# Patient Record
Sex: Male | Born: 1940 | Race: White | Hispanic: No | Marital: Married | Smoking: Former smoker
Health system: Southern US, Community
[De-identification: ages and names within clinical notes are randomized; demographics above are authoritative.]

## PROBLEM LIST (undated history)

## (undated) DIAGNOSIS — G8929 Other chronic pain: Secondary | ICD-10-CM

## (undated) DIAGNOSIS — R7303 Prediabetes: Secondary | ICD-10-CM

## (undated) DIAGNOSIS — R0609 Other forms of dyspnea: Secondary | ICD-10-CM

## (undated) DIAGNOSIS — K219 Gastro-esophageal reflux disease without esophagitis: Secondary | ICD-10-CM

## (undated) DIAGNOSIS — N529 Male erectile dysfunction, unspecified: Secondary | ICD-10-CM

## (undated) DIAGNOSIS — K580 Irritable bowel syndrome with diarrhea: Secondary | ICD-10-CM

## (undated) DIAGNOSIS — R3912 Poor urinary stream: Secondary | ICD-10-CM

## (undated) DIAGNOSIS — R51 Headache: Secondary | ICD-10-CM

## (undated) DIAGNOSIS — M75101 Unspecified rotator cuff tear or rupture of right shoulder, not specified as traumatic: Secondary | ICD-10-CM

## (undated) DIAGNOSIS — I5032 Chronic diastolic (congestive) heart failure: Secondary | ICD-10-CM

## (undated) DIAGNOSIS — R0789 Other chest pain: Secondary | ICD-10-CM

## (undated) DIAGNOSIS — K449 Diaphragmatic hernia without obstruction or gangrene: Secondary | ICD-10-CM

## (undated) DIAGNOSIS — H547 Unspecified visual loss: Secondary | ICD-10-CM

## (undated) DIAGNOSIS — C801 Malignant (primary) neoplasm, unspecified: Secondary | ICD-10-CM

## (undated) DIAGNOSIS — Z8669 Personal history of other diseases of the nervous system and sense organs: Secondary | ICD-10-CM

## (undated) DIAGNOSIS — J449 Chronic obstructive pulmonary disease, unspecified: Secondary | ICD-10-CM

## (undated) DIAGNOSIS — E782 Mixed hyperlipidemia: Secondary | ICD-10-CM

## (undated) DIAGNOSIS — I2510 Atherosclerotic heart disease of native coronary artery without angina pectoris: Secondary | ICD-10-CM

## (undated) DIAGNOSIS — Z974 Presence of external hearing-aid: Secondary | ICD-10-CM

## (undated) DIAGNOSIS — N400 Enlarged prostate without lower urinary tract symptoms: Secondary | ICD-10-CM

## (undated) DIAGNOSIS — M51379 Other intervertebral disc degeneration, lumbosacral region without mention of lumbar back pain or lower extremity pain: Secondary | ICD-10-CM

## (undated) DIAGNOSIS — Z85828 Personal history of other malignant neoplasm of skin: Secondary | ICD-10-CM

## (undated) DIAGNOSIS — M1990 Unspecified osteoarthritis, unspecified site: Secondary | ICD-10-CM

## (undated) DIAGNOSIS — E785 Hyperlipidemia, unspecified: Secondary | ICD-10-CM

## (undated) DIAGNOSIS — Z87828 Personal history of other (healed) physical injury and trauma: Secondary | ICD-10-CM

## (undated) DIAGNOSIS — C4490 Unspecified malignant neoplasm of skin, unspecified: Secondary | ICD-10-CM

## (undated) DIAGNOSIS — R9431 Abnormal electrocardiogram [ECG] [EKG]: Secondary | ICD-10-CM

## (undated) DIAGNOSIS — Z87898 Personal history of other specified conditions: Secondary | ICD-10-CM

## (undated) DIAGNOSIS — N401 Enlarged prostate with lower urinary tract symptoms: Secondary | ICD-10-CM

## (undated) DIAGNOSIS — R519 Headache, unspecified: Secondary | ICD-10-CM

## (undated) DIAGNOSIS — I509 Heart failure, unspecified: Secondary | ICD-10-CM

## (undated) DIAGNOSIS — Z860101 Personal history of adenomatous and serrated colon polyps: Secondary | ICD-10-CM

## (undated) DIAGNOSIS — M5137 Other intervertebral disc degeneration, lumbosacral region: Secondary | ICD-10-CM

## (undated) DIAGNOSIS — Z86010 Personal history of colonic polyps: Secondary | ICD-10-CM

## (undated) HISTORY — PX: KNEE ARTHROSCOPY: SUR90

## (undated) HISTORY — DX: Chronic obstructive pulmonary disease, unspecified: J44.9

## (undated) HISTORY — PX: OTHER SURGICAL HISTORY: SHX169

## (undated) HISTORY — DX: Unspecified visual loss: H54.7

## (undated) HISTORY — PX: ELBOW SURGERY: SHX618

## (undated) HISTORY — PX: CARDIAC CATHETERIZATION: SHX172

## (undated) HISTORY — PX: KNEE SURGERY: SHX244

## (undated) HISTORY — PX: CORONARY ARTERY BYPASS GRAFT: SHX141

## (undated) HISTORY — DX: Abnormal electrocardiogram (ECG) (EKG): R94.31

## (undated) HISTORY — PX: APPENDECTOMY: SHX54

## (undated) HISTORY — DX: Benign prostatic hyperplasia without lower urinary tract symptoms: N40.0

---
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
## 1986-09-16 DIAGNOSIS — Z87820 Personal history of traumatic brain injury: Secondary | ICD-10-CM

## 1986-09-16 HISTORY — DX: Personal history of traumatic brain injury: Z87.820

## 2001-09-16 HISTORY — PX: SHOULDER ARTHROSCOPY WITH OPEN ROTATOR CUFF REPAIR: SHX6092

## 2001-09-16 HISTORY — PX: SHOULDER ARTHROSCOPY WITH DISTAL CLAVICLE RESECTION: SHX5675

## 2010-09-16 HISTORY — PX: FINGER TENDON REPAIR: SHX1640

## 2011-11-11 DIAGNOSIS — Z0000 Encounter for general adult medical examination without abnormal findings: Secondary | ICD-10-CM | POA: Diagnosis not present

## 2011-11-13 DIAGNOSIS — I9589 Other hypotension: Secondary | ICD-10-CM | POA: Diagnosis not present

## 2011-11-13 DIAGNOSIS — R6889 Other general symptoms and signs: Secondary | ICD-10-CM | POA: Diagnosis not present

## 2011-11-13 DIAGNOSIS — Z0000 Encounter for general adult medical examination without abnormal findings: Secondary | ICD-10-CM | POA: Diagnosis not present

## 2011-11-13 DIAGNOSIS — R000 Tachycardia, unspecified: Secondary | ICD-10-CM | POA: Diagnosis not present

## 2011-11-13 DIAGNOSIS — F17200 Nicotine dependence, unspecified, uncomplicated: Secondary | ICD-10-CM | POA: Diagnosis not present

## 2011-11-13 DIAGNOSIS — Z23 Encounter for immunization: Secondary | ICD-10-CM | POA: Diagnosis not present

## 2011-11-19 DIAGNOSIS — Z87891 Personal history of nicotine dependence: Secondary | ICD-10-CM | POA: Diagnosis not present

## 2011-11-19 DIAGNOSIS — R6889 Other general symptoms and signs: Secondary | ICD-10-CM | POA: Diagnosis not present

## 2012-03-09 ENCOUNTER — Ambulatory Visit (INDEPENDENT_AMBULATORY_CARE_PROVIDER_SITE_OTHER): Payer: Federal, State, Local not specified - PPO | Admitting: Family Medicine

## 2012-03-09 ENCOUNTER — Encounter: Payer: Self-pay | Admitting: Family Medicine

## 2012-03-09 VITALS — BP 114/74 | HR 85 | Resp 16 | Ht 71.0 in | Wt 164.8 lb

## 2012-03-09 DIAGNOSIS — F17200 Nicotine dependence, unspecified, uncomplicated: Secondary | ICD-10-CM | POA: Diagnosis not present

## 2012-03-09 DIAGNOSIS — N400 Enlarged prostate without lower urinary tract symptoms: Secondary | ICD-10-CM | POA: Diagnosis not present

## 2012-03-09 DIAGNOSIS — Z720 Tobacco use: Secondary | ICD-10-CM

## 2012-03-09 DIAGNOSIS — H269 Unspecified cataract: Secondary | ICD-10-CM

## 2012-03-09 MED ORDER — TAMSULOSIN HCL 0.4 MG PO CAPS: 0.4000 mg | ORAL_CAPSULE | Freq: Every day | ORAL | Status: DC

## 2012-03-09 NOTE — Assessment & Plan Note (Signed)
Continue Flomax. I will obtain his records and labs for the past year from his previous physician

## 2012-03-09 NOTE — Assessment & Plan Note (Signed)
Referral to be made to ophthalmology.

## 2012-03-09 NOTE — Assessment & Plan Note (Signed)
He is not ready to quit tobacco. He states he has quit for a few months at a time using patches and other home remedies.

## 2012-03-09 NOTE — Patient Instructions (Signed)
Continue current medication  I will get records  F/U 1 year
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
## 2012-04-02 DIAGNOSIS — H4720 Unspecified optic atrophy: Secondary | ICD-10-CM | POA: Diagnosis not present

## 2012-04-02 DIAGNOSIS — H2589 Other age-related cataract: Secondary | ICD-10-CM | POA: Diagnosis not present

## 2012-04-02 DIAGNOSIS — H5200 Hypermetropia, unspecified eye: Secondary | ICD-10-CM | POA: Diagnosis not present

## 2012-04-02 DIAGNOSIS — H52229 Regular astigmatism, unspecified eye: Secondary | ICD-10-CM | POA: Diagnosis not present

## 2012-07-01 ENCOUNTER — Ambulatory Visit (INDEPENDENT_AMBULATORY_CARE_PROVIDER_SITE_OTHER): Payer: Medicare Other

## 2012-07-01 DIAGNOSIS — Z23 Encounter for immunization: Secondary | ICD-10-CM

## 2012-07-20 DIAGNOSIS — H2589 Other age-related cataract: Secondary | ICD-10-CM | POA: Diagnosis not present

## 2012-07-20 DIAGNOSIS — H547 Unspecified visual loss: Secondary | ICD-10-CM | POA: Diagnosis not present

## 2012-07-20 DIAGNOSIS — S04049A Injury of visual cortex, unspecified eye, initial encounter: Secondary | ICD-10-CM | POA: Diagnosis not present

## 2012-07-20 DIAGNOSIS — D3130 Benign neoplasm of unspecified choroid: Secondary | ICD-10-CM | POA: Diagnosis not present

## 2012-07-21 ENCOUNTER — Encounter (HOSPITAL_COMMUNITY): Payer: Self-pay | Admitting: Pharmacy Technician

## 2012-07-22 NOTE — Patient Instructions (Signed)
Your procedure is scheduled on:  07/27/12  Report to [facility] at 08:00 AM.  Call this number if you have problems the morning of surgery: [phone]   Remember:   Do not eat or drink:After Midnight.  Take these medicines the morning of surgery with A SIP OF WATER: Flomax.   Do not wear jewelry, make-up or nail polish.  Do not wear lotions, powders, or perfumes. You may wear deodorant.  Do not shave 48 hours prior to surgery. Men may shave face and neck.  Do not bring valuables to the hospital.  Contacts, dentures or bridgework may not be worn into surgery.  Leave suitcase in the car. After surgery it may be brought to your room.  For patients admitted to the hospital, checkout time is 11:00 AM the day of discharge.   Patients discharged the day of surgery will not be allowed to drive home.    Special Instructions: Start using your eye drops before surgery as directed by your eye doctor.   Please read over the following fact sheets that you were given: Anesthesia Post-op Instructions    Cataract Surgery  A cataract is a clouding of the lens of the eye. When a lens becomes cloudy, vision is reduced based on the degree and nature of the clouding. Surgery may be needed to improve vision. Surgery removes the cloudy lens and usually replaces it with a substitute lens (intraocular lens, IOL). LET YOUR EYE DOCTOR KNOW ABOUT:  Allergies to food or medicine.  Medicines taken including herbs, eyedrops, over-the-counter medicines, and creams.  Use of steroids (by mouth or creams).  Previous problems with anesthetics or numbing medicine.  History of bleeding problems or blood clots.  Previous surgery.  Other health problems, including diabetes and kidney problems.  Possibility of pregnancy, if this applies. RISKS AND COMPLICATIONS  Infection.  Inflammation of the eyeball (endophthalmitis) that can spread to both eyes (sympathetic ophthalmia).  Poor wound healing.  If an IOL is  inserted, it can later fall out of proper position. This is very uncommon.  Clouding of the part of your eye that holds an IOL in place. This is called an "after-cataract." These are uncommon, but easily treated. BEFORE THE PROCEDURE  Do not eat or drink anything except small amounts of water for 8 to 12 before your surgery, or as directed by your caregiver.  Unless you are told otherwise, continue any eyedrops you have been prescribed.  Talk to your primary caregiver about all other medicines that you take (both prescription and non-prescription). In some cases, you may need to stop or change medicines near the time of your surgery. This is most important if you are taking blood-thinning medicine.Do not stop medicines unless you are told to do so.  Arrange for someone to drive you to and from the procedure.  Do not put contact lenses in either eye on the day of your surgery. PROCEDURE There is more than one method for safely removing a cataract. Your doctor can explain the differences and help determine which is best for you. Phacoemulsification surgery is the most common form of cataract surgery.  An injection is given behind the eye or eyedrops are given to make this a painless procedure.  A small cut (incision) is made on the edge of the clear, dome-shaped surface that covers the front of the eye (cornea).  A tiny probe is painlessly inserted into the eye. This device gives off ultrasound waves that soften and break up the cloudy  center of the lens. This makes it easier for the cloudy lens to be removed by suction.  An IOL may be implanted.  The normal lens of the eye is covered by a clear capsule. Part of that capsule is intentionally left in the eye to support the IOL.  Your surgeon may or may not use stitches to close the incision. There are other forms of cataract surgery that require a larger incision and stiches to close the eye. This approach is taken in cases where the  doctor feels that the cataract cannot be easily removed using phacoemulsification. AFTER THE PROCEDURE  When an IOL is implanted, it does not need care. It becomes a permanent part of your eye and cannot be seen or felt.  Your doctor will schedule follow-up exams to check on your progress.  Review your other medicines with your doctor to see which can be resumed after surgery.  Use eyedrops or take medicine as prescribed by your doctor. Document Released: 08/22/2011 Document Revised: 11/25/2011 Document Reviewed: 08/22/2011 [facility] Patient Information 2013 [place], [place].    PATIENT INSTRUCTIONS POST-ANESTHESIA  IMMEDIATELY FOLLOWING SURGERY:  Do not drive or operate machinery for the first twenty four hours after surgery.  Do not make any important decisions for twenty four hours after surgery or while taking narcotic pain medications or sedatives.  If you develop intractable nausea and vomiting or a severe headache please notify your doctor immediately.  FOLLOW-UP:  Please make an appointment with your surgeon as instructed. You do not need to follow up with anesthesia unless specifically instructed to do so.  WOUND CARE INSTRUCTIONS (if applicable):  Keep a dry clean dressing on the anesthesia/puncture wound site if there is drainage.  Once the wound has quit draining you may leave it open to air.  Generally you should leave the bandage intact for twenty four hours unless there is drainage.  If the epidural site drains for more than 36-48 hours please call the anesthesia department.  QUESTIONS?:  Please feel free to call your physician or the hospital operator if you have any questions, and they will be happy to assist you.

## 2012-07-23 ENCOUNTER — Encounter (HOSPITAL_COMMUNITY): Payer: Self-pay

## 2012-07-23 ENCOUNTER — Encounter (HOSPITAL_COMMUNITY)
Admission: RE | Admit: 2012-07-23 | Discharge: 2012-07-23 | Disposition: A | Discharge status: Home / Self Care | Payer: Federal, State, Local not specified - PPO | Source: Ambulatory Visit | Attending: Ophthalmology | Admitting: Ophthalmology

## 2012-07-23 HISTORY — DX: Headache: R51

## 2012-07-23 HISTORY — DX: Unspecified osteoarthritis, unspecified site: M19.90

## 2012-07-23 LAB — BASIC METABOLIC PANEL
BUN: 15 mg/dL (ref 6–23)
Calcium: 9.5 mg/dL (ref 8.4–10.5)
Creatinine, Ser: 0.89 mg/dL (ref 0.50–1.35)
GFR calc Af Amer: >90 mL/min (ref >90)
GFR calc non Af Amer: 85 mL/min — ABNORMAL LOW (ref >90)
Glucose, Bld: 130 mg/dL — ABNORMAL HIGH (ref 70–99)
Potassium: 4.3 mEq/L (ref 3.5–5.1)

## 2012-07-27 ENCOUNTER — Encounter (HOSPITAL_COMMUNITY): Payer: Self-pay | Admitting: *Deleted

## 2012-07-27 ENCOUNTER — Ambulatory Visit (HOSPITAL_COMMUNITY)
Admission: RE | Admit: 2012-07-27 | Discharge: 2012-07-27 | Disposition: A | Discharge status: Home / Self Care | Payer: Federal, State, Local not specified - PPO | Source: Ambulatory Visit | Attending: Ophthalmology | Admitting: Ophthalmology

## 2012-07-27 ENCOUNTER — Ambulatory Visit (HOSPITAL_COMMUNITY): Payer: Federal, State, Local not specified - PPO | Admitting: Anesthesiology

## 2012-07-27 ENCOUNTER — Encounter (HOSPITAL_COMMUNITY)
Admission: RE | Disposition: A | Discharge status: Home / Self Care | Payer: Self-pay | Source: Ambulatory Visit | Attending: Ophthalmology

## 2012-07-27 ENCOUNTER — Encounter (HOSPITAL_COMMUNITY): Payer: Self-pay | Admitting: Anesthesiology

## 2012-07-27 DIAGNOSIS — H2589 Other age-related cataract: Secondary | ICD-10-CM | POA: Diagnosis not present

## 2012-07-27 DIAGNOSIS — H2181 Floppy iris syndrome: Secondary | ICD-10-CM | POA: Insufficient documentation

## 2012-07-27 DIAGNOSIS — Z01812 Encounter for preprocedural laboratory examination: Secondary | ICD-10-CM | POA: Insufficient documentation

## 2012-07-27 DIAGNOSIS — H52229 Regular astigmatism, unspecified eye: Secondary | ICD-10-CM | POA: Insufficient documentation

## 2012-07-27 DIAGNOSIS — H269 Unspecified cataract: Secondary | ICD-10-CM | POA: Diagnosis not present

## 2012-07-27 HISTORY — PX: CATARACT EXTRACTION W/PHACO: SHX586

## 2012-07-27 SURGERY — PHACOEMULSIFICATION, CATARACT, WITH IOL INSERTION
Anesthesia: Monitor Anesthesia Care | Site: Eye | Laterality: Right | Wound class: Clean

## 2012-07-27 MED ORDER — LIDOCAINE HCL (PF) 1 % IJ SOLN
INTRAMUSCULAR | Status: AC
  Filled 2012-07-27: qty 2

## 2012-07-27 MED ORDER — EPINEPHRINE HCL 1 MG/ML IJ SOLN
INTRAMUSCULAR | Status: AC
  Filled 2012-07-27: qty 1

## 2012-07-27 MED ORDER — LIDOCAINE 3.5 % OP GEL OPTIME - NO CHARGE
OPHTHALMIC | Status: DC | PRN
  Administered 2012-07-27: 2 [drp] via OPHTHALMIC

## 2012-07-27 MED ORDER — NEOMYCIN-POLYMYXIN-DEXAMETH 3.5-10000-0.1 OP OINT
TOPICAL_OINTMENT | OPHTHALMIC | Status: AC
  Filled 2012-07-27: qty 3.5

## 2012-07-27 MED ORDER — TETRACAINE HCL 0.5 % OP SOLN
1.0000 [drp] | OPHTHALMIC | Status: AC
  Administered 2012-07-27 (×3): 1 [drp] via OPHTHALMIC

## 2012-07-27 MED ORDER — BSS IO SOLN
INTRAOCULAR | Status: DC | PRN
  Administered 2012-07-27: 15 mL via INTRAOCULAR

## 2012-07-27 MED ORDER — PHENYLEPHRINE HCL 2.5 % OP SOLN
OPHTHALMIC | Status: AC
  Filled 2012-07-27: qty 2

## 2012-07-27 MED ORDER — LIDOCAINE HCL (PF) 1 % IJ SOLN
INTRAOCULAR | Status: DC | PRN
  Administered 2012-07-27: 10:00:00 via OPHTHALMIC

## 2012-07-27 MED ORDER — LACTATED RINGERS IV SOLN
INTRAVENOUS | Status: DC
  Administered 2012-07-27: 08:00:00 via INTRAVENOUS

## 2012-07-27 MED ORDER — LACTATED RINGERS IV SOLN: INTRAVENOUS | Status: DC

## 2012-07-27 MED ORDER — PROVISC 10 MG/ML IO SOLN
INTRAOCULAR | Status: DC | PRN
  Administered 2012-07-27: 8.5 mg via INTRAOCULAR

## 2012-07-27 MED ORDER — ACETAMINOPHEN 325 MG PO TABS: 650.0000 mg | ORAL_TABLET | Freq: Once | ORAL | Status: DC

## 2012-07-27 MED ORDER — LIDOCAINE HCL 3.5 % OP GEL
1.0000 "application " | Freq: Once | OPHTHALMIC | Status: AC
  Administered 2012-07-27: 1 via OPHTHALMIC

## 2012-07-27 MED ORDER — MIDAZOLAM HCL 2 MG/2ML IJ SOLN: 1.0000 mg | INTRAMUSCULAR | Status: DC | PRN

## 2012-07-27 MED ORDER — TETRACAINE HCL 0.5 % OP SOLN
OPHTHALMIC | Status: AC
  Filled 2012-07-27: qty 2

## 2012-07-27 MED ORDER — MIDAZOLAM HCL 2 MG/2ML IJ SOLN
1.0000 mg | INTRAMUSCULAR | Status: DC | PRN
  Administered 2012-07-27 (×2): 1 mg via INTRAVENOUS

## 2012-07-27 MED ORDER — CYCLOPENTOLATE-PHENYLEPHRINE 0.2-1 % OP SOLN
1.0000 [drp] | OPHTHALMIC | Status: AC
  Administered 2012-07-27 (×3): 1 [drp] via OPHTHALMIC

## 2012-07-27 MED ORDER — POVIDONE-IODINE 5 % OP SOLN
OPHTHALMIC | Status: DC | PRN
  Administered 2012-07-27: 1 via OPHTHALMIC

## 2012-07-27 MED ORDER — CYCLOPENTOLATE-PHENYLEPHRINE 0.2-1 % OP SOLN
OPHTHALMIC | Status: AC
  Filled 2012-07-27: qty 2

## 2012-07-27 MED ORDER — NEOMYCIN-POLYMYXIN-DEXAMETH 0.1 % OP OINT
TOPICAL_OINTMENT | OPHTHALMIC | Status: DC | PRN
  Administered 2012-07-27: 1 via OPHTHALMIC

## 2012-07-27 MED ORDER — EPINEPHRINE HCL 1 MG/ML IJ SOLN
INTRAOCULAR | Status: DC | PRN
  Administered 2012-07-27: 10:00:00

## 2012-07-27 MED ORDER — LIDOCAINE HCL 3.5 % OP GEL
OPHTHALMIC | Status: AC
  Filled 2012-07-27: qty 5

## 2012-07-27 MED ORDER — PHENYLEPHRINE HCL 2.5 % OP SOLN
1.0000 [drp] | OPHTHALMIC | Status: AC
  Administered 2012-07-27 (×3): 1 [drp] via OPHTHALMIC

## 2012-07-27 MED ORDER — ACETAMINOPHEN 325 MG PO TABS
ORAL_TABLET | ORAL | Status: AC
  Filled 2012-07-27: qty 2

## 2012-07-27 MED ORDER — MIDAZOLAM HCL 2 MG/2ML IJ SOLN
INTRAMUSCULAR | Status: AC
  Filled 2012-07-27: qty 2

## 2012-07-27 SURGICAL SUPPLY — 32 items
CAPSULAR TENSION RING-AMO (OPHTHALMIC RELATED) IMPLANT
CLOTH BEACON ORANGE TIMEOUT ST (SAFETY) ×2 IMPLANT
EYE SHIELD UNIVERSAL CLEAR (GAUZE/BANDAGES/DRESSINGS) ×2 IMPLANT
GLOVE BIO SURGEON STRL SZ 6.5 (GLOVE) IMPLANT
GLOVE BIOGEL PI IND STRL 6.5 (GLOVE) ×1 IMPLANT
GLOVE BIOGEL PI IND STRL 7.0 (GLOVE) IMPLANT
GLOVE BIOGEL PI IND STRL 7.5 (GLOVE) IMPLANT
GLOVE BIOGEL PI INDICATOR 6.5 (GLOVE) ×1
GLOVE BIOGEL PI INDICATOR 7.0 (GLOVE)
GLOVE BIOGEL PI INDICATOR 7.5 (GLOVE)
GLOVE ECLIPSE 6.5 STRL STRAW (GLOVE) IMPLANT
GLOVE ECLIPSE 7.0 STRL STRAW (GLOVE) IMPLANT
GLOVE ECLIPSE 7.5 STRL STRAW (GLOVE) IMPLANT
GLOVE EXAM NITRILE LRG STRL (GLOVE) IMPLANT
GLOVE EXAM NITRILE MD LF STRL (GLOVE) ×2 IMPLANT
GLOVE SKINSENSE NS SZ6.5 (GLOVE)
GLOVE SKINSENSE NS SZ7.0 (GLOVE)
GLOVE SKINSENSE STRL SZ6.5 (GLOVE) IMPLANT
GLOVE SKINSENSE STRL SZ7.0 (GLOVE) IMPLANT
KIT VITRECTOMY (OPHTHALMIC RELATED) IMPLANT
LENS SIGHTPATH STAAR TORIC ×2 IMPLANT
PAD ARMBOARD 7.5X6 YLW CONV (MISCELLANEOUS) ×2 IMPLANT
PROC W NO LENS (INTRAOCULAR LENS)
PROC W SPEC LENS (INTRAOCULAR LENS) ×2
PROCESS W NO LENS (INTRAOCULAR LENS) IMPLANT
PROCESS W SPEC LENS (INTRAOCULAR LENS) ×1 IMPLANT
RING MALYGIN (MISCELLANEOUS) IMPLANT
SYR TB 1ML LL NO SAFETY (SYRINGE) ×2 IMPLANT
TAPE SURG TRANSPORE 1 IN (GAUZE/BANDAGES/DRESSINGS) ×1 IMPLANT
TAPE SURGICAL TRANSPORE 1 IN (GAUZE/BANDAGES/DRESSINGS) ×1
VISCOELASTIC ADDITIONAL (OPHTHALMIC RELATED) IMPLANT
WATER STERILE IRR 250ML POUR (IV SOLUTION) ×2 IMPLANT

## 2012-07-27 NOTE — Transfer of Care (Signed)
Immediate Anesthesia Transfer of Care Note  Patient: Benjamin [name]  Procedure(s) Performed: Procedure(s) (LRB) with comments: CATARACT EXTRACTION PHACO AND INTRAOCULAR LENS PLACEMENT (IOC) (Right) - CDE: 12.55  Patient Location: PACU and Short Stay  Anesthesia Type:MAC  Level of Consciousness: awake  Airway & Oxygen Therapy: Patient Spontanous Breathing  Post-op Assessment: Report given to PACU RN  Post vital signs: Reviewed  Complications: No apparent anesthesia complications

## 2012-07-27 NOTE — Anesthesia Procedure Notes (Signed)
Procedure Name: MAC Date/Time: 07/27/2012 9:47 AM Performed by: Franco Nones Pre-anesthesia Checklist: Patient identified, Emergency Drugs available, Suction available, Timeout performed and Patient being monitored Patient Re-evaluated:Patient Re-evaluated prior to inductionOxygen Delivery Method: Nasal Cannula

## 2012-07-27 NOTE — Anesthesia Postprocedure Evaluation (Signed)
  Anesthesia Post-op Note  Patient: Benjamin [name]  Procedure(s) Performed: Procedure(s) (LRB) with comments: CATARACT EXTRACTION PHACO AND INTRAOCULAR LENS PLACEMENT (IOC) (Right) - CDE: 12.55  Patient Location: PACU and Short Stay  Anesthesia Type:MAC  Level of Consciousness: awake and alert   Airway and Oxygen Therapy: Patient Spontanous Breathing  Post-op Pain: none  Post-op Assessment: Post-op Vital signs reviewed, Patient's Cardiovascular Status Stable, Respiratory Function Stable, Patent Airway and No signs of Nausea or vomiting  Post-op Vital Signs: Reviewed and stable  Complications: No apparent anesthesia complications

## 2012-07-27 NOTE — Op Note (Signed)
[name], [name] NO.:  [id number]  MEDICAL RECORD NO.:  [id number]  LOCATION:  [facility]                          FACILITY:  [facility]  PHYSICIAN:  Susanne Greenhouse, MD       DATE OF BIRTH:  06/13/1941  DATE OF PROCEDURE:  07/27/2012 DATE OF DISCHARGE:  07/27/2012                              OPERATIVE REPORT   PREOPERATIVE DIAGNOSES:  Combined cataract, right eye, diagnosis code 366.19.  Stigmatism, diagnosis code 367.21.  POSTOPERATIVE DIAGNOSES:  Combined cataract, right eye, diagnosis code 366.19.  Stigmatism, diagnosis code 367.21.  Intraoperative floppy iris syndrome.  OPERATION PERFORMED:  Phacoemulsification with posterior chamber intraocular lens implantation, right eye.  SURGEON:  Bonne Dolores. Tywanda Rice, MD  ANESTHESIA:  Topical with monitored anesthesia care.  OPERATIVE SUMMARY:  In the preoperative area, dilating drops were placed into the right eye.  The patient was then brought into the operating room where he was placed under general anesthesia.  The eye was then prepped and draped.  Beginning with a 75 blade, a paracentesis port was made at the surgeon's 2 o'clock position.  The anterior chamber was then filled with a 1% nonpreserved lidocaine solution with epinephrine.  This was followed by Viscoat to deepen the chamber.  A small fornix-based peritomy was performed superiorly.  Next, a single iris hook was placed through the limbus superiorly.  A 2.4-mm keratome blade was then used to make a clear corneal incision over the iris hook.  A bent cystotome needle and Utrata forceps were used to create a continuous tear capsulotomy.  Hydrodissection was performed using balanced salt solution on a fine cannula.  The lens nucleus was then removed using phacoemulsification in a quadrant cracking technique.  The cortical material was then removed with irrigation and aspiration.  The capsular bag and anterior chamber were refilled with Provisc.  The wound was widened  to approximately 3 mm and a posterior chamber intraocular lens was placed into the capsular bag without difficulty using an [employer] lens injecting system.  A single 10-0 nylon suture was then used to close the incision as well as stromal hydration.  The Provisc was removed from the anterior chamber and capsular bag with irrigation and aspiration.  At this point, the wounds were tested for leak, which were negative.  The anterior chamber remained deep and stable.  The patient tolerated the procedure well.  There were no operative complications, and he awoke from general anesthesia without problem.  Toric intraocular lens was placed at the 54 and 234-degree meridians.  There were no surgical specimens.  Prosthetic device used is a STAAR Toric posterior chamber lens, model [id number], power is 21.5, spherical equivalent with a +2 Toric add.          ______________________________ Susanne Greenhouse, MD     KEH/MEDQ  D:  07/27/2012  T:  07/27/2012  Job:  [id number]

## 2012-07-27 NOTE — Brief Op Note (Signed)
Pre-Op Dx: Cataract OD Post-Op Dx: Cataract OD Surgeon: Gemma Payor Anesthesia: Topical with MAC Surgery: Cataract Extraction with Intraocular lens Implant OD Implant:  Staar, Toric 21.5/+2.0 @ 54/234 meridians. Blood Loss: None Specimen: None Complications: None

## 2012-07-27 NOTE — H&P (Signed)
I have reviewed the H&P, the patient was re-examined, and I have identified no interval changes in medical condition and plan of care since the history and physical of record  

## 2012-07-27 NOTE — Anesthesia Preprocedure Evaluation (Addendum)
Anesthesia Evaluation  Patient identified by MRN, date of birth, ID band Patient awake    Reviewed: Allergy & Precautions, H&P , NPO status , Patient's Chart, lab work & pertinent test results  Airway Mallampati: II      Dental  (+) Edentulous Upper and Edentulous Lower   Pulmonary Current Smoker,  breath sounds clear to auscultation        Cardiovascular negative cardio ROS  Rhythm:Regular Rate:Normal     Neuro/Psych  Headaches,    GI/Hepatic negative GI ROS,   Endo/Other    Renal/GU      Musculoskeletal   Abdominal   Peds  Hematology   Anesthesia Other Findings   Reproductive/Obstetrics                           Anesthesia Physical Anesthesia Plan  ASA: II  Anesthesia Plan: MAC   Post-op Pain Management:    Induction: Intravenous  Airway Management Planned: Nasal Cannula  Additional Equipment:   Intra-op Plan:   Post-operative Plan:   Informed Consent: I have reviewed the patients History and Physical, chart, labs and discussed the procedure including the risks, benefits and alternatives for the proposed anesthesia with the patient or authorized representative who has indicated his/her understanding and acceptance.     Plan Discussed with:   Anesthesia Plan Comments:         Anesthesia Quick Evaluation

## 2012-07-30 ENCOUNTER — Encounter (HOSPITAL_COMMUNITY): Payer: Self-pay | Admitting: Ophthalmology

## 2012-10-05 ENCOUNTER — Ambulatory Visit (INDEPENDENT_AMBULATORY_CARE_PROVIDER_SITE_OTHER): Payer: Medicare Other | Admitting: Family Medicine

## 2012-10-05 ENCOUNTER — Encounter: Payer: Self-pay | Admitting: Family Medicine

## 2012-10-05 VITALS — BP 108/80 | HR 79 | Temp 97.8°F | Resp 16 | Ht 71.0 in | Wt 160.0 lb

## 2012-10-05 DIAGNOSIS — J40 Bronchitis, not specified as acute or chronic: Secondary | ICD-10-CM | POA: Diagnosis not present

## 2012-10-05 MED ORDER — AZITHROMYCIN 250 MG PO TABS: ORAL_TABLET | ORAL | Status: AC

## 2012-10-05 MED ORDER — GUAIFENESIN-CODEINE 100-10 MG/5ML PO SYRP: 5.0000 mL | ORAL_SOLUTION | Freq: Three times a day (TID) | ORAL | Status: DC | PRN

## 2012-10-05 NOTE — Patient Instructions (Addendum)
Start antibiotics- Zpak  Cough syrup given  Call if you do not improve

## 2012-10-06 ENCOUNTER — Encounter: Payer: Self-pay | Admitting: Family Medicine

## 2012-10-06 DIAGNOSIS — J40 Bronchitis, not specified as acute or chronic: Secondary | ICD-10-CM | POA: Insufficient documentation

## 2012-10-06 NOTE — Progress Notes (Signed)
  Subjective:    Patient ID: Benjamin [name], male    DOB: 08/31/1941, 72 y.o.   MRN: [id number]  HPI  Worsening cough with little production for the past week and a half is what wife has been sick with bronchitis. He denies any fever or chills has a lot of pain from coughing and occasionally feels short of breath when he tries to walk short distances. He denies any wheezing denies any chest pain. He's been using over-the-counter Tylenol Cold and flu which is helps some.  Review of Systems  - per above    GEN- + fatigue, fever, weight loss,weakness, recent illness HEENT- denies eye drainage, change in vision, nasal discharge, CVS- denies chest pain, palpitations RESP- + SOB,+ cough, wheeze ABD- denies N/V, change in stools, abd pain Neuro- denies headache, dizziness, syncope, seizure activity      Objective:   Physical Exam  GEN- NAD, alert and oriented x3 HEENT- PERRL, EOMI, non injected sclera, pink conjunctiva, MMM, oropharynx clear, TM clear bilat no effusion, no maxillary sinus tenderness Neck- Supple, No LAD CVS- RRR, no murmur, chest wall non tender RESP-CTAB, with some upper airway congestion that clears  EXT- No edema Pulses- Radial +        Assessment & Plan:

## 2012-10-06 NOTE — Assessment & Plan Note (Signed)
Will cover for acute bronchitis as he is a smoker for many years and has had prolonged symptoms no sick contacts. He's given azithromycin and a cough syrup

## 2012-10-20 DIAGNOSIS — Z961 Presence of intraocular lens: Secondary | ICD-10-CM | POA: Diagnosis not present

## 2012-10-20 DIAGNOSIS — H5231 Anisometropia: Secondary | ICD-10-CM | POA: Diagnosis not present

## 2012-10-30 ENCOUNTER — Telehealth: Payer: Self-pay | Admitting: Family Medicine

## 2012-10-30 ENCOUNTER — Encounter (HOSPITAL_COMMUNITY): Payer: Self-pay | Admitting: *Deleted

## 2012-10-30 ENCOUNTER — Emergency Department (HOSPITAL_COMMUNITY)
Admission: EM | Admit: 2012-10-30 | Discharge: 2012-10-30 | Disposition: A | Discharge status: Home / Self Care | Payer: Medicare Other | Attending: Emergency Medicine | Admitting: Emergency Medicine

## 2012-10-30 DIAGNOSIS — S6990XA Unspecified injury of unspecified wrist, hand and finger(s), initial encounter: Secondary | ICD-10-CM | POA: Diagnosis not present

## 2012-10-30 DIAGNOSIS — Y939 Activity, unspecified: Secondary | ICD-10-CM | POA: Insufficient documentation

## 2012-10-30 DIAGNOSIS — Z23 Encounter for immunization: Secondary | ICD-10-CM | POA: Diagnosis not present

## 2012-10-30 DIAGNOSIS — Z8739 Personal history of other diseases of the musculoskeletal system and connective tissue: Secondary | ICD-10-CM | POA: Diagnosis not present

## 2012-10-30 DIAGNOSIS — F17200 Nicotine dependence, unspecified, uncomplicated: Secondary | ICD-10-CM | POA: Insufficient documentation

## 2012-10-30 DIAGNOSIS — N400 Enlarged prostate without lower urinary tract symptoms: Secondary | ICD-10-CM | POA: Insufficient documentation

## 2012-10-30 DIAGNOSIS — W540XXA Bitten by dog, initial encounter: Secondary | ICD-10-CM | POA: Insufficient documentation

## 2012-10-30 DIAGNOSIS — Z79899 Other long term (current) drug therapy: Secondary | ICD-10-CM | POA: Insufficient documentation

## 2012-10-30 DIAGNOSIS — Y92009 Unspecified place in unspecified non-institutional (private) residence as the place of occurrence of the external cause: Secondary | ICD-10-CM | POA: Insufficient documentation

## 2012-10-30 DIAGNOSIS — Z8669 Personal history of other diseases of the nervous system and sense organs: Secondary | ICD-10-CM | POA: Insufficient documentation

## 2012-10-30 DIAGNOSIS — S61409A Unspecified open wound of unspecified hand, initial encounter: Secondary | ICD-10-CM | POA: Diagnosis not present

## 2012-10-30 DIAGNOSIS — S61452A Open bite of left hand, initial encounter: Secondary | ICD-10-CM

## 2012-10-30 MED ORDER — AMOXICILLIN-POT CLAVULANATE 875-125 MG PO TABS: 1.0000 | ORAL_TABLET | Freq: Two times a day (BID) | ORAL | Status: DC

## 2012-10-30 MED ORDER — HYDROCODONE-ACETAMINOPHEN 5-325 MG PO TABS: 2.0000 | ORAL_TABLET | ORAL | Status: DC | PRN

## 2012-10-30 MED ORDER — TETANUS-DIPHTH-ACELL PERTUSSIS 5-2.5-18.5 LF-MCG/0.5 IM SUSP
0.5000 mL | Freq: Once | INTRAMUSCULAR | Status: AC
  Administered 2012-10-30: 0.5 mL via INTRAMUSCULAR
  Filled 2012-10-30: qty 0.5

## 2012-10-30 MED ORDER — NAPROXEN 500 MG PO TABS: 500.0000 mg | ORAL_TABLET | Freq: Two times a day (BID) | ORAL | Status: DC

## 2012-10-30 MED ORDER — AMOXICILLIN-POT CLAVULANATE 875-125 MG PO TABS
1.0000 | ORAL_TABLET | Freq: Once | ORAL | Status: AC
  Administered 2012-10-30: 1 via ORAL
  Filled 2012-10-30: qty 1

## 2012-10-30 MED ORDER — HYDROCODONE-ACETAMINOPHEN 5-325 MG PO TABS
2.0000 | ORAL_TABLET | Freq: Once | ORAL | Status: AC
  Administered 2012-10-30: 2 via ORAL
  Filled 2012-10-30: qty 2

## 2012-10-30 NOTE — ED Notes (Signed)
Bitten by dog on L hand.  Dog recently vaccinated, no rabies.  Center papule popped at home w/purulent drainage.  Since that time, radius of erythema has widened and is more tender and warm.  He use hot compress at home.  Has taken Tylenol for pain w/out relief.  Pain 10/10 at present.

## 2012-10-30 NOTE — ED Provider Notes (Addendum)
History     CSN: [id number]  Arrival date & time 10/30/12  1215   First MD Initiated Contact with Patient 10/30/12 1253      Chief Complaint  Patient presents with  . Animal Bite    (Consider location/radiation/quality/duration/timing/severity/associated sxs/prior treatment) HPI Comments: 72 year old male who presents 48 hours after being bitten on the dorsum of his left hand by a dog which was known to him. This dog is up-to-date on vaccinations including rabies. The pain was acute in onset but mild initially, it is gradually become worse and is now associated with some mild swelling and erythema. He does admit to having mild pain in his forearm and all the way up to her shoulder but no numbness, weakness or tingling. This pain is made worse with palpation and with making a fist with the left hand. The patient is left-handed. He also notes that a small puncture area to the dorsum of the left hand has been draining a small amount of purulent material today. He denies fevers or chills, nausea or vomiting. He is not a diabetic and he is not immunocompromised.  Patient is a 72 y.o. male presenting with animal bite. The history is provided by the patient.  Animal Bite     Past Medical History  Diagnosis Date  . Enlarged prostate   . Vision problems     Blind x 20 years, regained site 2000, ? optic nerve injury  . Abnormal EKG     History per report of ST elevations x 20 years, no cardiac history-anomale with aorta-sts "it always looks like i am having a heart atttack on my EKG  . Headache     last one 5 yrs ago  . Arthritis     Past Surgical History  Procedure Laterality Date  . Appendectomy      age 11  . Right shoulder    . Left elbow    . Left thumb    . Cataract extraction w/phaco  07/27/2012    Procedure: CATARACT EXTRACTION PHACO AND INTRAOCULAR LENS PLACEMENT (IOC);  Surgeon: Gemma Payor, MD;  Location: [facility];  Service: Ophthalmology;  Laterality: Right;  CDE: 12.55     Family History  Problem Relation Age of Onset  . Diabetes Mother   . COPD Mother   . Heart disease Mother     History  Substance Use Topics  . Smoking status: Current Every Day Smoker -- 0.50 packs/day for 50 years    Types: Cigarettes  . Smokeless tobacco: Not on file  . Alcohol Use: Yes     Comment: occassional      Review of Systems  All other systems reviewed and are negative.    Allergies  Arsenic and Contrast media  Home Medications   Current Outpatient Rx  Name  Route  Sig  Dispense  Refill  . Tamsulosin HCl (FLOMAX) 0.4 MG CAPS   Oral   Take 1 capsule (0.4 mg total) by mouth daily.   90 capsule   2   . amoxicillin-clavulanate (AUGMENTIN) 875-125 MG per tablet   Oral   Take 1 tablet by mouth every 12 (twelve) hours.   14 tablet   0   . HYDROcodone-acetaminophen (NORCO/VICODIN) 5-325 MG per tablet   Oral   Take 2 tablets by mouth every 4 (four) hours as needed for pain.   10 tablet   0   . naproxen (NAPROSYN) 500 MG tablet   Oral   Take 1 tablet (500 mg  total) by mouth 2 (two) times daily with a meal.   30 tablet   0     BP 128/88  Pulse 105  Temp(Src) 98.3 F (36.8 C) (Oral)  Resp 18  Ht 5\' 10"  (1.778 m)  Wt 165 lb (74.844 kg)  BMI 23.68 kg/m2  SpO2 96%  Physical Exam  Nursing note and vitals reviewed. Constitutional: He appears well-developed and well-nourished. No distress.  HENT:  Head: Normocephalic and atraumatic.  Mouth/Throat: Oropharynx is clear and moist. No oropharyngeal exudate.  Eyes: Conjunctivae and EOM are normal. Pupils are equal, round, and reactive to light. Right eye exhibits no discharge. Left eye exhibits no discharge. No scleral icterus.  Neck: Normal range of motion. Neck supple. No JVD present. No thyromegaly present.  Cardiovascular: Normal rate, regular rhythm, normal heart sounds and intact distal pulses.  Exam reveals no gallop and no friction rub.   No murmur heard. Normal pulses at the radial artery  on the left  Pulmonary/Chest: Effort normal and breath sounds normal. No respiratory distress. He has no wheezes. He has no rales.  Abdominal: Soft. Bowel sounds are normal. He exhibits no distension and no mass. There is no tenderness.  Musculoskeletal: He exhibits tenderness. He exhibits no edema.  Decreased range of motion of the left hand making a fist secondary to pain on the dorsum of the hand. Extensor tendons are all intact, there is no pain against resistance of the extensor tendons, minimal pain with resistance against flexor tendons. The erythema is located strictly on the dorsum of the hand it does not extend proximal to the wrist or distal to the MCPs. Minimal swelling to the dorsum of the hand  Lymphadenopathy:    He has no cervical adenopathy.  Neurological: He is alert. Coordination normal.  Normal sensation to light touch  Skin: Skin is warm and dry. No rash noted. There is erythema.  There are several very superficial abrasions and puncture wounds to the dorsum of the hand. The central dorsal lesion has a very small amount of discharge from it.  Psychiatric: He has a normal mood and affect. His behavior is normal.    ED Course  Procedures (including critical care time)  Labs Reviewed - No data to display No results found.   1. Dog bite of hand, left, initial encounter       MDM  Dog bite, signs of infection, very mild, no flexor or extensor tenosynovitis present on exam, no fever, there is some purulent drainage concerning for worsening infection. Antibiotics ordered, pain medicine, up-to-date tetanus, consult and surgery for followup.  Unable to get a hold of hand surgery, antibiotic started, pain medicine consultation stable for followup in the office, I have given him all of the instructions for return should his symptoms worsen. He has expressed his understanding.  Augmentin  Hydrocodone  Naprosyn  Dr. Butler Denmark was eventually reached, agrees with  plan    Vida Roller, MD 10/30/12 1411  Vida Roller, MD 10/30/12 [phone]

## 2012-10-30 NOTE — ED Notes (Signed)
Patient with no complaints at this time. Respirations even and unlabored. Skin warm/dry. Discharge instructions reviewed with patient at this time. Patient given opportunity to voice concerns/ask questions. Patient discharged at this time and left Emergency Department with steady gait.   

## 2012-11-02 NOTE — Telephone Encounter (Signed)
Noted  

## 2012-11-04 DIAGNOSIS — T148XXA Other injury of unspecified body region, initial encounter: Secondary | ICD-10-CM | POA: Diagnosis not present

## 2012-11-04 DIAGNOSIS — M19049 Primary osteoarthritis, unspecified hand: Secondary | ICD-10-CM | POA: Diagnosis not present

## 2012-11-12 ENCOUNTER — Ambulatory Visit (HOSPITAL_COMMUNITY)
Admission: RE | Admit: 2012-11-12 | Discharge: 2012-11-12 | Disposition: A | Discharge status: Home / Self Care | Payer: Medicare Other | Source: Ambulatory Visit | Attending: Family Medicine | Admitting: Family Medicine

## 2012-11-12 ENCOUNTER — Ambulatory Visit (INDEPENDENT_AMBULATORY_CARE_PROVIDER_SITE_OTHER): Payer: Medicare Other | Admitting: Family Medicine

## 2012-11-12 ENCOUNTER — Encounter: Payer: Self-pay | Admitting: Family Medicine

## 2012-11-12 VITALS — BP 102/68 | HR 86 | Resp 18 | Ht 71.0 in | Wt 163.0 lb

## 2012-11-12 DIAGNOSIS — M79605 Pain in left leg: Secondary | ICD-10-CM

## 2012-11-12 DIAGNOSIS — M545 Low back pain, unspecified: Secondary | ICD-10-CM | POA: Diagnosis not present

## 2012-11-12 DIAGNOSIS — M79609 Pain in unspecified limb: Secondary | ICD-10-CM | POA: Diagnosis not present

## 2012-11-12 DIAGNOSIS — M5137 Other intervertebral disc degeneration, lumbosacral region: Secondary | ICD-10-CM | POA: Insufficient documentation

## 2012-11-12 DIAGNOSIS — M51379 Other intervertebral disc degeneration, lumbosacral region without mention of lumbar back pain or lower extremity pain: Secondary | ICD-10-CM | POA: Insufficient documentation

## 2012-11-12 DIAGNOSIS — M25569 Pain in unspecified knee: Secondary | ICD-10-CM | POA: Insufficient documentation

## 2012-11-12 DIAGNOSIS — M25559 Pain in unspecified hip: Secondary | ICD-10-CM | POA: Diagnosis not present

## 2012-11-12 DIAGNOSIS — M25562 Pain in left knee: Secondary | ICD-10-CM

## 2012-11-12 DIAGNOSIS — M25552 Pain in left hip: Secondary | ICD-10-CM
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
## 2012-11-12 NOTE — Patient Instructions (Signed)
Get the xrays done  I will call with results and instructions

## 2012-11-13 DIAGNOSIS — M25559 Pain in unspecified hip: Secondary | ICD-10-CM | POA: Insufficient documentation

## 2012-11-13 DIAGNOSIS — M25569 Pain in unspecified knee: Secondary | ICD-10-CM | POA: Insufficient documentation

## 2012-11-13 NOTE — Assessment & Plan Note (Signed)
X-rays hip shows mild arthritis on the right side and a bone spur there was nothing to suggest reason for pain on the left side his lumbar spine x-ray also showed degenerative disc disease which was mild He was continue the anti-inflammatories for now

## 2012-11-13 NOTE — Assessment & Plan Note (Signed)
His thigh pain along with paresthesias in his foot are concerning for possible sciatica or other nerve problem x-rays were negative ultrasound was done after reviewing the x-rays which showed no evidence of blood clot

## 2013-01-25 ENCOUNTER — Other Ambulatory Visit: Payer: Self-pay | Admitting: Family Medicine

## 2013-02-11 ENCOUNTER — Ambulatory Visit: Payer: Self-pay | Admitting: Family Medicine

## 2013-02-17 ENCOUNTER — Encounter: Payer: Self-pay | Admitting: Family Medicine

## 2013-02-17 ENCOUNTER — Ambulatory Visit (INDEPENDENT_AMBULATORY_CARE_PROVIDER_SITE_OTHER): Payer: Medicare Other | Admitting: Family Medicine

## 2013-02-17 VITALS — BP 98/68 | HR 72 | Temp 97.7°F | Resp 18 | Ht 71.0 in | Wt 162.0 lb

## 2013-02-17 DIAGNOSIS — R209 Unspecified disturbances of skin sensation: Secondary | ICD-10-CM | POA: Diagnosis not present

## 2013-02-17 DIAGNOSIS — R7309 Other abnormal glucose: Secondary | ICD-10-CM | POA: Diagnosis not present

## 2013-02-17 DIAGNOSIS — R292 Abnormal reflex: Secondary | ICD-10-CM

## 2013-02-17 DIAGNOSIS — Z720 Tobacco use: Secondary | ICD-10-CM

## 2013-02-17 DIAGNOSIS — M25569 Pain in unspecified knee: Secondary | ICD-10-CM

## 2013-02-17 DIAGNOSIS — R7303 Prediabetes: Secondary | ICD-10-CM

## 2013-02-17 DIAGNOSIS — M25562 Pain in left knee: Secondary | ICD-10-CM

## 2013-02-17 DIAGNOSIS — F17200 Nicotine dependence, unspecified, uncomplicated: Secondary | ICD-10-CM

## 2013-02-17 DIAGNOSIS — E785 Hyperlipidemia, unspecified: Secondary | ICD-10-CM

## 2013-02-17 DIAGNOSIS — R202 Paresthesia of skin: Secondary | ICD-10-CM

## 2013-02-17 MED ORDER — GABAPENTIN 300 MG PO CAPS: 300.0000 mg | ORAL_CAPSULE | Freq: Every day | ORAL | Status: DC

## 2013-02-17 NOTE — Progress Notes (Signed)
  Subjective:    Patient ID: Benjamin [name], male    DOB: Sep 18, 1940, 72 y.o.   MRN: [id number]  HPI  Patient presents to followup left thigh and leg pain. He continues to have tingling and numbness in his left leg which is causing his leg to give out on him when he stands for the past 4-5 months. He feels very stiff even after sitting for short periods of time. The anti-inflammatories did not help very much. He's also tried acetaminophen which has not helped. He denies any change in his bowel or bladder. He occasionally has symptoms of run down into his feet in cause tingling as well. Denies any recent fall He is due for routine labs, had very mild hyperlipidemia in the past, he also tells me he was told he was borderline diabetic but no meds were taken Review of Systems  GEN- denies fatigue, fever, weight loss,weakness, recent illness HEENT- denies eye drainage, change in vision, nasal discharge, CVS- denies chest pain, palpitations RESP- denies SOB, cough, wheeze ABD- denies N/V, change in stools, abd pain GU- denies dysuria, hematuria, dribbling, incontinence MSK- +joint pain, muscle aches, injury Neuro- denies headache, dizziness, syncope, seizure activity      Objective:   Physical Exam  GEN- NAD, alert and oriented x3 Neck- Supple, normal ROM CVS- RRR, no murmur RESP-CTAB MSK-  Back- Spine NT, equivocal SLR- left side  Hip- no pain with IR/ER, fair ROM EXT- No edema Pulses- Radial, DP- 2+ Neuro- CNII-XII intact , DTR- 1+ Left patella and achilles , compared to right 2+, neg babinski              Motor- decreased strength LLE 4+/5 compared to Right 5/5, no foot drop      Assessment & Plan:

## 2013-02-17 NOTE — Patient Instructions (Addendum)
Gabapentin to be started at bedtime- take at 9pm Get the labs done fasting  MRI to be set up, I will call with results and discuss Physical therapy F/U as needed or in 6 months for routine

## 2013-02-18 DIAGNOSIS — R202 Paresthesia of skin: Secondary | ICD-10-CM | POA: Insufficient documentation

## 2013-02-18 DIAGNOSIS — R292 Abnormal reflex: Secondary | ICD-10-CM | POA: Insufficient documentation

## 2013-02-18 DIAGNOSIS — R7303 Prediabetes: Secondary | ICD-10-CM | POA: Insufficient documentation

## 2013-02-18 DIAGNOSIS — E785 Hyperlipidemia, unspecified: Secondary | ICD-10-CM | POA: Insufficient documentation

## 2013-02-18 NOTE — Assessment & Plan Note (Signed)
His symptoms are not typical of sciatica, with weakness and and the neuropathic changes and changes in DTR will send him for MRI L spine, he may have some nerve root irritation Will start on gabapentin at bedtime, low dose due to age

## 2013-02-18 NOTE — Assessment & Plan Note (Signed)
Concern with decrease reflexes on exam, though neg babinski

## 2013-02-18 NOTE — Assessment & Plan Note (Signed)
Per above MRI of back

## 2013-02-18 NOTE — Assessment & Plan Note (Signed)
LDL 124  2013, no CAD or PVD , recheck

## 2013-02-18 NOTE — Assessment & Plan Note (Signed)
Per pt though I can not find in his records, A1C to be checked with fasting labs

## 2013-02-18 NOTE — Assessment & Plan Note (Signed)
He is not reading to quit smoking

## 2013-02-22 DIAGNOSIS — E785 Hyperlipidemia, unspecified: Secondary | ICD-10-CM | POA: Diagnosis not present

## 2013-02-22 DIAGNOSIS — R7309 Other abnormal glucose: Secondary | ICD-10-CM | POA: Diagnosis not present

## 2013-02-22 LAB — CBC
HCT: 43.4 % (ref 39.0–52.0)
MCH: 31.5 pg (ref 26.0–34.0)
MCV: 91.2 fL (ref 78.0–100.0)
Platelets: 312 10*3/uL (ref 150–400)
RBC: 4.76 MIL/uL (ref 4.22–5.81)
WBC: 6.5 10*3/uL (ref 4.0–10.5)

## 2013-02-22 LAB — LIPID PANEL
HDL: 48 mg/dL (ref >39)
LDL Cholesterol: 116 mg/dL — ABNORMAL HIGH (ref 0–99)
Total CHOL/HDL Ratio: 3.6 Ratio
Triglycerides: 52 mg/dL (ref <150)

## 2013-02-22 LAB — COMPREHENSIVE METABOLIC PANEL
ALT: 11 U/L (ref 0–53)
Alkaline Phosphatase: 43 U/L (ref 39–117)
Creat: 0.89 mg/dL (ref 0.50–1.35)
Sodium: 143 mEq/L (ref 135–145)
Total Bilirubin: 1 mg/dL (ref 0.3–1.2)
Total Protein: 6.5 g/dL (ref 6.0–8.3)

## 2013-02-23 ENCOUNTER — Ambulatory Visit (HOSPITAL_COMMUNITY)
Admission: RE | Admit: 2013-02-23 | Discharge: 2013-02-23 | Disposition: A | Discharge status: Home / Self Care | Payer: Medicare Other | Source: Ambulatory Visit | Attending: Family Medicine | Admitting: Family Medicine

## 2013-02-23 DIAGNOSIS — M47817 Spondylosis without myelopathy or radiculopathy, lumbosacral region: Secondary | ICD-10-CM | POA: Diagnosis not present

## 2013-02-23 DIAGNOSIS — M545 Low back pain, unspecified: Secondary | ICD-10-CM | POA: Diagnosis not present

## 2013-02-23 DIAGNOSIS — R292 Abnormal reflex: Secondary | ICD-10-CM

## 2013-02-23 DIAGNOSIS — M48061 Spinal stenosis, lumbar region without neurogenic claudication: Secondary | ICD-10-CM | POA: Diagnosis not present

## 2013-02-23 DIAGNOSIS — R209 Unspecified disturbances of skin sensation: Secondary | ICD-10-CM | POA: Diagnosis not present

## 2013-02-23 DIAGNOSIS — M25562 Pain in left knee: Secondary | ICD-10-CM

## 2013-02-23 DIAGNOSIS — R202 Paresthesia of skin: Secondary | ICD-10-CM

## 2013-02-23 DIAGNOSIS — M5137 Other intervertebral disc degeneration, lumbosacral region: Secondary | ICD-10-CM | POA: Diagnosis not present

## 2013-02-25 ENCOUNTER — Other Ambulatory Visit: Payer: Self-pay | Admitting: Family Medicine

## 2013-02-25 DIAGNOSIS — M48061 Spinal stenosis, lumbar region without neurogenic claudication: Secondary | ICD-10-CM

## 2013-02-25 DIAGNOSIS — R202 Paresthesia of skin: Secondary | ICD-10-CM

## 2013-02-25 DIAGNOSIS — M47816 Spondylosis without myelopathy or radiculopathy, lumbar region: Secondary | ICD-10-CM

## 2013-03-05 ENCOUNTER — Encounter (HOSPITAL_COMMUNITY): Payer: Self-pay | Admitting: *Deleted

## 2013-03-05 ENCOUNTER — Emergency Department (HOSPITAL_COMMUNITY)
Admission: EM | Admit: 2013-03-05 | Discharge: 2013-03-05 | Disposition: A | Discharge status: Home / Self Care | Payer: Medicare Other | Attending: Emergency Medicine | Admitting: Emergency Medicine

## 2013-03-05 DIAGNOSIS — Z79899 Other long term (current) drug therapy: Secondary | ICD-10-CM | POA: Diagnosis not present

## 2013-03-05 DIAGNOSIS — F17200 Nicotine dependence, unspecified, uncomplicated: Secondary | ICD-10-CM | POA: Insufficient documentation

## 2013-03-05 DIAGNOSIS — S90569A Insect bite (nonvenomous), unspecified ankle, initial encounter: Secondary | ICD-10-CM | POA: Insufficient documentation

## 2013-03-05 DIAGNOSIS — Z8669 Personal history of other diseases of the nervous system and sense organs: Secondary | ICD-10-CM | POA: Insufficient documentation

## 2013-03-05 DIAGNOSIS — Z87448 Personal history of other diseases of urinary system: Secondary | ICD-10-CM | POA: Diagnosis not present

## 2013-03-05 DIAGNOSIS — R21 Rash and other nonspecific skin eruption: Secondary | ICD-10-CM | POA: Diagnosis not present

## 2013-03-05 DIAGNOSIS — W57XXXA Bitten or stung by nonvenomous insect and other nonvenomous arthropods, initial encounter: Secondary | ICD-10-CM

## 2013-03-05 DIAGNOSIS — Z8739 Personal history of other diseases of the musculoskeletal system and connective tissue: Secondary | ICD-10-CM | POA: Insufficient documentation

## 2013-03-05 DIAGNOSIS — Y9389 Activity, other specified: Secondary | ICD-10-CM | POA: Insufficient documentation

## 2013-03-05 DIAGNOSIS — H547 Unspecified visual loss: Secondary | ICD-10-CM | POA: Diagnosis not present

## 2013-03-05 DIAGNOSIS — T6391XA Toxic effect of contact with unspecified venomous animal, accidental (unintentional), initial encounter: Secondary | ICD-10-CM | POA: Diagnosis not present

## 2013-03-05 DIAGNOSIS — Y9289 Other specified places as the place of occurrence of the external cause: Secondary | ICD-10-CM | POA: Insufficient documentation

## 2013-03-05 MED ORDER — BACITRACIN ZINC 500 UNIT/GM EX OINT
TOPICAL_OINTMENT | CUTANEOUS | Status: AC
  Administered 2013-03-05: 1
  Filled 2013-03-05: qty 0.9

## 2013-03-05 MED ORDER — TRIAMCINOLONE ACETONIDE 0.1 % EX CREA: TOPICAL_CREAM | Freq: Three times a day (TID) | CUTANEOUS | Status: DC

## 2013-03-05 MED ORDER — SULFAMETHOXAZOLE-TRIMETHOPRIM 800-160 MG PO TABS: 1.0000 | ORAL_TABLET | Freq: Two times a day (BID) | ORAL | Status: DC

## 2013-03-05 MED ORDER — SULFAMETHOXAZOLE-TMP DS 800-160 MG PO TABS
1.0000 | ORAL_TABLET | Freq: Once | ORAL | Status: AC
Start: 2013-03-05 — End: 2013-03-05
  Administered 2013-03-05: 1 via ORAL
  Filled 2013-03-05: qty 1

## 2013-03-05 NOTE — ED Notes (Signed)
Insect bite to left lower leg, Quarter size red area present.

## 2013-03-05 NOTE — ED Notes (Signed)
Pt has an insect bite on left leg. Pt thinks it was a spider.

## 2013-03-05 NOTE — ED Provider Notes (Signed)
History     CSN: [id number]  Arrival date & time 03/05/13  2118   First MD Initiated Contact with Patient 03/05/13 2128      Chief Complaint  Patient presents with  . Insect Bite    (Consider location/radiation/quality/duration/timing/severity/associated sxs/prior treatment) HPI Comments: Benjamin [name] is a 72 y.o. male who presents to the Emergency Department complaining of red, painful, itching lesion to the left lower leg.  States that he may have been bitten by a "brown recluse".  Noticed the symptoms yesterday.  He has been applying neosporin daily.  He denies open wound, other skin lesions, fever, joint pains or swelling.  Pain or itching is not affected by walking or standing  The history is provided by the patient.    Past Medical History  Diagnosis Date  . Enlarged prostate   . Vision problems     Blind x 20 years, regained site 2000, ? optic nerve injury  . Abnormal EKG     History per report of ST elevations x 20 years, no cardiac history-anomale with aorta-sts "it always looks like i am having a heart atttack on my EKG  . Headache(784.0)     last one 5 yrs ago  . Arthritis     Past Surgical History  Procedure Laterality Date  . Appendectomy      age 34  . Right shoulder    . Left elbow    . Left thumb    . Cataract extraction w/phaco  07/27/2012    Procedure: CATARACT EXTRACTION PHACO AND INTRAOCULAR LENS PLACEMENT (IOC);  Surgeon: Gemma Payor, MD;  Location: [facility];  Service: Ophthalmology;  Laterality: Right;  CDE: 12.55    Family History  Problem Relation Age of Onset  . Diabetes Mother   . COPD Mother   . Heart disease Mother     History  Substance Use Topics  . Smoking status: Current Every Day Smoker -- 0.50 packs/day for 50 years    Types: Cigarettes  . Smokeless tobacco: Not on file  . Alcohol Use: Yes     Comment: occassional      Review of Systems  Constitutional: Negative for fever, chills, activity change and appetite change.  HENT:  Negative for sore throat, facial swelling, trouble swallowing, neck pain and neck stiffness.   Respiratory: Negative for chest tightness, shortness of breath and wheezing.   Gastrointestinal: Negative for nausea and vomiting.  Musculoskeletal: Negative for joint swelling, arthralgias and gait problem.  Skin: Positive for rash. Negative for wound.       Spider bite  Neurological: Negative for dizziness, weakness, numbness and headaches.  All other systems reviewed and are negative.    Allergies  Arsenic and Contrast media  Home Medications   Current Outpatient Rx  Name  Route  Sig  Dispense  Refill  . acetaminophen (TYLENOL) 500 MG tablet   Oral   Take 500 mg by mouth every 6 (six) hours as needed for pain.         Marland Kitchen gabapentin (NEURONTIN) 300 MG capsule   Oral   Take 1 capsule (300 mg total) by mouth at bedtime.   30 capsule   3   . tamsulosin (FLOMAX) 0.4 MG CAPS      TAKE 1 CAPSULE BY MOUTH DAILY   90 capsule   0     BP 125/81  Pulse 96  Temp(Src) 97.4 F (36.3 C)  Resp 20  Ht 5\' 11"  (1.803 m)  Wt 165 lb (  74.844 kg)  BMI 23.02 kg/m2  SpO2 96%  Physical Exam  Nursing note and vitals reviewed. Constitutional: He is oriented to person, place, and time. He appears well-developed and well-nourished. No distress.  HENT:  Head: Normocephalic and atraumatic.  Cardiovascular: Normal rate, regular rhythm, normal heart sounds and intact distal pulses.   No murmur heard. Pulmonary/Chest: Effort normal and breath sounds normal. No respiratory distress.  Musculoskeletal: Normal range of motion. He exhibits no edema.  Neurological: He is alert and oriented to person, place, and time. He exhibits abnormal muscle tone. Coordination normal.  Skin: Skin is warm and intact. No bruising, no ecchymosis, no petechiae and no purpura noted. Rash is not papular, not nodular, not pustular, not vesicular and not urticarial. There is erythema.     Quarter sized area of erythema to  the left lower leg with a pea size central darkening of the skin.  No edema, fluctuance , drainage, vesicles or pustules.      ED Course  Procedures (including critical care time)  Labs Reviewed - No data to display No results found.      MDM    Quarter sized area of erythema to the left lower leg.  No edema, induration or drainage.  Has a pea sized darken center.  No eschar, or pustule.    He is NV intact.  Ambulates with a steady gait.     Will treat with triamcinolone cream and bactrim.  Pt advised to return here in 2-3 days if the sx's are worsening.      Khyri Hinzman L. Trisha Mangle, PA-C 03/06/13 4098

## 2013-03-06 NOTE — ED Provider Notes (Signed)
Medical screening examination/treatment/procedure(s) were performed by non-physician practitioner and as supervising physician I was immediately available for consultation/collaboration.  Shalimar Mcclain, MD 03/06/13 1511 

## 2013-03-09 ENCOUNTER — Ambulatory Visit: Payer: Self-pay | Admitting: Family Medicine

## 2013-03-15 DIAGNOSIS — M545 Low back pain, unspecified: Secondary | ICD-10-CM | POA: Diagnosis not present

## 2013-03-25 ENCOUNTER — Ambulatory Visit (HOSPITAL_COMMUNITY)
Admission: RE | Admit: 2013-03-25 | Discharge: 2013-03-25 | Disposition: A | Discharge status: Home / Self Care | Payer: Medicare Other | Source: Ambulatory Visit | Attending: Neurosurgery | Admitting: Neurosurgery

## 2013-03-25 DIAGNOSIS — IMO0001 Reserved for inherently not codable concepts without codable children: Secondary | ICD-10-CM | POA: Insufficient documentation

## 2013-03-25 DIAGNOSIS — M545 Low back pain, unspecified: Secondary | ICD-10-CM | POA: Insufficient documentation

## 2013-03-25 NOTE — Evaluation (Signed)
Physical Therapy Evaluation  Patient Details  Name: Benjamin [name] MRN: [id number] Date of Birth: 1941/09/14  Today's Date: 03/25/2013 Time: 1410-1440 PT Time Calculation (min): 30 min Charges: 1 evaluation             Visit#: 1 of 8  Re-eval: 04/24/13 Assessment Diagnosis: Lumbago Next MD Visit: Dr. Jeral Fruit - unscheudled  Authorization: Medicare    Authorization Time Period:    Authorization Visit#: 1 of 10   Past Medical History:  Past Medical History  Diagnosis Date  . Enlarged prostate   . Vision problems     Blind x 20 years, regained site 2000, ? optic nerve injury  . Abnormal EKG     History per report of ST elevations x 20 years, no cardiac history-anomale with aorta-sts "it always looks like i am having a heart atttack on my EKG  . Headache(784.0)     last one 5 yrs ago  . Arthritis    Past Surgical History:  Past Surgical History  Procedure Laterality Date  . Appendectomy      age 35  . Right shoulder    . Left elbow    . Left thumb    . Cataract extraction w/phaco  07/27/2012    Procedure: CATARACT EXTRACTION PHACO AND INTRAOCULAR LENS PLACEMENT (IOC);  Surgeon: Gemma Payor, MD;  Location: [facility];  Service: Ophthalmology;  Laterality: Right;  CDE: 12.55    Subjective Symptoms/Limitations Pertinent History: Pt is reported to PT for lumbar radiculopathy that he reports that started about 3 months ago of insidious onset.  He reports that his leg is buring and numb from his hip to his toes.  Reports that his MD was showing him his x-rays of his spinal canal that was becoming more narrow and pinching his nerve.  How long can you sit comfortably?: 30 minutes How long can you stand comfortably?: 10 minutes How long can you walk comfortably?: No difficulty Patient Stated Goals: leg to stop hurting without medication.  Pain Assessment Currently in Pain?: Yes Pain Score: 2  (2.5) Pain Location: Leg Pain Type: Acute pain Pain Onset: More than a month ago Pain  Frequency: Constant Pain Relieving Factors: pain and nerve medication  Balance Screening Balance Screen Has the patient fallen in the past 6 months: No Has the patient had a decrease in activity level because of a fear of falling? : No Is the patient reluctant to leave their home because of a fear of falling? 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   Cognition/Observation Observation/Other Assessments Observations: increased numbness to Rt foot with prone activities.   Sensation/Coordination/Flexibility/Functional Tests Sensation Light Touch: Impaired by gross assessment Flexibility 90/90: Positive (LLE) Functional Tests Functional Tests: Oswestry Disability Index (ODI): 32%  Assessment RLE Assessment RLE Assessment: Within Functional Limits LLE Assessment LLE Assessment: Within Functional Limits Lumbar AROM Lumbar Flexion: WNL Lumbar Extension: WNL Lumbar - Right Side Bend: WNL Lumbar - Left Side Bend: WNL Palpation Palpation: significant pain and tenderness with palpation to anterior left lower extremity from ASIS to lateral mallelous.  Increased fascial restrictions to anteiror thigh.   Mobility/Balance  Posture/Postural Control Posture/Postural Control: Postural limitations Postural Limitations: ridgid, decreased lordosis     Physical Therapy Assessment and Plan PT Assessment and Plan Clinical Impression Statement: Pt is a 72 year old  male referred to PT for lumbago with impairments listed below.  After evaluation it was found that he has significant  fascial restrictions, increased symptoms with prone activities, and fascial restritions to LLE likely causing increased symptoms to his LLE.   Pt will benefit from skilled therapeutic intervention in order to improve on the following deficits: Pain;Improper body mechanics;Impaired perceived functional ability;Increased fascial restricitons  (Radiculopathy) Rehab Potential: Good PT Frequency: Min 2X/week PT Duration: 4 weeks PT Treatment/Interventions: Therapeutic activities;Therapeutic exercise;Neuromuscular re-education;Manual techniques;Modalities PT Plan: Core training (avoid prone/extension secondary to spinal stenosis): TrA, bent knee raises, supine and s/l clams, SLR, hip abduction, progress to bridges and dead bugs. flexibility (DKTC, SKTC, active hamstrings)    Goals   Home Exercise Program  Pt/caregiver will Perform Home Exercise Program: Independently  PT Goal: Perform Home Exercise Program - Progress: Goal set today  PT Short Term Goals  Time to Complete Short Term Goals: 2 weeks  PT Short Term Goal 1: Pt will improve her core strength in order to tolerate sitting with appropriate posture x1 hour for greater ease with computer work.  PT Short Term Goal 2: Pt will be educated on proper techniques to decrease symptoms of spinal stenosis.  PT Short Term Goal 3: Pt will present with decreased fascial restrictions to LLE hip/thigh region to improve fascial mobility and nerve mobility to decrease radicular symptoms.  PT Long Term Goals  Time to Complete Long Term Goals: 4 weeks  PT Long Term Goal 1: Pt will report 50% reduction in radicular symptoms to improve QOL.  PT Long Term Goal 2: Pt will score less than a 20% on the ODI for improved QOL.    Problem List Patient Active Problem List   Diagnosis Date Noted  . Decreased reflex 02/18/2013  . Paresthesia of left leg 02/18/2013  . Borderline diabetes 02/18/2013  . Mild hyperlipidemia 02/18/2013  . Pain in joint, lower leg 11/13/2012  . Hip pain 11/13/2012  . Bronchitis 10/06/2012  . BPH (benign prostatic hyperplasia) 03/09/2012  . Cataract 03/09/2012  . Tobacco user 03/09/2012    PT Plan of Care PT Home Exercise Plan: see scanned report PT Patient Instructions: importance of HEP, answered questions about spinal stenosis.  Consulted and Agree with Plan of  Care: Patient  GP Functional Assessment Tool Used: ODI: 32% Functional Limitation: Self care Self Care Current Status (Z6109): At least 20 percent but less than 40 percent impaired, limited or restricted Self Care Goal Status (U0454): At least 1 percent but less than 20 percent impaired, limited or restricted  Cade Olberding, MPT, ATC 03/25/2013, 4:43 PM  Physician Documentation Your signature is required to indicate approval of the treatment plan as stated above.  Please sign and either send electronically or make a copy of this report for your files and return this physician signed original.   Please mark one 1.__approve of plan  2. ___approve of plan with the following conditions.   ______________________________                                                          _____________________ Physician Signature  Date  

## 2013-03-30 ENCOUNTER — Ambulatory Visit (HOSPITAL_COMMUNITY)
Admission: RE | Admit: 2013-03-30 | Discharge: 2013-03-30 | Disposition: A | Discharge status: Home / Self Care | Payer: Medicare Other | Source: Ambulatory Visit | Attending: Family Medicine | Admitting: Family Medicine

## 2013-03-30 NOTE — Progress Notes (Signed)
Physical Therapy Treatment Patient Details  Name: Benjamin [name] MRN: [id number] Date of Birth: 10/05/1940  Today's Date: 03/30/2013 Time: 4098-1191 PT Time Calculation (min): 41 min Charge: TE [id number], Manual [id number]    Visit#: 2 of 8  Re-eval: 04/24/13 Assessment Diagnosis: Lumbago Next MD Visit: Dr. Jeral Fruit - unscheudled  Authorization: Medicare  Authorization Time Period:    Authorization Visit#: 2 of 10   Subjective: Symptoms/Limitations Symptoms: Pt reported back is fine, it is his Lt LE giving him trouble today.  Current pain scale 2/10 Pain Assessment Pain Score: 2  Pain Location: Leg Pain Orientation: Left  Objective:  Exercise/Treatments Stretches Active Hamstring Stretch: 3 reps;30 seconds Single Knee to Chest Stretch: 2 reps;30 seconds Double Knee to Chest Stretch: 2 reps;30 seconds Supine Ab Set: 5 reps;5 seconds;Limitations AB Set Limitations: cueing for correct mm and to breath Clam: 5 reps;Limitations Bent Knee Raise: 5 reps;5 seconds Straight Leg Raise: 10 reps Sidelying Hip Abduction: 10 reps  Manual Therapy Manual Therapy: Other (comment) Other Manual Therapy: Sciatic nerve glides in supine Lt LE  Physical Therapy Assessment and Plan PT Assessment and Plan Clinical Impression Statement: Began treatment for core strengthening with min cueing required for correct form and technique.  Pt able to complete all exercises with no c/o increased pain, pt reported acheing down Lt LE throughout session.  Noted musculature fatigue with increased reps.  Sciatic nerves glides complete to Lt LE to decrease radicular symptoms.   PT Plan: Core training (avoid prone/extension): TrA, bent knee raises, supine and s/l clams, SLR, hip abduction, progress to bridges and dead bugs. flexibility (DKTC, SKTC, active hamstrings)    Goals    Problem List Patient Active Problem List   Diagnosis Date Noted  . Decreased reflex 02/18/2013  . Paresthesia of left leg  02/18/2013  . Borderline diabetes 02/18/2013  . Mild hyperlipidemia 02/18/2013  . Pain in joint, lower leg 11/13/2012  . Hip pain 11/13/2012  . Bronchitis 10/06/2012  . BPH (benign prostatic hyperplasia) 03/09/2012  . Cataract 03/09/2012  . Tobacco user 03/09/2012    PT - End of Session Activity Tolerance: Patient tolerated treatment well General Behavior During Therapy: [facility] for tasks assessed/performed  GP    [name] 03/30/2013, 3:56 PM

## 2013-04-01 ENCOUNTER — Ambulatory Visit (HOSPITAL_COMMUNITY)
Admission: RE | Admit: 2013-04-01 | Discharge: 2013-04-01 | Disposition: A | Discharge status: Home / Self Care | Payer: Medicare Other | Source: Ambulatory Visit | Attending: Family Medicine | Admitting: Family Medicine

## 2013-04-01 NOTE — Progress Notes (Signed)
Physical Therapy Treatment Patient Details  Name: Benjamin [name] MRN: [id number] Date of Birth: [birth year]  Today's Date: 04/01/2013 Time: 0932-1021 PT Time Calculation (min): 49 min Charges: TE: [id number]  Manual: [phone],  Estim/heat: 1005-1020 Visit#: 3 of 8  Re-eval: 04/24/13 Assessment Diagnosis: Lumbago Next MD Visit: Dr. Jeral Fruit - unscheudled  Authorization: Medicare  Authorization Time Period:    Authorization Visit#: 3 of 10   Subjective: Symptoms/Limitations Symptoms: Pt reported back felt fine following last session, increased LBP pain today.  Current pain scale 3 1/2 /10 today.   Pain Assessment Currently in Pain?: Yes Pain Score: 4  Pain Location: Leg Pain Orientation: Left  Precautions/Restrictions     Exercise/Treatments Supine Ab Set: 10 reps;Limitations AB Set Limitations: 10" with pt tactile cueing himself to assure correct mm Clam: 10 reps;5 seconds Bent Knee Raise: 10 reps;5 seconds  Modalities Modalities: Moist Heat;Electrical Stimulation Manual Therapy Manual Therapy: Myofascial release Myofascial Release: to Lt quadricep to decrease fascial restrictions to quadricep region. Pharmacologist Location: Lt Field seismologist Action: IFES x15 minutes 15 volts Electrical Stimulation Goals: Pain  Physical Therapy Assessment and Plan PT Assessment and Plan Clinical Impression Statement: Pt continues to have significant pain with SKTC.  Pt has improved core coordination and is able to perform independently.  Added manual therapy and estim and MH at end of treatment to decrease pain and spasm.   PT Plan: F/u with estim and heat.  Continue with manual therapy, add s/l quad stretch in pain free limits.    Goals    Problem List Patient Active Problem List   Diagnosis Date Noted  . Decreased reflex 02/18/2013  . Paresthesia of left leg 02/18/2013  . Borderline diabetes 02/18/2013  . Mild hyperlipidemia  02/18/2013  . Pain in joint, lower leg 11/13/2012  . Hip pain 11/13/2012  . Bronchitis 10/06/2012  . BPH (benign prostatic hyperplasia) 03/09/2012  . Cataract 03/09/2012  . Tobacco user 03/09/2012    PT - End of Session Activity Tolerance: Patient tolerated treatment well General Behavior During Therapy: [facility] for tasks assessed/performed  GP    Thompson Mckim, MPT, ATC 04/01/2013, 11:32 AM

## 2013-04-06 ENCOUNTER — Ambulatory Visit (HOSPITAL_COMMUNITY)
Admission: RE | Admit: 2013-04-06 | Discharge: 2013-04-06 | Disposition: A | Discharge status: Home / Self Care | Payer: Medicare Other | Source: Ambulatory Visit | Attending: Neurosurgery | Admitting: Neurosurgery

## 2013-04-06 NOTE — Progress Notes (Signed)
Physical Therapy Treatment Patient Details  Name: Benjamin [name] MRN: [id number] Date of Birth: Apr 07, 1941  Today's Date: 04/06/2013 Time: 6440-3474 PT Time Calculation (min): 57 min Charge:  TE 30' 1518-1548, Manual 10'1550-1600,  Estim [phone]  Visit#: 4 of 8  Re-eval: 04/24/13    Authorization: Medicare  Authorization Time Period:    Authorization Visit#: 4 of 10   Subjective: Symptoms/Limitations Symptoms: Pt report pain free today.  Pt liked the estim last session.  Pt stated he had a good ride to [place] over weekend, not as good a ride back.   Pain Assessment Currently in Pain?: No/denies  Objective:   Exercise/Treatments Stretches Single Knee to Chest Stretch: 2 reps;30 seconds Quad Stretch: 3 reps;30 seconds;Limitations Quad Stretch Limitations: Bil LE Standing Other Standing Lumbar Exercises: Gastroc st 3x 30" slant board Supine Ab Set: 10 reps;Limitations AB Set Limitations: 10" with pt tactile cueing himself to assure correct mm Clam: 10 reps;5 seconds Bent Knee Raise: 10 reps;5 seconds Straight Leg Raise: 10 reps  Modalities Modalities: Electrical Stimulation Manual Therapy Manual Therapy: Myofascial release Myofascial Release: to Lt quadricep to decrease fascial restrictions to quadricep region  Physical Therapy Assessment and Plan PT Assessment and Plan Clinical Impression Statement: Added quadriceps stretches for musculature lengthening for pain relief. Continued manual techniques to reduce fascial restrictions and ended session with estim per pt request secondary to great pain relief following previous session.   PT Plan: Continue with current POC.      Goals    Problem List Patient Active Problem List   Diagnosis Date Noted  . Decreased reflex 02/18/2013  . Paresthesia of left leg 02/18/2013  . Borderline diabetes 02/18/2013  . Mild hyperlipidemia 02/18/2013  . Pain in joint, lower leg 11/13/2012  . Hip pain 11/13/2012  . Bronchitis  10/06/2012  . BPH (benign prostatic hyperplasia) 03/09/2012  . Cataract 03/09/2012  . Tobacco user 03/09/2012    PT - End of Session Activity Tolerance: Patient tolerated treatment well General Behavior During Therapy: [facility] for tasks assessed/performed  GP    [name] 04/06/2013, 6:28 PM

## 2013-04-08 ENCOUNTER — Ambulatory Visit (HOSPITAL_COMMUNITY)
Admission: RE | Admit: 2013-04-08 | Discharge: 2013-04-08 | Disposition: A | Discharge status: Home / Self Care | Payer: Medicare Other | Source: Ambulatory Visit | Attending: Family Medicine | Admitting: Family Medicine

## 2013-04-13 ENCOUNTER — Ambulatory Visit (HOSPITAL_COMMUNITY)
Admission: RE | Admit: 2013-04-13 | Discharge: 2013-04-13 | Disposition: A | Discharge status: Home / Self Care | Payer: Medicare Other | Source: Ambulatory Visit | Attending: Neurosurgery | Admitting: Neurosurgery

## 2013-04-13 NOTE — Progress Notes (Signed)
Physical Therapy Re-evaluation/Treatment/Discharge  Patient Details  Name: Benjamin [name] MRN: [id number] Date of Birth: 1941-09-09  Today's Date: 04/13/2013 Time: 4098-1191 PT Time Calculation (min): 51 min Charge: TE 4782-9562, Manual [id number]             Visit#: 6 of 8  Re-eval: 04/24/13 Assessment Diagnosis: Lumbago Next MD Visit: Dr. Jeral Fruit - unscheudled, [place]  Authorization: Medicare    Authorization Time Period:    Authorization Visit#: 6 of 10   Subjective Symptoms/Limitations Symptoms: Pt reported Lt thigh has been pain free for over week Pain Assessment Currently in Pain?: No/denies  Objective:  Sensation/Coordination/Flexibility/Functional Tests Flexibility 90/90: Negative Functional Tests Functional Tests: Oswestry Disability Index (ODI): 6% (ODI was 32%)  Assessment Palpation Palpation: no pain or tenderness with palpation to Lt thigh  Exercise/Treatments Stretches Quad Stretch: 3 reps;30 seconds;Limitations Quad Stretch Limitations: Bil LE Sidelying position Standing Scapular Retraction: Both;Theraband;15 reps Theraband Level (Scapular Retraction): Level 4 (Blue) Row: Both;15 reps;Theraband Theraband Level (Row): Level 4 (Blue) Shoulder Extension: 15 reps;Theraband Other Standing Lumbar Exercises: Gastroc st 3x 30" slant board/ rockerboard R/L 2 minutes Other Standing Lumbar Exercises: chest st 3x 30" Seated Other Seated Lumbar Exercises: posterior shoulder rolls 5x Other Seated Lumbar Exercises: cervical retractions  Manual Therapy Manual Therapy: Myofascial release Myofascial Release: to Lt quadricep to decrease fascial restriction and decrease pain  Physical Therapy Assessment and Plan PT Assessment and Plan Clinical Impression Statement: Reviewed goals following discussion with pt about great progress.  Pt is independent with HEP and able to demonstrate appropriate technique with all exercises.  Pt reported has been pain free Lt quad for over  a week.  Noted improved core strength with ability to tolerance sitting with good posture for 4 hours comfortably.  Pt aware of proper posture and able to verbalize posture landmarks as well as educated on techniques to decrease symptoms of spinal stenosis.  No palpated fascial restrictions remain to Lt hip and thight and currently pain free.  Pt reports decrease by 80% radicular symptoms.  Improved QOL with score of 6 on Oswestry low back pain scale.  Following discussion with pt and evaluated PT decision made to D/C to HEP per all goals met.   PT Plan: D/C to HEP per all goals met.      Goals Home Exercise Program Pt/caregiver will Perform Home Exercise Program: Independently PT Goal: Perform Home Exercise Program - Progress: Met (daily) PT Short Term Goals Time to Complete Short Term Goals: 2 weeks PT Short Term Goal 1: Pt will improve her core strength in order to tolerate sitting with appropriate posture x1 hour for greater ease with computer work.  PT Short Term Goal 1 - Progress: Met (can sit comfortably for 4 hours) PT Short Term Goal 2: Pt will be educated on proper techniques to decrease symptoms of spinal stenosis.  PT Short Term Goal 2 - Progress: Met PT Short Term Goal 3: Pt will present with decreased fascial restrictions to LLE hip/thigh region to improve fascial mobility and nerve mobility to decrease radicular symptoms.  PT Short Term Goal 3 - Progress: Met PT Long Term Goals Time to Complete Long Term Goals: 4 weeks PT Long Term Goal 1: Pt will report 50% reduction in radicular symptoms to improve QOL.  PT Long Term Goal 1 - Progress: Met (80%) PT Long Term Goal 2: Pt will score less than a 20% on the ODI for improved QOL.  PT Long Term Goal 2 - Progress: Met  Problem List Patient  Active Problem List   Diagnosis Date Noted  . Decreased reflex 02/18/2013  . Paresthesia of left leg 02/18/2013  . Borderline diabetes 02/18/2013  . Mild hyperlipidemia 02/18/2013  . Pain in  joint, lower leg 11/13/2012  . Hip pain 11/13/2012  . Bronchitis 10/06/2012  . BPH (benign prostatic hyperplasia) 03/09/2012  . Cataract 03/09/2012  . Tobacco user 03/09/2012    PT - End of Session Activity Tolerance: Patient tolerated treatment well General Behavior During Therapy: [facility] for tasks assessed/performed PT Plan of Care PT Patient Instructions: Landmarks for proper posture, techniques to decrease symptoms of spinal stenosis, questions answered.  GP Functional Assessment Tool Used: ODI 6% Functional Limitation: Self care Self Care Goal Status (H0865): At least 1 percent but less than 20 percent impaired, limited or restricted Self Care Discharge Status [phone]): At least 1 percent but less than 20 percent impaired, limited or restricted  [name] 04/13/2013, 6:29 PM

## 2013-04-15 ENCOUNTER — Ambulatory Visit (HOSPITAL_COMMUNITY): Payer: Self-pay

## 2013-04-21 DIAGNOSIS — H2589 Other age-related cataract: Secondary | ICD-10-CM | POA: Diagnosis not present

## 2013-04-21 DIAGNOSIS — Z961 Presence of intraocular lens: Secondary | ICD-10-CM | POA: Diagnosis not present

## 2013-04-21 DIAGNOSIS — D3130 Benign neoplasm of unspecified choroid: Secondary | ICD-10-CM | POA: Diagnosis not present

## 2013-04-21 DIAGNOSIS — H5200 Hypermetropia, unspecified eye: Secondary | ICD-10-CM | POA: Diagnosis not present

## 2013-04-29 ENCOUNTER — Other Ambulatory Visit: Payer: Self-pay | Admitting: Family Medicine

## 2013-04-29 NOTE — Telephone Encounter (Signed)
Medication refilled per protocol. 

## 2013-04-30 ENCOUNTER — Ambulatory Visit (INDEPENDENT_AMBULATORY_CARE_PROVIDER_SITE_OTHER): Payer: Medicare Other | Admitting: Family Medicine

## 2013-04-30 VITALS — BP 122/78 | HR 80 | Temp 97.4°F | Resp 20 | Wt 165.0 lb

## 2013-04-30 DIAGNOSIS — I889 Nonspecific lymphadenitis, unspecified: Secondary | ICD-10-CM

## 2013-04-30 MED ORDER — CEPHALEXIN 500 MG PO CAPS: 500.0000 mg | ORAL_CAPSULE | Freq: Three times a day (TID) | ORAL | Status: DC

## 2013-04-30 NOTE — Patient Instructions (Signed)
Treated for inflamed lymph node Take antibiotics three times as day as directed Take naprosyn twice a day  We will call on MOnday to recheck via phone

## 2013-05-02 ENCOUNTER — Encounter: Payer: Self-pay | Admitting: Family Medicine

## 2013-05-02 DIAGNOSIS — I889 Nonspecific lymphadenitis, unspecified: Secondary | ICD-10-CM | POA: Insufficient documentation

## 2013-05-02 NOTE — Assessment & Plan Note (Signed)
Appears to have a inflammed lymph node, no other URI symptoms,no rash Will treat with antibiotics and NSAIDS, if no improvement will need [place] or CT of neck to evaluate Pt is leaving for 1 month vacation next week

## 2013-05-02 NOTE — Progress Notes (Signed)
  Subjective:    Patient ID: Benjamin [name], male    DOB: 02/23/1941, 72 y.o.   MRN: [id number]  HPI  Pt here with tender knot behind right ear for past 24 hours. No injury to ear, denies drainage from ear. Denies fever, URI symptoms. No previous problems in this area, no meds taken.  Painful to lay on right side due to pressure.    Review of Systems - per above  GEN- denies fatigue, fever, weight loss,weakness, recent illness HEENT- denies eye drainage, change in vision, nasal discharge, CVS- denies chest pain, palpitations RESP- denies SOB, cough, wheeze Neuro- denies headache, dizziness, syncope, seizure activity       Objective:   Physical Exam GEN- NAD, alert and oriented x3 HEENT- PERRL, EOMI, non injected sclera, pink conjunctiva, MMM, oropharynx clear TM clear bilat no effusion, canals clear, no parotid enlargement, no dental abscess seen, no  maxillary sinus tenderness, nares clear Neck- Supple, tender 1cm mobile right posterior auricular swelling, no erythema, no fluctance, no other nodes felt CVS- RRR, no murmur RESP-CTAB EXT- No edema Pulses- Radial 2+ Skin-in tact no rash        Assessment & Plan:

## 2013-05-03 ENCOUNTER — Telehealth: Payer: Self-pay | Admitting: Family Medicine

## 2013-05-03 NOTE — Telephone Encounter (Signed)
Unable to get through cell phone Spoke with pt wife, his pain has improved and knot has gone down Taking antibiotics  Advised to complete course, no further work up needed

## 2013-06-16 ENCOUNTER — Ambulatory Visit (INDEPENDENT_AMBULATORY_CARE_PROVIDER_SITE_OTHER): Payer: Medicare Other | Admitting: Family Medicine

## 2013-06-16 ENCOUNTER — Encounter: Payer: Self-pay | Admitting: Family Medicine

## 2013-06-16 VITALS — BP 108/74 | HR 82 | Temp 97.8°F | Resp 16 | Wt 165.0 lb

## 2013-06-16 DIAGNOSIS — J0110 Acute frontal sinusitis, unspecified: Secondary | ICD-10-CM | POA: Insufficient documentation

## 2013-06-16 DIAGNOSIS — J019 Acute sinusitis, unspecified: Secondary | ICD-10-CM

## 2013-06-16 DIAGNOSIS — J0111 Acute recurrent frontal sinusitis: Secondary | ICD-10-CM | POA: Insufficient documentation

## 2013-06-16 MED ORDER — AMOXICILLIN 500 MG PO CAPS: 500.0000 mg | ORAL_CAPSULE | Freq: Three times a day (TID) | ORAL | Status: DC

## 2013-06-16 NOTE — Assessment & Plan Note (Signed)
I think this started as a viral illness but he is deathly into the bacterial phase at this time. I will place him on amoxicillin 3 times a day. He does not do well with nasal sprays therefore we will hold on Flonase. He can be congested Sudafed for the next 2-3 days. Also advised Mucinex or Robitussin

## 2013-06-16 NOTE — Patient Instructions (Signed)
Start amoxicillin for the sinusitis Use a decongestant for 2-3 days only Call if not improved Plenty of fluids Get flu shot

## 2013-06-16 NOTE — Progress Notes (Signed)
  Subjective:    Patient ID: Benjamin [name], male    DOB: 10/14/1940, 72 y.o.   MRN: [id number]  HPI Patient here with sinus pressure and drainage headache for the past 2 weeks. He denies any fever. He feels very congested and he drinks a significant amount of mucus. He's also drainage go down his throat which causes him to cough and then he coughs up yellow sputum. He's only been taking Benadryl over-the-counter. He also states that he feels fatigued with some muscle aches and he's been sick. Recently traveled to [place]   Review of Systems - per above  GEN-+ fatigue, fever, weight loss,weakness, recent illness HEENT- denies eye drainage, change in vision,+ nasal discharge, CVS- denies chest pain, palpitations RESP- denies SOB, +cough, wheeze ABD- denies N/V, change in stools, abd pain GU- denies dysuria, hematuria, dribbling, incontinence MSK- denies joint pain,+ muscle aches, injury Neuro- denies headache, dizziness, syncope, seizure activity      Objective:   Physical Exam GEN- NAD, alert and oriented x3 HEENT- PERRL, EOMI, non injected sclera, pink conjunctiva, MMM, oropharynx mild injection, TM clear bilat no effusion, + maxillary sinus tenderness, inflammed turbinates,  Nasal drainage  Neck- Supple,shotty LAD CVS- RRR, no murmur RESP-CTAB EXT- No edema Pulses- Radial 2+         Assessment & Plan:

## 2013-07-19 ENCOUNTER — Ambulatory Visit (INDEPENDENT_AMBULATORY_CARE_PROVIDER_SITE_OTHER): Payer: Medicare Other | Admitting: *Deleted

## 2013-07-19 DIAGNOSIS — Z23 Encounter for immunization: Secondary | ICD-10-CM

## 2013-08-09 ENCOUNTER — Other Ambulatory Visit: Payer: Self-pay | Admitting: Family Medicine

## 2013-09-02 ENCOUNTER — Ambulatory Visit (INDEPENDENT_AMBULATORY_CARE_PROVIDER_SITE_OTHER): Payer: Medicare Other | Admitting: Physician Assistant

## 2013-09-02 ENCOUNTER — Encounter: Payer: Self-pay | Admitting: Physician Assistant

## 2013-09-02 VITALS — BP 110/76 | HR 84 | Temp 97.4°F | Resp 20 | Wt 168.0 lb

## 2013-09-02 DIAGNOSIS — B86 Scabies: Secondary | ICD-10-CM | POA: Diagnosis not present

## 2013-09-02 MED ORDER — PERMETHRIN 5 % EX CREA: TOPICAL_CREAM | CUTANEOUS | Status: DC

## 2013-09-02 NOTE — Progress Notes (Signed)
Patient ID: Benjamin [name] MRN: [id number], DOB: Mar 17, 1941, 72 y.o. Date of Encounter: 09/02/2013, 2:31 PM    Chief Complaint:  Chief Complaint  Patient presents with  . c/o rash all over x 3 weeks     HPI: 72 y.o. year old white male reports that he has been itching all over for the past 3 weeks. Also has some areas of his skin that have bumps/lesions. As such bumps on his arms. Just developed a new bump on his hand in the web space. Says that his scrotum itches but has no actual lesions. As well, his chest itches but has no lesions. His entire body is itching including his arms chest abdomen back scrotum and legs.  He says that he has not used any new lotions detergents etc. to have caused a allergic/contact dermatitis. As well past the lesion started on his hands/forms etc. as far as possibly being something like poison ivy poison oak. He says that it did not.     Home Meds: See attached medication section for any medications that were entered at today's visit. The computer does not put those onto this list.The following list is a list of meds entered prior to today's visit.   Current Outpatient Prescriptions on File Prior to Visit  Medication Sig Dispense Refill  . acetaminophen (TYLENOL) 500 MG tablet Take 500 mg by mouth every 6 (six) hours as needed for pain.      . tamsulosin (FLOMAX) 0.4 MG CAPS capsule TAKE 1 CAPSULE BY MOUTH DAILY  90 capsule  0   No current facility-administered medications on file prior to visit.    Allergies:  Allergies  Allergen Reactions  . Arsenic     Severe swelling if patient comes in contact   . Contrast Media [Iodinated Diagnostic Agents]       Review of Systems: See HPI for pertinent ROS. All other ROS negative.    Physical Exam: Blood pressure 110/76, pulse 84, temperature 97.4 F (36.3 C), temperature source Oral, resp. rate 20, weight 168 lb (76.204 kg)., Body mass index is 23.44 kg/(m^2). General: WNWD WM. Appears in no acute  distress. Neck: Supple. No thyromegaly. No lymphadenopathy. Lungs: Clear bilaterally to auscultation without wheezes, rales, or rhonchi. Breathing is unlabored. Heart: Regular rhythm. No murmurs, rubs, or gallops. Msk:  Strength and tone normal for age. Skin: On his forearms bilaterally he has slightly raised papules that are about 0.5-1 cm diameter. The tops of these have scabs secondary to excoriation. He has similar lesions scattered on his back. The skin on his chest and abdomen appear normal with no lesions. He has a new//fresh papule in the web space of his right hand. He has a few scattered papules on his legs. Neuro: Alert and oriented X 3. Moves all extremities spontaneously. Gait is normal. CNII-XII grossly in tact. Psych:  Responds to questions appropriately with a normal affect.     ASSESSMENT AND PLAN:  72 y.o. year old male with  1. Scabies - permethrin (ELIMITE) 5 % cream; Apply cream from head (avoid mouth/nose/eyes) to soles of feet and wash after 8-14 hours.  Dispense: 60 g; Refill: 0 Patient lives with his wife. She states that she has no  lesions and no itching. Discussed the cause of this and proper treatment. Discussed that he has to make sure to clean all clothing, lens, towels etc. that he has come in contact with. He is need to be cleaned at the same time he does the  treatment.  Followup if itching and lesions do not resolve  Signed, [address] [place], [place], [facility] 09/02/2013 2:31 PM

## 2013-09-14 ENCOUNTER — Ambulatory Visit (INDEPENDENT_AMBULATORY_CARE_PROVIDER_SITE_OTHER): Payer: Medicare Other | Admitting: Family Medicine

## 2013-09-14 ENCOUNTER — Encounter: Payer: Self-pay | Admitting: Family Medicine

## 2013-09-14 VITALS — BP 100/60 | HR 80 | Temp 97.8°F | Resp 20 | Ht 71.0 in | Wt 168.0 lb

## 2013-09-14 DIAGNOSIS — R21 Rash and other nonspecific skin eruption: Secondary | ICD-10-CM | POA: Diagnosis not present

## 2013-09-14 MED ORDER — PREDNISONE 10 MG PO TABS: ORAL_TABLET | ORAL | Status: DC

## 2013-09-14 MED ORDER — LORATADINE 10 MG PO TABS: 10.0000 mg | ORAL_TABLET | Freq: Every day | ORAL | Status: DC

## 2013-09-14 MED ORDER — METHYLPREDNISOLONE ACETATE 80 MG/ML IJ SUSP
80.0000 mg | Freq: Once | INTRAMUSCULAR | Status: AC
  Administered 2013-09-14: 80 mg via INTRAMUSCULAR

## 2013-09-14 NOTE — Patient Instructions (Signed)
Call if rash does not improve- then you will be referred to dermatology Start prednisone and Claritin tomorrow F/U as needed

## 2013-09-14 NOTE — Progress Notes (Signed)
   Subjective:    Patient ID: Benjamin [name], male    DOB: 1941-07-22, 72 y.o.   MRN: [id number]  HPI  Patient here with rash for the past 6 weeks. He has severe itching all over. About 4 weeks ago he showed me the rash when he was here with with that visit with his wife at that time he has had a few. Edematous lesions on his forearm he did try Lidex ointment however this did not help. He presented to the office or the visit and was prescribed permethrin secondary to worsening rash as well as some scaly lesions in the web spaces. He completed the course of treatment and a rash has worsened per report. He is unable to sleep secondary to severe itching. He has lesions on his arms hands as well as his back legs and has itching in his scrotal region. The spots on his hands are now peeling and cracking of been causing some pain and discomfort. He does not recall any change in soap or detergent however he does have some animals ago in and out of the home. He's not had any medication changes recently.  Review of Systems - per above  GEN- denies fatigue, fever, weight loss,weakness, recent illness HEENT- denies eye drainage, change in vision, nasal discharge, CVS- denies chest pain, palpitations RESP- denies SOB, cough, wheeze MSK- denies joint pain, muscle aches, injury       Objective:   Physical Exam GEN-NAD,alert and oriented x 3 HEENT- MMM, oropharynx clear, EOMI, non icteric Skin- erythematous scaly plaques on arms, legs and back, few intermixed erythematous maculopapular lesions, cracking at web spaces of hands, peeling skin in right palm. Scotral region- erythema, mild peeling,  Excoriations on arms and legs, healing scabs on legs and arms       Assessment & Plan:

## 2013-09-14 NOTE — Assessment & Plan Note (Addendum)
This appears to be some type of dermatitis. There was no response to the permethrin cream. I've given him Depo-Medrol 80 mg he will start prednisone taper as well as Claritin antihistamine tomorrow. If this does not improve the rash he will need dermatology referral for possible biopsy

## 2013-10-10 ENCOUNTER — Other Ambulatory Visit: Payer: Self-pay | Admitting: Family Medicine

## 2013-10-10 DIAGNOSIS — R21 Rash and other nonspecific skin eruption: Secondary | ICD-10-CM

## 2013-10-21 DIAGNOSIS — B86 Scabies: Secondary | ICD-10-CM | POA: Diagnosis not present

## 2013-10-21 DIAGNOSIS — L089 Local infection of the skin and subcutaneous tissue, unspecified: Secondary | ICD-10-CM | POA: Diagnosis not present

## 2013-10-21 DIAGNOSIS — M795 Residual foreign body in soft tissue: Secondary | ICD-10-CM | POA: Diagnosis not present

## 2013-11-04 ENCOUNTER — Other Ambulatory Visit: Payer: Self-pay | Admitting: Family Medicine

## 2014-01-11 ENCOUNTER — Encounter: Payer: Self-pay | Admitting: Family Medicine

## 2014-01-11 ENCOUNTER — Ambulatory Visit (INDEPENDENT_AMBULATORY_CARE_PROVIDER_SITE_OTHER): Payer: Medicare Other | Admitting: Family Medicine

## 2014-01-11 VITALS — BP 136/78 | HR 88 | Temp 97.4°F | Resp 20 | Ht 70.5 in | Wt 165.0 lb

## 2014-01-11 DIAGNOSIS — M5137 Other intervertebral disc degeneration, lumbosacral region: Secondary | ICD-10-CM | POA: Diagnosis not present

## 2014-01-11 DIAGNOSIS — M48061 Spinal stenosis, lumbar region without neurogenic claudication: Secondary | ICD-10-CM | POA: Diagnosis not present

## 2014-01-11 DIAGNOSIS — L219 Seborrheic dermatitis, unspecified: Secondary | ICD-10-CM

## 2014-01-11 DIAGNOSIS — R21 Rash and other nonspecific skin eruption: Secondary | ICD-10-CM | POA: Diagnosis not present

## 2014-01-11 DIAGNOSIS — M5136 Other intervertebral disc degeneration, lumbar region: Secondary | ICD-10-CM

## 2014-01-11 DIAGNOSIS — L218 Other seborrheic dermatitis: Secondary | ICD-10-CM

## 2014-01-11 DIAGNOSIS — M51369 Other intervertebral disc degeneration, lumbar region without mention of lumbar back pain or lower extremity pain: Secondary | ICD-10-CM

## 2014-01-11 LAB — CBC WITH DIFFERENTIAL/PLATELET
BASOS ABS: 0.1 10*3/uL (ref 0.0–0.1)
BASOS PCT: 1 % (ref 0–1)
EOS PCT: 9 % — AB (ref 0–5)
Eosinophils Absolute: 0.6 10*3/uL (ref 0.0–0.7)
HEMATOCRIT: 43.7 % (ref 39.0–52.0)
Hemoglobin: 15 g/dL (ref 13.0–17.0)
Lymphocytes Relative: 22 % (ref 12–46)
Lymphs Abs: 1.5 10*3/uL (ref 0.7–4.0)
MCH: 32.3 pg (ref 26.0–34.0)
MCHC: 34.3 g/dL (ref 30.0–36.0)
MCV: 94 fL (ref 78.0–100.0)
MONO ABS: 0.5 10*3/uL (ref 0.1–1.0)
Monocytes Relative: 8 % (ref 3–12)
Neutro Abs: 4 10*3/uL (ref 1.7–7.7)
Neutrophils Relative %: 60 % (ref 43–77)
Platelets: 297 10*3/uL (ref 150–400)
RBC: 4.65 MIL/uL (ref 4.22–5.81)
RDW: 14.4 % (ref 11.5–15.5)
WBC: 6.7 10*3/uL (ref 4.0–10.5)

## 2014-01-11 LAB — COMPREHENSIVE METABOLIC PANEL
ALT: 74 U/L — AB (ref 0–53)
AST: 57 U/L — AB (ref 0–37)
Albumin: 4 g/dL (ref 3.5–5.2)
Alkaline Phosphatase: 42 U/L (ref 39–117)
BILIRUBIN TOTAL: 0.5 mg/dL (ref 0.2–1.2)
BUN: 21 mg/dL (ref 6–23)
CHLORIDE: 107 meq/L (ref 96–112)
CO2: 26 mEq/L (ref 19–32)
CREATININE: 0.79 mg/dL (ref 0.50–1.35)
Calcium: 8.9 mg/dL (ref 8.4–10.5)
Glucose, Bld: 88 mg/dL (ref 70–99)
Potassium: 4.5 mEq/L (ref 3.5–5.3)
Sodium: 141 mEq/L (ref 135–145)
Total Protein: 5.9 g/dL — ABNORMAL LOW (ref 6.0–8.3)

## 2014-01-11 MED ORDER — PREDNISONE 10 MG PO TABS: ORAL_TABLET | ORAL | Status: DC

## 2014-01-11 MED ORDER — METHYLPREDNISOLONE ACETATE 80 MG/ML IJ SUSP
40.0000 mg | Freq: Once | INTRAMUSCULAR | Status: AC
  Administered 2014-01-11: 40 mg via INTRAMUSCULAR

## 2014-01-11 MED ORDER — HYDROXYZINE HCL 25 MG PO TABS: 25.0000 mg | ORAL_TABLET | Freq: Three times a day (TID) | ORAL | Status: DC | PRN

## 2014-01-11 NOTE — Progress Notes (Signed)
Patient ID: Benjamin [name], male   DOB: [birth year], 73 y.o.   MRN: [id number]   Subjective:    Patient ID: Benjamin [name], male    DOB: Mar 04, 1941, 73 y.o.   MRN: [id number]  Patient presents for itching Pt here with continued rash and pruritis, was seen by dermatology after treatment in our office for scabies and for dermatitis, with both creams and prednisone taper. Dermatology diagnosed scabies given ivermectin with no change, prednisone which is the only thing that helps the pruritis and seborrheic dermatitis of scalp which he used tar shampoo for but this also did not help. He is taking claritin with minimal improvement.  He also had flare of his sciatica and DDD, he wants to return to PT to help with pain, also taking naprosyn.     Review Of Systems:  GEN- denies fatigue, fever, weight loss,weakness, recent illness HEENT- denies eye drainage, change in vision, nasal discharge, CVS- denies chest pain, palpitations RESP- denies SOB, cough, wheeze ABD- denies N/V, change in stools, abd pain GU- denies dysuria, hematuria, dribbling, incontinence MSK- +joint pain, muscle aches, injury Neuro- denies headache, dizziness, syncope, seizure activity       Objective:    BP 136/78  Pulse 88  Temp(Src) 97.4 F (36.3 C) (Oral)  Resp 20  Ht 5' 10.5" (1.791 m)  Wt 165 lb (74.844 kg)  BMI 23.33 kg/m2 GEN- NAD, alert and oriented x3 HEENT- MMM, oropharynx clear, EOMI, non icteric Skin- erythematous scaly plaques on arms, legs ,axilla, abdomen,, few intermixed erythematous maculopapular lesions, palms clear. ,  Excoriations on arms and legs,  MSK- Spine NT, equivocal SLR Left side, decreased ROM Spine  Neuro- sensation grossly in tact EXT- No edema Pulses- Radial, DP- 2+        Assessment & Plan:      Problem List Items Addressed This Visit   Rash and nonspecific skin eruption - Primary   Relevant Orders      Comprehensive metabolic panel      CBC with Differential      Varicella  zoster antibody, IgG      Varicella zoster antibody, IgM      Ambulatory referral to Dermatology    Other Visit Diagnoses   Seborrheic dermatitis of scalp        Relevant Orders       Ambulatory referral to Dermatology       Note: This dictation was prepared with Dragon dictation along with smaller phrase technology. Any transcriptional errors that result from this process are unintentional.

## 2014-01-11 NOTE — Assessment & Plan Note (Signed)
Lesions noted, refer to derm

## 2014-01-11 NOTE — Assessment & Plan Note (Signed)
Prednisone will help  Refer back to PT

## 2014-01-11 NOTE — Assessment & Plan Note (Signed)
Wife requested varicella titers to be done Check metabolic and CBC Refer to Derm again in [place], rash persist he may need biopsy Given Depo Medrol and prednisone taper Atarax given

## 2014-01-11 NOTE — Patient Instructions (Signed)
Start prednisone tomorrow Referral to PT  Atarax for itching Referral to dermatology in [place] F/u as needed

## 2014-01-12 LAB — VARICELLA ZOSTER ANTIBODY, IGM: Varicella Zoster Ab IgM: 0.07 {ISR} (ref <0.91)

## 2014-01-12 LAB — VARICELLA ZOSTER ANTIBODY, IGG: Varicella IgG: 3162 Index — ABNORMAL HIGH (ref <135.00)

## 2014-01-13 ENCOUNTER — Other Ambulatory Visit: Payer: Self-pay | Admitting: *Deleted

## 2014-01-13 DIAGNOSIS — R7989 Other specified abnormal findings of blood chemistry: Secondary | ICD-10-CM

## 2014-01-13 DIAGNOSIS — R945 Abnormal results of liver function studies: Principal | ICD-10-CM

## 2014-01-18 DIAGNOSIS — L408 Other psoriasis: Secondary | ICD-10-CM | POA: Diagnosis not present

## 2014-01-18 DIAGNOSIS — D485 Neoplasm of uncertain behavior of skin: Secondary | ICD-10-CM | POA: Diagnosis not present

## 2014-01-18 DIAGNOSIS — L259 Unspecified contact dermatitis, unspecified cause: Secondary | ICD-10-CM | POA: Diagnosis not present

## 2014-01-21 ENCOUNTER — Other Ambulatory Visit: Payer: Medicare Other

## 2014-01-21 DIAGNOSIS — R7402 Elevation of levels of lactic acid dehydrogenase (LDH): Secondary | ICD-10-CM | POA: Diagnosis not present

## 2014-01-21 DIAGNOSIS — R7401 Elevation of levels of liver transaminase levels: Secondary | ICD-10-CM | POA: Diagnosis not present

## 2014-01-21 DIAGNOSIS — R945 Abnormal results of liver function studies: Principal | ICD-10-CM

## 2014-01-21 DIAGNOSIS — R7989 Other specified abnormal findings of blood chemistry: Secondary | ICD-10-CM

## 2014-01-21 LAB — COMPLETE METABOLIC PANEL WITH GFR
ALT: 17 U/L (ref 0–53)
AST: 12 U/L (ref 0–37)
Albumin: 3.5 g/dL (ref 3.5–5.2)
Alkaline Phosphatase: 47 U/L (ref 39–117)
BILIRUBIN TOTAL: 0.6 mg/dL (ref 0.2–1.2)
BUN: 18 mg/dL (ref 6–23)
CO2: 24 meq/L (ref 19–32)
CREATININE: 0.84 mg/dL (ref 0.50–1.35)
Calcium: 8.8 mg/dL (ref 8.4–10.5)
Chloride: 105 mEq/L (ref 96–112)
GFR, EST NON AFRICAN AMERICAN: 88 mL/min
GLUCOSE: 167 mg/dL — AB (ref 70–99)
Potassium: 4.3 mEq/L (ref 3.5–5.3)
Sodium: 140 mEq/L (ref 135–145)
TOTAL PROTEIN: 5.7 g/dL — AB (ref 6.0–8.3)

## 2014-01-21 LAB — HEPATITIS PANEL, ACUTE
HCV AB: NEGATIVE
HEP B C IGM: NONREACTIVE
Hep A IgM: NONREACTIVE
Hepatitis B Surface Ag: NEGATIVE

## 2014-01-26 ENCOUNTER — Ambulatory Visit (HOSPITAL_COMMUNITY)
Admission: RE | Admit: 2014-01-26 | Discharge: 2014-01-26 | Disposition: A | Discharge status: Home / Self Care | Payer: Medicare Other | Source: Ambulatory Visit | Attending: Family Medicine | Admitting: Family Medicine

## 2014-01-26 DIAGNOSIS — IMO0001 Reserved for inherently not codable concepts without codable children: Secondary | ICD-10-CM | POA: Insufficient documentation

## 2014-01-26 DIAGNOSIS — R269 Unspecified abnormalities of gait and mobility: Secondary | ICD-10-CM | POA: Insufficient documentation

## 2014-01-26 DIAGNOSIS — F17200 Nicotine dependence, unspecified, uncomplicated: Secondary | ICD-10-CM | POA: Diagnosis not present

## 2014-01-26 DIAGNOSIS — M25559 Pain in unspecified hip: Secondary | ICD-10-CM | POA: Diagnosis not present

## 2014-01-26 DIAGNOSIS — M6281 Muscle weakness (generalized): Secondary | ICD-10-CM | POA: Diagnosis not present

## 2014-01-26 NOTE — Evaluation (Signed)
Physical Therapy Evaluation  Patient Details  Name: Benjamin [name] MRN: [id number] Date of Birth: Mar 31, 1941  Today's Date: 01/26/2014 Time: 1520-1605 PT Time Calculation (min): 45 min    1 Evaluation, Manual 1540-1555There Ex 1555-1605           Visit#: 1 of 8  Re-eval: 02/25/14 Assessment Diagnosis: Lt hip pain secodnary to limited lumbar spine mobilit, LE weakness and pain.  Next MD Visit: No futuere appointment with referring MD Prior Therapy: yes and beneficial  Authorization: [employer]    Authorization Time Period:    Authorization Visit#:   of     Past Medical History:  Past Medical History  Diagnosis Date  . Enlarged prostate   . Vision problems     Blind x 20 years, regained site 2000, ? optic nerve injury  . Abnormal EKG     History per report of ST elevations x 20 years, no cardiac history-anomale with aorta-sts "it always looks like i am having a heart atttack on my EKG  . Headache(784.0)     last one 5 yrs ago  . Arthritis    Past Surgical History:  Past Surgical History  Procedure Laterality Date  . Appendectomy      age 73  . Right shoulder    . Left elbow    . Left thumb    . Cataract extraction w/phaco  07/27/2012    Procedure: CATARACT EXTRACTION PHACO AND INTRAOCULAR LENS PLACEMENT (IOC);  Surgeon: [name], MD;  Location: [facility];  Service: Ophthalmology;  Laterality: Right;  CDE: 12.55    Subjective Symptoms/Limitations Symptoms: no pain the last two days, when pain comes on also consistent with numbness and tingling, no recent change in bathroom habits,  Pertinent History: Lt hip pain, rarely Rt. Rash. Patient currently taking prostate medication Limitations: Sitting;Walking;Standing Patient Stated Goals: stop the pain Pain Assessment Currently in Pain?: No/denies Pain Score: 5  Pain Location: Hip Pain Orientation: Left Pain Type: Chronic pain Pain Radiating Towards: Lt hip into  knee rarely to the foot.  Pain Onset: More than a month ago Pain  Frequency: Intermittent Pain Relieving Factors: Naposin, previous physical therapy Effect of Pain on Daily Activities: No difficulty sleeping  Sensation/Coordination/Flexibility/Functional Tests Flexibility 90/90: Positive (64 degrees ) Functional Tests Functional Tests: 3D hip excursion: WFL  Assessment RLE Strength Right Hip Flexion: 5/5 Right Knee Flexion: 4/5 Right Knee Extension: 5/5 Right Ankle Dorsiflexion: 5/5 Right Ankle Plantar Flexion: 5/5 LLE AROM (degrees) Left Hip External Rotation : 43 Left Hip Internal Rotation : 35 Left Ankle Dorsiflexion: 16 LLE Strength Left Hip Flexion: 5/5 Left Hip Extension: 3/5 Left Hip ABduction: 3+/5 Left Hip ADduction: 5/5 Left Knee Flexion:  (4-/5) Left Knee Extension: 5/5 Left Ankle Dorsiflexion: 4/5 Left Ankle Plantar Flexion: 4/5 Lumbar AROM Lumbar Flexion: 70% limited (bends almost exclusively  hips) Lumbar Extension: 70% limited (bends almost exclusively  hips)  Exercise/Treatments Stretches Active Hamstring Stretch: Limitations Active Hamstring Stretch Limitations: 10x 3 second hold standing Prone on Elbows Stretch: 3 reps;20 seconds Standing Other Standing Lumbar Exercises: Calf stretch with wedge 10x 3 seconds  Manual Therapy Manual Therapy: Joint mobilization Joint Mobilization: Grade 1  and 2 lumbar spine L1-L5 central posterior to anterior mobilization severely limited (patient notices improved mobility and symptoms following mobilizations for 30 seconds)  Physical Therapy Assessment and Plan PT Assessment and Plan Clinical Impression Statement: Patient displays Lt LE mobility limitations secodary to limited strength and intermittent pain along with extremely limited lubar spine mobility. Patient will  benefit from skilled PT to address the below limitations including: limited hamstring, piriformis, and gasrogc mobility, Limited lumbar spine mobility, and Lt LE weakness. This therpist was also  intrigued with the  findign tha the patient also has Lt UE weakness  with the whole left oside of the patient's body generally indicating a a whole grade weaker on the manual muscle testing scale. This leads the therapist to woder is a more systemic problem may be present though the patient displays no other signs and symptoms consistent with a neurological pathology Pt will benefit from skilled therapeutic intervention in order to improve on the following deficits: Abnormal gait;Decreased strength;Increased fascial restricitons;Decreased mobility;Impaired flexibility Rehab Potential: Good PT Frequency: Min 2X/week PT Duration: 4 weeks PT Treatment/Interventions: Functional mobility training;Therapeutic exercise;Balance training;Patient/family education;Manual techniques;Modalities PT Plan: Initial focus on improvign hamstring, calf, glut and quad flexibility and strengthening Lt LE secondary to improvign lumbar spine extension mobility (prone press-up and joint mobilizations)    Goals    Problem List Patient Active Problem List   Diagnosis Date Noted  . Seborrheic dermatitis of scalp 01/11/2014  . DDD (degenerative disc disease), lumbar 01/11/2014  . Rash and nonspecific skin eruption 09/14/2013  . Lymphadenitis 05/02/2013  . Decreased reflex 02/18/2013  . Paresthesia of left leg 02/18/2013  . Borderline diabetes 02/18/2013  . Mild hyperlipidemia 02/18/2013  . Pain in joint, lower leg 11/13/2012  . Hip pain 11/13/2012  . BPH (benign prostatic hyperplasia) 03/09/2012  . Cataract 03/09/2012  . Tobacco user 03/09/2012    PT - End of Session Activity Tolerance: Patient tolerated treatment well General Behavior During Therapy: WFL for tasks assessed/performed PT Plan of Care PT Home Exercise Plan: hamstrin and calf stretches PT Patient Instructions: 10x 3sec 2x daily  GP Functional Assessment Tool Used: FOTOStatus 98%, 2% limited Functional Limitation: Changing and maintaining body position Changing  and Maintaining Body Position Current Status (X3235): At least 1 percent but less than 20 percent impaired, limited or restricted Changing and Maintaining Body Position Goal Status (T7322): 0 percent impaired, limited or restricted  Charmian Forbis R Khayree Delellis 01/26/2014, 6:51 PM  Physician Documentation Your signature is required to indicate approval of the treatment plan as stated above.  Please sign and either send electronically or make a copy of this report for your files and return this physician signed original.   Please mark one 1.__approve of plan  2. ___approve of plan with the following conditions.   ______________________________                                                          _____________________ Physician Signature                                                                                                             Date

## 2014-01-27 DIAGNOSIS — L259 Unspecified contact dermatitis, unspecified cause: Secondary | ICD-10-CM | POA: Diagnosis not present

## 2014-02-01 ENCOUNTER — Ambulatory Visit (HOSPITAL_COMMUNITY)
Admission: RE | Admit: 2014-02-01 | Discharge: 2014-02-01 | Disposition: A | Discharge status: Home / Self Care | Payer: Medicare Other | Source: Ambulatory Visit | Attending: Family Medicine | Admitting: Family Medicine

## 2014-02-01 DIAGNOSIS — M6281 Muscle weakness (generalized): Secondary | ICD-10-CM | POA: Diagnosis not present

## 2014-02-01 DIAGNOSIS — IMO0001 Reserved for inherently not codable concepts without codable children: Secondary | ICD-10-CM | POA: Diagnosis not present

## 2014-02-01 DIAGNOSIS — M25559 Pain in unspecified hip: Secondary | ICD-10-CM | POA: Diagnosis not present

## 2014-02-01 DIAGNOSIS — R269 Unspecified abnormalities of gait and mobility: Secondary | ICD-10-CM | POA: Diagnosis not present

## 2014-02-01 DIAGNOSIS — F17200 Nicotine dependence, unspecified, uncomplicated: Secondary | ICD-10-CM | POA: Diagnosis not present

## 2014-02-01 NOTE — Progress Notes (Addendum)
Physical Therapy Treatment Patient Details  Name: Benjamin [name] MRN: [id number] Date of Birth: Jan 26, 1941  Today's Date: 02/01/2014 Time: 5284-1324 Treatment began by Ihor Austin PTA [phone] PT Time Calculation (min): 42 min  Visit#: 2 of 8  Re-eval: 02/25/14 Assessment Diagnosis: Lt hip pain secodnary to limited lumbar spine mobilit, LE weakness and pain.  Next MD Visit: No futuere appointment with referring MD Prior Therapy: yes and beneficial Authorization: medicare  Charges:  therex 40  Subjective: Symptoms/Limitations Symptoms: Pt stated he was itchiness under arm, abdominal and groin.  Reports pain reduced due to pain medication, currently pain free.  Compliant with HEP without questions. Pain Assessment Currently in Pain?: No/denies   Exercise/Treatments Stretches Active Hamstring Stretch: Limitations Active Hamstring Stretch Limitations: 10x 3 second hold standing Bil LE Press Ups: Limitations Press Ups Limitations: 10x10" Quad Stretch: 1 rep;30 seconds;Limitations Quad Stretch Limitations: prone with rope Standing Heel Raises: 10 reps;Limitations Heel Raises Limitations: single leg  Wall Slides: 10 reps;5 seconds Other Standing Lumbar Exercises: Calf stretch with wedge 10x 3 seconds Other Standing Lumbar Exercises: 3D hip excursion Prone  Straight Leg Raise: 10 reps Other Prone Lumbar Exercises: POE, pressups 10 reps Other Prone Lumbar Exercises: knee flexion with 5# weights 15 reps each     Physical Therapy Assessment and Plan PT Assessment and Plan Clinical Impression Statement: Initiated mobility exercises for lumbar spine, LE stretches and strengthening according to initial evaluation findings.  pt required therapist facilitation to perform therex slowly and controlled.   Pt without difficulty completing or complaints of pain during session.   PT Plan: Continue to focus on improving hamstring, calf, glut and quad flexibility and strengthening Lt LE.  Add  lunge and piriformis stretch next visit.     Problem List Patient Active Problem List   Diagnosis Date Noted  . Seborrheic dermatitis of scalp 01/11/2014  . DDD (degenerative disc disease), lumbar 01/11/2014  . Rash and nonspecific skin eruption 09/14/2013  . Lymphadenitis 05/02/2013  . Decreased reflex 02/18/2013  . Paresthesia of left leg 02/18/2013  . Borderline diabetes 02/18/2013  . Mild hyperlipidemia 02/18/2013  . Pain in joint, lower leg 11/13/2012  . Hip pain 11/13/2012  . BPH (benign prostatic hyperplasia) 03/09/2012  . Cataract 03/09/2012  . Tobacco user 03/09/2012   Home Exercise Program Pt/caregiver will Perform Home Exercise Program: For increased ROM PT Goal: Perform Home Exercise Program - Progress: Goal set today PT Short Term Goals Time to Complete Short Term Goals: 4 weeks PT Short Term Goal 1: Patient will display Lumbar flexion/Extension ROM limited less than 40%  PT Short Term Goal 2: Patient will demonstrate improved knee flexion strength to 4/5 so patient can better control knee deceleration durign ambualtion PT Short Term Goal 3: Patient will demsontrate improved hip extension strength to 4/5 so patient may perform sit to stand wihout UE support PT Long Term Goals Time to Complete Long Term Goals: 8 weeks PT Long Term Goal 1: Patient will display Lumbar flexion/Extension ROM limited less than 20%  PT Long Term Goal 2: Patient will demonstrate improved knee flexion strength to 5/5 so patient can better control knee deceleration durign ambualtion Long Term Goal 3: Patient will demsontrate improved hip extension strength to 5/5 so patient may perform stairs without UE support Long Term Goal 4: Patient will demsontsrtated improved hip abduction strength of 4+/5 so patient can perform single leg stand greater than 5 seconds    GP    Teena Irani, PTA/CLT 02/01/2014, 4:21 PM

## 2014-02-03 ENCOUNTER — Ambulatory Visit (HOSPITAL_COMMUNITY)
Admission: RE | Admit: 2014-02-03 | Discharge: 2014-02-03 | Disposition: A | Discharge status: Home / Self Care | Payer: Medicare Other | Source: Ambulatory Visit | Attending: Family Medicine | Admitting: Family Medicine

## 2014-02-03 DIAGNOSIS — M6281 Muscle weakness (generalized): Secondary | ICD-10-CM | POA: Diagnosis not present

## 2014-02-03 DIAGNOSIS — M25559 Pain in unspecified hip: Secondary | ICD-10-CM | POA: Diagnosis not present

## 2014-02-03 DIAGNOSIS — IMO0001 Reserved for inherently not codable concepts without codable children: Secondary | ICD-10-CM | POA: Diagnosis not present

## 2014-02-03 DIAGNOSIS — R269 Unspecified abnormalities of gait and mobility: Secondary | ICD-10-CM | POA: Diagnosis not present

## 2014-02-03 DIAGNOSIS — F17200 Nicotine dependence, unspecified, uncomplicated: Secondary | ICD-10-CM | POA: Diagnosis not present

## 2014-02-03 NOTE — Progress Notes (Signed)
Physical Therapy Treatment Patient Details  Name: Benjamin [name] MRN: [id number] Date of Birth: [birth year]  Today's Date: 02/03/2014 Time: 1515-1600 PT Time Calculation (min): 45 min Charge: TE 1515-1600  Visit#: 3 of 8  Re-eval: 02/25/14 Assessment Diagnosis: Lt hip pain secodnary to limited lumbar spine mobilit, LE weakness and pain.  Next MD Visit: No futuere appointment with referring MD Prior Therapy: yes and beneficial  Authorization: medicare  Authorization Time Period:    Authorization Visit#:   of     Subjective: Symptoms/Limitations Symptoms: Pt stated he was pain free today, compliant with HEP Pain Assessment Currently in Pain?: No/denies  Objective:  Exercise/Treatments Stretches Active Hamstring Stretch: 3 reps;30 seconds;Limitations Active Hamstring Stretch Limitations: supine with rope Press Ups: Limitations Press Ups Limitations: 10x10" Standing Heel Raises: 15 reps;Limitations Heel Raises Limitations: single leg  Functional Squats: 15 reps Forward Lunge: 10 reps;5 seconds Side Lunge: 10 reps;5 seconds Wall Slides: 10 reps;5 seconds Other Standing Lumbar Exercises: Standing lumbar extension and flexion 5x 10" holds Other Standing Lumbar Exercises: 3D hip excursion Prone  Straight Leg Raise: 15 reps Other Prone Lumbar Exercises: POE, pressups 10 reps Other Prone Lumbar Exercises: knee flexion with 5# weights 15 reps each  Stretches Active Hamstring Stretch: 3 reps;30 seconds;Limitations Active Hamstring Stretch Limitations: with rope Piriformis Stretch: 2 reps;30 seconds;Limitations Piriformis Stretch Limitations: sitting Gastroc Stretch: 3 reps;30 seconds Standing Lateral Step Up: Left;15 reps;Hand Hold: 0;Step Height: 6" Forward Step Up: Left;15 reps;Hand Hold: 0;Step Height: 6" Step Down: Left;15 reps;Hand Hold: 0;Step Height: 6";Limitations Step Down Limitations: for eccentric control trainnig Other Standing Knee Exercises: Hip abduction Bil LE  with heel against wall 10x Other Standing Knee Exercises: High march and hold 5" on airex    Physical Therapy Assessment and Plan PT Assessment and Plan Clinical Impression Statement: Progressed functional strengthening with lunges and hip flexion on airex to improve stability and strength and continued mobiilty stretches for lumbar spine.  Added piriformis stretches this session.  Pt required therapist faciliation to perform exercises slow and controlled and proper mm activation.  Pt continues to be limited with lumbar flexion and extension, continues to compensate wtih hip mobility.  No c/o pain through session, pt limited by fatigue at end of session.   PT Plan: Continue to focus on improving hamstring, calf, glut and piriformis flexibility and strengthening Lt LE.  Begin clams next session, DKTC and mad cat/cow to improve lumbar mobility.      Goals PT Short Term Goals Time to Complete Short Term Goals: 4 weeks PT Short Term Goal 1: Patient will display Lumbar flexion/Extension ROM limited less than 40%  PT Short Term Goal 1 - Progress: Progressing toward goal PT Short Term Goal 2: Patient will demonstrate improved knee flexion strength to 4/5 so patient can better control knee deceleration durign ambualtion PT Short Term Goal 2 - Progress: Progressing toward goal PT Short Term Goal 3: Patient will demsontrate improved hip extension strength to 4/5 so patient may perform sit to stand wihout UE support PT Short Term Goal 3 - Progress: Progressing toward goal PT Long Term Goals Time to Complete Long Term Goals: 8 weeks PT Long Term Goal 1: Patient will display Lumbar flexion/Extension ROM limited less than 20%  PT Long Term Goal 2: Patient will demonstrate improved knee flexion strength to 5/5 so patient can better control knee deceleration durign ambualtion Long Term Goal 3: Patient will demsontrate improved hip extension strength to 5/5 so patient may perform stairs without UE support Long  Term Goal 4: Patient will demsontsrtated improved hip abduction strength of 4+/5 so patient can perform single leg stand greater than 5 seconds  Problem List Patient Active Problem List   Diagnosis Date Noted  . Seborrheic dermatitis of scalp 01/11/2014  . DDD (degenerative disc disease), lumbar 01/11/2014  . Rash and nonspecific skin eruption 09/14/2013  . Lymphadenitis 05/02/2013  . Decreased reflex 02/18/2013  . Paresthesia of left leg 02/18/2013  . Borderline diabetes 02/18/2013  . Mild hyperlipidemia 02/18/2013  . Pain in joint, lower leg 11/13/2012  . Hip pain 11/13/2012  . BPH (benign prostatic hyperplasia) 03/09/2012  . Cataract 03/09/2012  . Tobacco user 03/09/2012    PT - End of Session Activity Tolerance: Patient tolerated treatment well General Behavior During Therapy: [facility] for tasks assessed/performed  GP    Aldona Lento 02/03/2014, 4:13 PM

## 2014-02-08 ENCOUNTER — Ambulatory Visit (HOSPITAL_COMMUNITY)
Admission: RE | Admit: 2014-02-08 | Discharge: 2014-02-08 | Disposition: A | Discharge status: Home / Self Care | Payer: Medicare Other | Source: Ambulatory Visit | Attending: Family Medicine | Admitting: Family Medicine

## 2014-02-08 DIAGNOSIS — M25559 Pain in unspecified hip: Secondary | ICD-10-CM | POA: Diagnosis not present

## 2014-02-08 DIAGNOSIS — M6281 Muscle weakness (generalized): Secondary | ICD-10-CM | POA: Diagnosis not present

## 2014-02-08 DIAGNOSIS — R269 Unspecified abnormalities of gait and mobility: Secondary | ICD-10-CM | POA: Diagnosis not present

## 2014-02-08 DIAGNOSIS — IMO0001 Reserved for inherently not codable concepts without codable children: Secondary | ICD-10-CM | POA: Diagnosis not present

## 2014-02-08 DIAGNOSIS — F17200 Nicotine dependence, unspecified, uncomplicated: Secondary | ICD-10-CM | POA: Diagnosis not present

## 2014-02-08 NOTE — Progress Notes (Signed)
Physical Therapy Treatment Patient Details  Name: Benjamin [name] MRN: [id number] Date of Birth: 1941-01-21  Today's Date: 02/08/2014 Time: 1017-5102 PT Time Calculation (min): 40 min Charge: TE 5852-7782   Visit#: 4 of 8  Re-eval: 02/25/14 Assessment Diagnosis: Lt hip pain secodnary to limited lumbar spine mobilit, LE weakness and pain.  Next MD Visit: No futuere appointment with referring MD Prior Therapy: yes and beneficial  Authorization: medicare  Authorization Time Period:    Authorization Visit#:   of     Subjective: Symptoms/Limitations Symptoms: Pain free today no c/o.  Pt stated he was sore following last session.   Pain Assessment Currently in Pain?: No/denies  Objective:   Exercise/Treatments Stretches Active Hamstring Stretch: 3 reps;30 seconds;Limitations Active Hamstring Stretch Limitations: supine with rope Double Knee to Chest Stretch: 3 reps;20 seconds Quad Stretch: 3 reps;30 seconds;Limitations Quad Stretch Limitations: prone with rope ITB Stretch: 3 reps;30 seconds Piriformis Stretch: 2 reps;30 seconds;Limitations Piriformis Stretch Limitations: manual figure 4 supine position Standing Functional Squats: 15 reps;Limitations Functional Squats Limitations: frontal and sagital planes Other Standing Lumbar Exercises: Standing lumbar extension and flexion 5x 10" holds; gastroc st 3x 30" slant board Other Standing Lumbar Exercises: 3D hip excursion 15x Sidelying Clam: 5 reps;Limitations Clam Limitations: 5x 10" with ball between feet Quadruped Madcat/Old Horse: 5 reps;Limitations Madcat/Old Horse Limitations: 10" holds     Physical Therapy Assessment and Plan PT Assessment and Plan Clinical Impression Statement: Session focus on improving lumbar mobility, added stretches and actitivities with therapist facilitation to improve mobilty.  No reports of pain through session.  Improved lunbar flexion following every rep.  Added clam exercises for gluteal  strengthening with ball between feet to improve stability to reduce compensation. PT Plan: Continue to focus on improving hamstring, calf, glut and piriformis flexibility and strengthening Lt LE.  Continue with mobility exercises including mad cat/cow, SKTC and excursions.      Goals PT Short Term Goals Time to Complete Short Term Goals: 4 weeks PT Short Term Goal 1: Patient will display Lumbar flexion/Extension ROM limited less than 40%  PT Short Term Goal 1 - Progress: Progressing toward goal PT Short Term Goal 2: Patient will demonstrate improved knee flexion strength to 4/5 so patient can better control knee deceleration durign ambualtion PT Short Term Goal 2 - Progress: Progressing toward goal PT Short Term Goal 3: Patient will demsontrate improved hip extension strength to 4/5 so patient may perform sit to stand wihout UE support PT Short Term Goal 3 - Progress: Progressing toward goal PT Long Term Goals Time to Complete Long Term Goals: 8 weeks PT Long Term Goal 1: Patient will display Lumbar flexion/Extension ROM limited less than 20%  PT Long Term Goal 2: Patient will demonstrate improved knee flexion strength to 5/5 so patient can better control knee deceleration durign ambualtion Long Term Goal 3: Patient will demsontrate improved hip extension strength to 5/5 so patient may perform stairs without UE support Long Term Goal 4: Patient will demsontsrtated improved hip abduction strength of 4+/5 so patient can perform single leg stand greater than 5 seconds  Problem List Patient Active Problem List   Diagnosis Date Noted  . Seborrheic dermatitis of scalp 01/11/2014  . DDD (degenerative disc disease), lumbar 01/11/2014  . Rash and nonspecific skin eruption 09/14/2013  . Lymphadenitis 05/02/2013  . Decreased reflex 02/18/2013  . Paresthesia of left leg 02/18/2013  . Borderline diabetes 02/18/2013  . Mild hyperlipidemia 02/18/2013  . Pain in joint, lower leg 11/13/2012  . Hip  pain  11/13/2012  . BPH (benign prostatic hyperplasia) 03/09/2012  . Cataract 03/09/2012  . Tobacco user 03/09/2012    PT - End of Session Activity Tolerance: Patient tolerated treatment well General Behavior During Therapy: [facility] for tasks assessed/performed  GP    Aldona Lento 02/08/2014, 3:19 PM

## 2014-02-09 ENCOUNTER — Other Ambulatory Visit: Payer: Self-pay | Admitting: Family Medicine

## 2014-02-10 ENCOUNTER — Ambulatory Visit (HOSPITAL_COMMUNITY): Payer: Self-pay

## 2014-02-15 ENCOUNTER — Ambulatory Visit (HOSPITAL_COMMUNITY)
Admission: RE | Admit: 2014-02-15 | Discharge: 2014-02-15 | Disposition: A | Discharge status: Home / Self Care | Payer: Medicare Other | Source: Ambulatory Visit | Attending: Family Medicine | Admitting: Family Medicine

## 2014-02-15 DIAGNOSIS — F17200 Nicotine dependence, unspecified, uncomplicated: Secondary | ICD-10-CM | POA: Insufficient documentation

## 2014-02-15 DIAGNOSIS — M25559 Pain in unspecified hip: Secondary | ICD-10-CM | POA: Insufficient documentation

## 2014-02-15 DIAGNOSIS — IMO0001 Reserved for inherently not codable concepts without codable children: Secondary | ICD-10-CM | POA: Insufficient documentation

## 2014-02-15 DIAGNOSIS — R269 Unspecified abnormalities of gait and mobility: Secondary | ICD-10-CM | POA: Insufficient documentation

## 2014-02-15 DIAGNOSIS — M6281 Muscle weakness (generalized): Secondary | ICD-10-CM | POA: Insufficient documentation

## 2014-02-15 NOTE — Progress Notes (Addendum)
Physical Therapy Treatment Patient Details  Name: Benjamin [name] MRN: [id number] Date of Birth: 1941/02/07  Today's Date: 02/15/2014 Time: 1515-1600 PT Time Calculation (min): 45 min   Charges: 4098-1191, Gait training 1545-1600 Visit#: 5 of 8  Re-eval: 02/25/14 Assessment Diagnosis: Lt hip pain secodnary to limited lumbar spine mobilit, LE weakness and pain.  Next MD Visit: No futuere appointment with referring MD Prior Therapy: yes and beneficial  Authorization: medicare   Subjective: Symptoms/Limitations Symptoms: Pain free today no c/o.  Pt stated he was sore following last session.   Pertinent History: Lt hip pain, rarely Rt. Rash. Patient currently taking prostate medication  Exercise/Treatments Stretches Active Hamstring Stretch: 3 reps;30 seconds;Limitations Active Hamstring Stretch Limitations: supine with rope Double Knee to Chest Stretch: 3 reps;20 seconds Quad Stretch: 3 reps;30 seconds;Limitations Quad Stretch Limitations: prone with rope ITB Stretch: 3 reps;30 seconds Piriformis Stretch: 2 reps;30 seconds;Limitations Piriformis Stretch Limitations: manual figure 4 supine position Standing Functional Squats: Limitations Functional Squats Limitations: Squat reach matrix with 5lb dumbbell 4x each way Other Standing Lumbar Exercises: 3D hip excursion 15x   Manual Therapy Joint Mobilization: Grade 3,4 hip superior to inferior glide, medial to lateral glides to increase hip flexion and external rotation.   Physical Therapy Assessment and Plan PT Assessment and Plan Clinical Impression Statement: With patient stating he has had no pain over the last three weeks this session focused on assessing the patient's loading mechanics to decrease the load being placed on the lumbar spine. The patient demonstrated full depth squats that while not painful, placed the knee and back under excessive strain as the patient squatted with improper form and excessive loading being placed on  his quadriceps. Following cuing to utilize more glut muscle during squatting patient was unable to perform squatting through 50% of depth.  PT Plan: Continue to focus on improving hamstring, calf, glut and piriformis flexibility and strengthening Lt LE.  Continue with mobility exercises including mad cat/cow, SKTC and excursions.  Progress glut strengthenig. via squat reach matrix    Goals PT Short Term Goals PT Short Term Goal 1: Patient will display Lumbar flexion/Extension ROM limited less than 40%  PT Short Term Goal 1 - Progress: Met PT Short Term Goal 2: Patient will demonstrate improved knee flexion strength to 4/5 so patient can better control knee deceleration durign ambualtion PT Short Term Goal 2 - Progress: Progressing toward goal PT Short Term Goal 3: Patient will demsontrate improved hip extension strength to 4/5 so patient may perform sit to stand wihout UE support PT Short Term Goal 3 - Progress: Progressing toward goal PT Long Term Goals PT Long Term Goal 1: Patient will display Lumbar flexion/Extension ROM limited less than 20%  PT Long Term Goal 1 - Progress: Met PT Long Term Goal 2: Patient will demonstrate improved knee flexion strength to 5/5 so patient can better control knee deceleration durign ambualtion PT Long Term Goal 2 - Progress: Progressing toward goal Long Term Goal 3: Patient will demsontrate improved hip extension strength to 5/5 so patient may perform stairs without UE support Long Term Goal 3 Progress: Progressing toward goal Long Term Goal 4: Patient will demsontsrtated improved hip abduction strength of 4+/5 so patient can perform single leg stand greater than 5 seconds Long Term Goal 4 Progress: Progressing toward goal  Problem List Patient Active Problem List   Diagnosis Date Noted  . Seborrheic dermatitis of scalp 01/11/2014  . DDD (degenerative disc disease), lumbar 01/11/2014  . Rash and nonspecific skin eruption  09/14/2013  . Lymphadenitis  05/02/2013  . Decreased reflex 02/18/2013  . Paresthesia of left leg 02/18/2013  . Borderline diabetes 02/18/2013  . Mild hyperlipidemia 02/18/2013  . Pain in joint, lower leg 11/13/2012  . Hip pain 11/13/2012  . BPH (benign prostatic hyperplasia) 03/09/2012  . Cataract 03/09/2012  . Tobacco user 03/09/2012    PT - End of Session Activity Tolerance: Patient tolerated treatment well General Behavior During Therapy: [facility] for tasks assessed/performed  GP    Riki Berninger R Henryk Ursin 02/15/2014, 6:13 PM

## 2014-02-17 ENCOUNTER — Ambulatory Visit (HOSPITAL_COMMUNITY)
Admission: RE | Admit: 2014-02-17 | Discharge: 2014-02-17 | Disposition: A | Discharge status: Home / Self Care | Payer: Medicare Other | Source: Ambulatory Visit | Attending: Family Medicine | Admitting: Family Medicine

## 2014-02-17 NOTE — Progress Notes (Signed)
Physical Therapy Treatment Patient Details  Name: Benjamin [name] MRN: [id number] Date of Birth: 25-Oct-1940  Today's Date: 02/17/2014 Time: 1520-1610 PT Time Calculation (min): 50 min Charge: TE 1520-1610  Visit#: 6 of 8  Re-eval: 02/25/14    Authorization: medicare  Authorization Time Period:    Authorization Visit#:   of     Subjective: Symptoms/Limitations Symptoms: Pt stated his thighs were sore today, pain scale 3/10 Pain Assessment Currently in Pain?: Yes Pain Score: 3  Pain Location: Leg Pain Orientation: Right;Left  Objective:  Exercise/Treatments Stretches Active Hamstring Stretch: 3 reps;30 seconds;Limitations Active Hamstring Stretch Limitations: supine with rope and on 14 in box ITB Stretch: 3 reps;30 seconds Piriformis Stretch: 3 reps;30 seconds;Limitations Piriformis Stretch Limitations: seated and manual figure 4 supine position Standing Functional Squats: Limitations Functional Squats Limitations: Squat reach matrix with no UE dumbbells today due to soreness; squat rotation 5x each direction Other Standing Lumbar Exercises: 3D hip excursion 15x     Physical Therapy Assessment and Plan PT Assessment and Plan Clinical Impression Statement: Pt limited by soreness followng last session, reduced intensity and exercises complete this session due to the soreness  Continued mobility exercises and squat matrix with cueing for form.  Added squat with rotation for glut med and core strengthening, pt stated he felt his knee give during new exercise.  Increased time and reps with stretches to assist with soreness.   PT Plan: Continue to focus on improving hamstring, calf, glut and piriformis flexibility and strengthening Lt LE.  Continue with mobility exercises including mad cat/cow, SKTC and excursions.  Progress glut strengthenig. via squat reach matrix    Goals PT Short Term Goals PT Short Term Goal 1: Patient will display Lumbar flexion/Extension ROM limited less than  40%  PT Short Term Goal 2: Patient will demonstrate improved knee flexion strength to 4/5 so patient can better control knee deceleration durign ambualtion PT Short Term Goal 2 - Progress: Progressing toward goal PT Short Term Goal 3: Patient will demsontrate improved hip extension strength to 4/5 so patient may perform sit to stand wihout UE support PT Short Term Goal 3 - Progress: Progressing toward goal PT Long Term Goals PT Long Term Goal 1: Patient will display Lumbar flexion/Extension ROM limited less than 20%  PT Long Term Goal 2: Patient will demonstrate improved knee flexion strength to 5/5 so patient can better control knee deceleration durign ambualtion Long Term Goal 3: Patient will demsontrate improved hip extension strength to 5/5 so patient may perform stairs without UE support Long Term Goal 4: Patient will demsontsrtated improved hip abduction strength of 4+/5 so patient can perform single leg stand greater than 5 seconds  Problem List Patient Active Problem List   Diagnosis Date Noted  . Seborrheic dermatitis of scalp 01/11/2014  . DDD (degenerative disc disease), lumbar 01/11/2014  . Rash and nonspecific skin eruption 09/14/2013  . Lymphadenitis 05/02/2013  . Decreased reflex 02/18/2013  . Paresthesia of left leg 02/18/2013  . Borderline diabetes 02/18/2013  . Mild hyperlipidemia 02/18/2013  . Pain in joint, lower leg 11/13/2012  . Hip pain 11/13/2012  . BPH (benign prostatic hyperplasia) 03/09/2012  . Cataract 03/09/2012  . Tobacco user 03/09/2012    PT - End of Session Activity Tolerance: Patient tolerated treatment well General Behavior During Therapy: [facility] for tasks assessed/performed  GP    Aldona Lento 02/17/2014, 4:17 PM

## 2014-02-22 ENCOUNTER — Ambulatory Visit (HOSPITAL_COMMUNITY)
Admission: RE | Admit: 2014-02-22 | Discharge: 2014-02-22 | Disposition: A | Discharge status: Home / Self Care | Payer: Medicare Other | Source: Ambulatory Visit | Attending: Family Medicine | Admitting: Family Medicine

## 2014-02-22 NOTE — Progress Notes (Signed)
Physical Therapy Treatment Patient Details  Name: Benjamin [name] MRN: [id number] Date of Birth: 12-21-1940  Today's Date: 02/22/2014 Time: 1525-1605 PT Time Calculation (min): 40 min Visit#: 7 of 8  Re-eval: 02/25/14 Authorization: medicare  Charges:  therex 38  Subjective: Symptoms/Limitations Symptoms: Pt showed 10 minutes late for appointment.  pt states he is sore all over, especially his legs. Pain Assessment Currently in Pain?: No/denies   Exercise/Treatments Stretches Active Hamstring Stretch: Limitations Active Hamstring Stretch Limitations: 8" step 10X3" one way Piriformis Stretch: 3 reps;30 seconds Piriformis Stretch Limitations: sitting Gastroc Stretch: 3 reps;30 seconds;Limitations Gastroc Stretch Limitations: slant board Standing Lateral Step Up: Left;15 reps;Hand Hold: 0;Step Height: 6" Forward Step Up: Left;15 reps;Hand Hold: 0;Step Height: 6" Step Down: Left;15 reps;Hand Hold: 0;Step Height: 6";Limitations Step Down Limitations: for eccentric control training Functional Squat: Limitations Functional Squat Limitations: squat with chop yellow ball 5 reps each, squat matrix without UE weights 5 reps Other Standing Knee Exercises: Hip abduction Bil LE with heel against wall 10x Other Standing Knee Exercises: 3D hip excursions     Physical Therapy Assessment and Plan PT Assessment and Plan Clinical Impression Statement: Pt continues to c/o stiffness/soreness, however able to complete all activities without difficulty.  Added squat with weighted ball rotation with work felt in glute region.  Also added airex to wall hip abduction with good contraction/stabiliization challenge.  Encouraged patient to continue stretches at home to assist with decreasing soreness.  PT Plan: Re-evaluate next visit.     Problem List Patient Active Problem List   Diagnosis Date Noted  . Seborrheic dermatitis of scalp 01/11/2014  . DDD (degenerative disc disease), lumbar 01/11/2014  .  Rash and nonspecific skin eruption 09/14/2013  . Lymphadenitis 05/02/2013  . Decreased reflex 02/18/2013  . Paresthesia of left leg 02/18/2013  . Borderline diabetes 02/18/2013  . Mild hyperlipidemia 02/18/2013  . Pain in joint, lower leg 11/13/2012  . Hip pain 11/13/2012  . BPH (benign prostatic hyperplasia) 03/09/2012  . Cataract 03/09/2012  . Tobacco user 03/09/2012    PT - End of Session Activity Tolerance: Patient tolerated treatment well General Behavior During Therapy: [facility] for tasks assessed/performed   Teena Irani, PTA/CLT 02/22/2014, 5:00 PM

## 2014-02-24 ENCOUNTER — Ambulatory Visit (HOSPITAL_COMMUNITY)
Admission: RE | Admit: 2014-02-24 | Discharge: 2014-02-24 | Disposition: A | Discharge status: Home / Self Care | Payer: Medicare Other | Source: Ambulatory Visit | Attending: Family Medicine | Admitting: Family Medicine

## 2014-02-24 NOTE — Evaluation (Signed)
Physical Therapy Discharge  Patient Details  Name: Benjamin [name] MRN: [id number] Date of Birth: 10/22/1940  Today's Date: 02/24/2014 Time: 1520-1600 PT Time Calculation (min): 40 min     TherEx: 1520-1600         Visit#: 8 of 8  Re-eval: 02/25/14 Assessment Diagnosis: Lt hip pain secodnary to limited lumbar spine mobilit, LE weakness and pain.  Next MD Visit: No futuere appointment with referring MD Prior Therapy: yes and beneficial  Authorization: medicare    Authorization Visit#:  8 of   8  Past Medical History:  Past Medical History  Diagnosis Date  . Enlarged prostate   . Vision problems     Blind x 20 years, regained site 2000, ? optic nerve injury  . Abnormal EKG     History per report of ST elevations x 20 years, no cardiac history-anomale with aorta-sts "it always looks like i am having a heart atttack on my EKG  . Headache(784.0)     last one 5 yrs ago  . Arthritis    Past Surgical History:  Past Surgical History  Procedure Laterality Date  . Appendectomy      age 4  . Right shoulder    . Left elbow    . Left thumb    . Cataract extraction w/phaco  07/27/2012    Procedure: CATARACT EXTRACTION PHACO AND INTRAOCULAR LENS PLACEMENT (IOC);  Surgeon: [name], MD;  Location: [facility];  Service: Ophthalmology;  Laterality: Right;  CDE: 12.55    Subjective Symptoms/Limitations Symptoms: Pt showed 10 minutes late for appointment.  pt states he is sore all over, especially his legs. Pain Assessment Currently in Pain?: No/denies  Sensation/Coordination/Flexibility/Functional Tests Functional Tests Functional Tests: 3D hip excursion: WFL  Assessment RLE Strength Right Knee Flexion: 5/5 LLE AROM (degrees) Left Ankle Dorsiflexion: 17 LLE Strength Left Hip Extension:  (4+/5) Left Hip ABduction:  (4+/5) Left Knee Flexion:  (4+/5 was 4-/5) Left Knee Extension: 5/5 Left Ankle Dorsiflexion: 5/5 Lumbar AROM Lumbar Flexion: WNL Lumbar Extension:  WNL  Exercise/Treatments Stretches Active Hamstring Stretch: Limitations Active Hamstring Stretch Limitations: 8" step 10X3" one way Piriformis Stretch: 3 reps;30 seconds Piriformis Stretch Limitations: sitting Gastroc Stretch: 3 reps;30 seconds;Limitations Gastroc Stretch Limitations: slant board Standing Other Standing Knee Exercises: 3D hamstring stretch with UE drivers Other Standing Knee Exercises: 3D calf stretch with hip drivers   Physical Therapy Assessment and Plan PT Assessment and Plan Clinical Impression Statement: Patient is being discharged from phsycial therapy forlowing meetign most of his goals and beign independent with his HEP with patient adamant that he can achieve his remaining goals without returning to phsyical; therapy as he feels he can achieve them by performing his current HEP. This session focused on discharge planning and education in exercises to perform so that he can contineu to progress to his long term goal of rock climbing and doing a HALO parachute jump from 32000 feet. patient is independent with HEP performance this session.  PT Plan: Patient discharged following review and demsontrating independence with HEP.     Goals Home Exercise Program Pt/caregiver will Perform Home Exercise Program: For increased ROM;For increased strengthening;Independently PT Goal: Perform Home Exercise Program - Progress: Met PT Short Term Goals PT Short Term Goal 1: Patient will display Lumbar flexion/Extension ROM limited less than 40%  PT Short Term Goal 1 - Progress: Met PT Short Term Goal 2: Patient will demonstrate improved knee flexion strength to 4/5 so patient can better control knee deceleration durign ambualtion  PT Short Term Goal 2 - Progress: Met PT Short Term Goal 3: Patient will demsontrate improved hip extension strength to 4/5 so patient may perform sit to stand wihout UE support PT Short Term Goal 3 - Progress: Met PT Long Term Goals PT Long Term Goal  1: Patient will display Lumbar flexion/Extension ROM limited less than 20%  PT Long Term Goal 1 - Progress: Met PT Long Term Goal 2: Patient will demonstrate improved knee flexion strength to 5/5 so patient can better control knee deceleration durign ambualtion PT Long Term Goal 2 - Progress: Partly met Long Term Goal 3: Patient will demsontrate improved hip extension strength to 5/5 so patient may perform stairs without UE support Long Term Goal 3 Progress: Partly met Long Term Goal 4: Patient will demsontsrtated improved hip abduction strength of 4+/5 so patient can perform single leg stand greater than 5 seconds Long Term Goal 4 Progress: Met  Problem List Patient Active Problem List   Diagnosis Date Noted  . Seborrheic dermatitis of scalp 01/11/2014  . DDD (degenerative disc disease), lumbar 01/11/2014  . Rash and nonspecific skin eruption 09/14/2013  . Lymphadenitis 05/02/2013  . Decreased reflex 02/18/2013  . Paresthesia of left leg 02/18/2013  . Borderline diabetes 02/18/2013  . Mild hyperlipidemia 02/18/2013  . Pain in joint, lower leg 11/13/2012  . Hip pain 11/13/2012  . BPH (benign prostatic hyperplasia) 03/09/2012  . Cataract 03/09/2012  . Tobacco user 03/09/2012    PT - End of Session Activity Tolerance: Patient tolerated treatment well General Behavior During Therapy: [facility] for tasks assessed/performed  GP Functional Assessment Tool Used: FOTOStatus 98%, 2% limited Functional Limitation: Changing and maintaining body position Changing and Maintaining Body Position Current Status (O1224): At least 1 percent but less than 20 percent impaired, limited or restricted Changing and Maintaining Body Position Goal Status (M2500): At least 1 percent but less than 20 percent impaired, limited or restricted Changing and Maintaining Body Position Discharge Status [phone]): At least 1 percent but less than 20 percent impaired, limited or restricted  Leia Alf 02/24/2014, 9:11  PM  Physician Documentation Your signature is required to indicate approval of the treatment plan as stated above.  Please sign and either send electronically or make a copy of this report for your files and return this physician signed original.   Please mark one 1.__approve of plan  2. ___approve of plan with the following conditions.   ______________________________                                                          _____________________ Physician Signature                                                                                                             Date

## 2014-03-01 ENCOUNTER — Other Ambulatory Visit: Payer: Self-pay | Admitting: Family Medicine

## 2014-03-01 NOTE — Telephone Encounter (Signed)
Ok to refill??  Last office visit/ refill 01/11/2014.

## 2014-03-01 NOTE — Telephone Encounter (Signed)
Okay to refill? 

## 2014-03-01 NOTE — Telephone Encounter (Signed)
Prescription sent to pharmacy.

## 2014-03-09 DIAGNOSIS — Z09 Encounter for follow-up examination after completed treatment for conditions other than malignant neoplasm: Secondary | ICD-10-CM | POA: Diagnosis not present

## 2014-03-09 DIAGNOSIS — Z79899 Other long term (current) drug therapy: Secondary | ICD-10-CM | POA: Diagnosis not present

## 2014-03-09 DIAGNOSIS — L259 Unspecified contact dermatitis, unspecified cause: Secondary | ICD-10-CM | POA: Diagnosis not present

## 2014-03-22 DIAGNOSIS — Z79899 Other long term (current) drug therapy: Secondary | ICD-10-CM | POA: Diagnosis not present

## 2014-03-22 DIAGNOSIS — L259 Unspecified contact dermatitis, unspecified cause: Secondary | ICD-10-CM | POA: Diagnosis not present

## 2014-05-03 DIAGNOSIS — Z09 Encounter for follow-up examination after completed treatment for conditions other than malignant neoplasm: Secondary | ICD-10-CM | POA: Diagnosis not present

## 2014-05-03 DIAGNOSIS — L259 Unspecified contact dermatitis, unspecified cause: Secondary | ICD-10-CM | POA: Diagnosis not present

## 2014-06-01 DIAGNOSIS — L259 Unspecified contact dermatitis, unspecified cause: Secondary | ICD-10-CM | POA: Diagnosis not present

## 2014-06-15 DIAGNOSIS — L578 Other skin changes due to chronic exposure to nonionizing radiation: Secondary | ICD-10-CM | POA: Diagnosis not present

## 2014-06-16 DIAGNOSIS — Z23 Encounter for immunization: Secondary | ICD-10-CM | POA: Diagnosis not present

## 2014-06-23 DIAGNOSIS — Z09 Encounter for follow-up examination after completed treatment for conditions other than malignant neoplasm: Secondary | ICD-10-CM | POA: Diagnosis not present

## 2014-07-06 DIAGNOSIS — L409 Psoriasis, unspecified: Secondary | ICD-10-CM | POA: Diagnosis not present

## 2014-07-07 ENCOUNTER — Ambulatory Visit (INDEPENDENT_AMBULATORY_CARE_PROVIDER_SITE_OTHER): Payer: Medicare Other | Admitting: Family Medicine

## 2014-07-07 ENCOUNTER — Encounter: Payer: Self-pay | Admitting: Family Medicine

## 2014-07-07 VITALS — BP 108/62 | HR 68 | Temp 97.5°F | Resp 18 | Ht 69.5 in | Wt 164.0 lb

## 2014-07-07 DIAGNOSIS — L309 Dermatitis, unspecified: Secondary | ICD-10-CM | POA: Diagnosis not present

## 2014-07-07 MED ORDER — TRIAMCINOLONE ACETONIDE 40 MG/ML IJ SUSP
40.0000 mg | Freq: Once | INTRAMUSCULAR | Status: AC
  Administered 2014-07-07: 40 mg via INTRAMUSCULAR

## 2014-07-07 MED ORDER — FLUOCINONIDE-E 0.05 % EX CREA: 1.0000 "application " | TOPICAL_CREAM | Freq: Two times a day (BID) | CUTANEOUS | Status: DC

## 2014-07-07 NOTE — Progress Notes (Signed)
Subjective:    Patient ID: Benjamin [name], male    DOB: [birth year], 73 y.o.   MRN: [id number]  HPI Patient is a 73 -year-old white male who normally sees Dr. Buelah Manis. He has been treated numerous times by several doctors for nonspecific rash on his skin of his legs. He presents today demanding 8 injection of kenalog.  He was previously seen by Dr. Nevada Crane in February he believed the patient had scabies. According to the patient, that doctor doesn't know anything. He is also seeing Dr. Denna Haggard who according to the patient doesn't know what he has ear he tried treating him with dapsone and also phototherapy. However the patient is unsure of the diagnosis. I have no reason office visits from Dr. 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  Patient is very animated today he. He is constantly scratching at the skin in between his legs. On examination there is an erythematous rash on both legs extending from his groin down to below his knees. The skin is erythematous cracked and scaling. There are fissures and lichenification from scratching. There is no apparent fungus. There is no drainage. There is no evidence of a secondary cellulitis.  Past Medical History  Diagnosis Date  . Enlarged prostate   . Vision problems     Blind x 20 years, regained site 2000, ? optic nerve injury  . Abnormal EKG     History per report of ST elevations x 20 years, no cardiac history-anomale with aorta-sts "it always looks like i am having a heart atttack on my EKG  . Headache(784.0)     last one 5 yrs ago  . Arthritis    Past Surgical History  Procedure Laterality Date  . Appendectomy      age 36  . Right shoulder    . Left elbow    . Left thumb    . Cataract extraction w/phaco  07/27/2012    Procedure: CATARACT EXTRACTION PHACO AND INTRAOCULAR LENS PLACEMENT (IOC);  Surgeon: [name], MD;  Location: [facility];  Service: Ophthalmology;  Laterality: Right;  CDE: 12.55   Current  Outpatient Prescriptions on File Prior to Visit  Medication Sig Dispense Refill  . acetaminophen (TYLENOL) 500 MG tablet Take 500 mg by mouth every 6 (six) hours as needed for pain.      Marland Kitchen loratadine (CLARITIN) 10 MG tablet Take 1 tablet (10 mg total) by mouth daily.  30 tablet  2  . tamsulosin (FLOMAX) 0.4 MG CAPS capsule TAKE 1 CAPSULE BY MOUTH EVERY DAY  90 capsule  1   No current facility-administered medications on file prior to visit.   Allergies  Allergen Reactions  . Arsenic     Severe swelling if patient comes in contact   . Contrast Media [Iodinated Diagnostic Agents]    History   Social History  . Marital Status: Married    Spouse Name: N/A    Number of Children: N/A  . Years of Education: N/A   Occupational History  . Not on file.   Social History Main Topics  . Smoking status: Current Every Day Smoker -- 0.50 packs/day for 50 years    Types: Cigarettes  . Smokeless tobacco: Never Used  . Alcohol Use: Yes     Comment: occassional  . Drug Use: No  . Sexual Activity: Yes   Other Topics Concern  . Not on file   Social History Narrative  . No narrative on file  Review of Systems  All other systems reviewed and are negative.      Objective:   Physical Exam  Vitals reviewed. Cardiovascular: Normal rate and regular rhythm.   Pulmonary/Chest: Effort normal and breath sounds normal.  Skin: Rash noted. There is erythema.          Assessment & Plan:  Dermatitis - Plan: fluocinonide-emollient (LIDEX-E) 0.05 % cream  Patient appears to have some type of atopic dermatitis. I will consent to the patient's request. He was given triamcinolone 40 mg intermuscularly times one. I will also start the patient on Lidex cream to be applied twice daily to the rash for 2 weeks. I've recommended a second opinion with dermatology at [facility].  Meanwhile I have encouraged the patient to followup as planned with Dr. Denna Haggard.

## 2014-08-12 ENCOUNTER — Other Ambulatory Visit: Payer: Self-pay | Admitting: Family Medicine

## 2014-08-16 ENCOUNTER — Other Ambulatory Visit: Payer: Self-pay | Admitting: *Deleted

## 2014-08-16 MED ORDER — TAMSULOSIN HCL 0.4 MG PO CAPS: 0.4000 mg | ORAL_CAPSULE | Freq: Every day | ORAL | Status: DC

## 2014-08-16 NOTE — Telephone Encounter (Signed)
Medication refilled per protocol. 

## 2014-08-16 NOTE — Telephone Encounter (Signed)
Received fax requesting refill on Flomax.   Refill appropriate and filled per protocol.

## 2014-10-19 DIAGNOSIS — H5202 Hypermetropia, left eye: Secondary | ICD-10-CM | POA: Diagnosis not present

## 2014-10-19 DIAGNOSIS — H52222 Regular astigmatism, left eye: Secondary | ICD-10-CM | POA: Diagnosis not present

## 2014-10-19 DIAGNOSIS — Z961 Presence of intraocular lens: Secondary | ICD-10-CM | POA: Diagnosis not present

## 2014-10-19 DIAGNOSIS — H4720 Unspecified optic atrophy: Secondary | ICD-10-CM | POA: Diagnosis not present

## 2014-11-08 ENCOUNTER — Other Ambulatory Visit: Payer: Self-pay | Admitting: Family Medicine

## 2014-11-08 NOTE — Telephone Encounter (Signed)
Refill appropriate and filled per protocol. 

## 2014-12-20 ENCOUNTER — Encounter: Payer: Self-pay | Admitting: Family Medicine

## 2014-12-20 LAB — HM COLONOSCOPY

## 2015-01-11 ENCOUNTER — Other Ambulatory Visit: Payer: Self-pay | Admitting: Family Medicine

## 2015-01-11 DIAGNOSIS — R05 Cough: Secondary | ICD-10-CM

## 2015-01-11 DIAGNOSIS — R059 Cough, unspecified: Secondary | ICD-10-CM

## 2015-01-11 NOTE — Progress Notes (Signed)
Pt with persistant cough for past few months He is a smoker Had URI this weekend Request CXR via Wife who was in office today  - will order

## 2015-01-13 ENCOUNTER — Other Ambulatory Visit: Payer: Self-pay | Admitting: *Deleted

## 2015-01-13 ENCOUNTER — Ambulatory Visit (HOSPITAL_COMMUNITY)
Admission: RE | Admit: 2015-01-13 | Discharge: 2015-01-13 | Disposition: A | Discharge status: Home / Self Care | Payer: Medicare Other | Source: Ambulatory Visit | Attending: Family Medicine | Admitting: Family Medicine

## 2015-01-13 DIAGNOSIS — F17200 Nicotine dependence, unspecified, uncomplicated: Secondary | ICD-10-CM | POA: Diagnosis not present

## 2015-01-13 DIAGNOSIS — J449 Chronic obstructive pulmonary disease, unspecified: Secondary | ICD-10-CM | POA: Insufficient documentation

## 2015-01-13 DIAGNOSIS — R059 Cough, unspecified: Secondary | ICD-10-CM

## 2015-01-13 DIAGNOSIS — R05 Cough: Secondary | ICD-10-CM | POA: Diagnosis not present

## 2015-02-15 ENCOUNTER — Other Ambulatory Visit: Payer: Self-pay | Admitting: Family Medicine

## 2015-04-07 ENCOUNTER — Encounter: Payer: Self-pay | Admitting: Family Medicine

## 2015-04-07 ENCOUNTER — Ambulatory Visit (INDEPENDENT_AMBULATORY_CARE_PROVIDER_SITE_OTHER): Payer: Medicare Other | Admitting: Family Medicine

## 2015-04-07 ENCOUNTER — Ambulatory Visit (HOSPITAL_COMMUNITY)
Admission: RE | Admit: 2015-04-07 | Discharge: 2015-04-07 | Disposition: A | Discharge status: Home / Self Care | Payer: Medicare Other | Source: Ambulatory Visit | Attending: Family Medicine | Admitting: Family Medicine

## 2015-04-07 VITALS — BP 118/74 | HR 86 | Temp 98.2°F | Resp 18 | Ht 70.0 in | Wt 166.0 lb

## 2015-04-07 DIAGNOSIS — Z716 Tobacco abuse counseling: Secondary | ICD-10-CM

## 2015-04-07 DIAGNOSIS — S99811A Other specified injuries of right ankle, initial encounter: Secondary | ICD-10-CM | POA: Diagnosis not present

## 2015-04-07 DIAGNOSIS — S93601A Unspecified sprain of right foot, initial encounter: Secondary | ICD-10-CM | POA: Diagnosis not present

## 2015-04-07 DIAGNOSIS — S93401A Sprain of unspecified ligament of right ankle, initial encounter: Secondary | ICD-10-CM

## 2015-04-07 DIAGNOSIS — M79671 Pain in right foot: Secondary | ICD-10-CM | POA: Insufficient documentation

## 2015-04-07 DIAGNOSIS — W1789XA Other fall from one level to another, initial encounter: Secondary | ICD-10-CM | POA: Insufficient documentation

## 2015-04-07 DIAGNOSIS — S46001A Unspecified injury of muscle(s) and tendon(s) of the rotator cuff of right shoulder, initial encounter: Secondary | ICD-10-CM

## 2015-04-07 DIAGNOSIS — R000 Tachycardia, unspecified: Secondary | ICD-10-CM

## 2015-04-07 MED ORDER — NAPROXEN 500 MG PO TABS: 500.0000 mg | ORAL_TABLET | Freq: Two times a day (BID) | ORAL | Status: DC

## 2015-04-07 MED ORDER — BUPROPION HCL ER (SR) 150 MG PO TB12: 150.0000 mg | ORAL_TABLET | Freq: Two times a day (BID) | ORAL | Status: DC

## 2015-04-07 NOTE — Progress Notes (Signed)
Patient ID: Benjamin [name], male   DOB: [birth year], 74 y.o.   MRN: [id number]   Subjective:    Patient ID: Benjamin [name], male    DOB: 1941-04-17, 74 y.o.   MRN: [id number]  Patient presents for R Shoulder Pain; Tachycardia; and S/P fall  patient here with multiple concerns. He's had recurrence of his right shoulder pain which she actually had surgery performed on greater than 10 years ago for rotator cuff injury which she sustained over the years. For the past 3 months he's had increased pain and some decreased range of motion in the right shoulder. He takes Naprosyn on and off.  The past week he checked his heart rate and noticed that it was close to 100 and he was at rest he did not have any palpitations to 90 chest pain no shortness of breath was concerned about this. He does drink a significant amount of caffeine he is also a smoker and concern for COPD which pulmonary function test needs to be rescheduled.  He is also having multiple falls in the past couple weeks he tripped while walking his dog he also tripped off of the stair coming out of an office building. He believes that he sprained his right ankle however this is been about 3 weeks and is still in significant pain he also gets swelling of the ankle. He also stopped his right great toe one of the times and he thinks there may be a fracture  He is interested in quitting smoking   Review Of Systems: per above  GEN- denies fatigue, fever, weight loss,weakness, recent illness HEENT- denies eye drainage, change in vision, nasal discharge, CVS- denies chest pain, palpitations RESP- denies SOB, cough, wheeze ABD- denies N/V, change in stools, abd pain GU- denies dysuria, hematuria, dribbling, incontinence MSK- + joint pain, muscle aches, injury Neuro- denies headache, dizziness, syncope, seizure activity       Objective:    BP 118/74 mmHg  Pulse 86  Temp(Src) 98.2 F (36.8 C) (Oral)  Resp 18  Ht 5\' 10"  (1.778 m)  Wt 166 lb (75.297 kg)   BMI 23.82 kg/m2 GEN- NAD, alert and oriented x3 HEENT- PERRL, EOMI, non injected sclera, pink conjunctiva, MMM, oropharynx clear Neck- Supple, no thyromegaly CVS- RRR, no murmur RESP-CTAB Neuro- CNII-XII intact MSK- TTP over PIP right great toe, mild swelling no bruising noted, able to bear weight, mild swelling of lateral malleous, +squeeze mid foot, decreased ROM ankle Decreased ROM right shoulder, pain/catch above 90 degrees ROM, +empty can  EXT- No edema Pulses- Radial, DP- 2+   EKG- NSR      Assessment & Plan:      Problem List Items Addressed This Visit    None    Visit Diagnoses    Tachycardia    -  Primary    Normall EKG, no abnormal HR during visits, no actual symptoms, doubt falls related based on descriptions, check labs, consider 24 holter monitor    Relevant Orders    EKG 12-Lead (Completed)    CBC with Differential/Platelet (Completed)    Comprehensive metabolic panel (Completed)    TSH (Completed)    Sprain of ankle, right, initial encounter        xray of ankle, RICE, NSAIDS, norco for pain    Relevant Orders    DG Foot Complete Right (Completed)    Encounter for tobacco use cessation counseling        Trial of wellbutrin discussed SE    Rotator cuff  injury, right, initial encounter        with previous surgery on same side, refer to ortho for evaluation       Note: This dictation was prepared with Dragon dictation along with smaller phrase technology. Any transcriptional errors that result from this process are unintentional.

## 2015-04-07 NOTE — Patient Instructions (Addendum)
EKG is normal, we will call with lab results Referral to orthopedics for shoulder Get the xray- of right foot  Wellbutrin for smoking - start with 1 in morning twice a day  PFT test scheduled will be scheduled  F/U pending results

## 2015-04-08 LAB — COMPREHENSIVE METABOLIC PANEL
ALK PHOS: 47 U/L (ref 39–117)
ALT: 10 U/L (ref 0–53)
AST: 17 U/L (ref 0–37)
Albumin: 4.2 g/dL (ref 3.5–5.2)
BILIRUBIN TOTAL: 1 mg/dL (ref 0.2–1.2)
BUN: 14 mg/dL (ref 6–23)
CO2: 24 mEq/L (ref 19–32)
Calcium: 9.2 mg/dL (ref 8.4–10.5)
Chloride: 106 mEq/L (ref 96–112)
Creat: 0.9 mg/dL (ref 0.50–1.35)
GLUCOSE: 90 mg/dL (ref 70–99)
Potassium: 4.2 mEq/L (ref 3.5–5.3)
SODIUM: 143 meq/L (ref 135–145)
Total Protein: 6.4 g/dL (ref 6.0–8.3)

## 2015-04-08 LAB — CBC WITH DIFFERENTIAL/PLATELET
BASOS ABS: 0.1 10*3/uL (ref 0.0–0.1)
BASOS PCT: 1 % (ref 0–1)
Eosinophils Absolute: 0.2 10*3/uL (ref 0.0–0.7)
Eosinophils Relative: 3 % (ref 0–5)
HCT: 45.5 % (ref 39.0–52.0)
HEMOGLOBIN: 15.6 g/dL (ref 13.0–17.0)
Lymphocytes Relative: 20 % (ref 12–46)
Lymphs Abs: 1.6 10*3/uL (ref 0.7–4.0)
MCH: 31.9 pg (ref 26.0–34.0)
MCHC: 34.3 g/dL (ref 30.0–36.0)
MCV: 93 fL (ref 78.0–100.0)
MONO ABS: 0.6 10*3/uL (ref 0.1–1.0)
MPV: 9 fL (ref 8.6–12.4)
Monocytes Relative: 8 % (ref 3–12)
NEUTROS PCT: 68 % (ref 43–77)
Neutro Abs: 5.5 10*3/uL (ref 1.7–7.7)
Platelets: 279 10*3/uL (ref 150–400)
RBC: 4.89 MIL/uL (ref 4.22–5.81)
RDW: 14.6 % (ref 11.5–15.5)
WBC: 8.1 10*3/uL (ref 4.0–10.5)

## 2015-04-08 LAB — TSH: TSH: 1.479 u[IU]/mL (ref 0.350–4.500)

## 2015-04-14 DIAGNOSIS — M25511 Pain in right shoulder: Secondary | ICD-10-CM | POA: Diagnosis not present

## 2015-07-17 ENCOUNTER — Ambulatory Visit (INDEPENDENT_AMBULATORY_CARE_PROVIDER_SITE_OTHER): Payer: Medicare Other | Admitting: *Deleted

## 2015-07-17 DIAGNOSIS — Z23 Encounter for immunization: Secondary | ICD-10-CM

## 2015-07-17 MED ORDER — FLUOCINONIDE-E 0.05 % EX CREA: 1.0000 "application " | TOPICAL_CREAM | Freq: Two times a day (BID) | CUTANEOUS | Status: DC

## 2015-07-17 NOTE — Progress Notes (Signed)
Patient ID: Benjamin [name], male   DOB: [birth year], 74 y.o.   MRN: [id number]  Patient seen in office for Influenza Vaccination.   Tolerated IM administration well.   Immunization history updated.

## 2015-07-18 ENCOUNTER — Telehealth: Payer: Self-pay | Admitting: *Deleted

## 2015-07-18 NOTE — Telephone Encounter (Signed)
Pt has appt scheduled at [facility] for Pulmonary function test on Wed Nov 2 at 930am, pt is to go to admissions of the hospital and check in with them and will take him to his destination,pt is aware of appt

## 2015-07-19 ENCOUNTER — Ambulatory Visit (HOSPITAL_COMMUNITY)
Admission: RE | Admit: 2015-07-19 | Discharge: 2015-07-19 | Disposition: A | Discharge status: Home / Self Care | Payer: Medicare Other | Source: Ambulatory Visit | Attending: Family Medicine | Admitting: Family Medicine

## 2015-07-19 DIAGNOSIS — J449 Chronic obstructive pulmonary disease, unspecified: Secondary | ICD-10-CM | POA: Diagnosis not present

## 2015-07-19 LAB — PULMONARY FUNCTION TEST
DL/VA % pred: 40 %
DL/VA: 1.88 ml/min/mmHg/L
DLCO UNC: 11.25 ml/min/mmHg
DLCO unc % pred: 33 %
FEF 25-75 POST: 1.48 L/s
FEF 25-75 PRE: 1.19 L/s
FEF2575-%Change-Post: 24 %
FEF2575-%Pred-Post: 61 %
FEF2575-%Pred-Pre: 49 %
FEV1-%CHANGE-POST: 8 %
FEV1-%PRED-PRE: 75 %
FEV1-%Pred-Post: 81 %
FEV1-POST: 2.66 L
FEV1-Pre: 2.45 L
FEV1FVC-%Change-Post: 11 %
FEV1FVC-%PRED-PRE: 82 %
FEV6-%CHANGE-POST: 0 %
FEV6-%PRED-POST: 93 %
FEV6-%Pred-Pre: 93 %
FEV6-POST: 3.92 L
FEV6-Pre: 3.94 L
FEV6FVC-%CHANGE-POST: 1 %
FEV6FVC-%PRED-POST: 105 %
FEV6FVC-%PRED-PRE: 103 %
FVC-%Change-Post: -2 %
FVC-%Pred-Post: 88 %
FVC-%Pred-Pre: 90 %
FVC-Post: 3.97 L
FVC-Pre: 4.05 L
PRE FEV6/FVC RATIO: 97 %
Post FEV1/FVC ratio: 67 %
Post FEV6/FVC ratio: 99 %
Pre FEV1/FVC ratio: 60 %
RV % PRED: 90 %
RV: 2.33 L
TLC % PRED: 81 %
TLC: 5.91 L

## 2015-07-19 MED ORDER — ALBUTEROL SULFATE (2.5 MG/3ML) 0.083% IN NEBU
2.5000 mg | INHALATION_SOLUTION | Freq: Once | RESPIRATORY_TRACT | Status: AC
  Administered 2015-07-19: 2.5 mg via RESPIRATORY_TRACT

## 2015-07-20 ENCOUNTER — Other Ambulatory Visit: Payer: Self-pay | Admitting: *Deleted

## 2015-07-20 MED ORDER — TIOTROPIUM BROMIDE MONOHYDRATE 2.5 MCG/ACT IN AERS: INHALATION_SPRAY | RESPIRATORY_TRACT | Status: DC

## 2015-08-11 ENCOUNTER — Other Ambulatory Visit: Payer: Self-pay | Admitting: Family Medicine

## 2015-08-14 NOTE — Telephone Encounter (Signed)
Refill appropriate and filled per protocol. 

## 2015-08-27 ENCOUNTER — Encounter (HOSPITAL_COMMUNITY): Payer: Self-pay | Admitting: Emergency Medicine

## 2015-08-27 ENCOUNTER — Emergency Department (HOSPITAL_COMMUNITY)
Admission: EM | Admit: 2015-08-27 | Discharge: 2015-08-27 | Disposition: A | Discharge status: Home / Self Care | Payer: Medicare Other | Attending: Emergency Medicine | Admitting: Emergency Medicine

## 2015-08-27 ENCOUNTER — Emergency Department (HOSPITAL_COMMUNITY): Payer: Medicare Other

## 2015-08-27 DIAGNOSIS — M1990 Unspecified osteoarthritis, unspecified site: Secondary | ICD-10-CM | POA: Insufficient documentation

## 2015-08-27 DIAGNOSIS — Z87891 Personal history of nicotine dependence: Secondary | ICD-10-CM | POA: Insufficient documentation

## 2015-08-27 DIAGNOSIS — R21 Rash and other nonspecific skin eruption: Secondary | ICD-10-CM | POA: Insufficient documentation

## 2015-08-27 DIAGNOSIS — Z87430 Personal history of prostatic dysplasia: Secondary | ICD-10-CM | POA: Diagnosis not present

## 2015-08-27 DIAGNOSIS — M25562 Pain in left knee: Secondary | ICD-10-CM | POA: Diagnosis not present

## 2015-08-27 DIAGNOSIS — H547 Unspecified visual loss: Secondary | ICD-10-CM | POA: Diagnosis not present

## 2015-08-27 DIAGNOSIS — Z79899 Other long term (current) drug therapy: Secondary | ICD-10-CM | POA: Insufficient documentation

## 2015-08-27 MED ORDER — HYDROCODONE-ACETAMINOPHEN 5-325 MG PO TABS
1.0000 | ORAL_TABLET | Freq: Once | ORAL | Status: AC
  Administered 2015-08-27: 1 via ORAL
  Filled 2015-08-27: qty 1

## 2015-08-27 MED ORDER — HYDROCODONE-ACETAMINOPHEN 5-325 MG PO TABS: 1.0000 | ORAL_TABLET | Freq: Four times a day (QID) | ORAL | Status: DC | PRN

## 2015-08-27 NOTE — ED Notes (Signed)
Notified Dr. Rogene Houston about pt's bp.  Pt denies any dizziness or light headedness.  Instructed pt to only take 1/2 to 1 pain pill instead of 1-2.  Also instructed pt to rest today, not to do anything dangerous such as climbing ladders, driving, etc.   Pt verbalized understanding.

## 2015-08-27 NOTE — Discharge Instructions (Signed)
Call your orthopedic group for follow-up. Where the knee immobilizer at all times. If it is uncomfortable for you it is not necessary. Take pain medicine as needed. Return for any new or worse symptoms. X-rays of the knee without any acute findings.

## 2015-08-27 NOTE — ED Notes (Signed)
MD at bedside. 

## 2015-08-27 NOTE — ED Notes (Signed)
Patient c/o severe left leg pain and weakness with no known injury. Per patient started last night. Patient states "It feels like two tendons or crossed together." Patient reports pain with movement and touch. Patient took 2 extra strength tylenols, ibuprofen, and naprosyn with no relief.

## 2015-08-27 NOTE — ED Provider Notes (Signed)
CSN: [id number]     Arrival date & time 08/27/15  1046 History  By signing my name below, I, [facility], attest that this documentation has been prepared under the direction and in the presence of Fredia Sorrow, MD. Electronically Signed: Virgel Bouquet, ED Scribe. 08/27/2015. 12:06 PM.   Chief Complaint  Patient presents with  . Leg Pain   The history is provided by the spouse. No language interpreter was used.   HPI Comments: Benjamin [name] is a 74 y.o. male who presents to the Emergency Department with hx of arthritis complaining of constant, "severe" lateral left knee pain onset 11 hours ago that worsened 6 hours ago. He describes the pain as "twisting of tendons". Patient reports no recent injuries. He notes that he was sitting in a chair when he first noticed the pain and was able to ambulate to bed but that pain worsened upon waking when the patient attempted to stand. Per wife, pt was unable to bear weight secondary to pain or ambulate normally secondary to pain. She states that he also had a mild rash in the groin after taking pain medication that resolved PTA. He states pain is worsened with movement but is unchanged by touch. He has taken 2x extra strength Tylenol, ibuprofen, naproxen, and Benadryl and tried stretching with no relief of pain but relief of the rash. Patient denies edema in the left knee.  Past Medical History  Diagnosis Date  . Enlarged prostate   . Vision problems     Blind x 20 years, regained site 2000, ? optic nerve injury  . Abnormal EKG     History per report of ST elevations x 20 years, no cardiac history-anomale with aorta-sts "it always looks like i am having a heart atttack on my EKG  . Headache(784.0)     last one 5 yrs ago  . Arthritis    Past Surgical History  Procedure Laterality Date  . Appendectomy      age 89  . Right shoulder    . Left elbow    . Left thumb    . Cataract extraction w/phaco  07/27/2012    Procedure: CATARACT  EXTRACTION PHACO AND INTRAOCULAR LENS PLACEMENT (IOC);  Surgeon: [name], MD;  Location: [facility];  Service: Ophthalmology;  Laterality: Right;  CDE: 12.55   Family History  Problem Relation Age of Onset  . Diabetes Mother   . COPD Mother   . Heart disease Mother    Social History  Substance Use Topics  . Smoking status: Former Smoker -- 0.50 packs/day for 50 years    Types: Cigarettes    Quit date: 02/15/2015  . Smokeless tobacco: Never Used  . Alcohol Use: Yes     Comment: occassional    Review of Systems  Constitutional: Negative for fever and chills.  HENT: Negative for rhinorrhea and sore throat.   Eyes: Negative for photophobia and visual disturbance.  Respiratory: Negative for cough and shortness of breath.   Cardiovascular: Negative for chest pain and leg swelling.  Gastrointestinal: Negative for nausea, vomiting, abdominal pain and diarrhea.  Genitourinary: Negative for dysuria.  Musculoskeletal: Negative for back pain.  Skin: Positive for rash.  Neurological: Negative for headaches.  Hematological: Does not bruise/bleed easily.  Psychiatric/Behavioral: Negative for confusion.      Allergies  Arsenic and Contrast media  Home Medications   Prior to Admission medications   Medication Sig Start Date End Date Taking? Authorizing Provider  acetaminophen (TYLENOL) 500 MG tablet Take  500 mg by mouth every 6 (six) hours as needed for pain.   Yes Historical Provider, MD  tamsulosin (FLOMAX) 0.4 MG CAPS capsule TAKE 1 CAPSULE BY MOUTH EVERY DAY 08/14/15  Yes Alycia Rossetti, MD  buPROPion [facility] SR) 150 MG 12 hr tablet Take 1 tablet (150 mg total) by mouth 2 (two) times daily. Patient not taking: Reported on 08/27/2015 04/07/15   Alycia Rossetti, MD  fluocinonide-emollient (LIDEX-E) 0.05 % cream Apply 1 application topically 2 (two) times daily. Patient not taking: Reported on 08/27/2015 07/17/15   Alycia Rossetti, MD  HYDROcodone-acetaminophen (NORCO/VICODIN)  5-325 MG tablet Take 1-2 tablets by mouth every 6 (six) hours as needed. 08/27/15   Fredia Sorrow, MD  naproxen (NAPROSYN) 500 MG tablet Take 1 tablet (500 mg total) by mouth 2 (two) times daily with a meal. Patient not taking: Reported on 08/27/2015 04/07/15   Alycia Rossetti, MD   BP 104/86 mmHg  Pulse 101  Temp(Src) 97.7 F (36.5 C) (Oral)  Resp 18  Ht 5\' 11"  (1.803 m)  Wt 74.844 kg  BMI 23.02 kg/m2  SpO2 96% Physical Exam  Constitutional: He is oriented to person, place, and time. He appears well-developed and well-nourished. No distress.  HENT:  Head: Normocephalic and atraumatic.  Eyes: Conjunctivae and EOM are normal. Pupils are equal, round, and reactive to light. No scleral icterus.  Neck: Neck supple. No tracheal deviation present.  Cardiovascular: Normal rate, regular rhythm and normal heart sounds.   No murmur heard. Pulmonary/Chest: Effort normal and breath sounds normal. No respiratory distress. He has no wheezes.  Abdominal: Soft. Bowel sounds are normal. There is no tenderness.  Musculoskeletal: Normal range of motion.  Dorsalis pedis pulse on right and left1+. Tenderness laterally. No effusion, no redness, no increased warmth. Calf was non-tender.  Neurological: He is alert and oriented to person, place, and time. He has normal reflexes. No cranial nerve deficit. He exhibits normal muscle tone. Coordination normal.  Skin: Skin is warm and dry.  Psychiatric: He has a normal mood and affect. His behavior is normal.  Nursing note and vitals reviewed.   ED Course  Procedures   DIAGNOSTIC STUDIES: Oxygen Saturation is 96% on RA, adequate by my interpretation.    COORDINATION OF CARE: 11:20 AM Will order left knee x-rays. Discussed treatment plan with pt at bedside and pt agreed to plan.  Labs Review Labs Reviewed - No data to display  Imaging Review Dg Knee Complete 4 Views Left  08/27/2015  CLINICAL DATA:  Pain for 1 day.  No known injury. EXAM: LEFT  KNEE - COMPLETE 4+ VIEW COMPARISON:  None. FINDINGS: Frontal, lateral, and bilateral oblique views were obtained. There is no demonstrable fracture or dislocation. No joint effusion. Joint spaces appear intact. No erosive change. IMPRESSION: No fracture or joint effusion.  No appreciable arthropathy. Electronically Signed   By: Lowella Grip III M.D.   On: 08/27/2015 13:42   I have personally reviewed and evaluated these images and lab results as part of my medical decision-making.   EKG Interpretation None      MDM   Final diagnoses:  Knee pain, acute, left    Patient with acute onset of knee pain last nightat the injury. Does have some joint line tenderness laterally most of the pain is to the anterior lateral part of the left knee. X-rays of that area are negative. No significant swelling redness or increased warmth or effusion. Will treat with knee immobilizer pain medicine  and follow-up with orthopedics. Patient followed by Percell Miller and wainer in the past.  I personally performed the services described in this documentation, which was scribed in my presence. The recorded information has been reviewed and is accurate.      Fredia Sorrow, MD 08/27/15 1355

## 2015-08-29 DIAGNOSIS — M25552 Pain in left hip: Secondary | ICD-10-CM | POA: Diagnosis not present

## 2015-08-29 DIAGNOSIS — M2392 Unspecified internal derangement of left knee: Secondary | ICD-10-CM | POA: Diagnosis not present

## 2015-08-31 DIAGNOSIS — M948X6 Other specified disorders of cartilage, lower leg: Secondary | ICD-10-CM | POA: Diagnosis not present

## 2015-08-31 DIAGNOSIS — S83232A Complex tear of medial meniscus, current injury, left knee, initial encounter: Secondary | ICD-10-CM | POA: Diagnosis not present

## 2015-08-31 DIAGNOSIS — S83222A Peripheral tear of medial meniscus, current injury, left knee, initial encounter: Secondary | ICD-10-CM | POA: Diagnosis not present

## 2015-09-04 DIAGNOSIS — S83242A Other tear of medial meniscus, current injury, left knee, initial encounter: Secondary | ICD-10-CM | POA: Diagnosis not present

## 2015-09-20 DIAGNOSIS — Y929 Unspecified place or not applicable: Secondary | ICD-10-CM | POA: Diagnosis not present

## 2015-09-20 DIAGNOSIS — Y939 Activity, unspecified: Secondary | ICD-10-CM | POA: Diagnosis not present

## 2015-09-20 DIAGNOSIS — S83232A Complex tear of medial meniscus, current injury, left knee, initial encounter: Secondary | ICD-10-CM | POA: Diagnosis not present

## 2015-09-20 DIAGNOSIS — M179 Osteoarthritis of knee, unspecified: Secondary | ICD-10-CM | POA: Diagnosis not present

## 2015-09-20 DIAGNOSIS — S83242A Other tear of medial meniscus, current injury, left knee, initial encounter: Secondary | ICD-10-CM | POA: Diagnosis not present

## 2015-09-20 DIAGNOSIS — Y999 Unspecified external cause status: Secondary | ICD-10-CM | POA: Diagnosis not present

## 2015-09-20 DIAGNOSIS — G8918 Other acute postprocedural pain: Secondary | ICD-10-CM | POA: Diagnosis not present

## 2015-09-22 ENCOUNTER — Encounter: Payer: Self-pay | Admitting: Family Medicine

## 2015-09-22 ENCOUNTER — Ambulatory Visit (INDEPENDENT_AMBULATORY_CARE_PROVIDER_SITE_OTHER): Payer: Medicare Other | Admitting: Family Medicine

## 2015-09-22 VITALS — BP 122/64 | HR 80 | Temp 98.2°F | Resp 18 | Ht 70.0 in | Wt 174.0 lb

## 2015-09-22 DIAGNOSIS — J410 Simple chronic bronchitis: Secondary | ICD-10-CM | POA: Diagnosis not present

## 2015-09-22 MED ORDER — PREDNISONE 20 MG PO TABS: 40.0000 mg | ORAL_TABLET | Freq: Every day | ORAL | Status: DC

## 2015-09-22 MED ORDER — DOXYCYCLINE HYCLATE 100 MG PO TABS: 100.0000 mg | ORAL_TABLET | Freq: Two times a day (BID) | ORAL | Status: DC

## 2015-09-22 NOTE — Patient Instructions (Addendum)
Take antibiotics as prescribed  Take prednisone Use albuterol inhaler at home  F/U as needed

## 2015-09-22 NOTE — Progress Notes (Signed)
Patient ID: Benjamin [name], male   DOB: 12-15-1940, 75 y.o.   MRN: [id number]   Subjective:    Patient ID: Benjamin [name], male    DOB: 1941-03-04, 75 y.o.   MRN: [id number]  Patient presents for Illness  patient here with worsening cough with mild production wheezing he has mild COPD but declines using inhalers on a regular basis because he does want any chemicals in his body. He actually had knee surgery 2 days ago but he has not had any fever or chills. He is not on any current antibiotics. He is not using any home remedies. He is taking hydrocodone and Tylenol for pain. His wife is currently sick as well. His wheezing and chest tightness gets worse in the afternoons. He feels okay right now.    Review Of Systems:  GEN- denies fatigue, fever, weight loss,weakness, recent illness HEENT- denies eye drainage, change in vision, +nasal discharge, CVS- denies chest pain, palpitations RESP- denies SOB, +cough, +wheeze ABD- denies N/V, change in stools, abd pain GU- denies dysuria, hematuria, dribbling, incontinence MSK- + joint pain, muscle aches, injury Neuro- denies headache, dizziness, syncope, seizure activity       Objective:    BP 122/64 mmHg  Pulse 80  Temp(Src) 98.2 F (36.8 C) (Oral)  Resp 18  Ht 5\' 10"  (1.778 m)  Wt 174 lb (78.926 kg)  BMI 24.97 kg/m2  SpO2 94% GEN- NAD, alert and oriented x3 HEENT- PERRL, EOMI, non injected sclera, pink conjunctiva, MMM, oropharynx + post nasal drip, no maxillary sinus tenderness, clear rhinorrhea  Neck- Supple, no LAD CVS- RRR, no murmur RESP- upper airway congestion, no wheeze or broncospasm, sats decreased at 94% EXT- No edema, left knee in brace  Pulses- Radial 2+        Assessment & Plan:      Problem List Items Addressed This Visit    None    Visit Diagnoses    Simple chronic bronchitis ([place])    -  Primary    COPD exacerabtion with mild bronchitis, ha albuterol at home, will add prednisone and doxycycline as recent surgery,  worsening symptoms        Note: This dictation was prepared with Dragon dictation along with smaller phrase technology. Any transcriptional errors that result from this process are unintentional.

## 2015-09-25 ENCOUNTER — Ambulatory Visit (HOSPITAL_COMMUNITY): Payer: Medicare Other | Attending: Orthopedic Surgery | Admitting: Physical Therapy

## 2015-09-25 DIAGNOSIS — R29898 Other symptoms and signs involving the musculoskeletal system: Secondary | ICD-10-CM | POA: Diagnosis not present

## 2015-09-25 DIAGNOSIS — M25562 Pain in left knee: Secondary | ICD-10-CM | POA: Diagnosis not present

## 2015-09-25 DIAGNOSIS — R269 Unspecified abnormalities of gait and mobility: Secondary | ICD-10-CM | POA: Diagnosis not present

## 2015-09-25 NOTE — Therapy (Signed)
[place], [place], [address] Phone: [phone]   Fax:  [phone]  Physical Therapy Evaluation  Patient Details  Name: Benjamin [name] MRN: [id number] Date of Birth: 10-Aug-1941 Referring Provider: Dr. Lorenz Coaster  Encounter Date: 09/25/2015      PT End of Session - 09/25/15 1427    Visit Number 1   Number of Visits 6   Date for PT Re-Evaluation 10/16/15   Authorization Type medicare   PT Start Time 1350   PT Stop Time 1426   PT Time Calculation (min) 36 min   Activity Tolerance Patient tolerated treatment well      Past Medical History  Diagnosis Date  . Enlarged prostate   . Vision problems     Blind x 20 years, regained site 2000, ? optic nerve injury  . Abnormal EKG     History per report of ST elevations x 20 years, no cardiac history-anomale with aorta-sts "it always looks like i am having a heart atttack on my EKG  . Headache(784.0)     last one 5 yrs ago  . Arthritis   . COPD (chronic obstructive pulmonary disease) [facility])     Past Surgical History  Procedure Laterality Date  . Appendectomy      age 76  . Right shoulder    . Left elbow    . Left thumb    . Cataract extraction w/phaco  07/27/2012    Procedure: CATARACT EXTRACTION PHACO AND INTRAOCULAR LENS PLACEMENT (IOC);  Surgeon: [name], MD;  Location: [facility];  Service: Ophthalmology;  Laterality: Right;  CDE: 12.55    There were no vitals filed for this visit.  Visit Diagnosis:  Abnormality of gait - Plan: PT plan of care cert/re-cert  Left leg weakness - Plan: PT plan of care cert/re-cert  Knee pain, acute, left - Plan: PT plan of care cert/re-cert      Subjective Assessment - 09/25/15 1352    Subjective Mr. Geyman states that he had  arthroscopic surgery on his Lt knee on 09/20/2015.  He is doing much better but is still having some limitations.     How long can you sit comfortably? no limitation   How long can you stand comfortably?  30 minutes no problem   How long can you walk comfortably? using a cane to ambulate with now which he wasn't; able to be on his feet for 20 minutes.    Currently in Pain? No/denies   Multiple Pain Sites No            [facility] PT Assessment - 09/25/15 0001    Assessment   Medical Diagnosis Lt medial menisectomy   Referring Provider Dr. Lorenz Coaster   Onset Date/Surgical Date 09/20/15   Hand Dominance Right   Next MD Visit 09/28/2015   Prior Therapy none   Precautions   Precautions None   Restrictions   Weight Bearing Restrictions No   Balance Screen   Has the patient fallen in the past 6 months Yes   How many times? 1   Has the patient had a decrease in activity level because of a fear of falling?  Yes   Is the patient reluctant to leave their home because of a fear of falling?  Yes   [place] residence   Home Access Stairs to enter   Entrance Stairs-Number of Steps 6  does not complete reciprocally    Prior Function  Level of Independence Independent   Vocation Retired   Leisure walking the dog 2x a day 1/4/ a mile; computers, Information systems manager   Overall Cognitive Status Within Functional Limits for tasks assessed   Observation/Other Assessments   Focus on Therapeutic Outcomes (FOTO)  43   Functional Tests   Functional tests Single leg stance;Sit to Stand   Single Leg Stance   Comments Rt 40 seconds Lt 30 seconds    Sit to Stand   Comments 10.03 average for age is 12.6    ROM / Strength   AROM / PROM / Strength AROM;Strength   AROM   AROM Assessment Site Knee   Right/Left Knee Left   Left Knee Extension 0   Left Knee Flexion 130   Strength   Strength Assessment Site Hip;Knee;Ankle   Right/Left Hip Left   Left Hip Extension 5/5   Right/Left Knee Left   Left Knee Flexion 4-/5   Left Knee Extension 5/5   Right/Left Ankle Left   Left Ankle Dorsiflexion 5/5   Standardized Balance Assessment   Standardized Balance Assessment  --                   [facility] Adult PT Treatment/Exercise - 09/25/15 1425    Knee/Hip Exercises: Standing   Heel Raises Both;5 reps   SLS x3   Knee/Hip Exercises: Seated   Sit to Sand 5 reps   Knee/Hip Exercises: Supine   Quad Sets 5 sets   Bridges Both;5 reps   Knee/Hip Exercises: Prone   Hamstring Curl 5 reps                PT Education - 09/25/15 1427    Education provided Yes   Education Details HEP   Person(s) Educated Patient   Methods Explanation   Comprehension Verbalized understanding          PT Short Term Goals - 09/25/15 1436    PT SHORT TERM GOAL #1   Title I in HEP in order to get pt back to ambulating without a cane as soon as possible.    Time 10   Period Days   Status New   PT SHORT TERM GOAL #2   Title Pt hamstring strength to improve 1/2 grade  so that pt is able to go up and down steps in a reciprocal manner    Time 10   Period Days   Status New   PT SHORT TERM GOAL #3   Title Pt pain to be 2 or less with all functional activity to improve pt quality of life.    Time 10   Period Days   Status New           PT Long Term Goals - 09/25/15 1438    PT LONG TERM GOAL #1   Title Pt to be I in advance HEP in order to allow pt to complete all previous activites without pain    Time 3   Period Weeks   PT LONG TERM GOAL #2   Title Pt to be walking his dogs 1/4 mile twice a day    Time 3   Period Weeks   PT LONG TERM GOAL #3   Title Pt hamstring strength to be improved one grade to allow pt to squat to get objects in lower cabinets and return from squatted position   Time 3   Period Weeks   PT LONG TERM GOAL #4   Title Pt  pain to be no greater than a 1 with all normal daily activities to improve pt quality of life    Time 3   Period Weeks               Plan - 10-Oct-2015 1428    Clinical Impression Statement Pt is a 75 yo male who had a medial meniscus repari on his Lt Le on 09/20/2015.  He has been referred to skilled  PT to maximize his functional ability.  At this time Mr. Bithell is amubulating with a cane and is not going up or down his steps in a reciprocal manner.  Examination demonstrates decreased hamstring strength, decreased balance decreased eccentric control of quadriceps causing difficulty going down steps.  Mr. Harsh will benefit from skilled PT to progress him through HEP to address his current deficits and maximize his functional ability.     Pt will benefit from skilled therapeutic intervention in order to improve on the following deficits Abnormal gait;Decreased balance;Decreased activity tolerance;Decreased strength;Difficulty walking;Pain   Rehab Potential Good   Clinical Impairments Affecting Rehab Potential none    PT Frequency 2x / week   PT Duration 3 weeks   PT Treatment/Interventions Gait training;Stair training;Therapeutic activities;Therapeutic exercise;Balance training;Patient/family education   PT Next Visit Plan begin ambulation without cane. rocker board, standing hamstring curl with weights; add 5# to prone ham cul, vector stance as well as steps.    PT Home Exercise Plan given          G-Codes - 10-Oct-2015 1441    Functional Assessment Tool Used foto   Functional Limitation Mobility: Walking and moving around   Mobility: Walking and Moving Around Current Status [phone]) At least 40 percent but less than 60 percent impaired, limited or restricted   Mobility: Walking and Moving Around Goal Status [phone]) At least 20 percent but less than 40 percent impaired, limited or restricted       Problem List Patient Active Problem List   Diagnosis Date Noted  . Seborrheic dermatitis of scalp 01/11/2014  . DDD (degenerative disc disease), lumbar 01/11/2014  . Rash and nonspecific skin eruption 09/14/2013  . Lymphadenitis 05/02/2013  . Decreased reflex 02/18/2013  . Paresthesia of left leg 02/18/2013  . Borderline diabetes 02/18/2013  . Mild hyperlipidemia 02/18/2013  . Pain in  joint, lower leg 11/13/2012  . Hip pain 11/13/2012  . BPH (benign prostatic hyperplasia) 03/09/2012  . Cataract 03/09/2012  . Tobacco user 03/09/2012   Rayetta Humphrey, PT CLT [phone] Oct 10, 2015, 2:45 PM  [place] [address] [place], [place], [address] Phone: [phone]   Fax:  [phone]  Name: Kindell Kreitler MRN: [id number] Date of Birth: 13-Jan-1941

## 2015-09-25 NOTE — Patient Instructions (Signed)
Strengthening: Quadriceps Set    Tighten muscles on top of thighs by pushing knees down into surface. Hold __3__ seconds. Repeat __10__ times per set. Do __1__ sets per session. Do ___2_ sessions per day.  http://orth.exer.us/602   Copyright  VHI. All rights reserved.  Self-Mobilization: Knee Flexion (Prone)   With 4# on your ankle  Bring left heel toward buttocks as close as possible. Hold __3__ seconds. Relax. Repeat __10-15__ times per set. Do __1__ sets per session. Do __2__ sessions per day.  http://orth.exer.us/596   Copyright  VHI. All rights reserved.  Heel Raise: Bilateral (Standing)    Rise on balls of feet. Repeat _10___ times per set. Do ___1_ sets per session. Do __2__ sessions per day.  http://orth.exer.us/38   Copyright  VHI. All rights reserved.  Heel Raise: Unilateral (Standing)    Balance on left foot,  Repeat __40__ times per set. Do _3___ sets per session. Do __2__ sessions per day.  http://orth.exer.us/40   Copyright  VHI. All rights reserved.  Bridging    Slowly raise buttocks from floor, keeping stomach tight. Repeat __10__ times per set. Do __1__ sets per session. Do __2__ sessions per day.  http://orth.exer.us/1096   Copyright  VHI. All rights reserved.

## 2015-09-28 ENCOUNTER — Encounter (HOSPITAL_COMMUNITY): Payer: Self-pay

## 2015-09-28 ENCOUNTER — Ambulatory Visit (HOSPITAL_COMMUNITY): Payer: Medicare Other

## 2015-09-28 DIAGNOSIS — R29898 Other symptoms and signs involving the musculoskeletal system: Secondary | ICD-10-CM | POA: Diagnosis not present

## 2015-09-28 DIAGNOSIS — R269 Unspecified abnormalities of gait and mobility: Secondary | ICD-10-CM

## 2015-09-28 DIAGNOSIS — S83242D Other tear of medial meniscus, current injury, left knee, subsequent encounter: Secondary | ICD-10-CM | POA: Diagnosis not present

## 2015-09-28 DIAGNOSIS — M25562 Pain in left knee: Secondary | ICD-10-CM | POA: Diagnosis not present

## 2015-09-28 NOTE — Patient Instructions (Signed)
Straight Leg Raise    Tighten stomach and slowly raise locked right leg __12__ inches from floor. Repeat __10__ times per set. Do _2___ sets per session. Do __1__ sessions per day.    Copyright  VHI. All rights reserved.

## 2015-09-28 NOTE — Therapy (Signed)
[place], [place], [address] Phone: [phone]   Fax:  [phone]  Physical Therapy Treatment  Patient Details  Name: Benjamin [name] MRN: [id number] Date of Birth: 11-19-1940 Referring Provider: Dr. Lorenz Coaster  Encounter Date: 09/28/2015      PT End of Session - 09/28/15 1422    Visit Number 2   Number of Visits 6   Date for PT Re-Evaluation 10/23/15   Authorization Type Medicare   PT Start Time 1346   PT Stop Time 1441   PT Time Calculation (min) 55 min   Activity Tolerance Patient tolerated treatment well;No increased pain   Behavior During Therapy [facility] for tasks assessed/performed      Past Medical History  Diagnosis Date  . Enlarged prostate   . Vision problems     Blind x 20 years, regained site 2000, ? optic nerve injury  . Abnormal EKG     History per report of ST elevations x 20 years, no cardiac history-anomale with aorta-sts "it always looks like i am having a heart atttack on my EKG  . Headache(784.0)     last one 5 yrs ago  . Arthritis   . COPD (chronic obstructive pulmonary disease) [facility])     Past Surgical History  Procedure Laterality Date  . Appendectomy      age 22  . Right shoulder    . Left elbow    . Left thumb    . Cataract extraction w/phaco  07/27/2012    Procedure: CATARACT EXTRACTION PHACO AND INTRAOCULAR LENS PLACEMENT (IOC);  Surgeon: [name], MD;  Location: [facility];  Service: Ophthalmology;  Laterality: Right;  CDE: 12.55    There were no vitals filed for this visit.  Visit Diagnosis:  Abnormality of gait  Left leg weakness  Knee pain, acute, left      Subjective Assessment - 09/28/15 1347    Subjective Pt reported that his L knee has been giving out since his last PT eval visit. Pt saw Dr. Lorenz Coaster for post-op f/u appt earlier today and noted that he was not concerned with the pt's c/o his knee giving out and attributed his complaints to muscle weakness. Pt denied  pain upon arrival. L knee pain has been minimal since last PT visit with pain increasing to a 6/10 on one occasion after pivoting on his L LE.    Limitations Standing;Walking   How long can you stand comfortably? 30 min   How long can you walk comfortably? 20 min with [facility]   Patient Stated Goals Pt's goal is to be able to ambulate without an AD and return to his PLOF.    Currently in Pain? No/denies   Pain Score 0-No pain   Pain Location Knee   Pain Orientation Left   Pain Descriptors / Indicators Aching   Pain Type Surgical pain   Pain Radiating Towards N/A   Pain Onset 1 to 4 weeks ago   Pain Frequency Occasional   Aggravating Factors  twisting and long distance ambulation    Pain Relieving Factors rest, pain meds, and elevation    Effect of Pain on Daily Activities Pt limited with long distance ambulation on level and unlevel terrain.    Multiple Pain Sites No                         [facility] Adult PT Treatment/Exercise - 09/28/15 0001    Knee/Hip Exercises: Standing  Knee Flexion Strengthening;1 set;10 reps;Left;Other (comment)  fatigue with 10 reps   Rocker Board 2 minutes;Other (comment)  A< >P direction with focus on slow transition   Knee/Hip Exercises: Supine   Quad Sets AROM;Left;1 set;15 reps   Bridges Both;10 reps;Strengthening;1 set   Straight Leg Raises 2 sets;10 reps;Strengthening;Left   Knee/Hip Exercises: Sidelying   Hip ABduction 2 sets;10 reps;Strengthening;Left   Knee/Hip Exercises: Prone   Hip Extension 2 sets;10 reps;Strengthening;Left   Manual Therapy   Joint Mobilization L knee patellar mobs, grade I-III in S<>I and M <>L direction   Passive ROM L knee PROM into flexion and extension x 15 reps with 5 sec hold at end-range                PT Education - 09/28/15 1420    Education provided Yes   Education Details Educated the patient on ice, L LE elevation, updated HEP, and proper heel to toe sequence with and without use of SPC    Person(s) Educated Patient   Methods Explanation;Demonstration;Verbal cues   Comprehension Verbalized understanding;Returned demonstration          PT Short Term Goals - 09/25/15 1436    PT SHORT TERM GOAL #1   Title I in HEP in order to get pt back to ambulating without a cane as soon as possible.    Time 10   Period Days   Status New   PT SHORT TERM GOAL #2   Title Pt hamstring strength to improve 1/2 grade  so that pt is able to go up and down steps in a reciprocal manner    Time 10   Period Days   Status New   PT SHORT TERM GOAL #3   Title Pt pain to be 2 or less with all functional activity to improve pt quality of life.    Time 10   Period Days   Status New           PT Long Term Goals - 09/25/15 1438    PT LONG TERM GOAL #1   Title Pt to be I in advance HEP in order to allow pt to complete all previous activites without pain    Time 3   Period Weeks   PT LONG TERM GOAL #2   Title Pt to be walking his dogs 1/4 mile twice a day    Time 3   Period Weeks   PT LONG TERM GOAL #3   Title Pt hamstring strength to be improved one grade to allow pt to squat to get objects in lower cabinets and return from squatted position   Time 3   Period Weeks   PT LONG TERM GOAL #4   Title Pt pain to be no greater than a 1 with all normal daily activities to improve pt quality of life    Time 3   Period Weeks               Plan - 09/28/15 1425    Clinical Impression Statement Pt was able to complete all ther ex and ther act without exacerbating L knee pain. Added manual therapy techniques to improve patellar mobility and L knee ROM. Improved patellar mobility assessed s/p manual therapy techniques. Good quad contraction assessed with quad sets and SLR ther ex. Pt was able to demo fair to good heel to toe sequence with and without the use of a SPC. Instructed the patient to use SPC on contralateral side and to focus  on heel to toe sequence. HEP was reviewed and updated this  visit. Pt is making steady progress towards stated PT goals and would benefit from continued skilled PT per POC in order to achieve stated PT goals.     Pt will benefit from skilled therapeutic intervention in order to improve on the following deficits Abnormal gait;Decreased range of motion;Difficulty walking;Decreased activity tolerance;Pain;Impaired flexibility;Decreased balance;Decreased mobility;Decreased strength   Rehab Potential Good   Clinical Impairments Affecting Rehab Potential COPD and hx of arthritis   PT Frequency 2x / week   PT Duration 3 weeks   PT Next Visit Plan Progress to [facility] quad strengthening ther ex, SLR 4-way with added resistance, dynamic standing balance activities, add weight with HS curls in standing, and L knee PROM.    PT Home Exercise Plan Added SLR into flexion with handout provided   Consulted and Agree with Plan of Care Patient        Problem List Patient Active Problem List   Diagnosis Date Noted  . Seborrheic dermatitis of scalp 01/11/2014  . DDD (degenerative disc disease), lumbar 01/11/2014  . Rash and nonspecific skin eruption 09/14/2013  . Lymphadenitis 05/02/2013  . Decreased reflex 02/18/2013  . Paresthesia of left leg 02/18/2013  . Borderline diabetes 02/18/2013  . Mild hyperlipidemia 02/18/2013  . Pain in joint, lower leg 11/13/2012  . Hip pain 11/13/2012  . BPH (benign prostatic hyperplasia) 03/09/2012  . Cataract 03/09/2012  . Tobacco user 03/09/2012    Garen Lah, PT, DPT    09/28/2015, 5:19 PM  [place] [address] [place], [place], [address] Phone: [phone]   Fax:  [phone]  Name: Benjamin [name] MRN: [id number] Date of Birth: [birth year]

## 2015-10-03 ENCOUNTER — Encounter (HOSPITAL_COMMUNITY): Payer: Self-pay

## 2015-10-05 ENCOUNTER — Ambulatory Visit (HOSPITAL_COMMUNITY): Payer: Medicare Other

## 2015-10-10 ENCOUNTER — Encounter (HOSPITAL_COMMUNITY): Payer: Self-pay

## 2015-10-10 ENCOUNTER — Ambulatory Visit (HOSPITAL_COMMUNITY): Payer: Medicare Other

## 2015-10-10 DIAGNOSIS — R29898 Other symptoms and signs involving the musculoskeletal system: Secondary | ICD-10-CM

## 2015-10-10 DIAGNOSIS — R269 Unspecified abnormalities of gait and mobility: Secondary | ICD-10-CM | POA: Diagnosis not present

## 2015-10-10 DIAGNOSIS — M25562 Pain in left knee: Secondary | ICD-10-CM | POA: Diagnosis not present

## 2015-10-10 NOTE — Patient Instructions (Signed)
Heel Slides    Squeeze pelvic floor and hold. Slide left heel along bed towards bottom. Hold for 2 seconds. Slide back to flat knee position. Repeat 10 times. Do 2 times a day.     Copyright  VHI. All rights reserved.

## 2015-10-10 NOTE — Therapy (Signed)
[place], [place], [address] Phone: [phone]   Fax:  [phone]  Physical Therapy Treatment  Patient Details  Name: Benjamin [name] MRN: [id number] Date of Birth: [birth year] Referring Provider: Dr. Lorenz Coaster  Encounter Date: 10/10/2015      PT End of Session - 10/10/15 1815    Visit Number 3   Number of Visits 6   Date for PT Re-Evaluation 10/23/15   Authorization Type Medicare   PT Start Time 1300   PT Stop Time [id number]   PT Time Calculation (min) 55 min   Activity Tolerance Other (comment);Patient tolerated treatment well  Treatment limited due to recent pain exacerbation of the L knee and hip   Behavior During Therapy [facility] for tasks assessed/performed      Past Medical History  Diagnosis Date  . Enlarged prostate   . Vision problems     Blind x 20 years, regained site 2000, ? optic nerve injury  . Abnormal EKG     History per report of ST elevations x 20 years, no cardiac history-anomale with aorta-sts "it always looks like i am having a heart atttack on my EKG  . Headache(784.0)     last one 5 yrs ago  . Arthritis   . COPD (chronic obstructive pulmonary disease) [facility])     Past Surgical History  Procedure Laterality Date  . Appendectomy      age 63  . Right shoulder    . Left elbow    . Left thumb    . Cataract extraction w/phaco  07/27/2012    Procedure: CATARACT EXTRACTION PHACO AND INTRAOCULAR LENS PLACEMENT (IOC);  Surgeon: [name], MD;  Location: [facility];  Service: Ophthalmology;  Laterality: Right;  CDE: 12.55    There were no vitals filed for this visit.  Visit Diagnosis:  Abnormality of gait  Left leg weakness  Knee pain, acute, left      Subjective Assessment - 10/10/15 1305    Subjective Pt c/o L knee pain that was rated 5/10 on a VAS upon arrival. Pt reported that he discontinued his Norco medication due to side effects; therefore, he is currently taking OTC Tylenol to manage his  pain with fair relief reported. He further reports use of ice to the L knee to manage his symptoms on a daily basis with fair to good relief reported per pt. Pt admitted inconsistent use of his SPC when ambulating indoors and outdoors. L knee pain ranges between a 0-7/10 on a VAS. In addition, pt reports that his L hip has been hurting as well since initiating SLR ther ex and has avoided all exercises since his last PT visit. Pt noted that he "felt just fine" during and immediately after his last PT visit; however, later that evening he had increased L knee and hip soreness/pain. L hip pain has ranged between a 0-6/10 on a VAS, which is located over the lateral gluteal region and just inferior to the iliac crest.    Pertinent History L mensical tear, arthritis, and COPD    Limitations Walking   How long can you sit comfortably? no limitation   How long can you stand comfortably? 30 min   How long can you walk comfortably? 20 min with [facility]   Patient Stated Goals Pt's goal is to be able to ambulate without an AD, reduce his L knee pain, and return to his PLOF.    Currently in Pain? Yes   Pain  Score 5    Pain Location Knee   Pain Orientation Left;Medial;Lateral   Pain Descriptors / Indicators Aching   Pain Type Surgical pain   Pain Radiating Towards N/A    Pain Onset 1 to 4 weeks ago   Pain Frequency Intermittent   Aggravating Factors  transition from sit<>stand and standing    Pain Relieving Factors rest, OTC pain meds, cryotherapy, and elevation    Effect of Pain on Daily Activities Pt limited with long distance ambulation on level and unlevel terrain    Multiple Pain Sites Yes   Pain Score 1   Pain Location Hip   Pain Orientation Left   Pain Descriptors / Indicators Aching;Sharp   Pain Type Acute pain   Pain Radiating Towards lateral hip    Pain Onset 1 to 4 weeks ago   Pain Frequency Intermittent   Aggravating Factors  ambulation    Pain Relieving Factors sitting, supine, rest, and OTC  pain meds    Effect of Pain on Daily Activities limited with ambulation and standing activities                          [facility] Adult PT Treatment/Exercise - 10/10/15 0001    Knee/Hip Exercises: Standing   Heel Raises 1 set   Knee/Hip Exercises: Supine   Quad Sets AROM;1 set;15 reps;Left   Heel Slides 1 set;AAROM;Left;15 reps   Modalities   Modalities Cryotherapy  10 minutes at the end of PT tx with L LE elevated on wedge   Manual Therapy   Edema Management L knee retrograde massage with LE elevated onto 2 pillows   Joint Mobilization L knee patellar mobs, grade I-II2 in S<>I and M <>L direction;    Soft tissue mobilization L quad x 3 minutes    Passive ROM L knee PROM into flexion and extension x 15 reps with 5 sec hold at end-range                PT Education - 10/10/15 1320    Education provided Yes   Education Details Educated the patient on cryotherapy ( 15-20 minutes wiht LE elevated, 3-4x/day), use of SPC to assist with pain management, L LE elevation 3-4x/day to manage edema, and to call surgeon to disucss current complaints and requst new pain medications.    Person(s) Educated Patient   Methods Explanation;Demonstration;Verbal cues   Comprehension Verbalized understanding;Returned demonstration          PT Short Term Goals - 09/25/15 1436    PT SHORT TERM GOAL #1   Title I in HEP in order to get pt back to ambulating without a cane as soon as possible.    Time 10   Period Days   Status New   PT SHORT TERM GOAL #2   Title Pt hamstring strength to improve 1/2 grade  so that pt is able to go up and down steps in a reciprocal manner    Time 10   Period Days   Status New   PT SHORT TERM GOAL #3   Title Pt pain to be 2 or less with all functional activity to improve pt quality of life.    Time 10   Period Days   Status New           PT Long Term Goals - 09/25/15 1438    PT LONG TERM GOAL #1   Title Pt to be I in advance HEP in  order  to allow pt to complete all previous activites without pain    Time 3   Period Weeks   PT LONG TERM GOAL #2   Title Pt to be walking his dogs 1/4 mile twice a day    Time 3   Period Weeks   PT LONG TERM GOAL #3   Title Pt hamstring strength to be improved one grade to allow pt to squat to get objects in lower cabinets and return from squatted position   Time 3   Period Weeks   PT LONG TERM GOAL #4   Title Pt pain to be no greater than a 1 with all normal daily activities to improve pt quality of life    Time 3   Period Weeks               Plan - 10/10/15 1816    Clinical Impression Statement PT tx was limited this visit secondary to L knee and hip pain exacerbation. PT tx was focused on pain and edema management. Completed quad sets and heel slides as documented with good tolerance reported per pt. Initiated and completed retrograde massage to the L knee with the L LE elevated. Reviewed HEP with instructions provided to hold all previous exercises with focus on heel slides and quad sets. Pt verbalized full understanding. Completed L knee PROM with good mobility assessed and measured, which was 0-136 degrees in supine. Therapist instructed pt to call his surgeon to discuss his current complaints and to request for a change in his pain meds to improve pain management. Further instructed the pt to use his SPC at all times in order to off weight his L knee and hip. Pt verbalized full understanding and agreement with therapist instructions. Ended PT tx with application of an ice pack to the L knee in an elevated position for 10 minutes. Pt reported decreased L knee pain from 5/10 on a VAS to 1/10 on a VAS post tx. Pt reported improved L LE WB tolerance at the completion of PT tx. Pt would benefit from continued skilled PT to address current deficits.    Pt will benefit from skilled therapeutic intervention in order to improve on the following deficits Abnormal gait;Decreased range of  motion;Difficulty walking;Decreased activity tolerance;Pain;Impaired flexibility;Decreased balance;Decreased mobility;Decreased strength;Increased edema   Rehab Potential Good   Clinical Impairments Affecting Rehab Potential hx of arthritis    PT Frequency 2x / week   PT Duration 3 weeks   PT Treatment/Interventions Gait training;Stair training;Therapeutic activities;Therapeutic exercise;Balance training;Patient/family education   PT Next Visit Plan Reassess L knee and hip pain and progress as tolerated. Review and update HEP at next PT visit.    PT Home Exercise Plan Updated HEP with instructions to complete only quad sets and heel slides with focus on pain and edema management.    Consulted and Agree with Plan of Care Patient        Problem List Patient Active Problem List   Diagnosis Date Noted  . Seborrheic dermatitis of scalp 01/11/2014  . DDD (degenerative disc disease), lumbar 01/11/2014  . Rash and nonspecific skin eruption 09/14/2013  . Lymphadenitis 05/02/2013  . Decreased reflex 02/18/2013  . Paresthesia of left leg 02/18/2013  . Borderline diabetes 02/18/2013  . Mild hyperlipidemia 02/18/2013  . Pain in joint, lower leg 11/13/2012  . Hip pain 11/13/2012  . BPH (benign prostatic hyperplasia) 03/09/2012  . Cataract 03/09/2012  . Tobacco user 03/09/2012    Garen Lah, PT, DPT  10/10/2015, 6:31 PM  [place] [address] [place], [place], [address] Phone: [phone]   Fax:  [phone]  Name: Benjamin [name] MRN: [id number] Date of Birth: 08-16-1941

## 2015-10-13 ENCOUNTER — Ambulatory Visit (HOSPITAL_COMMUNITY): Payer: Medicare Other | Admitting: Physical Therapy

## 2015-10-13 DIAGNOSIS — M25562 Pain in left knee: Secondary | ICD-10-CM | POA: Diagnosis not present

## 2015-10-13 DIAGNOSIS — R269 Unspecified abnormalities of gait and mobility: Secondary | ICD-10-CM | POA: Diagnosis not present

## 2015-10-13 DIAGNOSIS — R29898 Other symptoms and signs involving the musculoskeletal system: Secondary | ICD-10-CM | POA: Diagnosis not present

## 2015-10-13 NOTE — Patient Instructions (Signed)
Knee Extension (Sitting)    Place __0__ pound weight on left ankle and straighten knee fully, lower slowly. Repeat __10__ times per set. Do __1__ sets per session. Do __2__ sessions per day.  http://orth.exer.us/732   Copyright  VHI. All rights reserved.  Strengthening: Quadriceps Set    Tighten muscles on top of thighs by pushing knees down into surface. Hold __3__ seconds. Repeat _10___ times per set. Do 1____ sets per session. Do __3__ sessions per day.  http://orth.exer.us/602   Copyright  VHI. All rights reserved.  Strengthening: Terminal Knee Extension (Supine)    With leftt knee over bolster, straighten knee by tightening muscles on top of thigh. Keep bottom of knee on bolster. Repeat __10__ times per set. Do __0__ sets per session. Do __3__ sessions per day.  http://orth.exer.us/626   Copyright  VHI. All rights reserved.  Strengthening: Straight Leg Raise (Phase 1)    Tighten muscles on front of right thigh, then lift leg __15__ inches from surface, keeping knee locked.  Repeat __10__ times per set. Do __1__ sets per session. Do __2__ sessions per day.  http://orth.exer.us/614   Copyright  VHI. All rights reserved.  Strengthening: Hip Abduction (Side-Lying)    Tighten muscles on front of left thigh, then lift leg _12___ inches from surface, keeping knee locked.  Repeat ___10_ times per set. Do _1___ sets per session. Do __2__ sessions per day.  http://orth.exer.us/622   Copyright  VHI. All rights reserved.  Stretching: Hamstring (Supine)    Supporting right thigh behind knee, slowly straighten knee until stretch is felt in back of thigh. Hold __20__ seconds. Repeat __3__ times per set. Do ___1_ sets per session. Do __3__ sessions per day.  http://orth.exer.us/656   Copyright  VHI. All rights reserved.

## 2015-10-13 NOTE — Therapy (Signed)
[place], [place], [address] Phone: [phone]   Fax:  [phone]  Physical Therapy Treatment  Patient Details  Name: Benjamin [name] MRN: [id number] Date of Birth: January 06, 1941 Referring Provider: Dr. Lorenz [name]  Encounter Date: 10/13/2015      PT End of Session - 10/13/15 1417    Visit Number 4   Number of Visits 6   Date for PT Re-Evaluation 10/23/15   Authorization Type Medicare   PT Start Time 1405  pt late for appointment   PT Stop Time 1430   PT Time Calculation (min) 25 min   Activity Tolerance No increased pain   Behavior During Therapy [facility] for tasks assessed/performed      Past Medical History  Diagnosis Date  . Enlarged prostate   . Vision problems     Blind x 20 years, regained site 2000, ? optic nerve injury  . Abnormal EKG     History per report of ST elevations x 20 years, no cardiac history-anomale with aorta-sts "it always looks like i am having a heart atttack on my EKG  . Headache(784.0)     last one 5 yrs ago  . Arthritis   . COPD (chronic obstructive pulmonary disease) [facility])     Past Surgical History  Procedure Laterality Date  . Appendectomy      age 43  . Right shoulder    . Left elbow    . Left thumb    . Cataract extraction w/phaco  07/27/2012    Procedure: CATARACT EXTRACTION PHACO AND INTRAOCULAR LENS PLACEMENT (IOC);  Surgeon: Benjamin Branch, MD;  Location: [facility];  Service: Ophthalmology;  Laterality: Right;  CDE: 12.55    There were no vitals filed for this visit.  Visit Diagnosis:  Abnormality of gait  Left leg weakness  Knee pain, acute, left      Subjective Assessment - 10/13/15 1407    Subjective Pt states that the ice is not helping his swelling.  He is having pain with weight bearing that goes up to a 5; non weight bearing is a 3.  He has been doing his exercises whch make his knee feel worse.  The only exercise he is doing is heelslides    Currently in Pain? Yes    Pain Score 5    Pain Location Knee   Pain Orientation Left   Pain Descriptors / Indicators Aching   Pain Type Surgical pain   Pain Onset More than a month ago   Pain Frequency Constant   Aggravating Factors  weight bearing    Pain Relieving Factors rest                         [facility] Adult PT Treatment/Exercise - 10/13/15 0001    Exercises   Exercises Knee/Hip   Knee/Hip Exercises: Stretches   Active Hamstring Stretch Left;2 reps;30 seconds   Knee/Hip Exercises: Seated   Long Arc Quad 5 reps   Knee/Hip Exercises: Supine   Quad Sets 10 reps   Short Arc Quad Sets 10 reps   Heel Slides 10 reps   Straight Leg Raises 10 reps   Knee/Hip Exercises: Sidelying   Hip ABduction 10 reps                PT Education - 10/13/15 1417    Education provided Yes   Education Details HEP    Person(s) Educated Patient   Methods Explanation;Handout  Comprehension Verbalized understanding;Returned demonstration          PT Short Term Goals - 09/25/15 1436    PT SHORT TERM GOAL #1   Title I in HEP in order to get pt back to ambulating without a cane as soon as possible.    Time 10   Period Days   Status New   PT SHORT TERM GOAL #2   Title Pt hamstring strength to improve 1/2 grade  so that pt is able to go up and down steps in a reciprocal manner    Time 10   Period Days   Status New   PT SHORT TERM GOAL #3   Title Pt pain to be 2 or less with all functional activity to improve pt quality of life.    Time 10   Period Days   Status New           PT Long Term Goals - 09/25/15 1438    PT LONG TERM GOAL #1   Title Pt to be I in advance HEP in order to allow pt to complete all previous activites without pain    Time 3   Period Weeks   PT LONG TERM GOAL #2   Title Pt to be walking his dogs 1/4 mile twice a day    Time 3   Period Weeks   PT LONG TERM GOAL #3   Title Pt hamstring strength to be improved one grade to allow pt to squat to get objects in  lower cabinets and return from squatted position   Time 3   Period Weeks   PT LONG TERM GOAL #4   Title Pt pain to be no greater than a 1 with all normal daily activities to improve pt quality of life    Time 3   Period Weeks               Plan - 10/13/15 1418    Clinical Impression Statement Pt continues to have increased pain he does have some swelling which tends to be suprapatellar.  He was encouraged to continue to ice and given additional exercises to do with his HEP.  PT was 69' late for appointment therefore treatment was limited this session.     PT Frequency 2x / week   PT Duration 3 weeks   PT Next Visit Plan begin weightbearing activity with heel rasies and minisquats.         Problem List Patient Active Problem List   Diagnosis Date Noted  . Seborrheic dermatitis of scalp 01/11/2014  . DDD (degenerative disc disease), lumbar 01/11/2014  . Rash and nonspecific skin eruption 09/14/2013  . Lymphadenitis 05/02/2013  . Decreased reflex 02/18/2013  . Paresthesia of left leg 02/18/2013  . Borderline diabetes 02/18/2013  . Mild hyperlipidemia 02/18/2013  . Pain in joint, lower leg 11/13/2012  . Hip pain 11/13/2012  . BPH (benign prostatic hyperplasia) 03/09/2012  . Cataract 03/09/2012  . Tobacco user 03/09/2012    Benjamin [name], PT CLT [phone] 10/13/2015, 2:33 PM  [name] [address] [place], [place], [address] Phone: [phone]   Fax:  [phone]  Name: Benjamin [name] MRN: [id number] Date of Birth: Mar 13, 1941

## 2015-10-17 ENCOUNTER — Ambulatory Visit (HOSPITAL_COMMUNITY): Payer: Medicare Other

## 2015-10-17 ENCOUNTER — Encounter (HOSPITAL_COMMUNITY): Payer: Self-pay | Admitting: Physical Therapy

## 2015-10-17 DIAGNOSIS — R29898 Other symptoms and signs involving the musculoskeletal system: Secondary | ICD-10-CM | POA: Diagnosis not present

## 2015-10-17 DIAGNOSIS — R269 Unspecified abnormalities of gait and mobility: Secondary | ICD-10-CM

## 2015-10-17 DIAGNOSIS — M25562 Pain in left knee: Secondary | ICD-10-CM | POA: Diagnosis not present

## 2015-10-17 NOTE — Therapy (Signed)
[place], [place], [address] Phone: [phone]   Fax:  [phone]  Physical Therapy Treatment  Patient Details  Name: Benjamin [name] MRN: [id number] Date of Birth: 03/09/1941 Referring Provider: Dr. Lorenz Coaster  Encounter Date: 10/17/2015      PT End of Session - 10/17/15 1349    Visit Number 5   Number of Visits 6   Date for PT Re-Evaluation 10/23/15   Authorization Type Medicare   PT Start Time 1343   PT Stop Time 1436   PT Time Calculation (min) 53 min   Activity Tolerance Patient limited by pain   Behavior During Therapy [facility] for tasks assessed/performed      Past Medical History  Diagnosis Date  . Enlarged prostate   . Vision problems     Blind x 20 years, regained site 2000, ? optic nerve injury  . Abnormal EKG     History per report of ST elevations x 20 years, no cardiac history-anomale with aorta-sts "it always looks like i am having a heart atttack on my EKG  . Headache(784.0)     last one 5 yrs ago  . Arthritis   . COPD (chronic obstructive pulmonary disease) [facility])     Past Surgical History  Procedure Laterality Date  . Appendectomy      age 70  . Right shoulder    . Left elbow    . Left thumb    . Cataract extraction w/phaco  07/27/2012    Procedure: CATARACT EXTRACTION PHACO AND INTRAOCULAR LENS PLACEMENT (IOC);  Surgeon: [name], MD;  Location: [facility];  Service: Ophthalmology;  Laterality: Right;  CDE: 12.55    There were no vitals filed for this visit.  Visit Diagnosis:  Abnormality of gait  Left leg weakness  Knee pain, acute, left      Subjective Assessment - 10/17/15 1338    Subjective Pt stated Lt knee feels like it has been stabbed and then twisted, pain scale 5/10.  Reports twisted Saturday afternoon preparing for a speech, increased pain following.   Pertinent History L mensical tear, arthritis, and COPD    Patient Stated Goals Pt's goal is to be able to ambulate without  an AD, reduce his L knee pain, and return to his PLOF.    Currently in Pain? Yes   Pain Score 5    Pain Location Knee   Pain Orientation Left   Pain Descriptors / Indicators Stabbing  Twisted               [facility] Adult PT Treatment/Exercise - 10/17/15 0001    Knee/Hip Exercises: Stretches   Active Hamstring Stretch Left;3 reps;30 seconds   Active Hamstring Stretch Limitations supine with rope   Knee/Hip Exercises: Standing   Heel Raises 15 reps   Heel Raises Limitations Toe raises 15x   Functional Squat Limitations minisquats- held due to pt. refusing to do any exercise that bends knee   Rocker Board Limitations   Rocker Board Limitations Refused to complete due to knee flexion   SLS x3   Gait Training Educated on proper handplacement with SPC gait, heel to toe and equal stance phase x 260ft   Knee/Hip Exercises: Supine   Quad Sets 15 reps   Short Arc Quad Sets AAROM;10 reps   Short Arc Quad Sets Limitations limited by pain   Heel Slides Limitations   Heel Slides Limitations held due to increased pain with knee flexion this session  Straight Leg Raises 15 reps   Knee/Hip Exercises: Sidelying   Hip ABduction 15 reps   Knee/Hip Exercises: Prone   Hip Extension 10 reps   Manual Therapy   Manual Therapy Edema management;Soft tissue mobilization   Edema Management L knee retrograde massage with LE elevated onto 2 pillows   Soft tissue mobilization Lt quad                  PT Short Term Goals - 09/25/15 1436    PT SHORT TERM GOAL #1   Title I in HEP in order to get pt back to ambulating without a cane as soon as possible.    Time 10   Period Days   Status New   PT SHORT TERM GOAL #2   Title Pt hamstring strength to improve 1/2 grade  so that pt is able to go up and down steps in a reciprocal manner    Time 10   Period Days   Status New   PT SHORT TERM GOAL #3   Title Pt pain to be 2 or less with all functional activity to improve pt quality of life.     Time 10   Period Days   Status New           PT Long Term Goals - 09/25/15 1438    PT LONG TERM GOAL #1   Title Pt to be I in advance HEP in order to allow pt to complete all previous activites without pain    Time 3   Period Weeks   PT LONG TERM GOAL #2   Title Pt to be walking his dogs 1/4 mile twice a day    Time 3   Period Weeks   PT LONG TERM GOAL #3   Title Pt hamstring strength to be improved one grade to allow pt to squat to get objects in lower cabinets and return from squatted position   Time 3   Period Weeks   PT LONG TERM GOAL #4   Title Pt pain to be no greater than a 1 with all normal daily activities to improve pt quality of life    Time 3   Period Weeks               Plan - 10/17/15 1401    Clinical Impression Statement Pt limited by pain espeically with knee flexion following reports of "twisting knee" while walking over weekend.  Pt does continue to exhbit edema surrounding knee as well, reports ice every hour though doesn't feel that is helping with edema.  Retro massage complete for edema and pain control.  Pt refused to complete any exercises that involved knee flexion due to pain.  End of session pt instructed proper gait training with appropriate handplacement and max cueing for heel to toe pattern and knee flexion.     PT Next Visit Plan Reassess next session.  F/U on increased pain following "twisting" knee over weekend.        Problem List Patient Active Problem List   Diagnosis Date Noted  . Seborrheic dermatitis of scalp 01/11/2014  . DDD (degenerative disc disease), lumbar 01/11/2014  . Rash and nonspecific skin eruption 09/14/2013  . Lymphadenitis 05/02/2013  . Decreased reflex 02/18/2013  . Paresthesia of left leg 02/18/2013  . Borderline diabetes 02/18/2013  . Mild hyperlipidemia 02/18/2013  . Pain in joint, lower leg 11/13/2012  . Hip pain 11/13/2012  . BPH (benign prostatic hyperplasia) 03/09/2012  .  Cataract 03/09/2012  .  Tobacco user 03/09/2012   Ihor Austin, [place]; [place]  Aldona Lento 10/17/2015, 3:00 PM  [place] [address] [place], [place], [address] Phone: [phone]   Fax:  [phone]  Name: Benjamin [name] MRN: [id number] Date of Birth: [birth year]

## 2015-10-19 ENCOUNTER — Encounter (HOSPITAL_COMMUNITY): Payer: Self-pay | Admitting: Physical Therapy

## 2015-10-20 ENCOUNTER — Ambulatory Visit (INDEPENDENT_AMBULATORY_CARE_PROVIDER_SITE_OTHER): Payer: Medicare Other | Admitting: Family Medicine

## 2015-10-20 ENCOUNTER — Encounter: Payer: Self-pay | Admitting: Family Medicine

## 2015-10-20 VITALS — BP 128/68 | HR 72 | Temp 97.6°F | Resp 14 | Ht 70.0 in | Wt 173.0 lb

## 2015-10-20 DIAGNOSIS — J4489 Other specified chronic obstructive pulmonary disease: Secondary | ICD-10-CM | POA: Insufficient documentation

## 2015-10-20 DIAGNOSIS — R7303 Prediabetes: Secondary | ICD-10-CM

## 2015-10-20 DIAGNOSIS — N400 Enlarged prostate without lower urinary tract symptoms: Secondary | ICD-10-CM | POA: Diagnosis not present

## 2015-10-20 DIAGNOSIS — R7309 Other abnormal glucose: Secondary | ICD-10-CM | POA: Diagnosis not present

## 2015-10-20 DIAGNOSIS — E785 Hyperlipidemia, unspecified: Secondary | ICD-10-CM | POA: Diagnosis not present

## 2015-10-20 DIAGNOSIS — Z125 Encounter for screening for malignant neoplasm of prostate: Secondary | ICD-10-CM | POA: Diagnosis not present

## 2015-10-20 DIAGNOSIS — J410 Simple chronic bronchitis: Secondary | ICD-10-CM | POA: Diagnosis not present

## 2015-10-20 DIAGNOSIS — J449 Chronic obstructive pulmonary disease, unspecified: Secondary | ICD-10-CM | POA: Insufficient documentation

## 2015-10-20 LAB — COMPREHENSIVE METABOLIC PANEL
ALBUMIN: 4.2 g/dL (ref 3.6–5.1)
ALK PHOS: 48 U/L (ref 40–115)
ALT: 18 U/L (ref 9–46)
AST: 17 U/L (ref 10–35)
BILIRUBIN TOTAL: 0.9 mg/dL (ref 0.2–1.2)
BUN: 20 mg/dL (ref 7–25)
CHLORIDE: 105 mmol/L (ref 98–110)
CO2: 26 mmol/L (ref 20–31)
CREATININE: 0.94 mg/dL (ref 0.70–1.18)
Calcium: 9.7 mg/dL (ref 8.6–10.3)
Glucose, Bld: 76 mg/dL (ref 70–99)
Potassium: 4.4 mmol/L (ref 3.5–5.3)
SODIUM: 143 mmol/L (ref 135–146)
Total Protein: 6.7 g/dL (ref 6.1–8.1)

## 2015-10-20 LAB — LIPID PANEL
CHOL/HDL RATIO: 3.9 ratio (ref <=5.0)
Cholesterol: 179 mg/dL (ref 125–200)
HDL: 46 mg/dL (ref >=40)
LDL Cholesterol: 106 mg/dL (ref <130)
Triglycerides: 133 mg/dL (ref <150)
VLDL: 27 mg/dL (ref <30)

## 2015-10-20 LAB — CBC WITH DIFFERENTIAL/PLATELET
BASOS ABS: 0.1 10*3/uL (ref 0.0–0.1)
Basophils Relative: 1 % (ref 0–1)
EOS PCT: 6 % — AB (ref 0–5)
Eosinophils Absolute: 0.4 10*3/uL (ref 0.0–0.7)
HEMATOCRIT: 46.2 % (ref 39.0–52.0)
Hemoglobin: 15.5 g/dL (ref 13.0–17.0)
LYMPHS PCT: 32 % (ref 12–46)
Lymphs Abs: 2 10*3/uL (ref 0.7–4.0)
MCH: 31.5 pg (ref 26.0–34.0)
MCHC: 33.5 g/dL (ref 30.0–36.0)
MCV: 93.9 fL (ref 78.0–100.0)
MPV: 9 fL (ref 8.6–12.4)
Monocytes Absolute: 0.6 10*3/uL (ref 0.1–1.0)
Monocytes Relative: 9 % (ref 3–12)
NEUTROS ABS: 3.3 10*3/uL (ref 1.7–7.7)
NEUTROS PCT: 52 % (ref 43–77)
Platelets: 313 10*3/uL (ref 150–400)
RBC: 4.92 MIL/uL (ref 4.22–5.81)
RDW: 14.1 % (ref 11.5–15.5)
WBC: 6.4 10*3/uL (ref 4.0–10.5)

## 2015-10-20 LAB — HEMOGLOBIN A1C
HEMOGLOBIN A1C: 6.1 % — AB (ref <5.7)
MEAN PLASMA GLUCOSE: 128 mg/dL — AB (ref <117)

## 2015-10-20 MED ORDER — TAMSULOSIN HCL 0.4 MG PO CAPS: 0.4000 mg | ORAL_CAPSULE | Freq: Every day | ORAL | Status: DC

## 2015-10-20 MED ORDER — BUDESONIDE-FORMOTEROL FUMARATE 80-4.5 MCG/ACT IN AERO: 2.0000 | INHALATION_SPRAY | Freq: Two times a day (BID) | RESPIRATORY_TRACT | Status: DC

## 2015-10-20 MED ORDER — TRAMADOL HCL 50 MG PO TABS: ORAL_TABLET | ORAL | Status: DC

## 2015-10-20 NOTE — Assessment & Plan Note (Signed)
Check lipids, normal weight,

## 2015-10-20 NOTE — Progress Notes (Signed)
Patient ID: Benjamin [name], male   DOB: 02-11-1941, 75 y.o.   MRN: [id number]    Subjective:    Patient ID: Benjamin [name], male    DOB: 10/04/1940, 75 y.o.   MRN: [id number]  Patient presents for 3 month F/U   patient here for follow-up. He has his history of COPD but he declines taking maintenance inhalers. I did treat her for bronchitis last month with prednisone and antibiotics and he had albuterol at home. Continues to have these episodes where he feels a cold-like sensation in the middle of his chest his oxygen sats will decrease around 9192% and he feels short of breath. He states this may last for about 30 minutes when he takes deep breaths it improves. He denies any actual chest pains denies any nausea or vomiting associated no palpitations. He denies any heartburn symptoms states the symptoms can come on at any time whether he is exerting himself or not.  he recently had knee surgery he does request refill on his tramadol he has follow-up with orthopedics next Friday, he still has some swelling in the knee.  He has history of borderline diabetes as well as mild hyperlipidemia his last set of labs 2 years ago were normal. He is due for repeat fasting labs.    Review Of Systems:  GEN- denies fatigue, fever, weight loss,weakness, recent illness HEENT- denies eye drainage, change in vision, nasal discharge, CVS- denies chest pain, palpitations RESP- denies SOB, cough, wheeze ABD- denies N/V, change in stools, abd pain GU- denies dysuria, hematuria, dribbling, incontinence MSK-+ joint pain, muscle aches, injury Neuro- denies headache, dizziness, syncope, seizure activity       Objective:    BP 128/68 mmHg  Pulse 72  Temp(Src) 97.6 F (36.4 C) (Oral)  Resp 14  Ht 5\' 10"  (1.778 m)  Wt 173 lb (78.472 kg)  BMI 24.82 kg/m2 GEN- NAD, alert and oriented x3 HEENT- PERRL, EOMI, non injected sclera, pink conjunctiva, MMM, oropharynx clear Neck- Supple, no thyromegaly CVS- RRR, no  murmur RESP-CTAB EXT- mild swelling left knee, fair ROM Pulses- Radial - 2+        Assessment & Plan:      Problem List Items Addressed This Visit    Mild hyperlipidemia    Check lipids, normal weight,      Relevant Orders   Lipid panel   COPD (chronic obstructive pulmonary disease) (HCC)    His symptoms are difficult to undestand, not true cardiac, but with oxygen sat changes ? Related to some bronchospasm Trial of Symbicort He has been tobacco free for 10 months now      Relevant Medications   budesonide-formoterol (SYMBICORT) 80-4.5 MCG/ACT inhaler   BPH (benign prostatic hyperplasia)    Check PSA, continue flomax      Relevant Medications   tamsulosin (FLOMAX) 0.4 MG CAPS capsule   Other Relevant Orders   PSA, Medicare   Borderline diabetes - Primary   Relevant Orders   Comprehensive metabolic panel   Hemoglobin A1c   CBC with Differential/Platelet    Other Visit Diagnoses    Prostate cancer screening        Relevant Orders    PSA, Medicare       Note: This dictation was prepared with Dragon dictation along with smaller phrase technology. Any transcriptional errors that result from this process are unintentional.

## 2015-10-20 NOTE — Patient Instructions (Addendum)
F/u 6 MONTHS FOR physical  

## 2015-10-20 NOTE — Assessment & Plan Note (Signed)
Check PSA, continue flomax

## 2015-10-20 NOTE — Assessment & Plan Note (Addendum)
His symptoms are difficult to undestand, possible cardiac and atypical symptoms, but with oxygen sat changes ? Related to some bronchospasm Trial of Symbicort He has been tobacco free for 10 months now

## 2015-10-21 LAB — PSA, MEDICARE: PSA: 3.64 ng/mL (ref <=4.00)

## 2015-10-23 ENCOUNTER — Ambulatory Visit (HOSPITAL_COMMUNITY): Payer: Medicare Other | Attending: Orthopedic Surgery

## 2015-10-23 ENCOUNTER — Encounter (HOSPITAL_COMMUNITY): Payer: Self-pay

## 2015-10-23 DIAGNOSIS — M25562 Pain in left knee: Secondary | ICD-10-CM | POA: Diagnosis not present

## 2015-10-23 DIAGNOSIS — R29898 Other symptoms and signs involving the musculoskeletal system: Secondary | ICD-10-CM | POA: Diagnosis not present

## 2015-10-23 DIAGNOSIS — R269 Unspecified abnormalities of gait and mobility: Secondary | ICD-10-CM | POA: Insufficient documentation

## 2015-10-23 NOTE — Therapy (Signed)
[place], [place], [address] Phone: [phone]   Fax:  [phone]  Physical Therapy Treatment/30-day Progress Note  Patient Details  Name: Benjamin [name] MRN: [id number] Date of Birth: March 10, 1941 Referring Provider: Dr. Lorenz Coaster  Encounter Date: 10/23/2015      PT End of Session - 10/23/15 1706    Visit Number 6   Number of Visits 16   Date for PT Re-Evaluation 11/22/15   Authorization Type Medicare   PT Start Time 1430   PT Stop Time 1525   PT Time Calculation (min) 55 min   Activity Tolerance Patient tolerated treatment well;Patient limited by pain   Behavior During Therapy [facility] for tasks assessed/performed      Past Medical History  Diagnosis Date  . Enlarged prostate   . Vision problems     Blind x 20 years, regained site 2000, ? optic nerve injury  . Abnormal EKG     History per report of ST elevations x 20 years, no cardiac history-anomale with aorta-sts "it always looks like i am having a heart atttack on my EKG  . Headache(784.0)     last one 5 yrs ago  . Arthritis   . COPD (chronic obstructive pulmonary disease) [facility])     Past Surgical History  Procedure Laterality Date  . Appendectomy      age 75  . Right shoulder    . Left elbow    . Left thumb    . Cataract extraction w/phaco  07/27/2012    Procedure: CATARACT EXTRACTION PHACO AND INTRAOCULAR LENS PLACEMENT (IOC);  Surgeon: [name], MD;  Location: [facility];  Service: Ophthalmology;  Laterality: Right;  CDE: 12.55    There were no vitals filed for this visit.  Visit Diagnosis:  Left leg weakness  Knee pain, acute, left  Abnormality of gait      Subjective Assessment - 10/23/15 1434    Subjective Pt reported a 60% improvement initially, but now rates his improvement 0% since twisting his L knee ~1 week while walking in the community. L knee pain rated a 8/10 on a VAS upon arrival. Pt noted that he has an appointment with Dr. Lorenz Coaster  on Friday, 10/27/15, to further address his complaints of increased L knee pain. Pt noted that he is frequently icing and elevating his L LE t/o the day with minimal completion of his HEP.     Pertinent History L mensical tear, arthritis, and COPD    Limitations Walking   How long can you sit comfortably? L knee pain with sitting >20 minutes    How long can you stand comfortably? L knee pain with > 20 min with SPC   How long can you walk comfortably? L knee pain with > 10 min with [facility]   Patient Stated Goals Pt's goal is to be able to ambulate without an AD, reduce his L knee pain, and return to his PLOF.    Currently in Pain? Yes   Pain Score 8   L knee pain 3-9/10 on a VAS   Pain Location Knee   Pain Orientation Left;Posterior;Medial   Pain Descriptors / Indicators Stabbing   Pain Type Surgical pain;Acute pain  acute pain is attributed to his recent exacerbation of L knee pain after twisting his knee while ambulating   Pain Radiating Towards N/A    Pain Onset More than a month ago   Pain Frequency Constant   Aggravating Factors  WB activities  Pain Relieving Factors rest, ice, and L LE elevation    Effect of Pain on Daily Activities Pt limited with long distance ambulation and prolonged standing             [facility] PT Assessment - 10/23/15 0001    Assessment   Medical Diagnosis Lt medial menisectomy   Onset Date/Surgical Date 09/20/15   Hand Dominance Right   Next MD Visit 10/27/2015   Prior Therapy --   Precautions   Precautions None   Restrictions   Weight Bearing Restrictions No   Home Environment   Living Environment Private residence   Home Access Stairs to enter   Entrance Stairs-Number of Steps 6  does not complete reciprocally    Prior Function   Level of Independence Independent   Vocation Retired       Associate Professor   Overall Cognitive Status Within Functional Limits for tasks assessed   Observation/Other Assessments   Focus on Therapeutic Outcomes (FOTO)  26% (  74% limited)   Functional Tests   Functional tests Single leg stance;Sit to Stand   Single Leg Stance   Comments NT per pt request secondary to increased L knee pain   Sit to Stand   Comments NT per pt request secondary to increased L knee pain   ROM / Strength   AROM / PROM / Strength AROM   AROM   Right/Left Knee Left   Left Knee Extension 0   Left Knee Flexion 131   Strength   Right/Left Hip Left   Left Hip Flexion 3+/5   Left Hip Extension 5/5   Right/Left Knee Left   Left Knee Flexion 4-/5   Left Knee Extension 4-/5   Right/Left Ankle Left   Left Ankle Dorsiflexion 5/5   Palpation   Palpation comment Tender to palpation  over the medial joint line of the L knee                     [facility] Adult PT Treatment/Exercise - 10/23/15 0001    Knee/Hip Exercises: Stretches   Active Hamstring Stretch Left;3 reps;30 seconds   Active Hamstring Stretch Limitations seated    Knee/Hip Exercises: Seated   Long Arc Quad Strengthening;1 set;10 reps;Left   Knee/Hip Exercises: Supine   Quad Sets 15 reps   Short Arc Quad Sets AROM;Strengthening;Left;1 set;15 reps   Heel Slides AAROM;Left;1 set;15 reps   Hip Adduction Isometric Strengthening;1 set;15 reps;Left   Modalities   Modalities Cryotherapy   Cryotherapy   Number Minutes Cryotherapy 10 Minutes   Cryotherapy Location Knee  L knee with L LE elvated; completed at the end of PT tx    Type of Cryotherapy Ice pack   Manual Therapy   Joint Mobilization L knee patellar mobs, grade I-III in S<>I and M <>L direction; L tibifemoral distraction in short sit position with knee at 90 degrees    Passive ROM L knee PROM into flexion and extension x 10 reps                PT Education - 10/23/15 1646    Education provided Yes   Education Details Educated the pt on pain management strategies, updated HEP, and to minimize twisting of the L knee while standing/walking   Person(s) Educated Patient   Methods  Explanation;Demonstration;Verbal cues   Comprehension Verbalized understanding;Returned demonstration          PT Short Term Goals - 10/23/15 1701    PT SHORT TERM GOAL #  1   Title I in HEP in order to get pt back to ambulating without a cane as soon as possible.    Time 10   Period Days   Status Partially Met   PT SHORT TERM GOAL #2   Title Pt hamstring strength to improve 1/2 grade  so that pt is able to go up and down steps in a reciprocal manner    Time 10   Period Days   Status On-going   PT SHORT TERM GOAL #3   Title Pt pain to be 2 or less with all functional activity to improve pt quality of life.    Time 10   Period Days   Status On-going           PT Long Term Goals - 10/23/15 1702    PT LONG TERM GOAL #1   Title Pt to be I in advance HEP in order to allow pt to complete all previous activites without pain    Time 3   Period Weeks   Status On-going  Not met as of 10/22/2014   PT LONG TERM GOAL #2   Title Pt to be walking his dogs 1/4 mile twice a day   Not met as of 10/22/2014   Time 3   Period Weeks   Status On-going   PT LONG TERM GOAL #3   Title Pt hamstring strength to be improved one grade to allow pt to squat to get objects in lower cabinets and return from squatted position  Not met as of 10/22/2014   Time 3   Period Weeks   Status On-going   PT LONG TERM GOAL #4   Title Pt pain to be no greater than a 1 with all normal daily activities to improve pt quality of life   Not met as of 10/22/2014   Time 3   Period Weeks   Status On-going               Plan - 10/23/15 1648    Clinical Impression Statement Mr. Tremont is a 75 yo male who has been seen for a total of 6 PT visits s/p L a-scope completed by Dr. Lorenz Coaster. The pt was initially progressing towards his stated PT goals with improved L knee ROM and strength, less severe pain, and improved tolerance with gait activities; however, ~ 1 week ago the pt noted that he "twisted" his L knee while  walking and attempting to turn into a room quickly. Since then the pt's pain levels have been severe. The pt presents to PT this visit with continued c/o significant L knee pain, which mainly increases with ambulation and standing.  L knee AROM was measured to 0-131 degrees in supine with moderate pain reported with end-range extension. Minor edema assessed medial and superior to the L knee joint. Tender to palpation assessed over the medial joint line. Special tests were not completed per pt request. Pt has not achieved her current PT goals due to his recent L knee exacerbation. Goals continue to be appropriate for desired outcomes. PT tx was focused on pain management and basic supine knee/hip strengthening and ROM ther ex. Pt responded well to PT tx with L knee pain reduced from a 8/10 on a VAS to a 2/10 on a VAS post manual therapy techniques and use of cryotherapy. The pt continues to present with impairments and limitations including increased L knee pain, L hip/knee weakness, and abnormal gait. The pt would benefit from  continued skilled PT for an additional 2x/week for 5 weeks in order to address current deficits and complaints. The pt is in agreement with additional PT visits, but would like to F/U with Dr. Lorenz Coaster before returning.   Pt will benefit from skilled therapeutic intervention in order to improve on the following deficits Abnormal gait;Decreased range of motion;Difficulty walking;Decreased activity tolerance;Pain;Impaired flexibility;Decreased balance;Decreased mobility;Decreased strength;Increased edema   Rehab Potential Good   Clinical Impairments Affecting Rehab Potential hx of arthritis; recent exacerbation of L knee pain on 10/16/15   PT Frequency 2x / week   PT Duration Other (comment)  5 weeks   PT Treatment/Interventions Gait training;Stair training;Therapeutic activities;Therapeutic exercise;Balance training;Patient/family education;Passive range of motion;Manual  techniques;Cryotherapy   PT Next Visit Plan Continue with current POC if MD is in agreement with focus on reducing pain with modalities and manual therapy techniques, supine L knee/hip strengthening ther ex, and L knee ROM ther ex.    PT Home Exercise Plan Updated HEP with instructions to complete only quad sets, SAQ, heel slides, and SLR with focus on pain and edema management.    Recommended Other Services Recommended pt to F/U with referring MD due to recent L knee exacerbation    Consulted and Agree with Plan of Care Patient          G-Codes - 2015-10-25 1704    Functional Assessment Tool Used FOTO   Functional Limitation Mobility: Walking and moving around   Mobility: Walking and Moving Around Current Status [phone]) At least 60 percent but less than 80 percent impaired, limited or restricted   Mobility: Walking and Moving Around Goal Status [phone]) At least 20 percent but less than 40 percent impaired, limited or restricted      Problem List Patient Active Problem List   Diagnosis Date Noted  . COPD (chronic obstructive pulmonary disease) ([place]) 10/20/2015  . Seborrheic dermatitis of scalp 01/11/2014  . DDD (degenerative disc disease), lumbar 01/11/2014  . Decreased reflex 02/18/2013  . Borderline diabetes 02/18/2013  . Mild hyperlipidemia 02/18/2013  . Hip pain 11/13/2012  . BPH (benign prostatic hyperplasia) 03/09/2012  . Cataract 03/09/2012    Garen Lah, PT, DPT  October 25, 2015, 5:16 PM  [place] [address] [place], [place], [address] Phone: [phone]   Fax:  [phone]  Name: Tao Satz MRN: [id number] Date of Birth: 02-02-1941

## 2015-10-26 ENCOUNTER — Encounter (HOSPITAL_COMMUNITY): Payer: Self-pay | Admitting: Physical Therapy

## 2015-10-27 DIAGNOSIS — M25562 Pain in left knee: Secondary | ICD-10-CM | POA: Diagnosis not present

## 2015-10-30 ENCOUNTER — Encounter (HOSPITAL_COMMUNITY): Payer: Self-pay | Admitting: Physical Therapy

## 2015-11-10 DIAGNOSIS — M2392 Unspecified internal derangement of left knee: Secondary | ICD-10-CM | POA: Diagnosis not present

## 2015-11-24 ENCOUNTER — Ambulatory Visit (HOSPITAL_COMMUNITY): Payer: Medicare Other | Attending: Orthopedic Surgery | Admitting: Physical Therapy

## 2015-11-24 DIAGNOSIS — M25562 Pain in left knee: Secondary | ICD-10-CM | POA: Insufficient documentation

## 2015-11-24 DIAGNOSIS — R29898 Other symptoms and signs involving the musculoskeletal system: Secondary | ICD-10-CM | POA: Diagnosis not present

## 2015-11-24 DIAGNOSIS — R29818 Other symptoms and signs involving the nervous system: Secondary | ICD-10-CM | POA: Insufficient documentation

## 2015-11-24 DIAGNOSIS — M25662 Stiffness of left knee, not elsewhere classified: Secondary | ICD-10-CM | POA: Diagnosis not present

## 2015-11-24 DIAGNOSIS — R269 Unspecified abnormalities of gait and mobility: Secondary | ICD-10-CM

## 2015-11-24 DIAGNOSIS — R2689 Other abnormalities of gait and mobility: Secondary | ICD-10-CM

## 2015-11-24 NOTE — Therapy (Signed)
[address], [place], [address] Phone: [phone]   Fax:  [phone]  Physical Therapy Evaluation  Patient Details  Name: Benjamin [name] MRN: [id number] Date of Birth: 27-Jan-1941 Referring Provider: Dr. Earlie Server   Encounter Date: 11/24/2015      PT End of Session - 11/24/15 1338    Visit Number 1   Number of Visits 24   Date for PT Re-Evaluation 12/24/15   Authorization Type Medicare   PT Start Time 1300   PT Stop Time 1345   PT Time Calculation (min) 45 min   Activity Tolerance Patient tolerated treatment well   Behavior During Therapy [facility] for tasks assessed/performed      Past Medical History  Diagnosis Date  . Enlarged prostate   . Vision problems     Blind x 20 years, regained site 2000, ? optic nerve injury  . Abnormal EKG     History per report of ST elevations x 20 years, no cardiac history-anomale with aorta-sts "it always looks like i am having a heart atttack on my EKG  . Headache(784.0)     last one 5 yrs ago  . Arthritis   . COPD (chronic obstructive pulmonary disease) [facility])     Past Surgical History  Procedure Laterality Date  . Appendectomy      age 26  . Right shoulder    . Left elbow    . Left thumb    . Cataract extraction w/phaco  07/27/2012    Procedure: CATARACT EXTRACTION PHACO AND INTRAOCULAR LENS PLACEMENT (IOC);  Surgeon: [name], MD;  Location: [facility];  Service: Ophthalmology;  Laterality: Right;  CDE: 12.55    There were no vitals filed for this visit.  Visit Diagnosis:  Left leg weakness - Plan: PT plan of care cert/re-cert  Abnormality of gait - Plan: PT plan of care cert/re-cert  Balance problem - Plan: PT plan of care cert/re-cert  Stiffness of knee joint, left - Plan: PT plan of care cert/re-cert      Subjective Assessment - 11/24/15 1333    Subjective Benjamin [name] had a left arthroscopic surgery on 09/20/2015 to repair his medial meniscus. He had 6  physical therapy visits when he twisted his knee on 11/14/2015.   He had significant increased pain  in his knee therefore he went back to his MD and recieved a shot of cortisone.  He recived 3 weeks of pain relief and then his pain started to return.   He is also having weakness in his knee with his knee buckling three to four times a day. He asked his MD if he could return to therapy to improve his strength.    Pertinent History L mensical tear, arthritis, and COPD    Limitations Walking   How long can you sit comfortably? as long as he want to   How long can you stand comfortably? difficult sit to stand after sitting for a while.  He is only able to tolerat 30 minutes of standing due to increased pain.    How long can you walk comfortably? able to walk with a cane for 20 minutes.   Pt takes a cane with him if he knows he is going to have to walk greater than five miniutes.     Patient Stated Goals Pt's goal is to be able to ambulate without an AD, reduce his L knee pain, and return to his PLOF.  Currently in Pain? Yes   Pain Score 6    Pain Location Knee   Pain Orientation Left;Medial;Lateral   Pain Type Chronic pain   Pain Radiating Towards no   Pain Onset More than a month ago   Pain Frequency Constant   Aggravating Factors  standing    Pain Relieving Factors rest    Effect of Pain on Daily Activities limits standing and walking    Multiple Pain Sites Yes            [facility] PT Assessment - 11/24/15 0001    Assessment   Medical Diagnosis Lt medial menisectomy   Referring Provider Dr. Earlie Server    Onset Date/Surgical Date 09/20/15   Hand Dominance Right   Next MD Visit 09/28/2015  not scheduled    Prior Therapy none   Precautions   Precautions None   Restrictions   Weight Bearing Restrictions No   Balance Screen   Has the patient fallen in the past 6 months Yes   How many times? 5   Has the patient had a decrease in activity level because of a fear of falling?  Yes    Is the patient reluctant to leave their home because of a fear of falling?  No   Home Ecologist residence   Home Access Stairs to enter   Entrance Stairs-Number of Steps 6  does not complete reciprocally    Prior Function   Level of Independence Independent   Vocation Retired   Leisure walking the dog 2x a day 1/4/ a mile; computers, Information systems manager   Overall Cognitive Status Within Functional Limits for tasks assessed   Observation/Other Assessments   Focus on Therapeutic Outcomes (FOTO)  39   Functional Tests   Functional tests Single leg stance;Sit to Stand   Single Leg Stance   Comments Rt 30 seconds Lt 42 seconds    Sit to Stand   Comments --  pt states unable to do uses right leg and arms to get up    AROM   Left Knee Extension 0   Left Knee Flexion 112   Strength   Right/Left Hip Left   Left Hip Flexion 2+/5   Left Hip Extension 5/5   Left Hip ABduction 4+/5   Left Knee Flexion 4-/5   Left Knee Extension 3-/5   Left Ankle Dorsiflexion 5/5                   [facility] Adult PT Treatment/Exercise - 11/24/15 0001    Exercises   Exercises Knee/Hip   Knee/Hip Exercises: Standing   Heel Raises Both;10 reps   Knee/Hip Exercises: Seated   Long Arc Quad Strengthening;Left;10 reps   Long Arc Quad Weight 3 lbs.   Knee/Hip Exercises: Supine   Quad Sets Strengthening;Left;10 reps   Short Arc Quad Sets Strengthening;Left;10 reps   Straight Leg Raises 10 reps                PT Education - 11/24/15 1338    Education provided Yes   Education Details HEP   Person(s) Educated Patient   Methods Explanation   Comprehension Verbalized understanding          PT Short Term Goals - 11/24/15 1441    PT SHORT TERM GOAL #1   Title Pt strength to be at least a 4/5 in LE to allow pt to come sit to stand without the use of  UE    Time 4   Period Weeks   Status New   PT SHORT TERM GOAL #2   Title Pt to be able to go up  and down steps in a reciprocal manner   Time 4   Period Weeks   Status New   PT SHORT TERM GOAL #3   Title Pt pain to be no more than a 4/10 to allow pt to walk for 1/2 mile   Time 4   Period Weeks   Status New   PT SHORT TERM GOAL #4   Title Pt to state that his knee buckles no more than one time a day for decreased risk of falling    Time 4   Period Weeks   Status New   PT SHORT TERM GOAL #5   Title Pt ROM to be at least 120 degrees to allow pt to squat to get into lower cabinets in the kitchen/ bathroom.    Time 2   Period Weeks   Status New           PT Long Term Goals - 11/24/15 1436    PT LONG TERM GOAL #1   Title Pt pain to be no greater than a 2/10 to allow pt to walk dogs for a mile    Time 8   Period Weeks   Status New   PT LONG TERM GOAL #2   Title Pt to be ablet to squat and return from squatted postiong to be able to pick items off the floor.    Time 4   Period Weeks   Status New   PT LONG TERM GOAL #3   Title Pt to verbalize no incident of knee giving away in the past two weeks for reduce risk of falls    Time 8   Period Weeks   Status New   PT LONG TERM GOAL #4   Title PT to be able to verbalize that he feels secure walking over uneven terrain for 20 mintues or more to be able to walk his dogs on his land.    Time 4   Period Weeks   Status Achieved               Plan - 11/24/15 1340    Clinical Impression Statement Benjamin [name] had arthroscopic surgery on his left knee on 09/20/2015.   He was doing well until he twisted his knee on 11/14/2015.  Since this time Benjamin [name] has had increased pain and significant weakness in his knee.  He states that his knee is giving way on him approximately three times a day.  He is being referred to skilled physical therapy to improve his strength, decrease his pain and improve his functional ablility.  Evaluation reveals decreased ROM, decreased strength,decreased activity tolerance,  increased pain, increased edema  ; he will benefit from skilled physical therapy to address these issues and maximize his functional abiility.     Pt will benefit from skilled therapeutic intervention in order to improve on the following deficits Abnormal gait;Decreased range of motion;Difficulty walking;Decreased activity tolerance;Pain;Impaired flexibility;Decreased balance;Decreased mobility;Decreased strength;Increased edema   Rehab Potential Good   Clinical Impairments Affecting Rehab Potential hx of arthritis; recent exacerbation of L knee pain on 10/16/15   PT Frequency 3x / week   PT Duration 8 weeks   PT Treatment/Interventions Cryotherapy;Electrical Stimulation;Ultrasound;Moist Heat;Iontophoresis 4mg /ml Dexamethasone;Gait training;Stair training;Therapeutic activities;Therapeutic exercise;Balance training;Patient/family education;Manual techniques   PT Next Visit Plan Begin heel raise, terminal  knee extension standing, rockerboard, standing knee flexion, wall sqat with ball, and functional squats.    PT Home Exercise Plan given           G-Codes - 12/21/15 1444    Functional Assessment Tool Used FOTO   Functional Limitation Mobility: Walking and moving around   Mobility: Walking and Moving Around Current Status [phone]) At least 60 percent but less than 80 percent impaired, limited or restricted   Mobility: Walking and Moving Around Goal Status [phone]) At least 40 percent but less than 60 percent impaired, limited or restricted       Problem List Patient Active Problem List   Diagnosis Date Noted  . COPD (chronic obstructive pulmonary disease) ([place]) 10/20/2015  . Seborrheic dermatitis of scalp 01/11/2014  . DDD (degenerative disc disease), lumbar 01/11/2014  . Decreased reflex 02/18/2013  . Borderline diabetes 02/18/2013  . Mild hyperlipidemia 02/18/2013  . Hip pain 11/13/2012  . BPH (benign prostatic hyperplasia) 03/09/2012  . Cataract 03/09/2012    Rayetta Humphrey, PT CLT [id number] 12-21-15, 2:51  PM  [facility] [address] [place], [place], [address] Phone: [phone]   Fax:  [phone]  Name: Benjamin [name] MRN: [id number] Date of Birth: Jul 10, 1941

## 2015-11-24 NOTE — Patient Instructions (Addendum)
Knee Extension (Sitting)    Place __2.5__ pound weight on left ankle and straighten knee fully, lower slowly. Repeat _10___ times per set. Do __1__ sets per session. Do __2__ sessions per day.  http://orth.exer.us/732   Copyright  VHI. All rights reserved.  Strengthening: Quadriceps Set    Tighten muscles on top of thighs by pushing knees down into surface. Hold __5_ seconds. Repeat _10___ times per set. Do _1___ sets per session. Do _2___ sessions per day.  http://orth.exer.us/602   Copyright  VHI. All rights reserved.  Strengthening: Terminal Knee Extension (Supine)    With left knee over bolster, straighten knee by tightening muscles on top of thigh. Keep bottom of knee on bolster. Repeat _10___ times per set. Do __1__ sets per session. Do __2__ sessions per day.  http://orth.exer.us/626   Copyright  VHI. All rights reserved.  Strengthening: Straight Leg Raise (Phase 1)    Tighten muscles on front of left thigh, then lift leg _15___ inches from surface, keeping knee locked.  Repeat __10__ times per set. Do __1__ sets per session. Do __2__ sessions per day.  http://orth.exer.us/614   Copyright  VHI. All rights reserved.  Heel Raise: Bilateral (Standing)    Rise on balls of feet. Repeat _10___ times per set. Do _1___ sets per session. Do __2__ sessions per day.  http://orth.exer.us/38   Copyright  VHI. All rights reserved.  Self-Mobilization: Heel Slide (Supine)    Slide left heel toward buttocks until a gentle stretch is felt. Hold ____ seconds. Relax. Repeat ____ times per set. Do ____ sets per session. Do ____ sessions per day.  http://orth.exer.us/710   Copyright  VHI. All rights reserved.  Self-Mobilization: Heel Slide (Supine)    Slide left heel toward buttocks until a gentle stretch is felt. Hold __3-5__ seconds. Relax. Repeat _10___ times per set. Do __1__ sets per session. Do _2___ sessions per day.  http://orth.exer.us/710    Copyright  VHI. All rights reserved.

## 2015-11-28 ENCOUNTER — Ambulatory Visit (HOSPITAL_COMMUNITY): Payer: Medicare Other | Admitting: Physical Therapy

## 2015-11-28 DIAGNOSIS — M25662 Stiffness of left knee, not elsewhere classified: Secondary | ICD-10-CM

## 2015-11-28 DIAGNOSIS — R29898 Other symptoms and signs involving the musculoskeletal system: Secondary | ICD-10-CM | POA: Diagnosis not present

## 2015-11-28 DIAGNOSIS — M25562 Pain in left knee: Secondary | ICD-10-CM | POA: Diagnosis not present

## 2015-11-28 DIAGNOSIS — R269 Unspecified abnormalities of gait and mobility: Secondary | ICD-10-CM | POA: Diagnosis not present

## 2015-11-28 DIAGNOSIS — R29818 Other symptoms and signs involving the nervous system: Secondary | ICD-10-CM | POA: Diagnosis not present

## 2015-11-28 DIAGNOSIS — R2689 Other abnormalities of gait and mobility: Secondary | ICD-10-CM

## 2015-11-28 NOTE — Therapy (Signed)
[facility] [address] [place], [place], [address] Phone: [phone]   Fax:  [phone]  Physical Therapy Treatment  Patient Details  Name: Benjamin [name] MRN: [id number] Date of Birth: 04/03/1941 Referring Provider: Dr. Earlie Server   Encounter Date: 11/28/2015      PT End of Session - 11/28/15 1525    Visit Number 2   Number of Visits 24   Date for PT Re-Evaluation 12/24/15   Authorization Type Medicare   Authorization Time Period G code needs updating by 10th visit   PT Start Time 1435   PT Stop Time 1516   PT Time Calculation (min) 41 min   Activity Tolerance Patient tolerated treatment well   Behavior During Therapy [facility] for tasks assessed/performed      Past Medical History  Diagnosis Date  . Enlarged prostate   . Vision problems     Blind x 20 years, regained site 2000, ? optic nerve injury  . Abnormal EKG     History per report of ST elevations x 20 years, no cardiac history-anomale with aorta-sts "it always looks like i am having a heart atttack on my EKG  . Headache(784.0)     last one 5 yrs ago  . Arthritis   . COPD (chronic obstructive pulmonary disease) [facility])     Past Surgical History  Procedure Laterality Date  . Appendectomy      age 63  . Right shoulder    . Left elbow    . Left thumb    . Cataract extraction w/phaco  07/27/2012    Procedure: CATARACT EXTRACTION PHACO AND INTRAOCULAR LENS PLACEMENT (IOC);  Surgeon: [name], MD;  Location: [facility];  Service: Ophthalmology;  Laterality: Right;  CDE: 12.55    There were no vitals filed for this visit.  Visit Diagnosis:  Left leg weakness  Balance problem  Stiffness of knee joint, left  Knee pain, acute, left  Abnormality of gait      Subjective Assessment - 11/28/15 1438    Subjective Pt is doing good today. Rates .5/10 pain deep in his L knee. He has not been performing his HEP at home because he has not remembered to do them. He reports he  is somewhat fearful of bearing equal weight on his LE when performing sit to stand.    Pertinent History L mensical tear, arthritis, and COPD    Limitations Walking   How long can you sit comfortably? as long as he wants to   How long can you stand comfortably? difficult sit to stand after sitting for a while.  He is only able to tolerat 30 minutes of standing due to increased pain.    How long can you walk comfortably? able to walk with a cane for 20 minutes.   Pt takes a cane with him if he knows he is going to have to walk greater than five miniutes.     Patient Stated Goals Pt's goal is to be able to ambulate without an AD, reduce his L knee pain, and return to his PLOF.    Currently in Pain? Yes   Pain Score --  .5/10   Pain Location Knee   Pain Orientation Left;Medial;Lateral   Pain Descriptors / Indicators Stabbing   Pain Type Chronic pain   Pain Radiating Towards no   Pain Onset More than a month ago   Pain Frequency Constant   Aggravating Factors  standing  Pain Relieving Factors rest   Effect of Pain on Daily Activities limits standing and waking    Multiple Pain Sites No                         [facility] Adult PT Treatment/Exercise - 11/28/15 0001    Knee/Hip Exercises: Aerobic   Stationary Bike x3 min, L3   Knee/Hip Exercises: Seated   Sit to Sand 2 sets;without UE support;15 reps  2" box underneath RLE    Knee/Hip Exercises: Supine   Bridges 2 sets;10 reps   Manual Therapy   Manual Therapy Soft tissue mobilization   Manual therapy comments performed separately from all other interventions   Soft tissue mobilization L distal quad: palmar spreading and trigger point release                PT Education - 11/28/15 1524    Education provided Yes   Education Details reviewed HEP; discussed the importance of proper technique with sit to stand activity. Reviewed implications for manual treatment.   Person(s) Educated Patient   Methods  Explanation;Demonstration   Comprehension Verbalized understanding;Returned demonstration          PT Short Term Goals - 11/24/15 1441    PT SHORT TERM GOAL #1   Title Pt strength to be at least a 4/5 in LE to allow pt to come sit to stand without the use of UE    Time 4   Period Weeks   Status New   PT SHORT TERM GOAL #2   Title Pt to be able to go up and down steps in a reciprocal manner   Time 4   Period Weeks   Status New   PT SHORT TERM GOAL #3   Title Pt pain to be no more than a 4/10 to allow pt to walk for 1/2 mile   Time 4   Period Weeks   Status New   PT SHORT TERM GOAL #4   Title Pt to state that his knee buckles no more than one time a day for decreased risk of falling    Time 4   Period Weeks   Status New   PT SHORT TERM GOAL #5   Title Pt ROM to be at least 120 degrees to allow pt to squat to get into lower cabinets in the kitchen/ bathroom.    Time 2   Period Weeks   Status New           PT Long Term Goals - 11/24/15 1436    PT LONG TERM GOAL #1   Title Pt pain to be no greater than a 2/10 to allow pt to walk dogs for a mile    Time 8   Period Weeks   Status New   PT LONG TERM GOAL #2   Title Pt to be ablet to squat and return from squatted postiong to be able to pick items off the floor.    Time 4   Period Weeks   Status New   PT LONG TERM GOAL #3   Title Pt to verbalize no incident of knee giving away in the past two weeks for reduce risk of falls    Time 8   Period Weeks   Status New   PT LONG TERM GOAL #4   Title PT to be able to verbalize that he feels secure walking over uneven terrain for 20 mintues or more to be able  to walk his dogs on his land.    Time 4   Period Weeks   Status Achieved               Plan - 11/28/15 1528    Clinical Impression Statement Pt continues to report L knee pain and buckling during daily activity secondary to L quad weakness and soft tissue restrictions. There is also some evidence of fear  avoidance behavior and quad weakness during sit to stand activity with increased weight shift Rt to avoid knee buckling and falls. He responded well to manual treatment this session with reported 0/10 pain and improved quad activation during sit to stand by the end of today's session. Will continue POC with progression of manual treatment and LE strengthening as appropriate.    Pt will benefit from skilled therapeutic intervention in order to improve on the following deficits Abnormal gait;Decreased range of motion;Difficulty walking;Decreased activity tolerance;Pain;Impaired flexibility;Decreased balance;Decreased mobility;Decreased strength;Increased edema   Rehab Potential Good   Clinical Impairments Affecting Rehab Potential hx of arthritis; recent exacerbation of L knee pain on 10/16/15   PT Frequency 3x / week   PT Duration 8 weeks   PT Treatment/Interventions Cryotherapy;Electrical Stimulation;Ultrasound;Moist Heat;Iontophoresis 4mg /ml Dexamethasone;Gait training;Stair training;Therapeutic activities;Therapeutic exercise;Balance training;Patient/family education;Manual techniques   PT Next Visit Plan Continue addressing sit to stand technique, L quad strengthening,    PT Home Exercise Plan reviewed with addition of sit to stand technique correction   Consulted and Agree with Plan of Care Patient        Problem List Patient Active Problem List   Diagnosis Date Noted  . COPD (chronic obstructive pulmonary disease) ([place]) 10/20/2015  . Seborrheic dermatitis of scalp 01/11/2014  . DDD (degenerative disc disease), lumbar 01/11/2014  . Decreased reflex 02/18/2013  . Borderline diabetes 02/18/2013  . Mild hyperlipidemia 02/18/2013  . Hip pain 11/13/2012  . BPH (benign prostatic hyperplasia) 03/09/2012  . Cataract 03/09/2012    3:39 PM,11/28/2015 Elly Modena PT, DPT [facility] [address] [place], [place], [address] Phone: [phone]   Fax:  [phone]  Name: Nader Ippolito MRN: [id number] Date of Birth: 1941-07-12

## 2015-12-01 ENCOUNTER — Ambulatory Visit (HOSPITAL_COMMUNITY): Payer: Medicare Other | Admitting: Physical Therapy

## 2015-12-01 DIAGNOSIS — M25662 Stiffness of left knee, not elsewhere classified: Secondary | ICD-10-CM | POA: Diagnosis not present

## 2015-12-01 DIAGNOSIS — R29898 Other symptoms and signs involving the musculoskeletal system: Secondary | ICD-10-CM

## 2015-12-01 DIAGNOSIS — M25562 Pain in left knee: Secondary | ICD-10-CM

## 2015-12-01 DIAGNOSIS — R2689 Other abnormalities of gait and mobility: Secondary | ICD-10-CM

## 2015-12-01 DIAGNOSIS — R269 Unspecified abnormalities of gait and mobility: Secondary | ICD-10-CM

## 2015-12-01 DIAGNOSIS — R29818 Other symptoms and signs involving the nervous system: Secondary | ICD-10-CM | POA: Diagnosis not present

## 2015-12-01 NOTE — Therapy (Signed)
[place], [place], [address] Phone: [phone]   Fax:  [phone]  Physical Therapy Treatment  Patient Details  Name: Benjamin [name] MRN: [id number] Date of Birth: Feb 14, 1941 Referring Provider: Dr. Earlie Server   Encounter Date: 12/01/2015      PT End of Session - 12/01/15 1619    Visit Number 3   Number of Visits 24   Date for PT Re-Evaluation 12/24/15   Authorization Type Medicare   Authorization Time Period G code needs updating by 10th visit   Authorization - Visit Number 3   PT Start Time 1430   PT Stop Time 1513   PT Time Calculation (min) 43 min   Activity Tolerance Patient tolerated treatment well   Behavior During Therapy [facility] for tasks assessed/performed      Past Medical History  Diagnosis Date  . Enlarged prostate   . Vision problems     Blind x 20 years, regained site 2000, ? optic nerve injury  . Abnormal EKG     History per report of ST elevations x 20 years, no cardiac history-anomale with aorta-sts "it always looks like i am having a heart atttack on my EKG  . Headache(784.0)     last one 5 yrs ago  . Arthritis   . COPD (chronic obstructive pulmonary disease) [facility])     Past Surgical History  Procedure Laterality Date  . Appendectomy      age 66  . Right shoulder    . Left elbow    . Left thumb    . Cataract extraction w/phaco  07/27/2012    Procedure: CATARACT EXTRACTION PHACO AND INTRAOCULAR LENS PLACEMENT (IOC);  Surgeon: [name], MD;  Location: [facility];  Service: Ophthalmology;  Laterality: Right;  CDE: 12.55    There were no vitals filed for this visit.  Visit Diagnosis:  Left leg weakness  Balance problem  Stiffness of knee joint, left  Knee pain, acute, left  Abnormality of gait      Subjective Assessment - 12/01/15 1434    Subjective Doing okay, can't really walk. Lt knee has been hurting buckling on me. Denies having any falls.    Currently in Pain? Yes    Pain Score --  .5   Pain Location Knee   Pain Orientation Left   Pain Descriptors / Indicators --  it's just there   Pain Type Chronic pain   Pain Frequency Constant   Aggravating Factors  everything, walking, steps   Pain Relieving Factors sitting, changing positions                         [facility] Adult PT Treatment/Exercise - 12/01/15 0001    Knee/Hip Exercises: Aerobic   Stationary Bike x3 min, L3   Knee/Hip Exercises: Seated   Long Arc Quad Strengthening;Left;2 sets;Weights   Long Arc Quad Weight 5 lbs.   Abd/Adduction Limitations isometric abduction 2X10   Sit to Sand 1 set;10 reps;Other (comment)  encouraging even weight bearing   Knee/Hip Exercises: Supine   Quad Sets Strengthening;Left;10 reps   Short Arc Quad Sets Strengthening;Left;10 reps;2 sets   Straight Leg Raises Strengthening;Left;2 sets;10 reps   Other Supine Knee/Hip Exercises heel digs 2X10   Other Supine Knee/Hip Exercises hip add/abd 2X10   Manual Therapy   Manual Therapy Soft tissue mobilization   Manual therapy comments performed separately from all other interventions   Soft tissue mobilization  distal quadriceps musculature                PT Education - 12/01/15 1618    Education provided Yes   Education Details review of need to continue with HEP consistency.    Person(s) Educated Patient   Methods Explanation   Comprehension Verbalized understanding          PT Short Term Goals - 11/24/15 1441    PT SHORT TERM GOAL #1   Title Pt strength to be at least a 4/5 in LE to allow pt to come sit to stand without the use of UE    Time 4   Period Weeks   Status New   PT SHORT TERM GOAL #2   Title Pt to be able to go up and down steps in a reciprocal manner   Time 4   Period Weeks   Status New   PT SHORT TERM GOAL #3   Title Pt pain to be no more than a 4/10 to allow pt to walk for 1/2 mile   Time 4   Period Weeks   Status New   PT SHORT TERM GOAL #4   Title Pt to  state that his knee buckles no more than one time a day for decreased risk of falling    Time 4   Period Weeks   Status New   PT SHORT TERM GOAL #5   Title Pt ROM to be at least 120 degrees to allow pt to squat to get into lower cabinets in the kitchen/ bathroom.    Time 2   Period Weeks   Status New           PT Long Term Goals - 11/24/15 1436    PT LONG TERM GOAL #1   Title Pt pain to be no greater than a 2/10 to allow pt to walk dogs for a mile    Time 8   Period Weeks   Status New   PT LONG TERM GOAL #2   Title Pt to be ablet to squat and return from squatted postiong to be able to pick items off the floor.    Time 4   Period Weeks   Status New   PT LONG TERM GOAL #3   Title Pt to verbalize no incident of knee giving away in the past two weeks for reduce risk of falls    Time 8   Period Weeks   Status New   PT LONG TERM GOAL #4   Title PT to be able to verbalize that he feels secure walking over uneven terrain for 20 mintues or more to be able to walk his dogs on his land.    Time 4   Period Weeks   Status Achieved               Plan - 12/01/15 1620    Clinical Impression Statement Patient reporting that he is still having trouble with his Lt knee giving out at times. He states that he has tried to be more consistent with his HEP. During the session the patient did demonstrate significnat weakness through the LLE and rapid muscular fatigue. Following the session the patient denied any incresed pain. There were 2 observed incidents of the patient's Lt knee buckling  but the patient was able to catch himself. Discussed being cautious with his mobility immediatly following his exercises or with fatigue to limit fall risks.    PT Next Visit Plan  Focus on LLE strengthening in best tolerated position.    PT Home Exercise Plan continue to encourage consistency with HEP.   Consulted and Agree with Plan of Care Patient        Problem List Patient Active Problem  List   Diagnosis Date Noted  . COPD (chronic obstructive pulmonary disease) ([place]) 10/20/2015  . Seborrheic dermatitis of scalp 01/11/2014  . DDD (degenerative disc disease), lumbar 01/11/2014  . Decreased reflex 02/18/2013  . Borderline diabetes 02/18/2013  . Mild hyperlipidemia 02/18/2013  . Hip pain 11/13/2012  . BPH (benign prostatic hyperplasia) 03/09/2012  . Cataract 03/09/2012    Cassell Clement, PT, CSCS Pager [phone]  12/01/2015, 4:26 PM  [place], [place], [address] Phone: [phone]   Fax:  [phone]  Name: Benjamin [name] MRN: [id number] Date of Birth: 09/11/1941

## 2015-12-06 ENCOUNTER — Ambulatory Visit (INDEPENDENT_AMBULATORY_CARE_PROVIDER_SITE_OTHER): Payer: Medicare Other | Admitting: Family Medicine

## 2015-12-06 ENCOUNTER — Encounter: Payer: Self-pay | Admitting: Family Medicine

## 2015-12-06 ENCOUNTER — Ambulatory Visit (HOSPITAL_COMMUNITY): Payer: Medicare Other

## 2015-12-06 VITALS — BP 130/68 | HR 70 | Temp 98.2°F | Resp 14 | Ht 70.0 in | Wt 177.0 lb

## 2015-12-06 DIAGNOSIS — M25562 Pain in left knee: Secondary | ICD-10-CM

## 2015-12-06 DIAGNOSIS — M25662 Stiffness of left knee, not elsewhere classified: Secondary | ICD-10-CM

## 2015-12-06 DIAGNOSIS — R2689 Other abnormalities of gait and mobility: Secondary | ICD-10-CM

## 2015-12-06 DIAGNOSIS — R29818 Other symptoms and signs involving the nervous system: Secondary | ICD-10-CM | POA: Diagnosis not present

## 2015-12-06 DIAGNOSIS — R1084 Generalized abdominal pain: Secondary | ICD-10-CM

## 2015-12-06 DIAGNOSIS — R269 Unspecified abnormalities of gait and mobility: Secondary | ICD-10-CM

## 2015-12-06 DIAGNOSIS — R110 Nausea: Secondary | ICD-10-CM

## 2015-12-06 DIAGNOSIS — R29898 Other symptoms and signs involving the musculoskeletal system: Secondary | ICD-10-CM

## 2015-12-06 LAB — CBC WITH DIFFERENTIAL/PLATELET
BASOS PCT: 1 % (ref 0–1)
Basophils Absolute: 0.1 10*3/uL (ref 0.0–0.1)
Eosinophils Absolute: 0.4 10*3/uL (ref 0.0–0.7)
Eosinophils Relative: 6 % — ABNORMAL HIGH (ref 0–5)
HEMATOCRIT: 45.8 % (ref 39.0–52.0)
HEMOGLOBIN: 15.5 g/dL (ref 13.0–17.0)
LYMPHS PCT: 36 % (ref 12–46)
Lymphs Abs: 2.3 10*3/uL (ref 0.7–4.0)
MCH: 31.9 pg (ref 26.0–34.0)
MCHC: 33.8 g/dL (ref 30.0–36.0)
MCV: 94.2 fL (ref 78.0–100.0)
MPV: 9.1 fL (ref 8.6–12.4)
Monocytes Absolute: 0.5 10*3/uL (ref 0.1–1.0)
Monocytes Relative: 8 % (ref 3–12)
NEUTROS ABS: 3.1 10*3/uL (ref 1.7–7.7)
NEUTROS PCT: 49 % (ref 43–77)
Platelets: 271 10*3/uL (ref 150–400)
RBC: 4.86 MIL/uL (ref 4.22–5.81)
RDW: 14.4 % (ref 11.5–15.5)
WBC: 6.3 10*3/uL (ref 4.0–10.5)

## 2015-12-06 LAB — COMPREHENSIVE METABOLIC PANEL
ALK PHOS: 43 U/L (ref 40–115)
ALT: 18 U/L (ref 9–46)
AST: 16 U/L (ref 10–35)
Albumin: 4 g/dL (ref 3.6–5.1)
BUN: 18 mg/dL (ref 7–25)
CALCIUM: 9.1 mg/dL (ref 8.6–10.3)
CHLORIDE: 108 mmol/L (ref 98–110)
CO2: 25 mmol/L (ref 20–31)
Creat: 0.96 mg/dL (ref 0.70–1.18)
GLUCOSE: 90 mg/dL (ref 70–99)
POTASSIUM: 4.2 mmol/L (ref 3.5–5.3)
Sodium: 145 mmol/L (ref 135–146)
TOTAL PROTEIN: 6.2 g/dL (ref 6.1–8.1)
Total Bilirubin: 0.9 mg/dL (ref 0.2–1.2)

## 2015-12-06 LAB — LIPASE: LIPASE: 19 U/L (ref 7–60)

## 2015-12-06 MED ORDER — ONDANSETRON 4 MG PO TBDP: 4.0000 mg | ORAL_TABLET | Freq: Three times a day (TID) | ORAL | Status: DC | PRN

## 2015-12-06 MED ORDER — DEXLANSOPRAZOLE 60 MG PO CPDR: 60.0000 mg | DELAYED_RELEASE_CAPSULE | Freq: Every day | ORAL | Status: DC

## 2015-12-06 NOTE — Progress Notes (Signed)
Patient ID: Benjamin [name], male   DOB: December 13, 1940, 75 y.o.   MRN: [id number]    Subjective:    Patient ID: Benjamin [name], male    DOB: 09/08/1941, 75 y.o.   MRN: [id number]  Patient presents for Abdominal Pain Patient here with generalized abdominal pain associated with nausea pain with eating and some change in his bowels for the past month. He states no matter what he eats he gets discomfort when he eats and afterwards he still has a gnawing sensation. He states that he feels nauseous a lot and has also had some acid reflux. His bowels are on the softer side but he is not having diarrhea. He is having however multiple bowel movements 2 or 3 a day when he typically only have one a day or every other day. He has not taken anything over-the-counter. He has been taking tramadol Tylenol occasional Naprosyn. In the past he was on Naprosyn because of his knee. He feels very gassy and has taken a few doses of simethicone with mild improvement. He also tried to change his diet and lactose-free which he states did help with some of the reflux symptoms. He denies any blood in stool. He's never had a colonoscopy as he is declined this. He states at times even when he moves around he gets discomfort in his abdomen.  He is stop the Symbicort he did not see a big difference with this  Review Of Systems:  GEN- denies fatigue, fever, weight loss,weakness, recent illness HEENT- denies eye drainage, change in vision, nasal discharge, CVS- denies chest pain, palpitations RESP- denies SOB, cough, wheeze ABD+s N/ denies V, +change in stools,+ abd pain GU- denies dysuria, hematuria, dribbling, incontinence MSK- denies joint pain, muscle aches, injury Neuro- denies headache, dizziness, syncope, seizure activity       Objective:    BP 130/68 mmHg  Pulse 70  Temp(Src) 98.2 F (36.8 C) (Oral)  Resp 14  Ht 5\' 10"  (1.778 m)  Wt 177 lb (80.287 kg)  BMI 25.40 kg/m2 GEN- NAD, alert and oriented x3, non toxic appearing   HEENT- PERRL, EOMI, non injected sclera, pink conjunctiva, MMM, oropharynx clear Neck- Supple, no  LAD  CVS- RRR, no murmur RESP-CTAB ABD-NABS,soft,mild TTP Upper qaudrants, no rebound, no guarding, mild bloated appearance EXT- No edema Pulses- Radial, 2+        Assessment & Plan:      Problem List Items Addressed This Visit    None    Visit Diagnoses    Generalized abdominal pain    -  Primary    DD- Gastritis, PUD, Gallbladder etiology. H pylori done in office, stop all NSAIDS, Start dexilant once a day. Labs pending including lipase doubt pancreatitis. If his H. pylori and his labs are normal then I will proceed with right upper quadrant ultrasound     Relevant Orders    H. pylori breath test    Comprehensive metabolic panel    CBC with Differential/Platelet    Lipase    Nausea without vomiting        Relevant Orders    H. pylori breath test       Note: This dictation was prepared with Dragon dictation along with smaller phrase technology. Any transcriptional errors that result from this process are unintentional.

## 2015-12-06 NOTE — Therapy (Signed)
[place], [place], [address] Phone: [phone]   Fax:  [phone]  Physical Therapy Treatment  Patient Details  Name: Benjamin [name] MRN: [id number] Date of Birth: 31-May-1941 Referring Provider: Dr. Earlie Server   Encounter Date: 12/06/2015      PT End of Session - 12/06/15 1516    Visit Number 4   Number of Visits 24   Date for PT Re-Evaluation 12/24/15   Authorization Type Medicare   Authorization - Visit Number 4   Authorization - Number of Visits 10   PT Start Time [id number]   PT Stop Time 1515   PT Time Calculation (min) 43 min   Equipment Utilized During Treatment Gait belt   Activity Tolerance Patient tolerated treatment well   Behavior During Therapy [facility] for tasks assessed/performed      Past Medical History  Diagnosis Date  . Enlarged prostate   . Vision problems     Blind x 20 years, regained site 2000, ? optic nerve injury  . Abnormal EKG     History per report of ST elevations x 20 years, no cardiac history-anomale with aorta-sts "it always looks like i am having a heart atttack on my EKG  . Headache(784.0)     last one 5 yrs ago  . Arthritis   . COPD (chronic obstructive pulmonary disease) [facility])     Past Surgical History  Procedure Laterality Date  . Appendectomy      age 75  . Right shoulder    . Left elbow    . Left thumb    . Cataract extraction w/phaco  07/27/2012    Procedure: CATARACT EXTRACTION PHACO AND INTRAOCULAR LENS PLACEMENT (IOC);  Surgeon: [name], MD;  Location: [facility];  Service: Ophthalmology;  Laterality: Right;  CDE: 12.55    There were no vitals filed for this visit.  Visit Diagnosis:  Left leg weakness  Balance problem  Stiffness of knee joint, left  Abnormality of gait  Knee pain, acute, left      Subjective Assessment - 12/06/15 1435    Subjective Pt reports multiple falls since surgery, 2 reports of fall today; fell going down step and tripped  over dog walking.   Currently in Pain? Yes   Pain Score 3    Pain Location Knee   Pain Orientation Left             [facility] Adult PT Treatment/Exercise - 12/06/15 0001    Knee/Hip Exercises: Stretches   Active Hamstring Stretch 30 seconds;2 reps   Active Hamstring Stretch Limitations 12in step   Gastroc Stretch 3 reps;30 seconds   Gastroc Stretch Limitations slant board   Knee/Hip Exercises: Standing   Heel Raises Both;15 reps   Heel Raises Limitations Toe raises   Knee Flexion Left;10 reps   Knee Flexion Limitations limited by anterior knee pain with knee flexion   Functional Squat 10 reps   Wall Squat 10 reps  corner with ball   Rocker Board 2 minutes   Rocker Board Limitations R/L   SLS 3x 30" with 1 finger A   Other Standing Knee Exercises 2x 30" tandem stance no HHA   Knee/Hip Exercises: Seated   Sit to Sand 1 set;10 reps;without UE support;5 reps  10 reps with airex; 5 reps without airex no HHA               PT Short Term Goals - 11/24/15 1441  PT SHORT TERM GOAL #1   Title Pt strength to be at least a 4/5 in LE to allow pt to come sit to stand without the use of UE    Time 4   Period Weeks   Status New   PT SHORT TERM GOAL #2   Title Pt to be able to go up and down steps in a reciprocal manner   Time 4   Period Weeks   Status New   PT SHORT TERM GOAL #3   Title Pt pain to be no more than a 4/10 to allow pt to walk for 1/2 mile   Time 4   Period Weeks   Status New   PT SHORT TERM GOAL #4   Title Pt to state that his knee buckles no more than one time a day for decreased risk of falling    Time 4   Period Weeks   Status New   PT SHORT TERM GOAL #5   Title Pt ROM to be at least 120 degrees to allow pt to squat to get into lower cabinets in the kitchen/ bathroom.    Time 2   Period Weeks   Status New           PT Long Term Goals - 11/24/15 1436    PT LONG TERM GOAL #1   Title Pt pain to be no greater than a 2/10 to allow pt to walk dogs  for a mile    Time 8   Period Weeks   Status New   PT LONG TERM GOAL #2   Title Pt to be ablet to squat and return from squatted postiong to be able to pick items off the floor.    Time 4   Period Weeks   Status New   PT LONG TERM GOAL #3   Title Pt to verbalize no incident of knee giving away in the past two weeks for reduce risk of falls    Time 8   Period Weeks   Status New   PT LONG TERM GOAL #4   Title PT to be able to verbalize that he feels secure walking over uneven terrain for 20 mintues or more to be able to walk his dogs on his land.    Time 4   Period Weeks   Status Achieved               Plan - 12/06/15 1832    Clinical Impression Statement Session focus on LE strengthening per PT POC.  Progress to standing exercises to improve functional strengthening with proximal musculature and added baalnce activities following reports of 2 recent falls.  Pt reports fear of fall to complete balance activities with reports of uncountable recent falls since surgery, utilized gait belt for safety and comfort.  Pt encouraged to increase frequency with HEP for strengthneing and to call MD if continues to have multiple falls.  Pt instructed safe mechanics with sit to stand to increase ease and reduce risk of fall.   No reports of increased pain at end of session.   Pt will benefit from skilled therapeutic intervention in order to improve on the following deficits Abnormal gait;Decreased range of motion;Difficulty walking;Decreased activity tolerance;Pain;Impaired flexibility;Decreased balance;Decreased mobility;Decreased strength;Increased edema   Clinical Impairments Affecting Rehab Potential hx of arthritis; recent exacerbation of L knee pain on 10/16/15   PT Frequency 3x / week   PT Duration 8 weeks   PT Treatment/Interventions Cryotherapy;Electrical Stimulation;Ultrasound;Moist Heat;Iontophoresis 4mg /ml Dexamethasone;Gait  training;Stair training;Therapeutic activities;Therapeutic  exercise;Balance training;Patient/family education;Manual techniques   PT Next Visit Plan Continue weight bearing LE strengthenig exercises for functional strengthneing and include balance activities as able.     PT Home Exercise Plan continue to encourage consistency with HEP.        Problem List Patient Active Problem List   Diagnosis Date Noted  . COPD (chronic obstructive pulmonary disease) ([place]) 10/20/2015  . Seborrheic dermatitis of scalp 01/11/2014  . DDD (degenerative disc disease), lumbar 01/11/2014  . Decreased reflex 02/18/2013  . Borderline diabetes 02/18/2013  . Mild hyperlipidemia 02/18/2013  . Hip pain 11/13/2012  . BPH (benign prostatic hyperplasia) 03/09/2012  . Cataract 03/09/2012   [place], [place]; [place]  Aldona Lento 12/06/2015, [place] [address] [place], [place], [address] Phone: [phone]   Fax:  [phone]  Name: Benjamin [name] MRN: [id number] Date of Birth: 14-Jan-1941

## 2015-12-06 NOTE — Patient Instructions (Addendum)
NO anti-inflammatories Take the Dexilant once a day  We will call with results  Zofran for nausea F/U pending results

## 2015-12-07 LAB — H. PYLORI BREATH TEST: H. pylori Breath Test: NOT DETECTED

## 2015-12-08 ENCOUNTER — Other Ambulatory Visit: Payer: Self-pay | Admitting: *Deleted

## 2015-12-08 DIAGNOSIS — R1084 Generalized abdominal pain: Secondary | ICD-10-CM

## 2015-12-08 DIAGNOSIS — R110 Nausea: Secondary | ICD-10-CM

## 2015-12-11 ENCOUNTER — Ambulatory Visit (HOSPITAL_COMMUNITY)
Admission: RE | Admit: 2015-12-11 | Discharge: 2015-12-11 | Disposition: A | Discharge status: Home / Self Care | Payer: Medicare Other | Source: Ambulatory Visit | Attending: Family Medicine | Admitting: Family Medicine

## 2015-12-11 DIAGNOSIS — R1084 Generalized abdominal pain: Secondary | ICD-10-CM

## 2015-12-11 DIAGNOSIS — R932 Abnormal findings on diagnostic imaging of liver and biliary tract: Secondary | ICD-10-CM | POA: Diagnosis not present

## 2015-12-11 DIAGNOSIS — R109 Unspecified abdominal pain: Secondary | ICD-10-CM | POA: Diagnosis not present

## 2015-12-11 DIAGNOSIS — R110 Nausea: Secondary | ICD-10-CM | POA: Insufficient documentation

## 2015-12-12 ENCOUNTER — Other Ambulatory Visit: Payer: Self-pay | Admitting: *Deleted

## 2015-12-12 DIAGNOSIS — R110 Nausea: Secondary | ICD-10-CM

## 2015-12-12 DIAGNOSIS — R1011 Right upper quadrant pain: Secondary | ICD-10-CM

## 2015-12-12 MED ORDER — DEXLANSOPRAZOLE 60 MG PO CPDR: 60.0000 mg | DELAYED_RELEASE_CAPSULE | Freq: Every day | ORAL | Status: DC

## 2015-12-13 ENCOUNTER — Ambulatory Visit (HOSPITAL_COMMUNITY): Payer: Medicare Other | Admitting: Physical Therapy

## 2015-12-13 DIAGNOSIS — M25562 Pain in left knee: Secondary | ICD-10-CM

## 2015-12-13 DIAGNOSIS — R269 Unspecified abnormalities of gait and mobility: Secondary | ICD-10-CM

## 2015-12-13 DIAGNOSIS — R2689 Other abnormalities of gait and mobility: Secondary | ICD-10-CM

## 2015-12-13 DIAGNOSIS — R29898 Other symptoms and signs involving the musculoskeletal system: Secondary | ICD-10-CM | POA: Diagnosis not present

## 2015-12-13 DIAGNOSIS — R29818 Other symptoms and signs involving the nervous system: Secondary | ICD-10-CM | POA: Diagnosis not present

## 2015-12-13 DIAGNOSIS — M25662 Stiffness of left knee, not elsewhere classified: Secondary | ICD-10-CM | POA: Diagnosis not present

## 2015-12-13 NOTE — Therapy (Addendum)
[place], [place], [address] Phone: [phone]   Fax:  [phone]  Physical Therapy Treatment  Patient Details  Name: Benjamin [name] MRN: [id number] Date of Birth: 01/31/1941 Referring Provider: Dr. Earlie Server   Encounter Date: 12/13/2015      PT End of Session - 12/13/15 1804    Visit Number 5   Number of Visits 24   Date for PT Re-Evaluation 12/24/15   Authorization Type Medicare   Authorization - Visit Number 5   Authorization - Number of Visits 10   PT Start Time 1350   PT Stop Time 1430   PT Time Calculation (min) 40 min   Equipment Utilized During Treatment Gait belt   Activity Tolerance Patient tolerated treatment well   Behavior During Therapy [facility] for tasks assessed/performed      Past Medical History  Diagnosis Date  . Enlarged prostate   . Vision problems     Blind x 20 years, regained site 2000, ? optic nerve injury  . Abnormal EKG     History per report of ST elevations x 20 years, no cardiac history-anomale with aorta-sts "it always looks like i am having a heart atttack on my EKG  . Headache(784.0)     last one 5 yrs ago  . Arthritis   . COPD (chronic obstructive pulmonary disease) [facility])     Past Surgical History  Procedure Laterality Date  . Appendectomy      age 75  . Right shoulder    . Left elbow    . Left thumb    . Cataract extraction w/phaco  07/27/2012    Procedure: CATARACT EXTRACTION PHACO AND INTRAOCULAR LENS PLACEMENT (IOC);  Surgeon: [name], MD;  Location: [facility];  Service: Ophthalmology;  Laterality: Right;  CDE: 12.55    There were no vitals filed for this visit.  Visit Diagnosis:  Left leg weakness  Balance problem  Stiffness of knee joint, left  Abnormality of gait  Knee pain, acute, left      Subjective Assessment - 12/13/15 1814    Subjective Pt states his knee pain has gotten worse.  sTates all his pain is medially and increased to 8/10 with  sharp shooting pains.  Pt also reports his Lt knee is "giving way" and he has fallen twice yesterday and once today.   Currently in Pain? Yes   Pain Score 8    Pain Location Knee   Pain Orientation Left   Pain Descriptors / Indicators Stabbing;Constant                         [facility] Adult PT Treatment/Exercise - 12/13/15 0001    Knee/Hip Exercises: Stretches   Active Hamstring Stretch 30 seconds;2 reps   Active Hamstring Stretch Limitations 12in step   Gastroc Stretch 3 reps;30 seconds   Gastroc Stretch Limitations slant board   Knee/Hip Exercises: Standing   Heel Raises Both;15 reps   Heel Raises Limitations Toe raises 15 reps   Knee/Hip Exercises: Seated   Long Arc Quad Strengthening;Left;2 sets;Weights;15 reps   Long Arc Quad Weight 5 lbs.   Knee/Hip Exercises: Supine   Quad Sets Strengthening;Left;10 reps   Short Arc Quad Sets Strengthening;Left;10 reps;2 sets   Straight Leg Raises Strengthening;Left;2 sets;10 reps                  PT Short Term Goals - 11/24/15 1441  PT SHORT TERM GOAL #1   Title Pt strength to be at least a 4/5 in LE to allow pt to come sit to stand without the use of UE    Time 4   Period Weeks   Status New   PT SHORT TERM GOAL #2   Title Pt to be able to go up and down steps in a reciprocal manner   Time 4   Period Weeks   Status New   PT SHORT TERM GOAL #3   Title Pt pain to be no more than a 4/10 to allow pt to walk for 1/2 mile   Time 4   Period Weeks   Status New   PT SHORT TERM GOAL #4   Title Pt to state that his knee buckles no more than one time a day for decreased risk of falling    Time 4   Period Weeks   Status New   PT SHORT TERM GOAL #5   Title Pt ROM to be at least 120 degrees to allow pt to squat to get into lower cabinets in the kitchen/ bathroom.    Time 2   Period Weeks   Status New           PT Long Term Goals - 11/24/15 1436    PT LONG TERM GOAL #1   Title Pt pain to be no greater  than a 2/10 to allow pt to walk dogs for a mile    Time 8   Period Weeks   Status New   PT LONG TERM GOAL #2   Title Pt to be ablet to squat and return from squatted postiong to be able to pick items off the floor.    Time 4   Period Weeks   Status New   PT LONG TERM GOAL #3   Title Pt to verbalize no incident of knee giving away in the past two weeks for reduce risk of falls    Time 8   Period Weeks   Status New   PT LONG TERM GOAL #4   Title PT to be able to verbalize that he feels secure walking over uneven terrain for 20 mintues or more to be able to walk his dogs on his land.    Time 4   Period Weeks   Status Achieved               Plan - 12/13/15 1806    Clinical Impression Statement PT with increased pain.  Urged to contact MD office regarding increase (pain was 3/10 last session).  Began with WB exercises, however too painful so transitioned to NWB exercises.  Pt with pain with full extension of Lt knee with most comfort in slightly bent position.     Pt will benefit from skilled therapeutic intervention in order to improve on the following deficits Abnormal gait;Decreased range of motion;Difficulty walking;Decreased activity tolerance;Pain;Impaired flexibility;Decreased balance;Decreased mobility;Decreased strength;Increased edema   Clinical Impairments Affecting Rehab Potential hx of arthritis; recent exacerbation of L knee pain on 10/16/15   PT Frequency 3x / week   PT Duration 8 weeks   PT Treatment/Interventions Cryotherapy;Electrical Stimulation;Ultrasound;Moist Heat;Iontophoresis 86m/ml Dexamethasone;Gait training;Stair training;Therapeutic activities;Therapeutic exercise;Balance training;Patient/family education;Manual techniques   PT Next Visit Plan  Follow up with MD contact.  May add IFES next session if pain still high (per Gabe PT).   PT Home Exercise Plan continue to encourage consistency with HEP.        Problem List Patient  Active Problem List    Diagnosis Date Noted  . COPD (chronic obstructive pulmonary disease) ([place]) 10/20/2015  . Seborrheic dermatitis of scalp 01/11/2014  . DDD (degenerative disc disease), lumbar 01/11/2014  . Decreased reflex 02/18/2013  . Borderline diabetes 02/18/2013  . Mild hyperlipidemia 02/18/2013  . Hip pain 11/13/2012  . BPH (benign prostatic hyperplasia) 03/09/2012  . Cataract 03/09/2012   Teena Irani, PTA/CLT [phone]  12/13/2015, [place], [place], [address] Phone: [phone]   Fax:  [phone]  Name: Benjamin [name] MRN: [id number] Date of Birth: 31-Oct-1940   PHYSICAL THERAPY DISCHARGE SUMMARY  Visits from Start of Care: 5  Current functional level related to goals / functional outcomes: Pt still having pain    Remaining deficits: Limited in ambulation    Education / Equipment: HEP  Plan: Patient agrees to discharge.  Patient goals were not met. Patient is being discharged due to a change in medical status.  ?????       Pt having surgery Rayetta Humphrey, [place] CLT [phone]

## 2015-12-14 ENCOUNTER — Ambulatory Visit (HOSPITAL_COMMUNITY): Payer: Medicare Other

## 2015-12-14 ENCOUNTER — Telehealth: Payer: Self-pay | Admitting: Family Medicine

## 2015-12-14 ENCOUNTER — Telehealth (HOSPITAL_COMMUNITY): Payer: Self-pay | Admitting: Physical Therapy

## 2015-12-14 ENCOUNTER — Encounter: Payer: Self-pay | Admitting: Gastroenterology

## 2015-12-14 DIAGNOSIS — H25812 Combined forms of age-related cataract, left eye: Secondary | ICD-10-CM | POA: Diagnosis not present

## 2015-12-14 DIAGNOSIS — H5202 Hypermetropia, left eye: Secondary | ICD-10-CM | POA: Diagnosis not present

## 2015-12-14 DIAGNOSIS — H53022 Refractive amblyopia, left eye: Secondary | ICD-10-CM | POA: Diagnosis not present

## 2015-12-14 DIAGNOSIS — Z961 Presence of intraocular lens: Secondary | ICD-10-CM | POA: Diagnosis not present

## 2015-12-14 NOTE — Telephone Encounter (Signed)
Dexilant requires PA.   PA submitted. Patient given samples.

## 2015-12-14 NOTE — Telephone Encounter (Signed)
Insurance is not covering this medication for this patient would like a call back regarding this  [phone] dexilant

## 2015-12-14 NOTE — Telephone Encounter (Signed)
Patient requested that last progress note be faxed to Dr. Lorenz Coaster. Done 12/14/15 NF

## 2015-12-15 DIAGNOSIS — M2392 Unspecified internal derangement of left knee: Secondary | ICD-10-CM | POA: Diagnosis not present

## 2015-12-15 MED ORDER — PANTOPRAZOLE SODIUM 40 MG PO TBEC: 40.0000 mg | DELAYED_RELEASE_TABLET | Freq: Every day | ORAL | Status: DC

## 2015-12-15 NOTE — Telephone Encounter (Signed)
Received PA determination.   PA denied.   Patient must try and fail nexium, lansoprazole, prilosec, protoix, or rabeprazole.

## 2015-12-15 NOTE — Telephone Encounter (Signed)
Send protonix 40mg  daily

## 2015-12-15 NOTE — Telephone Encounter (Signed)
Prescription sent to pharmacy.   Call placed to patient. LMTRC.  

## 2015-12-18 ENCOUNTER — Encounter (HOSPITAL_COMMUNITY): Payer: Self-pay | Admitting: Physical Therapy

## 2015-12-18 NOTE — Telephone Encounter (Signed)
Call placed to patient and patient made aware.  

## 2015-12-19 ENCOUNTER — Telehealth: Payer: Self-pay | Admitting: *Deleted

## 2015-12-19 ENCOUNTER — Ambulatory Visit (HOSPITAL_COMMUNITY): Payer: Medicare Other | Attending: Orthopedic Surgery | Admitting: Physical Therapy

## 2015-12-19 DIAGNOSIS — M7122 Synovial cyst of popliteal space [Baker], left knee: Secondary | ICD-10-CM | POA: Diagnosis not present

## 2015-12-19 DIAGNOSIS — M25562 Pain in left knee: Secondary | ICD-10-CM | POA: Insufficient documentation

## 2015-12-19 DIAGNOSIS — R2681 Unsteadiness on feet: Secondary | ICD-10-CM | POA: Insufficient documentation

## 2015-12-19 DIAGNOSIS — R2689 Other abnormalities of gait and mobility: Secondary | ICD-10-CM | POA: Insufficient documentation

## 2015-12-19 DIAGNOSIS — R29898 Other symptoms and signs involving the musculoskeletal system: Secondary | ICD-10-CM | POA: Insufficient documentation

## 2015-12-19 DIAGNOSIS — S83242A Other tear of medial meniscus, current injury, left knee, initial encounter: Secondary | ICD-10-CM | POA: Diagnosis not present

## 2015-12-19 DIAGNOSIS — M25462 Effusion, left knee: Secondary | ICD-10-CM | POA: Diagnosis not present

## 2015-12-19 DIAGNOSIS — R600 Localized edema: Secondary | ICD-10-CM | POA: Insufficient documentation

## 2015-12-19 DIAGNOSIS — Z9889 Other specified postprocedural states: Secondary | ICD-10-CM | POA: Diagnosis not present

## 2015-12-19 NOTE — Telephone Encounter (Signed)
Received PA determination.   Pa approved 10/20/2015- 12/18/2016.  Pharmacy made aware.

## 2015-12-19 NOTE — Telephone Encounter (Signed)
Received request from pharmacy for PA on Protonix.   PA submitted.   Dx: R10.84, K21.9.

## 2015-12-20 ENCOUNTER — Ambulatory Visit (HOSPITAL_COMMUNITY): Payer: Medicare Other | Admitting: Physical Therapy

## 2015-12-20 ENCOUNTER — Telehealth (HOSPITAL_COMMUNITY): Payer: Self-pay | Admitting: Physical Therapy

## 2015-12-20 NOTE — Telephone Encounter (Signed)
Called patient re: missed appointment.  Pt had forgotten.  Reminded pt of appointment on 4/10.  Rayetta Humphrey, [place] CLT [phone]

## 2015-12-22 ENCOUNTER — Encounter (HOSPITAL_COMMUNITY): Payer: Self-pay

## 2015-12-22 DIAGNOSIS — M2392 Unspecified internal derangement of left knee: Secondary | ICD-10-CM | POA: Diagnosis not present

## 2015-12-25 ENCOUNTER — Ambulatory Visit (HOSPITAL_COMMUNITY): Payer: Medicare Other | Admitting: Physical Therapy

## 2015-12-27 ENCOUNTER — Encounter (HOSPITAL_COMMUNITY): Payer: Self-pay

## 2015-12-27 DIAGNOSIS — Y929 Unspecified place or not applicable: Secondary | ICD-10-CM | POA: Diagnosis not present

## 2015-12-27 DIAGNOSIS — S83242A Other tear of medial meniscus, current injury, left knee, initial encounter: Secondary | ICD-10-CM | POA: Diagnosis not present

## 2015-12-27 DIAGNOSIS — Y999 Unspecified external cause status: Secondary | ICD-10-CM | POA: Diagnosis not present

## 2015-12-27 DIAGNOSIS — Y939 Activity, unspecified: Secondary | ICD-10-CM | POA: Diagnosis not present

## 2015-12-27 DIAGNOSIS — M179 Osteoarthritis of knee, unspecified: Secondary | ICD-10-CM | POA: Diagnosis not present

## 2015-12-27 DIAGNOSIS — G8918 Other acute postprocedural pain: Secondary | ICD-10-CM | POA: Diagnosis not present

## 2015-12-29 ENCOUNTER — Encounter (HOSPITAL_COMMUNITY): Payer: Self-pay

## 2016-01-01 ENCOUNTER — Encounter (HOSPITAL_COMMUNITY): Payer: Self-pay | Admitting: Physical Therapy

## 2016-01-01 ENCOUNTER — Ambulatory Visit (HOSPITAL_COMMUNITY): Payer: Medicare Other | Admitting: Physical Therapy

## 2016-01-01 DIAGNOSIS — R2681 Unsteadiness on feet: Secondary | ICD-10-CM

## 2016-01-01 DIAGNOSIS — R29898 Other symptoms and signs involving the musculoskeletal system: Secondary | ICD-10-CM | POA: Diagnosis not present

## 2016-01-01 DIAGNOSIS — R2689 Other abnormalities of gait and mobility: Secondary | ICD-10-CM | POA: Diagnosis not present

## 2016-01-01 DIAGNOSIS — R600 Localized edema: Secondary | ICD-10-CM | POA: Diagnosis not present

## 2016-01-01 DIAGNOSIS — M25562 Pain in left knee: Secondary | ICD-10-CM | POA: Diagnosis not present

## 2016-01-01 NOTE — Therapy (Signed)
[facility] [address] [place], [place], [address] Phone: [phone]   Fax:  [phone]  Physical Therapy Evaluation  Patient Details  Name: Benjamin [name] MRN: [id number] Date of Birth: [birth year] Referring Provider: Earlie Server, MD  Encounter Date: 01/01/2016      PT End of Session - 01/01/16 1617    Visit Number 1   Number of Visits 12   Date for PT Re-Evaluation 12/24/15   Authorization Type Medicare   Authorization Time Period 01/01/16 to 02/14/16   Authorization - Number of Visits 10   PT Start Time 1510   PT Stop Time 1600   PT Time Calculation (min) 50 min   Equipment Utilized During Treatment Gait belt   Activity Tolerance Patient tolerated treatment well   Behavior During Therapy [facility] for tasks assessed/performed      Past Medical History  Diagnosis Date  . Enlarged prostate   . Vision problems     Blind x 20 years, regained site 2000, ? optic nerve injury  . Abnormal EKG     History per report of ST elevations x 20 years, no cardiac history-anomale with aorta-sts "it always looks like i am having a heart atttack on my EKG  . Headache(784.0)     last one 5 yrs ago  . Arthritis   . COPD (chronic obstructive pulmonary disease) [facility])     Past Surgical History  Procedure Laterality Date  . Appendectomy      age 89  . Right shoulder    . Left elbow    . Left thumb    . Cataract extraction w/phaco  07/27/2012    Procedure: CATARACT EXTRACTION PHACO AND INTRAOCULAR LENS PLACEMENT (IOC);  Surgeon: [name], MD;  Location: [facility];  Service: Ophthalmology;  Laterality: Right;  CDE: 12.55    There were no vitals filed for this visit.       Subjective Assessment - 01/01/16 1509    Subjective Pt had surgery back in January of this year, he twisted his knee a few weeks after and decided to have another operation to remove the damaged cartilage. He had surgery on 12/27/15 and is doing "excellent". No pain, but a little  discomfort occasionally.    Pertinent History L mensical tear, arthritis, and COPD    Limitations Walking;Standing;Sitting;House hold activities  Unable to do any of these for long periods of time   How long can you sit comfortably? 15 minutes   How long can you stand comfortably? 15 minutes   How long can you walk comfortably? 15 yards, with [facility]   Patient Stated Goals no pain, improve strength and ROM to return to PLOF.   Currently in Pain? No/denies            [facility] PT Assessment - 01/01/16 0001    Assessment   Medical Diagnosis Lt medial mesniscus   Referring Provider Earlie Server, MD   Onset Date/Surgical Date 12/29/15   Hand Dominance Right   Next MD Visit unsure   Prior Therapy OPPT for medial meniscectomy   Precautions   Precautions None   Restrictions   Weight Bearing Restrictions No   Balance Screen   Has the patient fallen in the past 6 months Yes   How many times? 5  None since accident   Has the patient had a decrease in activity level because of a fear of falling?  No   Is the patient reluctant to leave their home because  of a fear of falling?  No   Home Ecologist residence   Home Access Stairs to enter   Entrance Stairs-Number of Steps 5    Prior Function   Level of Independence Independent   Vocation Retired   Leisure walking the dog 2x a day 1/4/ a mile; computers, Information systems manager   Overall Cognitive Status Within Functional Limits for tasks assessed   Observation/Other Assessments   Observations Pt wearing patellar stabilizing brace   Focus on Therapeutic Outcomes (FOTO)  58% limited   Observation/Other Assessments-Edema    Edema Circumferential  L/R: 40cm/37.5cm   Single Leg Stance   Comments Rt: 10 sec/10 sec    Sit to Stand   Comments 27.9 sec   AROM   Right/Left Knee Right   Right Knee Extension 0   Right Knee Flexion 140   Left Knee Extension 5   Left Knee Flexion 125   Strength   Right/Left  Hip Right;Left   Right Hip Flexion 5/5   Right Hip Extension 5/5   Right Hip ABduction 5/5   Left Hip Flexion 2/5  due to pt refusing manual resistance   Left Hip Extension 5/5   Left Hip ABduction 4+/5   Left Knee Flexion 4-/5   Left Knee Extension 4/5   Left Ankle Dorsiflexion 5/5   Palpation   Palpation comment non tender to palpation    Ambulation/Gait   Ambulation/Gait Yes   Ambulation/Gait Assistance 6: Modified independent (Device/Increase time)   Ambulation Distance (Feet) 20 Feet   Assistive device Straight cane   Gait Pattern Step-through pattern;Decreased arm swing - left;Decreased step length - right   Ambulation Surface Level   Stairs Yes   Stairs Assistance 6: Modified independent (Device/Increase time)   Stair Management Technique One rail Right;Step to pattern   Number of Stairs 5   Gait Comments Pt using SPC in LUE during descend, therapist correcting tehcnique with verbal/visual cues   Timed Up and Go Test   TUG Normal TUG   Normal TUG (seconds) 22  with SPC                           PT Education - 01/01/16 1615    Education provided Yes   Education Details reviewed/initiated HEP; Eval findings and POC; correct use of SPC during stair negotiation   Person(s) Educated Patient   Methods Explanation;Demonstration;Verbal cues;Handout   Comprehension Verbalized understanding;Returned demonstration;Need further instruction          PT Short Term Goals - 01/01/16 1621    PT SHORT TERM GOAL #1   Title Patient will independently verbalize and demo correct completion of her initial HEP atleast 3x a week.   Time 2   Period Weeks   Status New   PT SHORT TERM GOAL #2   Title Pt to demonstrate improved sequencing and reciprocal pattern while ascending/descending 5 stairs with no more than 1 handrail and without cuing from therapist, 2/3 trials.    Time 3   Period Weeks   Status New   PT SHORT TERM GOAL #3   Title Patient will report  reduced max L knee pain to 4/10 on a VAS in order to improve tolerance with ther ex and ambulation.     Time 3   Period Weeks   Status New   PT SHORT TERM GOAL #4   Title Patient will independently verbalize pain management  strategies including use of cryotherapy in order to manage pain levels with ADLs and functional mobility activities.   Time 2   Period Weeks   Status New   PT SHORT TERM GOAL #5   Title Patient will demo improved L knee AAROM to 0-130 degrees in order to improve knee mobility and ROM with stair climbing and leisure activities.    Time 3   Period Weeks   Status New           PT Long Term Goals - 01/01/16 1625    PT LONG TERM GOAL #1   Title Pt pain to be no greater than a 2/10 to allow pt to return to daily activitiy.    Time 6   Period Weeks   Status New   PT LONG TERM GOAL #2   Title Pt to demonstrate improved power and strength, evident by his ability to perform 5  times sit to stand in less than 10 sec.   Time 6   Period Weeks   Status New   PT LONG TERM GOAL #3   Title Patient will demo improved BLE strength to 4+/5 MMT in order to improve performance with outdoor ambulation and stair climbing.     Time 6   Period Weeks   Status New   PT LONG TERM GOAL #4   Title Patient will independently ambulate indoors on level terrain with LRAD and with good heel to toe sequence for 1067ft and with steady balance in order to progress towards safe community ambulation.   Time 6   Period Weeks   Status New   PT LONG TERM GOAL #5   Title Pt to demonstrate SLS on each LE for atleast 20 sec without LOB, to improve safety with stair negotiation and other activity.   Time 6   Period Weeks   Status New               Plan - 01/01/16 1618    Clinical Impression Statement Mr. Panciera is a 75 yo male who was referred to outpatient physical therapy s/p L knee arthroscopy with medial meniscectomy performed on 12/29/15 completed by Dr. French Ana. Upon examination,  the patient presents with increased L knee swelling, pain, impaired L knee AROM, impaired L quad/HS strength, limited mobility. In addition, patient is negotiating stairs with a step-to pattern with decreased stance phase onto the L LE and poor use of SPC for support. Instructed the patient on proper pattern and sequence with use of SPC in R hand. Patient had initial difficulty with coordination and sequence, but was able to demo improved step-to, continuous stair negotiation with some practice. Initiated basic post-op L knee ther ex with good tolerance reported by the patient. Sufficient quad strength assessed via MMT. Pt was wearing band-aids over surgical portal sites and was not wanting to remove them this session. Therapist initially suggesting pt come 3x/week, however he is unable to do this because of his schedule and upcoming procedures. Pt would benefit from skilled physical therapy for 2x/week for 6 weeks in order to reduce L knee pain, improve gait pattern/sequence without an AD, and restore L knee ROM/strength. Therapist discussed importance of HEP adherence atleast 3x/week. Patient is in agreement with the proposed physical therapy POC and frequency.      Rehab Potential Good   Clinical Impairments Affecting Rehab Potential hx of arthritis; recent L knee arthroscopy; HEP adherence   PT Frequency 2x / week   PT Duration 6 weeks  PT Treatment/Interventions Cryotherapy;Dentist;Therapeutic activities;Therapeutic exercise;Balance training;Patient/family education;Manual techniques;Functional mobility training;Scar mobilization;Passive range of motion;Taping   PT Next Visit Plan Knee extension ROM; manual treatment to address soft tissue/scar mobility; LLE strengthening   PT Home Exercise Plan initiated and needs to continue to encourage compliance   Consulted and Agree with Plan of Care Patient      Patient will benefit from skilled  therapeutic intervention in order to improve the following deficits and impairments:  Abnormal gait, Decreased range of motion, Difficulty walking, Decreased activity tolerance, Pain, Impaired flexibility, Decreased balance, Decreased mobility, Decreased strength, Increased edema  Visit Diagnosis: Localized edema - Plan: PT plan of care cert/re-cert  Unsteadiness on feet - Plan: PT plan of care cert/re-cert  Other symptoms and signs involving the musculoskeletal system - Plan: PT plan of care cert/re-cert  Other abnormalities of gait and mobility - Plan: PT plan of care cert/re-cert  Pain in left knee - Plan: PT plan of care cert/re-cert      G-Codes - [id number]    Functional Assessment Tool Used FOTO 58% impaired   Functional Limitation Mobility: Walking and moving around   Mobility: Walking and Moving Around Current Status [phone]) At least 40 percent but less than 60 percent impaired, limited or restricted   Mobility: Walking and Moving Around Goal Status [phone]) At least 1 percent but less than 20 percent impaired, limited or restricted       Problem List Patient Active Problem List   Diagnosis Date Noted  . COPD (chronic obstructive pulmonary disease) ([place]) 10/20/2015  . Seborrheic dermatitis of scalp 01/11/2014  . DDD (degenerative disc disease), lumbar 01/11/2014  . Decreased reflex 02/18/2013  . Borderline diabetes 02/18/2013  . Mild hyperlipidemia 02/18/2013  . Hip pain 11/13/2012  . BPH (benign prostatic hyperplasia) 03/09/2012  . Cataract 03/09/2012    4:53 PM,01/01/2016 Elly Modena PT, DPT [facility] [address] [place], [place], [address] Phone: [phone]   Fax:  [phone]  Name: Winford Wacha MRN: [id number] Date of Birth: 11-14-1940

## 2016-01-01 NOTE — Patient Instructions (Signed)
  KNEE EXTENSION STRETCH - PROPPED AND WEIGHTED  While seated, prop your foot up on another chair and allow gravity to stretch your knee towards a more straightened position. Place a weight such as a back pack, ankle weight or other item on the knee for an increased stretch. Hold as long as tolerated, during TV show, etc.    PRONE HIP EXTENSION  While lying face down with your knee straight, slowly raise up leg off the ground while pressing the opposite LE into the table/floor. Keep hips flat. x10 reps each side.   BRIDGING  While lying on your back, tighten your lower abdominals, squeeze your buttocks and then raise your buttocks off the floor/bed as creating a "Bridge" with your body.  2x10 reps

## 2016-01-02 ENCOUNTER — Other Ambulatory Visit: Payer: Self-pay

## 2016-01-02 ENCOUNTER — Encounter: Payer: Self-pay | Admitting: Gastroenterology

## 2016-01-02 ENCOUNTER — Encounter (HOSPITAL_COMMUNITY): Payer: Self-pay | Admitting: Physical Therapy

## 2016-01-02 ENCOUNTER — Ambulatory Visit (INDEPENDENT_AMBULATORY_CARE_PROVIDER_SITE_OTHER): Payer: Medicare Other | Admitting: Gastroenterology

## 2016-01-02 ENCOUNTER — Ambulatory Visit (HOSPITAL_COMMUNITY): Payer: Medicare Other | Admitting: Physical Therapy

## 2016-01-02 VITALS — BP 118/84 | HR 90 | Temp 96.7°F | Ht 71.5 in | Wt 181.0 lb

## 2016-01-02 DIAGNOSIS — M25562 Pain in left knee: Secondary | ICD-10-CM | POA: Diagnosis not present

## 2016-01-02 DIAGNOSIS — R29898 Other symptoms and signs involving the musculoskeletal system: Secondary | ICD-10-CM | POA: Diagnosis not present

## 2016-01-02 DIAGNOSIS — Z1211 Encounter for screening for malignant neoplasm of colon: Secondary | ICD-10-CM

## 2016-01-02 DIAGNOSIS — R1013 Epigastric pain: Secondary | ICD-10-CM

## 2016-01-02 DIAGNOSIS — R600 Localized edema: Secondary | ICD-10-CM | POA: Diagnosis not present

## 2016-01-02 DIAGNOSIS — R2689 Other abnormalities of gait and mobility: Secondary | ICD-10-CM

## 2016-01-02 DIAGNOSIS — Z7689 Persons encountering health services in other specified circumstances: Secondary | ICD-10-CM | POA: Insufficient documentation

## 2016-01-02 DIAGNOSIS — R2681 Unsteadiness on feet: Secondary | ICD-10-CM

## 2016-01-02 MED ORDER — PEG 3350-KCL-NA BICARB-NACL 420 G PO SOLR: 4000.0000 mL | Freq: Once | ORAL | Status: DC

## 2016-01-02 MED ORDER — ONDANSETRON HCL 4 MG PO TABS: 4.0000 mg | ORAL_TABLET | Freq: Three times a day (TID) | ORAL | Status: DC

## 2016-01-02 NOTE — Patient Instructions (Signed)
I have sent Zofran to your pharmacy to take scheduled 3 times a day.   We have scheduled you for a colonoscopy and upper endoscopy with Dr. Oneida Alar in the near future.  Further recommendations once this is completed!

## 2016-01-02 NOTE — Therapy (Signed)
[facility] [address] [place], [place], [address] Phone: [phone]   Fax:  [phone]  Physical Therapy Treatment  Patient Details  Name: Benjamin [name] MRN: [id number] Date of Birth: [birth year] Referring Provider: Earlie Server, MD  Encounter Date: 01/02/2016      PT End of Session - 01/02/16 1149    Visit Number 2   Number of Visits 12   Date for PT Re-Evaluation 12/24/15   Authorization Time Period 01/01/16 to 02/14/16   Authorization - Visit Number 2   Authorization - Number of Visits 10   PT Start Time 1110   PT Stop Time 1153   PT Time Calculation (min) 43 min   Activity Tolerance Patient tolerated treatment well      Past Medical History  Diagnosis Date  . Enlarged prostate   . Vision problems     Blind x 20 years, regained site 2000, ? optic nerve injury  . Abnormal EKG     History per report of ST elevations x 20 years, no cardiac history-anomale with aorta-sts "it always looks like i am having a heart atttack on my EKG  . Headache(784.0)     last one 5 yrs ago  . Arthritis   . COPD (chronic obstructive pulmonary disease) [facility])     Past Surgical History  Procedure Laterality Date  . Appendectomy      age 20  . Right shoulder    . Left elbow    . Left thumb    . Cataract extraction w/phaco  07/27/2012    Procedure: CATARACT EXTRACTION PHACO AND INTRAOCULAR LENS PLACEMENT (IOC);  Surgeon: [name], MD;  Location: [facility];  Service: Ophthalmology;  Laterality: Right;  CDE: 12.55    There were no vitals filed for this visit.      Subjective Assessment - 01/02/16 1115    Subjective Pt states that his pain is at a 0 and it is going to stay a 0.  Pt states that he will not do any activity that increases his knee pain.     Pertinent History L mensical tear, arthritis, and COPD    Currently in Pain? No/denies                         [facility] Adult PT Treatment/Exercise - 01/02/16 0001    Knee/Hip  Exercises: Stretches   Active Hamstring Stretch 30 seconds;2 reps   Active Hamstring Stretch Limitations 12in step   Gastroc Stretch 3 reps;30 seconds   Gastroc Stretch Limitations slant board   Knee/Hip Exercises: Standing   Heel Raises Both;10 reps   Knee Flexion Strengthening;Left   Knee Flexion Limitations 3   Forward Lunges Left;10 reps;Other (comment)   Forward Lunges Limitations 2"    Terminal Knee Extension Limitations 10 x    Lateral Step Up Left;10 reps;Hand Hold: 1;Step Height: 2"   Forward Step Up Left;10 reps;Hand Hold: 1;Step Height: 2"   Functional Squat 10 reps   SLS x 3 both LE    Knee/Hip Exercises: Supine   Quad Sets Strengthening;10 reps   Heel Slides 10 reps   Knee/Hip Exercises: Sidelying   Hip ABduction Strengthening;10 reps                PT Education - 01/01/16 1615    Education provided Yes   Education Details reviewed/initiated HEP; Eval findings and POC; correct use of SPC during stair negotiation   Person(s) Educated Patient  Methods Explanation;Demonstration;Verbal cues;Handout   Comprehension Verbalized understanding;Returned demonstration;Need further instruction          PT Short Term Goals - 01/02/16 1153    PT SHORT TERM GOAL #1   Title Patient will independently verbalize and demo correct completion of her initial HEP atleast 3x a week.   Time 2   Period Weeks   Status On-going   PT SHORT TERM GOAL #2   Title Pt to demonstrate improved sequencing and reciprocal pattern while ascending/descending 5 stairs with no more than 1 handrail and without cuing from therapist, 2/3 trials.    Time 3   Period Weeks   Status On-going   PT SHORT TERM GOAL #3   Title Patient will report reduced max L knee pain to 4/10 on a VAS in order to improve tolerance with ther ex and ambulation.     Time 3   Period Weeks   Status On-going   PT SHORT TERM GOAL #4   Title Patient will independently verbalize pain management strategies including use  of cryotherapy in order to manage pain levels with ADLs and functional mobility activities.   Time 2   Period Weeks   Status On-going   PT SHORT TERM GOAL #5   Title Patient will demo improved L knee AAROM to 0-130 degrees in order to improve knee mobility and ROM with stair climbing and leisure activities.    Time 3   Period Weeks   Status Partially Met           PT Long Term Goals - 01/02/16 1154    PT LONG TERM GOAL #1   Title Pt pain to be no greater than a 2/10 to allow pt to return to daily activitiy.    Time 6   Period Weeks   Status Partially Met   PT LONG TERM GOAL #2   Title Pt to demonstrate improved power and strength, evident by his ability to perform 5  times sit to stand in less than 10 sec.   Time 6   Period Weeks   Status On-going   PT LONG TERM GOAL #3   Title Patient will demo improved BLE strength to 4+/5 MMT in order to improve performance with outdoor ambulation and stair climbing.     Time 6   Period Weeks   Status On-going   PT LONG TERM GOAL #4   Title Patient will independently ambulate indoors on level terrain with LRAD and with good heel to toe sequence for 1022f and with steady balance in order to progress towards safe community ambulation.   Time 6   Period Weeks   Status On-going   PT LONG TERM GOAL #5   Title Pt to demonstrate SLS on each LE for atleast 20 sec without LOB, to improve safety with stair negotiation and other activity.   Time 6   Period Weeks   Status On-going               Plan - 01/02/16 1149    Clinical Impression Statement Evaluation and goals reviewed with patient.  Progressed pt to closed chain functional exercises with modification when needed to keep patient pain free.  Pt ROM has improved to 2- 135.  Pt demonstrates improved hip strength as well.     Rehab Potential Good   Clinical Impairments Affecting Rehab Potential hx of arthritis; recent L knee arthroscopy; HEP adherence   PT Frequency 2x / week   PT  Duration 6  weeks   PT Treatment/Interventions Cryotherapy;Dentist;Therapeutic activities;Therapeutic exercise;Balance training;Patient/family education;Manual techniques;Functional mobility training;Scar mobilization;Passive range of motion;Taping   PT Next Visit Plan Continue with functional strengthening of knee manual as needed       Patient will benefit from skilled therapeutic intervention in order to improve the following deficits and impairments:  Abnormal gait, Decreased range of motion, Difficulty walking, Decreased activity tolerance, Pain, Impaired flexibility, Decreased balance, Decreased mobility, Decreased strength, Increased edema  Visit Diagnosis: Unsteadiness on feet  Other symptoms and signs involving the musculoskeletal system  Other abnormalities of gait and mobility       G-Codes - 01/27/2016 1633    Functional Assessment Tool Used FOTO 58% impaired   Functional Limitation Mobility: Walking and moving around   Mobility: Walking and Moving Around Current Status [phone]) At least 40 percent but less than 60 percent impaired, limited or restricted   Mobility: Walking and Moving Around Goal Status [phone]) At least 1 percent but less than 20 percent impaired, limited or restricted      Problem List Patient Active Problem List   Diagnosis Date Noted  . COPD (chronic obstructive pulmonary disease) ([place]) 10/20/2015  . Seborrheic dermatitis of scalp 01/11/2014  . DDD (degenerative disc disease), lumbar 01/11/2014  . Decreased reflex 02/18/2013  . Borderline diabetes 02/18/2013  . Mild hyperlipidemia 02/18/2013  . Hip pain 11/13/2012  . BPH (benign prostatic hyperplasia) 03/09/2012  . Cataract 03/09/2012    Rayetta Humphrey, PT CLT [phone] 01/02/2016, 11:55 AM  [facility] [address] [place], [place], [address] Phone: [phone]   Fax:  [phone]  Name:  Benjamin [name] MRN: [id number] Date of Birth: August 08, 1941

## 2016-01-02 NOTE — Patient Instructions (Signed)
Your procedure is scheduled on: 01/08/2016   Report to [facility] at  800   AM.  Call this number if you have problems the morning of surgery: [phone]   Do not eat food or drink liquids :After Midnight.      Take these medicines the morning of surgery with A SIP OF WATER: zofran, protonix, ultram, flomax   Do not wear jewelry, make-up or nail polish.  Do not wear lotions, powders, or perfumes. You may wear deodorant.  Do not shave 48 hours prior to surgery.  Do not bring valuables to the hospital.  Contacts, dentures or bridgework may not be worn into surgery.  Leave suitcase in the car. After surgery it may be brought to your room.  For patients admitted to the hospital, checkout time is 11:00 AM the day of discharge.   Patients discharged the day of surgery will not be allowed to drive home.  :     Please read over the following fact sheets that you were given: Coughing and Deep Breathing, Surgical Site Infection Prevention, Anesthesia Post-op Instructions and Care and Recovery After Surgery    Cataract A cataract is a clouding of the lens of the eye. When a lens becomes cloudy, vision is reduced based on the degree and nature of the clouding. Many cataracts reduce vision to some degree. Some cataracts make people more near-sighted as they develop. Other cataracts increase glare. Cataracts that are ignored and become worse can sometimes look white. The white color can be seen through the pupil. CAUSES   Aging. However, cataracts may occur at any age, even in newborns.   Certain drugs.   Trauma to the eye.   Certain diseases such as diabetes.   Specific eye diseases such as chronic inflammation inside the eye or a sudden attack of a rare form of glaucoma.   Inherited or acquired medical problems.  SYMPTOMS   Gradual, progressive drop in vision in the affected eye.   Severe, rapid visual loss. This most often happens when trauma is the cause.  DIAGNOSIS  To detect a  cataract, an eye doctor examines the lens. Cataracts are best diagnosed with an exam of the eyes with the pupils enlarged (dilated) by drops.  TREATMENT  For an early cataract, vision may improve by using different eyeglasses or stronger lighting. If that does not help your vision, surgery is the only effective treatment. A cataract needs to be surgically removed when vision loss interferes with your everyday activities, such as driving, reading, or watching TV. A cataract may also have to be removed if it prevents examination or treatment of another eye problem. Surgery removes the cloudy lens and usually replaces it with a substitute lens (intraocular lens, IOL).  At a time when both you and your doctor agree, the cataract will be surgically removed. If you have cataracts in both eyes, only one is usually removed at a time. This allows the operated eye to heal and be out of danger from any possible problems after surgery (such as infection or poor wound healing). In rare cases, a cataract may be doing damage to your eye. In these cases, your caregiver may advise surgical removal right away. The vast majority of people who have cataract surgery have better vision afterward. HOME CARE INSTRUCTIONS  If you are not planning surgery, you may be asked to do the following:  Use different eyeglasses.   Use stronger or brighter lighting.   Ask your eye doctor about  reducing your medicine dose or changing medicines if it is thought that a medicine caused your cataract. Changing medicines does not make the cataract go away on its own.   Become familiar with your surroundings. Poor vision can lead to injury. Avoid bumping into things on the affected side. You are at a higher risk for tripping or falling.   Exercise extreme care when driving or operating machinery.   Wear sunglasses if you are sensitive to bright light or experiencing problems with glare.  SEEK IMMEDIATE MEDICAL CARE IF:   You have a  worsening or sudden vision loss.   You notice redness, swelling, or increasing pain in the eye.   You have a fever.  Document Released: 09/02/2005 Document Revised: 08/22/2011 Document Reviewed: 04/26/2011 [facility] Patient Information 2012 [place].PATIENT INSTRUCTIONS POST-ANESTHESIA  IMMEDIATELY FOLLOWING SURGERY:  Do not drive or operate machinery for the first twenty four hours after surgery.  Do not make any important decisions for twenty four hours after surgery or while taking narcotic pain medications or sedatives.  If you develop intractable nausea and vomiting or a severe headache please notify your doctor immediately.  FOLLOW-UP:  Please make an appointment with your surgeon as instructed. You do not need to follow up with anesthesia unless specifically instructed to do so.  WOUND CARE INSTRUCTIONS (if applicable):  Keep a dry clean dressing on the anesthesia/puncture wound site if there is drainage.  Once the wound has quit draining you may leave it open to air.  Generally you should leave the bandage intact for twenty four hours unless there is drainage.  If the epidural site drains for more than 36-48 hours please call the anesthesia department.  QUESTIONS?:  Please feel free to call your physician or the hospital operator if you have any questions, and they will be happy to assist you.

## 2016-01-02 NOTE — Progress Notes (Signed)
CC'ED TO PCP 

## 2016-01-02 NOTE — Assessment & Plan Note (Signed)
75 year old male with new onset RUQ and diffuse abdominal discomfort, nausea, and bloating, with normal gallbladder on [place], normal LFTs, CBC, and lipase. No prior EGD. Differentials including gastritis, PUD, unable to exclude biliary dyskinesia, possible gastroparesis as a culprit for nausea and bloating. Will proceed with EGD first and determine next work-up thereafter. May need HIDA and/or GES. No weight loss noted. As of note, he reports a contrast media allergy but states he HAS had scans in the past with dye and did well when he was pre-medicated.   Proceed with upper endoscopy in the near future with Dr. Oneida Alar. The risks, benefits, and alternatives have been discussed in detail with patient. They have stated understanding and desire to proceed.  Phenergan 12.5 mg IV on call Continue Protonix once daily Zofran provided scheduled

## 2016-01-02 NOTE — Assessment & Plan Note (Signed)
Last colonoscopy in remote past, out of state [place], [place].). No prior history of polyps or FH of colon cancer. No concerning lower GI symptoms.   Proceed with colonoscopy with Dr. Oneida Alar in the near future. The risks, benefits, and alternatives have been discussed in detail with the patient. They state understanding and desire to proceed.  Phenergan 12.5 mg IV on call.
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
## 2016-01-03 ENCOUNTER — Encounter (HOSPITAL_COMMUNITY): Payer: Self-pay

## 2016-01-03 ENCOUNTER — Encounter (HOSPITAL_COMMUNITY)
Admission: RE | Admit: 2016-01-03 | Discharge: 2016-01-03 | Disposition: A | Discharge status: Home / Self Care | Payer: Medicare Other | Source: Ambulatory Visit | Attending: Ophthalmology | Admitting: Ophthalmology

## 2016-01-03 ENCOUNTER — Encounter (HOSPITAL_COMMUNITY): Payer: Self-pay | Admitting: Physical Therapy

## 2016-01-03 HISTORY — DX: Gastro-esophageal reflux disease without esophagitis: K21.9

## 2016-01-05 ENCOUNTER — Encounter (HOSPITAL_COMMUNITY): Payer: Self-pay

## 2016-01-05 DIAGNOSIS — S83242A Other tear of medial meniscus, current injury, left knee, initial encounter: Secondary | ICD-10-CM | POA: Diagnosis not present

## 2016-01-08 ENCOUNTER — Ambulatory Visit (HOSPITAL_COMMUNITY): Payer: Medicare Other | Admitting: Anesthesiology

## 2016-01-08 ENCOUNTER — Ambulatory Visit (HOSPITAL_COMMUNITY)
Admission: RE | Admit: 2016-01-08 | Discharge: 2016-01-08 | Disposition: A | Discharge status: Home / Self Care | Payer: Medicare Other | Source: Ambulatory Visit | Attending: Ophthalmology | Admitting: Ophthalmology

## 2016-01-08 ENCOUNTER — Encounter (HOSPITAL_COMMUNITY)
Admission: RE | Disposition: A | Discharge status: Home / Self Care | Payer: Self-pay | Source: Ambulatory Visit | Attending: Ophthalmology

## 2016-01-08 ENCOUNTER — Encounter (HOSPITAL_COMMUNITY): Payer: Self-pay | Admitting: *Deleted

## 2016-01-08 ENCOUNTER — Encounter (HOSPITAL_COMMUNITY): Payer: Self-pay | Admitting: Physical Therapy

## 2016-01-08 DIAGNOSIS — Z79899 Other long term (current) drug therapy: Secondary | ICD-10-CM | POA: Insufficient documentation

## 2016-01-08 DIAGNOSIS — Z87891 Personal history of nicotine dependence: Secondary | ICD-10-CM | POA: Diagnosis not present

## 2016-01-08 DIAGNOSIS — H269 Unspecified cataract: Secondary | ICD-10-CM | POA: Diagnosis not present

## 2016-01-08 DIAGNOSIS — H25812 Combined forms of age-related cataract, left eye: Secondary | ICD-10-CM | POA: Insufficient documentation

## 2016-01-08 DIAGNOSIS — K219 Gastro-esophageal reflux disease without esophagitis: Secondary | ICD-10-CM | POA: Insufficient documentation

## 2016-01-08 DIAGNOSIS — J449 Chronic obstructive pulmonary disease, unspecified: Secondary | ICD-10-CM | POA: Insufficient documentation

## 2016-01-08 DIAGNOSIS — H2181 Floppy iris syndrome: Secondary | ICD-10-CM | POA: Insufficient documentation

## 2016-01-08 HISTORY — PX: CATARACT EXTRACTION W/PHACO: SHX586

## 2016-01-08 SURGERY — PHACOEMULSIFICATION, CATARACT, WITH IOL INSERTION
Anesthesia: Monitor Anesthesia Care | Site: Eye | Laterality: Left

## 2016-01-08 MED ORDER — PHENYLEPHRINE-KETOROLAC 1-0.3 % IO SOLN
INTRAOCULAR | Status: AC
  Filled 2016-01-08: qty 4

## 2016-01-08 MED ORDER — EPINEPHRINE HCL 1 MG/ML IJ SOLN
INTRAMUSCULAR | Status: AC
  Filled 2016-01-08: qty 1

## 2016-01-08 MED ORDER — MIDAZOLAM HCL 2 MG/2ML IJ SOLN
1.0000 mg | INTRAMUSCULAR | Status: DC | PRN
  Administered 2016-01-08: 2 mg via INTRAVENOUS

## 2016-01-08 MED ORDER — FENTANYL CITRATE (PF) 100 MCG/2ML IJ SOLN
INTRAMUSCULAR | Status: AC
  Filled 2016-01-08: qty 2

## 2016-01-08 MED ORDER — BSS IO SOLN
INTRAOCULAR | Status: DC | PRN
Start: 2016-01-08 — End: 2016-01-08
  Administered 2016-01-08: 15 mL via INTRAOCULAR

## 2016-01-08 MED ORDER — EPINEPHRINE HCL 1 MG/ML IJ SOLN: INTRAOCULAR | Status: DC | PRN

## 2016-01-08 MED ORDER — POVIDONE-IODINE 5 % OP SOLN
OPHTHALMIC | Status: DC | PRN
  Administered 2016-01-08: 1 via OPHTHALMIC

## 2016-01-08 MED ORDER — PHENYLEPHRINE-KETOROLAC 1-0.3 % IO SOLN
INTRAOCULAR | Status: DC | PRN
  Administered 2016-01-08: 500 mL via OPHTHALMIC

## 2016-01-08 MED ORDER — LIDOCAINE HCL (PF) 1 % IJ SOLN
INTRAOCULAR | Status: DC | PRN
  Administered 2016-01-08: .8 mL via OPHTHALMIC

## 2016-01-08 MED ORDER — CYCLOPENTOLATE-PHENYLEPHRINE 0.2-1 % OP SOLN
1.0000 [drp] | OPHTHALMIC | Status: AC
  Administered 2016-01-08 (×3): 1 [drp] via OPHTHALMIC

## 2016-01-08 MED ORDER — NEOMYCIN-POLYMYXIN-DEXAMETH 3.5-10000-0.1 OP SUSP
OPHTHALMIC | Status: DC | PRN
  Administered 2016-01-08: 2 [drp] via OPHTHALMIC

## 2016-01-08 MED ORDER — MIDAZOLAM HCL 2 MG/2ML IJ SOLN
INTRAMUSCULAR | Status: AC
  Filled 2016-01-08: qty 2

## 2016-01-08 MED ORDER — LIDOCAINE 3.5 % OP GEL OPTIME - NO CHARGE
OPHTHALMIC | Status: DC | PRN
Start: 2016-01-08 — End: 2016-01-08
  Administered 2016-01-08: 1 [drp] via OPHTHALMIC

## 2016-01-08 MED ORDER — PHENYLEPHRINE HCL 2.5 % OP SOLN
1.0000 [drp] | OPHTHALMIC | Status: AC
  Administered 2016-01-08 (×3): 1 [drp] via OPHTHALMIC

## 2016-01-08 MED ORDER — PROVISC 10 MG/ML IO SOLN
INTRAOCULAR | Status: DC | PRN
Start: 2016-01-08 — End: 2016-01-08
  Administered 2016-01-08: 0.85 mL via INTRAOCULAR

## 2016-01-08 MED ORDER — LIDOCAINE HCL 3.5 % OP GEL
1.0000 "application " | Freq: Once | OPHTHALMIC | Status: AC
  Administered 2016-01-08: 1 via OPHTHALMIC

## 2016-01-08 MED ORDER — LACTATED RINGERS IV SOLN
INTRAVENOUS | Status: DC
  Administered 2016-01-08: 09:00:00 via INTRAVENOUS

## 2016-01-08 MED ORDER — TETRACAINE HCL 0.5 % OP SOLN
1.0000 [drp] | OPHTHALMIC | Status: AC
  Administered 2016-01-08 (×3): 1 [drp] via OPHTHALMIC

## 2016-01-08 MED ORDER — FENTANYL CITRATE (PF) 100 MCG/2ML IJ SOLN
25.0000 ug | INTRAMUSCULAR | Status: AC
  Administered 2016-01-08 (×2): 25 ug via INTRAVENOUS

## 2016-01-08 SURGICAL SUPPLY — 12 items
CLOTH BEACON ORANGE TIMEOUT ST (SAFETY) ×2 IMPLANT
EYE SHIELD UNIVERSAL CLEAR (GAUZE/BANDAGES/DRESSINGS) ×2 IMPLANT
GLOVE BIOGEL PI IND STRL 7.0 (GLOVE) ×1 IMPLANT
GLOVE BIOGEL PI INDICATOR 7.0 (GLOVE) ×1
GLOVE EXAM NITRILE MD LF STRL (GLOVE) ×2 IMPLANT
PAD ARMBOARD 7.5X6 YLW CONV (MISCELLANEOUS) ×2 IMPLANT
RING MALYGIN (MISCELLANEOUS) ×2 IMPLANT
SIGHTPATH CAT PROC W REG LENS (Ophthalmic Related) ×2 IMPLANT
SYRINGE LUER LOK 1CC (MISCELLANEOUS) ×2 IMPLANT
TAPE SURG TRANSPORE 1 IN (GAUZE/BANDAGES/DRESSINGS) ×1 IMPLANT
TAPE SURGICAL TRANSPORE 1 IN (GAUZE/BANDAGES/DRESSINGS) ×1
WATER STERILE IRR 250ML POUR (IV SOLUTION) ×2 IMPLANT

## 2016-01-08 NOTE — H&P (Signed)
I have reviewed the H&P, the patient was re-examined, and I have identified no interval changes in medical condition and plan of care since the history and physical of record  

## 2016-01-08 NOTE — Anesthesia Postprocedure Evaluation (Signed)
  Anesthesia Post-op Note  Patient: Benjamin [name]  Procedure(s) Performed: Procedure(s) (LRB): CATARACT EXTRACTION PHACO AND INTRAOCULAR LENS PLACEMENT (IOC) (Left)  Patient Location:  Short Stay  Anesthesia Type: MAC  Level of Consciousness: awake  Airway and Oxygen Therapy: Patient Spontanous Breathing  Post-op Pain: none  Post-op Assessment: Post-op Vital signs reviewed, Patient's Cardiovascular Status Stable, Respiratory Function Stable, Patent Airway, No signs of Nausea or vomiting and Pain level controlled  Post-op Vital Signs: Reviewed and stable  Complications: No apparent anesthesia complications

## 2016-01-08 NOTE — Op Note (Signed)
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  Assistants: None  Anesthesia: Topical with MAC  Indications: Painless, progressive loss of vision with compromise of daily activities.  Surgery: Cataract Extraction with Intraocular lens Implant Left Eye, CPT Code [id number]  Discription: The patient had dilating drops and viscous lidocaine placed into the left eye in the pre-op holding area. After transfer to the operating room, a time out was performed. The patient was then prepped and draped. Beginning with a 1 degree blade a paracentesis port was made at the surgeon's 2 o'clock position. The anterior chamber was then filled with 1% non-preserved lidocaine with epinepherine. This was followed by filling the anterior chamber with Provisc. A Malyugan ring was placed into the anterior chamber using its injector. The loops were positioned with the Kuglan hook. A bent cystatome needle was used to create a continuous tear capsulotomy. Hydrodissection was performed with balanced salt solution on a Fine canula. The lens nucleus was then removed using the phacoemulsification handpiece. Residual cortex was removed with the I&A handpiece. The anterior chamber and capsular bag were refilled with Provisc. A posterior chamber intraocular lens was placed into the capsular bag with it's injector. The implant was positioned with the Kuglan hook. The Malyugan ring was disengaged from the iris margin and removed with its injector. The Provisc was then removed from the anterior chamber and capsular bag with the I&A handpiece. Stromal hydration of the main incision and paracentesis port was performed with BSS on a Fine canula. The wounds were tested for leak which was negative. The patient tolerated the procedure well. There were  no operative complications. The patient was then transferred to the recovery room in stable condition.  Complications: None  Specimen: None  EBL: None  Prosthetic device: Hoya Model 250, power 22.0, SN [id number].

## 2016-01-08 NOTE — Discharge Instructions (Signed)
Anesthesia, Adult, Care After °Refer to this sheet in the next few weeks. These instructions provide you with information on caring for yourself after your procedure. Your health care provider may also give you more specific instructions. Your treatment has been planned according to current medical practices, but problems sometimes occur. Call your health care provider if you have any problems or questions after your procedure. °WHAT TO EXPECT AFTER THE PROCEDURE °After the procedure, it is typical to experience: °· Sleepiness. °· Nausea and vomiting. °HOME CARE INSTRUCTIONS °· For the first 24 hours after general anesthesia: °¨ Have a responsible person with you. °¨ Do not drive a car. If you are alone, do not take public transportation. °¨ Do not drink alcohol. °¨ Do not take medicine that has not been prescribed by your health care provider. °¨ Do not sign important papers or make important decisions. °¨ You may resume a normal diet and activities as directed by your health care provider. °· Change bandages (dressings) as directed. °· If you have questions or problems that seem related to general anesthesia, call the hospital and ask for the anesthetist or anesthesiologist on call. °SEEK MEDICAL CARE IF: °· You have nausea and vomiting that continue the day after anesthesia. °· You develop a rash. °SEEK IMMEDIATE MEDICAL CARE IF:  °· You have difficulty breathing. °· You have chest pain. °· You have any allergic problems. °  °This information is not intended to replace advice given to you by your health care provider. Make sure you discuss any questions you have with your health care provider. °  °Document Released: 12/09/2000 Document Revised: 09/23/2014 Document Reviewed: 01/01/2012 °Elsevier Interactive Patient Education ©2016 [employer] ° °

## 2016-01-08 NOTE — Anesthesia Procedure Notes (Signed)
Procedure Name: MAC Date/Time: 01/08/2016 9:26 AM Performed by: Vista Deck Pre-anesthesia Checklist: Patient identified, Emergency Drugs available, Suction available, Timeout performed and Patient being monitored Patient Re-evaluated:Patient Re-evaluated prior to inductionOxygen Delivery Method: Nasal Cannula

## 2016-01-08 NOTE — Anesthesia Preprocedure Evaluation (Signed)
Anesthesia Evaluation  Patient identified by MRN, date of birth, ID band Patient awake    Reviewed: Allergy & Precautions, H&P , NPO status , Patient's Chart, lab work & pertinent test results  Airway Mallampati: II  TM Distance: >3 FB     Dental  (+) Edentulous Upper, Edentulous Lower   Pulmonary COPD, former smoker,    breath sounds clear to auscultation       Cardiovascular negative cardio ROS   Rhythm:Regular Rate:Normal     Neuro/Psych  Headaches,    GI/Hepatic negative GI ROS, GERD  Medicated and Controlled,  Endo/Other    Renal/GU      Musculoskeletal   Abdominal   Peds  Hematology   Anesthesia Other Findings   Reproductive/Obstetrics                             Anesthesia Physical Anesthesia Plan  ASA: III  Anesthesia Plan: MAC   Post-op Pain Management:    Induction: Intravenous  Airway Management Planned: Nasal Cannula  Additional Equipment:   Intra-op Plan:   Post-operative Plan:   Informed Consent: I have reviewed the patients History and Physical, chart, labs and discussed the procedure including the risks, benefits and alternatives for the proposed anesthesia with the patient or authorized representative who has indicated his/her understanding and acceptance.     Plan Discussed with:   Anesthesia Plan Comments:         Anesthesia Quick Evaluation

## 2016-01-08 NOTE — Transfer of Care (Signed)
Immediate Anesthesia Transfer of Care Note  Patient: Benjamin [name]  Procedure(s) Performed: Procedure(s) (LRB): CATARACT EXTRACTION PHACO AND INTRAOCULAR LENS PLACEMENT (IOC) (Left)  Patient Location: Shortstay  Anesthesia Type: MAC  Level of Consciousness: awake  Airway & Oxygen Therapy: Patient Spontanous Breathing   Post-op Assessment: Report given to PACU RN, Post -op Vital signs reviewed and stable and Patient moving all extremities  Post vital signs: Reviewed and stable  Complications: No apparent anesthesia complications

## 2016-01-09 ENCOUNTER — Ambulatory Visit (HOSPITAL_COMMUNITY): Payer: Medicare Other

## 2016-01-09 ENCOUNTER — Telehealth (HOSPITAL_COMMUNITY): Payer: Self-pay

## 2016-01-09 ENCOUNTER — Encounter (HOSPITAL_COMMUNITY): Payer: Self-pay | Admitting: Ophthalmology

## 2016-01-09 NOTE — Telephone Encounter (Signed)
01/09/16 patient cx - he had cataract surgery on 4/24 and really doesn't feel like coming today

## 2016-01-10 ENCOUNTER — Encounter (HOSPITAL_COMMUNITY): Payer: Self-pay | Admitting: Physical Therapy

## 2016-01-11 ENCOUNTER — Ambulatory Visit (HOSPITAL_COMMUNITY): Payer: Medicare Other | Admitting: Physical Therapy

## 2016-01-11 DIAGNOSIS — R600 Localized edema: Secondary | ICD-10-CM | POA: Diagnosis not present

## 2016-01-11 DIAGNOSIS — R2681 Unsteadiness on feet: Secondary | ICD-10-CM | POA: Diagnosis not present

## 2016-01-11 DIAGNOSIS — M25562 Pain in left knee: Secondary | ICD-10-CM | POA: Diagnosis not present

## 2016-01-11 DIAGNOSIS — R29898 Other symptoms and signs involving the musculoskeletal system: Secondary | ICD-10-CM | POA: Diagnosis not present

## 2016-01-11 DIAGNOSIS — R2689 Other abnormalities of gait and mobility: Secondary | ICD-10-CM
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
## 2016-01-12 ENCOUNTER — Ambulatory Visit (HOSPITAL_COMMUNITY): Payer: Self-pay | Admitting: Physical Therapy

## 2016-01-15 DIAGNOSIS — Z8719 Personal history of other diseases of the digestive system: Secondary | ICD-10-CM

## 2016-01-15 HISTORY — DX: Personal history of other diseases of the digestive system: Z87.19

## 2016-01-16 ENCOUNTER — Telehealth (HOSPITAL_COMMUNITY): Payer: Self-pay | Admitting: Physical Therapy

## 2016-01-16 ENCOUNTER — Ambulatory Visit (HOSPITAL_COMMUNITY): Payer: Medicare Other | Attending: Orthopedic Surgery | Admitting: Physical Therapy

## 2016-01-16 DIAGNOSIS — R600 Localized edema: Secondary | ICD-10-CM | POA: Insufficient documentation

## 2016-01-16 DIAGNOSIS — R29898 Other symptoms and signs involving the musculoskeletal system: Secondary | ICD-10-CM | POA: Insufficient documentation

## 2016-01-16 DIAGNOSIS — R2689 Other abnormalities of gait and mobility: Secondary | ICD-10-CM | POA: Insufficient documentation

## 2016-01-16 DIAGNOSIS — R2681 Unsteadiness on feet: Secondary | ICD-10-CM | POA: Insufficient documentation

## 2016-01-16 DIAGNOSIS — M25562 Pain in left knee: Secondary | ICD-10-CM | POA: Insufficient documentation

## 2016-01-16 NOTE — Telephone Encounter (Signed)
Spoke with pt about missed apt today. Pt states he canceled today's apt due to a procedure he has on 01/19/16. Confirmed he will be at his next apt (01/23/16).   4:19 PM,01/16/2016 [place] [phone]

## 2016-01-18 ENCOUNTER — Encounter (HOSPITAL_COMMUNITY): Payer: Self-pay

## 2016-01-19 ENCOUNTER — Encounter (HOSPITAL_COMMUNITY): Payer: Self-pay | Admitting: *Deleted

## 2016-01-19 ENCOUNTER — Ambulatory Visit (HOSPITAL_COMMUNITY)
Admission: RE | Admit: 2016-01-19 | Discharge: 2016-01-19 | Disposition: A | Discharge status: Home / Self Care | Payer: Medicare Other | Source: Ambulatory Visit | Attending: Gastroenterology | Admitting: Gastroenterology

## 2016-01-19 ENCOUNTER — Encounter (HOSPITAL_COMMUNITY)
Admission: RE | Disposition: A | Discharge status: Home / Self Care | Payer: Self-pay | Source: Ambulatory Visit | Attending: Gastroenterology

## 2016-01-19 DIAGNOSIS — Z79899 Other long term (current) drug therapy: Secondary | ICD-10-CM | POA: Diagnosis not present

## 2016-01-19 DIAGNOSIS — K5730 Diverticulosis of large intestine without perforation or abscess without bleeding: Secondary | ICD-10-CM | POA: Diagnosis not present

## 2016-01-19 DIAGNOSIS — K209 Esophagitis, unspecified: Secondary | ICD-10-CM | POA: Diagnosis not present

## 2016-01-19 DIAGNOSIS — K621 Rectal polyp: Secondary | ICD-10-CM | POA: Diagnosis not present

## 2016-01-19 DIAGNOSIS — J449 Chronic obstructive pulmonary disease, unspecified: Secondary | ICD-10-CM | POA: Insufficient documentation

## 2016-01-19 DIAGNOSIS — K648 Other hemorrhoids: Secondary | ICD-10-CM | POA: Insufficient documentation

## 2016-01-19 DIAGNOSIS — Z1211 Encounter for screening for malignant neoplasm of colon: Secondary | ICD-10-CM

## 2016-01-19 DIAGNOSIS — Z7982 Long term (current) use of aspirin: Secondary | ICD-10-CM | POA: Diagnosis not present

## 2016-01-19 DIAGNOSIS — K635 Polyp of colon: Secondary | ICD-10-CM | POA: Diagnosis not present

## 2016-01-19 DIAGNOSIS — M1991 Primary osteoarthritis, unspecified site: Secondary | ICD-10-CM | POA: Insufficient documentation

## 2016-01-19 DIAGNOSIS — K222 Esophageal obstruction: Secondary | ICD-10-CM | POA: Diagnosis not present

## 2016-01-19 DIAGNOSIS — R1013 Epigastric pain: Secondary | ICD-10-CM

## 2016-01-19 DIAGNOSIS — D123 Benign neoplasm of transverse colon: Secondary | ICD-10-CM | POA: Insufficient documentation

## 2016-01-19 DIAGNOSIS — K210 Gastro-esophageal reflux disease with esophagitis: Secondary | ICD-10-CM | POA: Insufficient documentation

## 2016-01-19 DIAGNOSIS — R1011 Right upper quadrant pain: Secondary | ICD-10-CM | POA: Diagnosis not present

## 2016-01-19 DIAGNOSIS — N400 Enlarged prostate without lower urinary tract symptoms: Secondary | ICD-10-CM | POA: Insufficient documentation

## 2016-01-19 DIAGNOSIS — K269 Duodenal ulcer, unspecified as acute or chronic, without hemorrhage or perforation: Secondary | ICD-10-CM | POA: Diagnosis not present

## 2016-01-19 DIAGNOSIS — K644 Residual hemorrhoidal skin tags: Secondary | ICD-10-CM | POA: Diagnosis not present

## 2016-01-19 DIAGNOSIS — K2980 Duodenitis without bleeding: Secondary | ICD-10-CM | POA: Insufficient documentation

## 2016-01-19 DIAGNOSIS — K2970 Gastritis, unspecified, without bleeding: Secondary | ICD-10-CM | POA: Diagnosis not present

## 2016-01-19 DIAGNOSIS — Z87891 Personal history of nicotine dependence: Secondary | ICD-10-CM | POA: Insufficient documentation

## 2016-01-19 DIAGNOSIS — K2950 Unspecified chronic gastritis without bleeding: Secondary | ICD-10-CM | POA: Diagnosis not present

## 2016-01-19 DIAGNOSIS — K449 Diaphragmatic hernia without obstruction or gangrene: Secondary | ICD-10-CM | POA: Diagnosis not present

## 2016-01-19 HISTORY — PX: POLYPECTOMY: SHX5525

## 2016-01-19 HISTORY — PX: COLONOSCOPY: SHX5424

## 2016-01-19 HISTORY — PX: ESOPHAGOGASTRODUODENOSCOPY: SHX5428

## 2016-01-19 HISTORY — PX: BIOPSY: SHX5522

## 2016-01-19 SURGERY — COLONOSCOPY
Anesthesia: Moderate Sedation

## 2016-01-19 MED ORDER — MIDAZOLAM HCL 5 MG/5ML IJ SOLN
INTRAMUSCULAR | Status: DC | PRN
  Administered 2016-01-19: 1 mg via INTRAVENOUS
  Administered 2016-01-19 (×3): 2 mg via INTRAVENOUS

## 2016-01-19 MED ORDER — LIDOCAINE VISCOUS 2 % MT SOLN
OROMUCOSAL | Status: DC | PRN
  Administered 2016-01-19: 5 mL via OROMUCOSAL

## 2016-01-19 MED ORDER — LIDOCAINE VISCOUS 2 % MT SOLN
OROMUCOSAL | Status: AC
  Filled 2016-01-19: qty 15

## 2016-01-19 MED ORDER — PANTOPRAZOLE SODIUM 40 MG PO TBEC: DELAYED_RELEASE_TABLET | ORAL | Status: DC

## 2016-01-19 MED ORDER — STERILE WATER FOR IRRIGATION IR SOLN
Status: DC | PRN
  Administered 2016-01-19: 14:00:00

## 2016-01-19 MED ORDER — MEPERIDINE HCL 100 MG/ML IJ SOLN
INTRAMUSCULAR | Status: DC | PRN
  Administered 2016-01-19 (×4): 25 mg via INTRAVENOUS

## 2016-01-19 MED ORDER — MEPERIDINE HCL 100 MG/ML IJ SOLN
INTRAMUSCULAR | Status: AC
Start: 2016-01-19 — End: 2016-01-20
  Filled 2016-01-19: qty 2

## 2016-01-19 MED ORDER — MIDAZOLAM HCL 5 MG/5ML IJ SOLN
INTRAMUSCULAR | Status: AC
  Filled 2016-01-19: qty 10

## 2016-01-19 MED ORDER — SODIUM CHLORIDE 0.9 % IV SOLN
INTRAVENOUS | Status: DC
  Administered 2016-01-19: 13:00:00 via INTRAVENOUS
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
## 2016-01-19 NOTE — Op Note (Addendum)
[facility] Patient Name: Benjamin [name] Procedure Date: 01/19/2016 1:14 PM MRN: [id number] Date of Birth: Sep 01, 1941 Attending MD: Barney Drain , MD CSN: [id number] Age: 75 Admit Type: Outpatient Procedure:                Colonoscopy Indications:              Screening for colorectal malignant neoplasm Providers:                Barney Drain, MD, Renda Rolls, RN, Isabella Stalling,                            Technician Referring MD:             Modena Nunnery. [place] Medicines:                Meperidine 75 mg IV, Midazolam 6 mg IV Complications:            No immediate complications. Estimated Blood Loss:     Estimated blood loss was minimal. Procedure:                Pre-Anesthesia Assessment:                           - Prior to the procedure, a History and Physical                            was performed, and patient medications and                            allergies were reviewed. The patient's tolerance of                            previous anesthesia was also reviewed. The risks                            and benefits of the procedure and the sedation                            options and risks were discussed with the patient.                            All questions were answered, and informed consent                            was obtained. Prior Anticoagulants: The patient has                            taken aspirin, last dose was day of procedure. ASA                            Grade Assessment: II - A patient with mild systemic                            disease. After reviewing the risks and benefits,  the patient was deemed in satisfactory condition to                            undergo the procedure.                           After obtaining informed consent, the colonoscope                            was passed under direct vision. Throughout the                            procedure, the patient's blood pressure, pulse, and      oxygen saturations were monitored continuously. The                            EC-[id number]) scope was introduced through                            the anus and advanced to the the cecum, identified                            by appendiceal orifice and ileocecal valve. The                            ileocecal valve, appendiceal orifice, and rectum                            were photographed. The colonoscopy was somewhat                            difficult due to a tortuous colon. Successful                            completion of the procedure was aided by using                            manual pressure. The patient tolerated the                            procedure well. The quality of the bowel                            preparation was good. Scope In: 1:48:41 PM Scope Out: 2:17:27 PM Scope Withdrawal Time: 0 hours 24 minutes 50 seconds  Total Procedure Duration: 0 hours 28 minutes 46 seconds  Findings:      The digital rectal exam findings include non-thrombosed external       hemorrhoids.      A 10 mm polyp was found in the distal transverse colon. The polyp was       sessile. The polyp was removed with a hot snare. Resection and retrieval       were complete.      A 6 mm polyp was found in the sigmoid colon. The polyp was sessile. The  polyp was removed with a hot snare. Resection and retrieval were       complete.      Multiple small and large-mouthed diverticula were found in the entire       colon.      Non-bleeding internal hemorrhoids were found. Impression:               - Non-thrombosed external hemorrhoids found on                            digital rectal exam.                           - One 10 mm polyp in the distal transverse colon,                            removed with a hot snare. Resected and retrieved.                            TWO CLIPS PLACED TO PREVENT POSTPOLYPECTOMY BLEED.                           - One 6 mm polyp in the sigmoid colon,  removed with                            a hot snare. Resected and retrieved.                           - Diverticulosis in the entire examined colon.                           - Non-bleeding internal hemorrhoids. Moderate Sedation:      Moderate (conscious) sedation was administered by the endoscopy nurse       and supervised by the endoscopist. The following parameters were       monitored: oxygen saturation, heart rate, blood pressure, and response       to care. Total physician intraservice time was 57 minutes. Recommendation:           - Patient has a contact number available for                            emergencies. The signs and symptoms of potential                            delayed complications were discussed with the                            patient. Return to normal activities tomorrow.                            Written discharge instructions were provided to the                            patient.                           -  Resume previous diet.                           - Continue present medications.                           - Await pathology results.                           - Repeat colonoscopy in 3 - 5 years for                            surveillance. Procedure Code(s):        --- Professional ---                           [phone], Colonoscopy, flexible; with removal of                            tumor(s), polyp(s), or other lesion(s) by snare                            technique                           99153, Moderate sedation services; each additional                            15 minutes intraservice time                           99153, Moderate sedation services; each additional                            15 minutes intraservice time                           99153, Moderate sedation services; each additional                            15 minutes intraservice time                           G0500, Moderate sedation services provided by the                             same physician or other qualified health care                            professional performing a gastrointestinal                            endoscopic service that sedation supports,                            requiring the presence of an independent trained  observer to assist in the monitoring of the                            patient's level of consciousness and physiological                            status; initial 15 minutes of intra-service time;                            patient age 24 years or older (additional time may                            be reported with [phone], as appropriate) Diagnosis Code(s):        --- Professional ---                           Z12.11, Encounter for screening for malignant                            neoplasm of colon                           D12.3, Benign neoplasm of transverse colon (hepatic                            flexure or splenic flexure)                           D12.5, Benign neoplasm of sigmoid colon                           K64.4, Residual hemorrhoidal skin tags                           K64.8, Other hemorrhoids                           K57.30, Diverticulosis of large intestine without                            perforation or abscess without bleeding CPT copyright 2016 [employer]. All rights reserved. The codes documented in this report are preliminary and upon coder review may  be revised to meet current compliance requirements. Barney Drain, MD Barney Drain, MD 01/19/2016 4:31:41 PM This report has been signed electronically. Number of Addenda: 0

## 2016-01-19 NOTE — Progress Notes (Signed)
REVIEWED-NO ADDITIONAL RECOMMENDATIONS. 

## 2016-01-19 NOTE — Discharge Instructions (Signed)
THE NAUSEA/VOMITING, AND UPPER ABDOMNAL PAIN ARE DUE TO ESOPHAGITIS, GASTRITIS/DUODENITIS, AND ULCERS IN THE SMALL BOWEL. NO OBVIOUS SOURCE FOR YOUR LOWER ABDOMINAL PAIN WAS IDENTIFIED. I WAS UNABLE TO LOOK AT THE LAST PART OF YOUR SMALL INTESETINES. YOU HAVE A FLOPPY LEFT COLON.  You had 3 polyps removed. ONE WAS LARGE AND I PLACED A METAL CLIP TO PREVENT BLEEDING IN 7-10 DAYS. 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 You have ESOPHAGITIS DUE TO ACID REFLUX, AN ESOPHAGEAL STRICTURE, MODERATE gastritis/DUODENITIS AND DUODENAL ULCERS FROM ASPIRIN, & a MODERATE SIZE HIATAL HERNIA.  I biopsied your stomach.     NO MRI FOR 30 DAYS DUE TO METAL CLIP PLACEMENT IN THE COLON.  STOP ASPIRIN FOR ONE MONTH.  DRINK WATER TO KEEP YOUR URINE LIGHT YELLOW.  FOLLOW A HIGH FIBER/LOW FAT DIET. AVOID ITEMS THAT CAUSE BLOATING. SEE INFO BELOW.  FOLLOW RECOMMENDATIONS FOR CONTROLLING REFLUX. SEE INFO BELOW.  CONTINUE PROTONIX. INCREASE TO 30 MINUTES PRIOR TO MEALS TWICE DAILY.  AVOID ITEMS THAT TRIGGER GASTRITIS. SEE INFO BELOW.  YOUR BIOPSY RESULTS WILL BE AVAILABLE IN MY CHART MAY 9 AND MY OFFICE WILL CONTACT YOU IN 10-14 DAYS WITH YOUR RESULTS.   Next colonoscopy in 3-5 years.    ENDOSCOPY Care After Read the instructions outlined below and refer to this sheet in the next week. These discharge instructions provide you with general information on caring for yourself after you leave the hospital. While your treatment has been planned according to the most current medical practices available, unavoidable complications occasionally occur. If you have any problems or questions after discharge, call DR. Kenise Barraco, [phone].  ACTIVITY  You may resume your regular activity, but move at a slower pace for the next 24 hours.   Take frequent rest periods for the next 24 hours.   Walking will help get rid of the air and reduce the bloated feeling in your belly (abdomen).    No driving for 24 hours (because of the medicine (anesthesia) used during the test).   You may shower.   Do not sign any important legal documents or operate any machinery for 24 hours (because of the anesthesia used during the test).    NUTRITION  Drink plenty of fluids.   You may resume your normal diet as instructed by your doctor.   Begin with a light meal and progress to your normal diet. Heavy or fried foods are harder to digest and may make you feel sick to your stomach (nauseated).   Avoid alcoholic beverages for 24 hours or as instructed.    MEDICATIONS  You may resume your normal medications.   WHAT YOU CAN EXPECT TODAY  Some feelings of bloating in the abdomen.   Passage of more gas than usual.   Spotting of blood in your stool or on the toilet paper  .  IF YOU HAD POLYPS REMOVED DURING THE ENDOSCOPY:  Eat a soft diet IF YOU HAVE NAUSEA, BLOATING, ABDOMINAL PAIN, OR VOMITING.    FINDING OUT THE RESULTS OF YOUR TEST Not all test results are available during your visit. DR. Oneida Alar WILL CALL YOU WITHIN 14 DAYS OF YOUR PROCEDUE WITH YOUR RESULTS. Do not assume everything is normal if you have not heard from DR. Odeal Welden, CALL HER OFFICE AT [phone].  SEEK IMMEDIATE MEDICAL ATTENTION AND CALL THE OFFICE: [phone] IF:  You have more than a spotting of blood in your stool.   Your belly is swollen (abdominal distention).  You are nauseated or vomiting.   You have a temperature over 101F.   You have abdominal pain or discomfort that is severe or gets worse throughout the day.    Lifestyle and home remedies TO HELP CONTROL REFLUX  You may eliminate or reduce the frequency of heartburn by making the following lifestyle changes:   Control your weight. Being overweight is a major risk factor for heartburn and GERD. Excess pounds put pressure on your abdomen, pushing up your stomach and causing acid to back up into your esophagus.    Eat smaller  meals. 4 TO 6 MEALS A DAY. This reduces pressure on the lower esophageal sphincter, helping to prevent the valve from opening and acid from washing back into your esophagus.    Loosen your belt. Clothes that fit tightly around your waist put pressure on your abdomen and the lower esophageal sphincter.    Eliminate heartburn triggers. Everyone has specific triggers. Common triggers such as fatty or fried foods, spicy food, tomato sauce, carbonated beverages, alcohol, chocolate, mint, garlic, onion, caffeine and nicotine may make heartburn worse.    Avoid stooping or bending. Tying your shoes is OK. Bending over for longer periods to weed your garden isn't, especially soon after eating.    Don't lie down after a meal. Wait at least three to four hours after eating before going to bed, and don't lie down right after eating.    Alternative medicine  Several home remedies exist for treating GERD, but they provide only temporary relief. They include drinking baking soda (sodium bicarbonate) added to water or drinking other fluids such as baking soda mixed with cream of tartar and water.   Although these liquids create temporary relief by neutralizing, washing away or buffering acids, eventually they aggravate the situation by adding gas and fluid to your stomach, increasing pressure and causing more acid reflux. Further, adding more sodium to your diet may increase your blood pressure and add stress to your heart, and excessive bicarbonate ingestion can alter the acid-base balance in your body.  Gastritis  Gastritis is an inflammation (the body's way of reacting to injury and/or infection) of the stomach. It is often caused by viral or bacterial (germ) infections. It can also be caused BY ASPIRIN, BC/GOODY POWDER'S, (IBUPROFEN) MOTRIN, OR ALEVE (NAPROXEN), chemicals (including alcohol), SPICY FOODS, and medications. This illness may be associated with generalized malaise (feeling tired, not  well), UPPER ABDOMINAL STOMACH cramps, and fever. One common bacterial cause of gastritis is an organism known as H. Pylori. This can be treated with antibiotics.    Hiatal Hernia A hiatal hernia occurs when a part of the stomach slides above the diaphragm. The diaphragm is the thin muscle separating the belly (abdomen) from the chest. A hiatal hernia can be something you are born with or develop over time. Hiatal hernias may allow stomach acid to flow back into your esophagus, the tube which carries food from your mouth to your stomach. If this acid causes problems it is called GERD (gastro-esophageal reflux disease).   SYMPTOMS Common symptoms of GERD are heartburn (burning in your chest). This is worse when lying down or bending over. It may also cause belching and indigestion. Some of the things which make GERD worse are:  Increased weight pushes on stomach making acid rise more easily.   Smoking markedly increases acid production.   Alcohol decreases lower esophageal sphincter pressure (valve between stomach and esophagus), allowing acid from stomach into esophagus.   Late evening  meals and going to bed with a full stomach increases pressure.   HOME CARE INSTRUCTIONS  Try to achieve and maintain an ideal body weight.   Avoid drinking alcoholic beverages.   DO NOT smokE.   Do not wear tight clothing around your chest or stomach.   Eat smaller meals and eat more frequently. This keeps your stomach from getting too full. Eat slowly.   Do not lie down for 2 or 3 hours after eating. Do not eat or drink anything 1 to 2 hours before going to bed.   Avoid caffeine beverages (colas, coffee, cocoa, tea), fatty foods, citrus fruits and all other foods and drinks that contain acid and that seem to increase the problems.   Avoid bending over, especially after eating OR STRAINING. Anything that increases the pressure in your belly increases the amount of acid that may be pushed up into your  esophagus.    High-Fiber Diet A high-fiber diet changes your normal diet to include more whole grains, legumes, fruits, and vegetables. Changes in the diet involve replacing refined carbohydrates with unrefined foods. The calorie level of the diet is essentially unchanged. The Dietary Reference Intake (recommended amount) for adult males is 38 grams per day. For adult females, it is 25 grams per day. Pregnant and lactating women should consume 28 grams of fiber per day. Fiber is the intact part of a plant that is not broken down during digestion. Functional fiber is fiber that has been isolated from the plant to provide a beneficial effect in the body. PURPOSE  Increase stool bulk.   Ease and regulate bowel movements.   Lower cholesterol.  REDUCE RISK OF COLON CANCER  INDICATIONS THAT YOU NEED MORE FIBER  Constipation and hemorrhoids.   Uncomplicated diverticulosis (intestine condition) and irritable bowel syndrome.   Weight management.   As a protective measure against hardening of the arteries (atherosclerosis), diabetes, and cancer.   GUIDELINES FOR INCREASING FIBER IN THE DIET  Start adding fiber to the diet slowly. A gradual increase of about 5 more grams (2 slices of whole-wheat bread, 2 servings of most fruits or vegetables, or 1 bowl of high-fiber cereal) per day is best. Too rapid an increase in fiber may result in constipation, flatulence, and bloating.   Drink enough water and fluids to keep your urine clear or pale yellow. Water, juice, or caffeine-free drinks are recommended. Not drinking enough fluid may cause constipation.   Eat a variety of high-fiber foods rather than one type of fiber.   Try to increase your intake of fiber through using high-fiber foods rather than fiber pills or supplements that contain small amounts of fiber.   The goal is to change the types of food eaten. Do not supplement your present diet with high-fiber foods, but replace foods in your  present diet.   INCLUDE A VARIETY OF FIBER SOURCES  Replace refined and processed grains with whole grains, canned fruits with fresh fruits, and incorporate other fiber sources. White rice, white breads, and most bakery goods contain little or no fiber.   Brown whole-grain rice, buckwheat oats, and many fruits and vegetables are all good sources of fiber. These include: broccoli, Brussels sprouts, cabbage, cauliflower, beets, sweet potatoes, white potatoes (skin on), carrots, tomatoes, eggplant, squash, berries, fresh fruits, and dried fruits.   Cereals appear to be the richest source of fiber. Cereal fiber is found in whole grains and bran. Bran is the fiber-rich outer coat of cereal grain, which is largely removed  in refining. In whole-grain cereals, the bran remains. In breakfast cereals, the largest amount of fiber is found in those with "bran" in their names. The fiber content is sometimes indicated on the label.   You may need to include additional fruits and vegetables each day.   In baking, for 1 cup white flour, you may use the following substitutions:   1 cup whole-wheat flour minus 2 tablespoons.   1/2 cup white flour plus 1/2 cup whole-wheat flour.   Low-Fat Diet BREADS, CEREALS, PASTA, RICE, DRIED PEAS, AND BEANS These products are high in carbohydrates and most are low in fat. Therefore, they can be increased in the diet as substitutes for fatty foods. They too, however, contain calories and should not be eaten in excess. Cereals can be eaten for snacks as well as for breakfast.  Include foods that contain fiber (fruits, vegetables, whole grains, and legumes). Research shows that fiber may lower blood cholesterol levels, especially the water-soluble fiber found in fruits, vegetables, oat products, and legumes. FRUITS AND VEGETABLES It is good to eat fruits and vegetables. Besides being sources of fiber, both are rich in vitamins and some minerals. They help you get the daily  allowances of these nutrients. Fruits and vegetables can be used for snacks and desserts. MEATS Limit lean meat, chicken, [place], and fish to no more than 6 ounces per day. Beef, Pork, and Lamb Use lean cuts of beef, pork, and lamb. Lean cuts include:  Extra-lean ground beef.  Arm roast.  Sirloin tip.  Center-cut ham.  Round steak.  Loin chops.  Rump roast.  Tenderloin.  Trim all fat off the outside of meats before cooking. It is not necessary to severely decrease the intake of red meat, but lean choices should be made. Lean meat is rich in protein and contains a highly absorbable form of iron. Premenopausal women, in particular, should avoid reducing lean red meat because this could increase the risk for low red blood cells (iron-deficiency anemia). The organ meats, such as liver, sweetbreads, kidneys, and brain are very rich in cholesterol. They should be limited. Chicken and [place] These are good sources of protein. The fat of poultry can be reduced by removing the skin and underlying fat layers before cooking. Chicken and [place] can be substituted for lean red meat in the diet. Poultry should not be fried or covered with high-fat sauces. Fish and Shellfish Fish is a good source of protein. Shellfish contain cholesterol, but they usually are low in saturated fatty acids. The preparation of fish is important. Like chicken and [place], they should not be fried or covered with high-fat sauces. EGGS Egg whites contain no fat or cholesterol. They can be eaten often. Try 1 to 2 egg whites instead of whole eggs in recipes or use egg substitutes that do not contain yolk. MILK AND DAIRY PRODUCTS Use skim or 1% milk instead of 2% or whole milk. Decrease whole milk, natural, and processed cheeses. Use nonfat or low-fat (2%) cottage cheese or low-fat cheeses made from vegetable oils. Choose nonfat or low-fat (1 to 2%) yogurt. Experiment with evaporated skim milk in recipes that call for heavy cream.  Substitute low-fat yogurt or low-fat cottage cheese for sour cream in dips and salad dressings. Have at least 2 servings of low-fat dairy products, such as 2 glasses of skim (or 1%) milk each day to help get your daily calcium intake.  FATS AND OILS Reduce the total intake of fats, especially saturated fat. Butterfat, lard, and  beef fats are high in saturated fat and cholesterol. These should be avoided as much as possible. Vegetable fats do not contain cholesterol, but certain vegetable fats, such as coconut oil, palm oil, and palm kernel oil are very high in saturated fats. These should be limited. These fats are often used in bakery goods, processed foods, popcorn, oils, and nondairy creamers. Vegetable shortenings and some peanut butters contain hydrogenated oils, which are also saturated fats. Read the labels on these foods and check for saturated vegetable oils. Unsaturated vegetable oils and fats do not raise blood cholesterol. However, they should be limited because they are fats and are high in calories. Total fat should still be limited to 30% of your daily caloric intake. Desirable liquid vegetable oils are corn oil, cottonseed oil, olive oil, canola oil, safflower oil, soybean oil, and sunflower oil. Peanut oil is not as good, but small amounts are acceptable. Buy a heart-healthy tub margarine that has no partially hydrogenated oils in the ingredients. Mayonnaise and salad dressings often are made from unsaturated fats, but they should also be limited because of their high calorie and fat content. Seeds, nuts, peanut butter, olives, and avocados are high in fat, but the fat is mainly the unsaturated type. These foods should be limited mainly to avoid excess calories and fat. OTHER EATING TIPS Snacks  Most sweets should be limited as snacks. They tend to be rich in calories and fats, and their caloric content outweighs their nutritional value. Some good choices in snacks are graham crackers, melba  toast, soda crackers, bagels (no egg), English muffins, fruits, and vegetables. These snacks are preferable to snack crackers, [place] fries, and chips. Popcorn should be air-popped or cooked in small amounts of liquid vegetable oil. Desserts Eat fruit, low-fat yogurt, and fruit ices. AVOID pastries, cake, and cookies. Sherbet, angel food cake, gelatin dessert, frozen low-fat yogurt, or other frozen products that do not contain saturated fat (pure fruit juice bars, frozen ice pops) are also acceptable.  COOKING METHODS Choose those methods that use little or no fat. They include: Poaching.  Braising.  Steaming.  Grilling.  Baking.  Stir-frying.  Broiling.  Microwaving.  Foods can be cooked in a nonstick pan without added fat, or use a nonfat cooking spray in regular cookware. Limit fried foods and avoid frying in saturated fat. Add moisture to lean meats by using water, broth, cooking wines, and other nonfat or low-fat sauces along with the cooking methods mentioned above. Soups and stews should be chilled after cooking. The fat that forms on top after a few hours in the refrigerator should be skimmed off. When preparing meals, avoid using excess salt. Salt can contribute to raising blood pressure in some people. EATING AWAY FROM HOME Order entres, potatoes, and vegetables without sauces or butter. When meat exceeds the size of a deck of cards (3 to 4 ounces), the rest can be taken home for another meal. Choose vegetable or fruit salads and ask for low-calorie salad dressings to be served on the side. Use dressings sparingly. Limit high-fat toppings, such as bacon, crumbled eggs, cheese, sunflower seeds, and olives. Ask for heart-healthy tub margarine instead of butter.  Hemorrhoids Hemorrhoids are dilated (enlarged) veins around the rectum. Sometimes clots will form in the veins. This makes them swollen and painful. These are called thrombosed hemorrhoids. Causes of hemorrhoids  include:  Constipation.   Straining to have a bowel movement.   HEAVY LIFTING  HOME CARE INSTRUCTIONS  Eat a well balanced diet  and drink 6 to 8 glasses of water every day to avoid constipation. You may also use a bulk laxative.   Avoid straining to have bowel movements.   Keep anal area dry and clean.   Do not use a donut shaped pillow or sit on the toilet for long periods. This increases blood pooling and pain.   Move your bowels when your body has the urge; this will require less straining and will decrease pain and pressure.

## 2016-01-19 NOTE — Op Note (Addendum)
[facility] Patient Name: Benjamin [name] Procedure Date: 01/19/2016 2:20 PM MRN: [id number] Date of Birth: [birth year] Attending MD: Barney Drain , MD CSN: [id number] Age: 75 Admit Type: Outpatient Procedure:                Upper GI endoscopy Indications:              Abdominal pain in the right upper quadrant, Nausea                            with vomiting Providers:                Barney Drain, MD, Renda Rolls, RN, Isabella Stalling,                            Technician Referring MD:             Modena Nunnery. [place] Medicines:                Meperidine 25 mg IV, Midazolam 1 mg IV + TCS Complications:            No immediate complications. Estimated Blood Loss:     Estimated blood loss was minimal. Procedure:                Pre-Anesthesia Assessment:                           - Prior to the procedure, a History and Physical                            was performed, and patient medications and                            allergies were reviewed. The patient's tolerance of                            previous anesthesia was also reviewed. The risks                            and benefits of the procedure and the sedation                            options and risks were discussed with the patient.                            All questions were answered, and informed consent                            was obtained. Prior Anticoagulants: The patient has                            taken aspirin, last dose was day of procedure. ASA                            Grade Assessment: II - A patient with mild systemic  disease. After reviewing the risks and benefits,                            the patient was deemed in satisfactory condition to                            undergo the procedure.                           After obtaining informed consent, the endoscope was                            passed under direct vision. Throughout the                            procedure, the  patient's blood pressure, pulse, and                            oxygen saturations were monitored continuously. The                            [id number]) scope was introduced through the                            mouth, and advanced to the second part of duodenum.                            The upper GI endoscopy was accomplished with ease.                            The patient tolerated the procedure well. Scope In: 2:24:07 PM Scope Out: 2:29:26 PM Total Procedure Duration: 0 hours 5 minutes 19 seconds  Findings:      LA Grade B (one or more mucosal breaks greater than 5 mm, not extending       between the tops of two mucosal folds) esophagitis was found.      One stenosis was found. And was traversed.      A small hiatal hernia was present.      Scattered mild inflammation was found in the gastric antrum. Biopsies       were taken with a cold forceps for Helicobacter pylori testing.      Scattered moderate inflammation characterized by congestion (edema),       erosions and erythema was found in the duodenal bulb and in the second       portion of the duodenum.      Few non-bleeding duodenal ulcers were found in the second portion of the       duodenum. Impression:               - LA Grade B esophagitis.                           - Esophageal stenosis/ESOPHAGITIS.                           - Small hiatal hernia.                           -  Gastritis. Biopsied.                           - Duodenitis.                           - Multiple non-bleeding duodenal ulcers. Moderate Sedation:      Moderate (conscious) sedation was administered by the endoscopy nurse       and supervised by the endoscopist. The following parameters were       monitored: oxygen saturation, heart rate, blood pressure, and response       to care. Total physician intraservice time was 53 minutes. Recommendation:           - Patient has a contact number available for                             emergencies. The signs and symptoms of potential                            delayed complications were discussed with the                            patient. Return to normal activities tomorrow.                            Written discharge instructions were provided to the                            patient.                           - Resume previous diet.                           - Continue present medications.                           - Await pathology results.                           HOLD ASA FOR ONE MONTH.                           NO MRI FOR 30 DAYS DUE TO CLIPS IN COLON                           NEXT TCS IN 3-5 YEARS Procedure Code(s):        --- Professional ---                           [phone], Esophagogastroduodenoscopy, flexible,                            transoral; with biopsy, single or multiple                           99153, Moderate sedation services; each additional  15 minutes intraservice time                           99153, Moderate sedation services; each additional                            15 minutes intraservice time                           99153, Moderate sedation services; each additional                            15 minutes intraservice time                           G0500, Moderate sedation services provided by the                            same physician or other qualified health care                            professional performing a gastrointestinal                            endoscopic service that sedation supports,                            requiring the presence of an independent trained                            observer to assist in the monitoring of the                            patient's level of consciousness and physiological                            status; initial 15 minutes of intra-service time;                            patient age 5 years or older (additional time may                             be reported with [phone], as appropriate) Diagnosis Code(s):        --- Professional ---                           K20.9, Esophagitis, unspecified                           K22.2, Esophageal obstruction                           K44.9, Diaphragmatic hernia without obstruction or                            gangrene  K29.70, Gastritis, unspecified, without bleeding                           K29.80, Duodenitis without bleeding                           K26.9, Duodenal ulcer, unspecified as acute or                            chronic, without hemorrhage or perforation                           R10.11, Right upper quadrant pain                           R11.2, Nausea with vomiting, unspecified CPT copyright 2016 American Medical Association. All rights reserved. The codes documented in this report are preliminary and upon coder review may  be revised to meet current compliance requirements. Barney Drain, MD Barney Drain, MD 01/19/2016 4:38:23 PM This report has been signed electronically. Number of Addenda: 0

## 2016-01-22 DIAGNOSIS — K2950 Unspecified chronic gastritis without bleeding: Secondary | ICD-10-CM | POA: Diagnosis not present

## 2016-01-22 DIAGNOSIS — D127 Benign neoplasm of rectosigmoid junction: Secondary | ICD-10-CM | POA: Diagnosis not present

## 2016-01-22 DIAGNOSIS — D123 Benign neoplasm of transverse colon: Secondary | ICD-10-CM | POA: Diagnosis not present

## 2016-01-23 ENCOUNTER — Ambulatory Visit (HOSPITAL_COMMUNITY): Payer: Medicare Other | Admitting: Physical Therapy

## 2016-01-23 DIAGNOSIS — M25562 Pain in left knee: Secondary | ICD-10-CM | POA: Diagnosis not present

## 2016-01-23 DIAGNOSIS — R29898 Other symptoms and signs involving the musculoskeletal system: Secondary | ICD-10-CM | POA: Diagnosis not present

## 2016-01-23 DIAGNOSIS — R2689 Other abnormalities of gait and mobility: Secondary | ICD-10-CM | POA: Diagnosis not present

## 2016-01-23 DIAGNOSIS — R600 Localized edema: Secondary | ICD-10-CM

## 2016-01-23 DIAGNOSIS — R2681 Unsteadiness on feet: Secondary | ICD-10-CM
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
## 2016-01-23 NOTE — Patient Instructions (Signed)
SINGLE LEG STANCE - SLS  Stand on one leg and maintain your balance.       Hold as long as possible     SINGLE LEG BRIDGE - MODIFIED  While lying on your back, raise your buttocks off the floor/bed into a bridge position.    Next straighten a leg so that only one leg is supporting your body. Then, return that leg back to the ground and change to the other side.      Try and maintain your pelvis level the entire time.    x10 each    SQUATS - MINI SQUAT - TABLE  While standing with feet shoulder width apart and in front of a stable support for balance assist if needed, bend your knees and lower your body towards the floor. Your body weight should mostly be directed through the heels of your feet. Return to a standing position.   Knees should bend in line with the 2nd toe and not pass the front of the foot.    2x15. Slow and controlled when going down.    LAQ/ Long Arc Quad/ Knee Extension with Band  Sit upright at the edge of a seat with foot looped in elastic band. With control, kick the foot out and up to straighten the knee.   Your knee should be pointed towards the ceiling and in line with your foot. Your foot should be flexed at the ankle and the sole of your foot should not turn in or out. Return to starting position with the same level of control.  2x15

## 2016-01-25 ENCOUNTER — Encounter (HOSPITAL_COMMUNITY): Payer: Self-pay | Admitting: Physical Therapy

## 2016-01-25 ENCOUNTER — Encounter (HOSPITAL_COMMUNITY): Payer: Self-pay | Admitting: Gastroenterology

## 2016-01-26 DIAGNOSIS — M2392 Unspecified internal derangement of left knee: Secondary | ICD-10-CM | POA: Diagnosis not present

## 2016-01-30 ENCOUNTER — Encounter (HOSPITAL_COMMUNITY): Payer: Self-pay | Admitting: Physical Therapy

## 2016-02-01 ENCOUNTER — Encounter (HOSPITAL_COMMUNITY): Payer: Self-pay | Admitting: Physical Therapy

## 2016-02-04 ENCOUNTER — Telehealth: Payer: Self-pay | Admitting: Gastroenterology

## 2016-02-04 DIAGNOSIS — K269 Duodenal ulcer, unspecified as acute or chronic, without hemorrhage or perforation: Secondary | ICD-10-CM

## 2016-02-04 NOTE — Telephone Encounter (Addendum)
THE NAUSEA/VOMITING, AND UPPER ABDOMNAL PAIN ARE DUE TO ESOPHAGITIS, GASTRITIS/DUODENITIS, AND ULCERS IN THE SMALL BOWEL. HE HAD ONE LARGE SIMPLE ADENOMA AND ONE HYPERPLASTIC POLYPS  removed. I PLACED A METAL CLIP TO PREVENT BLEEDING IN 7-10 DAYS. You have MODERATE gastritis/DUODENITIS AND DUODENAL ULCERS FROM ASPIRIN.   NO MRI  UNTIL JUN 8 DUE TO METAL CLIP PLACEMENT IN THE COLON.  STOP ASPIRIN.   DRINK WATER TO KEEP YOUR URINE LIGHT YELLOW.  FOLLOW A HIGH FIBER/LOW FAT DIET. AVOID ITEMS THAT CAUSE BLOATING.   FOLLOW RECOMMENDATIONS FOR CONTROLLING REFLUX.   CONTINUE PROTONIX. INCREASE TO 30 MINUTES PRIOR TO MEALS TWICE DAILY.  FOLLOW UP IN SEP 2017 E30 GERD/GASTRITIS.   Next colonoscopy in 3 years.

## 2016-02-05 ENCOUNTER — Encounter: Payer: Self-pay | Admitting: Gastroenterology

## 2016-02-05 ENCOUNTER — Encounter (HOSPITAL_COMMUNITY): Payer: Self-pay | Admitting: Physical Therapy

## 2016-02-05 NOTE — Telephone Encounter (Signed)
Called pt and LMOM to call office back  

## 2016-02-05 NOTE — Telephone Encounter (Signed)
APPT MADE AND ON RECALL  °

## 2016-02-06 NOTE — Telephone Encounter (Signed)
Pt is aware. Said he was previously taking protonix tid. He is taking bid now, but said it is not helping. Said Zofran is not helping now either.  He has acid come up in his esophagus almost every night. Please advise!

## 2016-02-07 ENCOUNTER — Other Ambulatory Visit: Payer: Self-pay | Admitting: *Deleted

## 2016-02-07 ENCOUNTER — Encounter (HOSPITAL_COMMUNITY): Payer: Self-pay

## 2016-02-07 DIAGNOSIS — R7303 Prediabetes: Secondary | ICD-10-CM

## 2016-02-07 DIAGNOSIS — N400 Enlarged prostate without lower urinary tract symptoms: Secondary | ICD-10-CM

## 2016-02-07 DIAGNOSIS — E785 Hyperlipidemia, unspecified: Secondary | ICD-10-CM

## 2016-02-07 DIAGNOSIS — Z125 Encounter for screening for malignant neoplasm of prostate: Secondary | ICD-10-CM

## 2016-02-07 MED ORDER — LIDOCAINE VISCOUS 2 % MT SOLN: OROMUCOSAL | Status: DC

## 2016-02-07 NOTE — Telephone Encounter (Addendum)
PLEASE CALL PT. HE HAS PAIN BECAUSE HE HAS ULCERS AND INFLAMMATION IN HIS SMALL BOWEL AND STOMACH DUE TO ASPIRIN. HE SHOULD STOP THE ASPIRIN. HE SHOULD STRICTLY AVOID THINGS THAT TRIGGER REFLUX. THE USUAL EFFECTIVE DOSE FOR PROTONIX IS TWICE DAILY. MOST INSURANCE CO WILL NOT PAY FOR TID PROTONIX. IF HIS SYMPTOMS ARE UNCONTROLLED HE NEEDS TO SWITCH TO DEXILANT OR ACIPHEX OR WE CAN ADD A MEDICINE TO TRY TO HELP KEEP HIS ESOPHAGUS CLOSED. HE SHOULD LET ME KNOW WHAT HE WOULD LIKE TO DO. HE SHOULD HAVE A GASTRIN LEVEL DRAWN. HE CAN USE VISCOUS LIDOCAINE IF NEEDED TO CONTROL HIS ABDOMINAL PAIN. RX HAS BEEN SENT.

## 2016-02-07 NOTE — Addendum Note (Signed)
Addended by: [name] on: 02/07/2016 02:48 PM   Modules accepted: Orders

## 2016-02-08 ENCOUNTER — Other Ambulatory Visit: Payer: Self-pay

## 2016-02-08 ENCOUNTER — Other Ambulatory Visit: Payer: Self-pay | Admitting: Family Medicine

## 2016-02-08 DIAGNOSIS — R1013 Epigastric pain: Secondary | ICD-10-CM

## 2016-02-08 DIAGNOSIS — E785 Hyperlipidemia, unspecified: Secondary | ICD-10-CM | POA: Diagnosis not present

## 2016-02-08 DIAGNOSIS — K259 Gastric ulcer, unspecified as acute or chronic, without hemorrhage or perforation: Secondary | ICD-10-CM

## 2016-02-08 DIAGNOSIS — Z125 Encounter for screening for malignant neoplasm of prostate: Secondary | ICD-10-CM | POA: Diagnosis not present

## 2016-02-08 DIAGNOSIS — R7303 Prediabetes: Secondary | ICD-10-CM | POA: Diagnosis not present

## 2016-02-08 DIAGNOSIS — N400 Enlarged prostate without lower urinary tract symptoms: Secondary | ICD-10-CM | POA: Diagnosis not present

## 2016-02-08 LAB — COMPLETE METABOLIC PANEL WITH GFR
ALBUMIN: 3.8 g/dL (ref 3.6–5.1)
ALK PHOS: 50 U/L (ref 40–115)
ALT: 38 U/L (ref 9–46)
AST: 28 U/L (ref 10–35)
BUN: 15 mg/dL (ref 7–25)
CALCIUM: 9 mg/dL (ref 8.6–10.3)
CHLORIDE: 107 mmol/L (ref 98–110)
CO2: 24 mmol/L (ref 20–31)
Creat: 0.96 mg/dL (ref 0.70–1.18)
GFR, EST NON AFRICAN AMERICAN: 78 mL/min (ref >=60)
Glucose, Bld: 109 mg/dL — ABNORMAL HIGH (ref 70–99)
POTASSIUM: 4.4 mmol/L (ref 3.5–5.3)
Sodium: 142 mmol/L (ref 135–146)
Total Bilirubin: 0.8 mg/dL (ref 0.2–1.2)
Total Protein: 6.1 g/dL (ref 6.1–8.1)

## 2016-02-08 LAB — CBC WITH DIFFERENTIAL/PLATELET
BASOS ABS: 68 {cells}/uL (ref 0–200)
Basophils Relative: 1 %
Eosinophils Absolute: 544 cells/uL — ABNORMAL HIGH (ref 15–500)
Eosinophils Relative: 8 %
HEMATOCRIT: 43.5 % (ref 38.5–50.0)
HEMOGLOBIN: 14.9 g/dL (ref 13.0–17.0)
LYMPHS ABS: 1632 {cells}/uL (ref 850–3900)
LYMPHS PCT: 24 %
MCH: 32 pg (ref 27.0–33.0)
MCHC: 34.3 g/dL (ref 32.0–36.0)
MCV: 93.3 fL (ref 80.0–100.0)
MONO ABS: 476 {cells}/uL (ref 200–950)
MPV: 9.4 fL (ref 7.5–12.5)
Monocytes Relative: 7 %
NEUTROS PCT: 60 %
Neutro Abs: 4080 cells/uL (ref 1500–7800)
Platelets: 273 10*3/uL (ref 140–400)
RBC: 4.66 MIL/uL (ref 4.20–5.80)
RDW: 13.9 % (ref 11.0–15.0)
WBC: 6.8 10*3/uL (ref 3.8–10.8)

## 2016-02-08 LAB — HEMOGLOBIN A1C
HEMOGLOBIN A1C: 5.8 % — AB (ref <5.7)
MEAN PLASMA GLUCOSE: 120 mg/dL

## 2016-02-08 LAB — LIPID PANEL
Cholesterol: 161 mg/dL (ref 125–200)
HDL: 41 mg/dL (ref >=40)
LDL CALC: 104 mg/dL (ref <130)
TRIGLYCERIDES: 81 mg/dL (ref <150)
Total CHOL/HDL Ratio: 3.9 Ratio (ref <=5.0)
VLDL: 16 mg/dL (ref <30)

## 2016-02-09 ENCOUNTER — Encounter (HOSPITAL_COMMUNITY): Payer: Self-pay | Admitting: Physical Therapy

## 2016-02-09 LAB — PSA: PSA: 3.85 ng/mL (ref <=4.00)

## 2016-02-09 MED ORDER — RABEPRAZOLE SODIUM 20 MG PO TBEC: DELAYED_RELEASE_TABLET | ORAL | Status: DC

## 2016-02-09 NOTE — Addendum Note (Signed)
Addended by: [name] on: 02/09/2016 04:20 PM   Modules accepted: Orders

## 2016-02-09 NOTE — Telephone Encounter (Signed)
Pt is aware. He said that Protonix is not helping that much. His PCP tried for him to get Dexilant before and his insurance would not pay.  He would like to try the Aciphex.  He is aware to go to [facility] for the gastrin level.

## 2016-02-09 NOTE — Telephone Encounter (Addendum)
PLEASE CALL PT.  Rx  For Aciphex sent.

## 2016-02-13 ENCOUNTER — Encounter (HOSPITAL_COMMUNITY): Payer: Self-pay | Admitting: Physical Therapy

## 2016-02-13 NOTE — Telephone Encounter (Signed)
PT is aware.

## 2016-02-14 ENCOUNTER — Encounter (HOSPITAL_COMMUNITY): Payer: Self-pay

## 2016-02-16 ENCOUNTER — Encounter (HOSPITAL_COMMUNITY): Payer: Self-pay | Admitting: Physical Therapy

## 2016-02-19 ENCOUNTER — Encounter: Payer: Self-pay | Admitting: Family Medicine

## 2016-02-19 ENCOUNTER — Ambulatory Visit (INDEPENDENT_AMBULATORY_CARE_PROVIDER_SITE_OTHER): Payer: Medicare Other | Admitting: Family Medicine

## 2016-02-19 VITALS — BP 108/62 | HR 78 | Temp 97.8°F | Resp 16 | Ht 71.0 in | Wt 182.0 lb

## 2016-02-19 DIAGNOSIS — N400 Enlarged prostate without lower urinary tract symptoms: Secondary | ICD-10-CM | POA: Diagnosis not present

## 2016-02-19 DIAGNOSIS — E785 Hyperlipidemia, unspecified: Secondary | ICD-10-CM

## 2016-02-19 DIAGNOSIS — Z125 Encounter for screening for malignant neoplasm of prostate: Secondary | ICD-10-CM

## 2016-02-19 DIAGNOSIS — Z0000 Encounter for general adult medical examination without abnormal findings: Secondary | ICD-10-CM

## 2016-02-19 DIAGNOSIS — K269 Duodenal ulcer, unspecified as acute or chronic, without hemorrhage or perforation: Secondary | ICD-10-CM | POA: Diagnosis not present

## 2016-02-19 DIAGNOSIS — Z23 Encounter for immunization: Secondary | ICD-10-CM | POA: Diagnosis not present

## 2016-02-19 DIAGNOSIS — K2970 Gastritis, unspecified, without bleeding: Secondary | ICD-10-CM | POA: Diagnosis not present

## 2016-02-19 DIAGNOSIS — K2990 Gastroduodenitis, unspecified, without bleeding: Secondary | ICD-10-CM | POA: Diagnosis not present

## 2016-02-19 DIAGNOSIS — R7303 Prediabetes: Secondary | ICD-10-CM | POA: Diagnosis not present

## 2016-02-19 HISTORY — DX: Duodenal ulcer, unspecified as acute or chronic, without hemorrhage or perforation: K26.9

## 2016-02-19 MED ORDER — TRAMADOL HCL 50 MG PO TABS: ORAL_TABLET | ORAL | Status: DC

## 2016-02-19 NOTE — Assessment & Plan Note (Signed)
Cholesterol is controlled

## 2016-02-19 NOTE — Patient Instructions (Signed)
Stop the pantoprazole  Continue all other medications pnemonia vaccine Call Dr. . French Ana F/U 4 months

## 2016-02-19 NOTE — Assessment & Plan Note (Signed)
Review of medications he is supposed to be on AcipHex and lidocaine

## 2016-02-19 NOTE — Assessment & Plan Note (Signed)
A1c is much improved no medications needed

## 2016-02-19 NOTE — Progress Notes (Signed)
Patient ID: Benjamin [name], male   DOB: [birth year], 75 y.o.   MRN: [id number] Subjective:   Patient presents for Medicare Annual/Subsequent preventive examination.  Patient here for wellness exam. He is status post his gastroenterology workup he was found to have gastritis as well as esophagitis and duodenal ulcers. He has some confusional which proton pump inhibitor he is supposed to be on. His pain has improved however he is still eating the foods that contribute to his reflux. He has stopped taking anti-inflammatories.  He has had some problems with hip pain and shoulder pain which he is seeing orthopedics in the past for [place] recommended that he make a follow-up with them for possible steroid injection as he cannot take any anti-inflammatories at this time. It is also noted that he has clips in place and he cannot have an MRI for at least another month.  He requests a refill on his treatment Fasting labs reviewed   He also recently tripped on his dog and states that he hit the side of his ear on something he felt a popping sensation on the cartilage she would like for me to check this. Review Past Medical/Family/Social: Per EMR    Risk Factors  Current exercise habits: Walks Dietary issues discussed: Yes  Cardiac risk factors: borderline DM   Depression Screen  (Note: if answer to either of the following is "Yes", a more complete depression screening is indicated)  Over the past two weeks, have you felt down, depressed or hopeless? No Over the past two weeks, have you felt little interest or pleasure in doing things? Yes Have you lost interest or pleasure in daily life? No Do you often feel hopeless? No Do you cry easily over simple problems? No   Activities of Daily Living  In your present state of health, do you have any difficulty performing the following activities?:  Driving? No  Managing money? No  Feeding yourself? No  Getting from bed to chair? No  Climbing a flight of  stairs? No  Preparing food and eating?: No  Bathing or showering? No  Getting dressed: No  Getting to the toilet? No  Using the toilet:No  Moving around from place to place: Yes  In the past year have you fallen or had a near fall?:Yes Are you sexually active? No  Do you have more than one partner? No   Hearing Difficulties: No  Do you often ask people to speak up or repeat themselves? Yes   Do you experience ringing or noises in your ears? Yes  Do you have difficulty understanding soft or whispered voices? No  Do you feel that you have a problem with memory? Yes Do you often misplace items? No  Do you feel safe at home? Yes  Cognitive Testing  Alert? Yes Normal Appearance?Yes  Oriented to person? Yes Place? Yes  Time? Yes  Recall of three objects? Yes  Can perform simple calculations? Yes  Displays appropriate judgment?Yes  Can read the correct time from a watch face?Yes   List the Names of Other Physician/Practitioners you currently use: GI, Ortho    Screening Tests / Date Colonoscopy        UTD              Zostavax UTD  Pneumonia- Overdue  Influenza Vaccine UTD  Tetanus/tdapUTD  ROS: GEN- denies fatigue, fever, weight loss,weakness, recent illness HEENT- denies eye drainage, change in vision, nasal discharge, CVS- denies chest pain, palpitations RESP- denies SOB, cough, wheeze  ABD- denies N/V, change in stools, abd pain GU- denies dysuria, hematuria, dribbling, incontinence MSK- denies joint pain, muscle aches, injury Neuro- denies headache, dizziness, syncope, seizure activity  Physical:  GEN- NAD, alert and oriented x3 HEENT- PERRL, EOMI, non injected sclera, pink conjunctiva, MMM, oropharynx clear, Mild TTP over upper ear cartilage, no erythema, no deformity of ear, TM clear, canal clear  Neck- Supple, no thryomegaly CVS- RRR, no murmur RESP-CTAB EXT- No edema Pulses- Radial, DP- 2+      Assessment:    Annual wellness medicare exam   Plan:     During the course of the visit the patient was educated and counseled about appropriate screening and preventive services including:   Screen NEG for depression. PHQ- 4 score of 12  Diet review for nutrition referral? Yes ____ Not Indicated __x__  Patient Instructions (the written plan) was given to the patient.  Medicare Attestation  I have personally reviewed:  The patient's medical and social history  Their use of alcohol, tobacco or illicit drugs  Their current medications and supplements  The patient's functional ability including ADLs,fall risks, home safety risks, cognitive, and hearing and visual impairment  Diet and physical activities  Evidence for depression or mood disorders  The patient's weight, height, BMI, and visual acuity have been recorded in the chart. I have made referrals, counseling, and provided education to the patient based on review of the above and I have provided the patient with a written personalized care plan for preventive services.

## 2016-02-19 NOTE — Assessment & Plan Note (Signed)
Continue Flomax PSA also normal

## 2016-02-20 ENCOUNTER — Encounter (HOSPITAL_COMMUNITY): Payer: Self-pay | Admitting: Physical Therapy

## 2016-03-01 DIAGNOSIS — H00024 Hordeolum internum left upper eyelid: Secondary | ICD-10-CM | POA: Diagnosis not present

## 2016-03-08 DIAGNOSIS — M545 Low back pain: Secondary | ICD-10-CM | POA: Diagnosis not present

## 2016-03-08 DIAGNOSIS — M542 Cervicalgia: Secondary | ICD-10-CM | POA: Diagnosis not present

## 2016-03-08 DIAGNOSIS — M25551 Pain in right hip: Secondary | ICD-10-CM | POA: Diagnosis not present

## 2016-03-08 DIAGNOSIS — M25511 Pain in right shoulder: Secondary | ICD-10-CM | POA: Diagnosis not present

## 2016-04-05 DIAGNOSIS — M25511 Pain in right shoulder: Secondary | ICD-10-CM | POA: Diagnosis not present

## 2016-04-11 DIAGNOSIS — Z9889 Other specified postprocedural states: Secondary | ICD-10-CM | POA: Diagnosis not present

## 2016-04-11 DIAGNOSIS — M19011 Primary osteoarthritis, right shoulder: Secondary | ICD-10-CM | POA: Diagnosis not present

## 2016-04-11 DIAGNOSIS — M7591 Shoulder lesion, unspecified, right shoulder: Secondary | ICD-10-CM | POA: Diagnosis not present

## 2016-04-12 DIAGNOSIS — M25511 Pain in right shoulder: Secondary | ICD-10-CM | POA: Diagnosis not present

## 2016-05-13 ENCOUNTER — Encounter: Payer: Self-pay | Admitting: Physician Assistant

## 2016-05-13 ENCOUNTER — Ambulatory Visit (INDEPENDENT_AMBULATORY_CARE_PROVIDER_SITE_OTHER): Payer: Medicare Other | Admitting: Physician Assistant

## 2016-05-13 VITALS — BP 110/60 | HR 97 | Temp 97.1°F | Resp 16 | Wt 182.0 lb

## 2016-05-13 DIAGNOSIS — R4182 Altered mental status, unspecified: Secondary | ICD-10-CM
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
## 2016-05-14 ENCOUNTER — Other Ambulatory Visit: Payer: Self-pay | Admitting: Family Medicine

## 2016-05-14 ENCOUNTER — Other Ambulatory Visit: Payer: Medicare Other

## 2016-05-14 DIAGNOSIS — R4182 Altered mental status, unspecified: Secondary | ICD-10-CM | POA: Diagnosis not present

## 2016-05-14 LAB — CBC WITH DIFFERENTIAL/PLATELET
Basophils Absolute: 61 cells/uL (ref 0–200)
Basophils Relative: 1 %
EOS PCT: 4 %
Eosinophils Absolute: 244 cells/uL (ref 15–500)
HCT: 46.7 % (ref 38.5–50.0)
HEMOGLOBIN: 15.5 g/dL (ref 13.0–17.0)
LYMPHS ABS: 1586 {cells}/uL (ref 850–3900)
Lymphocytes Relative: 26 %
MCH: 30.9 pg (ref 27.0–33.0)
MCHC: 33.2 g/dL (ref 32.0–36.0)
MCV: 93.2 fL (ref 80.0–100.0)
MPV: 9.4 fL (ref 7.5–12.5)
Monocytes Absolute: 610 cells/uL (ref 200–950)
Monocytes Relative: 10 %
NEUTROS ABS: 3599 {cells}/uL (ref 1500–7800)
Neutrophils Relative %: 59 %
Platelets: 318 10*3/uL (ref 140–400)
RBC: 5.01 MIL/uL (ref 4.20–5.80)
RDW: 14.5 % (ref 11.0–15.0)
WBC: 6.1 10*3/uL (ref 3.8–10.8)

## 2016-05-14 LAB — URINALYSIS, ROUTINE W REFLEX MICROSCOPIC
Bilirubin Urine: NEGATIVE
Glucose, UA: NEGATIVE
Hgb urine dipstick: NEGATIVE
KETONES UR: NEGATIVE
Leukocytes, UA: NEGATIVE
NITRITE: NEGATIVE
Protein, ur: NEGATIVE
SPECIFIC GRAVITY, URINE: 1.025 (ref 1.001–1.035)
pH: 6 (ref 5.0–8.0)

## 2016-05-14 LAB — COMPLETE METABOLIC PANEL WITH GFR
ALBUMIN: 4.5 g/dL (ref 3.6–5.1)
ALK PHOS: 52 U/L (ref 40–115)
ALT: 18 U/L (ref 9–46)
AST: 21 U/L (ref 10–35)
BILIRUBIN TOTAL: 0.9 mg/dL (ref 0.2–1.2)
BUN: 14 mg/dL (ref 7–25)
CO2: 27 mmol/L (ref 20–31)
CREATININE: 1.09 mg/dL (ref 0.70–1.18)
Calcium: 9.4 mg/dL (ref 8.6–10.3)
Chloride: 106 mmol/L (ref 98–110)
GFR, EST AFRICAN AMERICAN: 77 mL/min (ref >=60)
GFR, EST NON AFRICAN AMERICAN: 67 mL/min (ref >=60)
GLUCOSE: 81 mg/dL (ref 70–99)
Potassium: 4.8 mmol/L (ref 3.5–5.3)
Sodium: 143 mmol/L (ref 135–146)
Total Protein: 6.6 g/dL (ref 6.1–8.1)

## 2016-05-14 LAB — TSH: TSH: 1.51 mIU/L (ref 0.40–4.50)

## 2016-05-16 LAB — URINE CULTURE: ORGANISM ID, BACTERIA: NO GROWTH

## 2016-05-17 DIAGNOSIS — M25511 Pain in right shoulder: Secondary | ICD-10-CM | POA: Diagnosis not present

## 2016-06-07 ENCOUNTER — Ambulatory Visit (INDEPENDENT_AMBULATORY_CARE_PROVIDER_SITE_OTHER): Payer: Medicare Other | Admitting: Gastroenterology

## 2016-06-07 ENCOUNTER — Encounter: Payer: Self-pay | Admitting: Gastroenterology

## 2016-06-07 VITALS — BP 119/80 | HR 81 | Temp 97.0°F | Ht 71.0 in | Wt 184.7 lb

## 2016-06-07 DIAGNOSIS — K269 Duodenal ulcer, unspecified as acute or chronic, without hemorrhage or perforation: Secondary | ICD-10-CM

## 2016-06-07 DIAGNOSIS — K529 Noninfective gastroenteritis and colitis, unspecified: Secondary | ICD-10-CM | POA: Insufficient documentation

## 2016-06-07 DIAGNOSIS — R195 Other fecal abnormalities: Secondary | ICD-10-CM | POA: Diagnosis not present

## 2016-06-07 NOTE — Assessment & Plan Note (Signed)
Noted since cruise. Check Cdiff, culture, Giardia. No concerning signs on today's exam. Further recommendations to follow. As of note, next colonoscopy in 3 years due to large adenoma.

## 2016-06-07 NOTE — Patient Instructions (Signed)
Please take Aciphex when you wake up on an empty stomach.  Please have blood work done today. Please complete the stool studies when you are able.   We will see you in 3 months!

## 2016-06-07 NOTE — Assessment & Plan Note (Signed)
On EGD. Not taking PPI as directed. Start taking once each morning upon waking. Discussed in detail. Check gastrin level. Return in 3 months for reassessment.
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
## 2016-06-10 ENCOUNTER — Telehealth: Payer: Self-pay

## 2016-06-10 ENCOUNTER — Other Ambulatory Visit: Payer: Self-pay | Admitting: Gastroenterology

## 2016-06-10 DIAGNOSIS — K269 Duodenal ulcer, unspecified as acute or chronic, without hemorrhage or perforation: Secondary | ICD-10-CM | POA: Diagnosis not present

## 2016-06-10 DIAGNOSIS — R195 Other fecal abnormalities: Secondary | ICD-10-CM | POA: Diagnosis not present

## 2016-06-10 NOTE — Progress Notes (Signed)
cc'ed to pcp °

## 2016-06-10 NOTE — Telephone Encounter (Signed)
T/C from [place] at [place], saying the C Diff Toxin  [phone] has been discontinued.  They are able to do the C Diff by PCR.

## 2016-06-11 LAB — CLOSTRIDIUM DIFFICILE BY PCR: CDIFFPCR: NOT DETECTED

## 2016-06-12 NOTE — Telephone Encounter (Signed)
Sounds great!

## 2016-06-13 LAB — GIARDIA ANTIGEN

## 2016-06-13 LAB — GASTRIN: GASTRIN: 38 pg/mL (ref <=100)

## 2016-06-14 LAB — STOOL CULTURE

## 2016-06-19 NOTE — Progress Notes (Signed)
Gastrin level normal. Stool studies normal. Have him start taking supplemental fiber daily. How are stools?

## 2016-06-20 NOTE — Progress Notes (Signed)
LMOM to call.

## 2016-06-20 NOTE — Progress Notes (Signed)
Letter mailed for pt to call.  

## 2016-06-25 ENCOUNTER — Encounter: Payer: Self-pay | Admitting: Family Medicine

## 2016-06-25 ENCOUNTER — Ambulatory Visit (INDEPENDENT_AMBULATORY_CARE_PROVIDER_SITE_OTHER): Payer: Medicare Other | Admitting: Family Medicine

## 2016-06-25 VITALS — BP 118/74 | HR 90 | Temp 97.8°F | Resp 16 | Wt 182.2 lb

## 2016-06-25 DIAGNOSIS — N400 Enlarged prostate without lower urinary tract symptoms: Secondary | ICD-10-CM | POA: Diagnosis not present

## 2016-06-25 DIAGNOSIS — Z23 Encounter for immunization: Secondary | ICD-10-CM | POA: Diagnosis not present

## 2016-06-25 DIAGNOSIS — J410 Simple chronic bronchitis: Secondary | ICD-10-CM | POA: Diagnosis not present

## 2016-06-25 DIAGNOSIS — N529 Male erectile dysfunction, unspecified: Secondary | ICD-10-CM

## 2016-06-25 MED ORDER — TADALAFIL 5 MG PO TABS: 5.0000 mg | ORAL_TABLET | Freq: Every day | ORAL | 6 refills | Status: DC

## 2016-06-25 NOTE — Progress Notes (Signed)
   Subjective:    Patient ID: Benjamin [name], male    DOB: [birth year], 75 y.o.   MRN: [id number]  Patient presents for Follow-up Patient here to follow-up chronic medical problems. Medications reviewed. He still being followed by gastroenterology was last seen on the 22nd secondary history of duodenal ulcers and esophagitis, adenoma. He is on surveillance for the next 3 years. He is supposed to be on AcipHex but he forgets to take it in the afternoon He did return from a cruise with some watery stools reviewed the labs done by gastroenterology and they evaluated this his studies were normal. He is taking tramadol as needed for arthritis pain- Dr French Ana is Orthopedics-  he is also on Flomax for BPH. He isn't having difficulty with erectile dysfunction he is tried Viagra in the past this does not help. He is quite intimate with his wife and will like something to help with this.  He has COPD but has not had any symptoms and does not want treatment at this time   He was seen a month or so ago with some confused state he now recalls that he took 2 tramadol the night before and this is likely what caused him to be confused the next morning while trying to drive Due for flu shot  Review Of Systems:  GEN- denies fatigue, fever, weight loss,weakness, recent illness HEENT- denies eye drainage, change in vision, nasal discharge, CVS- denies chest pain, palpitations RESP- denies SOB, cough, wheeze ABD- denies N/V, change in stools, abd pain GU- denies dysuria, hematuria, dribbling, incontinence MSK- denies joint pain, muscle aches, injury Neuro- denies headache, dizziness, syncope, seizure activity       Objective:    BP 118/74   Pulse 90   Temp 97.8 F (36.6 C) (Oral)   Resp 16   Wt 182 lb 3.2 oz (82.6 kg)   BMI 25.41 kg/m  GEN- NAD, alert and oriented x3 HEENT- PERRL, EOMI, non injected sclera, pink conjunctiva, MMM, oropharynx clear CVS- RRR, no  murmur RESP-CTAB ABD-NABS,soft,NT,ND EXT- No edema Pulses- Radial  2+        Assessment & Plan:      Problem List Items Addressed This Visit    Erectile dysfunction    He has erectile dysfunction as well as BPH. We'll try him on Cialis 5 mg once a day. I'll hold the Flomax so that he is not having difficulty with his pressure and see how he does with just the Cialis      COPD (chronic obstructive pulmonary disease) ([facility])    Currently stable no current medications he was given flu shot      BPH (benign prostatic hyperplasia) - Primary    Other Visit Diagnoses    Flu vaccine need       Relevant Orders   Flu Vaccine QUAD 36+ mos IM (Completed)      Note: This dictation was prepared with Dragon dictation along with smaller phrase technology. Any transcriptional errors that result from this process are unintentional.

## 2016-06-25 NOTE — Patient Instructions (Addendum)
F/U June for Physical Try Cialis daily, You can stop the flomax

## 2016-06-26 ENCOUNTER — Encounter: Payer: Self-pay | Admitting: Family Medicine

## 2016-06-26 NOTE — Assessment & Plan Note (Signed)
Currently stable no current medications he was given flu shot

## 2016-06-26 NOTE — Assessment & Plan Note (Signed)
He has erectile dysfunction as well as BPH. We'll try him on Cialis 5 mg once a day. I'll hold the Flomax so that he is not having difficulty with his pressure and see how he does with just the Cialis

## 2016-06-27 ENCOUNTER — Telehealth: Payer: Self-pay | Admitting: *Deleted

## 2016-06-27 ENCOUNTER — Telehealth: Payer: Self-pay

## 2016-06-27 NOTE — Telephone Encounter (Signed)
Pt called and said that he received a letter to call you. Please call back

## 2016-06-27 NOTE — Telephone Encounter (Signed)
Received request from pharmacy for PA on Cialis.  PA submitted.   Dx: N40.0- BPH

## 2016-07-02 NOTE — Telephone Encounter (Signed)
PT is aware his gastrin level is normal and stool studies normal. He is aware to start taking supplemental fiber daily. He said his stools have been dong great until today and he has had 2 loose stools. I told him if he has multiple stools daily to let us know and he said that he will do so.

## 2016-07-02 NOTE — Telephone Encounter (Signed)
LMOM for a return call. Look under labs 06/10/2016 under gastrin for the results to give to the pt.

## 2016-07-02 NOTE — Telephone Encounter (Signed)
Received PA determination.   PA approved 04/28/2016- 06/27/2017.  Pharmacy made aware.

## 2016-08-12 ENCOUNTER — Encounter: Payer: Self-pay | Admitting: Family Medicine

## 2016-08-12 ENCOUNTER — Ambulatory Visit (HOSPITAL_COMMUNITY)
Admission: RE | Admit: 2016-08-12 | Discharge: 2016-08-12 | Disposition: A | Discharge status: Home / Self Care | Payer: Medicare Other | Source: Ambulatory Visit | Attending: Family Medicine | Admitting: Family Medicine

## 2016-08-12 ENCOUNTER — Ambulatory Visit (INDEPENDENT_AMBULATORY_CARE_PROVIDER_SITE_OTHER): Payer: Medicare Other | Admitting: Family Medicine

## 2016-08-12 VITALS — BP 122/74 | HR 88 | Temp 98.0°F | Resp 14 | Ht 71.0 in | Wt 180.0 lb

## 2016-08-12 DIAGNOSIS — W19XXXD Unspecified fall, subsequent encounter: Secondary | ICD-10-CM | POA: Insufficient documentation

## 2016-08-12 DIAGNOSIS — M546 Pain in thoracic spine: Secondary | ICD-10-CM | POA: Diagnosis not present

## 2016-08-12 DIAGNOSIS — S299XXA Unspecified injury of thorax, initial encounter: Secondary | ICD-10-CM | POA: Diagnosis not present

## 2016-08-12 DIAGNOSIS — M4804 Spinal stenosis, thoracic region: Secondary | ICD-10-CM | POA: Diagnosis not present

## 2016-08-12 DIAGNOSIS — I700 Atherosclerosis of aorta: Secondary | ICD-10-CM | POA: Insufficient documentation

## 2016-08-12 MED ORDER — TIZANIDINE HCL 4 MG PO CAPS: 4.0000 mg | ORAL_CAPSULE | Freq: Three times a day (TID) | ORAL | 0 refills | Status: DC

## 2016-08-12 MED ORDER — TRAMADOL HCL 50 MG PO TABS: ORAL_TABLET | ORAL | 2 refills | Status: DC

## 2016-08-12 MED ORDER — METHYLPREDNISOLONE 4 MG PO TBPK: ORAL_TABLET | ORAL | 0 refills | Status: DC

## 2016-08-12 MED ORDER — HYDROCODONE-ACETAMINOPHEN 5-325 MG PO TABS: 1.0000 | ORAL_TABLET | Freq: Four times a day (QID) | ORAL | 0 refills | Status: DC | PRN

## 2016-08-12 NOTE — Progress Notes (Signed)
   Subjective:    Patient ID: Benjamin [name], male    DOB: [birth year], 75 y.o.   MRN: [id number]  Patient presents for Upper Back Pain (x1 week- reports constant throbby muscle pain across back at shoulder blades- denies injury)  Patient here with mid thoracic back pain for the past week that radiates below both shoulder blades He has she sustained a fall about 3 weeks ago jarring his lower back but he denies any new radiating symptoms. He has tramadol as well as hydrocodone at home which she has been taking as needed. Last week he was lifting some heavy trash bags full of magazines the next day he felt pain in his mid back along with spasms. He now has pain when he tries to raise his arms up or when he moves. He denies any change in bowel or bladder is tenderness right over the middle of his spine   Review Of Systems:  GEN- denies fatigue, fever, weight loss,weakness, recent illness HEENT- denies eye drainage, change in vision, nasal discharge, CVS- denies chest pain, palpitations RESP- denies SOB, cough, wheeze ABD- denies N/V, change in stools, abd pain GU- denies dysuria, hematuria, dribbling, incontinence MSK- + joint pain, muscle aches, injury Neuro- denies headache, dizziness, syncope, seizure activity       Objective:    BP 122/74 (BP Location: Left Arm, Patient Position: Sitting, Cuff Size: Large)   Pulse 88   Temp 98 F (36.7 C) (Oral)   Resp 14   Ht 5\' 11"  (1.803 m)   Wt 180 lb (81.6 kg)   SpO2 98%   BMI 25.10 kg/m  GEN- NAD, alert and oriented x3 Neck- fair ROM, neg spurlings MSK- TTP mid thoracic spine, +paraspinal spasm, mild swelling over muscles, no winged scapula  Lumbar spine NT, fair ROM Lumbar spine, neg SLR, fair ROM hips Fair ROM bilat shoulders R <l (chronic)        Assessment & Plan:      Problem List Items Addressed This Visit    None    Visit Diagnoses    Thoracic spine pain    -  Primary   S/P fall, known DDD, as he had point tenderness and at  age, higher risk for fracture, but XRAY NEG. For muscle spasm and bruising from fall, zanaflex, medrol dosepak has worked best for his pain/inflammation, unable to take NSAIDS due to duodenal ulcer    Relevant Medications   traMADol (ULTRAM) 50 MG tablet   tiZANidine (ZANAFLEX) 4 MG capsule   HYDROcodone-acetaminophen (NORCO) 5-325 MG tablet   methylPREDNISolone (MEDROL DOSEPAK) 4 MG TBPK tablet   Other Relevant Orders   DG Thoracic Spine W/Swimmers (Completed)   Fall, subsequent encounter       Relevant Orders   DG Thoracic Spine W/Swimmers (Completed)      Note: This dictation was prepared with Dragon dictation along with smaller phrase technology. Any transcriptional errors that result from this process are unintentional.

## 2016-08-12 NOTE — Patient Instructions (Signed)
Pain medication refilled Get xray  F/U pending

## 2016-08-14 ENCOUNTER — Other Ambulatory Visit: Payer: Self-pay | Admitting: Family Medicine

## 2016-08-14 NOTE — Telephone Encounter (Signed)
Rx filled per protocol  

## 2016-09-04 ENCOUNTER — Encounter: Payer: Self-pay | Admitting: Gastroenterology

## 2016-09-04 ENCOUNTER — Ambulatory Visit (INDEPENDENT_AMBULATORY_CARE_PROVIDER_SITE_OTHER): Payer: Self-pay | Admitting: Gastroenterology

## 2016-09-04 ENCOUNTER — Telehealth: Payer: Self-pay | Admitting: Gastroenterology

## 2016-09-04 VITALS — BP 110/69 | HR 93 | Temp 98.0°F | Ht 71.0 in | Wt 182.2 lb

## 2016-09-04 DIAGNOSIS — R195 Other fecal abnormalities: Secondary | ICD-10-CM

## 2016-09-04 NOTE — Telephone Encounter (Signed)
I personally called the patient on his cell phone and apologized that he had to wait. I was tied up with the [facility] lab, dealing with a lab issue. I apologized to him and asked if there was anything I could help with, and he said no. The phone then went silent, but it sounded as if he was still there. I believe he may have attempted to hang up, but didn't realize it did not hang up all the way.   I called his home phone in the hopes that I could not lose the connection, and I left a message stating if he needed anything, to please call us, and again it was out of my control and I understand his frustration.

## 2016-09-04 NOTE — Telephone Encounter (Signed)
PATIENT LEFT AND STATED HE WAITED 45 MINUTES FOR SOMEONE TO SEE HIM AND TO NOT BILL THIS VISIT.  I CANCELLED THE CHECK IN FOR THE VISIT.

## 2016-09-04 NOTE — Progress Notes (Signed)
   Patient left prior to being seen. Please see phone note.   Benjamin [name], ANP-BC [facility]

## 2016-09-13 ENCOUNTER — Encounter: Payer: Self-pay | Admitting: Family Medicine

## 2016-09-13 ENCOUNTER — Ambulatory Visit (INDEPENDENT_AMBULATORY_CARE_PROVIDER_SITE_OTHER): Payer: Medicare Other | Admitting: Family Medicine

## 2016-09-13 VITALS — BP 118/70 | HR 98 | Temp 97.9°F | Resp 20 | Ht 71.5 in | Wt 182.0 lb

## 2016-09-13 DIAGNOSIS — B9689 Other specified bacterial agents as the cause of diseases classified elsewhere: Secondary | ICD-10-CM

## 2016-09-13 DIAGNOSIS — J019 Acute sinusitis, unspecified: Secondary | ICD-10-CM | POA: Diagnosis not present

## 2016-09-13 MED ORDER — AMOXICILLIN-POT CLAVULANATE 875-125 MG PO TABS: 1.0000 | ORAL_TABLET | Freq: Two times a day (BID) | ORAL | 0 refills | Status: DC
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
## 2016-10-28 ENCOUNTER — Other Ambulatory Visit: Payer: Self-pay | Admitting: Family Medicine

## 2016-10-28 ENCOUNTER — Other Ambulatory Visit: Payer: Self-pay

## 2016-10-28 DIAGNOSIS — K2990 Gastroduodenitis, unspecified, without bleeding: Principal | ICD-10-CM

## 2016-10-28 DIAGNOSIS — K2970 Gastritis, unspecified, without bleeding: Secondary | ICD-10-CM | POA: Diagnosis not present

## 2016-10-28 DIAGNOSIS — N400 Enlarged prostate without lower urinary tract symptoms: Secondary | ICD-10-CM | POA: Diagnosis not present

## 2016-10-28 DIAGNOSIS — E785 Hyperlipidemia, unspecified: Secondary | ICD-10-CM | POA: Diagnosis not present

## 2016-10-28 DIAGNOSIS — Z125 Encounter for screening for malignant neoplasm of prostate: Secondary | ICD-10-CM | POA: Diagnosis not present

## 2016-10-28 DIAGNOSIS — N529 Male erectile dysfunction, unspecified: Secondary | ICD-10-CM | POA: Diagnosis not present

## 2016-10-28 DIAGNOSIS — R7303 Prediabetes: Secondary | ICD-10-CM | POA: Diagnosis not present

## 2016-10-28 DIAGNOSIS — H9193 Unspecified hearing loss, bilateral: Secondary | ICD-10-CM

## 2016-10-28 DIAGNOSIS — K5900 Constipation, unspecified: Secondary | ICD-10-CM

## 2016-10-28 NOTE — Progress Notes (Signed)
Pt here with wife, states Cialis does not work- wants to try tesosterone advised needs labs   ALso needs updated hearing exam, he ordered hearing aides off the internet , has had worsening hearing function   Needs new referral to GI for chronic abdominal pain, constipation

## 2016-10-29 LAB — COMPLETE METABOLIC PANEL WITH GFR
ALBUMIN: 4.1 g/dL (ref 3.6–5.1)
ALK PHOS: 51 U/L (ref 40–115)
ALT: 36 U/L (ref 9–46)
AST: 30 U/L (ref 10–35)
BUN: 11 mg/dL (ref 7–25)
CHLORIDE: 109 mmol/L (ref 98–110)
CO2: 25 mmol/L (ref 20–31)
Calcium: 8.9 mg/dL (ref 8.6–10.3)
Creat: 0.87 mg/dL (ref 0.70–1.18)
GFR, Est African American: >89 mL/min (ref >=60)
GFR, Est Non African American: 85 mL/min (ref >=60)
GLUCOSE: 96 mg/dL (ref 70–99)
POTASSIUM: 4.3 mmol/L (ref 3.5–5.3)
SODIUM: 143 mmol/L (ref 135–146)
Total Bilirubin: 0.9 mg/dL (ref 0.2–1.2)
Total Protein: 6.3 g/dL (ref 6.1–8.1)

## 2016-10-29 LAB — PSA: PSA: 3.3 ng/mL (ref <=4.0)

## 2016-10-29 LAB — CBC WITH DIFFERENTIAL/PLATELET
BASOS ABS: 51 {cells}/uL (ref 0–200)
Basophils Relative: 1 %
EOS ABS: 204 {cells}/uL (ref 15–500)
EOS PCT: 4 %
HCT: 45.1 % (ref 38.5–50.0)
HEMOGLOBIN: 15.4 g/dL (ref 13.0–17.0)
LYMPHS ABS: 1530 {cells}/uL (ref 850–3900)
Lymphocytes Relative: 30 %
MCH: 31.1 pg (ref 27.0–33.0)
MCHC: 34.1 g/dL (ref 32.0–36.0)
MCV: 91.1 fL (ref 80.0–100.0)
MPV: 9.4 fL (ref 7.5–12.5)
Monocytes Absolute: 510 cells/uL (ref 200–950)
Monocytes Relative: 10 %
NEUTROS ABS: 2805 {cells}/uL (ref 1500–7800)
NEUTROS PCT: 55 %
Platelets: 289 10*3/uL (ref 140–400)
RBC: 4.95 MIL/uL (ref 4.20–5.80)
RDW: 14.1 % (ref 11.0–15.0)
WBC: 5.1 10*3/uL (ref 3.8–10.8)

## 2016-10-29 LAB — LIPID PANEL
CHOL/HDL RATIO: 3.6 ratio (ref <5.0)
Cholesterol: 108 mg/dL (ref <200)
HDL: 30 mg/dL — AB (ref >40)
LDL CALC: 63 mg/dL (ref <100)
Triglycerides: 73 mg/dL (ref <150)
VLDL: 15 mg/dL (ref <30)

## 2016-10-29 LAB — HEMOGLOBIN A1C
HEMOGLOBIN A1C: 5.8 % — AB (ref <5.7)
MEAN PLASMA GLUCOSE: 120 mg/dL

## 2016-10-29 LAB — TESTOSTERONE: Testosterone: 406 ng/dL (ref 250–827)

## 2016-11-06 ENCOUNTER — Encounter: Payer: Self-pay | Admitting: Gastroenterology

## 2016-11-19 DIAGNOSIS — M9901 Segmental and somatic dysfunction of cervical region: Secondary | ICD-10-CM | POA: Diagnosis not present

## 2016-11-19 DIAGNOSIS — M5033 Other cervical disc degeneration, cervicothoracic region: Secondary | ICD-10-CM | POA: Diagnosis not present

## 2016-11-21 DIAGNOSIS — M9901 Segmental and somatic dysfunction of cervical region: Secondary | ICD-10-CM | POA: Diagnosis not present

## 2016-11-21 DIAGNOSIS — M5033 Other cervical disc degeneration, cervicothoracic region: Secondary | ICD-10-CM | POA: Diagnosis not present

## 2016-11-21 DIAGNOSIS — H903 Sensorineural hearing loss, bilateral: Secondary | ICD-10-CM | POA: Diagnosis not present

## 2016-11-26 DIAGNOSIS — M9901 Segmental and somatic dysfunction of cervical region: Secondary | ICD-10-CM | POA: Diagnosis not present

## 2016-11-26 DIAGNOSIS — M5033 Other cervical disc degeneration, cervicothoracic region: Secondary | ICD-10-CM | POA: Diagnosis not present

## 2016-11-27 ENCOUNTER — Encounter: Payer: Self-pay | Admitting: Gastroenterology

## 2016-11-27 ENCOUNTER — Ambulatory Visit (INDEPENDENT_AMBULATORY_CARE_PROVIDER_SITE_OTHER): Payer: Medicare Other | Admitting: Gastroenterology

## 2016-11-27 VITALS — BP 115/75 | HR 90 | Temp 96.9°F | Ht 72.0 in | Wt 174.6 lb

## 2016-11-27 DIAGNOSIS — K219 Gastro-esophageal reflux disease without esophagitis: Secondary | ICD-10-CM | POA: Diagnosis not present

## 2016-11-27 DIAGNOSIS — R195 Other fecal abnormalities: Secondary | ICD-10-CM

## 2016-11-27 MED ORDER — HYOSCYAMINE SULFATE 0.125 MG SL SUBL: 0.1250 mg | SUBLINGUAL_TABLET | SUBLINGUAL | 3 refills | Status: DC | PRN

## 2016-11-27 NOTE — Assessment & Plan Note (Addendum)
Onset in Sept 2017 after returning from cruise, but stool studies normal. Abdominal pain rare but sometimes associated with loose stool. Colonoscopy up-to-date as of last year. Query post-infectious IBS. Will trial Levsin for now, but I have asked him to call me with persisting symptoms as will likely repeat more extensive stool studies (to include fecal elastase), repeat gastrin level, possible eval for carcinoid although this is low on differential. Consider HBT vs Xifaxan course if negative stool studies. 3 month follow-up. No alarm symptoms.
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
## 2016-11-27 NOTE — Patient Instructions (Signed)
For loose stools/abdominal discomfort: I have sent a medication called Levsin into your pharmacy that you may take every 4-6 hours as needed. Let me know how this is in 2 weeks. We may need to order special tests to check for malabsorption or bacterial overgrowth.   For reflux: stop taking Aciphex and Pepcid (which is over the counter). Take Dexilant samples once each morning. Let me know how this works for you.   We will see you in 3 months but call if no improvement in abdominal pain in the meantime.

## 2016-11-27 NOTE — Assessment & Plan Note (Signed)
Uncontrolled. Stop Aciphex. Trial Dexilant samples. EGD on file last year with esophagitis, esophageal stenosis, gastritis, duodenitis, multiple non-bleeding ulcers, gastrin level normal.

## 2016-11-27 NOTE — Progress Notes (Signed)
cc'ed to pcp °

## 2016-11-28 DIAGNOSIS — M9901 Segmental and somatic dysfunction of cervical region: Secondary | ICD-10-CM | POA: Diagnosis not present

## 2016-11-28 DIAGNOSIS — M5033 Other cervical disc degeneration, cervicothoracic region: Secondary | ICD-10-CM | POA: Diagnosis not present

## 2016-12-05 DIAGNOSIS — M9901 Segmental and somatic dysfunction of cervical region: Secondary | ICD-10-CM | POA: Diagnosis not present

## 2016-12-05 DIAGNOSIS — M5033 Other cervical disc degeneration, cervicothoracic region: Secondary | ICD-10-CM | POA: Diagnosis not present

## 2016-12-10 DIAGNOSIS — M5033 Other cervical disc degeneration, cervicothoracic region: Secondary | ICD-10-CM | POA: Diagnosis not present

## 2016-12-10 DIAGNOSIS — M9901 Segmental and somatic dysfunction of cervical region: Secondary | ICD-10-CM | POA: Diagnosis not present

## 2016-12-17 DIAGNOSIS — M9901 Segmental and somatic dysfunction of cervical region: Secondary | ICD-10-CM | POA: Diagnosis not present

## 2016-12-17 DIAGNOSIS — M5033 Other cervical disc degeneration, cervicothoracic region: Secondary | ICD-10-CM | POA: Diagnosis not present

## 2016-12-18 DIAGNOSIS — M9901 Segmental and somatic dysfunction of cervical region: Secondary | ICD-10-CM | POA: Diagnosis not present

## 2016-12-18 DIAGNOSIS — M5033 Other cervical disc degeneration, cervicothoracic region: Secondary | ICD-10-CM | POA: Diagnosis not present

## 2016-12-19 DIAGNOSIS — M9901 Segmental and somatic dysfunction of cervical region: Secondary | ICD-10-CM | POA: Diagnosis not present

## 2016-12-19 DIAGNOSIS — M5033 Other cervical disc degeneration, cervicothoracic region: Secondary | ICD-10-CM | POA: Diagnosis not present

## 2017-01-01 NOTE — Progress Notes (Signed)
REVIEWED. Check fecal lactoferrin and TTG IgA. IF NEGATIVE, CONSIDER XIFAXAN TRIAL OR EMPIRIC TREATMENT FOR SIBO.

## 2017-01-02 ENCOUNTER — Other Ambulatory Visit: Payer: Self-pay

## 2017-01-02 DIAGNOSIS — R195 Other fecal abnormalities: Secondary | ICD-10-CM

## 2017-01-02 NOTE — Progress Notes (Signed)
Per Dr. Oneida Alar recommendations: please order fecal lactoferrin and TTg, IgA.

## 2017-01-03 NOTE — Progress Notes (Signed)
LMOM for a return call. Lab orders entered and stool container and orders at front desk for pt to take to [facility].

## 2017-01-03 NOTE — Progress Notes (Signed)
Pt is aware and will come by to pick up orders and container.

## 2017-01-20 ENCOUNTER — Other Ambulatory Visit: Payer: Self-pay | Admitting: Gastroenterology

## 2017-01-30 ENCOUNTER — Telehealth: Payer: Self-pay

## 2017-01-30 NOTE — Telephone Encounter (Signed)
pts insurance has denied the zofran. They will only pay for it if the pt is getting cancer treatments or having surgery. Paperwork on AB desk.

## 2017-01-31 NOTE — Telephone Encounter (Signed)
I recommend a GES if he is willing to do this.

## 2017-01-31 NOTE — Telephone Encounter (Signed)
Noted. If he is not having any nausea, will not need to worry about this any longer.

## 2017-01-31 NOTE — Telephone Encounter (Signed)
PT said he is still getting nauseated after every meal. So he does need something. Also, He said Roseanne Kaufman, NP took him off of the Aciphex and had him try Dexilant. He said the Dexilant does not help at all and he went back on the Aciphex and he will need refills on it.  Please advise!

## 2017-02-03 ENCOUNTER — Other Ambulatory Visit: Payer: Self-pay

## 2017-02-03 DIAGNOSIS — R112 Nausea with vomiting, unspecified: Secondary | ICD-10-CM

## 2017-02-03 MED ORDER — RABEPRAZOLE SODIUM 20 MG PO TBEC: 20.0000 mg | DELAYED_RELEASE_TABLET | Freq: Every day | ORAL | 3 refills | Status: DC

## 2017-02-03 NOTE — Telephone Encounter (Signed)
I called and informed pt. He thought he did the lab and stool last week. I called [facility] and Vaughan Basta said the orders are still there. All pt needs to do is come by and pick up the container for the stool and they will give him a hat to do it in.  PT is aware and said he will do so.  He is aware Aciphex has been sent in.

## 2017-02-03 NOTE — Telephone Encounter (Signed)
Pt is set up for GES on 02/11/17 @ 8:00 am. His wife is aware and instructions are in the mail

## 2017-02-03 NOTE — Telephone Encounter (Signed)
I sent in Aciphex. Per Dr. Oneida Alar' last recommendation, needs to to the fecal lactoferrin and the TTg, IgA that has been ordered.   If these are negative, will treat diarrhea with round of Xifaxan.   Needs OV with Dr. Oneida Alar only in next 6- 8 weeks.

## 2017-02-03 NOTE — Telephone Encounter (Signed)
I called pt and it is OK to schedule the GES. ( Call home phone number).  He wants to know if he is going to get any Aciphex sent in. Also, he has had diarrhea x 3 days, it has been gong on in spurts for awhile.  Benjamin [name], please advise!

## 2017-02-07 DIAGNOSIS — R195 Other fecal abnormalities: Secondary | ICD-10-CM | POA: Diagnosis not present

## 2017-02-11 ENCOUNTER — Encounter (HOSPITAL_COMMUNITY)
Admission: RE | Admit: 2017-02-11 | Discharge: 2017-02-11 | Disposition: A | Discharge status: Home / Self Care | Payer: Medicare Other | Source: Ambulatory Visit | Attending: Gastroenterology | Admitting: Gastroenterology

## 2017-02-11 ENCOUNTER — Encounter (HOSPITAL_COMMUNITY): Payer: Self-pay

## 2017-02-11 DIAGNOSIS — R195 Other fecal abnormalities: Secondary | ICD-10-CM | POA: Diagnosis not present

## 2017-02-11 DIAGNOSIS — R112 Nausea with vomiting, unspecified: Secondary | ICD-10-CM

## 2017-02-11 LAB — TISSUE TRANSGLUTAMINASE, IGA: Tissue Transglutaminase Ab, IgA: 1 U/mL (ref <4)

## 2017-02-12 ENCOUNTER — Ambulatory Visit (INDEPENDENT_AMBULATORY_CARE_PROVIDER_SITE_OTHER): Payer: Medicare Other | Admitting: Family Medicine

## 2017-02-12 ENCOUNTER — Encounter: Payer: Self-pay | Admitting: Family Medicine

## 2017-02-12 VITALS — BP 122/66 | HR 82 | Temp 97.6°F | Resp 16 | Ht 72.0 in | Wt 181.0 lb

## 2017-02-12 DIAGNOSIS — R1013 Epigastric pain: Secondary | ICD-10-CM

## 2017-02-12 DIAGNOSIS — G8929 Other chronic pain: Secondary | ICD-10-CM | POA: Diagnosis not present

## 2017-02-12 DIAGNOSIS — K269 Duodenal ulcer, unspecified as acute or chronic, without hemorrhage or perforation: Secondary | ICD-10-CM | POA: Diagnosis not present

## 2017-02-12 DIAGNOSIS — H6122 Impacted cerumen, left ear: Secondary | ICD-10-CM | POA: Diagnosis not present

## 2017-02-12 DIAGNOSIS — M546 Pain in thoracic spine: Secondary | ICD-10-CM | POA: Diagnosis not present

## 2017-02-12 DIAGNOSIS — K529 Noninfective gastroenteritis and colitis, unspecified: Secondary | ICD-10-CM | POA: Diagnosis not present

## 2017-02-12 MED ORDER — PROMETHAZINE HCL 12.5 MG PO TABS: 12.5000 mg | ORAL_TABLET | Freq: Three times a day (TID) | ORAL | 2 refills | Status: DC | PRN

## 2017-02-12 MED ORDER — HYDROCODONE-ACETAMINOPHEN 5-325 MG PO TABS: 1.0000 | ORAL_TABLET | Freq: Four times a day (QID) | ORAL | 0 refills | Status: DC | PRN

## 2017-02-12 NOTE — Assessment & Plan Note (Signed)
Chronic pain, has seen ortho, had imaging He can not take NSAIDS Tylenol does not helo Refilled Norco use sparingly,he also uses topical rubs

## 2017-02-12 NOTE — Assessment & Plan Note (Signed)
Continue aciphex, given phenergan for nausea Uses lesvin as needed  Send to new GI for further evaluation

## 2017-02-12 NOTE — Progress Notes (Signed)
   Subjective:    Patient ID: Benjamin [name], male    DOB: 10-Jul-1941, 76 y.o.   MRN: [id number]  Patient presents for Ear cleaning (has buildup per audiologist); Back Pain (upper mid back pain- lateral pain from shoulder to shoulder); and Referral (wasnt to change GI MD to  location)   Pt here with multiple concerns.   Upper back pain- has known DDD, history of GI ulcer therefore unable to take NSAIDS. Has seen Ortho, they also noted shoulder pathology but he declines any intervention for this. Has had ultram and Norco, initially stated he did not want to be on either,    Cerumen Buildup- needs ears cleaned out, seen by audiologist  Last week which he was referred to from ENT DR. Janace Hoard. He wears hearing aides    Currently seeing GI, due to history of gastric ulcer , they also recommend GES  As he has had continued Nausea, diarrhea episodes. He has not liked the follow up with that office and request a new GI. He does take Aciphex, but has not had lesvin or zofran. I called pharmacy Zofran he has exceeded limits for his insurance plan.     Review Of Systems:  GEN- denies fatigue, fever, weight loss,weakness, recent illness HEENT- denies eye drainage, change in vision, nasal discharge, CVS- denies chest pain, palpitations RESP- denies SOB, cough, wheeze ABD- denies N/V, change in stools, abd pain GU- denies dysuria, hematuria, dribbling, incontinence MSK-+ joint pain, muscle aches, injury Neuro- denies headache, dizziness, syncope, seizure activity       Objective:    BP 122/66   Pulse 82   Temp 97.6 F (36.4 C) (Oral)   Resp 16   Ht 6' (1.829 m)   Wt 181 lb (82.1 kg)   SpO2 95%   BMI 24.55 kg/m  GEN- NAD, alert and oriented x3 HEENT- PERRL, EOMI, non injected sclera, pink conjunctiva, MMM, oropharynx clear,TM clear, canals mild wax left side CVS- RRR, no murmur RESP-CTAB ABD-NABS,soft, mild TTP epigastric region, ND Spine- mild TTP thoracic region, no spasm, fair ROM  spine Ext- no edema          Assessment & Plan:      Problem List Items Addressed This Visit    Dyspepsia   Relevant Orders   Ambulatory referral to Gastroenterology   Duodenal ulcer - Primary    Continue aciphex, given phenergan for nausea Uses lesvin as needed  Send to new GI for further evaluation      Relevant Orders   Ambulatory referral to Gastroenterology   Chronic bilateral thoracic back pain    Chronic pain, has seen ortho, had imaging He can not take NSAIDS Tylenol does not helo Refilled Norco use sparingly,he also uses topical rubs      Relevant Medications   HYDROcodone-acetaminophen (NORCO) 5-325 MG tablet    Other Visit Diagnoses    Chronic diarrhea       Relevant Orders   Ambulatory referral to Gastroenterology   Impacted cerumen of left ear       very little wax, irrigated at bedside      Note: This dictation was prepared with Dragon dictation along with smaller phrase technology. Any transcriptional errors that result from this process are unintentional.

## 2017-02-12 NOTE — Patient Instructions (Signed)
Phenergan for nausea Hyocosamine for the spasms/diarrhea Hydrocodone for back pain as needed Referral to [facility] in [place] F/U as previous for Physical

## 2017-02-13 ENCOUNTER — Telehealth: Payer: Self-pay | Admitting: Gastroenterology

## 2017-02-13 LAB — FECAL LACTOFERRIN, QUANT: Lactoferrin: POSITIVE

## 2017-02-13 NOTE — Telephone Encounter (Signed)
Referral was sent for patient to be seen at [facility] for duodenal ulcer, dyspepsia and chronic diarrhea. Patient has been established with [facility] for years but now wishes to switch care due to not being satisfied with the service there. Last ov note from [facility] was on 3.14.18 and printed to be reviewed. Patient not requesting certain doctor so afternoon DOD 5.30.18 is Dr.Nandigam. Other previous gi records in epic.

## 2017-02-26 NOTE — Telephone Encounter (Signed)
Patient calling in regarding this.   Do you know if Dr. Silverio Decamp has reviewed records? Thanks.

## 2017-02-26 NOTE — Telephone Encounter (Signed)
Informed patient of this.  °

## 2017-02-26 NOTE — Telephone Encounter (Signed)
Dr Silverio Decamp Do you accept this pt?

## 2017-02-26 NOTE — Telephone Encounter (Signed)
Sorry cant accept transfer at this point. Thanks

## 2017-02-27 ENCOUNTER — Ambulatory Visit: Payer: Medicare Other | Admitting: Nurse Practitioner

## 2017-03-10 ENCOUNTER — Other Ambulatory Visit: Payer: Self-pay | Admitting: Family Medicine

## 2017-03-14 ENCOUNTER — Ambulatory Visit (INDEPENDENT_AMBULATORY_CARE_PROVIDER_SITE_OTHER): Payer: Medicare Other | Admitting: Family Medicine

## 2017-03-14 ENCOUNTER — Encounter: Payer: Self-pay | Admitting: Family Medicine

## 2017-03-14 VITALS — BP 128/64 | HR 100 | Temp 98.1°F | Resp 14 | Ht 72.0 in | Wt 189.0 lb

## 2017-03-14 DIAGNOSIS — N400 Enlarged prostate without lower urinary tract symptoms: Secondary | ICD-10-CM | POA: Diagnosis not present

## 2017-03-14 DIAGNOSIS — K269 Duodenal ulcer, unspecified as acute or chronic, without hemorrhage or perforation: Secondary | ICD-10-CM

## 2017-03-14 DIAGNOSIS — K219 Gastro-esophageal reflux disease without esophagitis: Secondary | ICD-10-CM

## 2017-03-14 DIAGNOSIS — R7303 Prediabetes: Secondary | ICD-10-CM

## 2017-03-14 DIAGNOSIS — Z0000 Encounter for general adult medical examination without abnormal findings: Secondary | ICD-10-CM

## 2017-03-14 NOTE — Patient Instructions (Addendum)
Referral to [facility]  F/U 6 months

## 2017-03-14 NOTE — Progress Notes (Signed)
Subjective:   Patient presents for Medicare Annual/Subsequent preventive examination.   He would like to have a new referral he is to set that he was not picked up by a new gastroenterologist yet. He continues to have chronic diarrhea yes's history of duodenal ulcer. Medications reviewed  Fasting labs reviewed- normal PSA     Review Past Medical/Family/Social: Per EMR   Risk Factors  Current exercise habits: walks Dietary issues discussed: Yes  Cardiac risk factors:   Depression Screen  (Note: if answer to either of the following is "Yes", a more complete depression screening is indicated)  Over the past two weeks, have you felt down, depressed or hopeless? No Over the past two weeks, have you felt little interest or pleasure in doing things? No Have you lost interest or pleasure in daily life? No Do you often feel hopeless? No Do you cry easily over simple problems? No   Activities of Daily Living  In your present state of health, do you have any difficulty performing the following activities?:  Driving? No  Managing money? No  Feeding yourself? No  Getting from bed to chair? No  Climbing a flight of stairs? No  Preparing food and eating?: No  Bathing or showering? No  Getting dressed: No  Getting to the toilet? No  Using the toilet:No  Moving around from place to place: No  In the past year have you fallen or had a near fall?:No  Are you sexually active? No  Do you have more than one partner? No   Hearing Difficulties: YES- WEARS HEARING AIDES Do you often ask people to speak up or repeat themselves?Yes Do you experience ringing or noises in your ears? Yes Do you have difficulty understanding soft or whispered voices? Yes Do you feel that you have a problem with memory? Yes Do you often misplace items? Yes Do you feel safe at home? Yes  Cognitive Testing  Alert? Yes Normal Appearance?Yes  Oriented to person? Yes Place? Yes  Time? Yes  Recall of three objects?  Yes  Can perform simple calculations? Yes  Displays appropriate judgment?Yes  Can read the correct time from a watch face?Yes   List the Names of Other Physician/Practitioners you currently use:   [employer]   Screening Tests / Date UTD Colonoscopy        UTD             Zostavax  UTD  Pneumonia UTD Influenza Vaccine  UTD Tetanus/tdap UTD  ROS: GEN- denies fatigue, fever, weight loss,weakness, recent illness HEENT- denies eye drainage, change in vision, nasal discharge, CVS- denies chest pain, palpitations RESP- denies SOB, cough, wheeze ABD- denies N/V, +change in stools, abd pain GU- denies dysuria, hematuria, dribbling, incontinence MSK- denies joint pain, muscle aches, injury Neuro- denies headache, dizziness, syncope, seizure activity  Physical: GEN- NAD, alert and oriented x3 HEENT- PERRL, EOMI, non injected sclera, pink conjunctiva, MMM, oropharynx clear, mild wax left canal  Neck- Supple, no thryomegaly CVS- RRR, no murmur RESP-CTAB ABD-NABS,softNT,ND Psych- normal affect and mood  EXT- No edema Pulses- Radial, DP- 2+    Assessment:    Annual wellness medicare exam   Plan:    During the course of the visit the patient was educated and counseled about appropriate screening and preventive services including:   Screen Neg for depression. PHQ- 9 score of 0      Discussed living will/advanced directives -    Recent labs in Feb revivewed normal PSA, LIPID, CBC, CMET  We'll send referral for second opinion for his chronic diarrhea and his duodenal ulcer. Continue current medications otherwise he is doing well with his Flomax for his BPH.  Mild cerumen removed at the bedside with irrigation   Diet review for nutrition referral? Yes ____ Not Indicated __x__  Patient Instructions (the written plan) was given to the patient.  Medicare Attestation  I have personally reviewed:  The patient's medical and social history  Their use of alcohol, tobacco or illicit  drugs  Their current medications and supplements  The patient's functional ability including ADLs,fall risks, home safety risks, cognitive, and hearing and visual impairment  Diet and physical activities  Evidence for depression or mood disorders  The patient's weight, height, BMI, and visual acuity have been recorded in the chart. I have made referrals, counseling, and provided education to the patient based on review of the above and I have provided the patient with a written personalized care plan for preventive services.

## 2017-03-17 NOTE — Assessment & Plan Note (Signed)
Blood sugar looks good, A1C 5.8%, no meds needed Cholesterol normal

## 2017-03-24 ENCOUNTER — Other Ambulatory Visit: Payer: Self-pay | Admitting: Gastroenterology

## 2017-03-29 ENCOUNTER — Emergency Department (HOSPITAL_COMMUNITY): Payer: Medicare Other

## 2017-03-29 ENCOUNTER — Encounter (HOSPITAL_COMMUNITY): Payer: Self-pay | Admitting: *Deleted

## 2017-03-29 ENCOUNTER — Emergency Department (HOSPITAL_COMMUNITY)
Admission: EM | Admit: 2017-03-29 | Discharge: 2017-03-30 | Disposition: A | Discharge status: Home / Self Care | Payer: Medicare Other | Attending: Emergency Medicine | Admitting: Emergency Medicine

## 2017-03-29 DIAGNOSIS — S61210A Laceration without foreign body of right index finger without damage to nail, initial encounter: Secondary | ICD-10-CM | POA: Insufficient documentation

## 2017-03-29 DIAGNOSIS — W25XXXA Contact with sharp glass, initial encounter: Secondary | ICD-10-CM | POA: Diagnosis not present

## 2017-03-29 DIAGNOSIS — Z23 Encounter for immunization: Secondary | ICD-10-CM | POA: Diagnosis not present

## 2017-03-29 DIAGNOSIS — Z87891 Personal history of nicotine dependence: Secondary | ICD-10-CM | POA: Diagnosis not present

## 2017-03-29 DIAGNOSIS — Y929 Unspecified place or not applicable: Secondary | ICD-10-CM | POA: Insufficient documentation

## 2017-03-29 DIAGNOSIS — M79644 Pain in right finger(s): Secondary | ICD-10-CM | POA: Diagnosis not present

## 2017-03-29 DIAGNOSIS — Y9389 Activity, other specified: Secondary | ICD-10-CM | POA: Insufficient documentation

## 2017-03-29 DIAGNOSIS — J449 Chronic obstructive pulmonary disease, unspecified: Secondary | ICD-10-CM | POA: Insufficient documentation

## 2017-03-29 DIAGNOSIS — Y999 Unspecified external cause status: Secondary | ICD-10-CM | POA: Insufficient documentation

## 2017-03-29 DIAGNOSIS — S6991XA Unspecified injury of right wrist, hand and finger(s), initial encounter: Secondary | ICD-10-CM | POA: Diagnosis not present

## 2017-03-29 MED ORDER — LIDOCAINE HCL (PF) 1 % IJ SOLN
5.0000 mL | Freq: Once | INTRAMUSCULAR | Status: AC
  Administered 2017-03-30: 5 mL
  Filled 2017-03-29: qty 5

## 2017-03-29 MED ORDER — TETANUS-DIPHTH-ACELL PERTUSSIS 5-2.5-18.5 LF-MCG/0.5 IM SUSP
0.5000 mL | Freq: Once | INTRAMUSCULAR | Status: AC
  Administered 2017-03-30: 0.5 mL via INTRAMUSCULAR
  Filled 2017-03-29: qty 0.5

## 2017-03-29 NOTE — ED Triage Notes (Signed)
Pt washing a glass and exploded and pt believes glass still in right hand
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
## 2017-03-30 DIAGNOSIS — S61210A Laceration without foreign body of right index finger without damage to nail, initial encounter: Secondary | ICD-10-CM | POA: Diagnosis not present

## 2017-03-30 MED ORDER — BACITRACIN ZINC 500 UNIT/GM EX OINT
TOPICAL_OINTMENT | CUTANEOUS | Status: AC
  Administered 2017-03-30: 1
  Filled 2017-03-30: qty 0.9

## 2017-03-30 NOTE — Discharge Instructions (Signed)
Keep the wound clean and dry. You can use triple antibiotic ointment on the wound. You can take acetaminophen for pain if needed. The sutures need to be removed in 10-12 days. Recheck sooner if it looks infected (increased redness, swelling, pain, drains pus, you see a red streak run up your hand from the wound).

## 2017-03-30 NOTE — ED Provider Notes (Signed)
THIS IS A SHARED VISIT WITH DR. IVA KNAPP.  Benjamin [name] is a 76 y.o. male with laceration to the right index finger.   Dg Finger Index Right  Result Date: 03/29/2017 CLINICAL DATA:  Cut finger with glass EXAM: RIGHT INDEX FINGER 2+V COMPARISON:  None. FINDINGS: No fracture or malalignment. Diffuse soft tissue swelling. No radiopaque foreign body is seen. IMPRESSION: No acute osseous abnormality.  No definite radiopaque foreign body. Electronically Signed   By: Donavan Foil M.D.   On: 03/29/2017 22:32    PROCEDURE NOTE:  LACERATION REPAIR Performed by: Debroah Baller Authorized by: NEESE,HOPE Consent: Verbal consent obtained. Risks and benefits: risks, benefits and alternatives were discussed Consent given by: patient Patient identity confirmed: provided demographic data Prepped and Draped in normal sterile fashion Wound explored  Laceration Location: right index finger, flap laceration  Flap elevated and examined no foreign body identified  Laceration Length: 1.5 cm  Anesthesia: local infiltration  Local anesthetic: lidocaine 1% without epinephrine  Anesthetic total: 2 ml Irrigated with NSS] Irrigation method: syringe Amount of cleaning: standard  Skin closure: 5-0 nylon  Number of sutures: 3  Technique: interrupted  Patient tolerance: Patient tolerated the procedure well with no immediate complications.  Dressing applied Tetanus updated    Debroah Baller [place], NP 03/30/17 [place], [place], MD 03/30/17 [phone]

## 2017-04-07 ENCOUNTER — Ambulatory Visit: Payer: Medicare Other | Admitting: Family Medicine

## 2017-04-14 ENCOUNTER — Encounter: Payer: Self-pay | Admitting: Family Medicine

## 2017-04-25 ENCOUNTER — Other Ambulatory Visit: Payer: Self-pay | Admitting: Family Medicine

## 2017-04-25 DIAGNOSIS — K269 Duodenal ulcer, unspecified as acute or chronic, without hemorrhage or perforation: Secondary | ICD-10-CM

## 2017-04-25 DIAGNOSIS — K219 Gastro-esophageal reflux disease without esophagitis: Secondary | ICD-10-CM

## 2017-05-01 ENCOUNTER — Other Ambulatory Visit: Payer: Self-pay | Admitting: Gastroenterology

## 2017-05-01 ENCOUNTER — Ambulatory Visit
Admission: RE | Admit: 2017-05-01 | Discharge: 2017-05-01 | Disposition: A | Discharge status: Home / Self Care | Payer: Medicare Other | Source: Ambulatory Visit | Attending: Gastroenterology | Admitting: Gastroenterology

## 2017-05-01 DIAGNOSIS — K209 Esophagitis, unspecified: Secondary | ICD-10-CM | POA: Diagnosis not present

## 2017-05-01 DIAGNOSIS — R197 Diarrhea, unspecified: Secondary | ICD-10-CM | POA: Diagnosis not present

## 2017-05-01 DIAGNOSIS — K269 Duodenal ulcer, unspecified as acute or chronic, without hemorrhage or perforation: Secondary | ICD-10-CM | POA: Diagnosis not present

## 2017-05-01 DIAGNOSIS — R109 Unspecified abdominal pain: Secondary | ICD-10-CM | POA: Diagnosis not present

## 2017-05-05 ENCOUNTER — Telehealth: Payer: Self-pay | Admitting: Family Medicine

## 2017-05-05 NOTE — Telephone Encounter (Signed)
Received call from Dr. Alessandra Bevels, with [facility].   Was advised that patient was started on Bentyl and Colestid, but reported increased loss of balance and falls and ABD pain. Medications were stopped, and patient advised to F/U with PCP in case loss of balance is not D/T anticholinergic effects.   MD aware.

## 2017-05-05 NOTE — Telephone Encounter (Signed)
Call placed to patient to inquire.   Patient wife states that he saw GI and medications were changed. States that since change has occurred, patient is having difficulty with balance and urinating.   Appointment scheduled.

## 2017-05-05 NOTE — Telephone Encounter (Signed)
New Message  Pt c/o medication issue:  1. Name of Medication: dicyclonine 10 mg colpstipol 1 gram  2. How are you currently taking this medication (dosage and times per day)? See above  3. Are you having a reaction (difficulty breathing--STAT)? N/A  4. What is your medication issue? Pt voiced has problems using restroom from taking these medications. Pt voiced he spoke with Gertie Fey and they advised him to immediately stop taking these medications.  Pt voiced he also fell by not using medication and also advised me Gertie Fey MD advised pt to contact PCP to see about what medications he should take instead.  Please /fu

## 2017-05-05 NOTE — Telephone Encounter (Signed)
Call placed to [facility].   Notes from last visit requested.   Requested call from nurse to discuss patient concerns.

## 2017-05-05 NOTE — Telephone Encounter (Signed)
Please get the notes from GI, also verify with them he called about side effects and they told him to come to PCP ?

## 2017-05-06 ENCOUNTER — Ambulatory Visit (INDEPENDENT_AMBULATORY_CARE_PROVIDER_SITE_OTHER): Payer: Medicare Other | Admitting: Family Medicine

## 2017-05-06 ENCOUNTER — Emergency Department (HOSPITAL_COMMUNITY): Payer: Medicare Other

## 2017-05-06 ENCOUNTER — Inpatient Hospital Stay (HOSPITAL_COMMUNITY): Payer: Medicare Other

## 2017-05-06 ENCOUNTER — Encounter (HOSPITAL_COMMUNITY): Payer: Self-pay | Admitting: Emergency Medicine

## 2017-05-06 ENCOUNTER — Encounter: Payer: Self-pay | Admitting: Family Medicine

## 2017-05-06 ENCOUNTER — Inpatient Hospital Stay (HOSPITAL_COMMUNITY)
Admission: EM | Admit: 2017-05-06 | Discharge: 2017-05-11 | DRG: 871 | Disposition: A | Discharge status: Home Health | Payer: Medicare Other | Attending: Internal Medicine | Admitting: Internal Medicine

## 2017-05-06 VITALS — BP 122/78 | HR 122 | Temp 98.0°F | Resp 26

## 2017-05-06 DIAGNOSIS — Z9841 Cataract extraction status, right eye: Secondary | ICD-10-CM

## 2017-05-06 DIAGNOSIS — N179 Acute kidney failure, unspecified: Secondary | ICD-10-CM | POA: Diagnosis not present

## 2017-05-06 DIAGNOSIS — R0902 Hypoxemia: Secondary | ICD-10-CM

## 2017-05-06 DIAGNOSIS — R05 Cough: Secondary | ICD-10-CM | POA: Diagnosis not present

## 2017-05-06 DIAGNOSIS — R7881 Bacteremia: Secondary | ICD-10-CM

## 2017-05-06 DIAGNOSIS — Z515 Encounter for palliative care: Secondary | ICD-10-CM

## 2017-05-06 DIAGNOSIS — A419 Sepsis, unspecified organism: Secondary | ICD-10-CM | POA: Diagnosis not present

## 2017-05-06 DIAGNOSIS — I959 Hypotension, unspecified: Secondary | ICD-10-CM | POA: Diagnosis not present

## 2017-05-06 DIAGNOSIS — R0603 Acute respiratory distress: Secondary | ICD-10-CM

## 2017-05-06 DIAGNOSIS — J9601 Acute respiratory failure with hypoxia: Secondary | ICD-10-CM | POA: Diagnosis not present

## 2017-05-06 DIAGNOSIS — E7439 Other disorders of intestinal carbohydrate absorption: Secondary | ICD-10-CM

## 2017-05-06 DIAGNOSIS — R6520 Severe sepsis without septic shock: Secondary | ICD-10-CM | POA: Diagnosis present

## 2017-05-06 DIAGNOSIS — N400 Enlarged prostate without lower urinary tract symptoms: Secondary | ICD-10-CM | POA: Diagnosis not present

## 2017-05-06 DIAGNOSIS — I313 Pericardial effusion (noninflammatory): Secondary | ICD-10-CM | POA: Diagnosis present

## 2017-05-06 DIAGNOSIS — J441 Chronic obstructive pulmonary disease with (acute) exacerbation: Secondary | ICD-10-CM | POA: Diagnosis not present

## 2017-05-06 DIAGNOSIS — E874 Mixed disorder of acid-base balance: Secondary | ICD-10-CM | POA: Diagnosis not present

## 2017-05-06 DIAGNOSIS — I2510 Atherosclerotic heart disease of native coronary artery without angina pectoris: Secondary | ICD-10-CM

## 2017-05-06 DIAGNOSIS — R778 Other specified abnormalities of plasma proteins: Secondary | ICD-10-CM

## 2017-05-06 DIAGNOSIS — J440 Chronic obstructive pulmonary disease with acute lower respiratory infection: Secondary | ICD-10-CM | POA: Diagnosis not present

## 2017-05-06 DIAGNOSIS — Z961 Presence of intraocular lens: Secondary | ICD-10-CM | POA: Diagnosis present

## 2017-05-06 DIAGNOSIS — Z91041 Radiographic dye allergy status: Secondary | ICD-10-CM

## 2017-05-06 DIAGNOSIS — E118 Type 2 diabetes mellitus with unspecified complications: Secondary | ICD-10-CM | POA: Diagnosis not present

## 2017-05-06 DIAGNOSIS — R1031 Right lower quadrant pain: Secondary | ICD-10-CM | POA: Diagnosis not present

## 2017-05-06 DIAGNOSIS — J410 Simple chronic bronchitis: Secondary | ICD-10-CM | POA: Diagnosis not present

## 2017-05-06 DIAGNOSIS — G43001 Migraine without aura, not intractable, with status migrainosus: Secondary | ICD-10-CM | POA: Diagnosis not present

## 2017-05-06 DIAGNOSIS — J449 Chronic obstructive pulmonary disease, unspecified: Secondary | ICD-10-CM | POA: Diagnosis present

## 2017-05-06 DIAGNOSIS — R000 Tachycardia, unspecified: Secondary | ICD-10-CM

## 2017-05-06 DIAGNOSIS — T887XXA Unspecified adverse effect of drug or medicament, initial encounter: Secondary | ICD-10-CM

## 2017-05-06 DIAGNOSIS — R0689 Other abnormalities of breathing: Secondary | ICD-10-CM | POA: Diagnosis not present

## 2017-05-06 DIAGNOSIS — Z87891 Personal history of nicotine dependence: Secondary | ICD-10-CM | POA: Diagnosis not present

## 2017-05-06 DIAGNOSIS — M25551 Pain in right hip: Secondary | ICD-10-CM

## 2017-05-06 DIAGNOSIS — R7989 Other specified abnormal findings of blood chemistry: Secondary | ICD-10-CM

## 2017-05-06 DIAGNOSIS — R0602 Shortness of breath: Secondary | ICD-10-CM | POA: Diagnosis not present

## 2017-05-06 DIAGNOSIS — J189 Pneumonia, unspecified organism: Secondary | ICD-10-CM | POA: Diagnosis not present

## 2017-05-06 DIAGNOSIS — N390 Urinary tract infection, site not specified: Secondary | ICD-10-CM | POA: Diagnosis not present

## 2017-05-06 DIAGNOSIS — M25552 Pain in left hip: Secondary | ICD-10-CM

## 2017-05-06 DIAGNOSIS — B9620 Unspecified Escherichia coli [E. coli] as the cause of diseases classified elsewhere: Secondary | ICD-10-CM

## 2017-05-06 DIAGNOSIS — Z86010 Personal history of colonic polyps: Secondary | ICD-10-CM | POA: Diagnosis not present

## 2017-05-06 DIAGNOSIS — Z888 Allergy status to other drugs, medicaments and biological substances status: Secondary | ICD-10-CM | POA: Diagnosis not present

## 2017-05-06 DIAGNOSIS — K760 Fatty (change of) liver, not elsewhere classified: Secondary | ICD-10-CM | POA: Diagnosis not present

## 2017-05-06 DIAGNOSIS — E119 Type 2 diabetes mellitus without complications: Secondary | ICD-10-CM | POA: Diagnosis not present

## 2017-05-06 DIAGNOSIS — J439 Emphysema, unspecified: Secondary | ICD-10-CM | POA: Diagnosis not present

## 2017-05-06 DIAGNOSIS — R339 Retention of urine, unspecified: Secondary | ICD-10-CM | POA: Diagnosis not present

## 2017-05-06 DIAGNOSIS — R197 Diarrhea, unspecified: Secondary | ICD-10-CM | POA: Diagnosis not present

## 2017-05-06 DIAGNOSIS — K589 Irritable bowel syndrome without diarrhea: Secondary | ICD-10-CM | POA: Diagnosis not present

## 2017-05-06 DIAGNOSIS — R7303 Prediabetes: Secondary | ICD-10-CM | POA: Diagnosis present

## 2017-05-06 DIAGNOSIS — R109 Unspecified abdominal pain: Secondary | ICD-10-CM | POA: Diagnosis not present

## 2017-05-06 DIAGNOSIS — R296 Repeated falls: Secondary | ICD-10-CM | POA: Diagnosis present

## 2017-05-06 DIAGNOSIS — K580 Irritable bowel syndrome with diarrhea: Secondary | ICD-10-CM

## 2017-05-06 DIAGNOSIS — I5032 Chronic diastolic (congestive) heart failure: Secondary | ICD-10-CM | POA: Diagnosis not present

## 2017-05-06 DIAGNOSIS — R0682 Tachypnea, not elsewhere classified: Secondary | ICD-10-CM | POA: Diagnosis not present

## 2017-05-06 DIAGNOSIS — K529 Noninfective gastroenteritis and colitis, unspecified: Secondary | ICD-10-CM | POA: Diagnosis not present

## 2017-05-06 DIAGNOSIS — J431 Panlobular emphysema: Secondary | ICD-10-CM | POA: Diagnosis not present

## 2017-05-06 DIAGNOSIS — R55 Syncope and collapse: Secondary | ICD-10-CM | POA: Diagnosis not present

## 2017-05-06 DIAGNOSIS — R748 Abnormal levels of other serum enzymes: Secondary | ICD-10-CM | POA: Diagnosis not present

## 2017-05-06 DIAGNOSIS — J4489 Other specified chronic obstructive pulmonary disease: Secondary | ICD-10-CM | POA: Diagnosis present

## 2017-05-06 DIAGNOSIS — R0609 Other forms of dyspnea: Secondary | ICD-10-CM | POA: Diagnosis not present

## 2017-05-06 DIAGNOSIS — A4150 Gram-negative sepsis, unspecified: Secondary | ICD-10-CM | POA: Diagnosis not present

## 2017-05-06 LAB — COMPREHENSIVE METABOLIC PANEL
ALK PHOS: 68 U/L (ref 38–126)
ALT: 15 U/L — ABNORMAL LOW (ref 17–63)
ANION GAP: 14 (ref 5–15)
AST: 29 U/L (ref 15–41)
Albumin: 3.5 g/dL (ref 3.5–5.0)
BILIRUBIN TOTAL: 1.8 mg/dL — AB (ref 0.3–1.2)
BUN: 16 mg/dL (ref 6–20)
CALCIUM: 8.8 mg/dL — AB (ref 8.9–10.3)
CO2: 19 mmol/L — ABNORMAL LOW (ref 22–32)
Chloride: 105 mmol/L (ref 101–111)
Creatinine, Ser: 1.16 mg/dL (ref 0.61–1.24)
GFR calc Af Amer: >60 mL/min (ref >60)
GFR calc non Af Amer: 60 mL/min — ABNORMAL LOW (ref >60)
Glucose, Bld: 115 mg/dL — ABNORMAL HIGH (ref 65–99)
POTASSIUM: 4 mmol/L (ref 3.5–5.1)
SODIUM: 138 mmol/L (ref 135–145)
TOTAL PROTEIN: 6.9 g/dL (ref 6.5–8.1)

## 2017-05-06 LAB — URINALYSIS, ROUTINE W REFLEX MICROSCOPIC
BILIRUBIN URINE: NEGATIVE
Bilirubin Urine: NEGATIVE
GLUCOSE, UA: NEGATIVE mg/dL
Glucose, UA: NEGATIVE
Ketones, ur: NEGATIVE
Ketones, ur: NEGATIVE mg/dL
NITRITE: POSITIVE — AB
Nitrite: POSITIVE — AB
PH: 6 (ref 5.0–8.0)
PROTEIN: 100 mg/dL — AB
Specific Gravity, Urine: 1.025 (ref 1.005–1.030)
pH: 5 (ref 5.0–8.0)

## 2017-05-06 LAB — URINALYSIS, MICROSCOPIC ONLY
CASTS: NONE SEEN [LPF]
CRYSTALS: NONE SEEN [HPF]
Squamous Epithelial / LPF: NONE SEEN [HPF] (ref <=5)
Yeast: NONE SEEN [HPF]

## 2017-05-06 LAB — CBC WITH DIFFERENTIAL/PLATELET
BASOS ABS: 0 10*3/uL (ref 0.0–0.1)
Basophils Relative: 0 %
EOS ABS: 0 10*3/uL (ref 0.0–0.7)
EOS PCT: 0 %
HCT: 44.4 % (ref 39.0–52.0)
Hemoglobin: 15.5 g/dL (ref 13.0–17.0)
Lymphocytes Relative: 5 %
Lymphs Abs: 0.7 10*3/uL (ref 0.7–4.0)
MCH: 31.6 pg (ref 26.0–34.0)
MCHC: 34.9 g/dL (ref 30.0–36.0)
MCV: 90.6 fL (ref 78.0–100.0)
Monocytes Absolute: 1.2 10*3/uL — ABNORMAL HIGH (ref 0.1–1.0)
Monocytes Relative: 8 %
Neutro Abs: 13.1 10*3/uL — ABNORMAL HIGH (ref 1.7–7.7)
Neutrophils Relative %: 87 %
PLATELETS: 193 10*3/uL (ref 150–400)
RBC: 4.9 MIL/uL (ref 4.22–5.81)
RDW: 13.8 % (ref 11.5–15.5)
WBC: 15 10*3/uL — AB (ref 4.0–10.5)

## 2017-05-06 LAB — I-STAT CG4 LACTIC ACID, ED
LACTIC ACID, VENOUS: 1.65 mmol/L (ref 0.5–1.9)
Lactic Acid, Venous: 3.1 mmol/L (ref 0.5–1.9)

## 2017-05-06 LAB — I-STAT TROPONIN, ED: Troponin i, poc: 0.02 ng/mL (ref 0.00–0.08)

## 2017-05-06 LAB — GLUCOSE, FINGERSTICK (STAT): Glucose, fingerstick: 174 mg/dL — ABNORMAL HIGH (ref 65–99)

## 2017-05-06 LAB — LIPASE, BLOOD: Lipase: 21 U/L (ref 11–51)

## 2017-05-06 LAB — PROTIME-INR
INR: 1.18
Prothrombin Time: 15.1 seconds (ref 11.4–15.2)

## 2017-05-06 MED ORDER — PANTOPRAZOLE SODIUM 40 MG PO TBEC: 40.0000 mg | DELAYED_RELEASE_TABLET | Freq: Every day | ORAL | Status: DC

## 2017-05-06 MED ORDER — SODIUM CHLORIDE 0.9 % IV SOLN
INTRAVENOUS | Status: DC
  Administered 2017-05-07: via INTRAVENOUS

## 2017-05-06 MED ORDER — ACETAMINOPHEN 500 MG PO TABS
1000.0000 mg | ORAL_TABLET | Freq: Once | ORAL | Status: AC
  Administered 2017-05-06: 1000 mg via ORAL
  Filled 2017-05-06: qty 2

## 2017-05-06 MED ORDER — SODIUM CHLORIDE 0.9 % IV BOLUS (SEPSIS)
1500.0000 mL | Freq: Once | INTRAVENOUS | Status: AC
  Administered 2017-05-06: 1500 mL via INTRAVENOUS

## 2017-05-06 MED ORDER — ONDANSETRON HCL 4 MG/2ML IJ SOLN
4.0000 mg | Freq: Once | INTRAMUSCULAR | Status: AC
  Administered 2017-05-06: 4 mg via INTRAVENOUS
  Filled 2017-05-06: qty 2

## 2017-05-06 MED ORDER — ACETAMINOPHEN 650 MG RE SUPP
650.0000 mg | Freq: Four times a day (QID) | RECTAL | Status: DC | PRN
  Administered 2017-05-07: 650 mg via RECTAL
  Filled 2017-05-06: qty 1

## 2017-05-06 MED ORDER — MORPHINE SULFATE (PF) 2 MG/ML IV SOLN
2.0000 mg | INTRAVENOUS | Status: DC | PRN
  Administered 2017-05-07: 2 mg via INTRAVENOUS
  Filled 2017-05-06: qty 1

## 2017-05-06 MED ORDER — VANCOMYCIN HCL IN DEXTROSE 1-5 GM/200ML-% IV SOLN
1000.0000 mg | Freq: Once | INTRAVENOUS | Status: AC
  Administered 2017-05-06: 1000 mg via INTRAVENOUS
  Filled 2017-05-06: qty 200

## 2017-05-06 MED ORDER — ONDANSETRON HCL 4 MG/2ML IJ SOLN
4.0000 mg | Freq: Four times a day (QID) | INTRAMUSCULAR | Status: DC | PRN
  Administered 2017-05-11: 4 mg via INTRAVENOUS
  Filled 2017-05-06: qty 2

## 2017-05-06 MED ORDER — TAMSULOSIN HCL 0.4 MG PO CAPS
0.4000 mg | ORAL_CAPSULE | Freq: Every day | ORAL | Status: DC
  Administered 2017-05-07 – 2017-05-10 (×4): 0.4 mg via ORAL
  Filled 2017-05-06 (×5): qty 1

## 2017-05-06 MED ORDER — PIPERACILLIN-TAZOBACTAM 3.375 G IVPB 30 MIN
3.3750 g | Freq: Once | INTRAVENOUS | Status: AC
  Administered 2017-05-06: 3.375 g via INTRAVENOUS
  Filled 2017-05-06: qty 50

## 2017-05-06 MED ORDER — ONDANSETRON HCL 4 MG PO TABS: 4.0000 mg | ORAL_TABLET | Freq: Four times a day (QID) | ORAL | Status: DC | PRN

## 2017-05-06 MED ORDER — ENOXAPARIN SODIUM 40 MG/0.4ML ~~LOC~~ SOLN
40.0000 mg | SUBCUTANEOUS | Status: DC
  Administered 2017-05-07 – 2017-05-11 (×4): 40 mg via SUBCUTANEOUS
  Filled 2017-05-06 (×5): qty 0.4

## 2017-05-06 MED ORDER — FAMOTIDINE 20 MG PO TABS: 20.0000 mg | ORAL_TABLET | Freq: Every day | ORAL | Status: DC | PRN

## 2017-05-06 MED ORDER — PROMETHAZINE HCL 25 MG PO TABS: 12.5000 mg | ORAL_TABLET | Freq: Three times a day (TID) | ORAL | Status: DC | PRN

## 2017-05-06 MED ORDER — ACETAMINOPHEN 325 MG PO TABS
650.0000 mg | ORAL_TABLET | Freq: Four times a day (QID) | ORAL | Status: DC | PRN
  Administered 2017-05-07 – 2017-05-11 (×5): 650 mg via ORAL
  Filled 2017-05-06 (×5): qty 2

## 2017-05-06 MED ORDER — INSULIN ASPART 100 UNIT/ML ~~LOC~~ SOLN: 0.0000 [IU] | Freq: Three times a day (TID) | SUBCUTANEOUS | Status: DC

## 2017-05-06 MED ORDER — PROMETHAZINE HCL 25 MG PO TABS
12.5000 mg | ORAL_TABLET | Freq: Three times a day (TID) | ORAL | Status: DC | PRN
  Administered 2017-05-07: 12.5 mg via ORAL
  Filled 2017-05-06: qty 1

## 2017-05-06 MED ORDER — DEXTROSE 5 % IV SOLN
2.0000 g | INTRAVENOUS | Status: DC
  Administered 2017-05-07: 2 g via INTRAVENOUS
  Filled 2017-05-06 (×2): qty 2

## 2017-05-06 MED ORDER — HYDROCODONE-ACETAMINOPHEN 5-325 MG PO TABS
1.0000 | ORAL_TABLET | Freq: Four times a day (QID) | ORAL | Status: DC | PRN
  Administered 2017-05-07: 1 via ORAL
  Filled 2017-05-06: qty 1

## 2017-05-06 MED ORDER — SODIUM CHLORIDE 0.9 % IV BOLUS (SEPSIS)
1000.0000 mL | Freq: Once | INTRAVENOUS | Status: AC
  Administered 2017-05-06: 1000 mL via INTRAVENOUS

## 2017-05-06 NOTE — ED Notes (Signed)
Admitting at bedside. Patient vomited after eating a little bit of his dinner - MD notified, gave VO for 4mg  IV zofran.

## 2017-05-06 NOTE — ED Notes (Signed)
Admitting MD at bedside.
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
## 2017-05-06 NOTE — Progress Notes (Signed)
Pharmacy Antibiotic Note  Benjamin [name] is a 76 y.o. male admitted on 05/06/2017 with sepsis and UTI.  Pharmacy has been consulted for Ceftriaxone dosing. WBC 15. Lactic acid trending down.   Plan: -Ceftriaxone 2g IV q24h -Trend WBC, temp, renal function  -F/U cultures   Height: 5\' 11"  (180.3 cm) Weight: 190 lb 9.6 oz (86.5 kg) IBW/kg (Calculated) : 75.3  Temp (24hrs), Avg:99.3 F (37.4 C), Min:98 F (36.7 C), Max:102.4 F (39.1 C)   Recent Labs Lab 05/06/17 1613 05/06/17 1622 05/06/17 1942  WBC 15.0*  --   --   CREATININE 1.16  --   --   LATICACIDVEN  --  3.10* 1.65    Estimated Creatinine Clearance: 58.6 mL/min (by C-G formula based on SCr of 1.16 mg/dL).    Allergies  Allergen Reactions  . Arsenic     Severe swelling if patient comes in contact   . Contrast Media [Iodinated Diagnostic Agents]     Narda Bonds 05/06/2017 11:43 PM

## 2017-05-06 NOTE — Patient Instructions (Signed)
EMS called to transport patient

## 2017-05-06 NOTE — Progress Notes (Signed)
Subjective:    Patient ID: Benjamin [name], male    DOB: 05-09-1941, 76 y.o.   MRN: [id number]  Patient presents for Illness (patient noted outside with pre syncope like Sx- O2 92-93% RA, breathing heavy and shaking- CBG 174) Patient presents with acute illness. He was placed on Colestid as well as well as dicyclomine by gastroenterology for his chronic diarrhea after a few doses he began to feel weak he had 3 episodes where he fell along with dizziness he has not been able to urinate normally  or have a good bowel movement. Today as he was coming into the office he had a presyncopal that which was witnessed his oxygen dropped down to 89% and he began breathing very heavy and shaking all over. Wife said he had another episode like this a few hours ago they tried albuterol on him but it did not help. He has not had any loss of consciousness. He is also had some burning with urination when he can get some of the urine out. He's not had any known fever.   When I saw him he was sitting in the well chair he was lucid and able to my questions by shaking profusely all over. His oxygen sat was 89% he was breathing very heavily. We will able to get him up onto the table CBG was 174 blood pressure was repeated was 132/80. His EKG showed sinus tachycardia we had to run at a few times because of the shaking one did have some PVCs. His sat came up to 93% with 1 L and he felt more comfortable with his breathing.  Urinalysis was brought in by his wife which does show concern for urinary tract infection as well.    Review Of Systems:  GEN- + fatigue, fever, weight loss,+weakness, recent illness HEENT- denies eye drainage, change in vision, nasal discharge, CVS- denies chest pain, palpitations RESP- denies SOB, cough, wheeze ABD- denies N/V, change in stools, abd pain GU- denies dysuria, hematuria, dribbling, incontinence MSK- denies joint pain, muscle aches, injury Neuro- denies headache, +dizziness, syncope,  seizure activity       Objective:    BP 122/78   Pulse (!) 122   Temp 98 F (36.7 C) (Oral)   Resp (!) 26   SpO2 93%  GEN- NAD, alert and oriented x3, shaking all over HEENT- PERRL, EOMI, non injected sclera, pink conjunctiva, MMM, oropharynx clear Neck- Supple, no thyromegaly CVS-tachycardic , no murmur RESP-increased WOB, no wheeze, no rales  ABD-NABS,soft, mild TTP suprapubic region, no CVA tenderness  EXT- No edema Pulses- Radial, DP- 2+        Assessment & Plan:      Problem List Items Addressed This Visit    None    Visit Diagnoses    Pre-syncope    -  Primary   Needs cardiac r/u in setting of hypoxia, also with infection, send to ER   Relevant Orders   Glucose, fingerstick (stat) (Completed)   EKG 12-Lead (Completed)   Tachycardia       He definitely has some underlying anxiety, and hyperventilating with sensation of not being able to breathe. EKG sinus tach, with his falls, recent medication    Urinary tract infection without hematuria, site unspecified       UTI noted on UA, culture was sent from our office, no fever, but with his shaking, falls, hypoxia, possible SIRS/Sepsis   Relevant Orders   Urinalysis, Routine w reflex microscopic (Completed)   Medication side  effect       Bentyl likely caused the urinary retention and dizziness, this may have lead to UTI seen today , he is off the medication for past 48 hours    Hypoxia       O2 sat improved with 1 L oxygen      Note: This dictation was prepared with Dragon dictation along with smaller phrase technology. Any transcriptional errors that result from this process are unintentional.

## 2017-05-06 NOTE — ED Notes (Signed)
Bladder scan detected 33cc urine. Done twice. MD notified.
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
## 2017-05-06 NOTE — ED Triage Notes (Signed)
Per [facility]: Pt to ED from PCP office for possible sepsis. Pt tachypneic, tachycardic, RR 40s, fevers at home, 100F with EMS. Pt c/o non-productive cough, burning with urination, incontinent at MD office. CBG 178, 137/63, HR 120 ST, 95% RA. COPD. Pt denies dizziness/N/V. Resp labored, breath sounds clear bilaterally. Skin pale, dry.

## 2017-05-06 NOTE — Progress Notes (Signed)
Pt arrived to [place]. Alert and oriented x 4, ambulated from stretcher to bed. VS collected, RR increased, MD aware, no signs of acte distress.  Pt identified, oriented to room and unit, bed alarm activated.  Pt instructed to use call bell for assistance, call bell within reach. Will continue to monitor and treat pt per MD orders.

## 2017-05-06 NOTE — ED Notes (Signed)
Patient transported to CT 

## 2017-05-07 ENCOUNTER — Inpatient Hospital Stay (HOSPITAL_COMMUNITY): Payer: Medicare Other

## 2017-05-07 DIAGNOSIS — N179 Acute kidney failure, unspecified: Secondary | ICD-10-CM

## 2017-05-07 DIAGNOSIS — N390 Urinary tract infection, site not specified: Secondary | ICD-10-CM

## 2017-05-07 DIAGNOSIS — R0689 Other abnormalities of breathing: Secondary | ICD-10-CM

## 2017-05-07 DIAGNOSIS — R6520 Severe sepsis without septic shock: Secondary | ICD-10-CM

## 2017-05-07 DIAGNOSIS — A4150 Gram-negative sepsis, unspecified: Secondary | ICD-10-CM

## 2017-05-07 DIAGNOSIS — A419 Sepsis, unspecified organism: Principal | ICD-10-CM

## 2017-05-07 DIAGNOSIS — R778 Other specified abnormalities of plasma proteins: Secondary | ICD-10-CM

## 2017-05-07 DIAGNOSIS — I2510 Atherosclerotic heart disease of native coronary artery without angina pectoris: Secondary | ICD-10-CM

## 2017-05-07 DIAGNOSIS — R7989 Other specified abnormal findings of blood chemistry: Secondary | ICD-10-CM

## 2017-05-07 DIAGNOSIS — R748 Abnormal levels of other serum enzymes: Secondary | ICD-10-CM

## 2017-05-07 LAB — BLOOD CULTURE ID PANEL (REFLEXED)
Acinetobacter baumannii: NOT DETECTED
CANDIDA GLABRATA: NOT DETECTED
CANDIDA KRUSEI: NOT DETECTED
CANDIDA TROPICALIS: NOT DETECTED
CARBAPENEM RESISTANCE: NOT DETECTED
Candida albicans: NOT DETECTED
Candida parapsilosis: NOT DETECTED
ENTEROCOCCUS SPECIES: NOT DETECTED
ESCHERICHIA COLI: DETECTED — AB
Enterobacter cloacae complex: NOT DETECTED
Enterobacteriaceae species: DETECTED — AB
HAEMOPHILUS INFLUENZAE: NOT DETECTED
Klebsiella oxytoca: NOT DETECTED
Klebsiella pneumoniae: NOT DETECTED
LISTERIA MONOCYTOGENES: NOT DETECTED
METHICILLIN RESISTANCE: NOT DETECTED
Neisseria meningitidis: NOT DETECTED
PROTEUS SPECIES: NOT DETECTED
Pseudomonas aeruginosa: NOT DETECTED
SERRATIA MARCESCENS: NOT DETECTED
STREPTOCOCCUS PYOGENES: NOT DETECTED
Staphylococcus aureus (BCID): NOT DETECTED
Staphylococcus species: NOT DETECTED
Streptococcus agalactiae: NOT DETECTED
Streptococcus pneumoniae: NOT DETECTED
Streptococcus species: NOT DETECTED
Vancomycin resistance: NOT DETECTED

## 2017-05-07 LAB — PROCALCITONIN
PROCALCITONIN: 30.15 ng/mL
Procalcitonin: 2.46 ng/mL

## 2017-05-07 LAB — BLOOD GAS, ARTERIAL
ACID-BASE DEFICIT: 5 mmol/L — AB (ref 0.0–2.0)
Acid-base deficit: 11 mmol/L — ABNORMAL HIGH (ref 0.0–2.0)
BICARBONATE: 12.8 mmol/L — AB (ref 20.0–28.0)
BICARBONATE: 17.7 mmol/L — AB (ref 20.0–28.0)
DELIVERY SYSTEMS: POSITIVE
DRAWN BY: 41977
Delivery systems: POSITIVE
Drawn by: 41977
EXPIRATORY PAP: 6
Expiratory PAP: 6
FIO2: 100
FIO2: 70
Inspiratory PAP: 12
Inspiratory PAP: 16
O2 SAT: 99.4 %
O2 Saturation: 98.6 %
PATIENT TEMPERATURE: 98.6
PATIENT TEMPERATURE: 98.6
PCO2 ART: 20.6 mmHg — AB (ref 32.0–48.0)
PH ART: 7.493 — AB (ref 7.350–7.450)
PO2 ART: 209 mmHg — AB (ref 83.0–108.0)
RATE: 22 resp/min
pCO2 arterial: 23.3 mmHg — ABNORMAL LOW (ref 32.0–48.0)
pH, Arterial: 7.409 (ref 7.350–7.450)
pO2, Arterial: 122 mmHg — ABNORMAL HIGH (ref 83.0–108.0)

## 2017-05-07 LAB — BASIC METABOLIC PANEL
ANION GAP: 13 (ref 5–15)
BUN: 13 mg/dL (ref 6–20)
CALCIUM: 8.2 mg/dL — AB (ref 8.9–10.3)
CHLORIDE: 108 mmol/L (ref 101–111)
CO2: 16 mmol/L — AB (ref 22–32)
Creatinine, Ser: 1.14 mg/dL (ref 0.61–1.24)
GFR calc non Af Amer: >60 mL/min (ref >60)
Glucose, Bld: 133 mg/dL — ABNORMAL HIGH (ref 65–99)
POTASSIUM: 3.9 mmol/L (ref 3.5–5.1)
Sodium: 137 mmol/L (ref 135–145)

## 2017-05-07 LAB — MRSA PCR SCREENING: MRSA BY PCR: NEGATIVE

## 2017-05-07 LAB — TROPONIN I
TROPONIN I: 0.05 ng/mL — AB (ref <0.03)
TROPONIN I: 1.81 ng/mL — AB (ref <0.03)
Troponin I: 1.12 ng/mL (ref <0.03)

## 2017-05-07 LAB — CBC
HCT: 44.7 % (ref 39.0–52.0)
HEMOGLOBIN: 15 g/dL (ref 13.0–17.0)
MCH: 31.1 pg (ref 26.0–34.0)
MCHC: 33.6 g/dL (ref 30.0–36.0)
MCV: 92.5 fL (ref 78.0–100.0)
Platelets: 175 10*3/uL (ref 150–400)
RBC: 4.83 MIL/uL (ref 4.22–5.81)
RDW: 14 % (ref 11.5–15.5)
WBC: 5.3 10*3/uL (ref 4.0–10.5)

## 2017-05-07 LAB — GLUCOSE, CAPILLARY
GLUCOSE-CAPILLARY: 138 mg/dL — AB (ref 65–99)
GLUCOSE-CAPILLARY: 173 mg/dL — AB (ref 65–99)
GLUCOSE-CAPILLARY: 116 mg/dL — AB (ref 65–99)
Glucose-Capillary: 123 mg/dL — ABNORMAL HIGH (ref 65–99)
Glucose-Capillary: 118 mg/dL — ABNORMAL HIGH (ref 65–99)

## 2017-05-07 LAB — LACTIC ACID, PLASMA
Lactic Acid, Venous: 1.4 mmol/L (ref 0.5–1.9)
Lactic Acid, Venous: 1.5 mmol/L (ref 0.5–1.9)
Lactic Acid, Venous: 1.6 mmol/L (ref 0.5–1.9)
Lactic Acid, Venous: 5.7 mmol/L (ref 0.5–1.9)

## 2017-05-07 MED ORDER — MORPHINE SULFATE (PF) 4 MG/ML IV SOLN
2.0000 mg | INTRAVENOUS | Status: DC | PRN
  Administered 2017-05-07 – 2017-05-08 (×4): 2 mg via INTRAVENOUS
  Filled 2017-05-07 (×4): qty 1

## 2017-05-07 MED ORDER — ORAL CARE MOUTH RINSE
15.0000 mL | Freq: Two times a day (BID) | OROMUCOSAL | Status: DC
  Administered 2017-05-07 – 2017-05-11 (×5): 15 mL via OROMUCOSAL

## 2017-05-07 MED ORDER — INSULIN ASPART 100 UNIT/ML ~~LOC~~ SOLN
0.0000 [IU] | SUBCUTANEOUS | Status: DC
  Administered 2017-05-07: 2 [IU] via SUBCUTANEOUS
  Administered 2017-05-07: 3 [IU] via SUBCUTANEOUS

## 2017-05-07 MED ORDER — PHENAZOPYRIDINE HCL 100 MG PO TABS
100.0000 mg | ORAL_TABLET | Freq: Three times a day (TID) | ORAL | Status: DC
  Administered 2017-05-07 – 2017-05-10 (×11): 100 mg via ORAL
  Filled 2017-05-07 (×13): qty 1

## 2017-05-07 MED ORDER — ALBUTEROL SULFATE (2.5 MG/3ML) 0.083% IN NEBU: 2.5000 mg | INHALATION_SOLUTION | RESPIRATORY_TRACT | Status: DC | PRN

## 2017-05-07 MED ORDER — PIPERACILLIN-TAZOBACTAM 3.375 G IVPB
3.3750 g | Freq: Three times a day (TID) | INTRAVENOUS | Status: DC
Start: 2017-05-07 — End: 2017-05-09
  Administered 2017-05-07 – 2017-05-09 (×7): 3.375 g via INTRAVENOUS
  Filled 2017-05-07 (×9): qty 50

## 2017-05-07 MED ORDER — PHENAZOPYRIDINE HCL 100 MG PO TABS
ORAL_TABLET | ORAL | Status: AC
  Filled 2017-05-07: qty 1

## 2017-05-07 MED ORDER — VANCOMYCIN HCL IN DEXTROSE 750-5 MG/150ML-% IV SOLN
750.0000 mg | Freq: Two times a day (BID) | INTRAVENOUS | Status: DC
  Administered 2017-05-07: 750 mg via INTRAVENOUS
  Filled 2017-05-07 (×2): qty 150

## 2017-05-07 MED ORDER — ORAL CARE MOUTH RINSE: 15.0000 mL | Freq: Two times a day (BID) | OROMUCOSAL | Status: DC

## 2017-05-07 MED ORDER — FAMOTIDINE IN NACL 20-0.9 MG/50ML-% IV SOLN
20.0000 mg | INTRAVENOUS | Status: DC
  Administered 2017-05-08 – 2017-05-09 (×2): 20 mg via INTRAVENOUS
  Filled 2017-05-07 (×3): qty 50

## 2017-05-07 MED ORDER — FUROSEMIDE 10 MG/ML IJ SOLN: 40.0000 mg | Freq: Once | INTRAMUSCULAR | Status: DC

## 2017-05-07 MED ORDER — CHLORHEXIDINE GLUCONATE 0.12 % MT SOLN
15.0000 mL | Freq: Two times a day (BID) | OROMUCOSAL | Status: DC
  Administered 2017-05-07: 15 mL via OROMUCOSAL

## 2017-05-07 NOTE — Progress Notes (Addendum)
Pharmacy Antibiotic Note  Benjamin [name] is a 76 y.o. male admitted on 05/06/2017 with sepsis.  Pharmacy has been consulted for vancomycin and zosyn dosing. Tmax 102.4 and WBC normal at 5.3. LA is elevated at 5.7. Scr 1.16 for estimated CrCl ~ 55-60 mL/min.   Of note, patient previously on ceftriaxone for sepsis/UTI, which has been d/c'd. Patient also received vancomycin 1g IV x1 and zosyn 3.375g IV x1 in ED.   Plan: D/C ceftriaxone Vancomycin 750 mg IV q12hr  Zosyn 3.375g IV q8hr (4 hr infusion) Vancomycin trough at [facility] and as needed (goal 15-20 mcg/mL) Monitor renal function, clinical picture, and culture data  Height: 5\' 11"  (180.3 cm) Weight: 190 lb 9.6 oz (86.5 kg) IBW/kg (Calculated) : 75.3  Temp (24hrs), Avg:99.3 F (37.4 C), Min:98 F (36.7 C), Max:102.4 F (39.1 C)   Recent Labs Lab 05/06/17 1613 05/06/17 1622 05/06/17 1942 05/07/17 0001 05/07/17 0240 05/07/17 0243  WBC 15.0*  --   --   --   --  5.3  CREATININE 1.16  --   --   --   --   --   LATICACIDVEN  --  3.10* 1.65 1.6 5.7*  --     Estimated Creatinine Clearance: 58.6 mL/min (by C-G formula based on SCr of 1.16 mg/dL).    Allergies  Allergen Reactions  . Arsenic     Severe swelling if patient comes in contact   . Contrast Media [Iodinated Diagnostic Agents]     Antimicrobials this admission: 8/21 Vanc x1, 8/22 >>  8/21 Zosyn x1, 8/22 >> 8/22 CTX >> 8/22   Microbiology results: pending   Argie Ramming, PharmD Clinical Pharmacist 05/07/17 3:31 AM

## 2017-05-07 NOTE — Progress Notes (Signed)
[facility] Progress Note Patient Name: Benjamin [name] DOB: [birth year] MRN: [id number]   Date of Service  05/07/2017  HPI/Events of Note  Patient in acute respiratory distress.  eICU Interventions  Will order: 1. Portable CXR STAT. 2. BiPAP - IPAP 12 / EPAP 5. Keep sat >= 93%. 3. 12 Lead EKG STAT. 4. Cycle Troponin. 5. PCCM bedside team notified to evaluate the patient at the bedside.      Intervention Category Major Interventions: Respiratory failure - evaluation and management  [name] 05/07/2017, 2:51 AM

## 2017-05-07 NOTE — Significant Event (Signed)
Patient in acute respiratory distress.  Seen and evaluated at bedside with PCCM attending present, appreciate their help.  PCCM attending states that they will take over care of patient in ICU at this point given unexplained worsening lactic acidosis that we believe is driving the respiratory distress.

## 2017-05-07 NOTE — Progress Notes (Signed)
Foley catheter placed at 0150 for acute urinary retention by Lorenso Courier at [facility]. Three RNs at the bedside, Lonia Mad, Sonia, C and Jarae Nemmers M. Sterile technique fallowed, peri care performed, bed pad changed, pt educated and procedure explained to pt. Pt c/o some pain after Foley placed that resolved within 15 minutes. About 150 cc was drained from bladder upon insertion. Urine appearance is yellow and cloudy. Foley care performed post insertion.

## 2017-05-07 NOTE — Progress Notes (Signed)
PHARMACY - PHYSICIAN COMMUNICATION CRITICAL VALUE ALERT - BLOOD CULTURE IDENTIFICATION (BCID)  Results for orders placed or performed during the hospital encounter of 05/06/17  Blood Culture ID Panel (Reflexed) (Collected: 05/06/2017  4:13 PM)  Result Value Ref Range   Enterococcus species NOT DETECTED NOT DETECTED   Vancomycin resistance NOT DETECTED NOT DETECTED   Listeria monocytogenes NOT DETECTED NOT DETECTED   Staphylococcus species NOT DETECTED NOT DETECTED   Staphylococcus aureus NOT DETECTED NOT DETECTED   Methicillin resistance NOT DETECTED NOT DETECTED   Streptococcus species NOT DETECTED NOT DETECTED   Streptococcus agalactiae NOT DETECTED NOT DETECTED   Streptococcus pneumoniae NOT DETECTED NOT DETECTED   Streptococcus pyogenes NOT DETECTED NOT DETECTED   Acinetobacter baumannii NOT DETECTED NOT DETECTED   Enterobacteriaceae species DETECTED (A) NOT DETECTED   Enterobacter cloacae complex NOT DETECTED NOT DETECTED   Escherichia coli DETECTED (A) NOT DETECTED   Klebsiella oxytoca NOT DETECTED NOT DETECTED   Klebsiella pneumoniae NOT DETECTED NOT DETECTED   Proteus species NOT DETECTED NOT DETECTED   Serratia marcescens NOT DETECTED NOT DETECTED   Carbapenem resistance NOT DETECTED NOT DETECTED   Haemophilus influenzae NOT DETECTED NOT DETECTED   Neisseria meningitidis NOT DETECTED NOT DETECTED   Pseudomonas aeruginosa NOT DETECTED NOT DETECTED   Candida albicans NOT DETECTED NOT DETECTED   Candida glabrata NOT DETECTED NOT DETECTED   Candida krusei NOT DETECTED NOT DETECTED   Candida parapsilosis NOT DETECTED NOT DETECTED   Candida tropicalis NOT DETECTED NOT DETECTED    Name of physician (or Provider) Contacted: Dr. Nelda Marseille   Changes to prescribed antibiotics required: Discontinued vancomycin   Jalene Mullet, Pharm.D. PGY1 Pharmacy Resident 05/07/2017 1:12 PM Main Pharmacy: [phone]
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
## 2017-05-07 NOTE — Progress Notes (Signed)
Pt received from 59M per bed

## 2017-05-07 NOTE — Progress Notes (Signed)
Pt call at the desk at 0220 c/o difficulty breathing. RN went to pt room, pt noted to be in respiratory distress, BP elevated, HR in 150s, RR in 40s, SpO2 85% on 2L  Bend. Pt placed on non rebreathe. Morphine 2 mg IV given, pt c/o abdominal pain.  RR called, respiratory called and MD on call notified. Pt still in distress, SpO2 100% on non rebreather. BP stable, HR elevated. MD at the bedside, pt remains Alert and oriented x 4.  Pt to transfer to SDU, Lactic acid came back elevated, plan to transfer pt to ICU. Report called to 2M12, pt transferred by RR and SWAT RN.  Pt wife notified about pt transfer.
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
## 2017-05-07 NOTE — Progress Notes (Signed)
Pharmacy paged to send rocephin. Waiting for medication to be sent.

## 2017-05-07 NOTE — Significant Event (Addendum)
Rapid Response Event Note  Overview: Time Called: 0226 Arrival Time: 0230 Event Type: Respiratory  Initial Focused Assessment: Called by primary RN to assess patient for acute respiratory distress. Upon arrival, patient was in distress, RR in the 50s, rapid shallow breaths, wheezing in the upper lungs, minimal air movement in the lower lungs bilaterally, patient stated he was not able to breathe and was struggling to breathe. + retractions, + accessory muscle use.  Patient was immediately placed on NRB 15L, RT at bedside with BIPAP.  Patient placed on BIPAP, primary team MD and CCM at bedside.  Initially SBP in was 180s and MAPs > 90, but once the patient was breathing better on the BIPAP, SBP 100-110, MAP at 65.  Patient was clammy and cool, became more diaphoretic upon transfer to MICU. HR was in the 130s-140s, SpO2 > maintained upon 88%.   Interventions:  - STAT CXR, ABG, LABS (BMP, CBC, TROPONIN, LACTIC) - EKG ST HR 130-140s - ABG- lactic acidosis, lactic 5.7 - Transferred to MICU under care of PCCM - ABX ordered per MD request - Patient was receiving NS at 125cc/hr, decreased to 75cc/hr, upon arrival MICU, PCCM PA called and ordered LR at 125cc/hr. LR started.   Plan of Care (if not transferred): - Taken to 2M12  Event Summary: Name of Physician Notified: [facility] NP Alcario Drought per Primary RNs at    Name of Consulting Physician Notified: CCM MD Warren Lacy  at 0250  Outcome: Transferred (Comment)  Event End Time: [place], [place]

## 2017-05-08 ENCOUNTER — Inpatient Hospital Stay (HOSPITAL_COMMUNITY): Payer: Medicare Other

## 2017-05-08 DIAGNOSIS — M25552 Pain in left hip: Secondary | ICD-10-CM

## 2017-05-08 DIAGNOSIS — J431 Panlobular emphysema: Secondary | ICD-10-CM

## 2017-05-08 DIAGNOSIS — J9601 Acute respiratory failure with hypoxia: Secondary | ICD-10-CM

## 2017-05-08 DIAGNOSIS — R197 Diarrhea, unspecified: Secondary | ICD-10-CM

## 2017-05-08 DIAGNOSIS — R1031 Right lower quadrant pain: Secondary | ICD-10-CM

## 2017-05-08 DIAGNOSIS — K580 Irritable bowel syndrome with diarrhea: Secondary | ICD-10-CM

## 2017-05-08 DIAGNOSIS — M25551 Pain in right hip: Secondary | ICD-10-CM

## 2017-05-08 DIAGNOSIS — G43001 Migraine without aura, not intractable, with status migrainosus: Secondary | ICD-10-CM

## 2017-05-08 LAB — BASIC METABOLIC PANEL
Anion gap: 7 (ref 5–15)
BUN: 17 mg/dL (ref 6–20)
CALCIUM: 8.2 mg/dL — AB (ref 8.9–10.3)
CHLORIDE: 109 mmol/L (ref 101–111)
CO2: 23 mmol/L (ref 22–32)
CREATININE: 1.01 mg/dL (ref 0.61–1.24)
Glucose, Bld: 99 mg/dL (ref 65–99)
Potassium: 3.7 mmol/L (ref 3.5–5.1)
SODIUM: 139 mmol/L (ref 135–145)

## 2017-05-08 LAB — ECHOCARDIOGRAM COMPLETE
Ao-asc: 30 cm
E decel time: 187 msec
EERAT: 12.48
FS: 23 % — AB (ref 28–44)
HEIGHTINCHES: 71 in
IVS/LV PW RATIO, ED: 1.1
LA diam end sys: 28 mm
LA diam index: 1.36 cm/m2
LA vol A4C: 47.1 ml
LA vol index: 21.5 mL/m2
LA vol: 44.3 mL
LASIZE: 28 mm
LV TDI E'LATERAL: 6.53
LVEEAVG: 12.48
LVEEMED: 12.48
LVELAT: 6.53 cm/s
LVOT VTI: 20.4 cm
LVOT area: 3.14 cm2
LVOT diameter: 20 mm
LVOT peak vel: 103 cm/s
LVOTSV: 64 mL
MV Dec: 187
MV pk A vel: 110 m/s
MVPG: 3 mmHg
MVPKEVEL: 81.5 m/s
PW: 8.87 mm — AB (ref 0.6–1.1)
RV LATERAL S' VELOCITY: 13.2 cm/s
RV TAPSE: 27.2 mm
TDI e' medial: 6.74
WEIGHTICAEL: 3040.58 [oz_av]

## 2017-05-08 LAB — GLUCOSE, CAPILLARY
GLUCOSE-CAPILLARY: 114 mg/dL — AB (ref 65–99)
Glucose-Capillary: 99 mg/dL (ref 65–99)

## 2017-05-08 LAB — PROCALCITONIN: Procalcitonin: 35.47 ng/mL

## 2017-05-08 LAB — CBC
HCT: 37.7 % — ABNORMAL LOW (ref 39.0–52.0)
Hemoglobin: 12.8 g/dL — ABNORMAL LOW (ref 13.0–17.0)
MCH: 31.3 pg (ref 26.0–34.0)
MCHC: 34 g/dL (ref 30.0–36.0)
MCV: 92.2 fL (ref 78.0–100.0)
PLATELETS: 190 10*3/uL (ref 150–400)
RBC: 4.09 MIL/uL — AB (ref 4.22–5.81)
RDW: 14.8 % (ref 11.5–15.5)
WBC: 10 10*3/uL (ref 4.0–10.5)

## 2017-05-08 LAB — MAGNESIUM: MAGNESIUM: 2 mg/dL (ref 1.7–2.4)

## 2017-05-08 LAB — PHOSPHORUS: PHOSPHORUS: 2 mg/dL — AB (ref 2.5–4.6)

## 2017-05-08 MED ORDER — DEXTROSE-NACL 5-0.9 % IV SOLN
INTRAVENOUS | Status: DC
  Administered 2017-05-08 – 2017-05-10 (×2): via INTRAVENOUS

## 2017-05-08 MED ORDER — HYDROCODONE-ACETAMINOPHEN 5-325 MG PO TABS
1.0000 | ORAL_TABLET | Freq: Four times a day (QID) | ORAL | Status: DC | PRN
  Administered 2017-05-08: 1 via ORAL
  Filled 2017-05-08: qty 1

## 2017-05-08 MED ORDER — SUMATRIPTAN SUCCINATE 6 MG/0.5ML ~~LOC~~ SOLN
6.0000 mg | Freq: Once | SUBCUTANEOUS | Status: AC
  Administered 2017-05-08: 6 mg via SUBCUTANEOUS
  Filled 2017-05-08: qty 0.5

## 2017-05-08 MED ORDER — INSULIN ASPART 100 UNIT/ML ~~LOC~~ SOLN
0.0000 [IU] | SUBCUTANEOUS | Status: DC
  Administered 2017-05-10: 1 [IU] via SUBCUTANEOUS
  Administered 2017-05-10: 2 [IU] via SUBCUTANEOUS
  Administered 2017-05-10: 4 [IU] via SUBCUTANEOUS
  Administered 2017-05-10: 3 [IU] via SUBCUTANEOUS
  Administered 2017-05-11: 1 [IU] via SUBCUTANEOUS
  Administered 2017-05-11: 2 [IU] via SUBCUTANEOUS
  Administered 2017-05-11 (×2): 1 [IU] via SUBCUTANEOUS

## 2017-05-08 NOTE — Progress Notes (Signed)
  Echocardiogram 2D Echocardiogram has been performed.  Benjamin [name] 05/08/2017, 3:47 PM

## 2017-05-08 NOTE — Progress Notes (Signed)
PROGRESS NOTE    Benjamin [name]  JKD:[id number] DOB: 27-Jun-1941 DOA: 05/06/2017 PCP: Alycia Rossetti, MD   Brief Narrative:  76 y.o. WM PMHx  BPH; GERD; COPD; and chronic GI issues (N/V/D)   Presenting with acute febrile illness.  Thursday, he went to GI.  He was given 2 medications - Colestid and Dicyclomine.  After that, "shit hit the fan."  Starting about 0500 Sunday AM, his back, shoulder, and neck started to hurt badly.  He thought he slept wrong.  Vicodin didn't phase it.  He tried to stand up and fell down 3 different times.  Then he started to hurt - joints, muscles.  He started with nonproductive cough.  +SOB.  He tried using albuterol without improvement.  Called GI Monday AM and they said to stop the new medications - he did that Sunday night.  They went to PCP today and they called 911.  Tmax 102.4 here and he has been shivering.  He is very hungry but not interested in food.  He has had ongoing chronic nausea without improvement.  Chronic diarrhea (these medications were supposed to stop the diarrhea).  He has not had further diarrhea.  He has been "weak as a kitten".    Subjective: 8/23 A/O 4, negative CP, positive SOB, positive abdominal pain, positive watery diarrhea, negative N/V. Not on home O2. States multiple episodes of watery diarrhea. Has not been on antibiotics recently. Not taking medication (laxatives). Positive increasing bilateral hip pain     Assessment & Plan:   Principal Problem:   Sepsis secondary to UTI [facility]) Active Problems:   BPH (benign prostatic hyperplasia)   Borderline diabetes   COPD (chronic obstructive pulmonary disease) (HCC)   Chronic diarrhea   Elevated troponin   Coronary artery calcification seen on CAT scan   Migraine/headache -Imitrex 6 mg subcutaneous 1  Acute respiratory failure with hypoxia/COPD  - unclear etiology.    -Resolved   Chronic diastolic CHF -Echocardiogram shows mild diastolic dysfunction see results  below -Scheduled for cardiac catheterization vs stress test per cardiology. Decision on 8/24 -Strict in and out -Daily weight  Borderline BP's  -Continued low BP but improved -D5-0.9% saline 30ml/hr -MAP goal> 65  Lactic acidosis  - unclear etiology.  -Resolved    Abd pain -Multifactorial UTI, IBS    IBS/Acute on Chronic diarrhea -Colestid and Bentyl held prior to admission when patient began to have increasing symptoms  -Diarrhea watery: Obtain GI Panel PCR-C.Diff  Sepsis UTI.positive Escherichia coli/positive Escherichia coli Bacteremia    DM - diet controlled. -Sensitive SSI      Hip Pain Acute on Chronic  - DG Hip Increase Bilat Hip pain eval for Fx vs acute on chronic OA    DVT prophylaxis: SCD/Lovenox Code Status: Full Family Communication: Bedside discussion of plan of care Disposition Plan: TBD   Consultants:  Cardiology  Procedures/Significant Events:  8/23 Echocardiogram:Left ventricle: The cavity size was normal. Systolic function was   normal. The estimated ejection fraction was in the range of 50%   to 55%. Wall motion was normal; there were no regional wall   motion abnormalities. Doppler parameters are consistent with   abnormal left ventricular relaxation (grade 1 diastolic   dysfunction). - Pericardium, extracardiac: A small pericardial effusion was   identified circumferential to the heart. There was no evidence of   hemodynamic compromise.      I have personally reviewed and interpreted all radiology studies and my findings are as above.  VENTILATOR  SETTINGS:    Cultures 8/21 urine positive Escherichia coli 8/21 blood positive Escherichia coli 8/22 MRSA by PCR negative      Antimicrobials: Anti-infectives    Start     Stop   05/07/17 1000  piperacillin-tazobactam (ZOSYN) IVPB 3.375 g         05/07/17 0400  vancomycin (VANCOCIN) IVPB 750 mg/150 ml premix  Status:  Discontinued     05/07/17 1230   05/06/17 2345   cefTRIAXone (ROCEPHIN) 2 g in dextrose 5 % 50 mL IVPB  Status:  Discontinued     05/07/17 0328   05/06/17 1615  piperacillin-tazobactam (ZOSYN) IVPB 3.375 g     05/06/17 1710   05/06/17 1615  vancomycin (VANCOCIN) IVPB 1000 mg/200 mL premix     05/06/17 1819       Devices    LINES / TUBES:      Continuous Infusions: . famotidine (PEPCID) IV Stopped (05/08/17 0430)  . piperacillin-tazobactam (ZOSYN)  IV Stopped (05/08/17 1319)     Objective: Vitals:   05/08/17 1000 05/08/17 1200 05/08/17 1243 05/08/17 1400  BP: 111/76 107/73  116/80  Pulse: 77 (!) 102  100  Resp: (!) 23 (!) 21  (!) 25  Temp:   98.3 F (36.8 C)   TempSrc:   Oral   SpO2: 97% 97%  95%  Weight:      Height:        Intake/Output Summary (Last 24 hours) at 05/08/17 1541 Last data filed at 05/08/17 1500  Gross per 24 hour  Intake             1050 ml  Output             1325 ml  Net             -275 ml   Filed Weights   05/06/17 1549 05/06/17 2301 05/07/17 0408  Weight: 180 lb (81.6 kg) 190 lb 9.6 oz (86.5 kg) 190 lb 0.6 oz (86.2 kg)    Examination:  General: A/O 4, No acute respiratory distress Neck:  Negative scars, masses, torticollis, lymphadenopathy, JVD Lungs: Clear to auscultation bilaterally without wheezes or crackles Cardiovascular: Tachycardic, Regular rhythm without murmur gallop or rub normal S1 and S2 Abdomen: Positive suprapubic abdominal pain/RLQ abdominal pain to palpation, nondistended, positive soft, bowel sounds, no rebound, no ascites, no appreciable mass Extremities: No significant cyanosis, clubbing, or edema bilateral lower extremities Skin: Negative rashes, lesions, ulcers Psychiatric:  Negative depression, negative anxiety, negative fatigue, negative mania  Central nervous system:  Cranial nerves II through XII intact, tongue/uvula midline, all extremities muscle strength 5/5, sensation intact throughout,  negative dysarthria, negative expressive aphasia, negative  receptive aphasia.  .     Data Reviewed: Care during the described time interval was provided by me .  I have reviewed this patient's available data, including medical history, events of note, physical examination, and all test results as part of my evaluation.   CBC:  Recent Labs Lab 05/06/17 1613 05/07/17 0243 05/08/17 0350  WBC 15.0* 5.3 10.0  NEUTROABS 13.1*  --   --   HGB 15.5 15.0 12.8*  HCT 44.4 44.7 37.7*  MCV 90.6 92.5 92.2  PLT 193 175 093   Basic Metabolic Panel:  Recent Labs Lab 05/06/17 1444 05/06/17 1613 05/07/17 0243 05/08/17 0350  NA  --  138 137 139  K  --  4.0 3.9 3.7  CL  --  105 108 109  CO2  --  19* 16*  23  GLUCOSE 174* 115* 133* 99  BUN  --  16 13 17   CREATININE  --  1.16 1.14 1.01  CALCIUM  --  8.8* 8.2* 8.2*  MG  --   --   --  2.0  PHOS  --   --   --  2.0*   GFR: Estimated Creatinine Clearance: 67.3 mL/min (by C-G formula based on SCr of 1.01 mg/dL). Liver Function Tests:  Recent Labs Lab 05/06/17 1613  AST 29  ALT 15*  ALKPHOS 68  BILITOT 1.8*  PROT 6.9  ALBUMIN 3.5    Recent Labs Lab 05/06/17 1613  LIPASE 21   No results for input(s): AMMONIA in the last 168 hours. Coagulation Profile:  Recent Labs Lab 05/06/17 1613  INR 1.18   Cardiac Enzymes:  Recent Labs Lab 05/07/17 0243 05/07/17 0913 05/07/17 1501  TROPONINI 0.05* 1.81* 1.12*   BNP (last 3 results) No results for input(s): PROBNP in the last 8760 hours. HbA1C: No results for input(s): HGBA1C in the last 72 hours. CBG:  Recent Labs Lab 05/07/17 0401 05/07/17 0836 05/07/17 1126 05/07/17 1532 05/07/17 2120  GLUCAP 123* 138* 173* 118* 116*   Lipid Profile: No results for input(s): CHOL, HDL, LDLCALC, TRIG, CHOLHDL, LDLDIRECT in the last 72 hours. Thyroid Function Tests: No results for input(s): TSH, T4TOTAL, FREET4, T3FREE, THYROIDAB in the last 72 hours. Anemia Panel: No results for input(s): VITAMINB12, FOLATE, FERRITIN, TIBC, IRON,  RETICCTPCT in the last 72 hours. Urine analysis:    Component Value Date/Time   COLORURINE AMBER (A) 05/06/2017 1558   APPEARANCEUR CLOUDY (A) 05/06/2017 1558   LABSPEC 1.025 05/06/2017 1558   PHURINE 5.0 05/06/2017 1558   GLUCOSEU NEGATIVE 05/06/2017 1558   HGBUR MODERATE (A) 05/06/2017 1558   BILIRUBINUR NEGATIVE 05/06/2017 1558   KETONESUR NEGATIVE 05/06/2017 1558   PROTEINUR 100 (A) 05/06/2017 1558   NITRITE POSITIVE (A) 05/06/2017 1558   LEUKOCYTESUR LARGE (A) 05/06/2017 1558   Sepsis Labs: @LABRCNTIP (procalcitonin:4,lacticidven:4)  ) Recent Results (from the past 240 hour(s))  Urine culture     Status: Abnormal (Preliminary result)   Collection Time: 05/06/17  3:58 PM  Result Value Ref Range Status   Specimen Description URINE, RANDOM  Final   Special Requests NONE  Final   Culture (A)  Final    >=100,000 COLONIES/mL ESCHERICHIA COLI SUSCEPTIBILITIES TO FOLLOW    Report Status PENDING  Incomplete  Culture, blood (Routine x 2)     Status: Abnormal (Preliminary result)   Collection Time: 05/06/17  4:13 PM  Result Value Ref Range Status   Specimen Description BLOOD RIGHT ANTECUBITAL  Final   Special Requests   Final    BOTTLES DRAWN AEROBIC AND ANAEROBIC Blood Culture adequate volume   Culture  Setup Time   Final    GRAM NEGATIVE RODS ANAEROBIC BOTTLE ONLY CRITICAL RESULT CALLED TO, READ BACK BY AND VERIFIED WITH: M TURNER 05/07/17 @ 47 M VESTAL    Culture ESCHERICHIA COLI SUSCEPTIBILITIES TO FOLLOW  (A)  Final   Report Status PENDING  Incomplete  Blood Culture ID Panel (Reflexed)     Status: Abnormal   Collection Time: 05/06/17  4:13 PM  Result Value Ref Range Status   Enterococcus species NOT DETECTED NOT DETECTED Final   Vancomycin resistance NOT DETECTED NOT DETECTED Final   Listeria monocytogenes NOT DETECTED NOT DETECTED Final   Staphylococcus species NOT DETECTED NOT DETECTED Final   Staphylococcus aureus NOT DETECTED NOT DETECTED Final   Methicillin  resistance  NOT DETECTED NOT DETECTED Final   Streptococcus species NOT DETECTED NOT DETECTED Final   Streptococcus agalactiae NOT DETECTED NOT DETECTED Final   Streptococcus pneumoniae NOT DETECTED NOT DETECTED Final   Streptococcus pyogenes NOT DETECTED NOT DETECTED Final   Acinetobacter baumannii NOT DETECTED NOT DETECTED Final   Enterobacteriaceae species DETECTED (A) NOT DETECTED Final    Comment: CRITICAL RESULT CALLED TO, READ BACK BY AND VERIFIED WITH: M TURNER 05/07/17 @ 33 M VESTAL    Enterobacter cloacae complex NOT DETECTED NOT DETECTED Final   Escherichia coli DETECTED (A) NOT DETECTED Final    Comment: CRITICAL RESULT CALLED TO, READ BACK BY AND VERIFIED WITH: M TURNER 05/07/17 @ 33 M VESTAL    Klebsiella oxytoca NOT DETECTED NOT DETECTED Final   Klebsiella pneumoniae NOT DETECTED NOT DETECTED Final   Proteus species NOT DETECTED NOT DETECTED Final   Serratia marcescens NOT DETECTED NOT DETECTED Final   Carbapenem resistance NOT DETECTED NOT DETECTED Final   Haemophilus influenzae NOT DETECTED NOT DETECTED Final   Neisseria meningitidis NOT DETECTED NOT DETECTED Final   Pseudomonas aeruginosa NOT DETECTED NOT DETECTED Final   Candida albicans NOT DETECTED NOT DETECTED Final   Candida glabrata NOT DETECTED NOT DETECTED Final   Candida krusei NOT DETECTED NOT DETECTED Final   Candida parapsilosis NOT DETECTED NOT DETECTED Final   Candida tropicalis NOT DETECTED NOT DETECTED Final  Culture, blood (Routine x 2)     Status: None (Preliminary result)   Collection Time: 05/06/17  4:35 PM  Result Value Ref Range Status   Specimen Description BLOOD LEFT ANTECUBITAL  Final   Special Requests   Final    BOTTLES DRAWN AEROBIC AND ANAEROBIC Blood Culture adequate volume   Culture NO GROWTH 2 DAYS  Final   Report Status PENDING  Incomplete  MRSA PCR Screening     Status: None   Collection Time: 05/07/17  4:11 AM  Result Value Ref Range Status   MRSA by PCR NEGATIVE NEGATIVE  Final    Comment:        The GeneXpert MRSA Assay (FDA approved for NASAL specimens only), is one component of a comprehensive MRSA colonization surveillance program. It is not intended to diagnose MRSA infection nor to guide or monitor treatment for MRSA infections.          Radiology Studies: Ct Abdomen Pelvis Wo Contrast  Result Date: 05/06/2017 CLINICAL DATA:  Abdominal pain, unspecified. Nonproductive cough. Chills. Dysuria and incontinence. EXAM: CT CHEST, ABDOMEN AND PELVIS WITHOUT CONTRAST TECHNIQUE: Multidetector CT imaging of the chest, abdomen and pelvis was performed following the standard protocol without IV contrast. COMPARISON:  Chest radiographs earlier this day. Abdominal radiograph 05/01/2017. No prior CT. FINDINGS: CT CHEST FINDINGS Cardiovascular: Atherosclerosis and tortuosity of the thoracic aorta. No aneurysm. There are coronary artery calcifications. Heart is normal in size. Small focal pericardial fluid anteriorly. Mediastinum/Nodes: No mediastinal or evidence of hilar adenopathy allowing for lack of IV contrast. Visualized thyroid gland is normal. The esophagus is decompressed. Lungs/Pleura: Moderate apical predominant emphysema. Linear/bandlike opacity in the posterior right lower lobe has a few internal air bronchograms. There is dependent atelectasis in the right lower lobe. Linear atelectasis in the left lower lobe. No pulmonary edema. No pleural fluid. Musculoskeletal: There are no acute or suspicious osseous abnormalities. CT ABDOMEN PELVIS FINDINGS Hepatobiliary: Mild hepatic steatosis. No evidence of focal hepatic lesion allowing for lack contrast. Gallbladder minimally distended, no calcified gallstone or pericholecystic inflammation. No biliary dilatation. Pancreas: Parenchymal atrophy with  scattered calcifications about pancreatic head and uncinate process. No ductal dilatation or inflammation. Spleen: Normal in size without focal abnormality.  Adrenals/Urinary Tract: Low-density 18 mm left adrenal nodule consistent with adenoma. The right adrenal gland is normal. No hydronephrosis. Bilateral perinephric edema is nonspecific, left greater than right. No urolithiasis. Ureters are decompressed. Urinary bladder is physiologically distended. Mild bladder wall thickening posteriorly. Stomach/Bowel: Despite repeat exam, motion artifact partially limits evaluation. Colonic diverticulosis is most prominent in the sigmoid colon no definite diverticulitis. The appendix is surgically absent. No bowel obstruction or evidence of inflammation. Stomach physiologically distended. Tiny hiatal hernia. Vascular/Lymphatic: Dense aortic atherosclerosis. No aneurysm. No bulky adenopathy, lack contrast and motion artifact obscures detailed evaluation. Reproductive: Enlarged prostate gland spanning 6.7 cm transverse. Other: No ascites or free air. No intra-abdominal abscess. Tiny fat containing umbilical hernia. Musculoskeletal: There are no acute or suspicious osseous abnormalities. Mild degenerative change in the spine. IMPRESSION: CT CHEST IMPRESSION: 1. Streaky right lower lobe opacity with air bronchograms, likely atelectasis or scarring. Dependent atelectasis in the right greater than left lower lobe. 2. Moderate emphysema. 3.  Aortic atherosclerosis and coronary artery calcifications. CT ABDOMEN/PELVIS IMPRESSION: 1. Mild nonspecific perinephric edema and bladder wall thickening, can be seen in the setting of urinary tract infection. 2. Colonic diverticulosis without acute inflammation. 3. Enlarged prostate gland. 4. Hepatic steatosis.  Left adrenal adenoma. 5. Aortic atherosclerosis. Aortic Atherosclerosis (ICD10-I70.0) and Emphysema (ICD10-J43.9). Electronically Signed   By: Jeb Levering M.D.   On: 05/06/2017 21:52   Ct Chest Wo Contrast  Result Date: 05/06/2017 CLINICAL DATA:  Abdominal pain, unspecified. Nonproductive cough. Chills. Dysuria and incontinence.  EXAM: CT CHEST, ABDOMEN AND PELVIS WITHOUT CONTRAST TECHNIQUE: Multidetector CT imaging of the chest, abdomen and pelvis was performed following the standard protocol without IV contrast. COMPARISON:  Chest radiographs earlier this day. Abdominal radiograph 05/01/2017. No prior CT. FINDINGS: CT CHEST FINDINGS Cardiovascular: Atherosclerosis and tortuosity of the thoracic aorta. No aneurysm. There are coronary artery calcifications. Heart is normal in size. Small focal pericardial fluid anteriorly. Mediastinum/Nodes: No mediastinal or evidence of hilar adenopathy allowing for lack of IV contrast. Visualized thyroid gland is normal. The esophagus is decompressed. Lungs/Pleura: Moderate apical predominant emphysema. Linear/bandlike opacity in the posterior right lower lobe has a few internal air bronchograms. There is dependent atelectasis in the right lower lobe. Linear atelectasis in the left lower lobe. No pulmonary edema. No pleural fluid. Musculoskeletal: There are no acute or suspicious osseous abnormalities. CT ABDOMEN PELVIS FINDINGS Hepatobiliary: Mild hepatic steatosis. No evidence of focal hepatic lesion allowing for lack contrast. Gallbladder minimally distended, no calcified gallstone or pericholecystic inflammation. No biliary dilatation. Pancreas: Parenchymal atrophy with scattered calcifications about pancreatic head and uncinate process. No ductal dilatation or inflammation. Spleen: Normal in size without focal abnormality. Adrenals/Urinary Tract: Low-density 18 mm left adrenal nodule consistent with adenoma. The right adrenal gland is normal. No hydronephrosis. Bilateral perinephric edema is nonspecific, left greater than right. No urolithiasis. Ureters are decompressed. Urinary bladder is physiologically distended. Mild bladder wall thickening posteriorly. Stomach/Bowel: Despite repeat exam, motion artifact partially limits evaluation. Colonic diverticulosis is most prominent in the sigmoid colon no  definite diverticulitis. The appendix is surgically absent. No bowel obstruction or evidence of inflammation. Stomach physiologically distended. Tiny hiatal hernia. Vascular/Lymphatic: Dense aortic atherosclerosis. No aneurysm. No bulky adenopathy, lack contrast and motion artifact obscures detailed evaluation. Reproductive: Enlarged prostate gland spanning 6.7 cm transverse. Other: No ascites or free air. No intra-abdominal abscess. Tiny fat containing umbilical hernia. Musculoskeletal: There  are no acute or suspicious osseous abnormalities. Mild degenerative change in the spine. IMPRESSION: CT CHEST IMPRESSION: 1. Streaky right lower lobe opacity with air bronchograms, likely atelectasis or scarring. Dependent atelectasis in the right greater than left lower lobe. 2. Moderate emphysema. 3.  Aortic atherosclerosis and coronary artery calcifications. CT ABDOMEN/PELVIS IMPRESSION: 1. Mild nonspecific perinephric edema and bladder wall thickening, can be seen in the setting of urinary tract infection. 2. Colonic diverticulosis without acute inflammation. 3. Enlarged prostate gland. 4. Hepatic steatosis.  Left adrenal adenoma. 5. Aortic atherosclerosis. Aortic Atherosclerosis (ICD10-I70.0) and Emphysema (ICD10-J43.9). Electronically Signed   By: Jeb Levering M.D.   On: 05/06/2017 21:52   Dg Chest Port 1 View  Result Date: 05/07/2017 CLINICAL DATA:  Respiratory distress this evening. EXAM: PORTABLE CHEST 1 VIEW COMPARISON:  CT from 05/06/2017 FINDINGS: Low lung volumes with bibasilar atelectasis. Superimposed pneumonia is at the lung bases would be difficult to entirely exclude. Heart is top-normal in size with aortic atherosclerosis. No acute nor suspicious osseous abnormalities IMPRESSION: 1. Streaky and patchy airspace opacities at the lung bases more likely to represent atelectasis and/or scarring. Superimposed minimal pneumonia would be difficult to entirely exclude. 2. Aortic atherosclerosis.  Electronically Signed   By: Ashley Royalty M.D.   On: 05/07/2017 03:24   Dg Chest Port 1 View  Result Date: 05/06/2017 CLINICAL DATA:  Fever and shortness of breath EXAM: PORTABLE CHEST 1 VIEW COMPARISON:  01/13/2015 FINDINGS: 1623 hours. Asymmetric elevation right hemidiaphragm. The lungs are clear without focal pneumonia, edema, pneumothorax or pleural effusion. Cardiopericardial silhouette is at upper limits of normal for size. The visualized bony structures of the thorax are intact. Telemetry leads overlie the chest. IMPRESSION: No active disease. Electronically Signed   By: Misty Stanley M.D.   On: 05/06/2017 16:36        Scheduled Meds: . enoxaparin (LOVENOX) injection  40 mg Subcutaneous Q24H  . furosemide  40 mg Intravenous Once  . mouth rinse  15 mL Mouth Rinse BID  . phenazopyridine  100 mg Oral TID WC  . tamsulosin  0.4 mg Oral QPC supper   Continuous Infusions: . famotidine (PEPCID) IV Stopped (05/08/17 0430)  . piperacillin-tazobactam (ZOSYN)  IV Stopped (05/08/17 1319)     LOS: 2 days    Time spent: 40 minutes    WOODS, Geraldo Docker, MD [facility] Pager [phone]   If 7PM-7AM, please contact night-coverage [email or web address] Password [facility] 05/08/2017, 3:41 PM

## 2017-05-08 NOTE — Evaluation (Signed)
Physical Therapy Evaluation Patient Details Name: Benjamin [name] MRN: [id number] DOB: [birth year] Today's Date: 05/08/2017   History of Present Illness  Patient is a 76 y/o male who presents with fever, chills, myalgias and arthalgias and found to have sepsis secondary to UTI.  CXR- showed atelectasis. Chest CT- RLL opacity with moderate emphysema with aortic atherosclerosis and coronary artery calcifications. PMH includes vision problems, COPD.   Clinical Impression  Patient presents with generalized weakness, dyspnea on exertion, impulsivity, poor safety awareness, impaired balance and impaired mobility s/p above. Pt also with muscle spasms BLEs and back- which he reports as new. Pt with complete LOB upon impulsively standing requiring Max A from PT to prevent fall. Pt independent PTA and lives with wife. HR up to 127 bpm and RR up to 40 during session. Tolerated side stepping along side bed with Min A for support. Will try RW next session. Will follow acutely to maximize independence and mobility prior to return home.     Follow Up Recommendations Home health PT;Supervision for mobility/OOB;Supervision/Assistance - 24 hour    Equipment Recommendations  None recommended by PT    Recommendations for Other Services OT consult     Precautions / Restrictions Precautions Precautions: Fall Precaution Comments: watch HR Restrictions Weight Bearing Restrictions: No      Mobility  Bed Mobility Overal bed mobility: Needs Assistance Bed Mobility: Rolling;Sidelying to Sit;Sit to Supine Rolling: Min guard Sidelying to sit: Min guard;HOB elevated   Sit to supine: Min guard;HOB elevated   General bed mobility comments: Increased time and effort to get to EOB. Use of rail. Spasms in bil hips.  Transfers Overall transfer level: Needs assistance Equipment used: None Transfers: Sit to/from Stand Sit to Stand: Min guard;Max assist         General transfer comment: Pt abruptly stood from EOB  and had complete LOB forward resulting in PT guiding pt back onto bed. Impulsive. Requires stern repetition of instructions.  Ambulation/Gait Ambulation/Gait assistance: Min assist Ambulation Distance (Feet): 10 Feet (x2 bouts) Assistive device: 1 person hand held assist Gait Pattern/deviations: Step-to pattern Gait velocity: decreased   General Gait Details: Able to side step along side bed 4 times with Min A for balance. HR up to 127 bpm. RR up to 40. Sp02 remained >92% on 4L/min 02.   Stairs            Wheelchair Mobility    Modified Rankin (Stroke Patients Only)       Balance Overall balance assessment: Needs assistance;History of Falls Sitting-balance support: Feet supported;No upper extremity supported Sitting balance-Leahy Scale: Fair     Standing balance support: During functional activity;Single extremity supported Standing balance-Leahy Scale: Poor Standing balance comment: Requires UE support for balance.                              Pertinent Vitals/Pain Pain Assessment: Faces Faces Pain Scale: Hurts little more Pain Location: spasms in LEs, buttocks Pain Descriptors / Indicators: Spasm;Grimacing;Guarding Pain Intervention(s): Monitored during session;Repositioned;Limited activity within patient's tolerance;Premedicated before session    [place] expects to be discharged to:: Private residence Living Arrangements: Spouse/significant other Available Help at Discharge: Family Type of Home: House Home Access: Stairs to enter Entrance Stairs-Rails: Right Entrance Stairs-Number of Steps: 6 Home Layout: One level Home Equipment: Shower seat - built in;Walker - 2 wheels;Cane - single point      Prior Function Level of Independence: Independent  Comments: Drives. Cleans, does some cooking. 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     Hand Dominance   Dominant Hand: Right    Extremity/Trunk Assessment   Upper Extremity  Assessment Upper Extremity Assessment: Defer to OT evaluation    Lower Extremity Assessment Lower Extremity Assessment: Generalized weakness       Communication   Communication: No difficulties  Cognition Arousal/Alertness: Awake/alert Behavior During Therapy: Impulsive Overall Cognitive Status: Within Functional Limits for tasks assessed                                 General Comments: Poor safety awareness.      General Comments General comments (skin integrity, edema, etc.): Wife present during session.    Exercises     Assessment/Plan    PT Assessment Patient needs continued PT services  PT Problem List Decreased strength;Decreased mobility;Decreased safety awareness;Pain;Decreased balance;Decreased knowledge of use of DME;Decreased activity tolerance;Cardiopulmonary status limiting activity       PT Treatment Interventions Therapeutic activities;Gait training;Therapeutic exercise;Patient/family education;Functional mobility training;Balance training;DME instruction    PT Goals (Current goals can be found in the Care Plan section)  Acute Rehab PT Goals Patient Stated Goal: to make this pain go away and returnt o PLOF PT Goal Formulation: With patient Time For Goal Achievement: 05/22/17 Potential to Achieve Goals: Fair    Frequency Min 3X/week   Barriers to discharge        Co-evaluation               AM-PAC PT "6 Clicks" Daily Activity  Outcome Measure Difficulty turning over in bed (including adjusting bedclothes, sheets and blankets)?: None Difficulty moving from lying on back to sitting on the side of the bed? : None Difficulty sitting down on and standing up from a chair with arms (e.g., wheelchair, bedside commode, etc,.)?: A Lot Help needed moving to and from a bed to chair (including a wheelchair)?: A Little Help needed walking in hospital room?: A Little Help needed climbing 3-5 steps with a railing? : A Lot 6 Click Score:  18    End of Session Equipment Utilized During Treatment: Oxygen;Gait belt Activity Tolerance: Patient limited by pain Patient left: in bed;with call bell/phone within reach;with bed alarm set;with family/visitor present Nurse Communication: Mobility status PT Visit Diagnosis: Unsteadiness on feet (R26.81);Pain Pain - Right/Left:  (both) Pain - part of body: Leg    Time: 1420-1459 PT Time Calculation (min) (ACUTE ONLY): 39 min   Charges:   PT Evaluation $PT Eval Moderate Complexity: 1 Mod PT Treatments $Therapeutic Activity: 23-37 mins   PT G Codes:        Wray Kearns, PT, DPT [phone]    Marguarite Arbour A Devontaye Ground 05/08/2017, 3:10 PM

## 2017-05-08 NOTE — Progress Notes (Signed)
Chaplain introduced herself to patient and patient immediately stated that he didn't need a Chaplain, but he needed a dietician.  Then I asked patient if he requested Advanced Directive and patient said yes.  So I said I take care of those and patient immediately apologized for saying he didn't need a Chaplain and we both chuckled.    Patient states he will read the paperwork to and when ready inform us.  He is not sure of all of the wording because he wants to make sure that the person he gives HCPOA to will follow through.  Chaplain went over that section with patient.    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  Patient and Chaplain talked and carried on a strong and humorous conversation of which the Chaplain enjoyed.  Patient shook Chaplain's hand and thanked me for visiting.  Chaplain would like to thank medical staff for taking care of this patient.    05/08/17 1014  Clinical Encounter Type  Visited With Patient  Visit Type Initial;Psychological support;Social support;Spiritual support  Recommendations (Will likely need Advanced Directive Completion)

## 2017-05-08 NOTE — Progress Notes (Signed)
Progress Note  Patient Name: Benjamin [name] Date of Encounter: 05/08/2017  Primary Cardiologist: New  Subjective   Very upset about his low sodium low fat diet.  Denies any CP and states "I don't have a heart problem"  Inpatient Medications    Scheduled Meds: . enoxaparin (LOVENOX) injection  40 mg Subcutaneous Q24H  . furosemide  40 mg Intravenous Once  . mouth rinse  15 mL Mouth Rinse BID  . phenazopyridine  100 mg Oral TID WC  . tamsulosin  0.4 mg Oral QPC supper   Continuous Infusions: . famotidine (PEPCID) IV Stopped (05/08/17 0430)  . piperacillin-tazobactam (ZOSYN)  IV 3.375 g (05/08/17 0919)   PRN Meds: acetaminophen **OR** acetaminophen, albuterol, morphine injection, ondansetron **OR** ondansetron (ZOFRAN) IV, promethazine   Vital Signs    Vitals:   05/08/17 0400 05/08/17 0600 05/08/17 0736 05/08/17 0738  BP: 98/64 92/70  96/66  Pulse: 79 76  70  Resp: (!) 21 20  19   Temp: 99 F (37.2 C)  98.2 F (36.8 C) 98.2 F (36.8 C)  TempSrc: Oral  Oral Oral  SpO2: 94% 94%  93%  Weight:      Height:        Intake/Output Summary (Last 24 hours) at 05/08/17 0943 Last data filed at 05/08/17 3151  Gross per 24 hour  Intake           841.25 ml  Output             1565 ml  Net          -723.75 ml   Filed Weights   05/06/17 1549 05/06/17 2301 05/07/17 0408  Weight: 180 lb (81.6 kg) 190 lb 9.6 oz (86.5 kg) 190 lb 0.6 oz (86.2 kg)    Telemetry    NSR - Personally Reviewed  ECG    No new EKG to review - Personally Reviewed  Physical Exam   GEN: No acute distress.   Neck: No JVD Cardiac: RRR, no murmurs, rubs, or gallops.  Respiratory: Clear to auscultation bilaterally. GI: Soft, nontender, non-distended  MS: No edema; No deformity. Neuro:  Nonfocal  Psych: Normal affect   Labs    Chemistry Recent Labs Lab 05/06/17 1613 05/07/17 0243 05/08/17 0350  NA 138 137 139  K 4.0 3.9 3.7  CL 105 108 109  CO2 19* 16* 23  GLUCOSE 115* 133* 99  BUN 16 13  17   CREATININE 1.16 1.14 1.01  CALCIUM 8.8* 8.2* 8.2*  PROT 6.9  --   --   ALBUMIN 3.5  --   --   AST 29  --   --   ALT 15*  --   --   ALKPHOS 68  --   --   BILITOT 1.8*  --   --   GFRNONAA 60* >60 >60  GFRAA >60 >60 >60  ANIONGAP 14 13 7      Hematology Recent Labs Lab 05/06/17 1613 05/07/17 0243 05/08/17 0350  WBC 15.0* 5.3 10.0  RBC 4.90 4.83 4.09*  HGB 15.5 15.0 12.8*  HCT 44.4 44.7 37.7*  MCV 90.6 92.5 92.2  MCH 31.6 31.1 31.3  MCHC 34.9 33.6 34.0  RDW 13.8 14.0 14.8  PLT 193 175 190    Cardiac Enzymes Recent Labs Lab 05/07/17 0243 05/07/17 0913 05/07/17 1501  TROPONINI 0.05* 1.81* 1.12*    Recent Labs Lab 05/06/17 1620  TROPIPOC 0.02     BNPNo results for input(s): BNP, PROBNP in the last 168 hours.  DDimer No results for input(s): DDIMER in the last 168 hours.   Radiology    Ct Abdomen Pelvis Wo Contrast  Result Date: 05/06/2017 CLINICAL DATA:  Abdominal pain, unspecified. Nonproductive cough. Chills. Dysuria and incontinence. EXAM: CT CHEST, ABDOMEN AND PELVIS WITHOUT CONTRAST TECHNIQUE: Multidetector CT imaging of the chest, abdomen and pelvis was performed following the standard protocol without IV contrast. COMPARISON:  Chest radiographs earlier this day. Abdominal radiograph 05/01/2017. No prior CT. FINDINGS: CT CHEST FINDINGS Cardiovascular: Atherosclerosis and tortuosity of the thoracic aorta. No aneurysm. There are coronary artery calcifications. Heart is normal in size. Small focal pericardial fluid anteriorly. Mediastinum/Nodes: No mediastinal or evidence of hilar adenopathy allowing for lack of IV contrast. Visualized thyroid gland is normal. The esophagus is decompressed. Lungs/Pleura: Moderate apical predominant emphysema. Linear/bandlike opacity in the posterior right lower lobe has a few internal air bronchograms. There is dependent atelectasis in the right lower lobe. Linear atelectasis in the left lower lobe. No pulmonary edema. No pleural  fluid. Musculoskeletal: There are no acute or suspicious osseous abnormalities. CT ABDOMEN PELVIS FINDINGS Hepatobiliary: Mild hepatic steatosis. No evidence of focal hepatic lesion allowing for lack contrast. Gallbladder minimally distended, no calcified gallstone or pericholecystic inflammation. No biliary dilatation. Pancreas: Parenchymal atrophy with scattered calcifications about pancreatic head and uncinate process. No ductal dilatation or inflammation. Spleen: Normal in size without focal abnormality. Adrenals/Urinary Tract: Low-density 18 mm left adrenal nodule consistent with adenoma. The right adrenal gland is normal. No hydronephrosis. Bilateral perinephric edema is nonspecific, left greater than right. No urolithiasis. Ureters are decompressed. Urinary bladder is physiologically distended. Mild bladder wall thickening posteriorly. Stomach/Bowel: Despite repeat exam, motion artifact partially limits evaluation. Colonic diverticulosis is most prominent in the sigmoid colon no definite diverticulitis. The appendix is surgically absent. No bowel obstruction or evidence of inflammation. Stomach physiologically distended. Tiny hiatal hernia. Vascular/Lymphatic: Dense aortic atherosclerosis. No aneurysm. No bulky adenopathy, lack contrast and motion artifact obscures detailed evaluation. Reproductive: Enlarged prostate gland spanning 6.7 cm transverse. Other: No ascites or free air. No intra-abdominal abscess. Tiny fat containing umbilical hernia. Musculoskeletal: There are no acute or suspicious osseous abnormalities. Mild degenerative change in the spine. IMPRESSION: CT CHEST IMPRESSION: 1. Streaky right lower lobe opacity with air bronchograms, likely atelectasis or scarring. Dependent atelectasis in the right greater than left lower lobe. 2. Moderate emphysema. 3.  Aortic atherosclerosis and coronary artery calcifications. CT ABDOMEN/PELVIS IMPRESSION: 1. Mild nonspecific perinephric edema and bladder wall  thickening, can be seen in the setting of urinary tract infection. 2. Colonic diverticulosis without acute inflammation. 3. Enlarged prostate gland. 4. Hepatic steatosis.  Left adrenal adenoma. 5. Aortic atherosclerosis. Aortic Atherosclerosis (ICD10-I70.0) and Emphysema (ICD10-J43.9). Electronically Signed   By: Jeb Levering M.D.   On: 05/06/2017 21:52   Ct Chest Wo Contrast  Result Date: 05/06/2017 CLINICAL DATA:  Abdominal pain, unspecified. Nonproductive cough. Chills. Dysuria and incontinence. EXAM: CT CHEST, ABDOMEN AND PELVIS WITHOUT CONTRAST TECHNIQUE: Multidetector CT imaging of the chest, abdomen and pelvis was performed following the standard protocol without IV contrast. COMPARISON:  Chest radiographs earlier this day. Abdominal radiograph 05/01/2017. No prior CT. FINDINGS: CT CHEST FINDINGS Cardiovascular: Atherosclerosis and tortuosity of the thoracic aorta. No aneurysm. There are coronary artery calcifications. Heart is normal in size. Small focal pericardial fluid anteriorly. Mediastinum/Nodes: No mediastinal or evidence of hilar adenopathy allowing for lack of IV contrast. Visualized thyroid gland is normal. The esophagus is decompressed. Lungs/Pleura: Moderate apical predominant emphysema. Linear/bandlike opacity in the posterior right lower lobe has  a few internal air bronchograms. There is dependent atelectasis in the right lower lobe. Linear atelectasis in the left lower lobe. No pulmonary edema. No pleural fluid. Musculoskeletal: There are no acute or suspicious osseous abnormalities. CT ABDOMEN PELVIS FINDINGS Hepatobiliary: Mild hepatic steatosis. No evidence of focal hepatic lesion allowing for lack contrast. Gallbladder minimally distended, no calcified gallstone or pericholecystic inflammation. No biliary dilatation. Pancreas: Parenchymal atrophy with scattered calcifications about pancreatic head and uncinate process. No ductal dilatation or inflammation. Spleen: Normal in size  without focal abnormality. Adrenals/Urinary Tract: Low-density 18 mm left adrenal nodule consistent with adenoma. The right adrenal gland is normal. No hydronephrosis. Bilateral perinephric edema is nonspecific, left greater than right. No urolithiasis. Ureters are decompressed. Urinary bladder is physiologically distended. Mild bladder wall thickening posteriorly. Stomach/Bowel: Despite repeat exam, motion artifact partially limits evaluation. Colonic diverticulosis is most prominent in the sigmoid colon no definite diverticulitis. The appendix is surgically absent. No bowel obstruction or evidence of inflammation. Stomach physiologically distended. Tiny hiatal hernia. Vascular/Lymphatic: Dense aortic atherosclerosis. No aneurysm. No bulky adenopathy, lack contrast and motion artifact obscures detailed evaluation. Reproductive: Enlarged prostate gland spanning 6.7 cm transverse. Other: No ascites or free air. No intra-abdominal abscess. Tiny fat containing umbilical hernia. Musculoskeletal: There are no acute or suspicious osseous abnormalities. Mild degenerative change in the spine. IMPRESSION: CT CHEST IMPRESSION: 1. Streaky right lower lobe opacity with air bronchograms, likely atelectasis or scarring. Dependent atelectasis in the right greater than left lower lobe. 2. Moderate emphysema. 3.  Aortic atherosclerosis and coronary artery calcifications. CT ABDOMEN/PELVIS IMPRESSION: 1. Mild nonspecific perinephric edema and bladder wall thickening, can be seen in the setting of urinary tract infection. 2. Colonic diverticulosis without acute inflammation. 3. Enlarged prostate gland. 4. Hepatic steatosis.  Left adrenal adenoma. 5. Aortic atherosclerosis. Aortic Atherosclerosis (ICD10-I70.0) and Emphysema (ICD10-J43.9). Electronically Signed   By: Jeb Levering M.D.   On: 05/06/2017 21:52   Dg Chest Port 1 View  Result Date: 05/07/2017 CLINICAL DATA:  Respiratory distress this evening. EXAM: PORTABLE CHEST 1  VIEW COMPARISON:  CT from 05/06/2017 FINDINGS: Low lung volumes with bibasilar atelectasis. Superimposed pneumonia is at the lung bases would be difficult to entirely exclude. Heart is top-normal in size with aortic atherosclerosis. No acute nor suspicious osseous abnormalities IMPRESSION: 1. Streaky and patchy airspace opacities at the lung bases more likely to represent atelectasis and/or scarring. Superimposed minimal pneumonia would be difficult to entirely exclude. 2. Aortic atherosclerosis. Electronically Signed   By: Ashley Royalty M.D.   On: 05/07/2017 03:24   Dg Chest Port 1 View  Result Date: 05/06/2017 CLINICAL DATA:  Fever and shortness of breath EXAM: PORTABLE CHEST 1 VIEW COMPARISON:  01/13/2015 FINDINGS: 1623 hours. Asymmetric elevation right hemidiaphragm. The lungs are clear without focal pneumonia, edema, pneumothorax or pleural effusion. Cardiopericardial silhouette is at upper limits of normal for size. The visualized bony structures of the thorax are intact. Telemetry leads overlie the chest. IMPRESSION: No active disease. Electronically Signed   By: Misty Stanley M.D.   On: 05/06/2017 16:36    Cardiac Studies   none  Patient Profile     76 y.o. male with no prior history of CAD who was admitted with fever, chills, myalgias and arthalgias and found to hae UTI and started on antibx.  Had sudden onset of SOB earlier today and started on Bipap.  This was after getting fluid resuscitated with 3.7L of IVF for sepsis and hypotension.  Cxray showed atelectasis and Chest CT showed RLL  opacity with moderate emphysema with aortic atherosclerosis and coronary artery calcifications.  He denies any history of chest pain or pressure but his wife says that he is fairly sedentary.  Assessment & Plan    Elevated troponin -Pt admitted with fever and UTI. Developed sudden onset shortness of breath, tachycardia, diaphoresis, chills and fever. Troponin checked and was 0.05, increased to 1.81 six hours  later. -Pt with hypotension after dyspneic episode, SBP in the 80's. Heart rates in the 70's-80's -CXR without significant abnormality -CT of the chest shows coronary artery calcification, heart is normal size and aortic atherosclerosis -Pt has not history of cardiac problems. No chest pain and no exertional symptoms prior to this illness.  -Troponin elevation is likely related to tachycardic episode with heart rates in the 140's. Heart rate recovered with treatment of respiratory distress. Troponin trending downward (1.18>1.12). -Echocardiogram is ordered and pending at this time. If any abnormalities seen on echo pt will need further cardiac evaluation. If echo normal will plan for outpatient stress test once he recovers from this illness.   Respiratory distress -Sudden episode of respiratory distress early this am with fever/chills/coughing -CXR without significant abnormality -improved with BiPap, now off O2 and denies any SOB after dose of Lasix yesterday  Hypotension -Blood pressure was in normal range until respiratory difficulty. Hypotensive yesterday with SBP in the 80's with MAP maintaining over 65 - BP improved this am but still in the mid to upper 03'K systolic  Urosepsis -IV antibiotics per IM, awaiting cultures -Suspected sepsis from urinary source due to retention in the setting of anticholinergic medication.  - treatment per [facility]   Signed, Fransico Him, MD  05/08/2017, 9:43 AM

## 2017-05-08 NOTE — Care Management Note (Addendum)
Case Management Note  Patient Details  Name: Benjamin [name] MRN: [id number] Date of Birth: 1940-10-19  Subjective/Objective:     From home with wife, presents with sepsis, hypotension, sob, started on bipap.  pta indep, per pt eval rec hhpt.  NCM left the [facility] agency list with patient and wife, the will know tomorrow who they would like to work with.                Action/Plan: NCM will follow for dc needs.   Expected Discharge Date:                  Expected Discharge Plan:  [place]  In-House Referral:     Discharge planning Services  CM Consult  Post Acute Care Choice:    Choice offered to:     DME Arranged:    DME Agency:     HH Arranged:    [place] Agency:     Status of Service:  In process, will continue to follow  If discussed at Long Length of Stay Meetings, dates discussed:    Additional Comments:  Zenon Mayo, RN 05/08/2017, 4:14 PM

## 2017-05-09 ENCOUNTER — Other Ambulatory Visit (HOSPITAL_COMMUNITY): Payer: Self-pay

## 2017-05-09 DIAGNOSIS — I5032 Chronic diastolic (congestive) heart failure: Secondary | ICD-10-CM

## 2017-05-09 DIAGNOSIS — N400 Enlarged prostate without lower urinary tract symptoms: Secondary | ICD-10-CM

## 2017-05-09 DIAGNOSIS — R339 Retention of urine, unspecified: Secondary | ICD-10-CM

## 2017-05-09 DIAGNOSIS — J441 Chronic obstructive pulmonary disease with (acute) exacerbation: Secondary | ICD-10-CM

## 2017-05-09 DIAGNOSIS — Z515 Encounter for palliative care: Secondary | ICD-10-CM

## 2017-05-09 DIAGNOSIS — E118 Type 2 diabetes mellitus with unspecified complications: Secondary | ICD-10-CM

## 2017-05-09 DIAGNOSIS — R7881 Bacteremia: Secondary | ICD-10-CM

## 2017-05-09 DIAGNOSIS — J410 Simple chronic bronchitis: Secondary | ICD-10-CM

## 2017-05-09 DIAGNOSIS — K529 Noninfective gastroenteritis and colitis, unspecified: Secondary | ICD-10-CM

## 2017-05-09 DIAGNOSIS — K589 Irritable bowel syndrome without diarrhea: Secondary | ICD-10-CM

## 2017-05-09 LAB — GLUCOSE, CAPILLARY
GLUCOSE-CAPILLARY: 105 mg/dL — AB (ref 65–99)
GLUCOSE-CAPILLARY: 106 mg/dL — AB (ref 65–99)
GLUCOSE-CAPILLARY: 245 mg/dL — AB (ref 65–99)
Glucose-Capillary: 95 mg/dL (ref 65–99)
Glucose-Capillary: 101 mg/dL — ABNORMAL HIGH (ref 65–99)
Glucose-Capillary: 252 mg/dL — ABNORMAL HIGH (ref 65–99)

## 2017-05-09 LAB — CULTURE, BLOOD (ROUTINE X 2): Special Requests: ADEQUATE

## 2017-05-09 LAB — BASIC METABOLIC PANEL
Anion gap: 8 (ref 5–15)
BUN: 11 mg/dL (ref 6–20)
CALCIUM: 8 mg/dL — AB (ref 8.9–10.3)
CO2: 21 mmol/L — AB (ref 22–32)
Chloride: 109 mmol/L (ref 101–111)
Creatinine, Ser: 0.95 mg/dL (ref 0.61–1.24)
GFR calc Af Amer: >60 mL/min (ref >60)
Glucose, Bld: 109 mg/dL — ABNORMAL HIGH (ref 65–99)
POTASSIUM: 3.7 mmol/L (ref 3.5–5.1)
Sodium: 138 mmol/L (ref 135–145)

## 2017-05-09 LAB — GASTROINTESTINAL PANEL BY PCR, STOOL (REPLACES STOOL CULTURE)
ASTROVIRUS: NOT DETECTED
Adenovirus F40/41: NOT DETECTED
CAMPYLOBACTER SPECIES: NOT DETECTED
CYCLOSPORA CAYETANENSIS: NOT DETECTED
Cryptosporidium: NOT DETECTED
ENTAMOEBA HISTOLYTICA: NOT DETECTED
ENTEROTOXIGENIC E COLI (ETEC): NOT DETECTED
Enteroaggregative E coli (EAEC): NOT DETECTED
Enteropathogenic E coli (EPEC): DETECTED — AB
Giardia lamblia: NOT DETECTED
Norovirus GI/GII: NOT DETECTED
Plesimonas shigelloides: NOT DETECTED
Rotavirus A: NOT DETECTED
SALMONELLA SPECIES: NOT DETECTED
SAPOVIRUS (I, II, IV, AND V): NOT DETECTED
SHIGA LIKE TOXIN PRODUCING E COLI (STEC): NOT DETECTED
SHIGELLA/ENTEROINVASIVE E COLI (EIEC): NOT DETECTED
VIBRIO CHOLERAE: NOT DETECTED
VIBRIO SPECIES: NOT DETECTED
YERSINIA ENTEROCOLITICA: NOT DETECTED

## 2017-05-09 LAB — TROPONIN I: Troponin I: 0.17 ng/mL (ref <0.03)

## 2017-05-09 LAB — C DIFFICILE QUICK SCREEN W PCR REFLEX
C DIFFICILE (CDIFF) INTERP: NOT DETECTED
C DIFFICILE (CDIFF) TOXIN: NEGATIVE
C DIFFICLE (CDIFF) ANTIGEN: NEGATIVE

## 2017-05-09 LAB — PROCALCITONIN: Procalcitonin: 20.18 ng/mL

## 2017-05-09 LAB — URINE CULTURE

## 2017-05-09 LAB — MAGNESIUM: Magnesium: 1.6 mg/dL — ABNORMAL LOW (ref 1.7–2.4)

## 2017-05-09 MED ORDER — CEFIXIME 400 MG PO CAPS
400.0000 mg | ORAL_CAPSULE | Freq: Every day | ORAL | Status: DC
  Administered 2017-05-09 – 2017-05-11 (×3): 400 mg via ORAL
  Filled 2017-05-09 (×4): qty 1

## 2017-05-09 MED ORDER — IPRATROPIUM-ALBUTEROL 0.5-2.5 (3) MG/3ML IN SOLN
3.0000 mL | Freq: Four times a day (QID) | RESPIRATORY_TRACT | Status: DC
  Administered 2017-05-09 – 2017-05-11 (×8): 3 mL via RESPIRATORY_TRACT
  Filled 2017-05-09 (×7): qty 3

## 2017-05-09 MED ORDER — METHYLPREDNISOLONE SODIUM SUCC 125 MG IJ SOLR
60.0000 mg | INTRAMUSCULAR | Status: DC
  Administered 2017-05-09 – 2017-05-10 (×2): 60 mg via INTRAVENOUS
  Filled 2017-05-09 (×2): qty 2

## 2017-05-09 MED ORDER — HYDROCODONE-ACETAMINOPHEN 5-325 MG PO TABS
1.0000 | ORAL_TABLET | Freq: Four times a day (QID) | ORAL | Status: DC | PRN
  Administered 2017-05-09: 2 via ORAL
  Filled 2017-05-09: qty 2

## 2017-05-09 MED ORDER — FAMOTIDINE 20 MG PO TABS
20.0000 mg | ORAL_TABLET | Freq: Every day | ORAL | Status: DC
  Administered 2017-05-10 – 2017-05-11 (×2): 20 mg via ORAL
  Filled 2017-05-09 (×2): qty 1

## 2017-05-09 NOTE — Consult Note (Addendum)
Consultation Note Date: 05/09/2017   Patient Name: Benjamin [name]  DOB: Feb 14, 1941  MRN: [id number]  Age / Sex: 76 y.o., male  PCP: Alycia Rossetti, MD Referring Physician: Allie Bossier, MD  Reason for Consultation: Ethics and Psychosocial/spiritual support  HPI/Patient Profile: 76 y.o. male  with past medical history of BPH, COPD, coronary artery disease, admitted on 05/06/2017 with fever as well as nausea vomiting and diarrhea. Patient reports that for the past 6 months he has had diarrhea and abdominal pain. He had he reports a colonoscopy approximately 6 months ago that showed that he had diverticulosis and a "ulcer". He is only eating bites and sips and still having chronic diarrhea. His C. difficile test is come back negative. Other values he was found to have bacteremia, pro-calcitonin today was 20.18 (was 35.47), UTI.  Earlier patient had been intermittently refusing oxygen, become more irritable. He seems to be struggling with how quickly he is becoming ill..   Clinical Assessment and Goals of Care: Allow patient to event about his current feelings regarding hospitalization, irritability, which seems to stem mostly from him being so sick, so quickly. Earlier patient was refusing to work oxygen and verbalizing feelings of hopelessness, I just be better off dead". He denies suicidal ideation. Most of this seems to be borne out of frustration with being sick and in the hospital  Patient at this point is able to make his own healthcare decisions. His wife, [name], would be his healthcare proxy in the event he was unable to speak for himself     SUMMARY OF RECOMMENDATIONS   Full code. Explained this term including chest compressions, defibrillation, and if he was unable to protect his airway he will be on a ventilator. He verbalized understanding and wishes to remain a full code at this time Code  Status/Advance Care Planning:  Full code    Symptom Management:   Pain: Patient is on Vicodin at home and takes 1-2 tablets as needed. He reports his average use is 2 times potentially "every 2 weeks". He is complaining of shoulder leg and stomach pain. We'll increase his Vicodin to 1-2 tabs every 6 hours as needed  Anxiety: Patient is quite irritable which could be stemming from undiagnosed anxiety or depression. If he is having trouble sleeping would recommend Remeron 15 mg at bedtime. This dose would be more sedating than a higher dose, but would also help his stomach upset  Palliative Prophylaxis:   Aspiration, Bowel Regimen, Frequent Pain Assessment, Oral Care and Turn Reposition  Additional Recommendations (Limitations, Scope, Preferences):  Full Scope Treatment  Psycho-social/Spiritual:   Desire for further Chaplaincy support:no  Additional Recommendations: Referral to Community Resources   Prognosis:   Unable to determine  Discharge Planning: To Be Determined      Primary Diagnoses: Present on Admission: . Sepsis secondary to UTI ([place]) . Borderline diabetes . BPH (benign prostatic hyperplasia) . Chronic diarrhea . COPD (chronic obstructive pulmonary disease) ([place])   I have reviewed the medical record, interviewed the patient  and family, and examined the patient. The following aspects are pertinent.  Past Medical History:  Diagnosis Date  . Abnormal EKG    History per report of ST elevations x 20 years, no cardiac history-anomale with aorta-sts "it always looks like i am having a heart atttack on my EKG  . Arthritis   . COPD (chronic obstructive pulmonary disease) ([place])    patient denies  . Enlarged prostate   . GERD (gastroesophageal reflux disease)   . Headache(784.0)    last one 5 yrs ago  . Vision problems    Blind x 20 years, regained site 2000, ? optic nerve injury   Social History   Social History  . Marital status: Married    Spouse name:  N/A  . Number of children: N/A  . Years of education: N/A   Occupational History  . retired    Social History Main Topics  . Smoking status: Former Smoker    Packs/day: 0.50    Years: 50.00    Types: Cigarettes    Quit date: 02/15/2015  . Smokeless tobacco: Never Used     Comment: Quit x 1 year  . Alcohol use 0.0 oz/week     Comment: seldom, social   . Drug use: No  . Sexual activity: Yes   Other Topics Concern  . None   Social History Narrative  . None   Family History  Problem Relation Age of Onset  . Diabetes Mother   . COPD Mother   . Heart disease Mother   . Colon cancer Neg Hx    Scheduled Meds: . enoxaparin (LOVENOX) injection  40 mg Subcutaneous Q24H  . [START ON 05/10/2017] famotidine  20 mg Oral Daily  . furosemide  40 mg Intravenous Once  . insulin aspart  0-9 Units Subcutaneous Q4H  . mouth rinse  15 mL Mouth Rinse BID  . phenazopyridine  100 mg Oral TID WC  . tamsulosin  0.4 mg Oral QPC supper   Continuous Infusions: . dextrose 5 % and 0.9% NaCl 75 mL/hr at 05/08/17 2337  . piperacillin-tazobactam (ZOSYN)  IV 3.375 g (05/09/17 0921)   PRN Meds:.acetaminophen **OR** acetaminophen, albuterol, HYDROcodone-acetaminophen, ondansetron **OR** ondansetron (ZOFRAN) IV, promethazine Medications Prior to Admission:  Prior to Admission medications   Medication Sig Start Date End Date Taking? Authorizing Provider  acetaminophen (TYLENOL) 500 MG tablet Take 1,000 mg by mouth every 6 (six) hours as needed for moderate pain or headache.   Yes [provider]  colestipol (COLESTID) 1 g tablet Take 1 g by mouth 2 (two) times daily. 05/01/17  Yes [provider]  dicyclomine (BENTYL) 10 MG capsule Take 10 mg by mouth 3 (three) times daily. 05/01/17  Yes [provider]  diphenhydrAMINE (BENADRYL) 25 MG tablet Take 25-50 mg by mouth every 6 (six) hours as needed for allergies.    Yes [provider]  famotidine (PEPCID) 20 MG tablet Take  20 mg by mouth daily as needed for heartburn or indigestion.    Yes [provider]  HYDROcodone-acetaminophen (NORCO) 5-325 MG tablet Take 1 tablet by mouth every 6 (six) hours as needed for moderate pain. 02/12/17  Yes [place], Modena Nunnery, MD  promethazine (PHENERGAN) 12.5 MG tablet Take 1 tablet (12.5 mg total) by mouth every 8 (eight) hours as needed for nausea or vomiting. 02/12/17  Yes [place], Modena Nunnery, MD  RABEprazole (ACIPHEX) 20 MG tablet Take 1 tablet (20 mg total) by mouth daily. 02/03/17  Yes Roseanne Kaufman  W, NP  tamsulosin (FLOMAX) 0.4 MG CAPS capsule TAKE 1 CAPSULE(0.4 MG) BY MOUTH DAILY 03/10/17  Yes [place], Modena Nunnery, MD  traMADol (ULTRAM) 50 MG tablet Take 50-100 mg by mouth every 6 (six) hours as needed for moderate pain.   Yes [provider]   Allergies  Allergen Reactions  . Arsenic     Severe swelling if patient comes in contact   . Contrast Media [Iodinated Diagnostic Agents]    Review of Systems  Unable to perform ROS: Other    Physical Exam  Constitutional: He is oriented to person, place, and time. He appears well-developed and well-nourished.  Patient is alert and oriented 4; seen in stepdown unit Wearing a facemask  HENT:  Head: Normocephalic and atraumatic.  Cardiovascular:  Tachycardic  Pulmonary/Chest:  Increased work of breathing at rest; cough  Abdominal: There is tenderness.  Musculoskeletal: Normal range of motion.  Neurological: He is alert and oriented to person, place, and time.  Skin: Skin is warm and dry.  Psychiatric:  Patient is irritable  Nursing note and vitals reviewed.   Vital Signs: BP 115/74 (BP Location: Right Arm)   Pulse 92   Temp 97.7 F (36.5 C) (Oral)   Resp 17   Ht 5\' 11"  (1.803 m)   Wt 86.5 kg (190 lb 11.2 oz)   SpO2 (!) 82% Comment: patient refuses to put nasal canula on  BMI 26.60 kg/m  Pain Assessment: No/denies pain POSS *See Group Information*: 1-Acceptable,Awake and alert Pain Score: 0-No  pain   SpO2: SpO2: (!) 82 % (patient refuses to put nasal canula on) O2 Device:SpO2: (!) 82 % (patient refuses to put nasal canula on) O2 Flow Rate: .O2 Flow Rate (L/min): 4 L/min  IO: Intake/output summary:  Intake/Output Summary (Last 24 hours) at 05/09/17 1150 Last data filed at 05/09/17 6967  Gross per 24 hour  Intake           963.75 ml  Output             1400 ml  Net          -436.25 ml    LBM: Last BM Date: 05/08/17 Baseline Weight: Weight: 81.6 kg (180 lb) Most recent weight: Weight: 86.5 kg (190 lb 11.2 oz)     Palliative Assessment/Data:   Flowsheet Rows     Most Recent Value  Intake Tab  Referral Department  Hospitalist  Unit at Time of Referral  Intermediate Care Unit  Palliative Care Primary Diagnosis  Sepsis/Infectious Disease  Date Notified  05/09/17  Palliative Care Type  New Palliative care  Reason for referral  Clarify Goals of Care  Clinical Assessment  Palliative Performance Scale Score  50%  Pain Max last 24 hours  Not able to report  Pain Min Last 24 hours  Not able to report  Dyspnea Max Last 24 Hours  Not able to report  Dyspnea Min Last 24 hours  Not able to report  Nausea Max Last 24 Hours  Not able to report  Nausea Min Last 24 Hours  Not able to report  Anxiety Max Last 24 Hours  Not able to report  Anxiety Min Last 24 Hours  Not able to report  Other Max Last 24 Hours  Not able to report  Psychosocial & Spiritual Assessment  Palliative Care Outcomes  Patient/Family meeting held?  Yes  Who was at the meeting?  pt  Palliative Care Outcomes  Provided advance care planning  Time In: 1030 Time Out: 1140 Time Total: 70 min Greater than 50%  of this time was spent counseling and coordinating care related to the above assessment and plan. Staffed with Dr. Sherral Hammers  Signed by: Dory Horn, NP   Please contact Palliative Medicine Team phone at [phone] for questions and concerns.  For individual provider: See  Shea Evans

## 2017-05-09 NOTE — Progress Notes (Signed)
Occupational Therapy Evaluation Patient Details Name: Benjamin [name] MRN: [id number] DOB: Apr 18, 1941 Today's Date: 05/09/2017    History of Present Illness Patient is a 76 y/o male who presents with fever, chills, myalgias and arthalgias and found to have sepsis secondary to UTI.  CXR- showed atelectasis. Chest CT- RLL opacity with moderate emphysema with aortic atherosclerosis and coronary artery calcifications. PMH includes vision problems, COPD.    Clinical Impression   PTA, pt lived at home with wife and was independent with ADL and mobility. Pt presents with functional decline and currently requires min A for mobility and ADL @ RW level. Pt with poor awareness of deficits and is at increased risk of falls. Session completed on 8L with VSS stable with 2/4 DOE.     Follow Up Recommendations  Home health OT;Supervision/Assistance - 24 hour    Equipment Recommendations  3 in 1 bedside commode  rollator   Recommendations for Other Services       Precautions / Restrictions Precautions Precautions: Fall Precaution Comments: watch HR Restrictions Weight Bearing Restrictions: No      Mobility Bed Mobility Overal bed mobility: Needs Assistance Bed Mobility: Supine to Sit Rolling: Min guard Sidelying to sit: Min guard;HOB elevated Supine to sit: Min assist;HOB elevated Sit to supine: Min guard;HOB elevated   General bed mobility comments: Assist to elevate trunk to get to EOB; "hunny I am going to need some help sitting up."   Transfers Overall transfer level: Needs assistance Equipment used: Rolling walker (2 wheeled) Transfers: Sit to/from Stand Sit to Stand: Min assist         General transfer comment: Despite cues for hand placement, pt pulling up on RW. Stood from [employer], from chair x2.     Balance Overall balance assessment: Needs assistance;History of Falls Sitting-balance support: Feet supported;No upper extremity supported Sitting balance-Leahy Scale:  Fair Sitting balance - Comments: Total A to donn socks   Standing balance support: During functional activity Standing balance-Leahy Scale: Fair Standing balance comment: Able to wash up at sink with Min guard-Min A for balance. Stood ~ 12 mins cleaning back side.                            ADL either performed or assessed with clinical judgement   ADL Overall ADL's : Needs assistance/impaired     Grooming: Set up;Sitting   Upper Body Bathing: Standing;Min guard   Lower Body Bathing: Minimal assistance;Sit to/from stand   Upper Body Dressing : Set up;Supervision/safety;Sitting   Lower Body Dressing: Minimal assistance;Sit to/from stand   Toilet Transfer: Minimal assistance;RW;Ambulation   Toileting- Water quality scientist and Hygiene: Min guard;Sit to/from stand       Functional mobility during ADLs: Minimal assistance;Rolling walker;Cueing for safety       Vision         Perception     Praxis      Pertinent Vitals/Pain Pain Assessment: Faces Faces Pain Scale: Hurts whole lot Pain Location: penis and spasms in quads Pain Descriptors / Indicators: Spasm;Grimacing;Guarding;Moaning Pain Intervention(s): Limited activity within patient's tolerance     Hand Dominance Right   Extremity/Trunk Assessment Upper Extremity Assessment Upper Extremity Assessment: Generalized weakness   Lower Extremity Assessment Lower Extremity Assessment: Generalized weakness   Cervical / Trunk Assessment Cervical / Trunk Assessment: Normal   Communication Communication Communication: No difficulties   Cognition Arousal/Alertness: Awake/alert Behavior During Therapy: Impulsive Overall Cognitive Status: Impaired/Different from baseline Area of Impairment: Safety/judgement;Memory  Memory: Decreased short-term memory   Safety/Judgement: Decreased awareness of safety;Decreased awareness of deficits     General Comments: Poor safety  awareness. Able to acknowledge he might need a RW during session but then stating he does not need it at home.   General Comments  VSS.     Exercises     Shoulder Instructions      Home Living Family/patient expects to be discharged to:: Private residence Living Arrangements: Spouse/significant other Available Help at Discharge: Family Type of Home: House Home Access: Stairs to enter Technical brewer of Steps: 6 Entrance Stairs-Rails: Right Home Layout: One level     Bathroom Shower/Tub: Occupational psychologist: Handicapped height Bathroom Accessibility: Yes How Accessible: Accessible via walker [place] - built in;Walker - 2 wheels;Cane - single point          Prior Functioning/Environment Level of Independence: Independent        Comments: Drives. Cleans, does some cooking. 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        OT Problem List: Decreased strength;Decreased activity tolerance;Impaired balance (sitting and/or standing);Decreased safety awareness;Decreased knowledge of use of DME or AE;Cardiopulmonary status limiting activity;Pain      OT Treatment/Interventions: Self-care/ADL training;Therapeutic exercise;Energy conservation;DME and/or AE instruction;Therapeutic activities;Patient/family education;Balance training    OT Goals(Current goals can be found in the care plan section) Acute Rehab OT Goals Patient Stated Goal: to make this pain go away and returnt o PLOF OT Goal Formulation: With patient Time For Goal Achievement: 05/23/17 Potential to Achieve Goals: Good ADL Goals Pt Will Perform Lower Body Bathing: with min guard assist;with supervision;with adaptive equipment;sit to/from stand Pt Will Perform Lower Body Dressing: with set-up;with supervision;sit to/from stand;with adaptive equipment Pt Will Transfer to Toilet: with supervision;bedside commode (RW level) Pt Will Perform Toileting - Clothing Manipulation and hygiene: with  supervision;sitting/lateral leans;sit to/from stand Additional ADL Goal #1: Pt will independently verbalize 3 energy conservation strategies for ADL  OT Frequency: Min 2X/week   Barriers to D/C:            Co-evaluation PT/OT/SLP Co-Evaluation/Treatment: Yes Reason for Co-Treatment: For patient/therapist safety;To address functional/ADL transfers PT goals addressed during session: Mobility/safety with mobility OT goals addressed during session: ADL's and self-care      AM-PAC PT "6 Clicks" Daily Activity     Outcome Measure Help from another person eating meals?: None Help from another person taking care of personal grooming?: A Little Help from another person toileting, which includes using toliet, bedpan, or urinal?: A Little Help from another person bathing (including washing, rinsing, drying)?: A Little Help from another person to put on and taking off regular upper body clothing?: A Little Help from another person to put on and taking off regular lower body clothing?: A Little 6 Click Score: 19   End of Session Equipment Utilized During Treatment: Gait belt;Rolling walker;Oxygen (8L face mask) Nurse Communication: Mobility status  Activity Tolerance: Patient tolerated treatment well Patient left: in chair;with call bell/phone within reach;with chair alarm set  OT Visit Diagnosis: Unsteadiness on feet (R26.81);Muscle weakness (generalized) (M62.81);Pain Pain - part of body:  (penis/quads)                Time: 1220-1250 OT Time Calculation (min): 30 min Charges:  OT General Charges $OT Visit: 1 Procedure OT Evaluation $OT Eval Moderate Complexity: 1 Procedure G-Codes:     Benjamin [name], OT/L  [phone] 05/09/2017  Benjamin [name],Benjamin [name] 05/09/2017, 4:50 PM

## 2017-05-09 NOTE — Progress Notes (Signed)
2D echo showed low normal LVF with Ef 50-55% with no RWMAs and small pericardial effusion.  No further inpt evaluation for ischemia.  He denies any CP.  He will need outpt nuclear stress test due to coronary artery calcifications as outpt once he has recovered from his sepsis.  His trop elevation is likely demand ischemia in the setting of sepsis and hypotension.  Will sign off.  Call with any questions.  We will set up outpt followup.

## 2017-05-09 NOTE — Progress Notes (Signed)
PROGRESS NOTE    Benjamin [name]  WUJ:[id number] DOB: 05/03/1941 DOA: 05/06/2017 PCP: Alycia Rossetti, MD   Brief Narrative:  76 y.o. WM PMHx  BPH; GERD; COPD; and chronic GI issues (N/V/D)    Presenting with acute febrile illness.  Thursday, he went to GI.  He was given 2 medications - Colestid and Dicyclomine.  After that, "shit hit the fan."  Starting about 0500 Sunday AM, his back, shoulder, and neck started to hurt badly.  He thought he slept wrong.  Vicodin didn't phase it.  He tried to stand up and fell down 3 different times.  Then he started to hurt - joints, muscles.  He started with nonproductive cough.  +SOB.  He tried using albuterol without improvement.  Called GI Monday AM and they said to stop the new medications - he did that Sunday night.  They went to PCP today and they called 911.  Tmax 102.4 here and he has been shivering.  He is very hungry but not interested in food.  He has had ongoing chronic nausea without improvement.  Chronic diarrhea (these medications were supposed to stop the diarrhea).  He has not had further diarrhea.  He has been "weak as a kitten".    Subjective: 8/24 patient had been belligerent toward staff (RN, NT, phlebotomist,) required the floor director to speak with patient, as well as PALLIATIVE CARE (patient was refusing all therapeutic modalities). A/O 4, positive SOB, negative CP, negative N/V, negative abdominal pain,   Assessment & Plan:   Principal Problem:   Sepsis secondary to UTI [facility]) Active Problems:   BPH (benign prostatic hyperplasia)   Borderline diabetes   COPD (chronic obstructive pulmonary disease) (HCC)   Chronic diarrhea   Elevated troponin   Coronary artery calcification seen on CAT scan   Migraine/headache -Imitrex 6 mg subcutaneous 1   Acute respiratory failure with hypoxia/COPD exacerbation? -unclear etiology.   -DuoNeb  QID -Solu-Medrol 60 mg daily  -Flutter valve -Ambulatory SPO2 on 8/25 -8/25 PCXR   Chronic  diastolic CHF -Echocardiogram shows mild diastolic dysfunction see results below -Scheduled for cardiac catheterization vs stress test per cardiology. Decision on 8/24 -Strict in and out since admission +3.3 L -Daily weight Filed Weights   05/06/17 2301 05/07/17 0408 05/09/17 0106  Weight: 190 lb 9.6 oz (86.5 kg) 190 lb 0.6 oz (86.2 kg) 190 lb 11.2 oz (86.5 kg)  -Schedule follow-up with Dr. Fransico Him in 2 weeks, Chronic Diastolic CHF,   Borderline BP's  -Normalized -Patient now eating and drinking  -MAP goal> 65   Lactic acidosis  - unclear etiology.  -Resolved    Abd pain -Multifactorial UTI, IBS    IBS/Acute on Chronic diarrhea -Colestid and Bentyl held prior to admission when patient began to have increasing symptoms  -Diarrhea watery: Obtain GI Panel PCR-C.Diff   Sepsis UTI.positive Escherichia coli/positive Escherichia coli Bacteremia -Patient will require minimum 2 weeks of antibiotics. Change to Cefixime today   -Schedule Follow-up with Dr. Vic Blackbird, in 2-3 weeks UTI sepsis, Escherichia coli bacteremia, diabetes, acute respiratory failure with hypoxia  Urine retention -Patient with urinary retention most likely secondary to UTI, has been treated with several days of antibiotics, discontinue Foley. Voiding trial.   DM Type 2 controlled with complications - diet controlled. -Sensitive SSI    Hip Pain Acute on Chronic  - DG Hip: Negative for OA    DVT prophylaxis: SCD/Lovenox Code Status: Full Family Communication: None Disposition Plan: Next 24-48 hours   Consultants:  Cardiology PALLIATIVE CARE    Procedures/Significant Events:  8/23 Echocardiogram: LVEF=: 50% to 55%. -Grade 1 diastolic dysfunction. - Pericardium, extracardiac: A small pericardial effusion no hemodynamic compromise      I have personally reviewed and interpreted all radiology studies and my findings are as above.  VENTILATOR SETTINGS: None   Cultures 8/21 urine  positive Escherichia coli 8/21 blood positive Escherichia coli 8/22 MRSA by PCR negative 8/24 stool negative C. difficile 8/24 stool positive Escherichia coli   Antimicrobials: Anti-infectives    Start     Stop   05/09/17 1600  Cefixime (SUPRAX) capsule 400 mg         05/07/17 1000  piperacillin-tazobactam (ZOSYN) IVPB 3.375 g  Status:  Discontinued     05/09/17 1458   05/07/17 0400  vancomycin (VANCOCIN) IVPB 750 mg/150 ml premix  Status:  Discontinued     05/07/17 1230   05/06/17 2345  cefTRIAXone (ROCEPHIN) 2 g in dextrose 5 % 50 mL IVPB  Status:  Discontinued     05/07/17 0328   05/06/17 1615  piperacillin-tazobactam (ZOSYN) IVPB 3.375 g     05/06/17 1710   05/06/17 1615  vancomycin (VANCOCIN) IVPB 1000 mg/200 mL premix     05/06/17 1819       Devices None   LINES / TUBES:      Continuous Infusions: . dextrose 5 % and 0.9% NaCl 75 mL/hr at 05/08/17 2337  . famotidine (PEPCID) IV Stopped (05/09/17 0520)  . piperacillin-tazobactam (ZOSYN)  IV Stopped (05/09/17 0444)     Objective: Vitals:   05/09/17 0106 05/09/17 0200 05/09/17 0426 05/09/17 0845  BP:  91/66 (!) 95/52 115/74  Pulse: 89 92 74 92  Resp: (!) 29 (!) 24 18 17   Temp:   98.1 F (36.7 C) 97.7 F (36.5 C)  TempSrc:   Oral Oral  SpO2: 93% 95% 96% (!) 82%  Weight: 190 lb 11.2 oz (86.5 kg)     Height:        Intake/Output Summary (Last 24 hours) at 05/09/17 0910 Last data filed at 05/09/17 0520  Gross per 24 hour  Intake          1323.75 ml  Output             1400 ml  Net           -76.25 ml   Filed Weights   05/06/17 2301 05/07/17 0408 05/09/17 0106  Weight: 190 lb 9.6 oz (86.5 kg) 190 lb 0.6 oz (86.2 kg) 190 lb 11.2 oz (86.5 kg)    Examination:  General: A/O 4, No acute respiratory distress Neck:  Negative scars, masses, torticollis, lymphadenopathy, JVD Lungs: Clear to auscultation bilaterally without wheezes or crackles Cardiovascular: Tachycardic, Regular rhythm without murmur  gallop or rub normal S1 and S2 Abdomen: Negative abdominal pain to palpation, nondistended, positive soft, bowel sounds, no rebound, no ascites, no appreciable mass Extremities: No significant cyanosis, clubbing, or edema bilateral lower extremities Skin: Negative rashes, lesions, ulcers Psychiatric:  Negative depression, negative anxiety, negative fatigue, negative mania  Central nervous system:  Cranial nerves II through XII intact, tongue/uvula midline, all extremities muscle strength 5/5, sensation intact throughout,  negative dysarthria, negative expressive aphasia, negative receptive aphasia.  .     Data Reviewed: Care during the described time interval was provided by me .  I have reviewed this patient's available data, including medical history, events of note, physical examination, and all test results as part of my evaluation.  CBC:  Recent Labs Lab 05/06/17 1613 05/07/17 0243 05/08/17 0350  WBC 15.0* 5.3 10.0  NEUTROABS 13.1*  --   --   HGB 15.5 15.0 12.8*  HCT 44.4 44.7 37.7*  MCV 90.6 92.5 92.2  PLT 193 175 694   Basic Metabolic Panel:  Recent Labs Lab 05/06/17 1444 05/06/17 1613 05/07/17 0243 05/08/17 0350  NA  --  138 137 139  K  --  4.0 3.9 3.7  CL  --  105 108 109  CO2  --  19* 16* 23  GLUCOSE 174* 115* 133* 99  BUN  --  16 13 17   CREATININE  --  1.16 1.14 1.01  CALCIUM  --  8.8* 8.2* 8.2*  MG  --   --   --  2.0  PHOS  --   --   --  2.0*   GFR: Estimated Creatinine Clearance: 67.3 mL/min (by C-G formula based on SCr of 1.01 mg/dL). Liver Function Tests:  Recent Labs Lab 05/06/17 1613  AST 29  ALT 15*  ALKPHOS 68  BILITOT 1.8*  PROT 6.9  ALBUMIN 3.5    Recent Labs Lab 05/06/17 1613  LIPASE 21   No results for input(s): AMMONIA in the last 168 hours. Coagulation Profile:  Recent Labs Lab 05/06/17 1613  INR 1.18   Cardiac Enzymes:  Recent Labs Lab 05/07/17 0243 05/07/17 0913 05/07/17 1501  TROPONINI 0.05* 1.81* 1.12*    BNP (last 3 results) No results for input(s): PROBNP in the last 8760 hours. HbA1C: No results for input(s): HGBA1C in the last 72 hours. CBG:  Recent Labs Lab 05/07/17 2120 05/08/17 2200 05/08/17 2320 05/09/17 0425 05/09/17 0850  GLUCAP 116* 114* 99 95 105*   Lipid Profile: No results for input(s): CHOL, HDL, LDLCALC, TRIG, CHOLHDL, LDLDIRECT in the last 72 hours. Thyroid Function Tests: No results for input(s): TSH, T4TOTAL, FREET4, T3FREE, THYROIDAB in the last 72 hours. Anemia Panel: No results for input(s): VITAMINB12, FOLATE, FERRITIN, TIBC, IRON, RETICCTPCT in the last 72 hours. Urine analysis:    Component Value Date/Time   COLORURINE AMBER (A) 05/06/2017 1558   APPEARANCEUR CLOUDY (A) 05/06/2017 1558   LABSPEC 1.025 05/06/2017 1558   PHURINE 5.0 05/06/2017 1558   GLUCOSEU NEGATIVE 05/06/2017 1558   HGBUR MODERATE (A) 05/06/2017 1558   BILIRUBINUR NEGATIVE 05/06/2017 1558   KETONESUR NEGATIVE 05/06/2017 1558   PROTEINUR 100 (A) 05/06/2017 1558   NITRITE POSITIVE (A) 05/06/2017 1558   LEUKOCYTESUR LARGE (A) 05/06/2017 1558   Sepsis Labs: @LABRCNTIP (procalcitonin:4,lacticidven:4)  ) Recent Results (from the past 240 hour(s))  Urine culture     Status: Abnormal (Preliminary result)   Collection Time: 05/06/17  3:58 PM  Result Value Ref Range Status   Specimen Description URINE, RANDOM  Final   Special Requests NONE  Final   Culture (A)  Final    >=100,000 COLONIES/mL ESCHERICHIA COLI SUSCEPTIBILITIES TO FOLLOW    Report Status PENDING  Incomplete  Culture, blood (Routine x 2)     Status: Abnormal (Preliminary result)   Collection Time: 05/06/17  4:13 PM  Result Value Ref Range Status   Specimen Description BLOOD RIGHT ANTECUBITAL  Final   Special Requests   Final    BOTTLES DRAWN AEROBIC AND ANAEROBIC Blood Culture adequate volume   Culture  Setup Time   Final    GRAM NEGATIVE RODS ANAEROBIC BOTTLE ONLY CRITICAL RESULT CALLED TO, READ BACK BY AND  VERIFIED WITH: M TURNER 05/07/17 @ 66 Addison Lank  Culture ESCHERICHIA COLI SUSCEPTIBILITIES TO FOLLOW  (A)  Final   Report Status PENDING  Incomplete  Blood Culture ID Panel (Reflexed)     Status: Abnormal   Collection Time: 05/06/17  4:13 PM  Result Value Ref Range Status   Enterococcus species NOT DETECTED NOT DETECTED Final   Vancomycin resistance NOT DETECTED NOT DETECTED Final   Listeria monocytogenes NOT DETECTED NOT DETECTED Final   Staphylococcus species NOT DETECTED NOT DETECTED Final   Staphylococcus aureus NOT DETECTED NOT DETECTED Final   Methicillin resistance NOT DETECTED NOT DETECTED Final   Streptococcus species NOT DETECTED NOT DETECTED Final   Streptococcus agalactiae NOT DETECTED NOT DETECTED Final   Streptococcus pneumoniae NOT DETECTED NOT DETECTED Final   Streptococcus pyogenes NOT DETECTED NOT DETECTED Final   Acinetobacter baumannii NOT DETECTED NOT DETECTED Final   Enterobacteriaceae species DETECTED (A) NOT DETECTED Final    Comment: CRITICAL RESULT CALLED TO, READ BACK BY AND VERIFIED WITH: M TURNER 05/07/17 @ 0919 M VESTAL    Enterobacter cloacae complex NOT DETECTED NOT DETECTED Final   Escherichia coli DETECTED (A) NOT DETECTED Final    Comment: CRITICAL RESULT CALLED TO, READ BACK BY AND VERIFIED WITH: M TURNER 05/07/17 @ 66 M VESTAL    Klebsiella oxytoca NOT DETECTED NOT DETECTED Final   Klebsiella pneumoniae NOT DETECTED NOT DETECTED Final   Proteus species NOT DETECTED NOT DETECTED Final   Serratia marcescens NOT DETECTED NOT DETECTED Final   Carbapenem resistance NOT DETECTED NOT DETECTED Final   Haemophilus influenzae NOT DETECTED NOT DETECTED Final   Neisseria meningitidis NOT DETECTED NOT DETECTED Final   Pseudomonas aeruginosa NOT DETECTED NOT DETECTED Final   Candida albicans NOT DETECTED NOT DETECTED Final   Candida glabrata NOT DETECTED NOT DETECTED Final   Candida krusei NOT DETECTED NOT DETECTED Final   Candida parapsilosis NOT  DETECTED NOT DETECTED Final   Candida tropicalis NOT DETECTED NOT DETECTED Final  Culture, blood (Routine x 2)     Status: None (Preliminary result)   Collection Time: 05/06/17  4:35 PM  Result Value Ref Range Status   Specimen Description BLOOD LEFT ANTECUBITAL  Final   Special Requests   Final    BOTTLES DRAWN AEROBIC AND ANAEROBIC Blood Culture adequate volume   Culture NO GROWTH 2 DAYS  Final   Report Status PENDING  Incomplete  MRSA PCR Screening     Status: None   Collection Time: 05/07/17  4:11 AM  Result Value Ref Range Status   MRSA by PCR NEGATIVE NEGATIVE Final    Comment:        The GeneXpert MRSA Assay (FDA approved for NASAL specimens only), is one component of a comprehensive MRSA colonization surveillance program. It is not intended to diagnose MRSA infection nor to guide or monitor treatment for MRSA infections.   C difficile quick scan w PCR reflex     Status: None   Collection Time: 05/09/17  5:12 AM  Result Value Ref Range Status   C Diff antigen NEGATIVE NEGATIVE Final   C Diff toxin NEGATIVE NEGATIVE Final   C Diff interpretation No C. difficile detected.  Final         Radiology Studies: Dg Hips Bilat With Pelvis 3-4 Views  Result Date: 05/08/2017 CLINICAL DATA:  Acute bilateral hip pain without known injury. EXAM: DG HIP (WITH OR WITHOUT PELVIS) 3-4V BILAT COMPARISON:  None. FINDINGS: There is no evidence of hip fracture or dislocation. There is no evidence of arthropathy or  other focal bone abnormality. IMPRESSION: Normal bilateral hips. Electronically Signed   By: Marijo Conception, M.D.   On: 05/08/2017 18:55        Scheduled Meds: . enoxaparin (LOVENOX) injection  40 mg Subcutaneous Q24H  . furosemide  40 mg Intravenous Once  . insulin aspart  0-9 Units Subcutaneous Q4H  . mouth rinse  15 mL Mouth Rinse BID  . phenazopyridine  100 mg Oral TID WC  . tamsulosin  0.4 mg Oral QPC supper   Continuous Infusions: . dextrose 5 % and 0.9% NaCl  75 mL/hr at 05/08/17 2337  . famotidine (PEPCID) IV Stopped (05/09/17 0520)  . piperacillin-tazobactam (ZOSYN)  IV Stopped (05/09/17 0444)     LOS: 3 days    Time spent: 40 minutes    Shanitra Phillippi, Geraldo Docker, MD [facility] Pager [phone]   If 7PM-7AM, please contact night-coverage [email or web address] Password [email or web address] 05/09/2017, 9:10 AM

## 2017-05-09 NOTE — Progress Notes (Signed)
PHARMACIST - PHYSICIAN COMMUNICATION  CONCERNING: IV to Oral Route Change Policy  RECOMMENDATION: This patient is receiving Famotidine by the intravenous route.  Based on criteria approved by the Pharmacy and Therapeutics Committee, the intravenous medication(s) is/are being converted to the equivalent oral dose form(s).   DESCRIPTION: These criteria include:  The patient is eating (either orally or via tube) and/or has been taking other orally administered medications for a least 24 hours  The patient has no evidence of active gastrointestinal bleeding or impaired GI absorption (gastrectomy, short bowel, patient on TNA or NPO).  If you have questions about this conversion, please contact the Pharmacy Department  []   [phone] )  [facility] []   [phone] )  [facility] [x]   [phone] )  [facility] []   [phone] )  [facility] []   [phone] )  [place], [facility] 05/09/2017 9:27 AM

## 2017-05-09 NOTE — Progress Notes (Signed)
Physical Therapy Treatment Patient Details Name: Benjamin [name] MRN: [id number] DOB: 1941-04-01 Today's Date: 05/09/2017    History of Present Illness Patient is a 76 y/o male who presents with fever, chills, myalgias and arthalgias and found to have sepsis secondary to UTI.  CXR- showed atelectasis. Chest CT- RLL opacity with moderate emphysema with aortic atherosclerosis and coronary artery calcifications. PMH includes vision problems, COPD.     PT Comments    Patient progressing slowly towards PT goals. More stable on feet today using RW for support. Continues to demonstrate poor safety awareness and impulsivity. Also limited by pain in penis and muscle spasms in bil quads. Pt with 2/4 DOE during activity and requires 8L 02 face mask to maintain Sp02 >90%. Fatigues but not admittedly. Will continue to follow and progress activity as tolerated.   Follow Up Recommendations  Home health PT;Supervision for mobility/OOB;Supervision/Assistance - 24 hour     Equipment Recommendations  None recommended by PT    Recommendations for Other Services       Precautions / Restrictions Precautions Precautions: Fall Restrictions Weight Bearing Restrictions: No    Mobility  Bed Mobility Overal bed mobility: Needs Assistance Bed Mobility: Supine to Sit     Supine to sit: Min assist;HOB elevated     General bed mobility comments: Assist to elevate trunk to get to EOB; "hunny I am going to need some help sitting up."   Transfers Overall transfer level: Needs assistance Equipment used: Rolling walker (2 wheeled) Transfers: Sit to/from Stand Sit to Stand: Min assist         General transfer comment: Despite cues for hand placement, pt pulling up on RW. Stood from [employer], from chair x2.   Ambulation/Gait Ambulation/Gait assistance: Min guard Ambulation Distance (Feet): 12 Feet Assistive device: Rolling walker (2 wheeled) Gait Pattern/deviations: Step-through pattern;Decreased stride  length;Trunk flexed Gait velocity: decreased   General Gait Details: Slow, mildly unsteady gait. Min guard for safety with lines and balance. Sp02 remained >90% on 8L 02 face mask.   Stairs            Wheelchair Mobility    Modified Rankin (Stroke Patients Only)       Balance Overall balance assessment: Needs assistance;History of Falls Sitting-balance support: Feet supported;No upper extremity supported Sitting balance-Leahy Scale: Fair Sitting balance - Comments: Total A to donn socks   Standing balance support: During functional activity   Standing balance comment: Able to wash up at sink with Min guard-Min A for balance. Stood ~ 12 mins cleaning back side.                             Cognition Arousal/Alertness: Awake/alert Behavior During Therapy: Impulsive Overall Cognitive Status: Impaired/Different from baseline Area of Impairment: Safety/judgement;Memory                     Memory: Decreased short-term memory   Safety/Judgement: Decreased awareness of safety;Decreased awareness of deficits     General Comments: Poor safety awareness. Able to acknowledge he might need a RW during session but then stating he does not need it at home.      Exercises      General Comments General comments (skin integrity, edema, etc.): VSS.       Pertinent Vitals/Pain Pain Assessment: Faces Faces Pain Scale: Hurts whole lot Pain Location: penis and spasms in quads Pain Descriptors / Indicators: Spasm;Grimacing;Guarding;Moaning Pain Intervention(s): Monitored during session  Home Living                      Prior Function            PT Goals (current goals can now be found in the care plan section) Progress towards PT goals: Progressing toward goals    Frequency    Min 3X/week      PT Plan Current plan remains appropriate    Co-evaluation PT/OT/SLP Co-Evaluation/Treatment: Yes Reason for Co-Treatment: For  patient/therapist safety;Necessary to address cognition/behavior during functional activity;To address functional/ADL transfers PT goals addressed during session: Mobility/safety with mobility        AM-PAC PT "6 Clicks" Daily Activity  Outcome Measure  Difficulty turning over in bed (including adjusting bedclothes, sheets and blankets)?: None Difficulty moving from lying on back to sitting on the side of the bed? : A Little Difficulty sitting down on and standing up from a chair with arms (e.g., wheelchair, bedside commode, etc,.)?: A Little Help needed moving to and from a bed to chair (including a wheelchair)?: A Little Help needed walking in hospital room?: A Little Help needed climbing 3-5 steps with a railing? : A Lot 6 Click Score: 18    End of Session Equipment Utilized During Treatment: Oxygen;Gait belt Activity Tolerance: Patient limited by pain Patient left: in chair;with call bell/phone within reach;with chair alarm set Nurse Communication: Mobility status PT Visit Diagnosis: Unsteadiness on feet (R26.81);Pain;Muscle weakness (generalized) (M62.81);Difficulty in walking, not elsewhere classified (R26.2) Pain - part of body:  (penis and BLEs)     Time: 3154-0086 PT Time Calculation (min) (ACUTE ONLY): 39 min  Charges:  $Therapeutic Activity: 8-22 mins                    G Codes:       Wray Kearns, PT, DPT [phone]     Marguarite Arbour A Cheyanna Strick 05/09/2017, 1:35 PM

## 2017-05-09 NOTE — Progress Notes (Signed)
Pt desatting around 80% and refuses to wear O2.  Talked to patient who is still oriented x 4 and explained the importance of oxygenating the blood.  Pt verbalized understanding and continues to curse me out re: wearing O2.  Asked pt if I switched it to a mask, would he wear it.  Pt did not answer.  Attempted to switch to venti mask 55% and pt removed and again cursed at me wanting a different nurse.  Will page md to discuss plan of care.

## 2017-05-09 NOTE — Progress Notes (Signed)
Dr. Radford Pax recommends arranging OP cardiology follow-up upon discharge - since timing of discharge has not been determined, please contact our practice when patient is discharged so we can arrange. This can be done through one of the APPs on Amion or sending inbox to [employer]. Call with questions.  Prosperity Darrough PA-C

## 2017-05-10 ENCOUNTER — Inpatient Hospital Stay (HOSPITAL_COMMUNITY): Payer: Medicare Other

## 2017-05-10 LAB — CBC WITH DIFFERENTIAL/PLATELET
BASOS ABS: 0 10*3/uL (ref 0.0–0.1)
BASOS PCT: 0 %
EOS PCT: 0 %
Eosinophils Absolute: 0 10*3/uL (ref 0.0–0.7)
HCT: 38.9 % — ABNORMAL LOW (ref 39.0–52.0)
Hemoglobin: 13.2 g/dL (ref 13.0–17.0)
Lymphocytes Relative: 11 %
Lymphs Abs: 0.4 10*3/uL — ABNORMAL LOW (ref 0.7–4.0)
MCH: 30.6 pg (ref 26.0–34.0)
MCHC: 33.9 g/dL (ref 30.0–36.0)
MCV: 90 fL (ref 78.0–100.0)
MONO ABS: 0.1 10*3/uL (ref 0.1–1.0)
Monocytes Relative: 2 %
Neutro Abs: 3.1 10*3/uL (ref 1.7–7.7)
Neutrophils Relative %: 87 %
PLATELETS: 238 10*3/uL (ref 150–400)
RBC: 4.32 MIL/uL (ref 4.22–5.81)
RDW: 14 % (ref 11.5–15.5)
WBC: 3.6 10*3/uL — ABNORMAL LOW (ref 4.0–10.5)

## 2017-05-10 LAB — BASIC METABOLIC PANEL
Anion gap: 8 (ref 5–15)
BUN: 11 mg/dL (ref 6–20)
CO2: 23 mmol/L (ref 22–32)
CREATININE: 0.92 mg/dL (ref 0.61–1.24)
Calcium: 8.7 mg/dL — ABNORMAL LOW (ref 8.9–10.3)
Chloride: 108 mmol/L (ref 101–111)
Glucose, Bld: 224 mg/dL — ABNORMAL HIGH (ref 65–99)
POTASSIUM: 3.5 mmol/L (ref 3.5–5.1)
SODIUM: 139 mmol/L (ref 135–145)

## 2017-05-10 LAB — GLUCOSE, CAPILLARY
GLUCOSE-CAPILLARY: 151 mg/dL — AB (ref 65–99)
GLUCOSE-CAPILLARY: 248 mg/dL — AB (ref 65–99)
Glucose-Capillary: 204 mg/dL — ABNORMAL HIGH (ref 65–99)
Glucose-Capillary: 135 mg/dL — ABNORMAL HIGH (ref 65–99)
Glucose-Capillary: 159 mg/dL — ABNORMAL HIGH (ref 65–99)

## 2017-05-10 LAB — MAGNESIUM: MAGNESIUM: 1.9 mg/dL (ref 1.7–2.4)

## 2017-05-10 MED ORDER — LOPERAMIDE HCL 2 MG PO CAPS: 2.0000 mg | ORAL_CAPSULE | ORAL | Status: DC | PRN

## 2017-05-10 MED ORDER — LOPERAMIDE HCL 2 MG PO CAPS
4.0000 mg | ORAL_CAPSULE | Freq: Once | ORAL | Status: AC
  Administered 2017-05-10: 4 mg via ORAL
  Filled 2017-05-10: qty 2

## 2017-05-10 NOTE — Progress Notes (Signed)
Patient ID: Benjamin [name], male   DOB: 19-Jun-1941, 76 y.o.   MRN: [id number] Pt ambulated on unit with RN assistance on the unit X3 laps. Pt's O2 saturation was monitored with a portable pulse oximeter. His saturation ranged from 91-94% on room air. Heart rate was 106.

## 2017-05-10 NOTE — Progress Notes (Signed)
PROGRESS NOTE    Benjamin [name]  DDU:[id number] DOB: [birth year] DOA: 05/06/2017 PCP: Alycia Rossetti, MD   Brief Narrative:  76 y.o. WM PMHx  BPH; GERD; COPD; and chronic GI issues (N/V/D)    Presenting with acute febrile illness.  Thursday, he went to GI.  He was given 2 medications - Colestid and Dicyclomine.  After that, "shit hit the fan."  Starting about 0500 Sunday AM, his back, shoulder, and neck started to hurt badly.  He thought he slept wrong.  Vicodin didn't phase it.  He tried to stand up and fell down 3 different times.  Then he started to hurt - joints, muscles.  He started with nonproductive cough.  +SOB.  He tried using albuterol without improvement.  Called GI Monday AM and they said to stop the new medications - he did that Sunday night.  They went to PCP today and they called 911.  Tmax 102.4 here and he has been shivering.  He is very hungry but not interested in food.  He has had ongoing chronic nausea without improvement.  Chronic diarrhea (these medications were supposed to stop the diarrhea).  He has not had further diarrhea.  He has been "weak as a kitten".    Subjective: 8/25 A/O 4, negative CP, positive SOB,, negative abdominal pain, negative N/V,    Assessment & Plan:   Principal Problem:   Sepsis secondary to UTI [facility]) Active Problems:   BPH (benign prostatic hyperplasia)   Borderline diabetes   COPD (chronic obstructive pulmonary disease) (HCC)   Chronic diarrhea   Elevated troponin   Coronary artery calcification seen on CAT scan   Palliative care by specialist   Acute respiratory failure with hypoxia/COPD exacerbation? -Unclear etiology, most likely COPD exacerbation.   -DuoNeb  QID -8/25 decrease Solu-Medrol 30 mg daily  -Flutter valve -Ambulatory SPO2 on 8/25 -8/25 PCXR, stable left basilar atelectasis   Chronic diastolic CHF -Echocardiogram shows mild diastolic dysfunction see results below -Strict in and out since admission + 2.9 L -Daily  weight Filed Weights   05/07/17 0408 05/09/17 0106 05/10/17 0443  Weight: 190 lb 0.6 oz (86.2 kg) 190 lb 11.2 oz (86.5 kg) 188 lb 11.4 oz (85.6 kg)   -Schedule follow-up with Dr. Fransico Him in 2 weeks, Chronic Diastolic CHF,And follow-up will have nuclear stress test   Borderline BP's  -Low-normal, tolerating well -MAP goal> 65   Abd pain -Multifactorial UTI, IBS  -   IBS/Acute on Chronic diarrhea -Colestid and Bentyl held prior to admission when patient began to have increasing symptoms  --C. difficile negative -Positive Escherichia coli -Start loperamide max dose 16 mg/24   Sepsis UTI.positive Escherichia coli/positive Escherichia coli Bacteremia -Patient will require 2 weeks antibiotics. Continue Cefixime    -Schedule Follow-up with Dr. Vic Blackbird, in 2-3 weeks UTI sepsis, Escherichia coli bacteremia, diabetes, acute respiratory failure with hypoxia   Urine retention -Patient with urinary retention most likely secondary to UTI, has been treated with several days of antibiotics, discontinue Foley. Voiding trial.   DM Type 2 controlled with complications -2/37 Hemoglobin A1c= 5.8 - diet controlled. -Sensitive SSI    Hip Pain Acute on Chronic  - DG Hip: Negative for OA       DVT prophylaxis: SCD/Lovenox Code Status: Full Family Communication: None Disposition Plan: Next 24-48 hours    DVT prophylaxis: Lovenox Code Status: Full Family Communication: Wife present for discussion of plan care Disposition Plan: DC 8/26   Consultants:  Cardiology PALLIATIVE CARE  Procedures/Significant Events:  8/23 Echocardiogram: LVEF=: 50% to 55%. -Grade 1 diastolic dysfunction. - Pericardium, extracardiac: A small pericardial effusion no hemodynamic compromise     I have personally reviewed and interpreted all radiology studies and my findings are as above.  VENTILATOR SETTINGS: None   Cultures 8/21 urine positive Escherichia coli 8/21 blood positive  Escherichia coli 8/22 MRSA by PCR negative 8/24 stool negative C. difficile 8/24 stool positive Escherichia coli    Antimicrobials: Anti-infectives    Start     Stop   05/09/17 1600  Cefixime (SUPRAX) capsule 400 mg         05/07/17 1000  piperacillin-tazobactam (ZOSYN) IVPB 3.375 g  Status:  Discontinued     05/09/17 1458   05/07/17 0400  vancomycin (VANCOCIN) IVPB 750 mg/150 ml premix  Status:  Discontinued     05/07/17 1230   05/06/17 2345  cefTRIAXone (ROCEPHIN) 2 g in dextrose 5 % 50 mL IVPB  Status:  Discontinued     05/07/17 0328   05/06/17 1615  piperacillin-tazobactam (ZOSYN) IVPB 3.375 g     05/06/17 1710   05/06/17 1615  vancomycin (VANCOCIN) IVPB 1000 mg/200 mL premix     05/06/17 1819       Devices None   LINES / TUBES:  None    Continuous Infusions: . dextrose 5 % and 0.9% NaCl 10 mL/hr at 05/10/17 1146     Objective: Vitals:   05/10/17 0816 05/10/17 1205 05/10/17 1528 05/10/17 1755  BP:  105/70    Pulse:  84  (!) 106  Resp:  (!) 26    Temp:  97.8 F (36.6 C)    TempSrc:  Oral    SpO2: 93%  96% 94%  Weight:      Height:        Intake/Output Summary (Last 24 hours) at 05/10/17 1856 Last data filed at 05/10/17 1500  Gross per 24 hour  Intake              606 ml  Output              725 ml  Net             -119 ml   Filed Weights   05/07/17 0408 05/09/17 0106 05/10/17 0443  Weight: 190 lb 0.6 oz (86.2 kg) 190 lb 11.2 oz (86.5 kg) 188 lb 11.4 oz (85.6 kg)    Examination:  General: A/O 4, positive acute respiratory distress Neck:  Negative scars, masses, torticollis, lymphadenopathy, JVD Lungs: Clear to auscultation bilaterally without wheezes or crackles Cardiovascular: Regular rate and rhythm without murmur gallop or rub normal S1 and S2 Abdomen: negative abdominal pain, nondistended, positive soft, bowel sounds, no rebound, no ascites, no appreciable mass Extremities: No significant cyanosis, clubbing, or edema bilateral lower  extremities Skin: Negative rashes, lesions, ulcers Psychiatric:  Negative depression, negative anxiety, negative fatigue, negative mania  Central nervous system:  Cranial nerves II through XII intact, tongue/uvula midline, all extremities muscle strength 5/5, sensation intact throughout,  negative dysarthria, negative expressive aphasia, negative receptive aphasia.  .     Data Reviewed: Care during the described time interval was provided by me .  I have reviewed this patient's available data, including medical history, events of note, physical examination, and all test results as part of my evaluation.   CBC:  Recent Labs Lab 05/06/17 1613 05/07/17 0243 05/08/17 0350 05/10/17 0244  WBC 15.0* 5.3 10.0 3.6*  NEUTROABS 13.1*  --   --  3.1  HGB 15.5 15.0 12.8* 13.2  HCT 44.4 44.7 37.7* 38.9*  MCV 90.6 92.5 92.2 90.0  PLT 193 175 190 235   Basic Metabolic Panel:  Recent Labs Lab 05/06/17 1613 05/07/17 0243 05/08/17 0350 05/09/17 0913 05/10/17 0244  NA 138 137 139 138 139  K 4.0 3.9 3.7 3.7 3.5  CL 105 108 109 109 108  CO2 19* 16* 23 21* 23  GLUCOSE 115* 133* 99 109* 224*  BUN 16 13 17 11 11   CREATININE 1.16 1.14 1.01 0.95 0.92  CALCIUM 8.8* 8.2* 8.2* 8.0* 8.7*  MG  --   --  2.0 1.6* 1.9  PHOS  --   --  2.0*  --   --    GFR: Estimated Creatinine Clearance: 73.9 mL/min (by C-G formula based on SCr of 0.92 mg/dL). Liver Function Tests:  Recent Labs Lab 05/06/17 1613  AST 29  ALT 15*  ALKPHOS 68  BILITOT 1.8*  PROT 6.9  ALBUMIN 3.5    Recent Labs Lab 05/06/17 1613  LIPASE 21   No results for input(s): AMMONIA in the last 168 hours. Coagulation Profile:  Recent Labs Lab 05/06/17 1613  INR 1.18   Cardiac Enzymes:  Recent Labs Lab 05/07/17 0243 05/07/17 0913 05/07/17 1501 05/09/17 1135  TROPONINI 0.05* 1.81* 1.12* 0.17*   BNP (last 3 results) No results for input(s): PROBNP in the last 8760 hours. HbA1C: No results for input(s): HGBA1C in  the last 72 hours. CBG:  Recent Labs Lab 05/09/17 2322 05/10/17 0441 05/10/17 0815 05/10/17 1204 05/10/17 1726  GLUCAP 245* 204* 151* 248* 135*   Lipid Profile: No results for input(s): CHOL, HDL, LDLCALC, TRIG, CHOLHDL, LDLDIRECT in the last 72 hours. Thyroid Function Tests: No results for input(s): TSH, T4TOTAL, FREET4, T3FREE, THYROIDAB in the last 72 hours. Anemia Panel: No results for input(s): VITAMINB12, FOLATE, FERRITIN, TIBC, IRON, RETICCTPCT in the last 72 hours. Urine analysis:    Component Value Date/Time   COLORURINE AMBER (A) 05/06/2017 1558   APPEARANCEUR CLOUDY (A) 05/06/2017 1558   LABSPEC 1.025 05/06/2017 1558   PHURINE 5.0 05/06/2017 1558   GLUCOSEU NEGATIVE 05/06/2017 1558   HGBUR MODERATE (A) 05/06/2017 1558   BILIRUBINUR NEGATIVE 05/06/2017 1558   KETONESUR NEGATIVE 05/06/2017 1558   PROTEINUR 100 (A) 05/06/2017 1558   NITRITE POSITIVE (A) 05/06/2017 1558   LEUKOCYTESUR LARGE (A) 05/06/2017 1558   Sepsis Labs: @LABRCNTIP (procalcitonin:4,lacticidven:4)  ) Recent Results (from the past 240 hour(s))  Urine culture     Status: Abnormal   Collection Time: 05/06/17  3:58 PM  Result Value Ref Range Status   Specimen Description URINE, RANDOM  Final   Special Requests NONE  Final   Culture >=100,000 COLONIES/mL ESCHERICHIA COLI (A)  Final   Report Status 05/09/2017 FINAL  Final   Organism ID, Bacteria ESCHERICHIA COLI (A)  Final      Susceptibility   Escherichia coli - MIC*    AMPICILLIN >=32 RESISTANT Resistant     CEFAZOLIN <=4 SENSITIVE Sensitive     CEFTRIAXONE <=1 SENSITIVE Sensitive     CIPROFLOXACIN <=0.25 SENSITIVE Sensitive     GENTAMICIN <=1 SENSITIVE Sensitive     IMIPENEM <=0.25 SENSITIVE Sensitive     NITROFURANTOIN <=16 SENSITIVE Sensitive     TRIMETH/SULFA <=20 SENSITIVE Sensitive     AMPICILLIN/SULBACTAM >=32 RESISTANT Resistant     PIP/TAZO <=4 SENSITIVE Sensitive     Extended ESBL NEGATIVE Sensitive     * >=100,000 COLONIES/mL  ESCHERICHIA COLI  Culture,  blood (Routine x 2)     Status: Abnormal   Collection Time: 05/06/17  4:13 PM  Result Value Ref Range Status   Specimen Description BLOOD RIGHT ANTECUBITAL  Final   Special Requests   Final    BOTTLES DRAWN AEROBIC AND ANAEROBIC Blood Culture adequate volume   Culture  Setup Time   Final    GRAM NEGATIVE RODS ANAEROBIC BOTTLE ONLY CRITICAL RESULT CALLED TO, READ BACK BY AND VERIFIED WITH: M TURNER 05/07/17 @ 36 M VESTAL    Culture ESCHERICHIA COLI (A)  Final   Report Status 05/09/2017 FINAL  Final   Organism ID, Bacteria ESCHERICHIA COLI  Final      Susceptibility   Escherichia coli - MIC*    AMPICILLIN >=32 RESISTANT Resistant     CEFAZOLIN <=4 SENSITIVE Sensitive     CEFEPIME <=1 SENSITIVE Sensitive     CEFTAZIDIME <=1 SENSITIVE Sensitive     CEFTRIAXONE <=1 SENSITIVE Sensitive     CIPROFLOXACIN <=0.25 SENSITIVE Sensitive     GENTAMICIN <=1 SENSITIVE Sensitive     IMIPENEM <=0.25 SENSITIVE Sensitive     TRIMETH/SULFA <=20 SENSITIVE Sensitive     AMPICILLIN/SULBACTAM >=32 RESISTANT Resistant     PIP/TAZO <=4 SENSITIVE Sensitive     Extended ESBL NEGATIVE Sensitive     * ESCHERICHIA COLI  Blood Culture ID Panel (Reflexed)     Status: Abnormal   Collection Time: 05/06/17  4:13 PM  Result Value Ref Range Status   Enterococcus species NOT DETECTED NOT DETECTED Final   Vancomycin resistance NOT DETECTED NOT DETECTED Final   Listeria monocytogenes NOT DETECTED NOT DETECTED Final   Staphylococcus species NOT DETECTED NOT DETECTED Final   Staphylococcus aureus NOT DETECTED NOT DETECTED Final   Methicillin resistance NOT DETECTED NOT DETECTED Final   Streptococcus species NOT DETECTED NOT DETECTED Final   Streptococcus agalactiae NOT DETECTED NOT DETECTED Final   Streptococcus pneumoniae NOT DETECTED NOT DETECTED Final   Streptococcus pyogenes NOT DETECTED NOT DETECTED Final   Acinetobacter baumannii NOT DETECTED NOT DETECTED Final   Enterobacteriaceae  species DETECTED (A) NOT DETECTED Final    Comment: CRITICAL RESULT CALLED TO, READ BACK BY AND VERIFIED WITH: M TURNER 05/07/17 @ 0919 M VESTAL    Enterobacter cloacae complex NOT DETECTED NOT DETECTED Final   Escherichia coli DETECTED (A) NOT DETECTED Final    Comment: CRITICAL RESULT CALLED TO, READ BACK BY AND VERIFIED WITH: M TURNER 05/07/17 @ 0919 M VESTAL    Klebsiella oxytoca NOT DETECTED NOT DETECTED Final   Klebsiella pneumoniae NOT DETECTED NOT DETECTED Final   Proteus species NOT DETECTED NOT DETECTED Final   Serratia marcescens NOT DETECTED NOT DETECTED Final   Carbapenem resistance NOT DETECTED NOT DETECTED Final   Haemophilus influenzae NOT DETECTED NOT DETECTED Final   Neisseria meningitidis NOT DETECTED NOT DETECTED Final   Pseudomonas aeruginosa NOT DETECTED NOT DETECTED Final   Candida albicans NOT DETECTED NOT DETECTED Final   Candida glabrata NOT DETECTED NOT DETECTED Final   Candida krusei NOT DETECTED NOT DETECTED Final   Candida parapsilosis NOT DETECTED NOT DETECTED Final   Candida tropicalis NOT DETECTED NOT DETECTED Final  Culture, blood (Routine x 2)     Status: None (Preliminary result)   Collection Time: 05/06/17  4:35 PM  Result Value Ref Range Status   Specimen Description BLOOD LEFT ANTECUBITAL  Final   Special Requests   Final    BOTTLES DRAWN AEROBIC AND ANAEROBIC Blood Culture adequate volume  Culture NO GROWTH 4 DAYS  Final   Report Status PENDING  Incomplete  MRSA PCR Screening     Status: None   Collection Time: 05/07/17  4:11 AM  Result Value Ref Range Status   MRSA by PCR NEGATIVE NEGATIVE Final    Comment:        The GeneXpert MRSA Assay (FDA approved for NASAL specimens only), is one component of a comprehensive MRSA colonization surveillance program. It is not intended to diagnose MRSA infection nor to guide or monitor treatment for MRSA infections.   C difficile quick scan w PCR reflex     Status: None   Collection Time:  05/09/17  5:12 AM  Result Value Ref Range Status   C Diff antigen NEGATIVE NEGATIVE Final   C Diff toxin NEGATIVE NEGATIVE Final   C Diff interpretation No C. difficile detected.  Final  Gastrointestinal Panel by PCR , Stool     Status: Abnormal   Collection Time: 05/09/17  5:12 AM  Result Value Ref Range Status   Campylobacter species NOT DETECTED NOT DETECTED Final   Plesimonas shigelloides NOT DETECTED NOT DETECTED Final   Salmonella species NOT DETECTED NOT DETECTED Final   Yersinia enterocolitica NOT DETECTED NOT DETECTED Final   Vibrio species NOT DETECTED NOT DETECTED Final   Vibrio cholerae NOT DETECTED NOT DETECTED Final   Enteroaggregative E coli (EAEC) NOT DETECTED NOT DETECTED Final   Enteropathogenic E coli (EPEC) DETECTED (A) NOT DETECTED Final    Comment: RESULT CALLED TO, READ BACK BY AND VERIFIED WITH: EVELYN OFORI 05/09/17 @ 5277  [place]    Enterotoxigenic E coli (ETEC) NOT DETECTED NOT DETECTED Final   Shiga like toxin producing E coli (STEC) NOT DETECTED NOT DETECTED Final   Shigella/Enteroinvasive E coli (EIEC) NOT DETECTED NOT DETECTED Final   Cryptosporidium NOT DETECTED NOT DETECTED Final   Cyclospora cayetanensis NOT DETECTED NOT DETECTED Final   Entamoeba histolytica NOT DETECTED NOT DETECTED Final   Giardia lamblia NOT DETECTED NOT DETECTED Final   Adenovirus F40/41 NOT DETECTED NOT DETECTED Final   Astrovirus NOT DETECTED NOT DETECTED Final   Norovirus GI/GII NOT DETECTED NOT DETECTED Final   Rotavirus A NOT DETECTED NOT DETECTED Final   Sapovirus (I, II, IV, and V) NOT DETECTED NOT DETECTED Final         Radiology Studies: Dg Chest Port 1 View  Result Date: 05/10/2017 CLINICAL DATA:  COPD exacerbation EXAM: PORTABLE CHEST 1 VIEW COMPARISON:  05/07/2017 FINDINGS: Cardiac shadow is stable. Aortic calcifications are again seen. The lungs are well aerated bilaterally. Mild atelectatic changes are noted in the left base stable from the previous exam. No new  focal abnormality is noted. No bony abnormality is seen. IMPRESSION: Stable left basilar atelectasis. Electronically Signed   By: Inez Catalina M.D.   On: 05/10/2017 14:27        Scheduled Meds: . Cefixime  400 mg Oral Daily  . enoxaparin (LOVENOX) injection  40 mg Subcutaneous Q24H  . famotidine  20 mg Oral Daily  . furosemide  40 mg Intravenous Once  . insulin aspart  0-9 Units Subcutaneous Q4H  . ipratropium-albuterol  3 mL Nebulization Q6H  . mouth rinse  15 mL Mouth Rinse BID  . methylPREDNISolone (SOLU-MEDROL) injection  60 mg Intravenous Q24H  . tamsulosin  0.4 mg Oral QPC supper   Continuous Infusions: . dextrose 5 % and 0.9% NaCl 10 mL/hr at 05/10/17 1146     LOS: 4 days  Time spent: 40 minutes    Curstin Schmale, Geraldo Docker, MD [facility] Pager [phone]   If 7PM-7AM, please contact night-coverage [email or web address] Password [facility] 05/10/2017, 6:56 PM

## 2017-05-10 NOTE — Plan of Care (Signed)
Problem: Respiratory: Goal: Ability to maintain adequate ventilation will improve Outcome: Progressing Pt has been able to maintain oxygen saturation >94% on room air during this shift.

## 2017-05-10 NOTE — Progress Notes (Signed)
Chaplain responded to request for information about services. Wife, Clara, wanted to know if Catholic Mass was offered at the hospital.  Since it is not, chaplain and wife/patient discussed their involvement in their own local church.  This is a delightful couple and they are very grateful for the patient's recovery, which comes on the heels of some serious health challenges.  Please contact as needed or requested.  Luana Shu [phone]    05/10/17 2100  Clinical Encounter Type  Visited With Patient and family together  Visit Type Initial;Spiritual support  Referral From Family  Consult/Referral To Chaplain  Spiritual Encounters  Spiritual Needs Ritual  Stress Factors  Patient Stress Factors Health changes  Family Stress Factors Other (Comment) (Looking for mass)

## 2017-05-10 NOTE — Plan of Care (Signed)
Problem: Respiratory: Goal: Ability to maintain adequate ventilation will improve Outcome: Progressing Pt has tolerated the Venturi Mask well, maintaining oxygen saturations >94% during this shift. Pt has needed some reinforcement on keeping the mask on his face.

## 2017-05-11 DIAGNOSIS — K580 Irritable bowel syndrome with diarrhea: Secondary | ICD-10-CM

## 2017-05-11 DIAGNOSIS — B9620 Unspecified Escherichia coli [E. coli] as the cause of diseases classified elsewhere: Secondary | ICD-10-CM

## 2017-05-11 DIAGNOSIS — E119 Type 2 diabetes mellitus without complications: Secondary | ICD-10-CM

## 2017-05-11 DIAGNOSIS — E7439 Other disorders of intestinal carbohydrate absorption: Secondary | ICD-10-CM

## 2017-05-11 DIAGNOSIS — J441 Chronic obstructive pulmonary disease with (acute) exacerbation: Secondary | ICD-10-CM

## 2017-05-11 DIAGNOSIS — R1031 Right lower quadrant pain: Secondary | ICD-10-CM

## 2017-05-11 DIAGNOSIS — R7881 Bacteremia: Secondary | ICD-10-CM

## 2017-05-11 DIAGNOSIS — I5032 Chronic diastolic (congestive) heart failure: Secondary | ICD-10-CM

## 2017-05-11 DIAGNOSIS — R339 Retention of urine, unspecified: Secondary | ICD-10-CM

## 2017-05-11 DIAGNOSIS — J9601 Acute respiratory failure with hypoxia: Secondary | ICD-10-CM

## 2017-05-11 LAB — CULTURE, BLOOD (ROUTINE X 2)
CULTURE: NO GROWTH
Special Requests: ADEQUATE

## 2017-05-11 LAB — BASIC METABOLIC PANEL
Anion gap: 8 (ref 5–15)
BUN: 17 mg/dL (ref 6–20)
CHLORIDE: 109 mmol/L (ref 101–111)
CO2: 23 mmol/L (ref 22–32)
CREATININE: 0.87 mg/dL (ref 0.61–1.24)
Calcium: 8.7 mg/dL — ABNORMAL LOW (ref 8.9–10.3)
GFR calc Af Amer: >60 mL/min (ref >60)
GFR calc non Af Amer: >60 mL/min (ref >60)
GLUCOSE: 156 mg/dL — AB (ref 65–99)
Potassium: 4 mmol/L (ref 3.5–5.1)
Sodium: 140 mmol/L (ref 135–145)

## 2017-05-11 LAB — GLUCOSE, CAPILLARY
Glucose-Capillary: 124 mg/dL — ABNORMAL HIGH (ref 65–99)
Glucose-Capillary: 125 mg/dL — ABNORMAL HIGH (ref 65–99)
Glucose-Capillary: 142 mg/dL — ABNORMAL HIGH (ref 65–99)
Glucose-Capillary: 182 mg/dL — ABNORMAL HIGH (ref 65–99)

## 2017-05-11 LAB — CBC
HEMATOCRIT: 36.1 % — AB (ref 39.0–52.0)
Hemoglobin: 12.3 g/dL — ABNORMAL LOW (ref 13.0–17.0)
MCH: 30.9 pg (ref 26.0–34.0)
MCHC: 34.1 g/dL (ref 30.0–36.0)
MCV: 90.7 fL (ref 78.0–100.0)
Platelets: 296 10*3/uL (ref 150–400)
RBC: 3.98 MIL/uL — ABNORMAL LOW (ref 4.22–5.81)
RDW: 14.1 % (ref 11.5–15.5)
WBC: 9.2 10*3/uL (ref 4.0–10.5)

## 2017-05-11 LAB — MAGNESIUM: Magnesium: 1.9 mg/dL (ref 1.7–2.4)

## 2017-05-11 MED ORDER — ZOLPIDEM TARTRATE 5 MG PO TABS
5.0000 mg | ORAL_TABLET | Freq: Every evening | ORAL | Status: DC | PRN
  Administered 2017-05-11: 5 mg via ORAL
  Filled 2017-05-11: qty 1

## 2017-05-11 MED ORDER — ALBUTEROL SULFATE HFA 108 (90 BASE) MCG/ACT IN AERS: 2.0000 | INHALATION_SPRAY | Freq: Four times a day (QID) | RESPIRATORY_TRACT | 0 refills | Status: DC | PRN

## 2017-05-11 MED ORDER — LOPERAMIDE HCL 2 MG PO CAPS: 2.0000 mg | ORAL_CAPSULE | ORAL | 0 refills | Status: DC | PRN

## 2017-05-11 MED ORDER — CEFIXIME 400 MG PO CAPS: 400.0000 mg | ORAL_CAPSULE | Freq: Every day | ORAL | 0 refills | Status: DC

## 2017-05-11 NOTE — Care Management Note (Signed)
Case Management Note  Patient Details  Name: Benjamin [name] MRN: [id number] Date of Birth: 1940-12-08  Subjective/Objective:  76 y.o. Male to be discharged with HHPT/OT and respiratory Therapy. Tells me he chooses [facility] and I have alerted Jermaine. Reggie will deliver 3n1 and RW prior to discharge. Pt had questions re: Glucometer and I suggested the [facility] at [facility] 2/2 affordability of the strips. He asked about a Pulse Oximeter which I suggested he talk to RT during a home visit. Assures me he is active with [facility] for O2 Therapy already.                   Action/Plan:CM will sign off for now but will be available should additional discharge needs arise or disposition change.    Expected Discharge Date:  05/11/17               Expected Discharge Plan:  [place]  In-House Referral:     Discharge planning Services  CM Consult  Post Acute Care Choice:  Durable Medical Equipment, Home Health Choice offered to:  Patient  DME Arranged:  3-N-1, [place] rolling DME Agency:  [place]:  PT, OT [facility] Agency:  [place]  Status of Service:  Completed, signed off  If discussed at [place] of Stay Meetings, dates discussed:    Additional Comments:  Delrae Sawyers, RN 05/11/2017, 2:15 PM

## 2017-05-11 NOTE — Discharge Summary (Addendum)
Physician Discharge Summary  Benjamin [name] BDZ:[id number] DOB: November 04, 1940 DOA: 05/06/2017  PCP: Alycia Rossetti, MD  Admit date: 05/06/2017 Discharge date: 05/11/2017  Time spent:35 minutes  Recommendations for Outpatient Follow-up:  Acute respiratory failure with hypoxia/COPD exacerbation -Unclear etiology, most likely COPD exacerbation.   -Resolved -Ambulatory SPO2 performed prior to discharge patient does not meet criteria for home O2 -Albuterol PRN -Schedule Follow-up with Benjamin [name], in 2-3 weeks UTI sepsis, Escherichia coli bacteremia, diabetes, acute respiratory failure with hypoxia -Schedule an establish care appointment in 4-6 weeks with Benjamin [name] PCCM, requires spirometry pre-/post bronchodilator, DLCO to officially diagnose COPD    Chronic diastolic CHF -Echocardiogram shows mild diastolic dysfunction see results below -Strict in and out since admission + 2.9 L -Daily weight Filed Weights   05/09/17 0106 05/10/17 0443 05/11/17 0438  Weight: 190 lb 11.2 oz (86.5 kg) 188 lb 11.4 oz (85.6 kg) 188 lb 4.4 oz (85.4 kg)  -Follow-up with Benjamin [name] On September 17 at 1245 , Chronic Diastolic CHF.,Nuclear Stress test to be performed.   Abd pain -Multifactorial UTI, IBS    IBS/Acute on Chronic diarrhea -Colestid and Bentyl held prior to admission when patient began to have increasing symptoms, Per his PCP   --Benjamin [name] negative -Positive Escherichia coli -Loperamide max dose 16 mg/24  -Patient to discuss with PCP restarting Colestipol, and Bentyl    Sepsis UTI.positive Escherichia coli/positive Escherichia coli Bacteremia -Patient will require 2 weeks antibiotics. Continue Cefixime    -Schedule Follow-up with Benjamin [name], in 2-3 weeks UTI sepsis, Escherichia coli bacteremia, diabetes, acute respiratory failure with hypoxia   Urine retention -Resolved most likely was secondary to patient's UTI -Flomax   DM Type 2 controlled with  complications -3/41 Hemoglobin A1c= 5.8 -Diet controlled. -Counseled on sequela of uncontrolled diabetes to include blindness, impotence, neuropathy, DEATH   Hip Pain Acute on Chronic  - DG Hip: Negative for OA    Discharge Diagnoses:  Principal Problem:   Sepsis secondary to UTI ([place]) Active Problems:   BPH (benign prostatic hyperplasia)   Borderline diabetes   COPD (chronic obstructive pulmonary disease) (HCC)   Chronic diarrhea   Elevated troponin   Coronary artery calcification seen on CAT scan   Palliative care by specialist   Acute respiratory failure with hypoxia (Benjamin [name])   COPD with acute exacerbation (H[facility])   Chronic diastolic CHF (congestive heart failure) (HCC)   Right lower quadrant abdominal pain   Irritable bowel syndrome with diarrhea   Bacteremia due to Escherichia coli   Urine retention   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 WM PMHx  BPH; GERD; COPD; and chronic GI issues (N/V/D)    Presenting with acute febrile illness.  Thursday, he went to GI.  He was given 2 medications - Colestid and Dicyclomine.  After that, "shit hit the fan."  Starting about 0500 Sunday AM, his back, shoulder, and neck started to hurt badly.  He thought he slept wrong.  Vicodin didn't phase it.  He tried to stand up and fell down 3 different times.  Then he started to hurt - joints, muscles.  He started with nonproductive cough.  +SOB.  He tried  using albuterol without improvement.  Called GI Monday AM and they said to stop the new medications - he did that Sunday night.  They went to PCP today and they called 911.  Tmax 102.4 here and he has been shivering.  He is very hungry but not interested in food.   He has had ongoing chronic nausea without improvement.  Chronic diarrhea (these medications were supposed to stop the diarrhea).  He has not had further diarrhea.  He has been "weak as a kitten".  During this hospitalization treated for acute respiratory failure with hypoxia/COPD with resolution of his increased WOB/SOB. In addition patient diagnosed with chronic diastolic CHF evaluated by cardiology and once patient completed course antibiotics will complete his cardiac workup as outpatient. In addition patient was treated for Escherichia coli sepsis UTI/bacteremia. Will complete antibiotics as an outpatient. Stable for discharge    Procedures: 8/23 Echocardiogram: LVEF=: 50% to 55%. -Grade 1 diastolic dysfunction. - Pericardium, extracardiac: A small pericardial effusion no hemodynamic compromise     Consultations: Cardiology PALLIATIVE CARE    Cultures  8/21 urine positive Escherichia coli 8/21 blood positive Escherichia coli 8/22 MRSA by PCR negative 8/24 stool negative Benjamin [name] 8/24 stool positive Escherichia coli     Antibiotics Anti-infectives    Start     Stop   05/12/17 0000  Cefixime (SUPRAX) 400 MG CAPS capsule         05/09/17 1600  Cefixime (SUPRAX) capsule 400 mg         05/07/17 1000  piperacillin-tazobactam (ZOSYN) IVPB 3.375 g  Status:  Discontinued     05/09/17 1458   05/07/17 0400  vancomycin (VANCOCIN) IVPB 750 mg/150 ml premix  Status:  Discontinued     05/07/17 1230   05/06/17 2345  cefTRIAXone (ROCEPHIN) 2 g in dextrose 5 % 50 mL IVPB  Status:  Discontinued     05/07/17 0328   05/06/17 1615  piperacillin-tazobactam (ZOSYN) IVPB 3.375 g     05/06/17 1710   05/06/17 1615  vancomycin (VANCOCIN) IVPB 1000 mg/200 mL premix     08 /21/18 1819       Discharge Exam: Vitals:   05/11/17 0438 05/11/17 0740 05/11/17 0802 05/11/17 1221  BP: 115/71 117/72  110/63  Pulse: 76 75  84  Resp: 19 (!) 21  (!) 24  Temp: 98.2 F (36.8 C) 98.2 F (36.8 C)   97.8 F (36.6 C)  TempSrc: Oral Oral  Oral  SpO2: 95% 91% 93% 91%  Weight: 188 lb 4.4 oz (85.4 kg)     Height:        General: A/O 4, negative acute respiratory distress Cardiovascular: Regular rhythm and rate, negative murmurs rubs or gallops, normal S1/S2 Respiratory: Clear to auscultation bilateral, except for mild expiratory wheezing    Discharge Instructions   Allergies as of 05/11/2017      Reactions   Arsenic    Severe swelling if patient comes in contact    Contrast Media [iodinated Diagnostic Agents]       Medication List    STOP taking these medications   colestipol 1 g tablet Commonly known as:  COLESTID   dicyclomine 10 MG capsule Commonly known as:  BENTYL     TAKE these medications   acetaminophen 500 MG tablet Commonly known as:  TYLENOL Take 1,000 mg by mouth every 6 (six) hours as needed for moderate pain or headache.   albuterol 108 (90 Base) MCG/ACT inhaler Commonly known as:  PROVENTIL HFA;VENTOLIN HFA  Inhale 2 puffs into the lungs every 6 (six) hours as needed for wheezing or shortness of breath.   Cefixime 400 MG Caps capsule Commonly known as:  SUPRAX Take 1 capsule (400 mg total) by mouth daily.   diphenhydrAMINE 25 MG tablet Commonly known as:  BENADRYL Take 25-50 mg by mouth every 6 (six) hours as needed for allergies.   famotidine 20 MG tablet Commonly known as:  PEPCID Take 20 mg by mouth daily as needed for heartburn or indigestion.   HYDROcodone-acetaminophen 5-325 MG tablet Commonly known as:  NORCO Take 1 tablet by mouth every 6 (six) hours as needed for moderate pain.   loperamide 2 MG capsule Commonly known as:  IMODIUM Take 1 capsule (2 mg total) by mouth as needed for diarrhea or loose stools.   promethazine 12.5 MG tablet Commonly known as:  PHENERGAN Take 1 tablet (12.5 mg total) by mouth every 8 (eight) hours as needed for nausea or vomiting.   RABEprazole 20 MG tablet Commonly known as:  ACIPHEX Take 1 tablet  (20 mg total) by mouth daily.   tamsulosin 0.4 MG Caps capsule Commonly known as:  FLOMAX TAKE 1 CAPSULE(0.4 MG) BY MOUTH DAILY   traMADol 50 MG tablet Commonly known as:  ULTRAM Take 50-100 mg by mouth every 6 (six) hours as needed for moderate pain.            Durable Medical Equipment        Start     Ordered   05/11/17 1317  For home use only DME Walker rolling  Once    Question:  Patient needs a walker to treat with the following condition  Answer:  CHF (congestive heart failure) ([place])   05/11/17 1317   05/11/17 1315  For home use only DME Bedside commode  Once    Question:  Patient needs a bedside commode to treat with the following condition  Answer:  CHF (congestive heart failure) ([place])   05/11/17 1317       Discharge Care Instructions        Start     Ordered   05/12/17 0000  Cefixime (SUPRAX) 400 MG CAPS capsule  Daily     05/11/17 1323   05/11/17 0000  loperamide (IMODIUM) 2 MG capsule  As needed     05/11/17 1323   05/11/17 0000  albuterol (PROVENTIL HFA;VENTOLIN HFA) 108 (90 Base) MCG/ACT inhaler  Every 6 hours PRN     05/11/17 1429     Allergies  Allergen Reactions  . Arsenic     Severe swelling if patient comes in contact   . Contrast Media [Iodinated Diagnostic Agents]    Follow-up Information    Sueanne Margarita, MD. Schedule an appointment as soon as possible for a visit in 2 week(s).   Specialty:  Cardiology Why:  Scheduled follow-up with Benjamin [name] in 2 weeks, Chronic Diastolic CHF, . Appointment: June 02, 2017 @ 12:45 pm Contact information: [address] [place] [address] [phone]        Alycia Rossetti, MD. Schedule an appointment as soon as possible for a visit in 3 week(s).   Specialty:  Family Medicine Why:  Schedule Follow-up with Benjamin [name], in 2-3 weeks UTI sepsis, Escherichia coli bacteremia, diabetes, acute respiratory failure with hypoxia Contact information: [address] [place] [address] [phone]        Rigoberto Noel, MD. Schedule an appointment as  soon as possible for a visit in 6 week(s).   Specialty:  Pulmonary Disease Why:  Schedule an establish care appointment in 4-6 weeks with Benjamin [name] PCCM, requires spirometry pre-/post bronchodilator, DLCO to officially diagnose COPD  Contact information: [address] [place] [address] [phone]        [place] Follow up.   Why:  RW and 3n1 will be delivered to your room prior to discharge Contact information: [address] [place] [address] [phone]        [facility] Follow up.   Why:  PT/OT/Resp Therapy will be provided by above agency and a rep will be in touch with you within 24 hours of discharge to arrange initial visit.  Contact information: [address] [place]  [address] [phone]            The results of significant diagnostics from this hospitalization (including imaging, microbiology, ancillary and laboratory) are listed below for reference.    Significant Diagnostic Studies: Ct Abdomen Pelvis Wo Contrast  Result Date: 05/06/2017 CLINICAL DATA:  Abdominal pain, unspecified. Nonproductive cough. Chills. Dysuria and incontinence. EXAM: CT CHEST, ABDOMEN AND PELVIS WITHOUT CONTRAST TECHNIQUE: Multidetector CT imaging of the chest, abdomen and pelvis was performed following the standard protocol without IV contrast. COMPARISON:  Chest radiographs earlier this day. Abdominal radiograph 05/01/2017. No prior CT. FINDINGS: CT CHEST FINDINGS Cardiovascular: Atherosclerosis and tortuosity of the thoracic aorta. No aneurysm. There are coronary artery calcifications. Heart is normal in size. Small focal pericardial fluid anteriorly. Mediastinum/Nodes: No mediastinal or evidence of hilar adenopathy allowing for lack of IV contrast. Visualized thyroid gland is normal. The esophagus is decompressed.  Lungs/Pleura: Moderate apical predominant emphysema. Linear/bandlike opacity in the posterior right lower lobe has a few internal air bronchograms. There is dependent atelectasis in the right lower lobe. Linear atelectasis in the left lower lobe. No pulmonary edema. No pleural fluid. Musculoskeletal: There are no acute or suspicious osseous abnormalities. CT ABDOMEN PELVIS FINDINGS Hepatobiliary: Mild hepatic steatosis. No evidence of focal hepatic lesion allowing for lack contrast. Gallbladder minimally distended, no calcified gallstone or pericholecystic inflammation. No biliary dilatation. Pancreas: Parenchymal atrophy with scattered calcifications about pancreatic head and uncinate process. No ductal dilatation or inflammation. Spleen: Normal in size without focal abnormality. Adrenals/Urinary Tract: Low-density 18 mm left adrenal nodule consistent with adenoma. The right adrenal gland is normal. No hydronephrosis. Bilateral perinephric edema is nonspecific, left greater than right. No urolithiasis. Ureters are decompressed. Urinary bladder is physiologically distended. Mild bladder wall thickening posteriorly. Stomach/Bowel: Despite repeat exam, motion artifact partially limits evaluation. Colonic diverticulosis is most prominent in the sigmoid colon no definite diverticulitis. The appendix is surgically absent. No bowel obstruction or evidence of inflammation. Stomach physiologically distended. Tiny hiatal hernia. Vascular/Lymphatic: Dense aortic atherosclerosis. No aneurysm. No bulky adenopathy, lack contrast and motion artifact obscures detailed evaluation. Reproductive: Enlarged prostate gland spanning 6.7 cm transverse. Other: No ascites or free air. No intra-abdominal abscess. Tiny fat containing umbilical hernia. Musculoskeletal: There are no acute or suspicious osseous abnormalities. Mild degenerative change in the spine. IMPRESSION: CT CHEST IMPRESSION: 1. Streaky right lower lobe opacity with air  bronchograms, likely atelectasis or scarring. Dependent atelectasis in the right greater than left lower lobe. 2. Moderate emphysema. 3.  Aortic atherosclerosis and coronary artery calcifications. CT ABDOMEN/PELVIS IMPRESSION: 1. Mild nonspecific perinephric edema and bladder wall thickening, can be seen in the setting of urinary tract infection. 2. Colonic diverticulosis without  acute inflammation. 3. Enlarged prostate gland. 4. Hepatic steatosis.  Left adrenal adenoma. 5. Aortic atherosclerosis. Aortic Atherosclerosis (ICD10-I70.0) and Emphysema (ICD10-J43.9). Electronically Signed   By: Jeb Levering M.D.   On: 05/06/2017 21:52   Ct Chest Wo Contrast  Result Date: 05/06/2017 CLINICAL DATA:  Abdominal pain, unspecified. Nonproductive cough. Chills. Dysuria and incontinence. EXAM: CT CHEST, ABDOMEN AND PELVIS WITHOUT CONTRAST TECHNIQUE: Multidetector CT imaging of the chest, abdomen and pelvis was performed following the standard protocol without IV contrast. COMPARISON:  Chest radiographs earlier this day. Abdominal radiograph 05/01/2017. No prior CT. FINDINGS: CT CHEST FINDINGS Cardiovascular: Atherosclerosis and tortuosity of the thoracic aorta. No aneurysm. There are coronary artery calcifications. Heart is normal in size. Small focal pericardial fluid anteriorly. Mediastinum/Nodes: No mediastinal or evidence of hilar adenopathy allowing for lack of IV contrast. Visualized thyroid gland is normal. The esophagus is decompressed. Lungs/Pleura: Moderate apical predominant emphysema. Linear/bandlike opacity in the posterior right lower lobe has a few internal air bronchograms. There is dependent atelectasis in the right lower lobe. Linear atelectasis in the left lower lobe. No pulmonary edema. No pleural fluid. Musculoskeletal: There are no acute or suspicious osseous abnormalities. CT ABDOMEN PELVIS FINDINGS Hepatobiliary: Mild hepatic steatosis. No evidence of focal hepatic lesion allowing for lack  contrast. Gallbladder minimally distended, no calcified gallstone or pericholecystic inflammation. No biliary dilatation. Pancreas: Parenchymal atrophy with scattered calcifications about pancreatic head and uncinate process. No ductal dilatation or inflammation. Spleen: Normal in size without focal abnormality. Adrenals/Urinary Tract: Low-density 18 mm left adrenal nodule consistent with adenoma. The right adrenal gland is normal. No hydronephrosis. Bilateral perinephric edema is nonspecific, left greater than right. No urolithiasis. Ureters are decompressed. Urinary bladder is physiologically distended. Mild bladder wall thickening posteriorly. Stomach/Bowel: Despite repeat exam, motion artifact partially limits evaluation. Colonic diverticulosis is most prominent in the sigmoid colon no definite diverticulitis. The appendix is surgically absent. No bowel obstruction or evidence of inflammation. Stomach physiologically distended. Tiny hiatal hernia. Vascular/Lymphatic: Dense aortic atherosclerosis. No aneurysm. No bulky adenopathy, lack contrast and motion artifact obscures detailed evaluation. Reproductive: Enlarged prostate gland spanning 6.7 cm transverse. Other: No ascites or free air. No intra-abdominal abscess. Tiny fat containing umbilical hernia. Musculoskeletal: There are no acute or suspicious osseous abnormalities. Mild degenerative change in the spine. IMPRESSION: CT CHEST IMPRESSION: 1. Streaky right lower lobe opacity with air bronchograms, likely atelectasis or scarring. Dependent atelectasis in the right greater than left lower lobe. 2. Moderate emphysema. 3.  Aortic atherosclerosis and coronary artery calcifications. CT ABDOMEN/PELVIS IMPRESSION: 1. Mild nonspecific perinephric edema and bladder wall thickening, can be seen in the setting of urinary tract infection. 2. Colonic diverticulosis without acute inflammation. 3. Enlarged prostate gland. 4. Hepatic steatosis.  Left adrenal adenoma. 5.  Aortic atherosclerosis. Aortic Atherosclerosis (ICD10-I70.0) and Emphysema (ICD10-J43.9). Electronically Signed   By: Jeb Levering M.D.   On: 05/06/2017 21:52   Dg Chest Port 1 View  Result Date: 05/10/2017 CLINICAL DATA:  COPD exacerbation EXAM: PORTABLE CHEST 1 VIEW COMPARISON:  05/07/2017 FINDINGS: Cardiac shadow is stable. Aortic calcifications are again seen. The lungs are well aerated bilaterally. Mild atelectatic changes are noted in the left base stable from the previous exam. No new focal abnormality is noted. No bony abnormality is seen. IMPRESSION: Stable left basilar atelectasis. Electronically Signed   By: Inez Catalina M.D.   On: 05/10/2017 14:27   Dg Chest Port 1 View  Result Date: 05/07/2017 CLINICAL DATA:  Respiratory distress this evening. EXAM: PORTABLE CHEST 1 VIEW COMPARISON:  CT from 05/06/2017 FINDINGS: Low lung volumes with bibasilar atelectasis. Superimposed pneumonia is at the lung bases would be difficult to entirely exclude. Heart is top-normal in size with aortic atherosclerosis. No acute nor suspicious osseous abnormalities IMPRESSION: 1. Streaky and patchy airspace opacities at the lung bases more likely to represent atelectasis and/or scarring. Superimposed minimal pneumonia would be difficult to entirely exclude. 2. Aortic atherosclerosis. Electronically Signed   By: Ashley Royalty M.D.   On: 05/07/2017 03:24   Dg Chest Port 1 View  Result Date: 05/06/2017 CLINICAL DATA:  Fever and shortness of breath EXAM: PORTABLE CHEST 1 VIEW COMPARISON:  01/13/2015 FINDINGS: 1623 hours. Asymmetric elevation right hemidiaphragm. The lungs are clear without focal pneumonia, edema, pneumothorax or pleural effusion. Cardiopericardial silhouette is at upper limits of normal for size. The visualized bony structures of the thorax are intact. Telemetry leads overlie the chest. IMPRESSION: No active disease. Electronically Signed   By: Misty Stanley M.D.   On: 05/06/2017 16:36   Dg Abd 2  Views  Result Date: 05/01/2017 CLINICAL DATA:  Chronic diarrhea, abdominal pain, bloating, and nausea. EXAM: ABDOMEN - 2 VIEW COMPARISON:  None in PACs FINDINGS: There is a small amount of gas within a minimally distended small bowel loop to the left of midline. There is a moderate amount of stool and gas within the colon and rectum. No free extraluminal gas collections are observed. There are punctate calcifications projecting over the renal fossae bilaterally which could reflect tiny stones. There are pelvic phleboliths. The bony structures exhibit no acute abnormalities. IMPRESSION: No definite acute intra-abdominal abnormality is observed. Given the patient's symptoms, abdominal and pelvic CT scanning would likely be the most useful next imaging steps. Electronically Signed   By: David  Martinique M.D.   On: 05/01/2017 14:40   Dg Hips Bilat With Pelvis 3-4 Views  Result Date: 05/08/2017 CLINICAL DATA:  Acute bilateral hip pain without known injury. EXAM: DG HIP (WITH OR WITHOUT PELVIS) 3-4V BILAT COMPARISON:  None. FINDINGS: There is no evidence of hip fracture or dislocation. There is no evidence of arthropathy or other focal bone abnormality. IMPRESSION: Normal bilateral hips. Electronically Signed   By: Marijo Conception, M.D.   On: 05/08/2017 18:55    Microbiology: Recent Results (from the past 240 hour(s))  Urine culture     Status: Abnormal   Collection Time: 05/06/17  3:58 PM  Result Value Ref Range Status   Specimen Description URINE, RANDOM  Final   Special Requests NONE  Final   Culture >=100,000 COLONIES/mL ESCHERICHIA COLI (A)  Final   Report Status 05/09/2017 FINAL  Final   Organism ID, Bacteria ESCHERICHIA COLI (A)  Final      Susceptibility   Escherichia coli - MIC*    AMPICILLIN >=32 RESISTANT Resistant     CEFAZOLIN <=4 SENSITIVE Sensitive     CEFTRIAXONE <=1 SENSITIVE Sensitive     CIPROFLOXACIN <=0.25 SENSITIVE Sensitive     GENTAMICIN <=1 SENSITIVE Sensitive     IMIPENEM  <=0.25 SENSITIVE Sensitive     NITROFURANTOIN <=16 SENSITIVE Sensitive     TRIMETH/SULFA <=20 SENSITIVE Sensitive     AMPICILLIN/SULBACTAM >=32 RESISTANT Resistant     PIP/TAZO <=4 SENSITIVE Sensitive     Extended ESBL NEGATIVE Sensitive     * >=100,000 COLONIES/mL ESCHERICHIA COLI  Culture, blood (Routine x 2)     Status: Abnormal   Collection Time: 05/06/17  4:13 PM  Result Value Ref Range Status   Specimen Description BLOOD RIGHT  ANTECUBITAL  Final   Special Requests   Final    BOTTLES DRAWN AEROBIC AND ANAEROBIC Blood Culture adequate volume   Culture  Setup Time   Final    GRAM NEGATIVE RODS ANAEROBIC BOTTLE ONLY CRITICAL RESULT CALLED TO, READ BACK BY AND VERIFIED WITH: M TURNER 05/07/17 @ 70 M VESTAL    Culture ESCHERICHIA COLI (A)  Final   Report Status 05/09/2017 FINAL  Final   Organism ID, Bacteria ESCHERICHIA COLI  Final      Susceptibility   Escherichia coli - MIC*    AMPICILLIN >=32 RESISTANT Resistant     CEFAZOLIN <=4 SENSITIVE Sensitive     CEFEPIME <=1 SENSITIVE Sensitive     CEFTAZIDIME <=1 SENSITIVE Sensitive     CEFTRIAXONE <=1 SENSITIVE Sensitive     CIPROFLOXACIN <=0.25 SENSITIVE Sensitive     GENTAMICIN <=1 SENSITIVE Sensitive     IMIPENEM <=0.25 SENSITIVE Sensitive     TRIMETH/SULFA <=20 SENSITIVE Sensitive     AMPICILLIN/SULBACTAM >=32 RESISTANT Resistant     PIP/TAZO <=4 SENSITIVE Sensitive     Extended ESBL NEGATIVE Sensitive     * ESCHERICHIA COLI  Blood Culture ID Panel (Reflexed)     Status: Abnormal   Collection Time: 05/06/17  4:13 PM  Result Value Ref Range Status   Enterococcus species NOT DETECTED NOT DETECTED Final   Vancomycin resistance NOT DETECTED NOT DETECTED Final   Listeria monocytogenes NOT DETECTED NOT DETECTED Final   Staphylococcus species NOT DETECTED NOT DETECTED Final   Staphylococcus aureus NOT DETECTED NOT DETECTED Final   Methicillin resistance NOT DETECTED NOT DETECTED Final   Streptococcus species NOT DETECTED NOT  DETECTED Final   Streptococcus agalactiae NOT DETECTED NOT DETECTED Final   Streptococcus pneumoniae NOT DETECTED NOT DETECTED Final   Streptococcus pyogenes NOT DETECTED NOT DETECTED Final   Acinetobacter baumannii NOT DETECTED NOT DETECTED Final   Enterobacteriaceae species DETECTED (A) NOT DETECTED Final    Comment: CRITICAL RESULT CALLED TO, READ BACK BY AND VERIFIED WITH: M TURNER 05/07/17 @ 0919 M VESTAL    Enterobacter cloacae complex NOT DETECTED NOT DETECTED Final   Escherichia coli DETECTED (A) NOT DETECTED Final    Comment: CRITICAL RESULT CALLED TO, READ BACK BY AND VERIFIED WITH: M TURNER 05/07/17 @ 0919 M VESTAL    Klebsiella oxytoca NOT DETECTED NOT DETECTED Final   Klebsiella pneumoniae NOT DETECTED NOT DETECTED Final   Proteus species NOT DETECTED NOT DETECTED Final   Serratia marcescens NOT DETECTED NOT DETECTED Final   Carbapenem resistance NOT DETECTED NOT DETECTED Final   Haemophilus influenzae NOT DETECTED NOT DETECTED Final   Neisseria meningitidis NOT DETECTED NOT DETECTED Final   Pseudomonas aeruginosa NOT DETECTED NOT DETECTED Final   Candida albicans NOT DETECTED NOT DETECTED Final   Candida glabrata NOT DETECTED NOT DETECTED Final   Candida krusei NOT DETECTED NOT DETECTED Final   Candida parapsilosis NOT DETECTED NOT DETECTED Final   Candida tropicalis NOT DETECTED NOT DETECTED Final  Culture, blood (Routine x 2)     Status: None   Collection Time: 05/06/17  4:35 PM  Result Value Ref Range Status   Specimen Description BLOOD LEFT ANTECUBITAL  Final   Special Requests   Final    BOTTLES DRAWN AEROBIC AND ANAEROBIC Blood Culture adequate volume   Culture NO GROWTH 5 DAYS  Final   Report Status 05/11/2017 FINAL  Final  MRSA PCR Screening     Status: None   Collection Time: 05/07/17  4:11  AM  Result Value Ref Range Status   MRSA by PCR NEGATIVE NEGATIVE Final    Comment:        The GeneXpert MRSA Assay (FDA approved for NASAL specimens only), is one  component of a comprehensive MRSA colonization surveillance program. It is not intended to diagnose MRSA infection nor to guide or monitor treatment for MRSA infections.   C [name] quick scan w PCR reflex     Status: None   Collection Time: 05/09/17  5:12 AM  Result Value Ref Range Status   C Diff antigen NEGATIVE NEGATIVE Final   C Diff toxin NEGATIVE NEGATIVE Final   C Diff interpretation No Benjamin [name] detected.  Final  Gastrointestinal Panel by PCR , Stool     Status: Abnormal   Collection Time: 05/09/17  5:12 AM  Result Value Ref Range Status   Campylobacter species NOT DETECTED NOT DETECTED Final   Plesimonas shigelloides NOT DETECTED NOT DETECTED Final   Salmonella species NOT DETECTED NOT DETECTED Final   Yersinia enterocolitica NOT DETECTED NOT DETECTED Final   Vibrio species NOT DETECTED NOT DETECTED Final   Vibrio cholerae NOT DETECTED NOT DETECTED Final   Enteroaggregative E coli (EAEC) NOT DETECTED NOT DETECTED Final   Enteropathogenic E coli (EPEC) DETECTED (A) NOT DETECTED Final    Comment: RESULT CALLED TO, READ BACK BY AND VERIFIED WITH: EVELYN OFORI 05/09/17 @ 4008  [name]    Enterotoxigenic E coli (ETEC) NOT DETECTED NOT DETECTED Final   Shiga like toxin producing E coli (STEC) NOT DETECTED NOT DETECTED Final   Shigella/Enteroinvasive E coli (EIEC) NOT DETECTED NOT DETECTED Final   Cryptosporidium NOT DETECTED NOT DETECTED Final   Cyclospora cayetanensis NOT DETECTED NOT DETECTED Final   Entamoeba histolytica NOT DETECTED NOT DETECTED Final   Giardia lamblia NOT DETECTED NOT DETECTED Final   Adenovirus F40/41 NOT DETECTED NOT DETECTED Final   Astrovirus NOT DETECTED NOT DETECTED Final   Norovirus GI/GII NOT DETECTED NOT DETECTED Final   Rotavirus A NOT DETECTED NOT DETECTED Final   Sapovirus (I, II, IV, and V) NOT DETECTED NOT DETECTED Final     Labs: Basic Metabolic Panel:  Recent Labs Lab 05/07/17 0243 05/08/17 0350 05/09/17 0913 05/10/17 0244  05/11/17 0346  NA 137 139 138 139 140  K 3.9 3.7 3.7 3.5 4.0  CL 108 109 109 108 109  CO2 16* 23 21* 23 23  GLUCOSE 133* 99 109* 224* 156*  BUN 13 17 11 11 17   CREATININE 1.14 1.01 0.95 0.92 0.87  CALCIUM 8.2* 8.2* 8.0* 8.7* 8.7*  MG  --  2.0 1.6* 1.9 1.9  PHOS  --  2.0*  --   --   --    Liver Function Tests:  Recent Labs Lab 05/06/17 1613  AST 29  ALT 15*  ALKPHOS 68  BILITOT 1.8*  PROT 6.9  ALBUMIN 3.5    Recent Labs Lab 05/06/17 1613  LIPASE 21   No results for input(s): AMMONIA in the last 168 hours. CBC:  Recent Labs Lab 05/06/17 1613 05/07/17 0243 05/08/17 0350 05/10/17 0244 05/11/17 0346  WBC 15.0* 5.3 10.0 3.6* 9.2  NEUTROABS 13.1*  --   --  3.1  --   HGB 15.5 15.0 12.8* 13.2 12.3*  HCT 44.4 44.7 37.7* 38.9* 36.1*  MCV 90.6 92.5 92.2 90.0 90.7  PLT 193 175 190 238 296   Cardiac Enzymes:  Recent Labs Lab 05/07/17 0243 05/07/17 0913 05/07/17 1501 05/09/17 1135  TROPONINI 0.05*  1.81* 1.12* 0.17*   BNP: BNP (last 3 results) No results for input(s): BNP in the last 8760 hours.  ProBNP (last 3 results) No results for input(s): PROBNP in the last 8760 hours.  CBG:  Recent Labs Lab 05/10/17 1939 05/11/17 0004 05/11/17 0440 05/11/17 0739 05/11/17 1220  GLUCAP 159* 182* 142* 124* 125*       Signed:  Dia Crawford, MD [facility] [phone] pager

## 2017-05-12 ENCOUNTER — Telehealth: Payer: Self-pay | Admitting: *Deleted

## 2017-05-12 DIAGNOSIS — K580 Irritable bowel syndrome with diarrhea: Secondary | ICD-10-CM | POA: Diagnosis not present

## 2017-05-12 DIAGNOSIS — I5032 Chronic diastolic (congestive) heart failure: Secondary | ICD-10-CM | POA: Diagnosis not present

## 2017-05-12 DIAGNOSIS — J441 Chronic obstructive pulmonary disease with (acute) exacerbation: Secondary | ICD-10-CM | POA: Diagnosis not present

## 2017-05-12 DIAGNOSIS — I2584 Coronary atherosclerosis due to calcified coronary lesion: Secondary | ICD-10-CM | POA: Diagnosis not present

## 2017-05-12 DIAGNOSIS — Z87891 Personal history of nicotine dependence: Secondary | ICD-10-CM | POA: Diagnosis not present

## 2017-05-12 DIAGNOSIS — B9620 Unspecified Escherichia coli [E. coli] as the cause of diseases classified elsewhere: Secondary | ICD-10-CM | POA: Diagnosis not present

## 2017-05-12 DIAGNOSIS — G8929 Other chronic pain: Secondary | ICD-10-CM | POA: Diagnosis not present

## 2017-05-12 DIAGNOSIS — E119 Type 2 diabetes mellitus without complications: Secondary | ICD-10-CM | POA: Diagnosis not present

## 2017-05-12 DIAGNOSIS — N390 Urinary tract infection, site not specified: Secondary | ICD-10-CM | POA: Diagnosis not present

## 2017-05-12 NOTE — Telephone Encounter (Signed)
Received call from patient wife, Benjamin [name].   Patient released from hospital on 05/11/2017. States that patient continues to voice c/o pain in penis. Advised to use pain medication PRN. Also states that patient is requesting orders for nebulizer for albuterol.   Ok to order?

## 2017-05-13 MED ORDER — ALBUTEROL SULFATE (2.5 MG/3ML) 0.083% IN NEBU: 2.5000 mg | INHALATION_SOLUTION | Freq: Four times a day (QID) | RESPIRATORY_TRACT | 1 refills | Status: DC | PRN

## 2017-05-13 NOTE — Telephone Encounter (Signed)
He problem has pain from catheter may be a small cut, as long as he can uriante and no blood should resolve on its own  Okay to send albuterol 2.5mg  neb, okay to get Nebuluzer, dx COPD

## 2017-05-13 NOTE — Telephone Encounter (Signed)
Prescription printed for DME.  Call placed to patient and patient wife Benjamin [name] made aware.

## 2017-05-14 ENCOUNTER — Other Ambulatory Visit: Payer: Self-pay | Admitting: *Deleted

## 2017-05-14 DIAGNOSIS — B9620 Unspecified Escherichia coli [E. coli] as the cause of diseases classified elsewhere: Secondary | ICD-10-CM | POA: Diagnosis not present

## 2017-05-14 DIAGNOSIS — I5032 Chronic diastolic (congestive) heart failure: Secondary | ICD-10-CM | POA: Diagnosis not present

## 2017-05-14 DIAGNOSIS — E119 Type 2 diabetes mellitus without complications: Secondary | ICD-10-CM | POA: Diagnosis not present

## 2017-05-14 DIAGNOSIS — J441 Chronic obstructive pulmonary disease with (acute) exacerbation: Secondary | ICD-10-CM | POA: Diagnosis not present

## 2017-05-14 DIAGNOSIS — K580 Irritable bowel syndrome with diarrhea: Secondary | ICD-10-CM | POA: Diagnosis not present

## 2017-05-14 DIAGNOSIS — N390 Urinary tract infection, site not specified: Secondary | ICD-10-CM | POA: Diagnosis not present

## 2017-05-14 MED ORDER — ALBUTEROL SULFATE (2.5 MG/3ML) 0.083% IN NEBU: 2.5000 mg | INHALATION_SOLUTION | Freq: Four times a day (QID) | RESPIRATORY_TRACT | 1 refills | Status: DC | PRN

## 2017-05-16 DIAGNOSIS — E119 Type 2 diabetes mellitus without complications: Secondary | ICD-10-CM | POA: Diagnosis not present

## 2017-05-16 DIAGNOSIS — I5032 Chronic diastolic (congestive) heart failure: Secondary | ICD-10-CM | POA: Diagnosis not present

## 2017-05-16 DIAGNOSIS — N390 Urinary tract infection, site not specified: Secondary | ICD-10-CM | POA: Diagnosis not present

## 2017-05-16 DIAGNOSIS — K580 Irritable bowel syndrome with diarrhea: Secondary | ICD-10-CM | POA: Diagnosis not present

## 2017-05-16 DIAGNOSIS — J441 Chronic obstructive pulmonary disease with (acute) exacerbation: Secondary | ICD-10-CM | POA: Diagnosis not present

## 2017-05-16 DIAGNOSIS — B9620 Unspecified Escherichia coli [E. coli] as the cause of diseases classified elsewhere: Secondary | ICD-10-CM | POA: Diagnosis not present

## 2017-05-19 DIAGNOSIS — N390 Urinary tract infection, site not specified: Secondary | ICD-10-CM | POA: Diagnosis not present

## 2017-05-19 DIAGNOSIS — B9620 Unspecified Escherichia coli [E. coli] as the cause of diseases classified elsewhere: Secondary | ICD-10-CM | POA: Diagnosis not present

## 2017-05-19 DIAGNOSIS — E119 Type 2 diabetes mellitus without complications: Secondary | ICD-10-CM | POA: Diagnosis not present

## 2017-05-19 DIAGNOSIS — J441 Chronic obstructive pulmonary disease with (acute) exacerbation: Secondary | ICD-10-CM | POA: Diagnosis not present

## 2017-05-19 DIAGNOSIS — I5032 Chronic diastolic (congestive) heart failure: Secondary | ICD-10-CM | POA: Diagnosis not present

## 2017-05-19 DIAGNOSIS — K580 Irritable bowel syndrome with diarrhea: Secondary | ICD-10-CM | POA: Diagnosis not present

## 2017-05-21 DIAGNOSIS — J441 Chronic obstructive pulmonary disease with (acute) exacerbation: Secondary | ICD-10-CM | POA: Diagnosis not present

## 2017-05-21 DIAGNOSIS — N390 Urinary tract infection, site not specified: Secondary | ICD-10-CM | POA: Diagnosis not present

## 2017-05-21 DIAGNOSIS — K580 Irritable bowel syndrome with diarrhea: Secondary | ICD-10-CM | POA: Diagnosis not present

## 2017-05-21 DIAGNOSIS — B9620 Unspecified Escherichia coli [E. coli] as the cause of diseases classified elsewhere: Secondary | ICD-10-CM | POA: Diagnosis not present

## 2017-05-21 DIAGNOSIS — I5032 Chronic diastolic (congestive) heart failure: Secondary | ICD-10-CM | POA: Diagnosis not present

## 2017-05-21 DIAGNOSIS — E119 Type 2 diabetes mellitus without complications: Secondary | ICD-10-CM | POA: Diagnosis not present

## 2017-05-23 ENCOUNTER — Encounter: Payer: Self-pay | Admitting: Family Medicine

## 2017-05-23 ENCOUNTER — Ambulatory Visit (INDEPENDENT_AMBULATORY_CARE_PROVIDER_SITE_OTHER): Payer: Medicare Other | Admitting: Family Medicine

## 2017-05-23 VITALS — BP 108/72 | HR 87 | Temp 97.3°F | Resp 16 | Ht 71.0 in

## 2017-05-23 DIAGNOSIS — J441 Chronic obstructive pulmonary disease with (acute) exacerbation: Secondary | ICD-10-CM | POA: Diagnosis not present

## 2017-05-23 DIAGNOSIS — J410 Simple chronic bronchitis: Secondary | ICD-10-CM | POA: Diagnosis not present

## 2017-05-23 DIAGNOSIS — I5032 Chronic diastolic (congestive) heart failure: Secondary | ICD-10-CM

## 2017-05-23 DIAGNOSIS — K580 Irritable bowel syndrome with diarrhea: Secondary | ICD-10-CM

## 2017-05-23 DIAGNOSIS — E119 Type 2 diabetes mellitus without complications: Secondary | ICD-10-CM

## 2017-05-23 DIAGNOSIS — B9620 Unspecified Escherichia coli [E. coli] as the cause of diseases classified elsewhere: Secondary | ICD-10-CM | POA: Diagnosis not present

## 2017-05-23 DIAGNOSIS — M791 Myalgia, unspecified site: Secondary | ICD-10-CM

## 2017-05-23 DIAGNOSIS — R339 Retention of urine, unspecified: Secondary | ICD-10-CM | POA: Diagnosis not present

## 2017-05-23 DIAGNOSIS — N390 Urinary tract infection, site not specified: Secondary | ICD-10-CM | POA: Diagnosis not present

## 2017-05-23 DIAGNOSIS — R7881 Bacteremia: Secondary | ICD-10-CM

## 2017-05-23 LAB — URINALYSIS, ROUTINE W REFLEX MICROSCOPIC
BILIRUBIN URINE: NEGATIVE
Glucose, UA: NEGATIVE
Hgb urine dipstick: NEGATIVE
Ketones, ur: NEGATIVE
LEUKOCYTES UA: NEGATIVE
NITRITE: NEGATIVE
PROTEIN: NEGATIVE
SPECIFIC GRAVITY, URINE: 1.025 (ref 1.001–1.03)
pH: 5.5 (ref 5.0–8.0)

## 2017-05-23 MED ORDER — CIPROFLOXACIN HCL 250 MG PO TABS: 500.0000 mg | ORAL_TABLET | Freq: Two times a day (BID) | ORAL | 0 refills | Status: DC

## 2017-05-23 NOTE — Patient Instructions (Addendum)
Call  labuer pulmonary for PFT  Keep appointment cardiology  COntinue all other medications Take the new antibiotics cipro Only use the albuterol as needed  F/U 3 months

## 2017-05-23 NOTE — Progress Notes (Signed)
Subjective:    Patient ID: Benjamin [name], male    DOB: [birth year], 76 y.o.   MRN: [id number]  Patient presents for Hospitalization Follow-up  Pt here for hospital F/U, admitted from my office a few weeks ago, E coli bacterimia, treated with IV antibiotics and recently completed suprax but continues to have burning and pressure with urination, also some cold chills, and muscle aches mostly in his thighs. Not sleeping well, but appetite is much improved. Still has low energy.  Acute respiratory failure- unknown cause ? Diastolic CHF vs the COPD with some anxiety mixed in. He does NOT think he has CHF and refuses to claim this diagnosis, ECHO showed dystolic dysfunction has f/u with cardiology to discuss and repeat Echo Has not scheduled with pulmonary for repeat PFT, last done in 2016 which showed COPD but he has not required any treatment  Chronic diarrhea- still present, lessened with immodium  Meds/ hospital records reviewed with patient and wife at bedside   Review Of Systems:  GEN- +fatigue, fever, weight loss,weakness, recent illness HEENT- denies eye drainage, change in vision, nasal discharge, CVS- denies chest pain, palpitations RESP- denies SOB, cough, wheeze ABD- denies N/V, change in stools, abd pain GU- +dysuria, hematuria, dribbling, incontinence MSK- denies joint pain,+ muscle aches, injury Neuro- denies headache, dizziness, syncope, seizure activity       Objective:    BP 108/72   Pulse 87   Temp (!) 97.3 F (36.3 C) (Oral)   Resp 16   Ht 5\' 11"  (1.803 m)   SpO2 96%  GEN- NAD, alert and oriented x3  Home weight 176lbs, refuses to be weighed at office  HEENT- PERRL, EOMI, non injected sclera, pink conjunctiva, MMM, oropharynx clear Neck- Supple, no thyromegaly CVS- RRR, no murmur RESP-CTAB ABD-NABS,soft,mild TTP suprapubic regionND, no CVA tenderness EXT- No edema Pulses- Radial, DP- 2+        Assessment & Plan:      Problem List Items Addressed This  Visit      Unprioritized   COPD (chronic obstructive pulmonary disease) ([place]) - Primary   Relevant Orders   Ambulatory referral to Pulmonology   Controlled type 2 diabetes mellitus without complication (HCC)   Relevant Orders   Hemoglobin A1c (Completed)   Bacteremia due to Escherichia coli   Relevant Orders   Urine Culture (Completed)   Urinalysis, Routine w reflex microscopic (Completed)   Comprehensive metabolic panel (Completed)   CBC (Completed)   Urine retention    Improved, but still symptomatic He brought in sample from home which looks good But will send for culture Start cipro BID , note he has low temp typically 97.4-97.42F      Irritable bowel syndrome with diarrhea    For now continue immodium Until everything else resolved  Then will reschedule with GI  I think he could safely try the Welchol again      Chronic diastolic CHF (congestive heart failure) (HCC)    Diastolic dysfunction noted, but significant respiratory symptoms though I do feel there was a lot of anxiety associated.   At any rate will have him re evaluated by cardiology and Pulmonary   For copd will have him stop using scheduled nebs and cut back to prn       Other Visit Diagnoses    Myalgia       Relevant Orders   CK (Completed)      Note: This dictation was prepared with Dragon dictation along with smaller phrase technology. Any  transcriptional errors that result from this process are unintentional.

## 2017-05-24 LAB — HEMOGLOBIN A1C
Hgb A1c MFr Bld: 6 % of total Hgb — ABNORMAL HIGH (ref <5.7)
MEAN PLASMA GLUCOSE: 126 (calc)
eAG (mmol/L): 7 (calc)

## 2017-05-24 LAB — COMPREHENSIVE METABOLIC PANEL
AG RATIO: 1.5 (calc) (ref 1.0–2.5)
ALBUMIN MSPROF: 3.9 g/dL (ref 3.6–5.1)
ALT: 43 U/L (ref 9–46)
AST: 27 U/L (ref 10–35)
Alkaline phosphatase (APISO): 63 U/L (ref 40–115)
BUN: 19 mg/dL (ref 7–25)
CALCIUM: 9.3 mg/dL (ref 8.6–10.3)
CO2: 24 mmol/L (ref 20–32)
CREATININE: 1.02 mg/dL (ref 0.70–1.18)
Chloride: 104 mmol/L (ref 98–110)
GLOBULIN: 2.6 g/dL (ref 1.9–3.7)
GLUCOSE: 95 mg/dL (ref 65–99)
Potassium: 4.7 mmol/L (ref 3.5–5.3)
SODIUM: 139 mmol/L (ref 135–146)
TOTAL PROTEIN: 6.5 g/dL (ref 6.1–8.1)
Total Bilirubin: 0.8 mg/dL (ref 0.2–1.2)

## 2017-05-24 LAB — CK: Total CK: 29 U/L — ABNORMAL LOW (ref 44–196)

## 2017-05-24 LAB — CBC
HCT: 47.2 % (ref 38.5–50.0)
HEMOGLOBIN: 16.2 g/dL (ref 13.2–17.1)
MCH: 32.9 pg (ref 27.0–33.0)
MCHC: 34.3 g/dL (ref 32.0–36.0)
MCV: 95.7 fL (ref 80.0–100.0)
Platelets: 420 10*3/uL — ABNORMAL HIGH (ref 140–400)
RBC: 4.93 10*6/uL (ref 4.20–5.80)
RDW: 14.5 % (ref 11.0–15.0)
WBC: 7.4 10*3/uL (ref 3.8–10.8)

## 2017-05-24 LAB — URINE CULTURE
MICRO NUMBER:: 80986020
SPECIMEN QUALITY: ADEQUATE

## 2017-05-25 ENCOUNTER — Encounter: Payer: Self-pay | Admitting: Family Medicine

## 2017-05-25 NOTE — Assessment & Plan Note (Signed)
For now continue immodium Until everything else resolved  Then will reschedule with GI  I think he could safely try the [facility] again

## 2017-05-25 NOTE — Assessment & Plan Note (Signed)
Diastolic dysfunction noted, but significant respiratory symptoms though I do feel there was a lot of anxiety associated.   At any rate will have him re evaluated by cardiology and Pulmonary   For copd will have him stop using scheduled nebs and cut back to prn

## 2017-05-25 NOTE — Assessment & Plan Note (Signed)
Improved, but still symptomatic He brought in sample from home which looks good But will send for culture Start cipro BID , note he has low temp typically 97.4-97.26F

## 2017-05-26 DIAGNOSIS — B9620 Unspecified Escherichia coli [E. coli] as the cause of diseases classified elsewhere: Secondary | ICD-10-CM | POA: Diagnosis not present

## 2017-05-26 DIAGNOSIS — I5032 Chronic diastolic (congestive) heart failure: Secondary | ICD-10-CM | POA: Diagnosis not present

## 2017-05-26 DIAGNOSIS — K580 Irritable bowel syndrome with diarrhea: Secondary | ICD-10-CM | POA: Diagnosis not present

## 2017-05-26 DIAGNOSIS — N390 Urinary tract infection, site not specified: Secondary | ICD-10-CM | POA: Diagnosis not present

## 2017-05-26 DIAGNOSIS — J441 Chronic obstructive pulmonary disease with (acute) exacerbation: Secondary | ICD-10-CM | POA: Diagnosis not present

## 2017-05-26 DIAGNOSIS — E119 Type 2 diabetes mellitus without complications: Secondary | ICD-10-CM | POA: Diagnosis not present

## 2017-06-02 ENCOUNTER — Encounter: Payer: Self-pay | Admitting: Physician Assistant

## 2017-06-02 ENCOUNTER — Ambulatory Visit (INDEPENDENT_AMBULATORY_CARE_PROVIDER_SITE_OTHER): Payer: Medicare Other | Admitting: Physician Assistant

## 2017-06-02 VITALS — BP 90/60 | HR 82 | Ht 71.0 in | Wt 186.4 lb

## 2017-06-02 DIAGNOSIS — I5032 Chronic diastolic (congestive) heart failure: Secondary | ICD-10-CM

## 2017-06-02 DIAGNOSIS — E119 Type 2 diabetes mellitus without complications: Secondary | ICD-10-CM

## 2017-06-02 DIAGNOSIS — R748 Abnormal levels of other serum enzymes: Secondary | ICD-10-CM

## 2017-06-02 DIAGNOSIS — I2510 Atherosclerotic heart disease of native coronary artery without angina pectoris: Secondary | ICD-10-CM | POA: Diagnosis not present

## 2017-06-02 DIAGNOSIS — R7989 Other specified abnormal findings of blood chemistry: Principal | ICD-10-CM

## 2017-06-02 DIAGNOSIS — R778 Other specified abnormalities of plasma proteins: Secondary | ICD-10-CM
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
## 2017-06-02 NOTE — Patient Instructions (Signed)
Medication Instructions:  Your physician recommends that you continue on your current medications as directed. Please refer to the Current Medication list given to you today.   Labwork: None ordered  Testing/Procedures: Your physician has requested that you have a lexiscan myoview in 1 month in [place]. For further information please visit [email or web address]. Please follow instruction sheet, as given.    Follow-Up: Your physician wants you to follow-up in [place] with Benjamin [name] after lexiscan  Any Other Special Instructions Will Be Listed Below (If Applicable).     If you need a refill on your cardiac medications before your next appointment, please call your pharmacy.

## 2017-06-09 DIAGNOSIS — K269 Duodenal ulcer, unspecified as acute or chronic, without hemorrhage or perforation: Secondary | ICD-10-CM | POA: Diagnosis not present

## 2017-06-09 DIAGNOSIS — K580 Irritable bowel syndrome with diarrhea: Secondary | ICD-10-CM | POA: Diagnosis not present

## 2017-06-09 DIAGNOSIS — K209 Esophagitis, unspecified: Secondary | ICD-10-CM | POA: Diagnosis not present

## 2017-06-09 DIAGNOSIS — R109 Unspecified abdominal pain: Secondary | ICD-10-CM | POA: Diagnosis not present

## 2017-06-13 ENCOUNTER — Other Ambulatory Visit: Payer: Self-pay | Admitting: Family Medicine

## 2017-06-13 ENCOUNTER — Ambulatory Visit (INDEPENDENT_AMBULATORY_CARE_PROVIDER_SITE_OTHER): Payer: Medicare Other

## 2017-06-13 DIAGNOSIS — Z23 Encounter for immunization: Secondary | ICD-10-CM | POA: Diagnosis not present

## 2017-06-13 NOTE — Telephone Encounter (Signed)
Medication refilled per protocol. 

## 2017-06-17 ENCOUNTER — Encounter: Payer: Self-pay | Admitting: Pulmonary Disease

## 2017-06-17 ENCOUNTER — Ambulatory Visit (INDEPENDENT_AMBULATORY_CARE_PROVIDER_SITE_OTHER): Payer: Medicare Other | Admitting: Pulmonary Disease

## 2017-06-17 DIAGNOSIS — I5032 Chronic diastolic (congestive) heart failure: Secondary | ICD-10-CM

## 2017-06-17 DIAGNOSIS — Z23 Encounter for immunization: Secondary | ICD-10-CM | POA: Diagnosis not present

## 2017-06-17 NOTE — Patient Instructions (Signed)
I'm glad that her breathing has improved after your discharge from the hospital Yoiu have indicated that you don't want lung function test or trial of inhalers You can follow up with [place] as needed. Please give [place] a call if there is any change in your symptoms.
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
## 2017-06-30 ENCOUNTER — Other Ambulatory Visit: Payer: Self-pay

## 2017-06-30 DIAGNOSIS — R778 Other specified abnormalities of plasma proteins: Secondary | ICD-10-CM

## 2017-06-30 DIAGNOSIS — R7989 Other specified abnormal findings of blood chemistry: Principal | ICD-10-CM

## 2017-06-30 DIAGNOSIS — I2510 Atherosclerotic heart disease of native coronary artery without angina pectoris: Secondary | ICD-10-CM

## 2017-07-02 ENCOUNTER — Ambulatory Visit (HOSPITAL_COMMUNITY)
Admission: RE | Admit: 2017-07-02 | Discharge: 2017-07-02 | Disposition: A | Discharge status: Home / Self Care | Payer: Medicare Other | Source: Ambulatory Visit | Attending: Physician Assistant | Admitting: Physician Assistant

## 2017-07-02 ENCOUNTER — Encounter (HOSPITAL_COMMUNITY): Payer: Medicare Other

## 2017-07-02 DIAGNOSIS — R7989 Other specified abnormal findings of blood chemistry: Principal | ICD-10-CM

## 2017-07-02 DIAGNOSIS — I2510 Atherosclerotic heart disease of native coronary artery without angina pectoris: Secondary | ICD-10-CM

## 2017-07-02 DIAGNOSIS — R778 Other specified abnormalities of plasma proteins: Secondary | ICD-10-CM

## 2017-07-09 ENCOUNTER — Ambulatory Visit: Payer: Self-pay | Admitting: Physician Assistant

## 2017-08-22 ENCOUNTER — Ambulatory Visit: Payer: Medicare Other | Admitting: Family Medicine

## 2017-08-22 ENCOUNTER — Encounter: Payer: Self-pay | Admitting: Family Medicine

## 2017-09-11 ENCOUNTER — Other Ambulatory Visit: Payer: Self-pay | Admitting: Family Medicine

## 2017-09-15 ENCOUNTER — Ambulatory Visit: Payer: Medicare Other | Admitting: Family Medicine

## 2017-10-10 DIAGNOSIS — K209 Esophagitis, unspecified: Secondary | ICD-10-CM | POA: Diagnosis not present

## 2017-10-10 DIAGNOSIS — K580 Irritable bowel syndrome with diarrhea: Secondary | ICD-10-CM | POA: Diagnosis not present

## 2017-10-10 DIAGNOSIS — Z86010 Personal history of colonic polyps: Secondary | ICD-10-CM | POA: Diagnosis not present

## 2017-10-10 DIAGNOSIS — Z8719 Personal history of other diseases of the digestive system: Secondary | ICD-10-CM | POA: Diagnosis not present

## 2017-12-12 ENCOUNTER — Other Ambulatory Visit: Payer: Self-pay | Admitting: Family Medicine

## 2018-01-14 ENCOUNTER — Emergency Department (HOSPITAL_COMMUNITY): Payer: Medicare Other

## 2018-01-14 ENCOUNTER — Emergency Department (HOSPITAL_COMMUNITY)
Admission: EM | Admit: 2018-01-14 | Discharge: 2018-01-14 | Disposition: A | Discharge status: Home / Self Care | Payer: Medicare Other | Attending: Emergency Medicine | Admitting: Emergency Medicine

## 2018-01-14 ENCOUNTER — Encounter: Payer: Self-pay | Admitting: Family Medicine

## 2018-01-14 ENCOUNTER — Encounter (HOSPITAL_COMMUNITY): Payer: Self-pay | Admitting: Emergency Medicine

## 2018-01-14 ENCOUNTER — Ambulatory Visit (INDEPENDENT_AMBULATORY_CARE_PROVIDER_SITE_OTHER): Payer: Medicare Other | Admitting: Family Medicine

## 2018-01-14 ENCOUNTER — Other Ambulatory Visit: Payer: Self-pay

## 2018-01-14 VITALS — BP 118/82 | HR 115 | Temp 97.6°F | Resp 17 | Ht 71.0 in | Wt 185.0 lb

## 2018-01-14 DIAGNOSIS — K5792 Diverticulitis of intestine, part unspecified, without perforation or abscess without bleeding: Secondary | ICD-10-CM | POA: Diagnosis not present

## 2018-01-14 DIAGNOSIS — J449 Chronic obstructive pulmonary disease, unspecified: Secondary | ICD-10-CM | POA: Diagnosis not present

## 2018-01-14 DIAGNOSIS — R339 Retention of urine, unspecified: Secondary | ICD-10-CM

## 2018-01-14 DIAGNOSIS — E119 Type 2 diabetes mellitus without complications: Secondary | ICD-10-CM | POA: Diagnosis not present

## 2018-01-14 DIAGNOSIS — R1030 Lower abdominal pain, unspecified: Secondary | ICD-10-CM | POA: Diagnosis present

## 2018-01-14 DIAGNOSIS — Z87891 Personal history of nicotine dependence: Secondary | ICD-10-CM | POA: Diagnosis not present

## 2018-01-14 DIAGNOSIS — I5032 Chronic diastolic (congestive) heart failure: Secondary | ICD-10-CM | POA: Insufficient documentation

## 2018-01-14 DIAGNOSIS — I110 Hypertensive heart disease with heart failure: Secondary | ICD-10-CM | POA: Diagnosis not present

## 2018-01-14 DIAGNOSIS — K4090 Unilateral inguinal hernia, without obstruction or gangrene, not specified as recurrent: Secondary | ICD-10-CM | POA: Diagnosis not present

## 2018-01-14 DIAGNOSIS — Z79899 Other long term (current) drug therapy: Secondary | ICD-10-CM | POA: Diagnosis not present

## 2018-01-14 DIAGNOSIS — R39198 Other difficulties with micturition: Secondary | ICD-10-CM | POA: Diagnosis not present

## 2018-01-14 LAB — COMPREHENSIVE METABOLIC PANEL
ALT: 17 U/L (ref 17–63)
AST: 18 U/L (ref 15–41)
Albumin: 3.9 g/dL (ref 3.5–5.0)
Alkaline Phosphatase: 54 U/L (ref 38–126)
Anion gap: 12 (ref 5–15)
BUN: 20 mg/dL (ref 6–20)
CO2: 20 mmol/L — ABNORMAL LOW (ref 22–32)
Calcium: 9.1 mg/dL (ref 8.9–10.3)
Chloride: 107 mmol/L (ref 101–111)
Creatinine, Ser: 0.91 mg/dL (ref 0.61–1.24)
GFR calc Af Amer: >60 mL/min (ref >60)
GFR calc non Af Amer: >60 mL/min (ref >60)
Glucose, Bld: 113 mg/dL — ABNORMAL HIGH (ref 65–99)
Potassium: 3.9 mmol/L (ref 3.5–5.1)
Sodium: 139 mmol/L (ref 135–145)
Total Bilirubin: 1.3 mg/dL — ABNORMAL HIGH (ref 0.3–1.2)
Total Protein: 7.1 g/dL (ref 6.5–8.1)

## 2018-01-14 LAB — CBC WITH DIFFERENTIAL/PLATELET
Basophils Absolute: 0 10*3/uL (ref 0.0–0.1)
Basophils Relative: 0 %
Eosinophils Absolute: 0.1 10*3/uL (ref 0.0–0.7)
Eosinophils Relative: 1 %
HCT: 45.2 % (ref 39.0–52.0)
Hemoglobin: 15.3 g/dL (ref 13.0–17.0)
Lymphocytes Relative: 15 %
Lymphs Abs: 1.4 10*3/uL (ref 0.7–4.0)
MCH: 31.5 pg (ref 26.0–34.0)
MCHC: 33.8 g/dL (ref 30.0–36.0)
MCV: 93 fL (ref 78.0–100.0)
Monocytes Absolute: 0.9 10*3/uL (ref 0.1–1.0)
Monocytes Relative: 9 %
Neutro Abs: 7 10*3/uL (ref 1.7–7.7)
Neutrophils Relative %: 75 %
Platelets: 249 10*3/uL (ref 150–400)
RBC: 4.86 MIL/uL (ref 4.22–5.81)
RDW: 13.6 % (ref 11.5–15.5)
WBC: 9.5 10*3/uL (ref 4.0–10.5)

## 2018-01-14 LAB — URINALYSIS, ROUTINE W REFLEX MICROSCOPIC
BILIRUBIN URINE: NEGATIVE
Bacteria, UA: NONE SEEN /HPF
Bacteria, UA: NONE SEEN
Bilirubin Urine: NEGATIVE
GLUCOSE, UA: NEGATIVE
Glucose, UA: NEGATIVE mg/dL
Ketones, ur: NEGATIVE
Ketones, ur: NEGATIVE mg/dL
LEUKOCYTES UA: NEGATIVE
Leukocytes, UA: NEGATIVE
Nitrite: NEGATIVE
Nitrite: NEGATIVE
PROTEIN: NEGATIVE
Protein, ur: NEGATIVE mg/dL
Specific Gravity, Urine: 1.02 (ref 1.001–1.03)
Specific Gravity, Urine: 1.025 (ref 1.005–1.030)
Squamous Epithelial / LPF: NONE SEEN /HPF (ref <=5)
WBC UA: NONE SEEN /HPF (ref 0–5)
pH: 5.5 (ref 5.0–8.0)
pH: 5 (ref 5.0–8.0)

## 2018-01-14 LAB — MICROSCOPIC MESSAGE

## 2018-01-14 MED ORDER — ACETAMINOPHEN 325 MG PO TABS
650.0000 mg | ORAL_TABLET | Freq: Once | ORAL | Status: AC
  Administered 2018-01-14: 650 mg via ORAL
  Filled 2018-01-14: qty 2

## 2018-01-14 MED ORDER — CIPROFLOXACIN HCL 250 MG PO TABS
500.0000 mg | ORAL_TABLET | Freq: Once | ORAL | Status: AC
  Administered 2018-01-14: 500 mg via ORAL
  Filled 2018-01-14: qty 2

## 2018-01-14 MED ORDER — METRONIDAZOLE 500 MG PO TABS: 500.0000 mg | ORAL_TABLET | Freq: Three times a day (TID) | ORAL | 0 refills | Status: DC

## 2018-01-14 MED ORDER — CIPROFLOXACIN HCL 500 MG PO TABS: 500.0000 mg | ORAL_TABLET | Freq: Two times a day (BID) | ORAL | 0 refills | Status: DC

## 2018-01-14 MED ORDER — METRONIDAZOLE 500 MG PO TABS
500.0000 mg | ORAL_TABLET | Freq: Once | ORAL | Status: AC
  Administered 2018-01-14: 500 mg via ORAL
  Filled 2018-01-14: qty 1

## 2018-01-14 NOTE — ED Triage Notes (Addendum)
Pt sent from pcp, [place]. Only dribbling urine x 1 week. Pt has mild sob but states this is his normal breathing. mild swelling noted to ankles however pt states this is his normal also and is not any worse. Pt c/o pain to suprapubic area. Pt abd mildy distended. Pt states he feels he needs to urinate but cant

## 2018-01-14 NOTE — ED Notes (Signed)
Pt only had approx 253ml into foley cath. Pt states he still feels he needs to urinate and still having some pain.

## 2018-01-14 NOTE — Progress Notes (Signed)
Patient ID: Benjamin [name], male    DOB: 10-12-1940, 77 y.o.   MRN: [id number]  PCP: Alycia Rossetti, MD  Chief Complaint  Patient presents with  . Urinary Tract Infection    Patient has c/o chills, lower abdominal bloating, urinary hesitancy. States thought had issues with constipation and tried enema.  Also states he has history of prostate issues.     Subjective:   Benjamin [name] is a 77 y.o. male, presents to clinic with CC of difficulty initiating urine stream, has repeated dribbling, urinary frequency and urgency with feeling of distended abdomen with generalized abdominal pain.  He reports drinking large amounts of coffee and liquid throughout the day but for the past 4 to 5 days has only been able to pee small amounts.  He thought that he might be constipated so he took an enema and was able to have multiple loose bowel movements and his symptoms did not improve.  His pain, bloating and urinary symptoms have gradually worsened, today he feels hot and cold chills, decreased appetite with nausea.    Last August he had similar symptoms and was unfortunately admitted to ICU with sepsis secondary to UTI.  He noted having aggressive fluid resuscitation followed by difficulty breathing from flash pulmonary edema, was put on CPAP and was admitted to the ICU.  He states that he is never had any indwelling catheter or had to go to urology after this admission.  Ever had any problems with his prostate causing urinary retention and he is currently on Flomax.     Patient Active Problem List   Diagnosis Date Noted  . Chronic diastolic CHF (congestive heart failure) ([place])   . Right lower quadrant abdominal pain   . Irritable bowel syndrome with diarrhea   . Bacteremia due to Escherichia coli   . Urine retention   . Controlled type 2 diabetes mellitus without complication ([place])   . Palliative care by specialist   . Elevated troponin   . Coronary artery calcification seen on CAT scan   . Sepsis  secondary to UTI ([place]) 05/06/2017  . Chronic bilateral thoracic back pain 02/12/2017  . GERD (gastroesophageal reflux disease) 11/27/2016  . Constipation 10/28/2016  . Erectile dysfunction 06/25/2016  . Chronic diarrhea 06/07/2016  . Duodenal ulcer 02/19/2016  . COPD (chronic obstructive pulmonary disease) ([place]) 10/20/2015  . DDD (degenerative disc disease), lumbar 01/11/2014  . Borderline diabetes 02/18/2013  . Hip pain 11/13/2012  . BPH (benign prostatic hyperplasia) 03/09/2012  . Cataract 03/09/2012     Prior to Admission medications   Medication Sig Start Date End Date Taking? Authorizing Provider  Cefixime (SUPRAX) 400 MG CAPS capsule Take 1 capsule (400 mg total) by mouth daily. 05/12/17  Yes Allie Bossier, MD  promethazine (PHENERGAN) 12.5 MG tablet Take 1 tablet (12.5 mg total) by mouth every 8 (eight) hours as needed for nausea or vomiting. 02/12/17  Yes [place], Modena Nunnery, MD  RABEprazole (ACIPHEX) 20 MG tablet Take 1 tablet (20 mg total) by mouth daily. 02/03/17  Yes Annitta Needs, NP  tamsulosin (FLOMAX) 0.4 MG CAPS capsule TAKE 1 CAPSULE(0.4 MG) BY MOUTH DAILY 12/12/17  Yes [place], Modena Nunnery, MD  albuterol (PROVENTIL) (2.5 MG/3ML) 0.083% nebulizer solution Take 3 mLs (2.5 mg total) by nebulization every 6 (six) hours as needed for wheezing or shortness of breath. Patient not taking: Reported on 01/14/2018 05/14/17   Alycia Rossetti, MD  Cranberry 1000 MG CAPS Take by mouth daily.  [provider]  HYDROcodone-acetaminophen (NORCO) 5-325 MG tablet Take 1 tablet by mouth every 6 (six) hours as needed for moderate pain. Patient not taking: Reported on 01/14/2018 02/12/17   Alycia Rossetti, MD  loperamide (IMODIUM) 2 MG capsule Take 1 capsule (2 mg total) by mouth as needed for diarrhea or loose stools. Patient not taking: Reported on 01/14/2018 05/11/17   Allie Bossier, MD  traMADol (ULTRAM) 50 MG tablet Take 50-100 mg by mouth every 6 (six) hours as needed for moderate  pain.    [provider]     Allergies  Allergen Reactions  . Arsenic     Severe swelling if patient comes in contact   . Contrast Media [Iodinated Diagnostic Agents]      Family History  Problem Relation Age of Onset  . Diabetes Mother   . COPD Mother   . Heart disease Mother   . Colon cancer Neg Hx      Social History   Socioeconomic History  . Marital status: Married    Spouse name: Not on file  . Number of children: Not on file  . Years of education: Not on file  . Highest education level: Not on file  Occupational History  . Occupation: retired  Scientific laboratory technician  . Financial resource strain: Not on file  . Food insecurity:    Worry: Not on file    Inability: Not on file  . Transportation needs:    Medical: Not on file    Non-medical: Not on file  Tobacco Use  . Smoking status: Former Smoker    Packs/day: 0.50    Years: 50.00    Pack years: 25.00    Types: Cigarettes    Last attempt to quit: 02/15/2015    Years since quitting: 2.9  . Smokeless tobacco: Never Used  . Tobacco comment: Quit x 1 year  Substance and Sexual Activity  . Alcohol use: Yes    Alcohol/week: 0.0 oz    Comment: seldom, social   . Drug use: No  . Sexual activity: Yes  Lifestyle  . Physical activity:    Days per week: Not on file    Minutes per session: Not on file  . Stress: Not on file  Relationships  . Social connections:    Talks on phone: Not on file    Gets together: Not on file    Attends religious service: Not on file    Active member of club or organization: Not on file    Attends meetings of clubs or organizations: Not on file    Relationship status: Not on file  . Intimate partner violence:    Fear of current or ex partner: Not on file    Emotionally abused: Not on file    Physically abused: Not on file    Forced sexual activity: Not on file  Other Topics Concern  . Not on file  Social History Narrative  . Not on file     Review of Systems    Constitutional: Positive for appetite change and chills. Negative for activity change, diaphoresis, fatigue, fever and unexpected weight change.  HENT: Negative.   Eyes: Negative.   Respiratory: Negative.   Cardiovascular: Negative.   Gastrointestinal: Positive for abdominal distention, abdominal pain and nausea. Negative for anal bleeding, blood in stool, constipation, diarrhea, rectal pain and vomiting.  Endocrine: Negative.   Genitourinary: Positive for decreased urine volume, difficulty urinating, frequency and urgency. Negative for discharge, enuresis, flank pain, genital  sores, hematuria, penile pain, penile swelling, scrotal swelling and testicular pain.  Musculoskeletal: Negative.   Skin: Negative.   Allergic/Immunologic: Negative.   Neurological: Negative.  Negative for dizziness, tremors, syncope, weakness, light-headedness and headaches.  Psychiatric/Behavioral: Negative.   All other systems reviewed and are negative.      Objective:    Vitals:   01/14/18 1614  BP: 118/82  Pulse: (!) 115  Resp: 17  Temp: 97.6 F (36.4 C)  TempSrc: Oral  SpO2: 95%  Weight: 185 lb (83.9 kg)  Height: 5\' 11"  (1.803 m)      Physical Exam  Constitutional: He is oriented to person, place, and time. He appears well-developed and well-nourished.  Non-toxic appearance. He does not appear ill. No distress.  Elderly male, appears stated age, appears very uncomfortable, nontoxic-appearing, ill-appearing  HENT:  Head: Normocephalic and atraumatic.  Right Ear: Tympanic membrane, external ear and ear canal normal.  Left Ear: Tympanic membrane, external ear and ear canal normal.  Nose: No mucosal edema or rhinorrhea. Right sinus exhibits no maxillary sinus tenderness and no frontal sinus tenderness. Left sinus exhibits no maxillary sinus tenderness and no frontal sinus tenderness.  Mouth/Throat: Uvula is midline and oropharynx is clear and moist. No trismus in the jaw. No uvula swelling. No  oropharyngeal exudate, posterior oropharyngeal edema or posterior oropharyngeal erythema.  Eyes: Pupils are equal, round, and reactive to light. Conjunctivae, EOM and lids are normal.  Neck: Trachea normal, normal range of motion and phonation normal. Neck supple. No tracheal deviation present.  Cardiovascular: Normal rate, regular rhythm, normal heart sounds and normal pulses. Exam reveals no gallop and no friction rub.  No murmur heard. Pulses:      Radial pulses are 2+ on the right side, and 2+ on the left side.       Posterior tibial pulses are 2+ on the right side, and 2+ on the left side.  Pulmonary/Chest: Effort normal and breath sounds normal. He has no wheezes. He has no rhonchi. He has no rales.  Abdominal: Normal appearance and bowel sounds are normal. He exhibits distension. There is no tenderness. There is no rebound and no guarding.  Protuberant abdomen, moderately distended, normalized tender to palpation with voluntary guarding, no CVA tenderness  Musculoskeletal: Normal range of motion. He exhibits no edema.  Neurological: He is alert and oriented to person, place, and time. Gait normal.  Skin: Skin is warm, dry and intact. Capillary refill takes less than 2 seconds. No rash noted. He is not diaphoretic. There is pallor.  Psychiatric: He has a normal mood and affect. His speech is normal and behavior is normal.  Nursing note and vitals reviewed.         Assessment & Plan:      ICD-10-CM   1. Difficulty in urination R39.198 Urinalysis, Routine w reflex microscopic    Urine Culture  2. Urinary retention R33.9     Sent to the ER for bladder scan, Foley for bladder decompression, labs.  Is mildly tachycardic here in clinic, does not appear toxic, is able to walk around but he does appear very uncomfortable.  Afebrile.  Feel tachycardia likely secondary to severe pain.    ddx urinary retention, prostatitis, UTI, or other abdominal pathology  Delsa Grana, PA-C 01/14/18  4:54 PM

## 2018-01-14 NOTE — Patient Instructions (Addendum)
Referred to ER for bladder scan, foley.  Work up for UTI/prostatitis/abdominal pain, likely secondary to [facility] hx.   Dribbling, hot/cold chills, decreased appetite.       Acute Urinary Retention, Male Acute urinary retention is when you are unable to pee (urinate). Acute urinary retention is common in older men. Prostates can get bigger, which blocks the flow of pee. Follow these instructions at home:  Drink enough fluids to keep your pee clear or pale yellow.  If you are sent home with a tube that drains the bladder (catheter), there will be a drainage bag attached to it. There are two types of bags. One is big that you can wear at night without having to empty it. One is smaller and needs to be emptied more often. ? Keep the drainage bag empty. ? Keep the drainage bag lower than your catheter.  Only take medicine as told by your doctor. Contact a doctor if:  You have a low-grade fever.  You have spasms or you are leaking pee when you have spasms. Get help right away if:  You have chills or a fever.  Your catheter stops draining pee.  Your catheter falls out.  You have increased bleeding that does not stop after you have rested and increased the amount of fluids you had been drinking. This information is not intended to replace advice given to you by your health care provider. Make sure you discuss any questions you have with your health care provider. Document Released: 02/19/2008 Document Revised: 02/08/2016 Document Reviewed: 02/11/2013 Elsevier Interactive Patient Education  2017 [employer].

## 2018-01-14 NOTE — ED Triage Notes (Signed)
Per PA at [place] pt has had urinary retention, urinary frequency, abd distention, pain, and chills.  Reports pt had similar symptoms when he had a uti and went into flas pulmonary edema when he received IVF resuscitation.

## 2018-01-15 LAB — URINE CULTURE
MICRO NUMBER:: 90531643
SPECIMEN QUALITY:: ADEQUATE

## 2018-01-16 NOTE — Progress Notes (Signed)
Negative urine culture  - patient will be notified
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
## 2018-01-22 ENCOUNTER — Emergency Department (HOSPITAL_COMMUNITY)
Admission: EM | Admit: 2018-01-22 | Discharge: 2018-01-23 | Disposition: A | Discharge status: Home / Self Care | Payer: Medicare Other | Attending: Emergency Medicine | Admitting: Emergency Medicine

## 2018-01-22 ENCOUNTER — Encounter (HOSPITAL_COMMUNITY): Payer: Self-pay | Admitting: *Deleted

## 2018-01-22 ENCOUNTER — Emergency Department (HOSPITAL_COMMUNITY): Payer: Medicare Other

## 2018-01-22 ENCOUNTER — Other Ambulatory Visit: Payer: Self-pay

## 2018-01-22 DIAGNOSIS — G8929 Other chronic pain: Secondary | ICD-10-CM | POA: Insufficient documentation

## 2018-01-22 DIAGNOSIS — I5032 Chronic diastolic (congestive) heart failure: Secondary | ICD-10-CM | POA: Diagnosis not present

## 2018-01-22 DIAGNOSIS — R197 Diarrhea, unspecified: Secondary | ICD-10-CM

## 2018-01-22 DIAGNOSIS — J449 Chronic obstructive pulmonary disease, unspecified: Secondary | ICD-10-CM | POA: Insufficient documentation

## 2018-01-22 DIAGNOSIS — R6883 Chills (without fever): Secondary | ICD-10-CM | POA: Diagnosis not present

## 2018-01-22 DIAGNOSIS — R109 Unspecified abdominal pain: Secondary | ICD-10-CM | POA: Insufficient documentation

## 2018-01-22 DIAGNOSIS — Z79899 Other long term (current) drug therapy: Secondary | ICD-10-CM | POA: Insufficient documentation

## 2018-01-22 DIAGNOSIS — K529 Noninfective gastroenteritis and colitis, unspecified: Secondary | ICD-10-CM

## 2018-01-22 DIAGNOSIS — E119 Type 2 diabetes mellitus without complications: Secondary | ICD-10-CM | POA: Diagnosis not present

## 2018-01-22 DIAGNOSIS — Z87891 Personal history of nicotine dependence: Secondary | ICD-10-CM | POA: Insufficient documentation

## 2018-01-22 DIAGNOSIS — R110 Nausea: Secondary | ICD-10-CM | POA: Diagnosis not present

## 2018-01-22 DIAGNOSIS — K5730 Diverticulosis of large intestine without perforation or abscess without bleeding: Secondary | ICD-10-CM | POA: Diagnosis not present

## 2018-01-22 DIAGNOSIS — I110 Hypertensive heart disease with heart failure: Secondary | ICD-10-CM | POA: Insufficient documentation

## 2018-01-22 LAB — COMPREHENSIVE METABOLIC PANEL
ALBUMIN: 4 g/dL (ref 3.5–5.0)
ALK PHOS: 49 U/L (ref 38–126)
ALT: 19 U/L (ref 17–63)
ANION GAP: 7 (ref 5–15)
AST: 25 U/L (ref 15–41)
BILIRUBIN TOTAL: 0.7 mg/dL (ref 0.3–1.2)
BUN: 13 mg/dL (ref 6–20)
CALCIUM: 8.6 mg/dL — AB (ref 8.9–10.3)
CO2: 23 mmol/L (ref 22–32)
Chloride: 110 mmol/L (ref 101–111)
Creatinine, Ser: 1.03 mg/dL (ref 0.61–1.24)
GFR calc Af Amer: >60 mL/min (ref >60)
GFR calc non Af Amer: >60 mL/min (ref >60)
GLUCOSE: 119 mg/dL — AB (ref 65–99)
Potassium: 3.5 mmol/L (ref 3.5–5.1)
Sodium: 140 mmol/L (ref 135–145)
Total Protein: 6.9 g/dL (ref 6.5–8.1)

## 2018-01-22 LAB — CBC WITH DIFFERENTIAL/PLATELET
BASOS ABS: 0 10*3/uL (ref 0.0–0.1)
Basophils Relative: 1 %
Eosinophils Absolute: 0.3 10*3/uL (ref 0.0–0.7)
Eosinophils Relative: 5 %
HEMATOCRIT: 47 % (ref 39.0–52.0)
Hemoglobin: 15.8 g/dL (ref 13.0–17.0)
LYMPHS PCT: 26 %
Lymphs Abs: 1.8 10*3/uL (ref 0.7–4.0)
MCH: 31.2 pg (ref 26.0–34.0)
MCHC: 33.6 g/dL (ref 30.0–36.0)
MCV: 92.9 fL (ref 78.0–100.0)
Monocytes Absolute: 0.6 10*3/uL (ref 0.1–1.0)
Monocytes Relative: 9 %
NEUTROS ABS: 4.1 10*3/uL (ref 1.7–7.7)
Neutrophils Relative %: 59 %
Platelets: 325 10*3/uL (ref 150–400)
RBC: 5.06 MIL/uL (ref 4.22–5.81)
RDW: 13.7 % (ref 11.5–15.5)
WBC: 6.9 10*3/uL (ref 4.0–10.5)

## 2018-01-22 LAB — URINALYSIS, ROUTINE W REFLEX MICROSCOPIC
BACTERIA UA: NONE SEEN
Bilirubin Urine: NEGATIVE
Glucose, UA: NEGATIVE mg/dL
Ketones, ur: NEGATIVE mg/dL
Leukocytes, UA: NEGATIVE
Nitrite: NEGATIVE
PROTEIN: NEGATIVE mg/dL
Specific Gravity, Urine: 1.025 (ref 1.005–1.030)
pH: 5 (ref 5.0–8.0)

## 2018-01-22 LAB — LIPASE, BLOOD: Lipase: 34 U/L (ref 11–51)

## 2018-01-22 MED ORDER — SODIUM CHLORIDE 0.9 % IV BOLUS
1000.0000 mL | Freq: Once | INTRAVENOUS | Status: AC
  Administered 2018-01-22: 1000 mL via INTRAVENOUS
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
## 2018-01-22 NOTE — ED Triage Notes (Signed)
Pt reports diarrhea x 4 days. Pt has been taking an antibiotic for diverticulitis and finished it today. Pt has been weak and is having chills.

## 2018-01-23 LAB — C DIFFICILE QUICK SCREEN W PCR REFLEX
C DIFFICILE (CDIFF) TOXIN: NEGATIVE
C DIFFICLE (CDIFF) ANTIGEN: NEGATIVE
C Diff interpretation: NOT DETECTED

## 2018-01-23 MED ORDER — ONDANSETRON 4 MG PO TBDP: 4.0000 mg | ORAL_TABLET | Freq: Three times a day (TID) | ORAL | 0 refills | Status: DC | PRN

## 2018-01-23 NOTE — Discharge Instructions (Addendum)
Keep yourself hydrated.  Stool samples have have been sent and can be followed by her primary care doctor.  Return for worsening abdominal symptoms.

## 2018-01-24 LAB — GASTROINTESTINAL PANEL BY PCR, STOOL (REPLACES STOOL CULTURE)

## 2018-03-24 ENCOUNTER — Other Ambulatory Visit: Payer: Self-pay

## 2018-03-24 ENCOUNTER — Other Ambulatory Visit: Payer: Self-pay | Admitting: Family Medicine

## 2018-03-24 ENCOUNTER — Emergency Department (HOSPITAL_COMMUNITY)
Admission: EM | Admit: 2018-03-24 | Discharge: 2018-03-24 | Disposition: A | Discharge status: Home / Self Care | Payer: Medicare Other | Attending: Emergency Medicine | Admitting: Emergency Medicine

## 2018-03-24 ENCOUNTER — Encounter (HOSPITAL_COMMUNITY): Payer: Self-pay

## 2018-03-24 DIAGNOSIS — Z87891 Personal history of nicotine dependence: Secondary | ICD-10-CM | POA: Diagnosis not present

## 2018-03-24 DIAGNOSIS — R0789 Other chest pain: Secondary | ICD-10-CM | POA: Diagnosis not present

## 2018-03-24 DIAGNOSIS — J449 Chronic obstructive pulmonary disease, unspecified: Secondary | ICD-10-CM | POA: Diagnosis not present

## 2018-03-24 DIAGNOSIS — K219 Gastro-esophageal reflux disease without esophagitis: Secondary | ICD-10-CM | POA: Diagnosis not present

## 2018-03-24 DIAGNOSIS — K449 Diaphragmatic hernia without obstruction or gangrene: Secondary | ICD-10-CM | POA: Diagnosis not present

## 2018-03-24 DIAGNOSIS — I5032 Chronic diastolic (congestive) heart failure: Secondary | ICD-10-CM | POA: Insufficient documentation

## 2018-03-24 DIAGNOSIS — E119 Type 2 diabetes mellitus without complications: Secondary | ICD-10-CM | POA: Insufficient documentation

## 2018-03-24 DIAGNOSIS — R1013 Epigastric pain: Secondary | ICD-10-CM | POA: Diagnosis present

## 2018-03-24 DIAGNOSIS — Z79899 Other long term (current) drug therapy: Secondary | ICD-10-CM | POA: Insufficient documentation

## 2018-03-24 LAB — COMPREHENSIVE METABOLIC PANEL
ALBUMIN: 3.8 g/dL (ref 3.5–5.0)
ALK PHOS: 55 U/L (ref 38–126)
ALT: 18 U/L (ref 0–44)
ANION GAP: 7 (ref 5–15)
AST: 18 U/L (ref 15–41)
BILIRUBIN TOTAL: 1 mg/dL (ref 0.3–1.2)
BUN: 19 mg/dL (ref 8–23)
CALCIUM: 8.8 mg/dL — AB (ref 8.9–10.3)
CO2: 23 mmol/L (ref 22–32)
Chloride: 109 mmol/L (ref 98–111)
Creatinine, Ser: 1.02 mg/dL (ref 0.61–1.24)
GFR calc non Af Amer: >60 mL/min (ref >60)
GLUCOSE: 126 mg/dL — AB (ref 70–99)
POTASSIUM: 4.3 mmol/L (ref 3.5–5.1)
Sodium: 139 mmol/L (ref 135–145)
TOTAL PROTEIN: 6.6 g/dL (ref 6.5–8.1)

## 2018-03-24 LAB — CBC
HEMATOCRIT: 46.2 % (ref 39.0–52.0)
HEMOGLOBIN: 15.6 g/dL (ref 13.0–17.0)
MCH: 31.6 pg (ref 26.0–34.0)
MCHC: 33.8 g/dL (ref 30.0–36.0)
MCV: 93.7 fL (ref 78.0–100.0)
Platelets: 270 10*3/uL (ref 150–400)
RBC: 4.93 MIL/uL (ref 4.22–5.81)
RDW: 13.7 % (ref 11.5–15.5)
WBC: 9 10*3/uL (ref 4.0–10.5)

## 2018-03-24 LAB — LIPASE, BLOOD: Lipase: 32 U/L (ref 11–51)

## 2018-03-24 LAB — TROPONIN I

## 2018-03-24 MED ORDER — PANTOPRAZOLE SODIUM 20 MG PO TBEC: DELAYED_RELEASE_TABLET | ORAL | 0 refills | Status: DC

## 2018-03-24 MED ORDER — FAMOTIDINE 20 MG PO TABS
20.0000 mg | ORAL_TABLET | Freq: Once | ORAL | Status: AC
  Administered 2018-03-24: 20 mg via ORAL
  Filled 2018-03-24: qty 1

## 2018-03-24 MED ORDER — GI COCKTAIL ~~LOC~~
30.0000 mL | Freq: Once | ORAL | Status: AC
  Administered 2018-03-24: 30 mL via ORAL
  Filled 2018-03-24: qty 30
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
## 2018-03-24 NOTE — Discharge Instructions (Addendum)
Start protonix OTC twice a day for 1-2 weeks then once a day. Keep your appointment with Dr Alessandra Bevels on the 17th and let him know about your ED visit. Return to the ED if you get worse.

## 2018-03-24 NOTE — ED Triage Notes (Signed)
Pt reports epigastric/abdominal pain described as a "knot" with a lot of belching for 2 days. Pt had recently stopped taking rabeprazole 20 mg several days ago b/c insurance would no longer cover this. Pt reports this medication controlled this "knot feeling".

## 2018-04-01 DIAGNOSIS — K219 Gastro-esophageal reflux disease without esophagitis: Secondary | ICD-10-CM | POA: Diagnosis not present

## 2018-04-01 DIAGNOSIS — K580 Irritable bowel syndrome with diarrhea: Secondary | ICD-10-CM | POA: Diagnosis not present

## 2018-04-01 DIAGNOSIS — K209 Esophagitis, unspecified: Secondary | ICD-10-CM | POA: Diagnosis not present

## 2018-04-01 DIAGNOSIS — Z8719 Personal history of other diseases of the digestive system: Secondary | ICD-10-CM | POA: Diagnosis not present

## 2018-04-01 DIAGNOSIS — Z86010 Personal history of colonic polyps: Secondary | ICD-10-CM | POA: Diagnosis not present

## 2018-04-10 ENCOUNTER — Other Ambulatory Visit: Payer: Self-pay

## 2018-04-10 ENCOUNTER — Ambulatory Visit (INDEPENDENT_AMBULATORY_CARE_PROVIDER_SITE_OTHER): Payer: Medicare Other | Admitting: Family Medicine

## 2018-04-10 ENCOUNTER — Encounter: Payer: Self-pay | Admitting: Family Medicine

## 2018-04-10 VITALS — BP 116/82 | HR 82 | Temp 98.9°F | Resp 14 | Ht 71.0 in | Wt 194.0 lb

## 2018-04-10 DIAGNOSIS — E7439 Other disorders of intestinal carbohydrate absorption: Secondary | ICD-10-CM

## 2018-04-10 DIAGNOSIS — R7309 Other abnormal glucose: Secondary | ICD-10-CM | POA: Diagnosis not present

## 2018-04-10 DIAGNOSIS — M654 Radial styloid tenosynovitis [de Quervain]: Secondary | ICD-10-CM | POA: Diagnosis not present

## 2018-04-10 DIAGNOSIS — M79641 Pain in right hand: Secondary | ICD-10-CM

## 2018-04-10 NOTE — Patient Instructions (Signed)
Use topical gel  Use brace Ultram as needed F/U Dec/Jan for Physical

## 2018-04-10 NOTE — Progress Notes (Signed)
   Subjective:    Patient ID: Benjamin [name], male    DOB: [birth year], 77 y.o.   MRN: [id number]  Patient presents for Follow-up (is not fasting) and Hand Pain (R sided thumb pain radiating down to fingers)   Pt here to f/u chronic medical problems   Medications reviewed  He has been in the ER three times over past couple of months for diarrhea/ hiatal hernia He has seen GI multiple times for this  Recent labs reviewed    [facility] - Monday - EGD/Colonoscopy    No injury to right and/thumb - intermittant pain along thurmb down to wrist for months,  No specific, no ijury , history of left wrist surgery   Right handed, using mouse a lot  Taking tylenol reglary For pain   Has ultram and norco but has not taken   A1C 6% in December due to recheck             Review Of Systems:  GEN- denies fatigue, fever, weight loss,weakness, recent illness HEENT- denies eye drainage, change in vision, nasal discharge, CVS- denies chest pain, palpitations RESP- denies SOB, cough, wheeze ABD- denies N/V, change in stools, abd pain GU- denies dysuria, hematuria, dribbling, incontinence MSK- + joint pain, muscle aches, injury Neuro- denies headache, dizziness, syncope, seizure activity       Objective:    BP 116/82   Pulse 82   Temp 98.9 F (37.2 C) (Oral)   Resp 14   Ht 5\' 11"  (1.803 m)   Wt 194 lb (88 kg)   SpO2 96%   BMI 27.06 kg/m  GEN- NAD, alert and oriented x3 HEENT- PERRL, EOMI, non injected sclera, pink conjunctiva, MMM, oropharynx clear CVS- RRR, no murmur RESP-CTAB MSK- right thumb, normal appearing, TTP along thumb mid to wrist, + finkelsteins, pain with flexion of wrist and palpation over wrist, good ROM hand/wrist, FROM elbows, able to make loose fist with thumb EXT- No edema Pulses- Radial, DP- 2+        Assessment & Plan:      Problem List Items Addressed This Visit      Unprioritized   Glucose intolerance - Primary    Recheck A1C, no current meds      Relevant Orders   Hemoglobin A1c (Completed)    Other Visit Diagnoses    De Quervain's tenosynovitis       Due to GI procedure, no NSAIDS/Steroids, can use topical voltaren, ultram, brace. Hand specialist if not improving   Pain of right hand          Note: This dictation was prepared with Dragon dictation along with smaller phrase technology. Any transcriptional errors that result from this process are unintentional.

## 2018-04-11 LAB — HEMOGLOBIN A1C
HEMOGLOBIN A1C: 6.1 %{Hb} — AB (ref <5.7)
Mean Plasma Glucose: 128 (calc)
eAG (mmol/L): 7.1 (calc)

## 2018-04-13 ENCOUNTER — Encounter: Payer: Self-pay | Admitting: Family Medicine

## 2018-04-13 DIAGNOSIS — K635 Polyp of colon: Secondary | ICD-10-CM | POA: Diagnosis not present

## 2018-04-13 DIAGNOSIS — Z86010 Personal history of colonic polyps: Secondary | ICD-10-CM | POA: Diagnosis not present

## 2018-04-13 DIAGNOSIS — K2930 Chronic superficial gastritis without bleeding: Secondary | ICD-10-CM | POA: Diagnosis not present

## 2018-04-13 DIAGNOSIS — K209 Esophagitis, unspecified: Secondary | ICD-10-CM | POA: Diagnosis not present

## 2018-04-13 DIAGNOSIS — K269 Duodenal ulcer, unspecified as acute or chronic, without hemorrhage or perforation: Secondary | ICD-10-CM | POA: Diagnosis not present

## 2018-04-13 DIAGNOSIS — K648 Other hemorrhoids: Secondary | ICD-10-CM | POA: Diagnosis not present

## 2018-04-13 DIAGNOSIS — K5730 Diverticulosis of large intestine without perforation or abscess without bleeding: Secondary | ICD-10-CM | POA: Diagnosis not present

## 2018-04-13 NOTE — Assessment & Plan Note (Signed)
Recheck A1C, no current meds

## 2018-04-16 DIAGNOSIS — K635 Polyp of colon: Secondary | ICD-10-CM | POA: Diagnosis not present

## 2018-04-16 DIAGNOSIS — K2930 Chronic superficial gastritis without bleeding: Secondary | ICD-10-CM | POA: Diagnosis not present

## 2018-04-17 ENCOUNTER — Encounter: Payer: Self-pay | Admitting: *Deleted

## 2018-04-22 ENCOUNTER — Telehealth: Payer: Self-pay | Admitting: *Deleted

## 2018-04-22 NOTE — Telephone Encounter (Signed)
Received call from patient.   Reports that he received call from GI and was informed that he does not have ulcer. Requested MD to refill Naprosyn for hand pain.   MD made aware and advised that patient will need to inquire from GI when it will be safe to resume.   Call placed to patient. [place].

## 2018-04-23 NOTE — Telephone Encounter (Signed)
Call placed to patient. LMTRC.  

## 2018-04-24 NOTE — Telephone Encounter (Signed)
Call placed to patient and patient made aware.  

## 2018-04-30 ENCOUNTER — Telehealth: Payer: Self-pay | Admitting: Family Medicine

## 2018-04-30 DIAGNOSIS — M654 Radial styloid tenosynovitis [de Quervain]: Secondary | ICD-10-CM

## 2018-04-30 NOTE — Telephone Encounter (Signed)
Patient called in requesting a referral to a Hand Specialist for his hand pain. May we refer?

## 2018-05-01 DIAGNOSIS — M654 Radial styloid tenosynovitis [de Quervain]: Secondary | ICD-10-CM | POA: Diagnosis not present

## 2018-05-01 NOTE — Telephone Encounter (Signed)
Spoke with patient and informed him that we are referring. Patient verbalized understanding and stated that he saw Benjamin [name] today. They are just requesting office notes. (see referral)

## 2018-05-01 NOTE — Telephone Encounter (Signed)
Okay to refer to hand specialist Dx- [facility], see last note from July

## 2018-05-19 DIAGNOSIS — H5201 Hypermetropia, right eye: Secondary | ICD-10-CM | POA: Diagnosis not present

## 2018-05-19 DIAGNOSIS — H524 Presbyopia: Secondary | ICD-10-CM | POA: Diagnosis not present

## 2018-05-19 DIAGNOSIS — H4720 Unspecified optic atrophy: Secondary | ICD-10-CM | POA: Diagnosis not present

## 2018-05-19 DIAGNOSIS — H52221 Regular astigmatism, right eye: Secondary | ICD-10-CM | POA: Diagnosis not present

## 2018-05-19 LAB — HM DIABETES EYE EXAM

## 2018-06-23 ENCOUNTER — Other Ambulatory Visit: Payer: Self-pay | Admitting: Family Medicine

## 2018-06-29 DIAGNOSIS — M25511 Pain in right shoulder: Secondary | ICD-10-CM | POA: Diagnosis not present

## 2018-06-29 DIAGNOSIS — M542 Cervicalgia: Secondary | ICD-10-CM | POA: Diagnosis not present

## 2018-07-09 ENCOUNTER — Ambulatory Visit (INDEPENDENT_AMBULATORY_CARE_PROVIDER_SITE_OTHER): Payer: Medicare Other

## 2018-07-09 DIAGNOSIS — Z23 Encounter for immunization: Secondary | ICD-10-CM | POA: Diagnosis not present

## 2018-07-09 NOTE — Progress Notes (Signed)
Patient was in the office for his flu vaccine.Patient received the vaccine in his left deltoid patient toereated well

## 2018-08-17 DIAGNOSIS — H26491 Other secondary cataract, right eye: Secondary | ICD-10-CM | POA: Diagnosis not present

## 2018-08-17 DIAGNOSIS — Z961 Presence of intraocular lens: Secondary | ICD-10-CM | POA: Diagnosis not present

## 2018-08-17 DIAGNOSIS — H53022 Refractive amblyopia, left eye: Secondary | ICD-10-CM | POA: Diagnosis not present

## 2018-08-17 DIAGNOSIS — H5211 Myopia, right eye: Secondary | ICD-10-CM | POA: Diagnosis not present

## 2018-08-17 DIAGNOSIS — H2640 Unspecified secondary cataract: Secondary | ICD-10-CM | POA: Diagnosis not present

## 2018-08-24 DIAGNOSIS — M542 Cervicalgia: Secondary | ICD-10-CM | POA: Diagnosis not present

## 2018-08-24 DIAGNOSIS — M4722 Other spondylosis with radiculopathy, cervical region: Secondary | ICD-10-CM | POA: Diagnosis not present

## 2018-08-24 DIAGNOSIS — M25511 Pain in right shoulder: Secondary | ICD-10-CM | POA: Diagnosis not present

## 2018-08-25 ENCOUNTER — Other Ambulatory Visit (HOSPITAL_COMMUNITY): Payer: Self-pay | Admitting: Sports Medicine

## 2018-08-25 DIAGNOSIS — M5412 Radiculopathy, cervical region: Secondary | ICD-10-CM

## 2018-08-28 ENCOUNTER — Ambulatory Visit (HOSPITAL_COMMUNITY)
Admission: RE | Admit: 2018-08-28 | Discharge: 2018-08-28 | Disposition: A | Discharge status: Home / Self Care | Payer: Medicare Other | Source: Ambulatory Visit | Attending: Sports Medicine | Admitting: Sports Medicine

## 2018-08-28 DIAGNOSIS — M5412 Radiculopathy, cervical region: Secondary | ICD-10-CM | POA: Diagnosis present

## 2018-08-28 DIAGNOSIS — M5011 Cervical disc disorder with radiculopathy,  high cervical region: Secondary | ICD-10-CM | POA: Diagnosis not present

## 2018-08-28 DIAGNOSIS — M542 Cervicalgia: Secondary | ICD-10-CM | POA: Diagnosis not present

## 2018-09-02 DIAGNOSIS — M542 Cervicalgia: Secondary | ICD-10-CM | POA: Diagnosis not present

## 2018-09-02 DIAGNOSIS — M4722 Other spondylosis with radiculopathy, cervical region: Secondary | ICD-10-CM | POA: Diagnosis not present

## 2018-09-02 DIAGNOSIS — M25511 Pain in right shoulder: Secondary | ICD-10-CM | POA: Diagnosis not present

## 2018-09-02 DIAGNOSIS — M47812 Spondylosis without myelopathy or radiculopathy, cervical region: Secondary | ICD-10-CM | POA: Diagnosis not present

## 2018-09-11 DIAGNOSIS — M47812 Spondylosis without myelopathy or radiculopathy, cervical region: Secondary | ICD-10-CM | POA: Diagnosis not present

## 2018-09-11 DIAGNOSIS — M4722 Other spondylosis with radiculopathy, cervical region: Secondary | ICD-10-CM | POA: Diagnosis not present

## 2018-09-11 DIAGNOSIS — M542 Cervicalgia: Secondary | ICD-10-CM | POA: Diagnosis not present

## 2018-09-17 ENCOUNTER — Other Ambulatory Visit: Payer: Self-pay | Admitting: *Deleted

## 2018-09-17 DIAGNOSIS — Z13220 Encounter for screening for lipoid disorders: Secondary | ICD-10-CM

## 2018-09-17 DIAGNOSIS — Z136 Encounter for screening for cardiovascular disorders: Secondary | ICD-10-CM

## 2018-09-17 DIAGNOSIS — E7439 Other disorders of intestinal carbohydrate absorption: Secondary | ICD-10-CM

## 2018-09-17 DIAGNOSIS — E119 Type 2 diabetes mellitus without complications: Secondary | ICD-10-CM

## 2018-09-18 ENCOUNTER — Other Ambulatory Visit: Payer: Self-pay | Admitting: Family Medicine

## 2018-09-18 DIAGNOSIS — Z13220 Encounter for screening for lipoid disorders: Secondary | ICD-10-CM | POA: Diagnosis not present

## 2018-09-18 DIAGNOSIS — Z136 Encounter for screening for cardiovascular disorders: Secondary | ICD-10-CM | POA: Diagnosis not present

## 2018-09-18 DIAGNOSIS — E7439 Other disorders of intestinal carbohydrate absorption: Secondary | ICD-10-CM | POA: Diagnosis not present

## 2018-09-18 DIAGNOSIS — E119 Type 2 diabetes mellitus without complications: Secondary | ICD-10-CM | POA: Diagnosis not present

## 2018-09-19 LAB — CBC WITH DIFFERENTIAL/PLATELET
Absolute Monocytes: 616 cells/uL (ref 200–950)
Basophils Absolute: 53 cells/uL (ref 0–200)
Basophils Relative: 0.7 %
EOS ABS: 289 {cells}/uL (ref 15–500)
EOS PCT: 3.8 %
HCT: 47.1 % (ref 38.5–50.0)
Hemoglobin: 16.5 g/dL (ref 13.2–17.1)
Lymphs Abs: 1748 cells/uL (ref 850–3900)
MCH: 32.4 pg (ref 27.0–33.0)
MCHC: 35 g/dL (ref 32.0–36.0)
MCV: 92.5 fL (ref 80.0–100.0)
MONOS PCT: 8.1 %
MPV: 9.3 fL (ref 7.5–12.5)
Neutro Abs: 4894 cells/uL (ref 1500–7800)
Neutrophils Relative %: 64.4 %
PLATELETS: 296 10*3/uL (ref 140–400)
RBC: 5.09 10*6/uL (ref 4.20–5.80)
RDW: 13.1 % (ref 11.0–15.0)
Total Lymphocyte: 23 %
WBC: 7.6 10*3/uL (ref 3.8–10.8)

## 2018-09-19 LAB — HEMOGLOBIN A1C
Hgb A1c MFr Bld: 6.5 % of total Hgb — ABNORMAL HIGH (ref <5.7)
Mean Plasma Glucose: 140 (calc)
eAG (mmol/L): 7.7 (calc)

## 2018-09-19 LAB — COMPLETE METABOLIC PANEL WITH GFR
AG Ratio: 1.9 (calc) (ref 1.0–2.5)
ALT: 42 U/L (ref 9–46)
AST: 25 U/L (ref 10–35)
Albumin: 4 g/dL (ref 3.6–5.1)
Alkaline phosphatase (APISO): 53 U/L (ref 40–115)
BUN: 23 mg/dL (ref 7–25)
CO2: 25 mmol/L (ref 20–32)
Calcium: 9 mg/dL (ref 8.6–10.3)
Chloride: 107 mmol/L (ref 98–110)
Creat: 1.03 mg/dL (ref 0.70–1.18)
GFR, Est African American: 81 mL/min/{1.73_m2} (ref >=60)
GFR, Est Non African American: 70 mL/min/{1.73_m2} (ref >=60)
Globulin: 2.1 g/dL (calc) (ref 1.9–3.7)
Glucose, Bld: 130 mg/dL — ABNORMAL HIGH (ref 65–99)
POTASSIUM: 4.5 mmol/L (ref 3.5–5.3)
Sodium: 140 mmol/L (ref 135–146)
TOTAL PROTEIN: 6.1 g/dL (ref 6.1–8.1)
Total Bilirubin: 1.3 mg/dL — ABNORMAL HIGH (ref 0.2–1.2)

## 2018-09-19 LAB — LIPID PANEL
CHOL/HDL RATIO: 5.1 (calc) — AB (ref <5.0)
Cholesterol: 193 mg/dL (ref <200)
HDL: 38 mg/dL — ABNORMAL LOW (ref >40)
LDL Cholesterol (Calc): 138 mg/dL (calc) — ABNORMAL HIGH
NON-HDL CHOLESTEROL (CALC): 155 mg/dL — AB (ref <130)
Triglycerides: 73 mg/dL (ref <150)

## 2018-09-22 ENCOUNTER — Ambulatory Visit (INDEPENDENT_AMBULATORY_CARE_PROVIDER_SITE_OTHER): Payer: Medicare Other | Admitting: Family Medicine

## 2018-09-22 ENCOUNTER — Other Ambulatory Visit: Payer: Self-pay

## 2018-09-22 ENCOUNTER — Encounter: Payer: Self-pay | Admitting: Family Medicine

## 2018-09-22 VITALS — BP 128/80 | HR 86 | Temp 98.1°F | Resp 14 | Ht 71.0 in | Wt 190.0 lb

## 2018-09-22 DIAGNOSIS — Z125 Encounter for screening for malignant neoplasm of prostate: Secondary | ICD-10-CM | POA: Diagnosis not present

## 2018-09-22 DIAGNOSIS — J410 Simple chronic bronchitis: Secondary | ICD-10-CM

## 2018-09-22 DIAGNOSIS — E119 Type 2 diabetes mellitus without complications: Secondary | ICD-10-CM | POA: Diagnosis not present

## 2018-09-22 DIAGNOSIS — I700 Atherosclerosis of aorta: Secondary | ICD-10-CM | POA: Insufficient documentation

## 2018-09-22 DIAGNOSIS — N400 Enlarged prostate without lower urinary tract symptoms: Secondary | ICD-10-CM | POA: Diagnosis not present

## 2018-09-22 DIAGNOSIS — R7303 Prediabetes: Secondary | ICD-10-CM | POA: Insufficient documentation

## 2018-09-22 DIAGNOSIS — Z0000 Encounter for general adult medical examination without abnormal findings: Secondary | ICD-10-CM | POA: Diagnosis not present

## 2018-09-22 LAB — PSA: PSA: 4.3 ng/mL — ABNORMAL HIGH (ref <=4.0)

## 2018-09-22 MED ORDER — ROSUVASTATIN CALCIUM 5 MG PO TABS: 5.0000 mg | ORAL_TABLET | Freq: Every day | ORAL | 3 refills | Status: DC

## 2018-09-22 MED ORDER — TAMSULOSIN HCL 0.4 MG PO CAPS: 0.8000 mg | ORAL_CAPSULE | Freq: Every day | ORAL | 2 refills | Status: DC

## 2018-09-22 NOTE — Addendum Note (Signed)
Addended by: Vic Blackbird F on: 09/22/2018 01:56 PM   Modules accepted: Orders

## 2018-09-22 NOTE — Patient Instructions (Signed)
Start crestor at bedtime We will call with PSA test results Increase flomax to 2 capsules in the evening  F/U 4 months - needs fasting labs before visit

## 2018-09-22 NOTE — Progress Notes (Signed)
Subjective:   Patient presents for Medicare Annual/Subsequent preventive examination.   Patient here to follow-up chronic medical problems review his labs and for wellness exam.  He is done quite well with regards to his neck pain he now only has discomfort.  He is taking Tylenol as needed for pain he is not on the tramadol or the hydrocodone.  States that he has kept his tramadol around just in case.  BPH he is currently on Flomax but he has had more difficulty with his urine stream past few months states that he has no pressure and often has a sensation to go but then cannot until a few minutes later where he then has urgency.  He denies any constipation. Due for PSA for prostate cancer screening he believes that his prostate worsen after he was on high-dose steroids for his neck pain   Aortic atherosclerosis noted on previous CTcardiac workup , LDL now 138   Diabetes mellitus-had glucose intolerance for the past 3 years his A1c is now converting to diabetes 6.5%. He admits that he eats what he wants  Review Past Medical/Family/Social: Per EMR   Risk Factors  Current exercise habits: walks Dietary issues discussed: Yes  Cardiac risk factors: Glucose intolerance, Hyperlipidemia   Depression Screen  (Note: if answer to either of the following is "Yes", a more complete depression screening is indicated)  Over the past two weeks, have you felt down, depressed or hopeless? No Over the past two weeks, have you felt little interest or pleasure in doing things? No Have you lost interest or pleasure in daily life? No Do you often feel hopeless? No Do you cry easily over simple problems? No   Activities of Daily Living  In your present state of health, do you have any difficulty performing the following activities?:  Driving? No  Managing money? No  Feeding yourself? No  Getting from bed to chair? No  Climbing a flight of stairs? No  Preparing food and eating?: No  Bathing or  showering? No  Getting dressed: No  Getting to the toilet? No  Using the toilet:No  Moving around from place to place: No  In the past year have you fallen or had a near fall?:No  Are you sexually active? No  Do you have more than one partner? No   Hearing Difficulties: No  Do you often ask people to speak up or repeat themselves? No  Do you experience ringing or noises in your ears? No Do you have difficulty understanding soft or whispered voices? No  Do you feel that you have a problem with memory? No Do you often misplace items? No  Do you feel safe at home? Yes  Cognitive Testing  Alert? Yes Normal Appearance?Yes  Oriented to person? Yes Place? Yes  Time? Yes  Recall of three objects? Yes  Can perform simple calculations? Yes  Displays appropriate judgment?Yes  Can read the correct time from a watch face?Yes   List the Names of Other Physician/Practitioners you currently use:   Orthopedics  My eye doctor [facility]  Screening Tests / Date Colonoscopy    UTD                 Zostavax  UTD Pneumonia- UTD Influenza Vaccine  UTD Tetanus/tdap UTD   ROS: GEN- denies fatigue, fever, weight loss,weakness, recent illness HEENT- denies eye drainage, change in vision, nasal discharge, CVS- denies chest pain, palpitations RESP- denies SOB, cough, wheeze ABD- denies N/V, change in stools, abd  pain GU- denies dysuria, hematuria, dribbling, incontinence MSK- denies joint pain, muscle aches, injury Neuro- denies headache, dizziness, syncope, seizure activity  Physical: GEN- NAD, alert and oriented x3 HEENT- PERRL, EOMI, non injected sclera, pink conjunctiva, MMM, oropharynx clear, TM clear bilat no effusio Neck- Supple, no thyromegaly, no bruit  CVS- RRR, no murmur RESP-CTAB ABD-NABS,soft,NT,ND EXT- No edema Pulses- Radial, DP- 2+    Assessment:    Annual wellness medicare exam   Plan:    During the course of the visit the patient was educated and counseled about  appropriate screening and preventive services including:   Patient declined information on advanced directives  Audit C depression screen negative he has had a fall secondary to change in his vision which he has seen the eye doctor for.  BPH worsening symptoms he is unable to leave a urine sample today with not have any UTI symptoms.  Increase his Flomax to 0.8 mg PSA to be done for prostate cancer screening  Aortic atherosclerosis start Crestor 5 mg at bedtime   Diabetes mellitus he declines any medications but actually at his current A1c and his age I would not start anything at this time discussed dietary changes that can be made.  Recheck in 4 months  Note he is still on the Colestipol for his bowel issues diarrhea IBS he states it is not working he wants to stop taking this recommend that he call his gastroenterologist  COPD- no inhaler use, no difficultuy breathing    Diet review for nutrition referral? Yes ____ Not Indicated __x__  Patient Instructions (the written plan) was given to the patient.  Medicare Attestation  I have personally reviewed:  The patient's medical and social history  Their use of alcohol, tobacco or illicit drugs  Their current medications and supplements  The patient's functional ability including ADLs,fall risks, home safety risks, cognitive, and hearing and visual impairment  Diet and physical activities  Evidence for depression or mood disorders  The patient's weight, height, BMI, and visual acuity have been recorded in the chart. I have made referrals, counseling, and provided education to the patient based on review of the above and I have provided the patient with a written personalized care plan for preventive services.

## 2018-09-25 ENCOUNTER — Other Ambulatory Visit: Payer: Self-pay | Admitting: *Deleted

## 2018-09-29 ENCOUNTER — Encounter: Payer: Self-pay | Admitting: *Deleted

## 2018-10-02 ENCOUNTER — Ambulatory Visit (INDEPENDENT_AMBULATORY_CARE_PROVIDER_SITE_OTHER): Payer: Medicare Other | Admitting: Family Medicine

## 2018-10-02 VITALS — BP 130/92 | HR 86 | Temp 97.6°F | Resp 16 | Ht 71.0 in | Wt 194.4 lb

## 2018-10-02 DIAGNOSIS — M2550 Pain in unspecified joint: Secondary | ICD-10-CM | POA: Diagnosis not present

## 2018-10-02 DIAGNOSIS — R600 Localized edema: Secondary | ICD-10-CM

## 2018-10-02 DIAGNOSIS — R609 Edema, unspecified: Secondary | ICD-10-CM | POA: Diagnosis not present

## 2018-10-02 MED ORDER — PREDNISONE 20 MG PO TABS: ORAL_TABLET | ORAL | 0 refills | Status: DC

## 2018-10-02 NOTE — Progress Notes (Signed)
Patient ID: Benjamin [name], male    DOB: 1941/02/23, 78 y.o.   MRN: [id number]  PCP: Alycia Rossetti, MD  Chief Complaint  Patient presents with  . Pain    Patient in with c/o pain in all joints    Subjective:   Benjamin [name] is a 78 y.o. male, presents to clinic with CC of severe pain and diffuse swelling to his "whole body" to "all his joints" onset gradually x 1 week, and much worse over the past 3 days.  He endorses gradually worsening pain rated severe - described as achey, worse with movement.  He has constant pain 3/10 with any movement 7/10.  He's "up 10 lbs" but this does include over the holidays.  He says the pain is mostly felt in the joints and less in the muscles.  He has not had any fever, redness.  He feels swollen everything, he shows me his knuckles saying even they are swollen.  Because it hurts to move anything everything he states he has not been out much.  He denies any possible tick bite, sick contacts, nausea, vomiting, cough, fever, rash.  He says he gets headaches and they are no different than his normal baseline. He has hx of CAD/CHF, COPD -although he endorses weight loss he was seen here 10 days ago and he is only up by 4 pounds this is the same as about 5 or 6 months ago.  Denies any orthopnea, PND, change to his baseline shortness of breath.  Chest pain, palpitation, near syncope, diarrhea.   Wt Readings from Last 5 Encounters:  10/02/18 194 lb 6 oz (88.2 kg)  09/22/18 190 lb (86.2 kg)  04/10/18 194 lb (88 kg)  03/24/18 184 lb (83.5 kg)  01/22/18 185 lb (83.9 kg)   10 days ago he had MWV - was started on crestor.  That's the only recent med change.   Pt states no other associated sx, med changes, no other alleviating or aggravating factors.  He has taken pain med w/o improvement to his current pain.      Patient Active Problem List   Diagnosis Date Noted  . Aortic atherosclerosis ([place]) 09/22/2018  . Type 2 diabetes mellitus without complications ([place])  84/16/6063  . Chronic diastolic CHF (congestive heart failure) ([place])   . Right lower quadrant abdominal pain   . Irritable bowel syndrome with diarrhea   . Bacteremia due to Escherichia coli   . Urine retention   . Glucose intolerance   . Palliative care by specialist   . Elevated troponin   . Coronary artery calcification seen on CAT scan   . Sepsis secondary to UTI ([place]) 05/06/2017  . Chronic bilateral thoracic back pain 02/12/2017  . GERD (gastroesophageal reflux disease) 11/27/2016  . Constipation 10/28/2016  . Erectile dysfunction 06/25/2016  . Chronic diarrhea 06/07/2016  . Duodenal ulcer 02/19/2016  . COPD (chronic obstructive pulmonary disease) ([place]) 10/20/2015  . DDD (degenerative disc disease), lumbar 01/11/2014  . Borderline diabetes 02/18/2013  . Hip pain 11/13/2012  . BPH (benign prostatic hyperplasia) 03/09/2012  . Cataract 03/09/2012     Prior to Admission medications   Medication Sig Start Date End Date Taking? Authorizing Provider  colestipol (COLESTID) 1 g tablet Take 2 g by mouth at bedtime.   Yes [provider]  Cranberry 1000 MG CAPS Take 1 capsule by mouth daily.    Yes [provider]  promethazine (PHENERGAN) 12.5 MG tablet Take 1 tablet (12.5 mg total)  by mouth every 8 (eight) hours as needed for nausea or vomiting. 02/12/17  Yes [place], Modena Nunnery, MD  RABEprazole (ACIPHEX) 20 MG tablet Take 1 tablet (20 mg total) by mouth daily. 02/03/17  Yes Annitta Needs, NP  rosuvastatin (CRESTOR) 5 MG tablet Take 1 tablet (5 mg total) by mouth daily. For cholesterol 09/22/18  Yes [place], Modena Nunnery, MD  tamsulosin (FLOMAX) 0.4 MG CAPS capsule Take 2 capsules (0.8 mg total) by mouth daily after supper. For prostate 09/22/18  Yes , Modena Nunnery, MD  traMADol (ULTRAM) 50 MG tablet Take 50-100 mg by mouth every 6 (six) hours as needed for moderate pain.   Yes [provider]     Allergies  Allergen Reactions  . Arsenic Swelling    Severe  swelling if patient comes in contact   . Contrast Media [Iodinated Diagnostic Agents]      Family History  Problem Relation Age of Onset  . Diabetes Mother   . COPD Mother   . Heart disease Mother   . Colon cancer Neg Hx      Social History   Socioeconomic History  . Marital status: Married    Spouse name: Not on file  . Number of children: Not on file  . Years of education: Not on file  . Highest education level: Not on file  Occupational History  . Occupation: retired  Scientific laboratory technician  . Financial resource strain: Not on file  . Food insecurity:    Worry: Not on file    Inability: Not on file  . Transportation needs:    Medical: Not on file    Non-medical: Not on file  Tobacco Use  . Smoking status: Former Smoker    Packs/day: 0.50    Years: 50.00    Pack years: 25.00    Types: Cigarettes    Last attempt to quit: 02/15/2015    Years since quitting: 3.6  . Smokeless tobacco: Never Used  . Tobacco comment: Quit x 1 year  Substance and Sexual Activity  . Alcohol use: Yes    Alcohol/week: 0.0 standard drinks    Comment: seldom, social   . Drug use: No  . Sexual activity: Yes  Lifestyle  . Physical activity:    Days per week: Not on file    Minutes per session: Not on file  . Stress: Not on file  Relationships  . Social connections:    Talks on phone: Not on file    Gets together: Not on file    Attends religious service: Not on file    Active member of club or organization: Not on file    Attends meetings of clubs or organizations: Not on file    Relationship status: Not on file  . Intimate partner violence:    Fear of current or ex partner: Not on file    Emotionally abused: Not on file    Physically abused: Not on file    Forced sexual activity: Not on file  Other Topics Concern  . Not on file  Social History Narrative  . Not on file     Review of Systems  Constitutional: Negative.   HENT: Negative.   Eyes: Negative.   Respiratory: Negative.     Cardiovascular: Negative.   Gastrointestinal: Negative.   Endocrine: Negative.   Genitourinary: Negative.   Musculoskeletal: Negative.   Skin: Negative.   Allergic/Immunologic: Negative.   Neurological: Negative.   Hematological: Negative.   Psychiatric/Behavioral: Negative.   All  other systems reviewed and are negative.      Objective:    Vitals:   10/02/18 1539  BP: (!) 130/92  Pulse: 86  Resp: 16  Temp: 97.6 F (36.4 C)  TempSrc: Oral  SpO2: 97%  Weight: 194 lb 6 oz (88.2 kg)  Height: 5\' 11"  (1.803 m)      Physical Exam Vitals signs and nursing note reviewed.  Constitutional:      General: He is not in acute distress.    Appearance: He is well-developed. He is not ill-appearing, toxic-appearing or diaphoretic.     Comments: Well appearing energetic elderly male, appears stated age, NAD  HENT:     Head: Normocephalic and atraumatic.     Right Ear: External ear normal.     Left Ear: External ear normal.     Nose: Nose normal.     Mouth/Throat:     Mouth: Mucous membranes are moist.     Pharynx: Oropharynx is clear.  Eyes:     General: No scleral icterus.       Right eye: No discharge.        Left eye: No discharge.     Conjunctiva/sclera: Conjunctivae normal.  Neck:     Trachea: No tracheal deviation.  Cardiovascular:     Rate and Rhythm: Normal rate and regular rhythm.     Pulses: Normal pulses.     Heart sounds: No murmur. No friction rub. No gallop.   Pulmonary:     Effort: Pulmonary effort is normal. No respiratory distress.     Breath sounds: Normal breath sounds. No stridor. No wheezing, rhonchi or rales.  Abdominal:     General: Bowel sounds are normal. There is no distension.     Palpations: Abdomen is soft.     Tenderness: There is no abdominal tenderness.  Musculoskeletal:     Comments: No pretibial LE edema bilaterally, mild non-pitting ankle edema b/l Muscle compartments soft of b/l UE and LE No indurattion/erythema or focal joint edema  noted B/l hands have very subtle edema   Skin:    General: Skin is warm and dry.     Coloration: Skin is not ashen, cyanotic, jaundiced or pale.     Findings: No rash.  Neurological:     Mental Status: He is alert.     Motor: No abnormal muscle tone.     Coordination: Coordination normal.  Psychiatric:        Behavior: Behavior normal.           Assessment & Plan:      ICD-10-CM   1. Polyarthralgia L93.79 COMPLETE METABOLIC PANEL WITH GFR    CK    CBC with Differential    TSH    T4, Free    Sedimentation Rate    C-reactive protein  2. Swelling K24.0 COMPLETE METABOLIC PANEL WITH GFR    CK    CBC with Differential    TSH    T4, Free    Sedimentation Rate    C-reactive protein    Brain natriuretic peptide  3. Lower extremity edema R60.0 Brain natriuretic peptide    1 week of gradually worsening and now severe diffuse joint pain and body aches.  No fever, chills, sweats or other signs or sx of infection. No known tick bite or sick contact.  Pt has increased weight 4 lbs since last appt, he is concerned and states its 10+ up for him, with some swelling in ankles and hands - he  does not appear to have signs or sx of CHF - so unsure if this is from diet intake? Other inflammatory process?  Pt denies any hx of autoimmune disease or gout.  He is well appearing, afebrile, and other than the mild ankle edema and reported hand edema, I cannot see anything acutely abnormal or alarming - no sign of infection, VSS - BP mildly elevated but he endorses severe pain and yells "FIX ME! When I walked into the room, so BP is likely elevated some secondary to pain.  He is afebrile.  Will check basic labs, kidney function, renal function and BNP and TSH or edema, acute phase reactants and CBC to assess polyarthralgia's.  Liver function and CK due to timing of starting crestor immediately prior to onset of sx, although at this time pain seems more in joints than muscles -all muscle compartments  soft, I doubt any severe myopathy but will check for CK and have him hold the Crestor for the next 1 to 2 weeks, he has narcotic pain medicine available to him is been ineffective, will do a short taper of prednisone for the inflammation patient encouraged to follow-up.   Delsa Grana, PA-C 10/02/18 3:53 PM

## 2018-10-02 NOTE — Patient Instructions (Signed)
Hold the crestor for the next week  Take the steroid  I will call you with labs.  Follow up with PCP next 1-2 weeks if not improving

## 2018-10-03 LAB — TSH: TSH: 3.04 mIU/L (ref 0.40–4.50)

## 2018-10-03 LAB — CK: Total CK: 41 U/L — ABNORMAL LOW (ref 44–196)

## 2018-10-03 LAB — COMPLETE METABOLIC PANEL WITH GFR
AG RATIO: 2 (calc) (ref 1.0–2.5)
ALT: 25 U/L (ref 9–46)
AST: 20 U/L (ref 10–35)
Albumin: 4.1 g/dL (ref 3.6–5.1)
Alkaline phosphatase (APISO): 54 U/L (ref 40–115)
BUN: 18 mg/dL (ref 7–25)
CO2: 26 mmol/L (ref 20–32)
Calcium: 9.2 mg/dL (ref 8.6–10.3)
Chloride: 106 mmol/L (ref 98–110)
Creat: 0.98 mg/dL (ref 0.70–1.18)
GFR, Est African American: 86 mL/min/{1.73_m2} (ref >=60)
GFR, Est Non African American: 75 mL/min/{1.73_m2} (ref >=60)
Globulin: 2.1 g/dL (calc) (ref 1.9–3.7)
Glucose, Bld: 111 mg/dL — ABNORMAL HIGH (ref 65–99)
POTASSIUM: 4.3 mmol/L (ref 3.5–5.3)
Sodium: 140 mmol/L (ref 135–146)
Total Bilirubin: 1.2 mg/dL (ref 0.2–1.2)
Total Protein: 6.2 g/dL (ref 6.1–8.1)

## 2018-10-03 LAB — T4, FREE: Free T4: 1.1 ng/dL (ref 0.8–1.8)

## 2018-10-03 LAB — CBC WITH DIFFERENTIAL/PLATELET
Absolute Monocytes: 522 cells/uL (ref 200–950)
Basophils Absolute: 70 cells/uL (ref 0–200)
Basophils Relative: 1.2 %
EOS PCT: 5.4 %
Eosinophils Absolute: 313 cells/uL (ref 15–500)
HCT: 45.6 % (ref 38.5–50.0)
Hemoglobin: 16 g/dL (ref 13.2–17.1)
Lymphs Abs: 1636 cells/uL (ref 850–3900)
MCH: 32.3 pg (ref 27.0–33.0)
MCHC: 35.1 g/dL (ref 32.0–36.0)
MCV: 92.1 fL (ref 80.0–100.0)
MPV: 9.8 fL (ref 7.5–12.5)
Monocytes Relative: 9 %
Neutro Abs: 3260 cells/uL (ref 1500–7800)
Neutrophils Relative %: 56.2 %
Platelets: 295 10*3/uL (ref 140–400)
RBC: 4.95 10*6/uL (ref 4.20–5.80)
RDW: 12.8 % (ref 11.0–15.0)
Total Lymphocyte: 28.2 %
WBC: 5.8 10*3/uL (ref 3.8–10.8)

## 2018-10-03 LAB — BRAIN NATRIURETIC PEPTIDE: Brain Natriuretic Peptide: 41 pg/mL (ref <100)

## 2018-10-03 LAB — C-REACTIVE PROTEIN: CRP: 5.3 mg/L (ref <8.0)

## 2018-10-03 LAB — SEDIMENTATION RATE: Sed Rate: 9 mm/h (ref 0–20)

## 2018-10-05 ENCOUNTER — Encounter: Payer: Self-pay | Admitting: Family Medicine

## 2018-10-06 DIAGNOSIS — M47812 Spondylosis without myelopathy or radiculopathy, cervical region: Secondary | ICD-10-CM | POA: Diagnosis not present

## 2018-10-06 DIAGNOSIS — M542 Cervicalgia: Secondary | ICD-10-CM | POA: Diagnosis not present

## 2018-10-06 DIAGNOSIS — M4722 Other spondylosis with radiculopathy, cervical region: Secondary | ICD-10-CM | POA: Diagnosis not present

## 2018-10-09 ENCOUNTER — Emergency Department (HOSPITAL_COMMUNITY)
Admission: EM | Admit: 2018-10-09 | Discharge: 2018-10-10 | Disposition: A | Discharge status: Home / Self Care | Payer: Medicare Other | Attending: Emergency Medicine | Admitting: Emergency Medicine

## 2018-10-09 ENCOUNTER — Emergency Department (HOSPITAL_COMMUNITY): Payer: Medicare Other

## 2018-10-09 ENCOUNTER — Other Ambulatory Visit: Payer: Self-pay

## 2018-10-09 DIAGNOSIS — E119 Type 2 diabetes mellitus without complications: Secondary | ICD-10-CM | POA: Insufficient documentation

## 2018-10-09 DIAGNOSIS — R5381 Other malaise: Secondary | ICD-10-CM

## 2018-10-09 DIAGNOSIS — Z87891 Personal history of nicotine dependence: Secondary | ICD-10-CM | POA: Diagnosis not present

## 2018-10-09 DIAGNOSIS — R0689 Other abnormalities of breathing: Secondary | ICD-10-CM | POA: Diagnosis not present

## 2018-10-09 DIAGNOSIS — R0902 Hypoxemia: Secondary | ICD-10-CM | POA: Diagnosis not present

## 2018-10-09 DIAGNOSIS — R000 Tachycardia, unspecified: Secondary | ICD-10-CM | POA: Diagnosis not present

## 2018-10-09 DIAGNOSIS — R5383 Other fatigue: Secondary | ICD-10-CM | POA: Diagnosis not present

## 2018-10-09 DIAGNOSIS — R0602 Shortness of breath: Secondary | ICD-10-CM | POA: Diagnosis not present

## 2018-10-09 DIAGNOSIS — R069 Unspecified abnormalities of breathing: Secondary | ICD-10-CM | POA: Diagnosis not present

## 2018-10-09 DIAGNOSIS — J449 Chronic obstructive pulmonary disease, unspecified: Secondary | ICD-10-CM | POA: Insufficient documentation

## 2018-10-09 DIAGNOSIS — I5032 Chronic diastolic (congestive) heart failure: Secondary | ICD-10-CM | POA: Insufficient documentation

## 2018-10-09 DIAGNOSIS — R410 Disorientation, unspecified: Secondary | ICD-10-CM | POA: Diagnosis not present

## 2018-10-09 DIAGNOSIS — R531 Weakness: Secondary | ICD-10-CM | POA: Insufficient documentation

## 2018-10-09 DIAGNOSIS — Z79899 Other long term (current) drug therapy: Secondary | ICD-10-CM | POA: Diagnosis not present

## 2018-10-09 LAB — COMPREHENSIVE METABOLIC PANEL
ALT: 25 U/L (ref 0–44)
AST: 22 U/L (ref 15–41)
Albumin: 3.6 g/dL (ref 3.5–5.0)
Alkaline Phosphatase: 50 U/L (ref 38–126)
Anion gap: 12 (ref 5–15)
BUN: 21 mg/dL (ref 8–23)
CO2: 20 mmol/L — AB (ref 22–32)
Calcium: 8.8 mg/dL — ABNORMAL LOW (ref 8.9–10.3)
Chloride: 109 mmol/L (ref 98–111)
Creatinine, Ser: 1.22 mg/dL (ref 0.61–1.24)
GFR calc Af Amer: >60 mL/min (ref >60)
GFR, EST NON AFRICAN AMERICAN: 57 mL/min — AB (ref >60)
Glucose, Bld: 150 mg/dL — ABNORMAL HIGH (ref 70–99)
Potassium: 3.6 mmol/L (ref 3.5–5.1)
Sodium: 141 mmol/L (ref 135–145)
Total Bilirubin: 1.2 mg/dL (ref 0.3–1.2)
Total Protein: 6.1 g/dL — ABNORMAL LOW (ref 6.5–8.1)

## 2018-10-09 LAB — CBC WITH DIFFERENTIAL/PLATELET
Abs Immature Granulocytes: 0.06 10*3/uL (ref 0.00–0.07)
Basophils Absolute: 0.1 10*3/uL (ref 0.0–0.1)
Basophils Relative: 1 %
Eosinophils Absolute: 0.2 10*3/uL (ref 0.0–0.5)
Eosinophils Relative: 3 %
HEMATOCRIT: 46.4 % (ref 39.0–52.0)
HEMOGLOBIN: 15.1 g/dL (ref 13.0–17.0)
Immature Granulocytes: 1 %
LYMPHS ABS: 2 10*3/uL (ref 0.7–4.0)
Lymphocytes Relative: 28 %
MCH: 31.1 pg (ref 26.0–34.0)
MCHC: 32.5 g/dL (ref 30.0–36.0)
MCV: 95.7 fL (ref 80.0–100.0)
Monocytes Absolute: 0.6 10*3/uL (ref 0.1–1.0)
Monocytes Relative: 9 %
NEUTROS ABS: 4.3 10*3/uL (ref 1.7–7.7)
Neutrophils Relative %: 58 %
Platelets: 254 10*3/uL (ref 150–400)
RBC: 4.85 MIL/uL (ref 4.22–5.81)
RDW: 13.3 % (ref 11.5–15.5)
WBC: 7.2 10*3/uL (ref 4.0–10.5)
nRBC: 0 % (ref 0.0–0.2)

## 2018-10-09 LAB — I-STAT TROPONIN, ED: Troponin i, poc: 0.01 ng/mL (ref 0.00–0.08)

## 2018-10-09 MED ORDER — SODIUM CHLORIDE 0.9% FLUSH
3.0000 mL | Freq: Once | INTRAVENOUS | Status: AC
  Administered 2018-10-10: 3 mL via INTRAVENOUS

## 2018-10-09 NOTE — ED Triage Notes (Signed)
Patient called EMS for SOB for one day. Also c/o dark urine with dysuria. Pt was given 5mg  Albuterol via EMS. Tylenol last taken at 2pm

## 2018-10-09 NOTE — ED Notes (Signed)
Patient transported to X-ray 

## 2018-10-10 DIAGNOSIS — R531 Weakness: Secondary | ICD-10-CM | POA: Diagnosis not present

## 2018-10-10 LAB — URINALYSIS, ROUTINE W REFLEX MICROSCOPIC
BILIRUBIN URINE: NEGATIVE
Glucose, UA: NEGATIVE mg/dL
Hgb urine dipstick: NEGATIVE
Ketones, ur: NEGATIVE mg/dL
Leukocytes, UA: NEGATIVE
Nitrite: NEGATIVE
Protein, ur: NEGATIVE mg/dL
Specific Gravity, Urine: 1.027 (ref 1.005–1.030)
pH: 5 (ref 5.0–8.0)

## 2018-10-10 LAB — LACTIC ACID, PLASMA: LACTIC ACID, VENOUS: 2.6 mmol/L — AB (ref 0.5–1.9)

## 2018-10-10 MED ORDER — CEPHALEXIN 250 MG PO CAPS
500.0000 mg | ORAL_CAPSULE | Freq: Once | ORAL | Status: AC
  Administered 2018-10-10: 500 mg via ORAL
  Filled 2018-10-10: qty 2

## 2018-10-10 MED ORDER — SODIUM CHLORIDE 0.9 % IV BOLUS
500.0000 mL | Freq: Once | INTRAVENOUS | Status: AC
  Administered 2018-10-10: 500 mL via INTRAVENOUS

## 2018-10-10 MED ORDER — CEPHALEXIN 500 MG PO CAPS: 500.0000 mg | ORAL_CAPSULE | Freq: Two times a day (BID) | ORAL | 0 refills | Status: DC

## 2018-10-10 NOTE — ED Notes (Signed)
Bladder scan performed. Reading at 176ml .
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
## 2018-10-10 NOTE — Discharge Instructions (Addendum)
We saw you in the ER for weakness. All the results in the ER are normal, labs and imaging. We are not sure what is causing your symptoms.  Given that you are having burning with urination, we are sending you home with antibiotics for a urinary tract infection. The workup in the ER is not complete, and is limited to screening for life threatening and emergent conditions only, so please see a primary care doctor for further evaluation.  Please return to the ER if your symptoms worsen; you have increased pain, fevers, chills, inability to keep any medications down, confusion. Otherwise see the outpatient doctor as requested.

## 2018-10-11 LAB — URINE CULTURE: Culture: <10000 — AB

## 2018-10-12 DIAGNOSIS — M542 Cervicalgia: Secondary | ICD-10-CM | POA: Diagnosis not present

## 2018-10-12 DIAGNOSIS — M47892 Other spondylosis, cervical region: Secondary | ICD-10-CM | POA: Diagnosis not present

## 2018-10-12 DIAGNOSIS — M4802 Spinal stenosis, cervical region: Secondary | ICD-10-CM | POA: Diagnosis not present

## 2018-10-16 DIAGNOSIS — M542 Cervicalgia: Secondary | ICD-10-CM | POA: Diagnosis not present

## 2018-10-16 DIAGNOSIS — M4802 Spinal stenosis, cervical region: Secondary | ICD-10-CM | POA: Diagnosis not present

## 2018-10-16 DIAGNOSIS — M47892 Other spondylosis, cervical region: Secondary | ICD-10-CM | POA: Diagnosis not present

## 2018-10-19 ENCOUNTER — Ambulatory Visit (INDEPENDENT_AMBULATORY_CARE_PROVIDER_SITE_OTHER): Payer: Medicare Other | Admitting: Family Medicine

## 2018-10-19 ENCOUNTER — Other Ambulatory Visit: Payer: Self-pay

## 2018-10-19 ENCOUNTER — Encounter: Payer: Self-pay | Admitting: Family Medicine

## 2018-10-19 VITALS — BP 130/90 | HR 80 | Temp 97.9°F | Resp 14 | Ht 71.0 in | Wt 197.0 lb

## 2018-10-19 DIAGNOSIS — Z0184 Encounter for antibody response examination: Secondary | ICD-10-CM

## 2018-10-19 DIAGNOSIS — N390 Urinary tract infection, site not specified: Secondary | ICD-10-CM | POA: Diagnosis not present

## 2018-10-19 DIAGNOSIS — R609 Edema, unspecified: Secondary | ICD-10-CM

## 2018-10-19 DIAGNOSIS — M47892 Other spondylosis, cervical region: Secondary | ICD-10-CM | POA: Diagnosis not present

## 2018-10-19 DIAGNOSIS — R0600 Dyspnea, unspecified: Secondary | ICD-10-CM | POA: Diagnosis not present

## 2018-10-19 DIAGNOSIS — Z111 Encounter for screening for respiratory tuberculosis: Secondary | ICD-10-CM | POA: Diagnosis not present

## 2018-10-19 DIAGNOSIS — M4802 Spinal stenosis, cervical region: Secondary | ICD-10-CM | POA: Diagnosis not present

## 2018-10-19 DIAGNOSIS — M542 Cervicalgia: Secondary | ICD-10-CM | POA: Diagnosis not present

## 2018-10-19 MED ORDER — FUROSEMIDE 20 MG PO TABS: 20.0000 mg | ORAL_TABLET | Freq: Every day | ORAL | 1 refills | Status: DC

## 2018-10-19 NOTE — Patient Instructions (Signed)
Start lasix 1 a day  Recheck Friday

## 2018-10-19 NOTE — Progress Notes (Signed)
Subjective:    Patient ID: Benjamin [name], male    DOB: [birth year], 78 y.o.   MRN: [id number]  Patient presents for ER F/U (AMS, malaise, fever) and Titers (needs titer or immunization for Hep B for Hospice, also needs TB testing. )  Patient here for ER follow-up.  Seen in the ER on January 25 after he had been in the office on January 17. Office visit on the 17th he stated he had severe pain swelling in all of his joints his whole body hurt for about 3 days.  He denied any difficulty breathing chest pain no fever or URI symptoms.  He also states that he had gained 10 pounds but there was only a 4 pound weight gain.  Over about a 10-day period of time.  His exam showed very subtle edema in his hands he had also mild swelling in his ankles Told to hold his Crestor and labs were performed.  He was also given a short taper of prednisone. His labs were reviewed he did not have any elevated inflammatory markers, CBC metabolic panel were unremarkable thyroid and BNP were also normal. When he was seen in the ER 1/24 he also complained of generalized weakness feeling unwell for 2 weeks.  He still denied any URI symptoms chest pain shortness of breath his exam at that time was unremarkable vital signs are stable. X-ray showed small left pleural effusion with atelectasis and pulmonary congestion without overt edema. EKG were unremarkable.  He had mildly elevated lactic acid, He was started on Keflex for probable urinary tract infection as he has had this in the past.  Urine culture came back with insignificant growth.   Getting PT for neck -has had 2 sessions  He continues to have some swelling especially in his lower extremities around the ankles that are very tight as well as his hands.  He also feels short of breath when he lays flat but states that he only sleeps on 1-1/2 pillows.  He denies any chest pain.  Review Of Systems:  GEN- denies fatigue, fever, weight loss,weakness, recent  illness HEENT- denies eye drainage, change in vision, nasal discharge, CVS- denies chest pain, palpitations RESP- +SOB, cough, wheeze ABD- denies N/V, change in stools, abd pain GU- denies dysuria, hematuria, dribbling, incontinence MSK- denies joint pain, muscle aches, injury Neuro- denies headache, dizziness, syncope, seizure activity       Objective:    BP 130/90   Pulse 80   Temp 97.9 F (36.6 C) (Oral)   Resp 14   Ht 5\' 11"  (1.803 m)   Wt 197 lb (89.4 kg)   SpO2 95%   BMI 27.48 kg/m  GEN- NAD, alert and oriented x3 weight up 3 pounds since January 17 HEENT- PERRL, EOMI, non injected sclera, pink conjunctiva, MMM, oropharynx clear Neck- Supple, no thyromegaly, no JVD CVS- RRR, no murmur RESP-CTAB ABD-NABS,soft,NT,ND EXT-pitting edema to mid shins edema Pulses- Radial, DP- 2+, varicose veins        Assessment & Plan:      Problem List Items Addressed This Visit    None    Visit Diagnoses    Urinary tract infection without hematuria, site unspecified    -  Primary   Recheck urine culture and urinalysis some of his initial symptoms with the confusion he did present like his previous urosepsis.  That has cleared up   Relevant Orders   Urinalysis, Routine w reflex microscopic   Urine Culture   Lactic Acid,  Plasma   CBC with Differential/Platelet   Comprehensive metabolic panel   Immunity status testing       Needs hepatitis B and TB screen to volunteer   Relevant Orders   Hepatitis B surface antibody,quantitative   Screening-pulmonary TB       Relevant Orders   QuantiFERON-TB Gold Plus   Dyspnea, unspecified type       Discerned that his fluid overload and weight gain is secondary to congestive heart failure will obtain a 2D echo he did have some mild diastolic dysfunction 2 years ago.  I am going to start him on Lasix 20 mg once a day.  Echo to be done recheck him on Friday, normal sat and BP   Relevant Orders   ECHOCARDIOGRAM COMPLETE   Peripheral edema        Relevant Orders   ECHOCARDIOGRAM COMPLETE      Note: This dictation was prepared with Dragon dictation along with smaller phrase technology. Any transcriptional errors that result from this process are unintentional.

## 2018-10-20 LAB — URINALYSIS, ROUTINE W REFLEX MICROSCOPIC
Bilirubin Urine: NEGATIVE
Glucose, UA: NEGATIVE
Hgb urine dipstick: NEGATIVE
Ketones, ur: NEGATIVE
LEUKOCYTES UA: NEGATIVE
Nitrite: NEGATIVE
Protein, ur: NEGATIVE
Specific Gravity, Urine: 1.016 (ref 1.001–1.03)
pH: <=5 (ref 5.0–8.0)

## 2018-10-20 LAB — URINE CULTURE
MICRO NUMBER:: 142221
Result:: NO GROWTH
SPECIMEN QUALITY:: ADEQUATE

## 2018-10-21 LAB — COMPREHENSIVE METABOLIC PANEL
AG Ratio: 1.9 (calc) (ref 1.0–2.5)
ALT: 30 U/L (ref 9–46)
AST: 24 U/L (ref 10–35)
Albumin: 4.2 g/dL (ref 3.6–5.1)
Alkaline phosphatase (APISO): 60 U/L (ref 35–144)
BILIRUBIN TOTAL: 1.3 mg/dL — AB (ref 0.2–1.2)
BUN: 14 mg/dL (ref 7–25)
CALCIUM: 9.3 mg/dL (ref 8.6–10.3)
CO2: 21 mmol/L (ref 20–32)
Chloride: 106 mmol/L (ref 98–110)
Creat: 0.97 mg/dL (ref 0.70–1.18)
Globulin: 2.2 g/dL (calc) (ref 1.9–3.7)
Glucose, Bld: 106 mg/dL — ABNORMAL HIGH (ref 65–99)
Potassium: 4.3 mmol/L (ref 3.5–5.3)
Sodium: 139 mmol/L (ref 135–146)
Total Protein: 6.4 g/dL (ref 6.1–8.1)

## 2018-10-21 LAB — CBC WITH DIFFERENTIAL/PLATELET
Absolute Monocytes: 476 cells/uL (ref 200–950)
Basophils Absolute: 88 cells/uL (ref 0–200)
Basophils Relative: 1.3 %
Eosinophils Absolute: 211 cells/uL (ref 15–500)
Eosinophils Relative: 3.1 %
HCT: 46.1 % (ref 38.5–50.0)
Hemoglobin: 16.2 g/dL (ref 13.2–17.1)
Lymphs Abs: 1578 cells/uL (ref 850–3900)
MCH: 32.7 pg (ref 27.0–33.0)
MCHC: 35.1 g/dL (ref 32.0–36.0)
MCV: 92.9 fL (ref 80.0–100.0)
MONOS PCT: 7 %
MPV: 9.8 fL (ref 7.5–12.5)
NEUTROS ABS: 4447 {cells}/uL (ref 1500–7800)
Neutrophils Relative %: 65.4 %
Platelets: 277 10*3/uL (ref 140–400)
RBC: 4.96 10*6/uL (ref 4.20–5.80)
RDW: 12.8 % (ref 11.0–15.0)
Total Lymphocyte: 23.2 %
WBC: 6.8 10*3/uL (ref 3.8–10.8)

## 2018-10-21 LAB — QUANTIFERON-TB GOLD PLUS
Mitogen-NIL: >10 IU/mL
NIL: 0.02 IU/mL
QuantiFERON-TB Gold Plus: NEGATIVE
TB1-NIL: 0.07 [IU]/mL
TB2-NIL: 0.1 IU/mL

## 2018-10-21 LAB — LACTIC ACID, PLASMA: LACTIC ACID: 1.8 mmol/L (ref 0.4–1.8)

## 2018-10-21 LAB — HEPATITIS B SURFACE ANTIBODY, QUANTITATIVE: Hep B S AB Quant (Post): <5 m[IU]/mL — ABNORMAL LOW (ref >=10)

## 2018-10-22 ENCOUNTER — Encounter: Payer: Self-pay | Admitting: *Deleted

## 2018-10-23 ENCOUNTER — Ambulatory Visit (INDEPENDENT_AMBULATORY_CARE_PROVIDER_SITE_OTHER): Payer: Medicare Other | Admitting: *Deleted

## 2018-10-23 ENCOUNTER — Ambulatory Visit (HOSPITAL_COMMUNITY)
Admission: RE | Admit: 2018-10-23 | Discharge: 2018-10-23 | Disposition: A | Discharge status: Home / Self Care | Payer: Medicare Other | Source: Ambulatory Visit | Attending: Family Medicine | Admitting: Family Medicine

## 2018-10-23 DIAGNOSIS — M47892 Other spondylosis, cervical region: Secondary | ICD-10-CM | POA: Diagnosis not present

## 2018-10-23 DIAGNOSIS — R0600 Dyspnea, unspecified: Secondary | ICD-10-CM | POA: Diagnosis not present

## 2018-10-23 DIAGNOSIS — M4802 Spinal stenosis, cervical region: Secondary | ICD-10-CM | POA: Diagnosis not present

## 2018-10-23 DIAGNOSIS — M542 Cervicalgia: Secondary | ICD-10-CM | POA: Diagnosis not present

## 2018-10-23 DIAGNOSIS — Z23 Encounter for immunization: Secondary | ICD-10-CM | POA: Diagnosis not present

## 2018-10-23 DIAGNOSIS — R609 Edema, unspecified: Secondary | ICD-10-CM

## 2018-10-23 NOTE — Progress Notes (Signed)
*  PRELIMINARY RESULTS* Echocardiogram 2D Echocardiogram has been performed.  Benjamin [name] 10/23/2018, 10:12 AM

## 2018-10-26 DIAGNOSIS — M47892 Other spondylosis, cervical region: Secondary | ICD-10-CM | POA: Diagnosis not present

## 2018-10-26 DIAGNOSIS — M4802 Spinal stenosis, cervical region: Secondary | ICD-10-CM | POA: Diagnosis not present

## 2018-10-26 DIAGNOSIS — M542 Cervicalgia: Secondary | ICD-10-CM | POA: Diagnosis not present

## 2018-10-30 DIAGNOSIS — M4802 Spinal stenosis, cervical region: Secondary | ICD-10-CM | POA: Diagnosis not present

## 2018-10-30 DIAGNOSIS — M47892 Other spondylosis, cervical region: Secondary | ICD-10-CM | POA: Diagnosis not present

## 2018-10-30 DIAGNOSIS — M542 Cervicalgia: Secondary | ICD-10-CM | POA: Diagnosis not present

## 2018-11-02 DIAGNOSIS — M47892 Other spondylosis, cervical region: Secondary | ICD-10-CM | POA: Diagnosis not present

## 2018-11-02 DIAGNOSIS — M4802 Spinal stenosis, cervical region: Secondary | ICD-10-CM | POA: Diagnosis not present

## 2018-11-02 DIAGNOSIS — M542 Cervicalgia: Secondary | ICD-10-CM | POA: Diagnosis not present

## 2018-11-04 ENCOUNTER — Encounter: Payer: Self-pay | Admitting: *Deleted

## 2018-11-04 DIAGNOSIS — R0600 Dyspnea, unspecified: Secondary | ICD-10-CM

## 2018-11-04 DIAGNOSIS — I5032 Chronic diastolic (congestive) heart failure: Secondary | ICD-10-CM

## 2018-11-04 DIAGNOSIS — R609 Edema, unspecified: Secondary | ICD-10-CM

## 2018-11-06 ENCOUNTER — Other Ambulatory Visit: Payer: Self-pay

## 2018-11-06 ENCOUNTER — Encounter: Payer: Self-pay | Admitting: Family Medicine

## 2018-11-06 ENCOUNTER — Ambulatory Visit (INDEPENDENT_AMBULATORY_CARE_PROVIDER_SITE_OTHER): Payer: Medicare Other | Admitting: Family Medicine

## 2018-11-06 VITALS — BP 128/70 | HR 100 | Temp 98.1°F | Resp 16 | Ht 71.0 in | Wt 196.4 lb

## 2018-11-06 DIAGNOSIS — I5032 Chronic diastolic (congestive) heart failure: Secondary | ICD-10-CM

## 2018-11-06 DIAGNOSIS — R0600 Dyspnea, unspecified: Secondary | ICD-10-CM | POA: Diagnosis not present

## 2018-11-06 DIAGNOSIS — J410 Simple chronic bronchitis: Secondary | ICD-10-CM | POA: Diagnosis not present

## 2018-11-06 MED ORDER — ALBUTEROL SULFATE (2.5 MG/3ML) 0.083% IN NEBU: 2.5000 mg | INHALATION_SOLUTION | Freq: Four times a day (QID) | RESPIRATORY_TRACT | 2 refills | Status: DC | PRN

## 2018-11-06 MED ORDER — FLUTICASONE FUROATE-VILANTEROL 100-25 MCG/INH IN AEPB: 1.0000 | INHALATION_SPRAY | Freq: Every day | RESPIRATORY_TRACT | 0 refills | Status: DC

## 2018-11-06 NOTE — Progress Notes (Signed)
Subjective:    Patient ID: Benjamin [name], male    DOB: 01/01/1941, 78 y.o.   MRN: [id number]  Patient presents for Follow-up (is not fasting) Patient here to follow-up shortness of breath and echocardiogram.  When he was seen on February 3 he placed dyspnea with exertion.  He did have some mild diastolic dysfunction on an old echo a few years ago.  I started him on Lasix 20 mg once a day.  His weight was 197 pounds at that visit.   Weight at home today 191.4  ( ranging 189.7-195), has not noticed the joint swelling has gone down except chronic swelling in his right hand Not noticed any significant difference in his urination however his wife states he is always in the bathroom urinating.  Repeat echocardiogram done on February 7 showed normal ejection fraction of 55 to 60% he did have some borderline LVH.  No significant valve problems.  He has impaired diastolic relaxation. He had a chest x-ray done on January 24 when he was in the emergency room that showed probable small left effusion he also had interstitial congestion and mild cardiomegaly  He however admits that his breathing gets worse in the evening times and when he lays down.  He has had a rattling sound in his chest and a gurgling per his wife.  We used albuterol neb last night which actually improved him.  He does have history of COPD which is been mild he has not been on any preventative medications.  Wife checked his oxygen sat at home and is typically 93 to 94%.  He states that he still able to do his regular activities of daily and dress himself without having significant shortness of breath.  He denies any palpitations or chest pain.      Review Of Systems: per above   GEN- denies fatigue, fever, weight loss,weakness, recent illness HEENT- denies eye drainage, change in vision, nasal discharge, CVS- denies chest pain, palpitations RESP- +SOB,  Denies cough, wheeze ABD- denies N/V, change in stools, abd pain GU- denies dysuria,  hematuria, dribbling, incontinence MSK- denies joint pain, muscle aches, injury Neuro- denies headache, dizziness, syncope, seizure activity       Objective:    BP 128/70   Pulse 100   Temp 98.1 F (36.7 C) (Oral)   Resp 16   Ht 5\' 11"  (1.803 m)   Wt 196 lb 7.2 oz (89.1 kg)   SpO2 94%   BMI 27.40 kg/m  GEN- NAD, alert and oriented x3 HEENT- PERRL, EOMI, non injected sclera, pink conjunctiva, MMM, oropharynx clear Neck- Supple, no thyromegaly, no JVD CVS- RRR, no murmur RESP-CTAB,WALKING SAT down to 90%, quickly rebounded to 93%, HR up to 115 ABD-NABS,soft,NT,ND EXT- No edema Pulses- Radial, DP- 2+        Assessment & Plan:      Problem List Items Addressed This Visit      Unprioritized   Chronic diastolic CHF (congestive heart failure) ([place])    States that his dyspnea may be multifactorial.  He has known diastolic dysfunction noted back to 2018.  He is on Lasix 20 mg and tolerating with his blood pressure  I do not see any significant swelling on exam today.  He has preserved ejection fraction on the echo.  I think that his problem is a mix of his COPD and probable heart failure though the heart failure does not seem to be overwhelming at this time.  We will continue the Lasix at  the current dose.  I am and add Breo which he was shown how to use in the office for COPD component he was given a trial for 2 weeks to see if his breathing improved.  He also has the albuterol nebs and an inhaler that he can use.  His oxygen did rebound up fairly quickly and not can start him on any oxygen therapy at this time.  He is still able to do his activities regularly.  We will see how cardiology weighs in in a couple of weeks.      COPD (chronic obstructive pulmonary disease) (HCC)   Relevant Medications   fluticasone furoate-vilanterol (BREO ELLIPTA) 100-25 MCG/INH AEPB   albuterol (PROVENTIL) (2.5 MG/3ML) 0.083% nebulizer solution    Other Visit Diagnoses    Dyspnea, unspecified  type    -  Primary      Note: This dictation was prepared with Dragon dictation along with smaller phrase technology. Any transcriptional errors that result from this process are unintentional.

## 2018-11-06 NOTE — Assessment & Plan Note (Signed)
States that his dyspnea may be multifactorial.  He has known diastolic dysfunction noted back to 2018.  He is on Lasix 20 mg and tolerating with his blood pressure  I do not see any significant swelling on exam today.  He has preserved ejection fraction on the echo.  I think that his problem is a mix of his COPD and probable heart failure though the heart failure does not seem to be overwhelming at this time.  We will continue the Lasix at the current dose.  I am and add Breo which he was shown how to use in the office for COPD component he was given a trial for 2 weeks to see if his breathing improved.  He also has the albuterol nebs and an inhaler that he can use.  His oxygen did rebound up fairly quickly and not can start him on any oxygen therapy at this time.  He is still able to do his activities regularly.  We will see how cardiology weighs in in a couple of weeks.

## 2018-11-06 NOTE — Patient Instructions (Addendum)
Try the breo once a day Use albuterol as needed F/U as previous

## 2018-11-09 DIAGNOSIS — M542 Cervicalgia: Secondary | ICD-10-CM | POA: Diagnosis not present

## 2018-11-09 DIAGNOSIS — M4802 Spinal stenosis, cervical region: Secondary | ICD-10-CM | POA: Diagnosis not present

## 2018-11-09 DIAGNOSIS — M47892 Other spondylosis, cervical region: Secondary | ICD-10-CM | POA: Diagnosis not present

## 2018-11-11 DIAGNOSIS — M47812 Spondylosis without myelopathy or radiculopathy, cervical region: Secondary | ICD-10-CM | POA: Diagnosis not present

## 2018-11-11 DIAGNOSIS — M542 Cervicalgia: Secondary | ICD-10-CM | POA: Diagnosis not present

## 2018-11-17 ENCOUNTER — Other Ambulatory Visit: Payer: Self-pay | Admitting: *Deleted

## 2018-11-17 MED ORDER — FLUTICASONE FUROATE-VILANTEROL 100-25 MCG/INH IN AEPB: 1.0000 | INHALATION_SPRAY | Freq: Every day | RESPIRATORY_TRACT | 11 refills | Status: DC

## 2018-11-23 ENCOUNTER — Ambulatory Visit (INDEPENDENT_AMBULATORY_CARE_PROVIDER_SITE_OTHER): Payer: Medicare Other | Admitting: *Deleted

## 2018-11-23 DIAGNOSIS — Z23 Encounter for immunization: Secondary | ICD-10-CM

## 2018-11-27 DIAGNOSIS — M654 Radial styloid tenosynovitis [de Quervain]: Secondary | ICD-10-CM | POA: Diagnosis not present

## 2018-12-03 ENCOUNTER — Encounter: Payer: Self-pay | Admitting: Cardiovascular Disease

## 2018-12-03 ENCOUNTER — Ambulatory Visit: Payer: Self-pay | Admitting: Cardiovascular Disease

## 2018-12-03 ENCOUNTER — Other Ambulatory Visit: Payer: Self-pay

## 2018-12-03 ENCOUNTER — Ambulatory Visit (INDEPENDENT_AMBULATORY_CARE_PROVIDER_SITE_OTHER): Payer: Medicare Other | Admitting: Cardiovascular Disease

## 2018-12-03 VITALS — BP 106/80 | HR 58 | Temp 97.8°F | Ht 70.0 in | Wt 192.0 lb

## 2018-12-03 DIAGNOSIS — I2510 Atherosclerotic heart disease of native coronary artery without angina pectoris: Secondary | ICD-10-CM

## 2018-12-03 DIAGNOSIS — R7989 Other specified abnormal findings of blood chemistry: Secondary | ICD-10-CM | POA: Diagnosis not present

## 2018-12-03 DIAGNOSIS — I5189 Other ill-defined heart diseases: Secondary | ICD-10-CM | POA: Diagnosis not present

## 2018-12-03 DIAGNOSIS — R778 Other specified abnormalities of plasma proteins: Secondary | ICD-10-CM

## 2018-12-03 DIAGNOSIS — R0609 Other forms of dyspnea: Secondary | ICD-10-CM

## 2018-12-03 DIAGNOSIS — J449 Chronic obstructive pulmonary disease, unspecified: Secondary | ICD-10-CM | POA: Diagnosis not present

## 2018-12-03 MED ORDER — PREDNISONE 50 MG PO TABS: ORAL_TABLET | ORAL | 0 refills | Status: DC

## 2018-12-03 MED ORDER — METOPROLOL TARTRATE 50 MG PO TABS: ORAL_TABLET | ORAL | 0 refills | Status: DC

## 2018-12-03 MED ORDER — DIPHENHYDRAMINE HCL 50 MG PO TABS: ORAL_TABLET | ORAL | 0 refills | Status: DC
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
## 2018-12-03 NOTE — Patient Instructions (Signed)
Medication Instructions:  Your physician recommends that you continue on your current medications as directed. Please refer to the Current Medication list given to you today.  If you need a refill on your cardiac medications before your next appointment, please call your pharmacy.   Lab work: Your physician recommends that you return for lab work one week before coronary CT    If you have labs (blood work) drawn today and your tests are completely normal, you will receive your results only by: Marland Kitchen MyChart Message (if you have MyChart) OR . A paper copy in the mail If you have any lab test that is abnormal or we need to change your treatment, we will call you to review the results.  Testing/Procedures: Coronary CT   Follow-Up: At [facility], you and your health needs are our priority.  As part of our continuing mission to provide you with exceptional heart care, we have created designated Provider Care Teams.  These Care Teams include your primary Cardiologist (physician) and Advanced Practice Providers (APPs -  Physician Assistants and Nurse Practitioners) who all work together to provide you with the care you need, when you need it. You will need a follow up appointment in 3-4 months.  Please call our office 2 months in advance to schedule this appointment.  You may see No primary care provider on file. or one of the following Advanced Practice Providers on your designated Care Team:   [place], PA-C [facility]) . Ermalinda Barrios, PA-C ([place])  Any Other Special Instructions Will Be Listed Below (If Applicable). Thank you for choosing [facility]!  Please arrive at the [facility] main entrance of [facility] at xx:xx AM (30-45 minutes prior to test start time)  [facility] [place], [place] [address] [phone]  Proceed to the [facility] Radiology Department (First Floor).  Please follow these instructions  carefully (unless otherwise directed):  Hold all erectile dysfunction medications at least 48 hours prior to test.  On the Night Before the Test: . Be sure to Drink plenty of water. . Do not consume any caffeinated/decaffeinated beverages or chocolate 12 hours prior to your test. . Do not take any antihistamines 12 hours prior to your test. . If you take Metformin do not take 24 hours prior to test. . If the patient has contrast allergy: ? Patient will need a prescription for Prednisone and very clear instructions (as follows): 1. Prednisone 50 mg - take 13 hours prior to test 2. Take another Prednisone 50 mg 7 hours prior to test 3. Take another Prednisone 50 mg 1 hour prior to test 4. Take Benadryl 50 mg 1 hour prior to test . Patient must complete all four doses of above prophylactic medications. . Patient will need a ride after test due to Benadryl.  On the Day of the Test: . Drink plenty of water. Do not drink any water within one hour of the test. . Do not eat any food 4 hours prior to the test. . You may take your regular medications prior to the test.  . Take metoprolol (Lopressor) two hours prior to test. . HOLD Furosemide/Hydrochlorothiazide morning of the test.   *For Clinical Staff only. Please instruct patient the following:*        -Drink plenty of water       -Hold Furosemide/hydrochlorothiazide morning of the test       -Take metoprolol (Lopressor) 2 hours prior to test (if  applicable).                  -If HR is less than 55 BPM- No Beta Blocker                -IF HR is greater than 55 BPM and patient is less than or equal to 77 yrs old Lopressor 100mg  x1.                -If HR is greater than 55 BPM and patient is greater than 77 yrs old Lopressor 50 mg x1.     Do not give Lopressor to patients with an allergy to lopressor or anyone with asthma or active COPD symptoms (currently taking steroids).       After the Test: . Drink plenty of water. . After  receiving IV contrast, you may experience a mild flushed feeling. This is normal. . On occasion, you may experience a mild rash up to 24 hours after the test. This is not dangerous. If this occurs, you can take Benadryl 25 mg and increase your fluid intake. . If you experience trouble breathing, this can be serious. If it is severe call 911 IMMEDIATELY. If it is mild, please call our office. . If you take any of these medications: Glipizide/Metformin, Avandament, Glucavance, please do not take 48 hours after completing test.

## 2018-12-08 ENCOUNTER — Encounter: Payer: Self-pay | Admitting: Gastroenterology

## 2018-12-15 ENCOUNTER — Other Ambulatory Visit: Payer: Self-pay | Admitting: *Deleted

## 2018-12-15 MED ORDER — FLUTICASONE FUROATE-VILANTEROL 100-25 MCG/INH IN AEPB: 1.0000 | INHALATION_SPRAY | Freq: Every day | RESPIRATORY_TRACT | 11 refills | Status: DC

## 2018-12-24 ENCOUNTER — Other Ambulatory Visit: Payer: Self-pay | Admitting: *Deleted

## 2018-12-24 MED ORDER — TAMSULOSIN HCL 0.4 MG PO CAPS: 0.8000 mg | ORAL_CAPSULE | Freq: Every day | ORAL | 2 refills | Status: DC

## 2019-01-06 ENCOUNTER — Encounter: Payer: Self-pay | Admitting: Gastroenterology

## 2019-01-22 ENCOUNTER — Other Ambulatory Visit: Payer: Self-pay

## 2019-01-22 ENCOUNTER — Ambulatory Visit (INDEPENDENT_AMBULATORY_CARE_PROVIDER_SITE_OTHER): Payer: Medicare Other | Admitting: Family Medicine

## 2019-01-22 ENCOUNTER — Encounter: Payer: Self-pay | Admitting: Family Medicine

## 2019-01-22 DIAGNOSIS — J410 Simple chronic bronchitis: Secondary | ICD-10-CM

## 2019-01-22 DIAGNOSIS — I2510 Atherosclerotic heart disease of native coronary artery without angina pectoris: Secondary | ICD-10-CM

## 2019-01-22 DIAGNOSIS — I700 Atherosclerosis of aorta: Secondary | ICD-10-CM

## 2019-01-22 DIAGNOSIS — L989 Disorder of the skin and subcutaneous tissue, unspecified: Secondary | ICD-10-CM | POA: Diagnosis not present

## 2019-01-22 DIAGNOSIS — I5032 Chronic diastolic (congestive) heart failure: Secondary | ICD-10-CM | POA: Diagnosis not present

## 2019-01-22 DIAGNOSIS — E119 Type 2 diabetes mellitus without complications: Secondary | ICD-10-CM

## 2019-01-22 NOTE — Progress Notes (Signed)
Virtual Visit via Telephone Note  I connected with Benjamin [name] on 01/22/19 at 11:56am by telephone and verified that I am speaking with the correct person using two identifiers.     Pt location: at home   Physician location:  At Home, [place], Vic Blackbird MD     On call: patient and physician   I discussed the limitations, risks, security and privacy concerns of performing an evaluation and management service by telephone and the availability of in person appointments. I also discussed with the patient that there may be a patient responsible charge related to this service. The patient expressed understanding and agreed to proceed.   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- last LDL 138 in Jan  started on crestor 5mg  but has leg pains so stopped   He has a lesion on his left  thigh, for past  3 months, no pain in area, no increase in size, he tried antibiotics ointment   Observations/Objective: Telephone visit- NAD over phone  Assessment and Plan: DM- repeat fasting labs/A1C in a few weeks, advised tocheck CBG fasting at home at least twice a a week  Skin lesion- concern for underlying malignancy since he has non healing lesion plan for biopsy in office  COPD-doing well with breo  CHF- no longer on lasix, but cardiology planning for CT Angiography,   CAD off crestor due to cramping  Follow Up Instructions: few weeks for OV, biopsy, labs    I discussed the assessment and treatment plan with the  patient. The patient was provided an opportunity to ask questions and all were answered. The patient agreed with the plan and demonstrated an understanding of the instructions.   The patient was advised to call back or seek an in-person evaluation if the symptoms worsen or if the condition fails to improve as anticipated.  I provided 11 minutes of non-face-to-face time during this encounter. Including chart review End time: 12:05pm  Vic Blackbird, MD

## 2019-01-26 ENCOUNTER — Other Ambulatory Visit (HOSPITAL_COMMUNITY)
Admission: RE | Admit: 2019-01-26 | Discharge: 2019-01-26 | Disposition: A | Discharge status: Home / Self Care | Payer: Medicare Other | Source: Ambulatory Visit | Attending: Cardiovascular Disease | Admitting: Cardiovascular Disease

## 2019-01-26 ENCOUNTER — Telehealth: Payer: Self-pay

## 2019-01-26 DIAGNOSIS — I2510 Atherosclerotic heart disease of native coronary artery without angina pectoris: Secondary | ICD-10-CM | POA: Diagnosis not present

## 2019-01-26 DIAGNOSIS — R0609 Other forms of dyspnea: Secondary | ICD-10-CM | POA: Diagnosis not present

## 2019-01-26 LAB — BASIC METABOLIC PANEL
Anion gap: 8 (ref 5–15)
BUN: 21 mg/dL (ref 8–23)
CO2: 25 mmol/L (ref 22–32)
Calcium: 9.2 mg/dL (ref 8.9–10.3)
Chloride: 108 mmol/L (ref 98–111)
Creatinine, Ser: 1.14 mg/dL (ref 0.61–1.24)
GFR calc Af Amer: >60 mL/min (ref >60)
GFR calc non Af Amer: >60 mL/min (ref >60)
Glucose, Bld: 123 mg/dL — ABNORMAL HIGH (ref 70–99)
Potassium: 3.9 mmol/L (ref 3.5–5.1)
Sodium: 141 mmol/L (ref 135–145)

## 2019-01-26 MED ORDER — PREDNISONE 50 MG PO TABS: ORAL_TABLET | ORAL | 0 refills | Status: DC

## 2019-01-26 NOTE — Telephone Encounter (Signed)
Take Prednisone 50 mg at 1130 pm on Wednesday night  01/27/19  Take Prednisone 50 mg at 730 am Thursday morning   01/28/19  Take Prednisone 50 mg at 1130 am Thursday morning  01/28/19     Take Lopressor 50 mg  2 hours before test  1030 am Thursday      Take Benadryl 50 mg 1 hour prior to test  1130 am Thursday 5/14     Get lab work (BMET today) I faxed lab slip to [facility] at [facility]    I spoke with both patient and wife, and they verbalized understanding of all prep prior to CT

## 2019-01-27 ENCOUNTER — Telehealth (HOSPITAL_COMMUNITY): Payer: Self-pay | Admitting: Emergency Medicine

## 2019-01-27 NOTE — Telephone Encounter (Signed)
Reaching out to patient to offer assistance regarding upcoming cardiac imaging study; pt verbalizes understanding of appt date/time, parking situation and where to check in, pre-test NPO status and medications ordered, and verified current allergies; name and call back number provided for further questions should they arise Marchia Bond RN [facility] [phone] office [phone] cell  Pt denies covid19 symptoms, verbalized understanding of visitor policy Reviewed allergy prophylaxis medication schedule with patient, verbalized understanding Instructed patient to check HR prior to taking beta blocker (will hold if HR 65bpm or less)

## 2019-01-27 NOTE — Telephone Encounter (Signed)
error 

## 2019-01-28 ENCOUNTER — Ambulatory Visit (HOSPITAL_COMMUNITY)
Admission: RE | Admit: 2019-01-28 | Discharge: 2019-01-28 | Disposition: A | Discharge status: Home / Self Care | Payer: Medicare Other | Source: Ambulatory Visit | Attending: Cardiovascular Disease | Admitting: Cardiovascular Disease

## 2019-01-28 ENCOUNTER — Other Ambulatory Visit: Payer: Self-pay

## 2019-01-28 DIAGNOSIS — I2510 Atherosclerotic heart disease of native coronary artery without angina pectoris: Secondary | ICD-10-CM | POA: Insufficient documentation

## 2019-01-28 MED ORDER — NITROGLYCERIN 0.4 MG SL SUBL
0.8000 mg | SUBLINGUAL_TABLET | SUBLINGUAL | Status: DC | PRN
  Administered 2019-01-28: 0.8 mg via SUBLINGUAL
  Filled 2019-01-28: qty 25

## 2019-01-28 MED ORDER — IOHEXOL 350 MG/ML SOLN
100.0000 mL | Freq: Once | INTRAVENOUS | Status: AC | PRN
  Administered 2019-01-28: 80 mL via INTRAVENOUS

## 2019-01-28 MED ORDER — NITROGLYCERIN 0.4 MG SL SUBL
SUBLINGUAL_TABLET | SUBLINGUAL | Status: AC
  Filled 2019-01-28: qty 2

## 2019-01-29 ENCOUNTER — Telehealth: Payer: Self-pay

## 2019-01-29 MED ORDER — PRAVASTATIN SODIUM 20 MG PO TABS: 20.0000 mg | ORAL_TABLET | Freq: Every evening | ORAL | 3 refills | Status: DC

## 2019-01-29 MED ORDER — ASPIRIN EC 81 MG PO TBEC: 81.0000 mg | DELAYED_RELEASE_TABLET | Freq: Every day | ORAL | 3 refills | Status: DC

## 2019-01-29 NOTE — Telephone Encounter (Signed)
Patient cannot take crestor, severe joint aches

## 2019-01-29 NOTE — Telephone Encounter (Signed)
E-scribed pravastatin to [facility], pt will call for problems

## 2019-01-29 NOTE — Telephone Encounter (Signed)
-----   Message from Herminio Commons, MD sent at 01/28/2019  4:14 PM EDT ----- He appears to have fairly significant CAD. Further analysis is being performed on the images. In the meantime, I would recommend starting ASA 81 mg daily and rosuvastatin 20 mg daily.

## 2019-01-29 NOTE — Telephone Encounter (Signed)
I would try pravastatin 20 mg daily to start.

## 2019-01-29 NOTE — Telephone Encounter (Signed)
He appears to have a significant blockage in the prox-mid LAD. While he has exertional dyspnea which can also be attributed to COPD, I would recommend coronary angiography. Would make a telehealth visit next week with APP to discuss further.    Telephone apt (his phone is blocked from texts) with Benjamin Ransom PA-C on 02/04/19 at 3 pm       Virtual Visit Pre-Appointment Phone Call  "(Name), I am calling you today to discuss your upcoming appointment. We are currently trying to limit exposure to the virus that causes COVID-19 by seeing patients at home rather than in the office."  1. "What is the BEST phone number to call the day of the visit?" - include this in appointment notes  2. "Do you have or have access to (through a family member/friend) a smartphone with video capability that we can use for your visit?" a. If yes - list this number in appt notes as "cell" (if different from BEST phone #) and list the appointment type as a VIDEO visit in appointment notes b. If no - list the appointment type as a PHONE visit in appointment notes  3. Confirm consent - "In the setting of the current Covid19 crisis, you are scheduled for a (phone or video) visit with your provider on (date) at (time).  Just as we do with many in-office visits, in order for you to participate in this visit, we must obtain consent.  If you'd like, I can send this to your mychart (if signed up) or email for you to review.  Otherwise, I can obtain your verbal consent now.  All virtual visits are billed to your insurance company just like a normal visit would be.  By agreeing to a virtual visit, we'd like you to understand that the technology does not allow for your provider to perform an examination, and thus may limit your provider's ability to fully assess your condition. If your provider identifies any concerns that need to be evaluated in person, we will make arrangements to do so.  Finally, though the technology is pretty  good, we cannot assure that it will always work on either your or our end, and in the setting of a video visit, we may have to convert it to a phone-only visit.  In either situation, we cannot ensure that we have a secure connection.  Are you willing to proceed?" STAFF: Did the patient verbally acknowledge consent to telehealth visit? Document YES/NO here: YES  4. Advise patient to be prepared - "Two hours prior to your appointment, go ahead and check your blood pressure, pulse, oxygen saturation, and your weight (if you have the equipment to check those) and write them all down. When your visit starts, your provider will ask you for this information. If you have an Apple Watch or Kardia device, please plan to have heart rate information ready on the day of your appointment. Please have a pen and paper handy nearby the day of the visit as well."  5. Give patient instructions for MyChart download to smartphone OR Doximity/Doxy.me as below if video visit (depending on what platform provider is using)  6. Inform patient they will receive a phone call 15 minutes prior to their appointment time (may be from unknown caller ID) so they should be prepared to answer    TELEPHONE CALL NOTE  Bruno Leach has been deemed a candidate for a follow-up tele-health visit to limit community exposure during the Covid-19 pandemic. I spoke  with the patient via phone to ensure availability of phone/video source, confirm preferred email & phone number, and discuss instructions and expectations.  I reminded Benjamin [name] to be prepared with any vital sign and/or heart rhythm information that could potentially be obtained via home monitoring, at the time of his visit. I reminded Benjamin [name] to expect a phone call prior to his visit.  Bernita Raisin, RN 01/29/2019 3:44 PM   INSTRUCTIONS FOR DOWNLOADING THE MYCHART APP TO SMARTPHONE  - The patient must first make sure to have activated MyChart and know their login  information - If Apple, go to [employer] and type in MyChart in the search bar and download the app. If Android, ask patient to go to [employer] and type in [place] in the search bar and download the app. The app is free but as with any other app downloads, their phone may require them to verify saved payment information or Apple/Android password.  - The patient will need to then log into the app with their MyChart username and password, and select [facility] as their healthcare provider to link the account. When it is time for your visit, go to the MyChart app, find appointments, and click Begin Video Visit. Be sure to Select Allow for your device to access the Microphone and Camera for your visit. You will then be connected, and your provider will be with you shortly.  **If they have any issues connecting, or need assistance please contact MyChart service desk [phone])**  **If using a computer, in order to ensure the best quality for their visit they will need to use either of the following Internet Browsers: [employer], or Google Chrome**  IF USING DOXIMITY or DOXY.ME - The patient will receive a link just prior to their visit by text.     FULL LENGTH CONSENT FOR TELE-HEALTH VISIT   I hereby voluntarily request, consent and authorize [facility] and its employed or contracted physicians, physician assistants, nurse practitioners or other licensed health care professionals (the Practitioner), to provide me with telemedicine health care services (the "Services") as deemed necessary by the treating Practitioner. I acknowledge and consent to receive the Services by the Practitioner via telemedicine. I understand that the telemedicine visit will involve communicating with the Practitioner through live audiovisual communication technology and the disclosure of certain medical information by electronic transmission. I acknowledge that I have been given the opportunity to  request an in-person assessment or other available alternative prior to the telemedicine visit and am voluntarily participating in the telemedicine visit.  I understand that I have the right to withhold or withdraw my consent to the use of telemedicine in the course of my care at any time, without affecting my right to future care or treatment, and that the Practitioner or I may terminate the telemedicine visit at any time. I understand that I have the right to inspect all information obtained and/or recorded in the course of the telemedicine visit and may receive copies of available information for a reasonable fee.  I understand that some of the potential risks of receiving the Services via telemedicine include:  Marland Kitchen Delay or interruption in medical evaluation due to technological equipment failure or disruption; . Information transmitted may not be sufficient (e.g. poor resolution of images) to allow for appropriate medical decision making by the Practitioner; and/or  . In rare instances, security protocols could fail, causing a breach of personal health information.  Furthermore, I acknowledge that it  is my responsibility to provide information about my medical history, conditions and care that is complete and accurate to the best of my ability. I acknowledge that Practitioner's advice, recommendations, and/or decision may be based on factors not within their control, such as incomplete or inaccurate data provided by me or distortions of diagnostic images or specimens that may result from electronic transmissions. I understand that the practice of medicine is not an exact science and that Practitioner makes no warranties or guarantees regarding treatment outcomes. I acknowledge that I will receive a copy of this consent concurrently upon execution via email to the email address I last provided but may also request a printed copy by calling the office of [facility].    I understand that my insurance  will be billed for this visit.   I have read or had this consent read to me. . I understand the contents of this consent, which adequately explains the benefits and risks of the Services being provided via telemedicine.  . I have been provided ample opportunity to ask questions regarding this consent and the Services and have had my questions answered to my satisfaction. . I give my informed consent for the services to be provided through the use of telemedicine in my medical care  By participating in this telemedicine visit I agree to the above.

## 2019-01-29 NOTE — Telephone Encounter (Signed)
What statins has he tried before?

## 2019-01-29 NOTE — Addendum Note (Signed)
Addended by: Barbarann Ehlers A on: 01/29/2019 11:52 AM   Modules accepted: Orders

## 2019-01-29 NOTE — Telephone Encounter (Signed)
Crestor is the only statin he has ever taken

## 2019-02-03 ENCOUNTER — Ambulatory Visit (INDEPENDENT_AMBULATORY_CARE_PROVIDER_SITE_OTHER): Payer: Medicare Other | Admitting: Family Medicine

## 2019-02-03 ENCOUNTER — Other Ambulatory Visit: Payer: Self-pay

## 2019-02-03 VITALS — BP 120/72 | HR 90 | Temp 97.6°F | Resp 16 | Ht 71.5 in | Wt 188.2 lb

## 2019-02-03 DIAGNOSIS — R233 Spontaneous ecchymoses: Secondary | ICD-10-CM | POA: Diagnosis not present

## 2019-02-03 DIAGNOSIS — R21 Rash and other nonspecific skin eruption: Secondary | ICD-10-CM

## 2019-02-03 DIAGNOSIS — I700 Atherosclerosis of aorta: Secondary | ICD-10-CM | POA: Diagnosis not present

## 2019-02-03 DIAGNOSIS — E119 Type 2 diabetes mellitus without complications: Secondary | ICD-10-CM

## 2019-02-03 MED ORDER — HYDROXYZINE HCL 10 MG PO TABS: 10.0000 mg | ORAL_TABLET | Freq: Three times a day (TID) | ORAL | 0 refills | Status: DC | PRN

## 2019-02-03 NOTE — Progress Notes (Addendum)
Patient ID: Benjamin [name], male    DOB: March 09, 1941, 78 y.o.   MRN: [id number]  PCP: Alycia Rossetti, MD  Chief Complaint  Patient presents with  . Rash    all over, started x2 days, itching, lotion on back    Subjective:   Benjamin [name] is a 78 y.o. male, presents to clinic with CC of itchy spreading rash.  Rash is mostly flat, little red spots, , started 3 days ago, is located all over his body more concentrated to his groin and skin fold areas but also scattered all over his torso and he also says that the liver has had although I do not see any there.  3 days ago, his wife is applied one lotion to his back to try and treat the itching but it did not help.  Not tried any other medications to stop the itching.  The rash has continued to spread.  He states there are no changes to lotions, soaps, detergents.  He states that his cholesterol medication was changed 5 days ago and he started a new pill because the other one was causing body aches.  He denies any contact with poison oak poison ivy, denies any bug bites or tick bites  no other sx, no fever, chills, sweats, myalgias, URI sx, urinary sx, fever, fatigue.  Severe itching.  Wife applied calamine lotion, but no meds taken.  Patient does not want any steroids or prednisone, he has not tried any antihistamines.  Also want other routine labs done today as he is fasting -and his PCP has reportedly ordered his other routine labs and A1c which they requested he do today while he is here.  His wife who is with him does not have any similar rash, no known sick contacts. Never had anything like this.  Patient Active Problem List   Diagnosis Date Noted  . Aortic atherosclerosis ([place]) 09/22/2018  . Type 2 diabetes mellitus without complications ([place]) 68/34/1962  . Chronic diastolic CHF (congestive heart failure) ([place])   . Right lower quadrant abdominal pain   . Irritable bowel syndrome with diarrhea   . Bacteremia due to Escherichia coli   .  Urine retention   . Glucose intolerance   . Palliative care by specialist   . Elevated troponin   . Coronary artery calcification seen on CAT scan   . Sepsis secondary to UTI ([place]) 05/06/2017  . Chronic bilateral thoracic back pain 02/12/2017  . GERD (gastroesophageal reflux disease) 11/27/2016  . Constipation 10/28/2016  . Erectile dysfunction 06/25/2016  . Chronic diarrhea 06/07/2016  . Duodenal ulcer 02/19/2016  . COPD (chronic obstructive pulmonary disease) ([place]) 10/20/2015  . DDD (degenerative disc disease), lumbar 01/11/2014  . Borderline diabetes 02/18/2013  . Hip pain 11/13/2012  . BPH (benign prostatic hyperplasia) 03/09/2012  . Cataract 03/09/2012     Prior to Admission medications   Medication Sig Start Date End Date Taking? Authorizing Provider  acetaminophen (TYLENOL) 325 MG tablet Take 650 mg by mouth every 6 (six) hours as needed for mild pain, moderate pain, fever or headache.    Yes [provider]  albuterol (PROVENTIL) (2.5 MG/3ML) 0.083% nebulizer solution Take 3 mLs (2.5 mg total) by nebulization every 6 (six) hours as needed for wheezing or shortness of breath. 11/06/18  Yes Humnoke, Modena Nunnery, MD  aspirin EC 81 MG tablet Take 1 tablet (81 mg total) by mouth daily. 01/29/19  Yes Herminio Commons, MD  colestipol (COLESTID) 1 g tablet Take  1 g by mouth 2 (two) times daily.   Yes [provider]  fluticasone furoate-vilanterol (BREO ELLIPTA) 100-25 MCG/INH AEPB Inhale 1 puff into the lungs daily. For breathing 12/15/18  Yes [place], Modena Nunnery, MD  loperamide (IMODIUM) 2 MG capsule Take by mouth as needed for diarrhea or loose stools.   Yes [provider]  pravastatin (PRAVACHOL) 20 MG tablet Take 1 tablet (20 mg total) by mouth every evening. 01/29/19 04/29/19 Yes Herminio Commons, MD  RABEprazole (ACIPHEX) 20 MG tablet Take 1 tablet (20 mg total) by mouth daily. Patient taking differently: Take 20 mg by mouth daily.  02/03/17  Yes Annitta Needs, NP  RABEprazole (ACIPHEX) 20 MG tablet Take 20 mg by mouth daily.   Yes [provider]  tamsulosin (FLOMAX) 0.4 MG CAPS capsule Take 2 capsules (0.8 mg total) by mouth daily after supper. For prostate 12/24/18  Yes [place], Modena Nunnery, MD  predniSONE (DELTASONE) 50 MG tablet Prednisone 50 mg -take 13 hours prior to test Take another Prednisone 50 mg 7 hours prior to test Take another Prednisone 50 mg 1 hour prior to test Patient not taking: Reported on 02/03/2019 01/26/19   Herminio Commons, MD     Allergies  Allergen Reactions  . Arsenic Swelling    Severe swelling if patient comes in contact   . Contrast Media [Iodinated Diagnostic Agents]   . Crestor [Rosuvastatin Calcium]      Family History  Problem Relation Age of Onset  . Diabetes Mother   . COPD Mother   . Heart disease Mother   . Colon cancer Neg Hx      Social History   Socioeconomic History  . Marital status: Married    Spouse name: Not on file  . Number of children: Not on file  . Years of education: Not on file  . Highest education level: Not on file  Occupational History  . Occupation: retired  Scientific laboratory technician  . Financial resource strain: Not on file  . Food insecurity:    Worry: Not on file    Inability: Not on file  . Transportation needs:    Medical: Not on file    Non-medical: Not on file  Tobacco Use  . Smoking status: Former Smoker    Packs/day: 0.50    Years: 50.00    Pack years: 25.00    Types: Cigarettes    Last attempt to quit: 02/15/2015    Years since quitting: 3.9  . Smokeless tobacco: Never Used  . Tobacco comment: Quit x 1 year  Substance and Sexual Activity  . Alcohol use: Yes    Alcohol/week: 0.0 standard drinks    Comment: seldom, social   . Drug use: No  . Sexual activity: Yes  Lifestyle  . Physical activity:    Days per week: Not on file    Minutes per session: Not on file  . Stress: Not on file  Relationships  . Social connections:    Talks on phone: Not  on file    Gets together: Not on file    Attends religious service: Not on file    Active member of club or organization: Not on file    Attends meetings of clubs or organizations: Not on file    Relationship status: Not on file  . Intimate partner violence:    Fear of current or ex partner: Not on file    Emotionally abused: Not on file    Physically abused:  Not on file    Forced sexual activity: Not on file  Other Topics Concern  . Not on file  Social History Narrative  . Not on file     Review of Systems  Constitutional: Negative.   HENT: Negative.   Eyes: Negative.   Respiratory: Negative.   Cardiovascular: Negative.   Gastrointestinal: Negative.   Endocrine: Negative.   Genitourinary: Negative.   Musculoskeletal: Negative.   Skin: Positive for rash. Negative for wound.  Allergic/Immunologic: Negative.   Neurological: Negative.   Hematological: Negative.   Psychiatric/Behavioral: Negative.   All other systems reviewed and are negative.      Objective:    Vitals:   02/03/19 1125  BP: 120/72  Pulse: 90  Resp: 16  Temp: 97.6 F (36.4 C)  TempSrc: Oral  SpO2: 95%  Weight: 188 lb 3.2 oz (85.4 kg)  Height: 5' 11.5" (1.816 m)    Note - initial HR was 116, but recheck during exam and was WNL as noted above  Physical Exam Vitals signs and nursing note reviewed.  Constitutional:      General: He is not in acute distress.    Appearance: He is well-developed. He is not ill-appearing, toxic-appearing or diaphoretic.     Comments: Well appearing, spirited elderly male, very talkative and scratching himself often  HENT:     Head: Normocephalic and atraumatic.     Right Ear: External ear normal.     Left Ear: External ear normal.     Nose: Nose normal. No congestion or rhinorrhea.     Mouth/Throat:     Mouth: Mucous membranes are moist.     Pharynx: Oropharynx is clear. No oropharyngeal exudate.  Eyes:     General: No scleral icterus.       Right eye: No  discharge.        Left eye: No discharge.     Conjunctiva/sclera: Conjunctivae normal.  Neck:     Musculoskeletal: Normal range of motion. No neck rigidity.     Trachea: No tracheal deviation.  Cardiovascular:     Rate and Rhythm: Normal rate and regular rhythm.     Pulses: Normal pulses.     Heart sounds: Normal heart sounds.  Pulmonary:     Effort: Pulmonary effort is normal. No respiratory distress.     Breath sounds: Normal breath sounds. No stridor. No wheezing, rhonchi or rales.  Abdominal:     General: Bowel sounds are normal. There is no distension.     Tenderness: There is no abdominal tenderness.  Musculoskeletal: Normal range of motion.        General: No swelling.  Lymphadenopathy:     Cervical: No cervical adenopathy.  Skin:    General: Skin is warm and dry.     Capillary Refill: Capillary refill takes less than 2 seconds.     Coloration: Skin is not jaundiced or pale.     Findings: Petechiae and rash present. No lesion.     Comments: Non-blanching diffuse petechial rash, more concentrated in skin fold in groin area, more scattered over chest, abdomen and back, none noted to extremities, neck, face or scalp  Neurological:     Mental Status: He is alert.     Motor: No abnormal muscle tone.     Coordination: Coordination normal.     Gait: Gait normal.  Psychiatric:        Mood and Affect: Mood normal.        Behavior: Behavior normal.  Assessment & Plan:      ICD-10-CM   1. Petechial rash C16.6 COMPLETE METABOLIC PANEL WITH GFR    Protime-INR    CBC with Differential/Platelet    APTT    Sedimentation Rate  2. Rash and nonspecific skin eruption R21   3. Type 2 diabetes mellitus without complication, without long-term current use of insulin (HCC) E11.9 Hemoglobin A1c    Lipid panel  4. Aortic atherosclerosis (HCC) I70.0 Lipid panel    Patient presents with a pruritic petechial appearing rash that has diffuse distribution to his trunk, he notes  that it severely itchy and spreading rapidly about 2 to 3 days of symptoms, no known cause except for he did have medication change 5 days ago, when he started pravastatin.  I will obtain some lab work to rule out vasculitis, unusual presentation for allergic reaction but I will have him hold the pravastatin for now.  Because it appears petechial will obtain clotting factors check his platelets and CBC, check his LFTs  He request his other routine labs that are already ordered to be completed today.  He refuses any steroids, the diffuse distribution I do not believe that anything topical would be very effective at this point so will do a trial of hydroxyzine explained the dosing to him.    Patient also wanted to discuss his upcoming cardiac procedure, wanted to review all of his medication and pulled out every single bottle asking why it does not say what each medicine is for, looked at his medications with him reviewed who prescribed them and reviewed what they were for which he basically already knew.    I have explained that he will still need to follow up with his PCP about his other chronic issues, and address medication concerns in the future with the prescribers.  Delsa Grana, PA-C 02/03/19 11:39 AM

## 2019-02-03 NOTE — Patient Instructions (Signed)
Hold the pravastatin until I get your labs  Take hydroxyzine 1 to 2 tabs up to three times a day as needed for itching

## 2019-02-04 ENCOUNTER — Encounter: Payer: Self-pay | Admitting: Family Medicine

## 2019-02-04 ENCOUNTER — Other Ambulatory Visit: Payer: Self-pay

## 2019-02-04 ENCOUNTER — Ambulatory Visit (INDEPENDENT_AMBULATORY_CARE_PROVIDER_SITE_OTHER): Payer: Medicare Other | Admitting: Family Medicine

## 2019-02-04 ENCOUNTER — Telehealth (INDEPENDENT_AMBULATORY_CARE_PROVIDER_SITE_OTHER): Payer: Medicare Other | Admitting: Cardiology

## 2019-02-04 VITALS — BP 100/60 | HR 114 | Temp 97.9°F | Resp 20 | Ht 71.5 in | Wt 188.0 lb

## 2019-02-04 DIAGNOSIS — L270 Generalized skin eruption due to drugs and medicaments taken internally: Secondary | ICD-10-CM | POA: Diagnosis not present

## 2019-02-04 DIAGNOSIS — I2510 Atherosclerotic heart disease of native coronary artery without angina pectoris: Secondary | ICD-10-CM

## 2019-02-04 LAB — CBC WITH DIFFERENTIAL/PLATELET
Absolute Monocytes: 591 cells/uL (ref 200–950)
Basophils Absolute: 88 cells/uL (ref 0–200)
Basophils Relative: 1.2 %
Eosinophils Absolute: 299 cells/uL (ref 15–500)
Eosinophils Relative: 4.1 %
HCT: 50.8 % — ABNORMAL HIGH (ref 38.5–50.0)
Hemoglobin: 17.3 g/dL — ABNORMAL HIGH (ref 13.2–17.1)
Lymphs Abs: 1489 cells/uL (ref 850–3900)
MCH: 31.6 pg (ref 27.0–33.0)
MCHC: 34.1 g/dL (ref 32.0–36.0)
MCV: 92.7 fL (ref 80.0–100.0)
MPV: 9.7 fL (ref 7.5–12.5)
Monocytes Relative: 8.1 %
Neutro Abs: 4833 cells/uL (ref 1500–7800)
Neutrophils Relative %: 66.2 %
Platelets: 289 10*3/uL (ref 140–400)
RBC: 5.48 10*6/uL (ref 4.20–5.80)
RDW: 13 % (ref 11.0–15.0)
Total Lymphocyte: 20.4 %
WBC: 7.3 10*3/uL (ref 3.8–10.8)

## 2019-02-04 LAB — COMPLETE METABOLIC PANEL WITH GFR
AG Ratio: 1.8 (calc) (ref 1.0–2.5)
ALT: 26 U/L (ref 9–46)
AST: 22 U/L (ref 10–35)
Albumin: 4.3 g/dL (ref 3.6–5.1)
Alkaline phosphatase (APISO): 51 U/L (ref 35–144)
BUN/Creatinine Ratio: 13 (calc) (ref 6–22)
BUN: 17 mg/dL (ref 7–25)
CO2: 19 mmol/L — ABNORMAL LOW (ref 20–32)
Calcium: 9.5 mg/dL (ref 8.6–10.3)
Chloride: 106 mmol/L (ref 98–110)
Creat: 1.26 mg/dL — ABNORMAL HIGH (ref 0.70–1.18)
GFR, Est African American: 64 mL/min/{1.73_m2} (ref >=60)
GFR, Est Non African American: 55 mL/min/{1.73_m2} — ABNORMAL LOW (ref >=60)
Globulin: 2.4 g/dL (calc) (ref 1.9–3.7)
Glucose, Bld: 111 mg/dL — ABNORMAL HIGH (ref 65–99)
Potassium: 4.1 mmol/L (ref 3.5–5.3)
Sodium: 141 mmol/L (ref 135–146)
Total Bilirubin: 1.8 mg/dL — ABNORMAL HIGH (ref 0.2–1.2)
Total Protein: 6.7 g/dL (ref 6.1–8.1)

## 2019-02-04 LAB — LIPID PANEL
Cholesterol: 140 mg/dL (ref <200)
HDL: 40 mg/dL (ref >=40)
LDL Cholesterol (Calc): 83 mg/dL (calc)
Non-HDL Cholesterol (Calc): 100 mg/dL (calc) (ref <130)
Total CHOL/HDL Ratio: 3.5 (calc) (ref <5.0)
Triglycerides: 76 mg/dL (ref <150)

## 2019-02-04 LAB — PROTIME-INR
INR: 1
Prothrombin Time: 10.5 s (ref 9.0–11.5)

## 2019-02-04 LAB — HEMOGLOBIN A1C
Hgb A1c MFr Bld: 6 % of total Hgb — ABNORMAL HIGH (ref <5.7)
Mean Plasma Glucose: 126 (calc)
eAG (mmol/L): 7 (calc)

## 2019-02-04 LAB — SEDIMENTATION RATE: Sed Rate: 2 mm/h (ref 0–20)

## 2019-02-04 LAB — APTT: aPTT: 27 s (ref 22–34)

## 2019-02-04 MED ORDER — METHYLPREDNISOLONE ACETATE 80 MG/ML IJ SUSP
80.0000 mg | Freq: Once | INTRAMUSCULAR | Status: AC
  Administered 2019-02-04: 80 mg via INTRAMUSCULAR

## 2019-02-04 MED ORDER — PREDNISONE 20 MG PO TABS: ORAL_TABLET | ORAL | 0 refills | Status: DC

## 2019-02-04 MED ORDER — LOPERAMIDE HCL 2 MG PO CAPS: 2.0000 mg | ORAL_CAPSULE | ORAL | 2 refills | Status: DC | PRN

## 2019-02-04 MED ORDER — COLESTIPOL HCL 1 G PO TABS: 1.0000 g | ORAL_TABLET | Freq: Two times a day (BID) | ORAL | 3 refills | Status: DC
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
## 2019-02-04 NOTE — Addendum Note (Signed)
Addended by: Shary Decamp B on: 02/04/2019 03:41 PM   Modules accepted: Orders

## 2019-02-05 ENCOUNTER — Encounter: Payer: Self-pay | Admitting: Cardiology

## 2019-02-05 ENCOUNTER — Telehealth (INDEPENDENT_AMBULATORY_CARE_PROVIDER_SITE_OTHER): Payer: Medicare Other | Admitting: Cardiology

## 2019-02-05 VITALS — BP 128/94 | HR 104 | Ht 71.0 in | Wt 185.0 lb

## 2019-02-05 DIAGNOSIS — J410 Simple chronic bronchitis: Secondary | ICD-10-CM | POA: Diagnosis not present

## 2019-02-05 DIAGNOSIS — E785 Hyperlipidemia, unspecified: Secondary | ICD-10-CM

## 2019-02-05 DIAGNOSIS — I2510 Atherosclerotic heart disease of native coronary artery without angina pectoris: Secondary | ICD-10-CM

## 2019-02-05 NOTE — Patient Instructions (Signed)
Your physician recommends that you schedule a follow-up appointment with DR Bronson Ing IN THE NEXT 3 WEEKS   Your physician recommends that you continue on your current medications as directed. Please refer to the Current Medication list given to you today.  Thank you for choosing [facility]!!
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
## 2019-02-05 NOTE — Progress Notes (Signed)
Cancelled.  

## 2019-02-09 ENCOUNTER — Encounter: Payer: Self-pay | Admitting: Family Medicine

## 2019-02-09 ENCOUNTER — Other Ambulatory Visit: Payer: Self-pay

## 2019-02-09 ENCOUNTER — Telehealth: Payer: Self-pay | Admitting: Cardiology

## 2019-02-09 ENCOUNTER — Ambulatory Visit (INDEPENDENT_AMBULATORY_CARE_PROVIDER_SITE_OTHER): Payer: Medicare Other | Admitting: Family Medicine

## 2019-02-09 VITALS — BP 120/82 | HR 90 | Temp 97.6°F | Resp 18 | Wt 187.0 lb

## 2019-02-09 DIAGNOSIS — L570 Actinic keratosis: Secondary | ICD-10-CM | POA: Diagnosis not present

## 2019-02-09 DIAGNOSIS — E119 Type 2 diabetes mellitus without complications: Secondary | ICD-10-CM | POA: Diagnosis not present

## 2019-02-09 DIAGNOSIS — E785 Hyperlipidemia, unspecified: Secondary | ICD-10-CM

## 2019-02-09 DIAGNOSIS — D229 Melanocytic nevi, unspecified: Secondary | ICD-10-CM

## 2019-02-09 DIAGNOSIS — I2510 Atherosclerotic heart disease of native coronary artery without angina pectoris: Secondary | ICD-10-CM

## 2019-02-09 NOTE — Progress Notes (Signed)
I will forward to Dr Raliegh Ip, did not receive message in box until late Friday afternoon and he is back in the office today    J Flem Enderle MD

## 2019-02-09 NOTE — Progress Notes (Signed)
   Subjective:    Patient ID: Benjamin [name], male    DOB: 20-Feb-1941, 78 y.o.   MRN: [id number]  Patient presents for Biopsy (skin, left thigh)  Pt here for biopsy of left thigh, see phone note, lesion present for months, not healing.  He states that he believes he was stung by a bee in the same area it often flakes and peels off but there is also a hard spot.  Is nontender nonpruritic Also seen last week for drug rash thought to be secondary to pravastatin the rash is almost 80% clear he still has itching he has 2 more days of prednisone he is also on hydroxyzine  He canceled his cardiac catheterization because he wants it to be done locally  Fasting labs reviewed- A1C 6%, Cr 1.26, lipids at goal  Review Of Systems:  GEN- denies fatigue, fever, weight loss,weakness, recent illness HEENT- denies eye drainage, change in vision, nasal discharge, CVS- denies chest pain, palpitations RESP- denies SOB, cough, wheeze ABD- denies N/V, change in stools, abd pain GU- denies dysuria, hematuria, dribbling, incontinence MSK- denies joint pain, muscle aches, injury Neuro- denies headache, dizziness, syncope, seizure activity       Objective:    BP 120/82   Pulse 90   Temp 97.6 F (36.4 C)   Resp 18   Wt 187 lb (84.8 kg)   SpO2 92%   BMI 26.08 kg/m  GEN- NAD, alert and oriented x3 HEENT- PERRL, EOMI, non injected sclera, pink conjunctiva, MMM, oropharynx clear Neck- Supple, CVS- RRR, no murmur RESP-CTAB Skin- mild erythema bilat legs and back, no papules noted, no blisters, no hives seen Left lower thigh- scaley erythematous lesion, NT dime size  EXT- No edema Pulses- Radial, DP- 2+  Procedure-  Punch Biopsy  Procedure explained to patient questions answered benefits and risks discussed verbal consent obtained. Antiseptic-betadine  Anesthesia-lidocaine 1% with Epi 1.5CC given iNTO left thigh 46mm punch biopsy performed Minimal blood loss,  Patient tolerated procedure well Bandage  applied Pathology sent         Assessment & Plan:      Problem List Items Addressed This Visit      Unprioritized   Dyslipidemia    Statin intolerant, awaiting Cardiac Cath May be good candidate for one of the PCSK9 inhibitors, defer to cardiology      Type 2 diabetes mellitus without complications ([place])    Diet controlled improved A1C with reduction of his sweets       Other Visit Diagnoses    Nevus, atypical    -  Primary   await pathology r/o malignancy, complete steroids for drug reaction   Relevant Orders   Pathology Report ([facility])      Note: This dictation was prepared with Dragon dictation along with smaller phrase technology. Any transcriptional errors that result from this process are unintentional.

## 2019-02-09 NOTE — Assessment & Plan Note (Signed)
Statin intolerant, awaiting Cardiac Cath May be good candidate for one of the PCSK9 inhibitors, defer to cardiology

## 2019-02-09 NOTE — Telephone Encounter (Signed)
Addendum: patient has contrast dye allergy also

## 2019-02-09 NOTE — Telephone Encounter (Signed)
Patient on prednisone now for some type of allergic reaction with hives last week.He doe want to proceed with heart cath.He has no c/o CP or DOE now.Should I wait to schedule cath when hives are completely resolved?

## 2019-02-09 NOTE — Assessment & Plan Note (Signed)
Diet controlled improved A1C with reduction of his sweets

## 2019-02-09 NOTE — Progress Notes (Signed)
I'll speak with him at office visit.

## 2019-02-09 NOTE — Telephone Encounter (Signed)
Sure

## 2019-02-09 NOTE — Patient Instructions (Signed)
F/U 4 months  

## 2019-02-09 NOTE — Telephone Encounter (Signed)
Patient states that he was talking to [facility] in regards to scheduling CAth. Please return call. / tg

## 2019-02-12 LAB — TISSUE SPECIMEN

## 2019-02-12 LAB — PATHOLOGY REPORT

## 2019-02-15 ENCOUNTER — Telehealth: Payer: Self-pay

## 2019-02-15 ENCOUNTER — Other Ambulatory Visit: Payer: Self-pay | Admitting: Cardiovascular Disease

## 2019-02-15 DIAGNOSIS — I2510 Atherosclerotic heart disease of native coronary artery without angina pectoris: Secondary | ICD-10-CM

## 2019-02-15 MED ORDER — PREDNISONE 50 MG PO TABS: ORAL_TABLET | ORAL | 0 refills | Status: DC

## 2019-02-15 MED ORDER — SODIUM CHLORIDE 0.9% FLUSH: 3.0000 mL | Freq: Two times a day (BID) | INTRAVENOUS | Status: DC

## 2019-02-15 NOTE — Telephone Encounter (Signed)
Cath scheduled for 02/19/19 at 730 am with Dr.Smith.Pt to have Covid testing tomorrow 02/16/2019 at the [facility] site at 1010 am    Pt has dye allergy, prednisone e-scribed to [facility]      Has f/u apt in July with Dr.Koneswaran

## 2019-02-16 ENCOUNTER — Other Ambulatory Visit (HOSPITAL_COMMUNITY)
Admission: RE | Admit: 2019-02-16 | Discharge: 2019-02-16 | Disposition: A | Discharge status: Home / Self Care | Payer: Medicare Other | Source: Ambulatory Visit | Attending: Interventional Cardiology | Admitting: Interventional Cardiology

## 2019-02-16 ENCOUNTER — Other Ambulatory Visit: Payer: Self-pay

## 2019-02-16 DIAGNOSIS — Z1159 Encounter for screening for other viral diseases: Secondary | ICD-10-CM | POA: Insufficient documentation

## 2019-02-17 LAB — NOVEL CORONAVIRUS, NAA (HOSP ORDER, SEND-OUT TO REF LAB; TAT 18-24 HRS): SARS-CoV-2, NAA: NOT DETECTED

## 2019-02-18 ENCOUNTER — Telehealth: Payer: Self-pay | Admitting: *Deleted

## 2019-02-18 NOTE — Telephone Encounter (Signed)
Pt contacted pre-catheterization scheduled at [facility] for: Friday February 19, 2019 7:30 AM Verified arrival time and place: [facility] En[facility] A at: 5:30 AM  Covid-19 test date: 02/16/19 not detected  No solid food after midnight prior to cath, clear liquids until 5 AM day of procedure.  Contrast allergy: yes -13 hour Prednisone and Benadryl Prep reviewed with patient: 02/18/19 6:30 PM Prednisone 50 mg 02/19/19 12:30 AM Prednisone 50 mg 02/19/19 AM just before  leaving for hospital Prednisone 50 mg and Bendaryl 50 mg Pt advised not to drive.   AM meds can be  taken pre-cath with sip of water including: ASA 81 mg Prednisone 50 mg Benadryl 50 mg  Confirmed patient has responsible person to drive home post procedure and observe 24 hours after arriving home: yes  Due to Covid-19 pandemic no visitors are allowed in the hospital (unless cognitive impairment).  Their designated party will be called when their procedure is over for an update and to arrange pick up.  Patients are required to wear a mask when they enter the hospital.       COVID-19 Pre-Screening Questions:  . In the past 7 to 10 days have you had a cough,  shortness of breath, headache, congestion, fever (100 or greater) body aches, chills, sore throat, or sudden loss of taste or sense of smell? no . Have you been around anyone with known Covid 19? no . Have you been around anyone who is awaiting Covid 19 test results in the past 7 to 10 days? no . Have you been around anyone who has been exposed to Covid 19, or has mentioned symptoms of Covid 19 within the past 7 to 10 days? no  I reviewed procedure/mask/visitor/Covid 19 screening questions with patient, he verbalized understanding.

## 2019-02-19 ENCOUNTER — Encounter (HOSPITAL_COMMUNITY)
Admission: RE | Disposition: A | Discharge status: Home / Self Care | Payer: Self-pay | Source: Home / Self Care | Attending: Interventional Cardiology

## 2019-02-19 ENCOUNTER — Encounter (HOSPITAL_COMMUNITY): Payer: Self-pay | Admitting: Interventional Cardiology

## 2019-02-19 ENCOUNTER — Ambulatory Visit (HOSPITAL_BASED_OUTPATIENT_CLINIC_OR_DEPARTMENT_OTHER)
Admission: RE | Admit: 2019-02-19 | Discharge: 2019-02-19 | Disposition: A | Discharge status: Home / Self Care | Payer: Medicare Other | Source: Home / Self Care | Attending: Interventional Cardiology | Admitting: Interventional Cardiology

## 2019-02-19 ENCOUNTER — Other Ambulatory Visit: Payer: Self-pay

## 2019-02-19 ENCOUNTER — Emergency Department (HOSPITAL_COMMUNITY)
Admission: EM | Admit: 2019-02-19 | Discharge: 2019-02-19 | Disposition: A | Discharge status: Home / Self Care | Payer: Medicare Other | Attending: Emergency Medicine | Admitting: Emergency Medicine

## 2019-02-19 DIAGNOSIS — Z91041 Radiographic dye allergy status: Secondary | ICD-10-CM | POA: Insufficient documentation

## 2019-02-19 DIAGNOSIS — I2584 Coronary atherosclerosis due to calcified coronary lesion: Secondary | ICD-10-CM | POA: Diagnosis not present

## 2019-02-19 DIAGNOSIS — M1990 Unspecified osteoarthritis, unspecified site: Secondary | ICD-10-CM | POA: Diagnosis not present

## 2019-02-19 DIAGNOSIS — T7840XA Allergy, unspecified, initial encounter: Secondary | ICD-10-CM | POA: Diagnosis not present

## 2019-02-19 DIAGNOSIS — I25118 Atherosclerotic heart disease of native coronary artery with other forms of angina pectoris: Secondary | ICD-10-CM | POA: Diagnosis not present

## 2019-02-19 DIAGNOSIS — Z8249 Family history of ischemic heart disease and other diseases of the circulatory system: Secondary | ICD-10-CM | POA: Diagnosis not present

## 2019-02-19 DIAGNOSIS — Z87891 Personal history of nicotine dependence: Secondary | ICD-10-CM | POA: Insufficient documentation

## 2019-02-19 DIAGNOSIS — R7303 Prediabetes: Secondary | ICD-10-CM

## 2019-02-19 DIAGNOSIS — Z825 Family history of asthma and other chronic lower respiratory diseases: Secondary | ICD-10-CM | POA: Diagnosis not present

## 2019-02-19 DIAGNOSIS — I5032 Chronic diastolic (congestive) heart failure: Secondary | ICD-10-CM | POA: Diagnosis not present

## 2019-02-19 DIAGNOSIS — I2510 Atherosclerotic heart disease of native coronary artery without angina pectoris: Secondary | ICD-10-CM | POA: Diagnosis present

## 2019-02-19 DIAGNOSIS — Z955 Presence of coronary angioplasty implant and graft: Secondary | ICD-10-CM | POA: Insufficient documentation

## 2019-02-19 DIAGNOSIS — I700 Atherosclerosis of aorta: Secondary | ICD-10-CM | POA: Diagnosis present

## 2019-02-19 DIAGNOSIS — E119 Type 2 diabetes mellitus without complications: Secondary | ICD-10-CM | POA: Insufficient documentation

## 2019-02-19 DIAGNOSIS — L500 Allergic urticaria: Secondary | ICD-10-CM | POA: Diagnosis not present

## 2019-02-19 DIAGNOSIS — Z888 Allergy status to other drugs, medicaments and biological substances status: Secondary | ICD-10-CM | POA: Diagnosis not present

## 2019-02-19 DIAGNOSIS — J449 Chronic obstructive pulmonary disease, unspecified: Secondary | ICD-10-CM | POA: Insufficient documentation

## 2019-02-19 DIAGNOSIS — Z7982 Long term (current) use of aspirin: Secondary | ICD-10-CM | POA: Insufficient documentation

## 2019-02-19 DIAGNOSIS — Z7951 Long term (current) use of inhaled steroids: Secondary | ICD-10-CM | POA: Diagnosis not present

## 2019-02-19 DIAGNOSIS — K219 Gastro-esophageal reflux disease without esophagitis: Secondary | ICD-10-CM | POA: Diagnosis not present

## 2019-02-19 DIAGNOSIS — E785 Hyperlipidemia, unspecified: Secondary | ICD-10-CM | POA: Diagnosis not present

## 2019-02-19 DIAGNOSIS — L234 Allergic contact dermatitis due to dyes: Secondary | ICD-10-CM | POA: Diagnosis not present

## 2019-02-19 DIAGNOSIS — Z79899 Other long term (current) drug therapy: Secondary | ICD-10-CM | POA: Diagnosis not present

## 2019-02-19 DIAGNOSIS — I110 Hypertensive heart disease with heart failure: Secondary | ICD-10-CM | POA: Insufficient documentation

## 2019-02-19 DIAGNOSIS — I25119 Atherosclerotic heart disease of native coronary artery with unspecified angina pectoris: Secondary | ICD-10-CM | POA: Insufficient documentation

## 2019-02-19 DIAGNOSIS — Z87828 Personal history of other (healed) physical injury and trauma: Secondary | ICD-10-CM | POA: Diagnosis not present

## 2019-02-19 HISTORY — PX: LEFT HEART CATH AND CORONARY ANGIOGRAPHY: CATH118249

## 2019-02-19 SURGERY — LEFT HEART CATH AND CORONARY ANGIOGRAPHY
Anesthesia: LOCAL

## 2019-02-19 MED ORDER — SODIUM CHLORIDE 0.9% FLUSH: 3.0000 mL | Freq: Two times a day (BID) | INTRAVENOUS | Status: DC

## 2019-02-19 MED ORDER — FENTANYL CITRATE (PF) 100 MCG/2ML IJ SOLN
INTRAMUSCULAR | Status: DC | PRN
  Administered 2019-02-19: 25 ug via INTRAVENOUS

## 2019-02-19 MED ORDER — DIPHENHYDRAMINE HCL 50 MG/ML IJ SOLN
25.0000 mg | Freq: Once | INTRAMUSCULAR | Status: AC
  Administered 2019-02-19: 25 mg via INTRAMUSCULAR
  Filled 2019-02-19: qty 1

## 2019-02-19 MED ORDER — ASPIRIN 81 MG PO CHEW: 81.0000 mg | CHEWABLE_TABLET | ORAL | Status: DC

## 2019-02-19 MED ORDER — ACETAMINOPHEN 325 MG PO TABS: 650.0000 mg | ORAL_TABLET | ORAL | Status: DC | PRN

## 2019-02-19 MED ORDER — LABETALOL HCL 5 MG/ML IV SOLN: 10.0000 mg | INTRAVENOUS | Status: DC | PRN

## 2019-02-19 MED ORDER — ONDANSETRON HCL 4 MG/2ML IJ SOLN: 4.0000 mg | Freq: Four times a day (QID) | INTRAMUSCULAR | Status: DC | PRN

## 2019-02-19 MED ORDER — HEPARIN (PORCINE) IN NACL 1000-0.9 UT/500ML-% IV SOLN
INTRAVENOUS | Status: AC
  Filled 2019-02-19: qty 1000

## 2019-02-19 MED ORDER — IOHEXOL 350 MG/ML SOLN
INTRAVENOUS | Status: DC | PRN
  Administered 2019-02-19: 85 mL via INTRA_ARTERIAL

## 2019-02-19 MED ORDER — HEPARIN SODIUM (PORCINE) 1000 UNIT/ML IJ SOLN
INTRAMUSCULAR | Status: DC | PRN
  Administered 2019-02-19: 4500 [IU] via INTRAVENOUS

## 2019-02-19 MED ORDER — DEXAMETHASONE SODIUM PHOSPHATE 4 MG/ML IJ SOLN
8.0000 mg | Freq: Once | INTRAMUSCULAR | Status: DC
  Filled 2019-02-19: qty 2

## 2019-02-19 MED ORDER — MIDAZOLAM HCL 2 MG/2ML IJ SOLN
INTRAMUSCULAR | Status: AC
  Filled 2019-02-19: qty 2

## 2019-02-19 MED ORDER — SODIUM CHLORIDE 0.9 % IV SOLN
INTRAVENOUS | Status: DC
  Administered 2019-02-19: 07:00:00 via INTRAVENOUS

## 2019-02-19 MED ORDER — SODIUM CHLORIDE 0.9 % IV SOLN: 250.0000 mL | INTRAVENOUS | Status: DC | PRN

## 2019-02-19 MED ORDER — VERAPAMIL HCL 2.5 MG/ML IV SOLN
INTRAVENOUS | Status: DC | PRN
  Administered 2019-02-19: 10 mL via INTRA_ARTERIAL

## 2019-02-19 MED ORDER — SODIUM CHLORIDE 0.9% FLUSH: 3.0000 mL | INTRAVENOUS | Status: DC | PRN

## 2019-02-19 MED ORDER — HEPARIN (PORCINE) IN NACL 1000-0.9 UT/500ML-% IV SOLN
INTRAVENOUS | Status: DC | PRN
  Administered 2019-02-19 (×2): 500 mL

## 2019-02-19 MED ORDER — ASPIRIN 81 MG PO CHEW: 81.0000 mg | CHEWABLE_TABLET | Freq: Every day | ORAL | Status: DC

## 2019-02-19 MED ORDER — FENTANYL CITRATE (PF) 100 MCG/2ML IJ SOLN
INTRAMUSCULAR | Status: AC
  Filled 2019-02-19: qty 2

## 2019-02-19 MED ORDER — METHYLPREDNISOLONE ACETATE 80 MG/ML IJ SUSP
80.0000 mg | Freq: Once | INTRAMUSCULAR | Status: AC
  Administered 2019-02-19: 80 mg via INTRAMUSCULAR
  Filled 2019-02-19: qty 1

## 2019-02-19 MED ORDER — SODIUM CHLORIDE 0.9 % IV SOLN: INTRAVENOUS | Status: DC

## 2019-02-19 MED ORDER — FAMOTIDINE 20 MG PO TABS
20.0000 mg | ORAL_TABLET | Freq: Once | ORAL | Status: AC
  Administered 2019-02-19: 20 mg via ORAL
  Filled 2019-02-19: qty 1

## 2019-02-19 MED ORDER — LIDOCAINE HCL (PF) 1 % IJ SOLN
INTRAMUSCULAR | Status: AC
  Filled 2019-02-19: qty 30

## 2019-02-19 MED ORDER — MIDAZOLAM HCL 2 MG/2ML IJ SOLN
INTRAMUSCULAR | Status: DC | PRN
  Administered 2019-02-19: 1 mg via INTRAVENOUS

## 2019-02-19 MED ORDER — VERAPAMIL HCL 2.5 MG/ML IV SOLN
INTRAVENOUS | Status: AC
  Filled 2019-02-19: qty 2

## 2019-02-19 MED ORDER — PREDNISONE 20 MG PO TABS: ORAL_TABLET | ORAL | 0 refills | Status: DC

## 2019-02-19 MED ORDER — HYDRALAZINE HCL 20 MG/ML IJ SOLN: 10.0000 mg | INTRAMUSCULAR | Status: DC | PRN

## 2019-02-19 SURGICAL SUPPLY — 11 items
CATH INFINITI 5 FR JL3.5 (CATHETERS) ×1 IMPLANT
CATH INFINITI JR4 5F (CATHETERS) ×1 IMPLANT
DEVICE RAD COMP TR BAND LRG (VASCULAR PRODUCTS) ×1 IMPLANT
GLIDESHEATH SLEND A-KIT 6F 22G (SHEATH) ×1 IMPLANT
GUIDEWIRE INQWIRE 1.5J.035X260 (WIRE) IMPLANT
INQWIRE 1.5J .035X260CM (WIRE) ×2
KIT HEART LEFT (KITS) ×2 IMPLANT
PACK CARDIAC CATHETERIZATION (CUSTOM PROCEDURE TRAY) ×2 IMPLANT
SHEATH PROBE COVER 6X72 (BAG) ×1 IMPLANT
TRANSDUCER W/STOPCOCK (MISCELLANEOUS) ×2 IMPLANT
TUBING CIL FLEX 10 FLL-RA (TUBING) ×2 IMPLANT

## 2019-02-19 NOTE — Discharge Instructions (Signed)
Drink plenty of fluids °Keep right arm at or above heart level  °Radial Site Care ° °This sheet gives you information about how to care for yourself after your procedure. Your health care provider may also give you more specific instructions. If you have problems or questions, contact your health care provider. °What can I expect after the procedure? °After the procedure, it is common to have: °· Bruising and tenderness at the catheter insertion area. °Follow these instructions at home: °Medicines °· Take over-the-counter and prescription medicines only as told by your health care provider. °Insertion site care °· Follow instructions from your health care provider about how to take care of your insertion site. Make sure you: °? Wash your hands with soap and water before you change your bandage (dressing). If soap and water are not available, use hand sanitizer. °? Change your dressing as told by your health care provider. °? Leave stitches (sutures), skin glue, or adhesive strips in place. These skin closures may need to stay in place for 2 weeks or longer. If adhesive strip edges start to loosen and curl up, you may trim the loose edges. Do not remove adhesive strips completely unless your health care provider tells you to do that. °· Check your insertion site every day for signs of infection. Check for: °? Redness, swelling, or pain. °? Fluid or blood. °? Pus or a bad smell. °? Warmth. °· Do not take baths, swim, or use a hot tub until your health care provider approves. °· You may shower 24-48 hours after the procedure, or as directed by your health care provider. °? Remove the dressing and gently wash the site with plain soap and water. °? Pat the area dry with a clean towel. °? Do not rub the site. That could cause bleeding. °· Do not apply powder or lotion to the site. °Activity ° °· For 24 hours after the procedure, or as directed by your health care provider: °? Do not flex or bend the affected arm. °? Do  not push or pull heavy objects with the affected arm. °? Do not drive yourself home from the hospital or clinic. You may drive 24 hours after the procedure unless your health care provider tells you not to. °? Do not operate machinery or power tools. °· Do not lift anything that is heavier than 10 lb (4.5 kg), or the limit that you are told, until your health care provider says that it is safe. °· Ask your health care provider when it is okay to: °? Return to work or school. °? Resume usual physical activities or sports. °? Resume sexual activity. °General instructions °· If the catheter site starts to bleed, raise your arm and put firm pressure on the site. If the bleeding does not stop, get help right away. This is a medical emergency. °· If you went home on the same day as your procedure, a responsible adult should be with you for the first 24 hours after you arrive home. °· Keep all follow-up visits as told by your health care provider. This is important. °Contact a health care provider if: °· You have a fever. °· You have redness, swelling, or yellow drainage around your insertion site. °Get help right away if: °· You have unusual pain at the radial site. °· The catheter insertion area swells very fast. °· The insertion area is bleeding, and the bleeding does not stop when you hold steady pressure on the area. °· Your arm or hand   hand becomes pale, cool, tingly, or numb. °These symptoms may represent a serious problem that is an emergency. Do not wait to see if the symptoms will go away. Get medical help right away. Call your local emergency services (911 in the [place] Do not drive yourself to the hospital. °Summary °· After the procedure, it is common to have bruising and tenderness at the site. °· Follow instructions from your health care provider about how to take care of your radial site wound. Check the wound every day for signs of infection. °· Do not lift anything that is heavier than 10 lb (4.5 kg), or the  limit that you are told, until your health care provider says that it is safe. °This information is not intended to replace advice given to you by your health care provider. Make sure you discuss any questions you have with your health care provider. °Document Released: 10/05/2010 Document Revised: 10/08/2017 Document Reviewed: 10/08/2017 °Elsevier Interactive Patient Education © 2019 [employer] ° °

## 2019-02-19 NOTE — Progress Notes (Signed)
Unable to get second IV cath lab called states to bring him on over and they will get second IV there

## 2019-02-19 NOTE — ED Notes (Signed)
Pt states he does not want the injections, he wants prednisone 80mg  po; [place] PA informed and in to speak with patient

## 2019-02-19 NOTE — Interval H&P Note (Signed)
Cath Lab Visit (complete for each Cath Lab visit)  Clinical Evaluation Leading to the Procedure:   ACS: Yes.    Non-ACS:    Anginal Classification: CCS III  Anti-ischemic medical therapy: Maximal Therapy (2 or more classes of medications)  Non-Invasive Test Results: No non-invasive testing performed  Prior CABG: No previous CABG      History and Physical Interval Note:  02/19/2019 7:38 AM  Benjamin [name]  has presented today for surgery, with the diagnosis of Abnormal Cardiac CT.  The various methods of treatment have been discussed with the patient and family. After consideration of risks, benefits and other options for treatment, the patient has consented to  Procedure(s): LEFT HEART CATH AND CORONARY ANGIOGRAPHY (N/A) as a surgical intervention.  The patient's history has been reviewed, patient examined, no change in status, stable for surgery.  I have reviewed the patient's chart and labs.  Questions were answered to the patient's satisfaction.     [name] III

## 2019-02-19 NOTE — Progress Notes (Signed)
D/C instructions given to patient as well as to wife Clara over the phone.

## 2019-02-19 NOTE — ED Triage Notes (Signed)
Pt c/o itching after have a heart catherzation this afternoon and receiving dye. States he was on a prednisone regimen prior to the procedure. States he hasn't taken anything for the itching since it started about an hour ago

## 2019-02-19 NOTE — Discharge Instructions (Signed)
Please read instructions below. You can take the hydroxyzine as prescribed as needed for itching.  You can take a Pepcid every 12 hours with this as well for additional symptom relief. Avoid scratching as much as possible. Getting tomorrow, take the prednisone as directed. Follow up with your primary care provider if rash doesn't improve in the next few days. Return to the ER for fever, difficulty breathing or swallowing, or new or worsening symptoms.
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
## 2019-02-22 ENCOUNTER — Other Ambulatory Visit: Payer: Self-pay

## 2019-02-22 ENCOUNTER — Telehealth: Payer: Self-pay

## 2019-02-22 DIAGNOSIS — R931 Abnormal findings on diagnostic imaging of heart and coronary circulation: Secondary | ICD-10-CM

## 2019-02-22 DIAGNOSIS — I2510 Atherosclerotic heart disease of native coronary artery without angina pectoris: Secondary | ICD-10-CM

## 2019-02-22 MED FILL — Lidocaine HCl Local Preservative Free (PF) Inj 1%: INTRAMUSCULAR | Qty: 30 | Status: AC

## 2019-02-22 NOTE — Telephone Encounter (Signed)
Pt has apt already with [facility]

## 2019-02-23 ENCOUNTER — Other Ambulatory Visit: Payer: Self-pay | Admitting: *Deleted

## 2019-02-23 ENCOUNTER — Institutional Professional Consult (permissible substitution) (INDEPENDENT_AMBULATORY_CARE_PROVIDER_SITE_OTHER): Payer: Medicare Other | Admitting: Surgery

## 2019-02-23 ENCOUNTER — Encounter: Payer: Self-pay | Admitting: Surgery

## 2019-02-23 VITALS — BP 104/70 | HR 93 | Temp 97.5°F | Resp 20 | Ht 71.5 in | Wt 188.0 lb

## 2019-02-23 DIAGNOSIS — I2510 Atherosclerotic heart disease of native coronary artery without angina pectoris: Secondary | ICD-10-CM | POA: Diagnosis not present
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
## 2019-02-24 DIAGNOSIS — K580 Irritable bowel syndrome with diarrhea: Secondary | ICD-10-CM | POA: Diagnosis not present

## 2019-02-24 DIAGNOSIS — Z86010 Personal history of colonic polyps: Secondary | ICD-10-CM | POA: Diagnosis not present

## 2019-02-24 DIAGNOSIS — K219 Gastro-esophageal reflux disease without esophagitis: Secondary | ICD-10-CM | POA: Diagnosis not present

## 2019-02-24 DIAGNOSIS — Z8719 Personal history of other diseases of the digestive system: Secondary | ICD-10-CM | POA: Diagnosis not present

## 2019-02-24 NOTE — Pre-Procedure Instructions (Signed)
Benjamin [name]  02/24/2019      [facility], [place] - [id number] [place] [address] Phone: [phone] Fax: [phone]    Your procedure is scheduled on Mon., March 01, 2019 from 7:30AM-1:15PM  Report to [facility] Entrance "A" at 5:30AM  Call this number if you have problems the morning of surgery:  [phone]   Remember:  Do not eat or drink after midnight on June 14th    Take these medicines the morning of surgery with A SIP OF WATER: Colestipol (COLESTID), PredniSONE (DELTASONE), RABEprazole (ACIPHEX), Tamsulosin (FLOMAX), and Fluticasone furoate-vilanterol (BREO ELLIPTA)-bring with you the day of surgery  If needed: Acetaminophen (TYLENOL), DiphenhydrAMINE (BENADRYL), Loperamide (IMODIUM), Promethazine (PHENERGAN), and Albuterol Nebulizer/Inhaler-bring inhaler with you the day of surgery  Follow your surgeon's instructions on when to stop Aspirin.  If no instructions were given by your surgeon then you will need to call the office to get those instructions.     As of today, stop taking all Other Aspirin Products, Vitamins, Fish oils, and Herbal medications. Also stop all NSAIDS i.e. Advil, Ibuprofen, Motrin, Aleve, Anaprox, Naproxen, BC, Goody Powders, and all Supplements.  Special instructions:   [facility]- Preparing For Surgery  Before surgery, you can play an important role. Because skin is not sterile, your skin needs to be as free of germs as possible. You can reduce the number of germs on your skin by washing with CHG (chlorahexidine gluconate) Soap before surgery.  CHG is an antiseptic cleaner which kills germs and bonds with the skin to continue killing germs even after washing.    Please do not use if you have an allergy to CHG or antibacterial soaps. If your skin becomes reddened/irritated stop using the CHG.  Do not shave (including legs and underarms) for at least 48 hours prior to first CHG shower. It  is OK to shave your face.  Please follow these instructions carefully.   1. Shower the NIGHT BEFORE SURGERY and the MORNING OF SURGERY with CHG.   2. If you chose to wash your hair, wash your hair first as usual with your normal shampoo.  3. After you shampoo, rinse your hair and body thoroughly to remove the shampoo.  4. Use CHG as you would any other liquid soap. You can apply CHG directly to the skin and wash gently with a scrungie or a clean washcloth.   5. Apply the CHG Soap to your body ONLY FROM THE NECK DOWN.  Do not use on open wounds or open sores. Avoid contact with your eyes, ears, mouth and genitals (private parts). Wash Face and genitals (private parts)  with your normal soap.  6. Wash thoroughly, paying special attention to the area where your surgery will be performed.  7. Thoroughly rinse your body with warm water from the neck down.  8. DO NOT shower/wash with your normal soap after using and rinsing off the CHG Soap.  9. Pat yourself dry with a CLEAN TOWEL.  10. Wear CLEAN PAJAMAS to bed the night before surgery, wear comfortable clothes the morning of surgery  11. Place CLEAN SHEETS on your bed the night of your first shower and DO NOT SLEEP WITH PETS.  Day of Surgery:  Oral Hygiene is also important to reduce your risk of infection.  Remember - BRUSH YOUR TEETH THE MORNING OF SURGERY WITH YOUR REGULAR TOOTHPASTE   Do not wear jewelry.  Do not wear lotions,  powders, colognes, or deodorant.  Do not shave 48 hours prior to surgery.  Men may shave face.  Do not bring valuables to the hospital.  [facility] is not responsible for any belongings or valuables.  Contacts, dentures or bridgework may not be worn into surgery.   For patients admitted to the hospital, discharge time will be determined by your treatment team.  Patients discharged the day of surgery will not be allowed to drive home.   Please wear clean clothes to the hospital/surgery center.     Please read over the following fact sheets that you were given. Pain Booklet, Coughing and Deep Breathing, MRSA Information and Surgical Site Infection Prevention

## 2019-02-25 ENCOUNTER — Other Ambulatory Visit (HOSPITAL_COMMUNITY): Payer: Medicare Other

## 2019-02-25 ENCOUNTER — Encounter (HOSPITAL_COMMUNITY): Payer: Medicare Other

## 2019-02-25 ENCOUNTER — Ambulatory Visit (HOSPITAL_BASED_OUTPATIENT_CLINIC_OR_DEPARTMENT_OTHER)
Admission: RE | Admit: 2019-02-25 | Discharge: 2019-02-25 | Disposition: A | Discharge status: Home / Self Care | Payer: Medicare Other | Source: Ambulatory Visit | Attending: Surgery | Admitting: Surgery

## 2019-02-25 ENCOUNTER — Encounter (HOSPITAL_COMMUNITY)
Admission: RE | Admit: 2019-02-25 | Discharge: 2019-02-25 | Disposition: A | Discharge status: Home / Self Care | Payer: Medicare Other | Source: Ambulatory Visit | Attending: Surgery | Admitting: Surgery

## 2019-02-25 ENCOUNTER — Encounter (HOSPITAL_COMMUNITY): Payer: Self-pay

## 2019-02-25 ENCOUNTER — Other Ambulatory Visit: Payer: Self-pay

## 2019-02-25 ENCOUNTER — Ambulatory Visit (HOSPITAL_COMMUNITY)
Admission: RE | Admit: 2019-02-25 | Discharge: 2019-02-25 | Disposition: A | Discharge status: Home / Self Care | Payer: Medicare Other | Source: Ambulatory Visit | Attending: Surgery | Admitting: Surgery

## 2019-02-25 DIAGNOSIS — I2510 Atherosclerotic heart disease of native coronary artery without angina pectoris: Secondary | ICD-10-CM

## 2019-02-25 DIAGNOSIS — J988 Other specified respiratory disorders: Secondary | ICD-10-CM | POA: Diagnosis not present

## 2019-02-25 DIAGNOSIS — Z01810 Encounter for preprocedural cardiovascular examination: Secondary | ICD-10-CM | POA: Diagnosis not present

## 2019-02-25 DIAGNOSIS — Z01818 Encounter for other preprocedural examination: Secondary | ICD-10-CM | POA: Insufficient documentation

## 2019-02-25 DIAGNOSIS — Z1159 Encounter for screening for other viral diseases: Secondary | ICD-10-CM | POA: Diagnosis not present

## 2019-02-25 DIAGNOSIS — I6523 Occlusion and stenosis of bilateral carotid arteries: Secondary | ICD-10-CM | POA: Insufficient documentation

## 2019-02-25 HISTORY — DX: Atherosclerotic heart disease of native coronary artery without angina pectoris: I25.10

## 2019-02-25 LAB — URINALYSIS, ROUTINE W REFLEX MICROSCOPIC
Bilirubin Urine: NEGATIVE
Glucose, UA: NEGATIVE mg/dL
Hgb urine dipstick: NEGATIVE
Ketones, ur: NEGATIVE mg/dL
Leukocytes,Ua: NEGATIVE
Nitrite: NEGATIVE
Protein, ur: NEGATIVE mg/dL
Specific Gravity, Urine: 1.019 (ref 1.005–1.030)
pH: 5 (ref 5.0–8.0)

## 2019-02-25 LAB — COMPREHENSIVE METABOLIC PANEL
ALT: 81 U/L — ABNORMAL HIGH (ref 0–44)
AST: 32 U/L (ref 15–41)
Albumin: 4.1 g/dL (ref 3.5–5.0)
Alkaline Phosphatase: 50 U/L (ref 38–126)
Anion gap: 12 (ref 5–15)
BUN: 21 mg/dL (ref 8–23)
CO2: 21 mmol/L — ABNORMAL LOW (ref 22–32)
Calcium: 9.4 mg/dL (ref 8.9–10.3)
Chloride: 107 mmol/L (ref 98–111)
Creatinine, Ser: 1.07 mg/dL (ref 0.61–1.24)
GFR calc Af Amer: >60 mL/min (ref >60)
GFR calc non Af Amer: >60 mL/min (ref >60)
Glucose, Bld: 125 mg/dL — ABNORMAL HIGH (ref 70–99)
Potassium: 3.9 mmol/L (ref 3.5–5.1)
Sodium: 140 mmol/L (ref 135–145)
Total Bilirubin: 1.1 mg/dL (ref 0.3–1.2)
Total Protein: 7 g/dL (ref 6.5–8.1)

## 2019-02-25 LAB — PULMONARY FUNCTION TEST
DL/VA % pred: 55 %
DL/VA: 2.18 ml/min/mmHg/L
DLCO unc % pred: 32 %
DLCO unc: 8.36 ml/min/mmHg
FEF 25-75 Post: 0.33 L/sec
FEF 25-75 Pre: 0.64 L/sec
FEF2575-%Change-Post: -47 %
FEF2575-%Pred-Post: 15 %
FEF2575-%Pred-Pre: 28 %
FEV1-%Change-Post: -27 %
FEV1-%Pred-Post: 52 %
FEV1-%Pred-Pre: 71 %
FEV1-Post: 1.62 L
FEV1-Pre: 2.23 L
FEV1FVC-%Change-Post: -17 %
FEV1FVC-%Pred-Pre: 88 %
FEV6-%Change-Post: -12 %
FEV6-%Pred-Post: 65 %
FEV6-%Pred-Pre: 75 %
FEV6-Post: 2.67 L
FEV6-Pre: 3.05 L
FEV6FVC-%Change-Post: 0 %
FEV6FVC-%Pred-Post: 92 %
FEV6FVC-%Pred-Pre: 93 %
FVC-%Change-Post: -11 %
FVC-%Pred-Post: 71 %
FVC-%Pred-Pre: 80 %
FVC-Post: 3.08 L
FVC-Pre: 3.49 L
Post FEV1/FVC ratio: 53 %
Post FEV6/FVC ratio: 87 %
Pre FEV1/FVC ratio: 64 %
Pre FEV6/FVC Ratio: 87 %
RV % pred: 73 %
RV: 1.95 L
TLC % pred: 73 %
TLC: 5.35 L

## 2019-02-25 LAB — TYPE AND SCREEN
ABO/RH(D): A POS
Antibody Screen: NEGATIVE

## 2019-02-25 LAB — SURGICAL PCR SCREEN
MRSA, PCR: NEGATIVE
Staphylococcus aureus: POSITIVE — AB

## 2019-02-25 LAB — CBC
HCT: 50.2 % (ref 39.0–52.0)
Hemoglobin: 16.9 g/dL (ref 13.0–17.0)
MCH: 31.6 pg (ref 26.0–34.0)
MCHC: 33.7 g/dL (ref 30.0–36.0)
MCV: 93.8 fL (ref 80.0–100.0)
Platelets: 262 10*3/uL (ref 150–400)
RBC: 5.35 MIL/uL (ref 4.22–5.81)
RDW: 13.4 % (ref 11.5–15.5)
WBC: 11.1 10*3/uL — ABNORMAL HIGH (ref 4.0–10.5)
nRBC: 0 % (ref 0.0–0.2)

## 2019-02-25 LAB — PROTIME-INR
INR: 1.1 (ref 0.8–1.2)
Prothrombin Time: 14.2 seconds (ref 11.4–15.2)

## 2019-02-25 LAB — HEMOGLOBIN A1C
Hgb A1c MFr Bld: 6.3 % — ABNORMAL HIGH (ref 4.8–5.6)
Mean Plasma Glucose: 134.11 mg/dL

## 2019-02-25 LAB — APTT: aPTT: 27 seconds (ref 24–36)

## 2019-02-25 LAB — ABO/RH: ABO/RH(D): A POS

## 2019-02-25 MED ORDER — ALBUTEROL SULFATE (2.5 MG/3ML) 0.083% IN NEBU
2.5000 mg | INHALATION_SOLUTION | Freq: Once | RESPIRATORY_TRACT | Status: AC
  Administered 2019-02-25: 09:00:00 2.5 mg via RESPIRATORY_TRACT

## 2019-02-25 NOTE — Progress Notes (Signed)
PCP - K. Buelah Manis, [place] [facility]  Cardiologist - Koneswaran  Chest x-ray -today  EKG - 02/19/2019 Stress Test -  ECHO - 10/23/2018 Cardiac Cath - 02/19/2019  Sleep Study - never CPAP - n/a  Fasting Blood Sugar - n/a Checks Blood Sugar _____ times a day  Blood Thinner Instructions: n/a Aspirin Instructions: pt. Reports MD told him to continue 81mg . daily  Anesthesia review: yes- sending   Patient denies shortness of breath, fever, cough and chest pain at PAT appointment   Patient verbalized understanding of instructions that were given to them at the PAT appointment. Patient was also instructed that they will need to review over the PAT instructions again at home before surgery.

## 2019-02-25 NOTE — Progress Notes (Signed)
PRE CABG has been completed.   Preliminary results in CV Proc.   Benjamin [name] 02/25/2019 11:58 AM

## 2019-02-26 LAB — NOVEL CORONAVIRUS, NAA (HOSP ORDER, SEND-OUT TO REF LAB; TAT 18-24 HRS): SARS-CoV-2, NAA: NOT DETECTED

## 2019-02-26 MED ORDER — TRANEXAMIC ACID 1000 MG/10ML IV SOLN
1.5000 mg/kg/h | INTRAVENOUS | Status: AC
  Administered 2019-03-01: 1.5 mg/kg/h via INTRAVENOUS
  Filled 2019-02-26: qty 25

## 2019-02-26 MED ORDER — DOPAMINE-DEXTROSE 3.2-5 MG/ML-% IV SOLN
0.0000 ug/kg/min | INTRAVENOUS | Status: DC
  Filled 2019-02-26: qty 250

## 2019-02-26 MED ORDER — SODIUM CHLORIDE 0.9 % IV SOLN
1.5000 g | INTRAVENOUS | Status: AC
  Administered 2019-03-01: 1.5 g via INTRAVENOUS
  Filled 2019-02-26: qty 1.5

## 2019-02-26 MED ORDER — DEXMEDETOMIDINE HCL IN NACL 400 MCG/100ML IV SOLN
0.1000 ug/kg/h | INTRAVENOUS | Status: AC
  Administered 2019-03-01: .2 ug/kg/h via INTRAVENOUS
  Filled 2019-02-26: qty 100

## 2019-02-26 MED ORDER — MILRINONE LACTATE IN DEXTROSE 20-5 MG/100ML-% IV SOLN
0.3000 ug/kg/min | INTRAVENOUS | Status: DC
  Filled 2019-02-26: qty 100

## 2019-02-26 MED ORDER — INSULIN REGULAR(HUMAN) IN NACL 100-0.9 UT/100ML-% IV SOLN
INTRAVENOUS | Status: AC
  Administered 2019-03-01: 1.1 [IU]/h via INTRAVENOUS
  Filled 2019-02-26: qty 100

## 2019-02-26 MED ORDER — NOREPINEPHRINE 4 MG/250ML-% IV SOLN
0.0000 ug/min | INTRAVENOUS | Status: DC
  Filled 2019-02-26: qty 250

## 2019-02-26 MED ORDER — SODIUM CHLORIDE 0.9 % IV SOLN
INTRAVENOUS | Status: DC
  Filled 2019-02-26: qty 30

## 2019-02-26 MED ORDER — SODIUM CHLORIDE 0.9 % IV SOLN
750.0000 mg | INTRAVENOUS | Status: DC
  Filled 2019-02-26: qty 750

## 2019-02-26 MED ORDER — EPINEPHRINE PF 1 MG/ML IJ SOLN
0.0000 ug/min | INTRAVENOUS | Status: DC
  Filled 2019-02-26: qty 4

## 2019-02-26 MED ORDER — MAGNESIUM SULFATE 50 % IJ SOLN
40.0000 meq | INTRAMUSCULAR | Status: DC
  Filled 2019-02-26: qty 9.85

## 2019-02-26 MED ORDER — PHENYLEPHRINE HCL-NACL 20-0.9 MG/250ML-% IV SOLN
30.0000 ug/min | INTRAVENOUS | Status: DC
  Filled 2019-02-26: qty 250

## 2019-02-26 MED ORDER — NITROGLYCERIN IN D5W 200-5 MCG/ML-% IV SOLN
2.0000 ug/min | INTRAVENOUS | Status: AC
  Administered 2019-03-01: 5 ug/min via INTRAVENOUS
  Filled 2019-02-26: qty 250

## 2019-02-26 MED ORDER — TRANEXAMIC ACID (OHS) BOLUS VIA INFUSION
15.0000 mg/kg | INTRAVENOUS | Status: AC
  Administered 2019-03-01: 1327.5 mg via INTRAVENOUS
  Filled 2019-02-26: qty 1328

## 2019-02-26 MED ORDER — TRANEXAMIC ACID (OHS) PUMP PRIME SOLUTION
2.0000 mg/kg | INTRAVENOUS | Status: DC
  Filled 2019-02-26: qty 1.77

## 2019-02-26 MED ORDER — POTASSIUM CHLORIDE 2 MEQ/ML IV SOLN
80.0000 meq | INTRAVENOUS | Status: DC
  Filled 2019-02-26: qty 40

## 2019-02-26 MED ORDER — VANCOMYCIN HCL 10 G IV SOLR
1500.0000 mg | INTRAVENOUS | Status: AC
  Administered 2019-03-01: 1500 mg via INTRAVENOUS
  Filled 2019-02-26: qty 1500

## 2019-02-26 MED ORDER — PLASMA-LYTE 148 IV SOLN
INTRAVENOUS | Status: AC
  Administered 2019-03-01: 500 mL
  Filled 2019-02-26: qty 2.5

## 2019-02-27 ENCOUNTER — Other Ambulatory Visit: Payer: Self-pay | Admitting: Cardiothoracic Surgery

## 2019-02-27 DIAGNOSIS — I2510 Atherosclerotic heart disease of native coronary artery without angina pectoris: Secondary | ICD-10-CM

## 2019-02-27 MED ORDER — MUPIROCIN 2 % EX OINT: TOPICAL_OINTMENT | Freq: Two times a day (BID) | CUTANEOUS | Status: DC

## 2019-02-27 MED ORDER — MUPIROCIN CALCIUM 2 % NA OINT: 1.0000 "application " | TOPICAL_OINTMENT | Freq: Two times a day (BID) | NASAL | 0 refills | Status: DC

## 2019-02-28 NOTE — Anesthesia Preprocedure Evaluation (Addendum)
Anesthesia Evaluation  Patient identified by MRN, date of birth, ID band  Reviewed: Allergy & Precautions, NPO status   Airway Mallampati: II  TM Distance: >3 FB     Dental   Pulmonary shortness of breath, COPD, former smoker,    breath sounds clear to auscultation       Cardiovascular + CAD and +CHF   Rhythm:Regular Rate:Normal     Neuro/Psych  Headaches,    GI/Hepatic PUD, GERD  ,  Endo/Other  diabetes  Renal/GU      Musculoskeletal  (+) Arthritis ,   Abdominal   Peds  Hematology   Anesthesia Other Findings   Reproductive/Obstetrics                            Anesthesia Physical Anesthesia Plan  ASA: III  Anesthesia Plan: General   Post-op Pain Management:    Induction: Intravenous  PONV Risk Score and Plan: Treatment may vary due to age or medical condition and Midazolam  Airway Management Planned: Oral ETT  Additional Equipment:   Intra-op Plan:   Post-operative Plan: Post-operative intubation/ventilation  Informed Consent: I have reviewed the patients History and Physical, chart, labs and discussed the procedure including the risks, benefits and alternatives for the proposed anesthesia with the patient or authorized representative who has indicated his/her understanding and acceptance.     Dental advisory given  Plan Discussed with: Anesthesiologist and CRNA  Anesthesia Plan Comments:        Anesthesia Quick Evaluation

## 2019-03-01 ENCOUNTER — Inpatient Hospital Stay (HOSPITAL_COMMUNITY)
Admission: RE | Admit: 2019-03-01 | Discharge: 2019-03-06 | DRG: 236 | Disposition: A | Discharge status: Home / Self Care | Payer: Medicare Other | Attending: Surgery | Admitting: Surgery

## 2019-03-01 ENCOUNTER — Inpatient Hospital Stay (HOSPITAL_COMMUNITY): Payer: Medicare Other | Admitting: Anesthesiology

## 2019-03-01 ENCOUNTER — Inpatient Hospital Stay (HOSPITAL_COMMUNITY): Payer: Medicare Other

## 2019-03-01 ENCOUNTER — Encounter (HOSPITAL_COMMUNITY)
Admission: RE | Disposition: A | Discharge status: Home / Self Care | Payer: Self-pay | Source: Home / Self Care | Attending: Surgery

## 2019-03-01 ENCOUNTER — Encounter (HOSPITAL_COMMUNITY): Payer: Self-pay | Admitting: Anesthesiology

## 2019-03-01 ENCOUNTER — Inpatient Hospital Stay (HOSPITAL_COMMUNITY): Payer: Medicare Other | Admitting: Physician Assistant

## 2019-03-01 ENCOUNTER — Other Ambulatory Visit: Payer: Self-pay

## 2019-03-01 DIAGNOSIS — E119 Type 2 diabetes mellitus without complications: Secondary | ICD-10-CM | POA: Diagnosis present

## 2019-03-01 DIAGNOSIS — E785 Hyperlipidemia, unspecified: Secondary | ICD-10-CM | POA: Diagnosis present

## 2019-03-01 DIAGNOSIS — J90 Pleural effusion, not elsewhere classified: Secondary | ICD-10-CM

## 2019-03-01 DIAGNOSIS — J449 Chronic obstructive pulmonary disease, unspecified: Secondary | ICD-10-CM | POA: Diagnosis not present

## 2019-03-01 DIAGNOSIS — Z9842 Cataract extraction status, left eye: Secondary | ICD-10-CM | POA: Diagnosis not present

## 2019-03-01 DIAGNOSIS — K219 Gastro-esophageal reflux disease without esophagitis: Secondary | ICD-10-CM | POA: Diagnosis present

## 2019-03-01 DIAGNOSIS — Z7952 Long term (current) use of systemic steroids: Secondary | ICD-10-CM | POA: Diagnosis not present

## 2019-03-01 DIAGNOSIS — Z91041 Radiographic dye allergy status: Secondary | ICD-10-CM

## 2019-03-01 DIAGNOSIS — I340 Nonrheumatic mitral (valve) insufficiency: Secondary | ICD-10-CM | POA: Diagnosis not present

## 2019-03-01 DIAGNOSIS — I5032 Chronic diastolic (congestive) heart failure: Secondary | ICD-10-CM | POA: Diagnosis not present

## 2019-03-01 DIAGNOSIS — I2584 Coronary atherosclerosis due to calcified coronary lesion: Secondary | ICD-10-CM | POA: Diagnosis not present

## 2019-03-01 DIAGNOSIS — D62 Acute posthemorrhagic anemia: Secondary | ICD-10-CM | POA: Diagnosis not present

## 2019-03-01 DIAGNOSIS — Z9861 Coronary angioplasty status: Secondary | ICD-10-CM | POA: Diagnosis not present

## 2019-03-01 DIAGNOSIS — Z91048 Other nonmedicinal substance allergy status: Secondary | ICD-10-CM

## 2019-03-01 DIAGNOSIS — N400 Enlarged prostate without lower urinary tract symptoms: Secondary | ICD-10-CM | POA: Diagnosis not present

## 2019-03-01 DIAGNOSIS — I2510 Atherosclerotic heart disease of native coronary artery without angina pectoris: Secondary | ICD-10-CM | POA: Diagnosis present

## 2019-03-01 DIAGNOSIS — Z888 Allergy status to other drugs, medicaments and biological substances status: Secondary | ICD-10-CM

## 2019-03-01 DIAGNOSIS — Z825 Family history of asthma and other chronic lower respiratory diseases: Secondary | ICD-10-CM

## 2019-03-01 DIAGNOSIS — J9811 Atelectasis: Secondary | ICD-10-CM | POA: Diagnosis not present

## 2019-03-01 DIAGNOSIS — I25118 Atherosclerotic heart disease of native coronary artery with other forms of angina pectoris: Principal | ICD-10-CM | POA: Diagnosis present

## 2019-03-01 DIAGNOSIS — Z87891 Personal history of nicotine dependence: Secondary | ICD-10-CM

## 2019-03-01 DIAGNOSIS — Z961 Presence of intraocular lens: Secondary | ICD-10-CM | POA: Diagnosis not present

## 2019-03-01 DIAGNOSIS — E871 Hypo-osmolality and hyponatremia: Secondary | ICD-10-CM | POA: Diagnosis not present

## 2019-03-01 DIAGNOSIS — Z79899 Other long term (current) drug therapy: Secondary | ICD-10-CM | POA: Diagnosis not present

## 2019-03-01 DIAGNOSIS — Z7951 Long term (current) use of inhaled steroids: Secondary | ICD-10-CM

## 2019-03-01 DIAGNOSIS — Z833 Family history of diabetes mellitus: Secondary | ICD-10-CM | POA: Diagnosis not present

## 2019-03-01 DIAGNOSIS — Z8249 Family history of ischemic heart disease and other diseases of the circulatory system: Secondary | ICD-10-CM

## 2019-03-01 DIAGNOSIS — I517 Cardiomegaly: Secondary | ICD-10-CM | POA: Diagnosis not present

## 2019-03-01 DIAGNOSIS — Z4682 Encounter for fitting and adjustment of non-vascular catheter: Secondary | ICD-10-CM | POA: Diagnosis not present

## 2019-03-01 DIAGNOSIS — Z7982 Long term (current) use of aspirin: Secondary | ICD-10-CM

## 2019-03-01 DIAGNOSIS — Z951 Presence of aortocoronary bypass graft: Secondary | ICD-10-CM

## 2019-03-01 DIAGNOSIS — Z9841 Cataract extraction status, right eye: Secondary | ICD-10-CM

## 2019-03-01 DIAGNOSIS — I700 Atherosclerosis of aorta: Secondary | ICD-10-CM | POA: Diagnosis not present

## 2019-03-01 DIAGNOSIS — E8770 Fluid overload, unspecified: Secondary | ICD-10-CM | POA: Diagnosis not present

## 2019-03-01 HISTORY — PX: CORONARY ARTERY BYPASS GRAFT: SHX141

## 2019-03-01 HISTORY — PX: TEE WITHOUT CARDIOVERSION: SHX5443

## 2019-03-01 HISTORY — DX: Presence of aortocoronary bypass graft: Z95.1

## 2019-03-01 LAB — POCT I-STAT 4, (NA,K, GLUC, HGB,HCT)
Glucose, Bld: 117 mg/dL — ABNORMAL HIGH (ref 70–99)
Glucose, Bld: 135 mg/dL — ABNORMAL HIGH (ref 70–99)
Glucose, Bld: 135 mg/dL — ABNORMAL HIGH (ref 70–99)
Glucose, Bld: 129 mg/dL — ABNORMAL HIGH (ref 70–99)
Glucose, Bld: 124 mg/dL — ABNORMAL HIGH (ref 70–99)
Glucose, Bld: 118 mg/dL — ABNORMAL HIGH (ref 70–99)
HCT: 41 % (ref 39.0–52.0)
HCT: 28 % — ABNORMAL LOW (ref 39.0–52.0)
HCT: 38 % — ABNORMAL LOW (ref 39.0–52.0)
HCT: 26 % — ABNORMAL LOW (ref 39.0–52.0)
HCT: 27 % — ABNORMAL LOW (ref 39.0–52.0)
HCT: 33 % — ABNORMAL LOW (ref 39.0–52.0)
Hemoglobin: 13.9 g/dL (ref 13.0–17.0)
Hemoglobin: 12.9 g/dL — ABNORMAL LOW (ref 13.0–17.0)
Hemoglobin: 9.5 g/dL — ABNORMAL LOW (ref 13.0–17.0)
Hemoglobin: 8.8 g/dL — ABNORMAL LOW (ref 13.0–17.0)
Hemoglobin: 9.2 g/dL — ABNORMAL LOW (ref 13.0–17.0)
Hemoglobin: 11.2 g/dL — ABNORMAL LOW (ref 13.0–17.0)
Potassium: 4.3 mmol/L (ref 3.5–5.1)
Potassium: 4.5 mmol/L (ref 3.5–5.1)
Potassium: 5.2 mmol/L — ABNORMAL HIGH (ref 3.5–5.1)
Potassium: 5.6 mmol/L — ABNORMAL HIGH (ref 3.5–5.1)
Potassium: 4.2 mmol/L (ref 3.5–5.1)
Potassium: 4.1 mmol/L (ref 3.5–5.1)
Sodium: 142 mmol/L (ref 135–145)
Sodium: 136 mmol/L (ref 135–145)
Sodium: 141 mmol/L (ref 135–145)
Sodium: 138 mmol/L (ref 135–145)
Sodium: 140 mmol/L (ref 135–145)
Sodium: 141 mmol/L (ref 135–145)

## 2019-03-01 LAB — POCT I-STAT 7, (LYTES, BLD GAS, ICA,H+H)
Acid-Base Excess: 3 mmol/L — ABNORMAL HIGH (ref 0.0–2.0)
Acid-base deficit: 2 mmol/L (ref 0.0–2.0)
Acid-base deficit: 2 mmol/L (ref 0.0–2.0)
Acid-base deficit: 3 mmol/L — ABNORMAL HIGH (ref 0.0–2.0)
Acid-base deficit: 4 mmol/L — ABNORMAL HIGH (ref 0.0–2.0)
Bicarbonate: 28.5 mmol/L — ABNORMAL HIGH (ref 20.0–28.0)
Bicarbonate: 23.1 mmol/L (ref 20.0–28.0)
Bicarbonate: 24 mmol/L (ref 20.0–28.0)
Bicarbonate: 22.4 mmol/L (ref 20.0–28.0)
Bicarbonate: 20.9 mmol/L (ref 20.0–28.0)
Calcium, Ion: 0.99 mmol/L — ABNORMAL LOW (ref 1.15–1.40)
Calcium, Ion: 1 mmol/L — ABNORMAL LOW (ref 1.15–1.40)
Calcium, Ion: 1.05 mmol/L — ABNORMAL LOW (ref 1.15–1.40)
Calcium, Ion: 1.06 mmol/L — ABNORMAL LOW (ref 1.15–1.40)
Calcium, Ion: 1.05 mmol/L — ABNORMAL LOW (ref 1.15–1.40)
HCT: 29 % — ABNORMAL LOW (ref 39.0–52.0)
HCT: 32 % — ABNORMAL LOW (ref 39.0–52.0)
HCT: 33 % — ABNORMAL LOW (ref 39.0–52.0)
HCT: 32 % — ABNORMAL LOW (ref 39.0–52.0)
HCT: 32 % — ABNORMAL LOW (ref 39.0–52.0)
Hemoglobin: 9.9 g/dL — ABNORMAL LOW (ref 13.0–17.0)
Hemoglobin: 10.9 g/dL — ABNORMAL LOW (ref 13.0–17.0)
Hemoglobin: 11.2 g/dL — ABNORMAL LOW (ref 13.0–17.0)
Hemoglobin: 10.9 g/dL — ABNORMAL LOW (ref 13.0–17.0)
Hemoglobin: 10.9 g/dL — ABNORMAL LOW (ref 13.0–17.0)
O2 Saturation: 100 %
O2 Saturation: 99 %
O2 Saturation: 95 %
O2 Saturation: 94 %
O2 Saturation: 90 %
Patient temperature: 36.1
Patient temperature: 37.1
Patient temperature: 37.2
Potassium: 5.3 mmol/L — ABNORMAL HIGH (ref 3.5–5.1)
Potassium: 4.3 mmol/L (ref 3.5–5.1)
Potassium: 4.2 mmol/L (ref 3.5–5.1)
Potassium: 4.6 mmol/L (ref 3.5–5.1)
Potassium: 4.7 mmol/L (ref 3.5–5.1)
Sodium: 136 mmol/L (ref 135–145)
Sodium: 140 mmol/L (ref 135–145)
Sodium: 141 mmol/L (ref 135–145)
Sodium: 140 mmol/L (ref 135–145)
Sodium: 139 mmol/L (ref 135–145)
TCO2: 30 mmol/L (ref 22–32)
TCO2: 24 mmol/L (ref 22–32)
TCO2: 25 mmol/L (ref 22–32)
TCO2: 24 mmol/L (ref 22–32)
TCO2: 22 mmol/L (ref 22–32)
pCO2 arterial: 50.1 mmHg — ABNORMAL HIGH (ref 32.0–48.0)
pCO2 arterial: 41.2 mmHg (ref 32.0–48.0)
pCO2 arterial: 42.5 mmHg (ref 32.0–48.0)
pCO2 arterial: 38.7 mmHg (ref 32.0–48.0)
pCO2 arterial: 36.2 mmHg (ref 32.0–48.0)
pH, Arterial: 7.363 (ref 7.350–7.450)
pH, Arterial: 7.357 (ref 7.350–7.450)
pH, Arterial: 7.356 (ref 7.350–7.450)
pH, Arterial: 7.37 (ref 7.350–7.450)
pH, Arterial: 7.371 (ref 7.350–7.450)
pO2, Arterial: 461 mmHg — ABNORMAL HIGH (ref 83.0–108.0)
pO2, Arterial: 138 mmHg — ABNORMAL HIGH (ref 83.0–108.0)
pO2, Arterial: 77 mmHg — ABNORMAL LOW (ref 83.0–108.0)
pO2, Arterial: 72 mmHg — ABNORMAL LOW (ref 83.0–108.0)
pO2, Arterial: 60 mmHg — ABNORMAL LOW (ref 83.0–108.0)

## 2019-03-01 LAB — GLUCOSE, CAPILLARY
Glucose-Capillary: 119 mg/dL — ABNORMAL HIGH (ref 70–99)
Glucose-Capillary: 112 mg/dL — ABNORMAL HIGH (ref 70–99)
Glucose-Capillary: 119 mg/dL — ABNORMAL HIGH (ref 70–99)
Glucose-Capillary: 139 mg/dL — ABNORMAL HIGH (ref 70–99)

## 2019-03-01 LAB — CBC WITH DIFFERENTIAL/PLATELET
Abs Immature Granulocytes: 0.08 10*3/uL — ABNORMAL HIGH (ref 0.00–0.07)
Basophils Absolute: 0 10*3/uL (ref 0.0–0.1)
Basophils Relative: 0 %
Eosinophils Absolute: 0.1 10*3/uL (ref 0.0–0.5)
Eosinophils Relative: 1 %
HCT: 33.3 % — ABNORMAL LOW (ref 39.0–52.0)
Hemoglobin: 11.1 g/dL — ABNORMAL LOW (ref 13.0–17.0)
Immature Granulocytes: 1 %
Lymphocytes Relative: 4 %
Lymphs Abs: 0.4 10*3/uL — ABNORMAL LOW (ref 0.7–4.0)
MCH: 31.6 pg (ref 26.0–34.0)
MCHC: 33.3 g/dL (ref 30.0–36.0)
MCV: 94.9 fL (ref 80.0–100.0)
Monocytes Absolute: 0.5 10*3/uL (ref 0.1–1.0)
Monocytes Relative: 5 %
Neutro Abs: 8.1 10*3/uL — ABNORMAL HIGH (ref 1.7–7.7)
Neutrophils Relative %: 89 %
Platelets: 141 10*3/uL — ABNORMAL LOW (ref 150–400)
RBC: 3.51 MIL/uL — ABNORMAL LOW (ref 4.22–5.81)
RDW: 13.6 % (ref 11.5–15.5)
WBC: 9.2 10*3/uL (ref 4.0–10.5)
nRBC: 0 % (ref 0.0–0.2)

## 2019-03-01 LAB — BASIC METABOLIC PANEL
Anion gap: 7 (ref 5–15)
BUN: 12 mg/dL (ref 8–23)
CO2: 22 mmol/L (ref 22–32)
Calcium: 7.4 mg/dL — ABNORMAL LOW (ref 8.9–10.3)
Chloride: 107 mmol/L (ref 98–111)
Creatinine, Ser: 0.79 mg/dL (ref 0.61–1.24)
GFR calc Af Amer: >60 mL/min (ref >60)
GFR calc non Af Amer: >60 mL/min (ref >60)
Glucose, Bld: 151 mg/dL — ABNORMAL HIGH (ref 70–99)
Potassium: 4.6 mmol/L (ref 3.5–5.1)
Sodium: 136 mmol/L (ref 135–145)

## 2019-03-01 LAB — CBC
HCT: 35 % — ABNORMAL LOW (ref 39.0–52.0)
Hemoglobin: 11.8 g/dL — ABNORMAL LOW (ref 13.0–17.0)
MCH: 32.2 pg (ref 26.0–34.0)
MCHC: 33.7 g/dL (ref 30.0–36.0)
MCV: 95.4 fL (ref 80.0–100.0)
Platelets: 158 10*3/uL (ref 150–400)
RBC: 3.67 MIL/uL — ABNORMAL LOW (ref 4.22–5.81)
RDW: 13.5 % (ref 11.5–15.5)
WBC: 13.8 10*3/uL — ABNORMAL HIGH (ref 4.0–10.5)
nRBC: 0 % (ref 0.0–0.2)

## 2019-03-01 LAB — APTT: aPTT: 29 seconds (ref 24–36)

## 2019-03-01 LAB — PROTIME-INR
INR: 1.5 — ABNORMAL HIGH (ref 0.8–1.2)
Prothrombin Time: 17.6 seconds — ABNORMAL HIGH (ref 11.4–15.2)

## 2019-03-01 LAB — MAGNESIUM: Magnesium: 3 mg/dL — ABNORMAL HIGH (ref 1.7–2.4)

## 2019-03-01 LAB — HEMOGLOBIN AND HEMATOCRIT, BLOOD
HCT: 30.1 % — ABNORMAL LOW (ref 39.0–52.0)
Hemoglobin: 10.2 g/dL — ABNORMAL LOW (ref 13.0–17.0)

## 2019-03-01 LAB — PLATELET COUNT: Platelets: 138 10*3/uL — ABNORMAL LOW (ref 150–400)

## 2019-03-01 SURGERY — CORONARY ARTERY BYPASS GRAFTING (CABG)
Anesthesia: General | Site: Chest

## 2019-03-01 MED ORDER — ALBUMIN HUMAN 5 % IV SOLN
INTRAVENOUS | Status: DC | PRN
  Administered 2019-03-01: 12:00:00 via INTRAVENOUS

## 2019-03-01 MED ORDER — DEXMEDETOMIDINE HCL IN NACL 400 MCG/100ML IV SOLN: INTRAVENOUS | Status: DC | PRN

## 2019-03-01 MED ORDER — INSULIN ASPART 100 UNIT/ML ~~LOC~~ SOLN
0.0000 [IU] | SUBCUTANEOUS | Status: DC
  Administered 2019-03-01 – 2019-03-02 (×2): 2 [IU] via SUBCUTANEOUS

## 2019-03-01 MED ORDER — LACTATED RINGERS IV SOLN
INTRAVENOUS | Status: DC | PRN
  Administered 2019-03-01: 07:00:00 via INTRAVENOUS

## 2019-03-01 MED ORDER — LACTATED RINGERS IV SOLN
INTRAVENOUS | Status: DC | PRN
  Administered 2019-03-01 (×2): via INTRAVENOUS

## 2019-03-01 MED ORDER — SUCCINYLCHOLINE CHLORIDE 200 MG/10ML IV SOSY
PREFILLED_SYRINGE | INTRAVENOUS | Status: AC
  Filled 2019-03-01: qty 10

## 2019-03-01 MED ORDER — METOPROLOL TARTRATE 5 MG/5ML IV SOLN: 2.5000 mg | INTRAVENOUS | Status: DC | PRN

## 2019-03-01 MED ORDER — ACETAMINOPHEN 160 MG/5ML PO SOLN: 1000.0000 mg | Freq: Four times a day (QID) | ORAL | Status: DC

## 2019-03-01 MED ORDER — FENTANYL CITRATE (PF) 250 MCG/5ML IJ SOLN
INTRAMUSCULAR | Status: AC
  Filled 2019-03-01: qty 25

## 2019-03-01 MED ORDER — SODIUM CHLORIDE 0.45 % IV SOLN: INTRAVENOUS | Status: DC | PRN

## 2019-03-01 MED ORDER — THROMBIN 20000 UNITS EX SOLR
CUTANEOUS | Status: DC | PRN
  Administered 2019-03-01: 20000 [IU] via TOPICAL

## 2019-03-01 MED ORDER — PROTAMINE SULFATE 10 MG/ML IV SOLN
INTRAVENOUS | Status: AC
  Filled 2019-03-01: qty 5

## 2019-03-01 MED ORDER — THROMBIN (RECOMBINANT) 20000 UNITS EX SOLR
CUTANEOUS | Status: AC
  Filled 2019-03-01: qty 20000

## 2019-03-01 MED ORDER — NITROGLYCERIN IN D5W 200-5 MCG/ML-% IV SOLN
0.0000 ug/min | INTRAVENOUS | Status: DC
  Administered 2019-03-01: 5 ug/min via INTRAVENOUS

## 2019-03-01 MED ORDER — BISACODYL 5 MG PO TBEC
10.0000 mg | DELAYED_RELEASE_TABLET | Freq: Every day | ORAL | Status: DC
  Administered 2019-03-02 – 2019-03-03 (×2): 10 mg via ORAL
  Filled 2019-03-01 (×5): qty 2

## 2019-03-01 MED ORDER — OXYCODONE HCL 5 MG PO TABS
5.0000 mg | ORAL_TABLET | ORAL | Status: DC | PRN
  Administered 2019-03-01 – 2019-03-03 (×2): 5 mg via ORAL
  Filled 2019-03-01 (×2): qty 1

## 2019-03-01 MED ORDER — MAGNESIUM SULFATE 4 GM/100ML IV SOLN
4.0000 g | Freq: Once | INTRAVENOUS | Status: AC
  Administered 2019-03-01: 4 g via INTRAVENOUS
  Filled 2019-03-01: qty 100

## 2019-03-01 MED ORDER — ROCURONIUM BROMIDE 10 MG/ML (PF) SYRINGE
PREFILLED_SYRINGE | INTRAVENOUS | Status: AC
  Filled 2019-03-01: qty 10

## 2019-03-01 MED ORDER — HEMOSTATIC AGENTS (NO CHARGE) OPTIME
TOPICAL | Status: DC | PRN
  Administered 2019-03-01: 1 via TOPICAL

## 2019-03-01 MED ORDER — EPHEDRINE 5 MG/ML INJ
INTRAVENOUS | Status: AC
  Filled 2019-03-01: qty 10

## 2019-03-01 MED ORDER — BISACODYL 10 MG RE SUPP: 10.0000 mg | Freq: Every day | RECTAL | Status: DC

## 2019-03-01 MED ORDER — SODIUM CHLORIDE 0.9 % IV SOLN: INTRAVENOUS | Status: DC | PRN

## 2019-03-01 MED ORDER — SODIUM CHLORIDE 0.9% FLUSH: 3.0000 mL | INTRAVENOUS | Status: DC | PRN

## 2019-03-01 MED ORDER — PHENYLEPHRINE 40 MCG/ML (10ML) SYRINGE FOR IV PUSH (FOR BLOOD PRESSURE SUPPORT)
PREFILLED_SYRINGE | INTRAVENOUS | Status: AC
  Filled 2019-03-01: qty 10

## 2019-03-01 MED ORDER — MORPHINE SULFATE (PF) 2 MG/ML IV SOLN
1.0000 mg | INTRAVENOUS | Status: DC | PRN
  Administered 2019-03-01: 2 mg via INTRAVENOUS
  Administered 2019-03-01: 1 mg via INTRAVENOUS
  Administered 2019-03-01 (×2): 2 mg via INTRAVENOUS
  Administered 2019-03-02 (×2): 4 mg via INTRAVENOUS
  Administered 2019-03-02 (×4): 2 mg via INTRAVENOUS
  Filled 2019-03-01 (×4): qty 1
  Filled 2019-03-01 (×2): qty 2
  Filled 2019-03-01 (×5): qty 1

## 2019-03-01 MED ORDER — MIDAZOLAM HCL (PF) 10 MG/2ML IJ SOLN
INTRAMUSCULAR | Status: AC
  Filled 2019-03-01: qty 2

## 2019-03-01 MED ORDER — SODIUM CHLORIDE 0.9% FLUSH: 10.0000 mL | INTRAVENOUS | Status: DC | PRN

## 2019-03-01 MED ORDER — TAMSULOSIN HCL 0.4 MG PO CAPS
0.8000 mg | ORAL_CAPSULE | Freq: Every day | ORAL | Status: DC
  Administered 2019-03-02 – 2019-03-05 (×4): 0.8 mg via ORAL
  Filled 2019-03-01 (×4): qty 2

## 2019-03-01 MED ORDER — ROCURONIUM BROMIDE 10 MG/ML (PF) SYRINGE
PREFILLED_SYRINGE | INTRAVENOUS | Status: DC | PRN
  Administered 2019-03-01: 100 mg via INTRAVENOUS
  Administered 2019-03-01: 20 mg via INTRAVENOUS
  Administered 2019-03-01: 100 mg via INTRAVENOUS

## 2019-03-01 MED ORDER — INSULIN REGULAR(HUMAN) IN NACL 100-0.9 UT/100ML-% IV SOLN: INTRAVENOUS | Status: DC

## 2019-03-01 MED ORDER — MUPIROCIN CALCIUM 2 % NA OINT
1.0000 "application " | TOPICAL_OINTMENT | Freq: Two times a day (BID) | NASAL | Status: DC
  Filled 2019-03-01: qty 1

## 2019-03-01 MED ORDER — POTASSIUM CHLORIDE 10 MEQ/50ML IV SOLN: 10.0000 meq | INTRAVENOUS | Status: AC

## 2019-03-01 MED ORDER — SODIUM CHLORIDE 0.9% FLUSH
10.0000 mL | Freq: Two times a day (BID) | INTRAVENOUS | Status: DC
  Administered 2019-03-01 – 2019-03-05 (×5): 10 mL

## 2019-03-01 MED ORDER — TRANEXAMIC ACID 1000 MG/10ML IV SOLN: INTRAVENOUS | Status: DC | PRN

## 2019-03-01 MED ORDER — ASPIRIN 81 MG PO CHEW
324.0000 mg | CHEWABLE_TABLET | Freq: Every day | ORAL | Status: DC
  Filled 2019-03-01 (×2): qty 4

## 2019-03-01 MED ORDER — SODIUM CHLORIDE 0.9 % IV SOLN
INTRAVENOUS | Status: DC | PRN
  Administered 2019-03-01: 13:00:00 via INTRAVENOUS

## 2019-03-01 MED ORDER — TRAMADOL HCL 50 MG PO TABS
50.0000 mg | ORAL_TABLET | ORAL | Status: DC | PRN
  Administered 2019-03-02: 12:00:00 50 mg via ORAL
  Filled 2019-03-01: qty 1

## 2019-03-01 MED ORDER — MIDAZOLAM HCL 2 MG/2ML IJ SOLN: 2.0000 mg | INTRAMUSCULAR | Status: DC | PRN

## 2019-03-01 MED ORDER — SODIUM CHLORIDE (PF) 0.9 % IJ SOLN
OROMUCOSAL | Status: DC | PRN
  Administered 2019-03-01 (×3): 4 mL via TOPICAL

## 2019-03-01 MED ORDER — SODIUM CHLORIDE (PF) 0.9 % IJ SOLN
INTRAMUSCULAR | Status: AC
  Filled 2019-03-01: qty 10

## 2019-03-01 MED ORDER — ACETAMINOPHEN 160 MG/5ML PO SOLN: 650.0000 mg | Freq: Once | ORAL | Status: AC

## 2019-03-01 MED ORDER — CHLORHEXIDINE GLUCONATE CLOTH 2 % EX PADS
6.0000 | MEDICATED_PAD | Freq: Every day | CUTANEOUS | Status: DC
  Administered 2019-03-06: 6 via TOPICAL

## 2019-03-01 MED ORDER — PHENYLEPHRINE HCL-NACL 20-0.9 MG/250ML-% IV SOLN: 0.0000 ug/min | INTRAVENOUS | Status: DC

## 2019-03-01 MED ORDER — ACETAMINOPHEN 500 MG PO TABS
1000.0000 mg | ORAL_TABLET | Freq: Four times a day (QID) | ORAL | Status: DC
  Administered 2019-03-01 – 2019-03-06 (×17): 1000 mg via ORAL
  Filled 2019-03-01 (×18): qty 2

## 2019-03-01 MED ORDER — SODIUM CHLORIDE 0.9 % IV SOLN
INTRAVENOUS | Status: DC | PRN
  Administered 2019-03-01: 10 ug/min via INTRAVENOUS

## 2019-03-01 MED ORDER — METOPROLOL TARTRATE 12.5 MG HALF TABLET
12.5000 mg | ORAL_TABLET | Freq: Once | ORAL | Status: AC
  Administered 2019-03-01: 12.5 mg via ORAL
  Filled 2019-03-01: qty 1

## 2019-03-01 MED ORDER — LACTATED RINGERS IV SOLN: INTRAVENOUS | Status: DC

## 2019-03-01 MED ORDER — DEXMEDETOMIDINE HCL IN NACL 200 MCG/50ML IV SOLN
0.0000 ug/kg/h | INTRAVENOUS | Status: DC
  Administered 2019-03-01 (×2): 0.2 ug/kg/h via INTRAVENOUS
  Filled 2019-03-01: qty 50

## 2019-03-01 MED ORDER — INSULIN REGULAR BOLUS VIA INFUSION
0.0000 [IU] | Freq: Three times a day (TID) | INTRAVENOUS | Status: DC
  Filled 2019-03-01: qty 10

## 2019-03-01 MED ORDER — PROTAMINE SULFATE 10 MG/ML IV SOLN
INTRAVENOUS | Status: DC | PRN
  Administered 2019-03-01: 300 mg via INTRAVENOUS

## 2019-03-01 MED ORDER — SODIUM CHLORIDE 0.9 % IV SOLN
INTRAVENOUS | Status: DC
  Administered 2019-03-01: 14:00:00 via INTRAVENOUS

## 2019-03-01 MED ORDER — CHLORHEXIDINE GLUCONATE 4 % EX LIQD: 30.0000 mL | CUTANEOUS | Status: DC

## 2019-03-01 MED ORDER — HEPARIN SODIUM (PORCINE) 1000 UNIT/ML IJ SOLN
INTRAMUSCULAR | Status: AC
  Filled 2019-03-01: qty 1

## 2019-03-01 MED ORDER — SODIUM CHLORIDE 0.9 % IV SOLN: 250.0000 mL | INTRAVENOUS | Status: DC

## 2019-03-01 MED ORDER — CHLORHEXIDINE GLUCONATE 0.12 % MT SOLN
15.0000 mL | OROMUCOSAL | Status: AC
  Administered 2019-03-01: 15 mL via OROMUCOSAL

## 2019-03-01 MED ORDER — ROCURONIUM BROMIDE 100 MG/10ML IV SOLN: INTRAVENOUS | Status: DC | PRN

## 2019-03-01 MED ORDER — SODIUM CHLORIDE 0.9 % IV SOLN
1.5000 g | Freq: Two times a day (BID) | INTRAVENOUS | Status: AC
  Administered 2019-03-01 – 2019-03-03 (×4): 1.5 g via INTRAVENOUS
  Filled 2019-03-01 (×4): qty 1.5

## 2019-03-01 MED ORDER — COLESTIPOL HCL 1 G PO TABS
1.0000 g | ORAL_TABLET | Freq: Two times a day (BID) | ORAL | Status: DC
  Administered 2019-03-02 – 2019-03-06 (×9): 1 g via ORAL
  Filled 2019-03-01 (×11): qty 1

## 2019-03-01 MED ORDER — PROTAMINE SULFATE 10 MG/ML IV SOLN
INTRAVENOUS | Status: AC
  Filled 2019-03-01: qty 25

## 2019-03-01 MED ORDER — VANCOMYCIN HCL IN DEXTROSE 1-5 GM/200ML-% IV SOLN
1000.0000 mg | Freq: Once | INTRAVENOUS | Status: AC
  Administered 2019-03-01: 1000 mg via INTRAVENOUS
  Filled 2019-03-01: qty 200

## 2019-03-01 MED ORDER — ACETAMINOPHEN 650 MG RE SUPP
650.0000 mg | Freq: Once | RECTAL | Status: AC
  Administered 2019-03-01: 650 mg via RECTAL

## 2019-03-01 MED ORDER — PANTOPRAZOLE SODIUM 40 MG PO TBEC
40.0000 mg | DELAYED_RELEASE_TABLET | Freq: Every day | ORAL | Status: DC
  Administered 2019-03-02 – 2019-03-06 (×4): 40 mg via ORAL
  Filled 2019-03-01 (×5): qty 1

## 2019-03-01 MED ORDER — PROPOFOL 10 MG/ML IV BOLUS
INTRAVENOUS | Status: AC
  Filled 2019-03-01: qty 20

## 2019-03-01 MED ORDER — LACTATED RINGERS IV SOLN: 500.0000 mL | Freq: Once | INTRAVENOUS | Status: DC | PRN

## 2019-03-01 MED ORDER — ALBUMIN HUMAN 5 % IV SOLN
250.0000 mL | INTRAVENOUS | Status: AC | PRN
  Administered 2019-03-01 (×4): 12.5 g via INTRAVENOUS
  Filled 2019-03-01: qty 500

## 2019-03-01 MED ORDER — PROPOFOL 10 MG/ML IV BOLUS
INTRAVENOUS | Status: DC | PRN
  Administered 2019-03-01: 100 mg via INTRAVENOUS

## 2019-03-01 MED ORDER — MIDAZOLAM HCL 5 MG/5ML IJ SOLN
INTRAMUSCULAR | Status: DC | PRN
  Administered 2019-03-01 (×2): 3 mg via INTRAVENOUS
  Administered 2019-03-01 (×2): 2 mg via INTRAVENOUS

## 2019-03-01 MED ORDER — ASPIRIN EC 325 MG PO TBEC
325.0000 mg | DELAYED_RELEASE_TABLET | Freq: Every day | ORAL | Status: DC
  Administered 2019-03-02 – 2019-03-06 (×5): 325 mg via ORAL
  Filled 2019-03-01 (×5): qty 1

## 2019-03-01 MED ORDER — METOPROLOL TARTRATE 12.5 MG HALF TABLET
12.5000 mg | ORAL_TABLET | Freq: Two times a day (BID) | ORAL | Status: DC
  Administered 2019-03-02 – 2019-03-06 (×7): 12.5 mg via ORAL
  Filled 2019-03-01 (×8): qty 1

## 2019-03-01 MED ORDER — FAMOTIDINE IN NACL 20-0.9 MG/50ML-% IV SOLN
20.0000 mg | Freq: Two times a day (BID) | INTRAVENOUS | Status: AC
  Administered 2019-03-01 (×2): 20 mg via INTRAVENOUS
  Filled 2019-03-01: qty 50

## 2019-03-01 MED ORDER — FLUTICASONE FUROATE-VILANTEROL 100-25 MCG/INH IN AEPB
1.0000 | INHALATION_SPRAY | Freq: Every day | RESPIRATORY_TRACT | Status: DC
  Administered 2019-03-03 – 2019-03-06 (×3): 1 via RESPIRATORY_TRACT
  Filled 2019-03-01 (×2): qty 28

## 2019-03-01 MED ORDER — NITROGLYCERIN IN D5W 200-5 MCG/ML-% IV SOLN: INTRAVENOUS | Status: DC | PRN

## 2019-03-01 MED ORDER — SODIUM CHLORIDE 0.9% FLUSH
3.0000 mL | Freq: Two times a day (BID) | INTRAVENOUS | Status: DC
  Administered 2019-03-02 – 2019-03-06 (×8): 3 mL via INTRAVENOUS

## 2019-03-01 MED ORDER — FENTANYL CITRATE (PF) 250 MCG/5ML IJ SOLN
INTRAMUSCULAR | Status: DC | PRN
  Administered 2019-03-01 (×2): 50 ug via INTRAVENOUS
  Administered 2019-03-01: 150 ug via INTRAVENOUS
  Administered 2019-03-01: 200 ug via INTRAVENOUS
  Administered 2019-03-01: 250 ug via INTRAVENOUS
  Administered 2019-03-01: 50 ug via INTRAVENOUS
  Administered 2019-03-01 (×2): 100 ug via INTRAVENOUS
  Administered 2019-03-01: 50 ug via INTRAVENOUS
  Administered 2019-03-01: 250 ug via INTRAVENOUS

## 2019-03-01 MED ORDER — ONDANSETRON HCL 4 MG/2ML IJ SOLN
4.0000 mg | Freq: Four times a day (QID) | INTRAMUSCULAR | Status: DC | PRN
  Filled 2019-03-01: qty 2

## 2019-03-01 MED ORDER — DEXMEDETOMIDINE HCL IN NACL 200 MCG/50ML IV SOLN
INTRAVENOUS | Status: AC
  Filled 2019-03-01: qty 50

## 2019-03-01 MED ORDER — SODIUM CHLORIDE 0.9 % IV SOLN
INTRAVENOUS | Status: DC | PRN
  Administered 2019-03-01: 750 mg via INTRAVENOUS

## 2019-03-01 MED ORDER — CHLORHEXIDINE GLUCONATE 0.12 % MT SOLN
15.0000 mL | Freq: Once | OROMUCOSAL | Status: AC
  Administered 2019-03-01: 15 mL via OROMUCOSAL
  Filled 2019-03-01: qty 15

## 2019-03-01 MED ORDER — DOCUSATE SODIUM 100 MG PO CAPS
200.0000 mg | ORAL_CAPSULE | Freq: Every day | ORAL | Status: DC
  Administered 2019-03-02 – 2019-03-04 (×3): 200 mg via ORAL
  Filled 2019-03-01 (×5): qty 2

## 2019-03-01 MED ORDER — METOPROLOL TARTRATE 25 MG/10 ML ORAL SUSPENSION
12.5000 mg | Freq: Two times a day (BID) | ORAL | Status: DC
  Filled 2019-03-01 (×6): qty 5

## 2019-03-01 MED ORDER — HEPARIN SODIUM (PORCINE) 1000 UNIT/ML IJ SOLN
INTRAMUSCULAR | Status: DC | PRN
  Administered 2019-03-01: 30000 [IU] via INTRAVENOUS

## 2019-03-01 MED FILL — Potassium Chloride Inj 2 mEq/ML: INTRAVENOUS | Qty: 40 | Status: AC

## 2019-03-01 MED FILL — Magnesium Sulfate Inj 50%: INTRAMUSCULAR | Qty: 10 | Status: AC

## 2019-03-01 MED FILL — Heparin Sodium (Porcine) Inj 1000 Unit/ML: INTRAMUSCULAR | Qty: 30 | Status: AC

## 2019-03-01 SURGICAL SUPPLY — 104 items
BAG DECANTER FOR FLEXI CONT (MISCELLANEOUS) ×3 IMPLANT
BANDAGE ACE 4X5 VEL STRL LF (GAUZE/BANDAGES/DRESSINGS) ×3 IMPLANT
BANDAGE ACE 6X5 VEL STRL LF (GAUZE/BANDAGES/DRESSINGS) ×3 IMPLANT
BANDAGE ELASTIC 4 VELCRO ST LF (GAUZE/BANDAGES/DRESSINGS) ×3 IMPLANT
BANDAGE ELASTIC 6 VELCRO ST LF (GAUZE/BANDAGES/DRESSINGS) ×3 IMPLANT
BASKET HEART (ORDER IN 25'S) (MISCELLANEOUS) ×1
BASKET HEART (ORDER IN 25S) (MISCELLANEOUS) ×2 IMPLANT
BLADE STERNUM SYSTEM 6 (BLADE) ×3 IMPLANT
BNDG GAUZE ELAST 4 BULKY (GAUZE/BANDAGES/DRESSINGS) ×3 IMPLANT
CANISTER SUCT 3000ML PPV (MISCELLANEOUS) ×3 IMPLANT
CATH ROBINSON RED A/P 18FR (CATHETERS) ×6 IMPLANT
CATH THORACIC 28FR (CATHETERS) ×3 IMPLANT
CATH THORACIC 36FR (CATHETERS) ×3 IMPLANT
CATH THORACIC 36FR RT ANG (CATHETERS) ×3 IMPLANT
CLIP VESOCCLUDE MED 24/CT (CLIP) IMPLANT
CLIP VESOCCLUDE SM WIDE 24/CT (CLIP) ×15 IMPLANT
COVER WAND RF STERILE (DRAPES) ×3 IMPLANT
CRADLE DONUT ADULT HEAD (MISCELLANEOUS) ×3 IMPLANT
DRAPE CARDIOVASCULAR INCISE (DRAPES) ×1
DRAPE SLUSH/WARMER DISC (DRAPES) ×3 IMPLANT
DRAPE SRG 135X102X78XABS (DRAPES) ×2 IMPLANT
DRSG COVADERM 4X14 (GAUZE/BANDAGES/DRESSINGS) ×3 IMPLANT
ELECT CAUTERY BLADE 6.4 (BLADE) ×3 IMPLANT
ELECT REM PT RETURN 9FT ADLT (ELECTROSURGICAL) ×6
ELECTRODE REM PT RTRN 9FT ADLT (ELECTROSURGICAL) ×4 IMPLANT
FELT TEFLON 1X6 (MISCELLANEOUS) ×3 IMPLANT
GAUZE SPONGE 4X4 12PLY STRL (GAUZE/BANDAGES/DRESSINGS) ×6 IMPLANT
GLOVE BIO SURGEON STRL SZ 6 (GLOVE) IMPLANT
GLOVE BIO SURGEON STRL SZ 6.5 (GLOVE) IMPLANT
GLOVE BIO SURGEON STRL SZ7 (GLOVE) IMPLANT
GLOVE BIO SURGEON STRL SZ7.5 (GLOVE) IMPLANT
GLOVE BIOGEL PI IND STRL 6 (GLOVE) ×2 IMPLANT
GLOVE BIOGEL PI IND STRL 6.5 (GLOVE) IMPLANT
GLOVE BIOGEL PI IND STRL 7.0 (GLOVE) IMPLANT
GLOVE BIOGEL PI INDICATOR 6 (GLOVE) ×1
GLOVE BIOGEL PI INDICATOR 6.5 (GLOVE)
GLOVE BIOGEL PI INDICATOR 7.0 (GLOVE)
GLOVE EUDERMIC 7 POWDERFREE (GLOVE) ×6 IMPLANT
GLOVE ORTHO TXT STRL SZ7.5 (GLOVE) IMPLANT
GOWN STRL REUS W/ TWL LRG LVL3 (GOWN DISPOSABLE) ×20 IMPLANT
GOWN STRL REUS W/ TWL XL LVL3 (GOWN DISPOSABLE) ×2 IMPLANT
GOWN STRL REUS W/TWL LRG LVL3 (GOWN DISPOSABLE) ×10
GOWN STRL REUS W/TWL XL LVL3 (GOWN DISPOSABLE) ×1
HEMOSTAT POWDER SURGIFOAM 1G (HEMOSTASIS) ×9 IMPLANT
HEMOSTAT SURGICEL 2X14 (HEMOSTASIS) ×3 IMPLANT
INSERT FOGARTY 61MM (MISCELLANEOUS) IMPLANT
INSERT FOGARTY XLG (MISCELLANEOUS) IMPLANT
KIT BASIN OR (CUSTOM PROCEDURE TRAY) ×3 IMPLANT
KIT CATH CPB BARTLE (MISCELLANEOUS) ×3 IMPLANT
KIT SUCTION CATH 14FR (SUCTIONS) ×3 IMPLANT
KIT TURNOVER KIT B (KITS) ×3 IMPLANT
KIT VASOVIEW HEMOPRO 2 VH 4000 (KITS) ×3 IMPLANT
NS IRRIG 1000ML POUR BTL (IV SOLUTION) ×15 IMPLANT
PACK E OPEN HEART (SUTURE) ×3 IMPLANT
PACK OPEN HEART (CUSTOM PROCEDURE TRAY) ×3 IMPLANT
PAD ARMBOARD 7.5X6 YLW CONV (MISCELLANEOUS) ×6 IMPLANT
PAD ELECT DEFIB RADIOL ZOLL (MISCELLANEOUS) ×3 IMPLANT
PENCIL BUTTON HOLSTER BLD 10FT (ELECTRODE) ×3 IMPLANT
PUNCH AORTIC ROTATE 4.0MM (MISCELLANEOUS) IMPLANT
PUNCH AORTIC ROTATE 4.5MM 8IN (MISCELLANEOUS) ×3 IMPLANT
PUNCH AORTIC ROTATE 5MM 8IN (MISCELLANEOUS) IMPLANT
SET CARDIOPLEGIA MPS 5001102 (MISCELLANEOUS) ×3 IMPLANT
SPONGE INTESTINAL PEANUT (DISPOSABLE) IMPLANT
SPONGE LAP 18X18 RF (DISPOSABLE) IMPLANT
SPONGE LAP 4X18 RFD (DISPOSABLE) ×6 IMPLANT
SUT BONE WAX W31G (SUTURE) ×3 IMPLANT
SUT MNCRL AB 4-0 PS2 18 (SUTURE) ×3 IMPLANT
SUT PROLENE 3 0 SH DA (SUTURE) IMPLANT
SUT PROLENE 3 0 SH1 36 (SUTURE) ×3 IMPLANT
SUT PROLENE 4 0 RB 1 (SUTURE)
SUT PROLENE 4 0 SH DA (SUTURE) IMPLANT
SUT PROLENE 4-0 RB1 .5 CRCL 36 (SUTURE) IMPLANT
SUT PROLENE 5 0 C 1 36 (SUTURE) IMPLANT
SUT PROLENE 6 0 C 1 30 (SUTURE) ×12 IMPLANT
SUT PROLENE 7 0 BV 1 (SUTURE) IMPLANT
SUT PROLENE 7 0 BV1 MDA (SUTURE) ×6 IMPLANT
SUT PROLENE 8 0 BV175 6 (SUTURE) IMPLANT
SUT SILK  1 MH (SUTURE)
SUT SILK 1 MH (SUTURE) IMPLANT
SUT SILK 2 0 SH (SUTURE) IMPLANT
SUT SILK 2 0 SH CR/8 (SUTURE) ×6 IMPLANT
SUT STEEL STERNAL CCS#1 18IN (SUTURE) IMPLANT
SUT STEEL SZ 6 DBL 3X14 BALL (SUTURE) IMPLANT
SUT VIC AB 1 CTX 36 (SUTURE) ×2
SUT VIC AB 1 CTX36XBRD ANBCTR (SUTURE) ×4 IMPLANT
SUT VIC AB 2-0 CT1 18 (SUTURE) ×3 IMPLANT
SUT VIC AB 2-0 CT1 27 (SUTURE)
SUT VIC AB 2-0 CT1 TAPERPNT 27 (SUTURE) IMPLANT
SUT VIC AB 2-0 CTX 27 (SUTURE) IMPLANT
SUT VIC AB 3-0 SH 27 (SUTURE)
SUT VIC AB 3-0 SH 27X BRD (SUTURE) IMPLANT
SUT VIC AB 3-0 X1 27 (SUTURE) IMPLANT
SUT VICRYL 4-0 PS2 18IN ABS (SUTURE) IMPLANT
SYSTEM SAHARA CHEST DRAIN ATS (WOUND CARE) ×3 IMPLANT
TAPE CLOTH SOFT 2X10 (GAUZE/BANDAGES/DRESSINGS) ×3 IMPLANT
TAPE CLOTH SURG 4X10 WHT LF (GAUZE/BANDAGES/DRESSINGS) ×6 IMPLANT
TAPE PAPER 2X10 WHT MICROPORE (GAUZE/BANDAGES/DRESSINGS) ×3 IMPLANT
TOWEL GREEN STERILE (TOWEL DISPOSABLE) ×3 IMPLANT
TOWEL GREEN STERILE FF (TOWEL DISPOSABLE) ×3 IMPLANT
TRAY FOLEY SLVR 16FR TEMP STAT (SET/KITS/TRAYS/PACK) ×3 IMPLANT
TUBE SUCT INTRACARD DLP 20F (MISCELLANEOUS) ×3 IMPLANT
TUBING LAP HI FLOW INSUFFLATIO (TUBING) ×3 IMPLANT
UNDERPAD 30X30 (UNDERPADS AND DIAPERS) ×3 IMPLANT
WATER STERILE IRR 1000ML POUR (IV SOLUTION) ×6 IMPLANT

## 2019-03-01 NOTE — Plan of Care (Signed)
Pt progressing, currently weening in order to extubate.

## 2019-03-01 NOTE — Transfer of Care (Signed)
Immediate Anesthesia Transfer of Care Note  Patient: Benjamin [name]  Procedure(s) Performed: CORONARY ARTERY BYPASS GRAFTING (CABG) x 4, ON PUMP, USING LEFT INTERNAL MAMMARY ARTERY AND RIGHT GREATER SAPHENOUS VEIN HARVESTED ENDOSCOPICALLY (N/A Chest) TRANSESOPHAGEAL ECHOCARDIOGRAM (TEE) (N/A )  Patient Location: SICU  Anesthesia Type:General  Level of Consciousness: sedated and Patient remains intubated per anesthesia plan  Airway & Oxygen Therapy: Patient remains intubated per anesthesia plan and Patient placed on Ventilator (see vital sign flow sheet for setting)  Post-op Assessment: Report given to RN and Post -op Vital signs reviewed and stable  Post vital signs: Reviewed and stable  Last Vitals:  Vitals Value Taken Time  BP    Temp 36.4 C 03/01/19 1335  Pulse 90 03/01/19 1335  Resp 12 03/01/19 1335  SpO2 92 % 03/01/19 1335  Vitals shown include unvalidated device data.  Last Pain:  Vitals:   03/01/19 0549  TempSrc: Oral         Complications: No apparent anesthesia complications
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
## 2019-03-01 NOTE — Progress Notes (Signed)
[facility] BRIEF SICU PROGRESS NOTE  Day of Surgery  S/P Procedure(s) (LRB): CORONARY ARTERY BYPASS GRAFTING (CABG) x 4, ON PUMP, USING LEFT INTERNAL MAMMARY ARTERY AND RIGHT GREATER SAPHENOUS VEIN HARVESTED ENDOSCOPICALLY (N/A) TRANSESOPHAGEAL ECHOCARDIOGRAM (TEE) (N/A)   Extubated uneventfully AV paced w/ stable hemodynamics on NTG for hypertension Breathing comfortably w/ O2 sats 94% Chest tube output low UOP adequate Labs okay  Plan: Continue routine early postop  Rexene Alberts, MD 03/01/2019 6:06 PM

## 2019-03-01 NOTE — Brief Op Note (Signed)
03/01/2019  11:26 AM  PATIENT:  Benjamin [name]  78 y.o. male  PRE-OPERATIVE DIAGNOSIS:  CAD  POST-OPERATIVE DIAGNOSIS:  CAD  PROCEDURE: TRANSESOPHAGEAL ECHOCARDIOGRAM (TEE), MEDIAN STERNOTOMY for CORONARY ARTERY BYPASS GRAFTING (CABG) x 3  (LIMA to LAD, SVG to DIAGONAL, SVG to OM, SVG to RCA) USING LEFT INTERNAL MAMMARY ARTERY AND RIGHT GREATER SAPHENOUS VEIN HARVESTED ENDOSCOPICALLY  SURGEON:  Surgeon(s) and Role:    [name], MD - Primary  PHYSICIAN ASSISTANT: Lars Pinks PA-C  ASSISTANTS: Dineen Kid RNFA  ANESTHESIA:   general  EBL:  Per perfusion and anesthesia record  DRAINS: Chest tubes placed in the mediastinal and pleural spaces   COUNTS CORRECT:  YES  DICTATION: .Dragon Dictation  PLAN OF CARE: Admit to inpatient   PATIENT DISPOSITION:  ICU - intubated and hemodynamically stable.   Delay start of Pharmacological VTE agent (>24hrs) due to surgical blood loss or risk of bleeding: yes  BASELINE WEIGHT: 85 kg

## 2019-03-01 NOTE — Op Note (Signed)
CARDIOVASCULAR SURGERY OPERATIVE NOTE  03/01/2019  Surgeon:  [name], MD  First Assistant: Lars Pinks,  PA-C   Preoperative Diagnosis:  Severe multi-vessel coronary artery disease   Postoperative Diagnosis:  Same   Procedure:  1. Median Sternotomy 2. Extracorporeal circulation 3.   Coronary artery bypass grafting x 4   Left internal mammary artery graft to the LAD  SVG to diagonal  SVG to OM  SVG to RCA 4.   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 An echocardiogram on 10/23/2018 showed normal left ventricular systolic function with ejection fraction 55 to 60% with impaired diastolic relaxation. There is no significant valvular dysfunction. He has never had any chest pain or pressure. He was seen by Dr. Bronson Ing in March for cardiology evaluation. He underwent a gated cardiac CTA on 01/28/2019 which showed a calcium score of 1374 putting him at the 82nd percentile. There felt to be severe ostial, proximal, and mid LAD stenoses with calcified plaque with possible involvement of the distal left main with moderate stenosis of 50 to 69%. FFR was performed and showed no significant left main stenosis with a FFR of 0.99. There was severe stenosis at the proximal to mid LAD with a proximal FFR of less than 0.50. The mid FFR was less than 0.50, and the distal FFR was less than 0.50. The left circumflex had no significant stenosis with a proximal FFR of 0.97. The RCA had no significant stenosis with a proximal FFR of 0.97 and  distal FFR of 0.90. He underwent cardiac catheterization on 02/19/2019. This showed dense left main and proximal LAD calcification with about 50 to 60% distal left main stenosis. The proximal LAD had 95% stenosis involving a large diagonal. There is about 50% ostial left circumflex stenosis. The right coronary artery had mild mid vessel irregularity but no significant stenosis. I think coronary bypass graft surgery is the best treatment for his coronary stenoses. I reviewed the cardiac catheterization images with the patient and his wife and answered their questions. I discussed the operative procedure with the patient and his wife including alternatives, benefits and risks; including but not limited to bleeding, blood transfusion, infection, stroke, myocardial infarction, graft failure, heart block requiring a permanent pacemaker, organ dysfunction, and death. [name] understands and agrees to proceed.   Preparation:  The patient was seen in the preoperative holding area and the correct patient, correct operation were confirmed with the patient after reviewing the medical record and catheterization. The consent was signed by me. Preoperative antibiotics were given. A pulmonary arterial line and radial arterial line were placed by the anesthesia team. The patient was taken back to the operating room and positioned supine on the operating room table. After being placed under general endotracheal anesthesia by the anesthesia team a foley catheter was placed. The neck, chest, abdomen, and both legs were prepped with betadine soap and solution and draped in the usual sterile manner. A surgical time-out was taken and the correct patient and operative procedure were confirmed with the nursing and anesthesia staff.   Cardiopulmonary Bypass:  A median sternotomy was performed. The pericardium was opened in the midline. Right ventricular function appeared  normal. The ascending aorta was of normal size and had no  palpable plaque. There were no contraindications to aortic cannulation or cross-clamping. The patient was fully systemically heparinized and the ACT was maintained > 400 sec. The proximal aortic arch was cannulated with a 20 F aortic cannula for arterial inflow. Venous cannulation was performed via the right atrial appendage using a two-staged venous cannula. An antegrade cardioplegia/vent cannula was inserted into the mid-ascending aorta. Aortic occlusion was performed with a single cross-clamp. Systemic cooling to 32 degrees Centigrade and topical cooling of the heart with iced saline were used. Hyperkalemic antegrade cold blood cardioplegia was used to induce diastolic arrest and was then given at about 20 minute intervals throughout the period of arrest to maintain myocardial temperature at or below 10 degrees centigrade. A temperature probe was inserted into the interventricular septum and an insulating pad was placed in the pericardium.   Left internal mammary harvest:  The left side of the sternum was retracted using the Rultract retractor. The left internal mammary artery was harvested as a pedicle graft. All side branches were clipped. It was a medium-sized vessel of good quality with excellent blood flow. It was ligated distally and divided. It was sprayed with topical papaverine solution to prevent vasospasm.   Endoscopic vein harvest:  The right greater saphenous vein was harvested endoscopically through a 2 cm incision medial to the right knee. It was harvested from the upper thigh to below the knee. It was a medium-sized vein of good quality. The side branches were all ligated with 4-0 silk ties.    Coronary arteries:  The coronary arteries were examined. There was a large amount of epicardial fat, a large panniculus of fat draped over the left pericardium and fat on the ascending aorta.   LAD:  Intramyocardial proximally. In the mid portion it was visible on the surface but small to  medium caliber. The diagonal was also intramyocardial proximally. Distally it was small but graftable.  LCX:  OM was a medium caliber vessel with no distal disease.  RCA:  Diffusely diseased proximally but the mid to distal vessel was soft with minimal disease.   Grafts:  1. LIMA to the LAD: 1.6 mm in mid portion. It was sewn end to side using 8-0 prolene continuous suture. 2. SVG to diagonal:  1.6 mm. It was sewn end to side using 7-0 prolene continuous suture. 3. SVG to OM:  1.6 mm. It was sewn end to side using 7-0 prolene continuous suture. 4. SVG to mid RCA:  3.0 mm. It was sewn end to side using 7-0 prolene continuous suture.  The proximal vein graft anastomoses were performed to the mid-ascending aorta using continuous 6-0 prolene suture. Graft markers were placed around the proximal anastomoses.   Completion:  The patient was rewarmed to 37 degrees Centigrade. The clamp was removed from the LIMA pedicle and there was rapid warming of the septum and return of ventricular fibrillation. The crossclamp was removed with a time of 96 minutes. There was spontaneous return of sinus rhythm. The distal and proximal anastomoses were checked for hemostasis. The position of the grafts was satisfactory. Two temporary epicardial pacing wires were placed on the right atrium and two on the right ventricle. The patient was weaned from CPB without difficulty on no inotropes. CPB time was 120 minutes. Cardiac output was 4 LPM. TEE showed normal LV systolic function. Heparin was fully reversed with protamine and the aortic and venous cannulas removed. Hemostasis was achieved.  Mediastinal and left pleural drainage tubes were placed. The sternum was closed with  #6 stainless steel wires. The fascia was closed with continuous # 1 vicryl suture. The subcutaneous tissue was closed with 2-0 vicryl continuous suture. The skin was closed with 3-0 vicryl subcuticular suture. All sponge, needle, and instrument counts  were reported correct at the end of the case. Dry sterile dressings were placed over the incisions and around the chest tubes which were connected to pleurevac suction. The patient was then transported to the surgical intensive care unit in stable condition.

## 2019-03-01 NOTE — Anesthesia Procedure Notes (Signed)
Central Venous Catheter Insertion Performed by: Roderic Palau, MD, anesthesiologist Start/End6/15/2020 6:45 AM, 03/01/2019 7:00 AM Patient location: Pre-op. Preanesthetic checklist: patient identified, IV checked, site marked, risks and benefits discussed, surgical consent, monitors and equipment checked, pre-op evaluation, timeout performed and anesthesia consent Position: Trendelenburg Lidocaine 1% used for infiltration and patient sedated Hand hygiene performed , maximum sterile barriers used  and Seldinger technique used Catheter size: 9 Fr Total catheter length 10. Central line was placed.MAC introducer Swan type:thermodilution Procedure performed using ultrasound guided technique. Ultrasound Notes:anatomy identified, needle tip was noted to be adjacent to the nerve/plexus identified, no ultrasound evidence of intravascular and/or intraneural injection and image(s) printed for medical record Attempts: 1 Following insertion, line sutured, dressing applied and Biopatch. Post procedure assessment: blood return through all ports, free fluid flow and no air  Patient tolerated the procedure well with no immediate complications.

## 2019-03-01 NOTE — Anesthesia Procedure Notes (Signed)
Central Venous Catheter Insertion Performed by: Roderic Palau, MD, anesthesiologist Start/End6/15/2020 6:45 AM, 03/01/2019 7:00 AM Patient location: Pre-op. Preanesthetic checklist: patient identified, IV checked, site marked, risks and benefits discussed, surgical consent, monitors and equipment checked, pre-op evaluation, timeout performed and anesthesia consent Hand hygiene performed  and maximum sterile barriers used  PA cath was placed.Swan type:thermodilution PA Cath depth:50 Procedure performed without using ultrasound guided technique. Attempts: 1 Patient tolerated the procedure well with no immediate complications.

## 2019-03-01 NOTE — Interval H&P Note (Signed)
History and Physical Interval Note:  03/01/2019 6:33 AM  Benjamin [name]  has presented today for surgery, with the diagnosis of CAD.  The various methods of treatment have been discussed with the patient and family. After consideration of risks, benefits and other options for treatment, the patient has consented to  Procedure(s): CORONARY ARTERY BYPASS GRAFTING (CABG) (N/A) TRANSESOPHAGEAL ECHOCARDIOGRAM (TEE) (N/A) as a surgical intervention.  The patient's history has been reviewed, patient examined, no change in status, stable for surgery.  I have reviewed the patient's chart and labs.  Questions were answered to the patient's satisfaction.     [name]

## 2019-03-01 NOTE — Anesthesia Postprocedure Evaluation (Signed)
Anesthesia Post Note  Patient: Lincoln Ginley  Procedure(s) Performed: CORONARY ARTERY BYPASS GRAFTING (CABG) x 4, ON PUMP, USING LEFT INTERNAL MAMMARY ARTERY AND RIGHT GREATER SAPHENOUS VEIN HARVESTED ENDOSCOPICALLY (N/A Chest) TRANSESOPHAGEAL ECHOCARDIOGRAM (TEE) (N/A )     Patient location during evaluation: SICU Anesthesia Type: General Level of consciousness: patient remains intubated per anesthesia plan Pain management: pain level controlled Vital Signs Assessment: post-procedure vital signs reviewed and stable Respiratory status: patient remains intubated per anesthesia plan Cardiovascular status: stable Postop Assessment: no apparent nausea or vomiting Anesthetic complications: no    Last Vitals:  Vitals:   03/01/19 1345 03/01/19 1400  BP:    Pulse: 74 80  Resp: 15 12  Temp: (!) 36.2 C (!) 36.1 C  SpO2: 94% 96%    Last Pain:  Vitals:   03/01/19 0549  TempSrc: Oral                 Karman Veney

## 2019-03-01 NOTE — Procedures (Signed)
Extubation Procedure Note  Patient Details:   Name: Benjamin [name] DOB: [birth year] MRN: [id number]   Airway Documentation:    Vent end date: 03/01/19 Vent end time: 1733   Evaluation  O2 sats: stable throughout Complications: No apparent complications Patient did tolerate procedure well. Bilateral Breath Sounds: Clear, Diminished   Patient extubated to 4L [place]. Able to speak his name & good strong cough. NIF/VC along with cuff leak all passed prior to extubation. IS instructed 500x5  Kathie Dike 03/01/2019, 5:41 PM

## 2019-03-01 NOTE — Anesthesia Procedure Notes (Signed)
Arterial Line Insertion Start/End6/14/2020 6:50 AM, 02/28/2019 7:00 AM Performed by: Kyung Rudd, CRNA, CRNA  Preanesthetic checklist: patient identified, IV checked, site marked, risks and benefits discussed, surgical consent, monitors and equipment checked, pre-op evaluation and timeout performed Lidocaine 1% used for infiltration and patient sedated Left, radial was placed Catheter size: 20 G Hand hygiene performed , maximum sterile barriers used  and Seldinger technique used Allen's test indicative of satisfactory collateral circulation Attempts: 1 Procedure performed without using ultrasound guided technique. Following insertion, Biopatch and dressing applied. Post procedure assessment: normal  Patient tolerated the procedure well with no immediate complications.

## 2019-03-01 NOTE — Anesthesia Procedure Notes (Signed)
Procedure Name: Intubation Date/Time: 03/01/2019 7:41 AM Performed by: Kyung Rudd, CRNA Pre-anesthesia Checklist: Patient identified, Emergency Drugs available, Suction available and Patient being monitored Patient Re-evaluated:Patient Re-evaluated prior to induction Oxygen Delivery Method: Circle system utilized Preoxygenation: Pre-oxygenation with 100% oxygen Induction Type: IV induction and Rapid sequence Laryngoscope Size: Mac and 3 Grade View: Grade I Tube type: Oral Tube size: 8.0 mm Number of attempts: 1 Airway Equipment and Method: Stylet Placement Confirmation: ETT inserted through vocal cords under direct vision,  positive ETCO2 and breath sounds checked- equal and bilateral Secured at: 21 cm Tube secured with: Tape Dental Injury: Teeth and Oropharynx as per pre-operative assessment

## 2019-03-02 ENCOUNTER — Encounter (HOSPITAL_COMMUNITY): Payer: Self-pay | Admitting: Surgery

## 2019-03-02 ENCOUNTER — Inpatient Hospital Stay (HOSPITAL_COMMUNITY): Payer: Medicare Other

## 2019-03-02 LAB — BASIC METABOLIC PANEL
Anion gap: 7 (ref 5–15)
Anion gap: 7 (ref 5–15)
BUN: 11 mg/dL (ref 8–23)
BUN: 11 mg/dL (ref 8–23)
CO2: 22 mmol/L (ref 22–32)
CO2: 23 mmol/L (ref 22–32)
Calcium: 7.8 mg/dL — ABNORMAL LOW (ref 8.9–10.3)
Calcium: 8.1 mg/dL — ABNORMAL LOW (ref 8.9–10.3)
Chloride: 105 mmol/L (ref 98–111)
Chloride: 102 mmol/L (ref 98–111)
Creatinine, Ser: 0.82 mg/dL (ref 0.61–1.24)
Creatinine, Ser: 0.82 mg/dL (ref 0.61–1.24)
GFR calc Af Amer: >60 mL/min (ref >60)
GFR calc Af Amer: >60 mL/min (ref >60)
GFR calc non Af Amer: >60 mL/min (ref >60)
GFR calc non Af Amer: >60 mL/min (ref >60)
Glucose, Bld: 120 mg/dL — ABNORMAL HIGH (ref 70–99)
Glucose, Bld: 129 mg/dL — ABNORMAL HIGH (ref 70–99)
Potassium: 4.5 mmol/L (ref 3.5–5.1)
Potassium: 4.1 mmol/L (ref 3.5–5.1)
Sodium: 134 mmol/L — ABNORMAL LOW (ref 135–145)
Sodium: 132 mmol/L — ABNORMAL LOW (ref 135–145)

## 2019-03-02 LAB — POCT I-STAT 7, (LYTES, BLD GAS, ICA,H+H)
Acid-base deficit: 1 mmol/L (ref 0.0–2.0)
Bicarbonate: 23.1 mmol/L (ref 20.0–28.0)
Calcium, Ion: 1.13 mmol/L — ABNORMAL LOW (ref 1.15–1.40)
HCT: 32 % — ABNORMAL LOW (ref 39.0–52.0)
Hemoglobin: 10.9 g/dL — ABNORMAL LOW (ref 13.0–17.0)
O2 Saturation: 94 %
Patient temperature: 37.2
Potassium: 4.5 mmol/L (ref 3.5–5.1)
Sodium: 135 mmol/L (ref 135–145)
TCO2: 24 mmol/L (ref 22–32)
pCO2 arterial: 36.1 mmHg (ref 32.0–48.0)
pH, Arterial: 7.415 (ref 7.350–7.450)
pO2, Arterial: 68 mmHg — ABNORMAL LOW (ref 83.0–108.0)

## 2019-03-02 LAB — GLUCOSE, CAPILLARY
Glucose-Capillary: 107 mg/dL — ABNORMAL HIGH (ref 70–99)
Glucose-Capillary: 107 mg/dL — ABNORMAL HIGH (ref 70–99)
Glucose-Capillary: 116 mg/dL — ABNORMAL HIGH (ref 70–99)
Glucose-Capillary: 117 mg/dL — ABNORMAL HIGH (ref 70–99)
Glucose-Capillary: 119 mg/dL — ABNORMAL HIGH (ref 70–99)
Glucose-Capillary: 125 mg/dL — ABNORMAL HIGH (ref 70–99)
Glucose-Capillary: 154 mg/dL — ABNORMAL HIGH (ref 70–99)

## 2019-03-02 LAB — CBC
HCT: 34.4 % — ABNORMAL LOW (ref 39.0–52.0)
HCT: 34.4 % — ABNORMAL LOW (ref 39.0–52.0)
Hemoglobin: 11.7 g/dL — ABNORMAL LOW (ref 13.0–17.0)
Hemoglobin: 11.5 g/dL — ABNORMAL LOW (ref 13.0–17.0)
MCH: 32.2 pg (ref 26.0–34.0)
MCH: 31.9 pg (ref 26.0–34.0)
MCHC: 34 g/dL (ref 30.0–36.0)
MCHC: 33.4 g/dL (ref 30.0–36.0)
MCV: 94.8 fL (ref 80.0–100.0)
MCV: 95.3 fL (ref 80.0–100.0)
Platelets: 167 10*3/uL (ref 150–400)
Platelets: 148 10*3/uL — ABNORMAL LOW (ref 150–400)
RBC: 3.63 MIL/uL — ABNORMAL LOW (ref 4.22–5.81)
RBC: 3.61 MIL/uL — ABNORMAL LOW (ref 4.22–5.81)
RDW: 13.6 % (ref 11.5–15.5)
RDW: 13.7 % (ref 11.5–15.5)
WBC: 11.1 10*3/uL — ABNORMAL HIGH (ref 4.0–10.5)
WBC: 11.1 10*3/uL — ABNORMAL HIGH (ref 4.0–10.5)
nRBC: 0 % (ref 0.0–0.2)
nRBC: 0 % (ref 0.0–0.2)

## 2019-03-02 LAB — MAGNESIUM
Magnesium: 2.7 mg/dL — ABNORMAL HIGH (ref 1.7–2.4)
Magnesium: 2.5 mg/dL — ABNORMAL HIGH (ref 1.7–2.4)

## 2019-03-02 MED ORDER — METOLAZONE 5 MG PO TABS
5.0000 mg | ORAL_TABLET | Freq: Once | ORAL | Status: AC
  Administered 2019-03-02: 17:00:00 5 mg via ORAL
  Filled 2019-03-02: qty 1

## 2019-03-02 MED ORDER — POTASSIUM CHLORIDE CRYS ER 20 MEQ PO TBCR
40.0000 meq | EXTENDED_RELEASE_TABLET | Freq: Once | ORAL | Status: AC
  Administered 2019-03-02: 17:00:00 40 meq via ORAL
  Filled 2019-03-02: qty 2

## 2019-03-02 MED ORDER — CHLORHEXIDINE GLUCONATE CLOTH 2 % EX PADS
6.0000 | MEDICATED_PAD | Freq: Every day | CUTANEOUS | Status: DC
  Administered 2019-03-02 – 2019-03-06 (×4): 6 via TOPICAL

## 2019-03-02 MED ORDER — ENOXAPARIN SODIUM 40 MG/0.4ML ~~LOC~~ SOLN
40.0000 mg | Freq: Every day | SUBCUTANEOUS | Status: DC
  Administered 2019-03-02 – 2019-03-05 (×4): 40 mg via SUBCUTANEOUS
  Filled 2019-03-02 (×4): qty 0.4

## 2019-03-02 MED ORDER — INSULIN ASPART 100 UNIT/ML ~~LOC~~ SOLN: 0.0000 [IU] | SUBCUTANEOUS | Status: DC

## 2019-03-02 MED ORDER — FUROSEMIDE 10 MG/ML IJ SOLN
40.0000 mg | Freq: Once | INTRAMUSCULAR | Status: DC
  Filled 2019-03-02: qty 4

## 2019-03-02 MED ORDER — INSULIN ASPART 100 UNIT/ML ~~LOC~~ SOLN: 0.0000 [IU] | Freq: Three times a day (TID) | SUBCUTANEOUS | Status: DC

## 2019-03-02 MED ORDER — INSULIN ASPART 100 UNIT/ML ~~LOC~~ SOLN
0.0000 [IU] | SUBCUTANEOUS | Status: DC
  Administered 2019-03-02 – 2019-03-03 (×2): 2 [IU] via SUBCUTANEOUS
  Administered 2019-03-03: 4 [IU] via SUBCUTANEOUS

## 2019-03-02 MED ORDER — ORAL CARE MOUTH RINSE
15.0000 mL | Freq: Two times a day (BID) | OROMUCOSAL | Status: DC
  Administered 2019-03-02 – 2019-03-06 (×7): 15 mL via OROMUCOSAL

## 2019-03-02 MED ORDER — MUPIROCIN 2 % EX OINT
1.0000 "application " | TOPICAL_OINTMENT | Freq: Two times a day (BID) | CUTANEOUS | Status: DC
  Administered 2019-03-02 – 2019-03-06 (×11): 1 via NASAL
  Filled 2019-03-02 (×4): qty 22

## 2019-03-02 MED FILL — Thrombin (Recombinant) For Soln 20000 Unit: CUTANEOUS | Qty: 1 | Status: AC

## 2019-03-02 NOTE — Progress Notes (Signed)
Patient ID: Benjamin [name], male   DOB: [birth year], 77 y.o.   MRN: [id number] EVENING ROUNDS NOTE :     [address]       [place] [address]             [phone]                 1 Day Post-Op Procedure(s) (LRB): CORONARY ARTERY BYPASS GRAFTING (CABG) x 4, ON PUMP, USING LEFT INTERNAL MAMMARY ARTERY AND RIGHT GREATER SAPHENOUS VEIN HARVESTED ENDOSCOPICALLY (N/A) TRANSESOPHAGEAL ECHOCARDIOGRAM (TEE) (N/A)  Total Length of Stay:  LOS: 1 day  BP 108/71 (BP Location: Right Arm)   Pulse 82   Temp 97.8 F (36.6 C) (Oral)   Resp (!) 26   Ht 5' 11.5" (1.816 m)   Wt 90.6 kg   SpO2 94%   BMI 27.46 kg/m   .Intake/Output      06/15 0701 - 06/16 0700 06/16 0701 - 06/17 0700   I.V. (mL/kg) 3062.3 (33.8) 228.2 (2.5)   Blood 404    IV Piggyback 2089.2    Total Intake(mL/kg) 5555.5 (61.3) 228.2 (2.5)   Urine (mL/kg/hr) 3595 (1.7) 245 (0.3)   Blood 800    Chest Tube 640 50   Total Output 5035 295   Net +520.5 -66.8          . sodium chloride    . cefUROXime (ZINACEF)  IV 1.5 g (03/02/19 1636)  . lactated ringers    . lactated ringers 20 mL/hr at 03/02/19 1500  . nitroGLYCERIN Stopped (03/01/19 1853)  . phenylephrine (NEO-SYNEPHRINE) Adult infusion Stopped (03/02/19 0532)     Lab Results  Component Value Date   WBC 11.1 (H) 03/02/2019   HGB 11.5 (L) 03/02/2019   HCT 34.4 (L) 03/02/2019   PLT 148 (L) 03/02/2019   GLUCOSE 129 (H) 03/02/2019   CHOL 140 02/03/2019   TRIG 76 02/03/2019   HDL 40 02/03/2019   LDLCALC 83 02/03/2019   ALT 81 (H) 02/25/2019   AST 32 02/25/2019   NA 132 (L) 03/02/2019   K 4.1 03/02/2019   CL 102 03/02/2019   CREATININE 0.82 03/02/2019   BUN 11 03/02/2019   CO2 23 03/02/2019   TSH 3.04 10/02/2018   PSA 4.3 (H) 09/22/2018   INR 1.5 (H) 03/01/2019   HGBA1C 6.3 (H) 02/25/2019    Stable postop day 0ne Says allergic to lasix- "pees red"    Grace Isaac MD  Beeper [phone] Office [phone] 03/02/2019 5:01 PM

## 2019-03-02 NOTE — Discharge Instructions (Signed)

## 2019-03-02 NOTE — Progress Notes (Signed)
Pt requesting sleep aid. MD paged, no new orders received at this time.  Sherlie Ban, RN

## 2019-03-02 NOTE — Progress Notes (Signed)
1 Day Post-Op Procedure(s) (LRB): CORONARY ARTERY BYPASS GRAFTING (CABG) x 4, ON PUMP, USING LEFT INTERNAL MAMMARY ARTERY AND RIGHT GREATER SAPHENOUS VEIN HARVESTED ENDOSCOPICALLY (N/A) TRANSESOPHAGEAL ECHOCARDIOGRAM (TEE) (N/A) Subjective: No specific complaints  Objective: Vital signs in last 24 hours: Temp:  [96.3 F (35.7 C)-99.5 F (37.5 C)] 99.5 F (37.5 C) (06/16 0645) Pulse Rate:  [74-92] 90 (06/16 0645) Cardiac Rhythm: Atrial paced (06/16 0000) Resp:  [12-30] 30 (06/16 0645) BP: (90-140)/(63-100) 109/73 (06/16 0600) SpO2:  [90 %-99 %] 90 % (06/16 0645) Arterial Line BP: (86-150)/(43-86) 114/51 (06/16 0645) FiO2 (%):  [40 %-50 %] 40 % (06/15 1700) Weight:  [90.6 kg] 90.6 kg (06/16 0630)  Hemodynamic parameters for last 24 hours: PAP: (24-45)/(10-29) 32/20 CO:  [2.2 L/min-4.3 L/min] 4.3 L/min CI:  [1.5 L/min/m2-4.5 L/min/m2] 2.1 L/min/m2  Intake/Output from previous day: 06/15 0701 - 06/16 0700 In: 5555.5 [I.V.:3062.3; Blood:404; IV Piggyback:2089.2] Out: 6811 [XBWIO:0355; Blood:800; Chest Tube:640] Intake/Output this shift: Total I/O In: 836.4 [I.V.:478.5; IV Piggyback:357.9] Out: 1265 [Urine:925; Chest Tube:340]  General appearance: alert and cooperative Neurologic: intact Heart: regular rate and rhythm, S1, S2 normal, no murmur, click, rub or gallop Lungs: clear to auscultation bilaterally Extremities: edema mild Wound: dressings dry  Lab Results: Recent Labs    03/01/19 1945 03/02/19 0359 03/02/19 0403  WBC 9.2  --  11.1*  HGB 11.1* 10.9* 11.7*  HCT 33.3* 32.0* 34.4*  PLT 141*  --  167   BMET:  Recent Labs    03/01/19 1945 03/02/19 0359 03/02/19 0403  NA 136 135 134*  K 4.6 4.5 4.5  CL 107  --  105  CO2 22  --  22  GLUCOSE 151*  --  120*  BUN 12  --  11  CREATININE 0.79  --  0.82  CALCIUM 7.4*  --  7.8*    PT/INR:  Recent Labs    03/01/19 1325  LABPROT 17.6*  INR 1.5*   ABG    Component Value Date/Time   PHART 7.415 03/02/2019  0359   HCO3 23.1 03/02/2019 0359   TCO2 24 03/02/2019 0359   ACIDBASEDEF 1.0 03/02/2019 0359   O2SAT 94.0 03/02/2019 0359   CBG (last 3)  Recent Labs    03/01/19 1539 03/01/19 1939 03/02/19 0024  GLUCAP 119* 139* 125*   CXR: left base atelectasis.   Assessment/Plan: S/P Procedure(s) (LRB): CORONARY ARTERY BYPASS GRAFTING (CABG) x 4, ON PUMP, USING LEFT INTERNAL MAMMARY ARTERY AND RIGHT GREATER SAPHENOUS VEIN HARVESTED ENDOSCOPICALLY (N/A) TRANSESOPHAGEAL ECHOCARDIOGRAM (TEE) (N/A)  POD 1 Hemodynamically stable in sinus rhythm. Continue low dose Lopressor. ECG pending this am.  Mild volume excess: diuresis  DM: preop Hgb A1c was 6.3 on no meds. Continue SSI.  DC chest tubes, swan, arterial line.  IS, mobilize.   LOS: 1 day    [name] 03/02/2019

## 2019-03-02 NOTE — Progress Notes (Signed)
Bartle MD notified that patient will not take lasix and states that he is allergic to it but does not remember what reaction he has when he takes it.

## 2019-03-02 NOTE — Discharge Summary (Signed)
Physician Discharge Summary       [address]       [place] [address]             [phone]    Patient ID: Benjamin [name] MRN: [id number] DOB/AGE: Jul 15, 1941 78 y.o.  Admit date: 03/01/2019 Discharge date: 03/06/2019  Admission Diagnoses: Coronary artery disease  Discharge Diagnoses:  1. S/p CABG x 4 2. ABL anemia 3. History of COPD (chronic obstructive pulmonary disease) ([name]) 4. History of tobacco abuse 5. History of enlarged prostate 6. History of GERD (gastroesophageal reflux disease) 7. History of vision problems 8. History of arthritis   Procedure (s):  1. Median Sternotomy 2. Extracorporeal circulation 3.   Coronary artery bypass grafting x 4   Left internal mammary artery graft to the LAD  SVG to diagonal  SVG to OM  SVG to RCA 4.   Endoscopic vein harvest from the right leg by Dr. Cyndia Bent on 03/01/2019.  History of Presenting Illness: The patient is a 78 year old gentleman with a history of smoking until a few years ago, mild COPD by prior PFTs in 2016, and recently diagnosed dyslipidemia intolerant to statins who reports approximately a 55-month history of progressive exertional shortness of breath and fatigue. An echocardiogram on 10/23/2018 showed normal left ventricular systolic function with ejection fraction 55 to 60% with impaired diastolic relaxation. There is no significant valvular dysfunction. He has never had any chest pain or pressure. He was seen by Dr. Bronson Ing in March for cardiology evaluation. He underwent a gated cardiac CTA on 01/28/2019 which showed a calcium score of 1374 putting him at the 82nd percentile. There felt to be severe ostial, proximal, and mid LAD stenoses with calcified plaque with possible involvement of the distal left main with moderate stenosis of 50 to 69%. FFR was performed and showed no significant left main stenosis with a FFR of 0.99. There was severe stenosis at the proximal to mid LAD with a proximal FFR  of less than 0.50. The mid FFR was less than 0.50, and the distal FFR was less than 0.50. The left circumflex had no significant stenosis with a proximal FFR of 0.97. The RCA had no significant stenosis with a proximal FFR of 0.97 and distal FFR of 0.90. He underwent cardiac catheterization on 02/19/2019. This showed dense left main and proximal LAD calcification with about 50 to 60% distal left main stenosis. The proximal LAD had 95% stenosis involving a large diagonal. There is about 50% ostial left circumflex stenosis. The right coronary artery had mild mid vessel irregularity but no significant stenosis.  He has had worsening exertional dyspnea over the past 6 months that is now occurring with even walking across the room. He does report some shortness of breath at rest although he was able to talk with me today in the office without any difficulty and did not appear short of breath. He has 6 steps to get up into his house and said that he has to take that slowly and usually has to stop. He has had no pain in his neck, jaw, or arms. He denies any orthopnea or PND. He denies any peripheral edema.  He said that he developed a diffuse rash with statins and required a course of prednisone to resolve that. He also developed a severe diffuse rash after cardiac catheterization and was placed on another steroid taper for that. He is currently taking 20 mg daily and has 2 more days to finish. He has been taking  antihistamines for itching.   Dr. Cyndia Bent thought coronary bypass graft surgery is the best treatment for his coronary stenoses. Dr. Cyndia Bent reviewed the cardiac catheterization images with the patient and his wife and answered their questions. Dr. Cyndia Bent discussed the operative procedure with the patient and his wife including alternatives, benefits and risks. Patient agreed to proceed with surgery. Pre operative carotid duplex US showed no significant internal carotid artery stenosis. He underwent a CABG x 4 on  06/15.  Brief Hospital Course:  The patient was extubated the evening of surgery without difficulty. He remained afebrile and hemodynamically stable. [name], a line, chest tubes, and foley were removed early in the post operative course. Lopressor was started and titrated accordingly. He was volume over loaded and diuresed. He had ABL anemia. He did not require a post op transfusion. Last H and H was 10/30. He was weaned off the insulin drip.The patient's HGA1C pre op was  6.3.  He likely has pre diabetes and will need close medical follow up after discharge. The patient was felt surgically stable for transfer from the ICU to PCTU for further convalescence on 06/17. He continues to progress with cardiac rehab. He was ambulating on room air. He has been tolerating a diet and has had a bowel movement. Epicardial pacing wires were removed on 06/18. Chest tube sutures will be removed in the office after discharge. The patient is felt surgically stable for discharge today.   Latest Vital Signs: Blood pressure (!) 104/50, pulse 93, temperature (!) 97.4 F (36.3 C), temperature source Oral, resp. rate (!) 26, height 5' 11.5" (1.816 m), weight 84.1 kg, SpO2 94 %.  Physical Exam: General appearance: alert, cooperative and no distress Heart: regular rate and rhythm Lungs: min dim in bases Abdomen: benign Extremities: no edema Wound: incis healing well Discharge Condition: Stable and discharged to home.  Recent laboratory studies:  Lab Results  Component Value Date   WBC 11.0 (H) 03/04/2019   HGB 10.6 (L) 03/04/2019   HCT 30.4 (L) 03/04/2019   MCV 91.8 03/04/2019   PLT 156 03/04/2019   Lab Results  Component Value Date   NA 136 03/06/2019   K 4.4 03/06/2019   CL 104 03/06/2019   CO2 23 03/06/2019   CREATININE 0.99 03/06/2019   GLUCOSE 103 (H) 03/06/2019     Diagnostic Studies: Dg Chest 2 View  Result Date: 03/05/2019 CLINICAL DATA:  Post cardiac surgery EXAM: CHEST - 2 VIEW  COMPARISON:  03/04/2019 FINDINGS: Borderline enlargement of cardiac silhouette post CABG. Mediastinal contours and pulmonary vascularity normal. Small bibasilar pleural effusions and atelectasis slightly greater on LEFT. Remaining lungs clear. No infiltrate or pneumothorax. Bones unremarkable. IMPRESSION: Small bibasilar pleural effusions and atelectasis greater on LEFT. Electronically Signed   By: Lavonia Dana M.D.   On: 03/05/2019 10:10   Dg Chest 2 View  Result Date: 02/25/2019 CLINICAL DATA:  Preop evaluation for upcoming cardiac bypass EXAM: CHEST - 2 VIEW COMPARISON:  10/09/2018 FINDINGS: Cardiac shadow is stable. The lungs are well aerated bilaterally. No focal infiltrate or sizable effusion is seen. No bony abnormality is noted. IMPRESSION: No acute abnormality noted. Electronically Signed   By: Inez Catalina M.D.   On: 02/25/2019 11:23   Dg Chest Port 1 View  Result Date: 03/04/2019 CLINICAL DATA:  Pleural effusion EXAM: PORTABLE CHEST 1 VIEW COMPARISON:  Portable exam 0523 hours compared to 03/03/2019 FINDINGS: Interval removal of RIGHT jugular line. Enlargement of cardiac silhouette post CABG. Atherosclerotic calcification aorta. Mediastinal  contours and pulmonary vascularity otherwise normal. Bullous disease changes at apices. Persistent small bibasilar pleural effusions and basilar atelectasis. No definite infiltrate or pneumothorax. Bones demineralized. IMPRESSION: Minimal enlargement of cardiac silhouette post CABG. Persistent bibasilar effusions and atelectasis. Electronically Signed   By: Lavonia Dana M.D.   On: 03/04/2019 08:41   Dg Chest Port 1 View  Result Date: 03/03/2019 CLINICAL DATA:  Chest soreness post CABG. EXAM: PORTABLE CHEST 1 VIEW COMPARISON:  03/02/2019 FINDINGS: Interval removal of multiple tubes and lines. Right IJ central venous sheath remains in place with tip over the SVC. Lungs are hypoinflated demonstrate stable minimal left base opacification likely small effusion  with associated/atelectasis. Slight improvement minimal right base atelectasis. Mild stable cardiomegaly. Remainder of the exam is unchanged. IMPRESSION: Stable left base opacification likely small effusion with associated basilar atelectasis. Slight improvement mild right base atelectasis. Interval removal of multiple tubes and lines with right IJ venous sheath remaining in place with tip over the SVC. Electronically Signed   By: Marin Olp M.D.   On: 03/03/2019 08:04   Dg Chest Port 1 View  Result Date: 03/02/2019 CLINICAL DATA:  Chest tube.  CABG. EXAM: PORTABLE CHEST 1 VIEW COMPARISON:  03/01/2019. FINDINGS: Interim extubation and removal of NG tube. Left chest tube and, Swan-Ganz catheter, mediastinal drainage catheters in stable position. Bibasilar infiltrates/edema and small bilateral pleural effusions. CHF could present this fashion. Low lung volumes with bibasilar atelectasis. Thorax. IMPRESSION: 1. Interim extubation removal of NG tube. Remaining lines and tubes including left chest tube in stable position. No pneumothorax. 2. Prior CABG. Cardiomegaly with bibasilar infiltrates/edema bilateral pleural effusions. Findings suggest CHF. 3.  Bibasilar atelectasis. Electronically Signed   By: Marcello Moores  Register   On: 03/02/2019 08:14   Dg Chest Port 1 View  Result Date: 03/01/2019 CLINICAL DATA:  Status post CABG.  Dyspnea EXAM: PORTABLE CHEST 1 VIEW COMPARISON:  02/25/2019 FINDINGS: Left-sided chest tube in satisfactory position. Endotracheal tube with the tip 4.6 cm above the carina. Swan-Ganz catheter with the tip projecting over the right ventricular outflow tract. Mediastinal drain in place. Small left pleural effusion and left basilar atelectasis. Mild bilateral interstitial prominence. No pneumothorax. Stable cardiomegaly. Interval CABG. IMPRESSION: 1. Interval CABG. 2. Support lines and tubing in satisfactory position as detailed above. Electronically Signed   By: Kathreen Devoid   On: 03/01/2019  13:56   Vas US Doppler Pre Cabg  Result Date: 02/25/2019 PREOPERATIVE VASCULAR EVALUATION  Indications: Pre cabg. Performing Technologist: Abram Sander RVS  Examination Guidelines: A complete evaluation includes B-mode imaging, spectral Doppler, color Doppler, and power Doppler as needed of all accessible portions of each vessel. Bilateral testing is considered an integral part of a complete examination. Limited examinations for reoccurring indications may be performed as noted.  Right Carotid Findings: +----------+--------+--------+--------+-----------+--------+             PSV cm/s EDV cm/s Stenosis Describe    Comments  +----------+--------+--------+--------+-----------+--------+  CCA Prox   63       10                homogeneous           +----------+--------+--------+--------+-----------+--------+  CCA Distal 59       12                homogeneous           +----------+--------+--------+--------+-----------+--------+  ICA Prox   51       9  1-39%    homogeneous           +----------+--------+--------+--------+-----------+--------+  ICA Distal 37       12                                      +----------+--------+--------+--------+-----------+--------+  ECA        63       9                                       +----------+--------+--------+--------+-----------+--------+ Portions of this table do not appear on this page. +----------+--------+-------+--------+------------+             PSV cm/s EDV cms Describe Arm Pressure  +----------+--------+-------+--------+------------+  Subclavian 45                        134           +----------+--------+-------+--------+------------+ +---------+--------+--+--------+--+---------+  Vertebral PSV cm/s 38 EDV cm/s 10 Antegrade  +---------+--------+--+--------+--+---------+ Left Carotid Findings: +----------+--------+--------+--------+-----------+--------+             PSV cm/s EDV cm/s Stenosis Describe    Comments   +----------+--------+--------+--------+-----------+--------+  CCA Prox   64       14                homogeneous           +----------+--------+--------+--------+-----------+--------+  CCA Distal 51       17                homogeneous           +----------+--------+--------+--------+-----------+--------+  ICA Prox   40       17       1-39%    homogeneous           +----------+--------+--------+--------+-----------+--------+  ICA Distal 61       25                                      +----------+--------+--------+--------+-----------+--------+  ECA        69       13                                      +----------+--------+--------+--------+-----------+--------+ +----------+--------+--------+--------+------------+  Subclavian PSV cm/s EDV cm/s Describe Arm Pressure  +----------+--------+--------+--------+------------+             34                         141           +----------+--------+--------+--------+------------+ +---------+--------+--+--------+--+---------+  Vertebral PSV cm/s 29 EDV cm/s 11 Antegrade  +---------+--------+--+--------+--+---------+  ABI Findings: +--------+------------------+-----+---------+--------+  Right    Rt Pressure (mmHg) Index Waveform  Comment   +--------+------------------+-----+---------+--------+  Brachial 134                      triphasic           +--------+------------------+-----+---------+--------+  PTA  triphasic           +--------+------------------+-----+---------+--------+  DP                                triphasic           +--------+------------------+-----+---------+--------+ +--------+------------------+-----+---------+-------+  Left     Lt Pressure (mmHg) Index Waveform  Comment  +--------+------------------+-----+---------+-------+  Brachial 141                      triphasic          +--------+------------------+-----+---------+-------+  PTA                               triphasic           +--------+------------------+-----+---------+-------+  DP                                triphasic          +--------+------------------+-----+---------+-------+  Right Doppler Findings: +--------+--------+-----+---------+--------+  Site     Pressure Index Doppler   Comments  +--------+--------+-----+---------+--------+  Brachial 134            triphasic           +--------+--------+-----+---------+--------+  Radial                  triphasic           +--------+--------+-----+---------+--------+  Ulnar                   triphasic           +--------+--------+-----+---------+--------+  Left Doppler Findings: +--------+--------+-----+---------+--------+  Site     Pressure Index Doppler   Comments  +--------+--------+-----+---------+--------+  Brachial 141            triphasic           +--------+--------+-----+---------+--------+  Radial                  triphasic           +--------+--------+-----+---------+--------+  Ulnar                   triphasic           +--------+--------+-----+---------+--------+  Summary: Right Carotid: Velocities in the right ICA are consistent with a 1-39% stenosis. Left Carotid: Velocities in the left ICA are consistent with a 1-39% stenosis. Vertebrals: Bilateral vertebral arteries demonstrate antegrade flow. Right Upper Extremity: Doppler waveforms remain within normal limits with right radial compression. Doppler waveforms remain within normal limits with right ulnar compression. Left Upper Extremity: Doppler waveforms remain within normal limits with left radial compression. Doppler waveforms remain within normal limits with left ulnar compression.  Electronically signed by Servando Snare MD on 02/25/2019 at 5:32:27 PM.    Final        Discharge Instructions    Discharge patient   Complete by: As directed    Discharge disposition: 01-Home or Self Care   Discharge patient date: 03/06/2019      Discharge Medications: Allergies as of 03/06/2019      Reactions   Arsenic  Swelling   Severe swelling if patient comes in contact    Contrast Media [iodinated Diagnostic Agents] Swelling, Rash   SWELLING REACTION UNSPECIFIED    Statins Rash   Joint pain  Medication List    STOP taking these medications   diphenhydrAMINE 25 MG tablet Commonly known as: BENADRYL   hydrOXYzine 10 MG tablet Commonly known as: ATARAX/VISTARIL   loperamide 2 MG capsule Commonly known as: IMODIUM   mupirocin nasal ointment 2 % Commonly known as: BACTROBAN   OVER THE COUNTER MEDICATION   predniSONE 20 MG tablet Commonly known as: DELTASONE   promethazine 12.5 MG tablet Commonly known as: PHENERGAN     TAKE these medications   acetaminophen 500 MG tablet Commonly known as: TYLENOL Take 1,000 mg by mouth every 6 (six) hours as needed for moderate pain or headache.   albuterol 108 (90 Base) MCG/ACT inhaler Commonly known as: VENTOLIN HFA Inhale 2 puffs into the lungs every 6 (six) hours as needed for wheezing or shortness of breath.   albuterol (2.5 MG/3ML) 0.083% nebulizer solution Commonly known as: PROVENTIL Take 3 mLs (2.5 mg total) by nebulization every 6 (six) hours as needed for wheezing or shortness of breath.   aspirin 325 MG EC tablet Take 1 tablet (325 mg total) by mouth daily. What changed:   medication strength  how much to take   colestipol 1 g tablet Commonly known as: COLESTID Take 1 tablet (1 g total) by mouth 2 (two) times daily. What changed: how much to take   fluticasone furoate-vilanterol 100-25 MCG/INH Aepb Commonly known as: Breo Ellipta Inhale 1 puff into the lungs daily. For breathing   HYDROmorphone 2 MG tablet Commonly known as: Dilaudid Take 1 tablet (2 mg total) by mouth every 6 (six) hours as needed for up to 7 days for severe pain.   metoprolol tartrate 25 MG tablet Commonly known as: LOPRESSOR Take 0.5 tablets (12.5 mg total) by mouth 2 (two) times daily.   RABEprazole 20 MG tablet Commonly known as:  ACIPHEX Take 20 mg by mouth daily before breakfast.   tamsulosin 0.4 MG Caps capsule Commonly known as: FLOMAX Take 2 capsules (0.8 mg total) by mouth daily after supper. For prostate What changed: when to take this      The patient has been discharged on:   1.Beta Blocker:  Yes [ x  ]                              No   [   ]                              If No, reason:  2.Ace Inhibitor/ARB: Yes [   ]                                     No  [  n  ]                                     If No, reason:low BP  3.Statin:   Yes [   ]                  No  [ x  ]                  If No, reason:Allergy. Cardiology to decide if needs PCSK 9  4.Shela CommonsVelta Addison  [ x  ]  No   [   ]                  If No, reason:  Follow Up Appointments: Follow-up Information    [name], MD. Go on 04/07/2019.   Specialty: Cardiothoracic Surgery Why: PA/LAT CXR to be taken (at [facility] which is in the same building as Dr. Vivi Martens office) on 07/22 at 1:30 pm;Appointment time is at 2:00 pm Contact information: [address] [place] [address] [place], [place], [place]. Go on 03/18/2019.   Specialties: Physician Assistant, Cardiology Why: Appointment time is at 1:30 pm Contact information: [place] [address] [phone]        Alycia Rossetti, MD. Call.   Specialty: Family Medicine Why: for follow up appointment regarding further surveillance of HGA1C 6.3 (pre diabetes) Contact information: [address] [place] [address] [phone]        Herminio Commons, MD .   Specialty: Cardiology Contact information: [place] [address] [phone]           Signed: Gaspar Bidding 03/06/2019, 8:56 AM

## 2019-03-03 ENCOUNTER — Inpatient Hospital Stay (HOSPITAL_COMMUNITY): Payer: Medicare Other

## 2019-03-03 LAB — CBC
HCT: 33.9 % — ABNORMAL LOW (ref 39.0–52.0)
Hemoglobin: 11.4 g/dL — ABNORMAL LOW (ref 13.0–17.0)
MCH: 31.8 pg (ref 26.0–34.0)
MCHC: 33.6 g/dL (ref 30.0–36.0)
MCV: 94.4 fL (ref 80.0–100.0)
Platelets: 153 10*3/uL (ref 150–400)
RBC: 3.59 MIL/uL — ABNORMAL LOW (ref 4.22–5.81)
RDW: 13.4 % (ref 11.5–15.5)
WBC: 10.5 10*3/uL (ref 4.0–10.5)
nRBC: 0 % (ref 0.0–0.2)

## 2019-03-03 LAB — BASIC METABOLIC PANEL
Anion gap: 7 (ref 5–15)
BUN: 9 mg/dL (ref 8–23)
CO2: 24 mmol/L (ref 22–32)
Calcium: 8.3 mg/dL — ABNORMAL LOW (ref 8.9–10.3)
Chloride: 101 mmol/L (ref 98–111)
Creatinine, Ser: 0.74 mg/dL (ref 0.61–1.24)
GFR calc Af Amer: >60 mL/min (ref >60)
GFR calc non Af Amer: >60 mL/min (ref >60)
Glucose, Bld: 144 mg/dL — ABNORMAL HIGH (ref 70–99)
Potassium: 4.5 mmol/L (ref 3.5–5.1)
Sodium: 132 mmol/L — ABNORMAL LOW (ref 135–145)

## 2019-03-03 LAB — GLUCOSE, CAPILLARY
Glucose-Capillary: 138 mg/dL — ABNORMAL HIGH (ref 70–99)
Glucose-Capillary: 102 mg/dL — ABNORMAL HIGH (ref 70–99)
Glucose-Capillary: 138 mg/dL — ABNORMAL HIGH (ref 70–99)

## 2019-03-03 MED ORDER — FUROSEMIDE 40 MG PO TABS: 40.0000 mg | ORAL_TABLET | Freq: Every day | ORAL | Status: DC

## 2019-03-03 MED ORDER — METOLAZONE 5 MG PO TABS
5.0000 mg | ORAL_TABLET | Freq: Once | ORAL | Status: AC
  Administered 2019-03-03: 09:00:00 5 mg via ORAL
  Filled 2019-03-03: qty 1

## 2019-03-03 MED ORDER — KETOROLAC TROMETHAMINE 15 MG/ML IJ SOLN
15.0000 mg | Freq: Four times a day (QID) | INTRAMUSCULAR | Status: DC | PRN
  Administered 2019-03-03 – 2019-03-05 (×8): 15 mg via INTRAVENOUS
  Filled 2019-03-03 (×10): qty 1

## 2019-03-03 MED ORDER — INSULIN ASPART 100 UNIT/ML ~~LOC~~ SOLN: 0.0000 [IU] | Freq: Three times a day (TID) | SUBCUTANEOUS | Status: DC

## 2019-03-03 MED FILL — Lidocaine HCl Local Soln Prefilled Syringe 100 MG/5ML (2%): INTRAMUSCULAR | Qty: 5 | Status: AC

## 2019-03-03 MED FILL — Electrolyte-R (PH 7.4) Solution: INTRAVENOUS | Qty: 5000 | Status: AC

## 2019-03-03 MED FILL — Sodium Bicarbonate IV Soln 8.4%: INTRAVENOUS | Qty: 50 | Status: AC

## 2019-03-03 MED FILL — Mannitol IV Soln 20%: INTRAVENOUS | Qty: 500 | Status: AC

## 2019-03-03 MED FILL — Sodium Chloride IV Soln 0.9%: INTRAVENOUS | Qty: 3000 | Status: AC

## 2019-03-03 MED FILL — Heparin Sodium (Porcine) Inj 1000 Unit/ML: INTRAMUSCULAR | Qty: 10 | Status: AC

## 2019-03-03 NOTE — Progress Notes (Addendum)
      [address]       [place] [address]             [phone]        2 Days Post-Op Procedure(s) (LRB): CORONARY ARTERY BYPASS GRAFTING (CABG) x 4, ON PUMP, USING LEFT INTERNAL MAMMARY ARTERY AND RIGHT GREATER SAPHENOUS VEIN HARVESTED ENDOSCOPICALLY (N/A) TRANSESOPHAGEAL ECHOCARDIOGRAM (TEE) (N/A)  Subjective: Patient drinking Cranberry juice this am. He is having a fair amount of sternal, incisional pain. He states Oxy and Ultram do not help that much. He is trying not to take too much Morphine.  Objective: Vital signs in last 24 hours: Temp:  [97.8 F (36.6 C)-99.5 F (37.5 C)] 98.6 F (37 C) (06/17 0502) Pulse Rate:  [75-90] 75 (06/17 0700) Cardiac Rhythm: Normal sinus rhythm (06/17 0400) Resp:  [15-35] 19 (06/17 0700) BP: (89-115)/(59-73) 103/71 (06/17 0700) SpO2:  [91 %-96 %] 93 % (06/17 0700) Arterial Line BP: (89-112)/(42-52) 89/42 (06/16 0830) Weight:  [89.4 kg] 89.4 kg (06/17 0600)  Pre op weight 85 kg Current Weight  03/03/19 89.4 kg    Hemodynamic parameters for last 24 hours: PAP: (27-37)/(12-17) 27/14 CO:  [4.6 L/min] 4.6 L/min CI:  [2.3 L/min/m2] 2.3 L/min/m2  Intake/Output from previous day: 06/16 0701 - 06/17 0700 In: 591.5 [I.V.:488.1; IV Piggyback:103.4] Out: 1800 [Urine:1750; Chest Tube:50]   Physical Exam:  Cardiovascular: RRR Pulmonary: Diminished bibasilar breath sounds Abdomen: Soft, protuberant, non tender, bowel sounds present. Extremities: Mild bilateral lower extremity edema. Wounds: Sternal and RLE dressings are clean and dry.   Lab Results: CBC: Recent Labs    03/02/19 1508 03/03/19 0504  WBC 11.1* 10.5  HGB 11.5* 11.4*  HCT 34.4* 33.9*  PLT 148* 153   BMET:  Recent Labs    03/02/19 1508 03/03/19 0504  NA 132* 132*  K 4.1 4.5  CL 102 101  CO2 23 24  GLUCOSE 129* 144*  BUN 11 9  CREATININE 0.82 0.74  CALCIUM 8.1* 8.3*    PT/INR:  Lab Results  Component Value Date   INR 1.5 (H)  03/01/2019   INR 1.1 02/25/2019   INR 1.0 02/03/2019   ABG:  INR: Will add last result for INR, ABG once components are confirmed Will add last 4 CBG results once components are confirmed  Assessment/Plan:  1. CV - SR in the 70's this am. On Lopressor 12.5 mg bid. 2.  Pulmonary - History of COPD so continue BreoOn 5-6 liters via [place]. Wean as able. CXR this am appears to show bibasilar atelectasis and pleural effusions. Encourage incentive spirometer and flutter valve. 3. Volume Overload - Will give Zaroxolyn this am since patient has refused Lasix 4.  Acute blood loss anemia - H and H this am stable at 11.4 and 33.9 5. Hyponatremia-sodium remains 132 this am 6. CBGs 107/107/138. Pre op HGA1C 6.3. He likely has pre diabetes. Will stop accu checks and SS on transfer. 7. Remove sleeve and foley 8. Will discuss with Dr. Cyndia Bent if should try a few doses of Toradol to help with pain 9. Transfer to floor when bed available  Sharalyn Ink [facility] 03/03/2019,7:21 AM [phone]   Chart reviewed, patient examined, agree with above. He looks good overall.  Will try Toradol for pain since creat is normal. Responded well to Zaroxolyn so getting another dose today.

## 2019-03-03 NOTE — Progress Notes (Signed)
Pt refusing to ambulate this morning, states he needs to "build his stamina" and is in too much pain. Pt has refused all pain medications except morphine and tylenol. Educated pt on importance of ambulation, pt continues to adamantly refuse to ambulate. Pt is agreeable to get into chair this AM. Will address with oncoming nursing staff and MD.  Sherlie Ban, RN

## 2019-03-04 ENCOUNTER — Inpatient Hospital Stay (HOSPITAL_COMMUNITY): Payer: Medicare Other

## 2019-03-04 LAB — BASIC METABOLIC PANEL
Anion gap: 8 (ref 5–15)
BUN: 16 mg/dL (ref 8–23)
CO2: 23 mmol/L (ref 22–32)
Calcium: 8.2 mg/dL — ABNORMAL LOW (ref 8.9–10.3)
Chloride: 101 mmol/L (ref 98–111)
Creatinine, Ser: 1.04 mg/dL (ref 0.61–1.24)
GFR calc Af Amer: >60 mL/min (ref >60)
GFR calc non Af Amer: >60 mL/min (ref >60)
Glucose, Bld: 122 mg/dL — ABNORMAL HIGH (ref 70–99)
Potassium: 3.7 mmol/L (ref 3.5–5.1)
Sodium: 132 mmol/L — ABNORMAL LOW (ref 135–145)

## 2019-03-04 LAB — CBC
HCT: 30.4 % — ABNORMAL LOW (ref 39.0–52.0)
Hemoglobin: 10.6 g/dL — ABNORMAL LOW (ref 13.0–17.0)
MCH: 32 pg (ref 26.0–34.0)
MCHC: 34.9 g/dL (ref 30.0–36.0)
MCV: 91.8 fL (ref 80.0–100.0)
Platelets: 156 10*3/uL (ref 150–400)
RBC: 3.31 MIL/uL — ABNORMAL LOW (ref 4.22–5.81)
RDW: 13.2 % (ref 11.5–15.5)
WBC: 11 10*3/uL — ABNORMAL HIGH (ref 4.0–10.5)
nRBC: 0 % (ref 0.0–0.2)

## 2019-03-04 LAB — GLUCOSE, CAPILLARY: Glucose-Capillary: 168 mg/dL — ABNORMAL HIGH (ref 70–99)

## 2019-03-04 MED ORDER — POTASSIUM CHLORIDE CRYS ER 20 MEQ PO TBCR
20.0000 meq | EXTENDED_RELEASE_TABLET | Freq: Every day | ORAL | Status: DC
  Administered 2019-03-04: 09:00:00 20 meq via ORAL
  Filled 2019-03-04: qty 1

## 2019-03-04 MED ORDER — POTASSIUM CHLORIDE CRYS ER 20 MEQ PO TBCR
30.0000 meq | EXTENDED_RELEASE_TABLET | Freq: Once | ORAL | Status: AC
  Administered 2019-03-04: 30 meq via ORAL
  Filled 2019-03-04: qty 1

## 2019-03-04 MED ORDER — METOLAZONE 5 MG PO TABS
5.0000 mg | ORAL_TABLET | Freq: Every day | ORAL | Status: DC
  Administered 2019-03-04: 08:00:00 5 mg via ORAL
  Filled 2019-03-04: qty 1

## 2019-03-04 NOTE — Progress Notes (Signed)
3 Days Post-Op Procedure(s) (LRB): CORONARY ARTERY BYPASS GRAFTING (CABG) x 4, ON PUMP, USING LEFT INTERNAL MAMMARY ARTERY AND RIGHT GREATER SAPHENOUS VEIN HARVESTED ENDOSCOPICALLY (N/A) TRANSESOPHAGEAL ECHOCARDIOGRAM (TEE) (N/A) Subjective: Complains of incisional pain. Feels like Toradol has worked well for his pain.  Objective: Vital signs in last 24 hours: Temp:  [97.6 F (36.4 C)-98.6 F (37 C)] 98.6 F (37 C) (06/18 0515) Pulse Rate:  [76-87] 85 (06/18 0515) Cardiac Rhythm: Normal sinus rhythm (06/18 0700) Resp:  [16-25] 25 (06/18 0515) BP: (90-119)/(54-70) 96/66 (06/18 0515) SpO2:  [89 %-99 %] 93 % (06/18 0515) Weight:  [90.3 kg] 90.3 kg (06/18 0515)  Hemodynamic parameters for last 24 hours:    Intake/Output from previous day: 06/17 0701 - 06/18 0700 In: 13267.2 [P.O.:13250; I.V.:17.2] Out: 1175 [Urine:1175] Intake/Output this shift: No intake/output data recorded.  General appearance: alert and cooperative Neurologic: intact Heart: regular rate and rhythm, S1, S2 normal, no murmur, click, rub or gallop Lungs: clear to auscultation bilaterally Extremities: edema mild Wound: dressing dry  Lab Results: Recent Labs    03/03/19 0504 03/04/19 0330  WBC 10.5 11.0*  HGB 11.4* 10.6*  HCT 33.9* 30.4*  PLT 153 156   BMET:  Recent Labs    03/03/19 0504 03/04/19 0330  NA 132* 132*  K 4.5 3.7  CL 101 101  CO2 24 23  GLUCOSE 144* 122*  BUN 9 16  CREATININE 0.74 1.04  CALCIUM 8.3* 8.2*    PT/INR:  Recent Labs    03/01/19 1325  LABPROT 17.6*  INR 1.5*   ABG    Component Value Date/Time   PHART 7.415 03/02/2019 0359   HCO3 23.1 03/02/2019 0359   TCO2 24 03/02/2019 0359   ACIDBASEDEF 1.0 03/02/2019 0359   O2SAT 94.0 03/02/2019 0359   CBG (last 3)  Recent Labs    03/03/19 0743 03/03/19 1136 03/03/19 1524  GLUCAP 168* 102* 138*    Assessment/Plan: S/P Procedure(s) (LRB): CORONARY ARTERY BYPASS GRAFTING (CABG) x 4, ON PUMP, USING LEFT INTERNAL  MAMMARY ARTERY AND RIGHT GREATER SAPHENOUS VEIN HARVESTED ENDOSCOPICALLY (N/A) TRANSESOPHAGEAL ECHOCARDIOGRAM (TEE) (N/A)  POD 3 Hemodynamically stable in sinus rhythm. Continue Lopressor  Volume excess: wt is still 11 lbs over preop. I/O totally inaccurate yesterday. Will give him another dose of metolazone today. Replace K+.  Still on oxygen. His diffusion capacity on PFT's was 32% of normal so I would expect his room air sat to be around 90% or so.  DM: glucose under adequate control yesterday. His preop Hgb A1c was 6.3 on no meds so will need attention to diet modification and medical follow up.  Continue IS, ambulation   LOS: 3 days    [name] 03/04/2019

## 2019-03-04 NOTE — Progress Notes (Signed)
CARDIAC REHAB PHASE I   PRE:  Rate/Rhythm: 92 SR  BP:  Sitting: 110/65      SaO2: 85 RA --> 92 2L  MODE:  Ambulation: 50 ft   POST:  Rate/Rhythm: 100 ST  BP:  Sitting: 98/67    SaO2: 95 4L   Pt ambulated 72ft in hallway assist of one with gait belt and front wheel walker. Pt with slow shuffling gait. During ambulation pt c/o multiple pain sites and "lightheadedness". Pt encouraged to listen to his body and return to room. Upon return to room, BP noted to be decreased, RN aware. Pt coached through purse lipped breathing, encouraged IS use, reinforced sternal precautions, and stressed importance of listening to his body. Pt with call bell and phone within reach. Will continue to follow.  [id number] Rufina Falco, RN BSN 03/04/2019 9:03 AM

## 2019-03-04 NOTE — Progress Notes (Signed)
Walked in to to patients room asking to take a walk, pt  complained of increased work of breathing. VSS O2 96 @ 4L 1500 on IS. Pain medicine given, will continue to monitor and walk later.

## 2019-03-05 ENCOUNTER — Inpatient Hospital Stay (HOSPITAL_COMMUNITY): Payer: Medicare Other

## 2019-03-05 LAB — ECHO INTRAOPERATIVE TEE
Height: 71.5 in
Weight: 3008 oz

## 2019-03-05 LAB — BASIC METABOLIC PANEL
Anion gap: 9 (ref 5–15)
BUN: 16 mg/dL (ref 8–23)
CO2: 24 mmol/L (ref 22–32)
Calcium: 8.2 mg/dL — ABNORMAL LOW (ref 8.9–10.3)
Chloride: 103 mmol/L (ref 98–111)
Creatinine, Ser: 1.03 mg/dL (ref 0.61–1.24)
GFR calc Af Amer: >60 mL/min (ref >60)
GFR calc non Af Amer: >60 mL/min (ref >60)
Glucose, Bld: 102 mg/dL — ABNORMAL HIGH (ref 70–99)
Potassium: 4.4 mmol/L (ref 3.5–5.1)
Sodium: 136 mmol/L (ref 135–145)

## 2019-03-05 MED ORDER — METOLAZONE 5 MG PO TABS
5.0000 mg | ORAL_TABLET | Freq: Once | ORAL | Status: DC
  Filled 2019-03-05: qty 1

## 2019-03-05 MED ORDER — POTASSIUM CHLORIDE CRYS ER 20 MEQ PO TBCR
20.0000 meq | EXTENDED_RELEASE_TABLET | Freq: Two times a day (BID) | ORAL | Status: AC
  Administered 2019-03-05 (×2): 20 meq via ORAL
  Filled 2019-03-05 (×2): qty 1

## 2019-03-05 MED ORDER — FUROSEMIDE 40 MG PO TABS: 40.0000 mg | ORAL_TABLET | Freq: Every day | ORAL | Status: DC

## 2019-03-05 MED ORDER — METOLAZONE 5 MG PO TABS: 5.0000 mg | ORAL_TABLET | Freq: Every day | ORAL | Status: DC

## 2019-03-05 NOTE — Plan of Care (Signed)

## 2019-03-05 NOTE — Progress Notes (Addendum)
Pt walked approx 300 ft this AM w/ rollator walker. Tolerated well. Limited by leg cramping and sciatic nerve pain. Weaned pt to room air, pt states he is actually breathing quite well and his sats teeter between 90-95%. Pt transported to xray shortly after. This was his first walk of the day, encouraged 2 more.

## 2019-03-05 NOTE — Progress Notes (Signed)
CARDIAC REHAB PHASE I   PRE:  Rate/Rhythm: 96 SR  BP:  Sitting: 98/64      SaO2: 93 RA  MODE:  Ambulation: 220 ft   POST:  Rate/Rhythm: 105 ST  BP:  Sitting: 111/78    SaO2: 91 RA   Pt ambulated 254ft in hallway assist of one with gait belt and front wheel walker. Pt c/o sciatic pain and SOB. Encouraged pt to stop and rest, and coached through purse lipped. Pt hesitant to stop, and focuses on "bad day". Encouraged pt to walk at least 1-2 times more today, and continue IS use. Will continue to follow.  [id number] Rufina Falco, RN BSN 03/05/2019 9:39 AM

## 2019-03-05 NOTE — Progress Notes (Addendum)
      [address]       ,[place] [address]             [phone]      4 Days Post-Op Procedure(s) (LRB): CORONARY ARTERY BYPASS GRAFTING (CABG) x 4, ON PUMP, USING LEFT INTERNAL MAMMARY ARTERY AND RIGHT GREATER SAPHENOUS VEIN HARVESTED ENDOSCOPICALLY (N/A) TRANSESOPHAGEAL ECHOCARDIOGRAM (TEE) (N/A)   Subjective:  Patient states not doing well as he is in the hospital and sick.  He has incisional pain relieved with medication.  He feels short of breath.  + BM  Objective: Vital signs in last 24 hours: Temp:  [97.9 F (36.6 C)-98.9 F (37.2 C)] 98.7 F (37.1 C) (06/19 0520) Pulse Rate:  [80-109] 80 (06/19 0520) Cardiac Rhythm: Sinus tachycardia (06/19 0700) Resp:  [16-29] 20 (06/19 0520) BP: (73-118)/(48-68) 95/56 (06/19 0520) SpO2:  [89 %-97 %] 92 % (06/19 0520) Weight:  [87.1 kg] 87.1 kg (06/19 0700)  Intake/Output from previous day: 06/18 0701 - 06/19 0700 In: 360 [P.O.:360] Out: 750 [Urine:750]  General appearance: alert, cooperative and no distress Heart: regular rate and rhythm Lungs: clear to auscultation bilaterally Abdomen: soft, non-tender; bowel sounds normal; no masses,  no organomegaly Extremities: edema trace Wound: clean and dry  Lab Results: Recent Labs    03/03/19 0504 03/04/19 0330  WBC 10.5 11.0*  HGB 11.4* 10.6*  HCT 33.9* 30.4*  PLT 153 156   BMET:  Recent Labs    03/04/19 0330 03/05/19 0351  NA 132* 136  K 3.7 4.4  CL 101 103  CO2 23 24  GLUCOSE 122* 102*  BUN 16 16  CREATININE 1.04 1.03  CALCIUM 8.2* 8.2*    PT/INR: No results for input(s): LABPROT, INR in the last 72 hours. ABG    Component Value Date/Time   PHART 7.415 03/02/2019 0359   HCO3 23.1 03/02/2019 0359   TCO2 24 03/02/2019 0359   ACIDBASEDEF 1.0 03/02/2019 0359   O2SAT 94.0 03/02/2019 0359   CBG (last 3)  Recent Labs    03/03/19 0743 03/03/19 1136 03/03/19 1524  GLUCAP 168* 102* 138*    Assessment/Plan: S/P Procedure(s) (LRB):  CORONARY ARTERY BYPASS GRAFTING (CABG) x 4, ON PUMP, USING LEFT INTERNAL MAMMARY ARTERY AND RIGHT GREATER SAPHENOUS VEIN HARVESTED ENDOSCOPICALLY (N/A) TRANSESOPHAGEAL ECHOCARDIOGRAM (TEE) (N/A)  1. CV- NSR, BP in the 90s- continue Lopressor 12.5 mg BID 2. Pulm- off oxygen, trace left pleural effusion, continue IS 3. Renal- creatinine remains stable, mild edema on exam, weight is trending down, patient states he is allergic to Lasix, will give additional dose of Metolazone today, K is 4.4 continue supplementation 4. Deconditioning- mild, patient wants to do outpatient PT.. he declines home health 5. Dispo- patient stable, will diurese again today, get repeat BMET as he is using Toradol and diuretics, maintaining NSR, EPW wires are out, possibly d/c home in next 24-48 hours   LOS: 4 days    Benjamin [name] 03/05/2019   Chart reviewed, patient examined, agree with above. He has diuresed well with Metolazone. Will give one more dose today to get him to baseline wt.

## 2019-03-05 NOTE — Care Management Important Message (Signed)
Important Message  Patient Details  Name: Benjamin [name] MRN: [id number] Date of Birth: June 29, 1941   Medicare Important Message Given:  Yes     Shelda Altes 03/05/2019, 12:08 PM

## 2019-03-06 LAB — BASIC METABOLIC PANEL
Anion gap: 9 (ref 5–15)
BUN: 19 mg/dL (ref 8–23)
CO2: 23 mmol/L (ref 22–32)
Calcium: 8.8 mg/dL — ABNORMAL LOW (ref 8.9–10.3)
Chloride: 104 mmol/L (ref 98–111)
Creatinine, Ser: 0.99 mg/dL (ref 0.61–1.24)
GFR calc Af Amer: >60 mL/min (ref >60)
GFR calc non Af Amer: >60 mL/min (ref >60)
Glucose, Bld: 103 mg/dL — ABNORMAL HIGH (ref 70–99)
Potassium: 4.4 mmol/L (ref 3.5–5.1)
Sodium: 136 mmol/L (ref 135–145)

## 2019-03-06 MED ORDER — ASPIRIN 325 MG PO TBEC: 325.0000 mg | DELAYED_RELEASE_TABLET | Freq: Every day | ORAL | Status: DC

## 2019-03-06 MED ORDER — HYDROMORPHONE HCL 2 MG PO TABS: 2.0000 mg | ORAL_TABLET | Freq: Four times a day (QID) | ORAL | 0 refills | Status: AC | PRN

## 2019-03-06 MED ORDER — METOPROLOL TARTRATE 25 MG PO TABS: 12.5000 mg | ORAL_TABLET | Freq: Two times a day (BID) | ORAL | 1 refills | Status: DC

## 2019-03-06 NOTE — Progress Notes (Signed)
[address]       [place] [address]             [phone]      5 Days Post-Op Procedure(s) (LRB): CORONARY ARTERY BYPASS GRAFTING (CABG) x 4, ON PUMP, USING LEFT INTERNAL MAMMARY ARTERY AND RIGHT GREATER SAPHENOUS VEIN HARVESTED ENDOSCOPICALLY (N/A) TRANSESOPHAGEAL ECHOCARDIOGRAM (TEE) (N/A) Subjective:  feels well, no new issues   Objective: Vital signs in last 24 hours: Temp:  [97.8 F (36.6 C)-98.5 F (36.9 C)] 97.8 F (36.6 C) (06/20 0004) Pulse Rate:  [78-99] 87 (06/20 0255) Cardiac Rhythm: Normal sinus rhythm (06/20 0722) Resp:  [14-29] 25 (06/20 0255) BP: (87-109)/(62-78) 108/78 (06/20 0255) SpO2:  [91 %-98 %] 96 % (06/20 0814) Weight:  [84.1 kg] 84.1 kg (06/20 0648)  Hemodynamic parameters for last 24 hours:    Intake/Output from previous day: 06/19 0701 - 06/20 0700 In: 840 [P.O.:840] Out: 1971 [Urine:1970; Stool:1] Intake/Output this shift: No intake/output data recorded.  General appearance: alert, cooperative and no distress Heart: regular rate and rhythm Lungs: min dim in bases Abdomen: benign Extremities: no edema Wound: incis healing well  Lab Results: Recent Labs    03/04/19 0330  WBC 11.0*  HGB 10.6*  HCT 30.4*  PLT 156   BMET:  Recent Labs    03/05/19 0351 03/06/19 0320  NA 136 136  K 4.4 4.4  CL 103 104  CO2 24 23  GLUCOSE 102* 103*  BUN 16 19  CREATININE 1.03 0.99  CALCIUM 8.2* 8.8*    PT/INR: No results for input(s): LABPROT, INR in the last 72 hours. ABG    Component Value Date/Time   PHART 7.415 03/02/2019 0359   HCO3 23.1 03/02/2019 0359   TCO2 24 03/02/2019 0359   ACIDBASEDEF 1.0 03/02/2019 0359   O2SAT 94.0 03/02/2019 0359   CBG (last 3)  Recent Labs    03/03/19 1136 03/03/19 1524  GLUCAP 102* 138*    Meds Scheduled Meds: . acetaminophen  1,000 mg Oral Q6H   Or  . acetaminophen (TYLENOL) oral liquid 160 mg/5 mL  1,000 mg Per Tube Q6H  . aspirin EC  325 mg Oral Daily   Or   . aspirin  324 mg Per Tube Daily  . bisacodyl  10 mg Oral Daily   Or  . bisacodyl  10 mg Rectal Daily  . Chlorhexidine Gluconate Cloth  6 each Topical Daily  . Chlorhexidine Gluconate Cloth  6 each Topical Daily  . colestipol  1 g Oral BID  . docusate sodium  200 mg Oral Daily  . enoxaparin (LOVENOX) injection  40 mg Subcutaneous QHS  . fluticasone furoate-vilanterol  1 puff Inhalation Daily  . mouth rinse  15 mL Mouth Rinse BID  . metolazone  5 mg Oral Once  . metoprolol tartrate  12.5 mg Oral BID   Or  . metoprolol tartrate  12.5 mg Per Tube BID  . mupirocin ointment  1 application Nasal BID  . pantoprazole  40 mg Oral Daily  . sodium chloride flush  10-40 mL Intracatheter Q12H  . sodium chloride flush  3 mL Intravenous Q12H  . tamsulosin  0.8 mg Oral QPC supper   Continuous Infusions: . sodium chloride     PRN Meds:.ketorolac, metoprolol tartrate, ondansetron (ZOFRAN) IV, oxyCODONE, sodium chloride flush, sodium chloride flush, traMADol  Xrays Dg Chest 2 View  Result Date: 03/05/2019 CLINICAL DATA:  Post cardiac surgery EXAM: CHEST - 2 VIEW COMPARISON:  03/04/2019 FINDINGS: Borderline enlargement of cardiac silhouette post CABG. Mediastinal contours and pulmonary vascularity normal. Small bibasilar pleural effusions and atelectasis slightly greater on LEFT. Remaining lungs clear. No infiltrate or pneumothorax. Bones unremarkable. IMPRESSION: Small bibasilar pleural effusions and atelectasis greater on LEFT. Electronically Signed   By: Lavonia Dana M.D.   On: 03/05/2019 10:10    Assessment/Plan: S/P Procedure(s) (LRB): CORONARY ARTERY BYPASS GRAFTING (CABG) x 4, ON PUMP, USING LEFT INTERNAL MAMMARY ARTERY AND RIGHT GREATER SAPHENOUS VEIN HARVESTED ENDOSCOPICALLY (N/A) TRANSESOPHAGEAL ECHOCARDIOGRAM (TEE) (N/A)  1 conts to do well 2 hemodyn stable in sinus rhythm, no fevers 3 BS good control 5 renal fxn normal, no significant clinical volume overload 6 stable for discharge   LOS: 5 days    John [facility] 03/06/2019 Pager [phone]

## 2019-03-06 NOTE — Progress Notes (Signed)
Order received to discharge patient.  Telemetry monitor removed and CCMD notified.  PIV access removed.  Discharge instructions, follow up, medications and instructions for their use discussed with patient. 

## 2019-03-06 NOTE — Progress Notes (Addendum)
CARDIAC REHAB PHASE I   PRE:  Rate/Rhythm: 96 SR  BP:  Sitting: 95/58        SaO2: 92% RA  MODE:  Ambulation: 240 ft   POST:  Rate/Rhythm: 111 SR  BP:  Sitting: 113/76      SaO2: 94% RA  Pt ambulated with one assist, gait belt, and walker. Pt stated he had SOB and CP from incision. Encouraged Pt to stand tall while walking and keep chest up. O2 did not decrease with walk. Pt took 4 standing rest breaks during walk. Encouraged deep breathing. Pt educated on nutrition, exercise guidelines, incision care, IS use, restrictions, sternal precautions, and CRP II. Referral to CRP II [place]. Pt to bed with call bell and phone within reach.   [id number]  Jeralyn Bennett BS, ACSM CEP 03/06/2019  9:54 AM

## 2019-03-08 ENCOUNTER — Other Ambulatory Visit: Payer: Self-pay | Admitting: Family Medicine

## 2019-03-08 ENCOUNTER — Telehealth: Payer: Self-pay

## 2019-03-08 MED ORDER — TRAMADOL HCL 50 MG PO TABS: 50.0000 mg | ORAL_TABLET | Freq: Four times a day (QID) | ORAL | 0 refills | Status: DC | PRN

## 2019-03-08 NOTE — Telephone Encounter (Signed)
Refill on tramadol to [facility]

## 2019-03-08 NOTE — Telephone Encounter (Signed)
Ok to refill??  Last office visit 02/04/2019.  Last refill 10/10/2018.

## 2019-03-08 NOTE — Telephone Encounter (Signed)
Patient contacted the office with questions regarding his pain mediation.  He was discharged from the hospital Saturday after CABG with Dr. Cyndia Bent 03/01/2019.  Patient states he is taking Dilaudid 2mg  every 6 hours and it is not helping.  He stated that it is a "sharp" pain all along his incision site.  I advised him that he could take Ibuprofen, but he stated that he has an ulcer.  He also stated that he was taking Tramadol in the hospital and it was working.  I advised to contact his Gi physician to see if he would be able to take Ibuprofen occasionally with the ulcer to help his pain.  He acknowledged receipt.  I also advised that Dr. Cyndia Bent would not prescribe another pain narcotic but may prescribe Tramadol if patient's pain does not subside and his GI physician accepts him taking NSAIDS.

## 2019-03-17 ENCOUNTER — Ambulatory Visit (INDEPENDENT_AMBULATORY_CARE_PROVIDER_SITE_OTHER): Payer: Medicare Other | Admitting: Family Medicine

## 2019-03-17 ENCOUNTER — Telehealth: Payer: Self-pay | Admitting: Student

## 2019-03-17 ENCOUNTER — Encounter: Payer: Self-pay | Admitting: Family Medicine

## 2019-03-17 ENCOUNTER — Other Ambulatory Visit: Payer: Self-pay

## 2019-03-17 DIAGNOSIS — Z951 Presence of aortocoronary bypass graft: Secondary | ICD-10-CM | POA: Diagnosis not present

## 2019-03-17 DIAGNOSIS — L570 Actinic keratosis: Secondary | ICD-10-CM | POA: Diagnosis not present

## 2019-03-17 DIAGNOSIS — I2510 Atherosclerotic heart disease of native coronary artery without angina pectoris: Secondary | ICD-10-CM | POA: Diagnosis not present

## 2019-03-17 MED ORDER — HYDROMORPHONE HCL 2 MG PO TABS: 2.0000 mg | ORAL_TABLET | Freq: Four times a day (QID) | ORAL | 0 refills | Status: DC | PRN

## 2019-03-17 NOTE — Telephone Encounter (Signed)
Called to change patient's visit to [facility] per scheduling order.  Patient's wife was irrate demanding to know why he couldn't come in office and she just came in on yesterday (6/30). I tried to explain to her that different providers had different regulations. She was extremely rude and demanded for nurse to call her today. / tg

## 2019-03-17 NOTE — Progress Notes (Signed)
   Subjective:    Patient ID: Benjamin [name], male    DOB: 1941-08-23, 78 y.o.   MRN: [id number]  Patient presents for Hospital F/U (S/P CABG) and Pain (chest still hurting from CABG- using APAP and Tramadol- also having nerve pain in leg vein removal)  Patient here for hospital follow-up and initially to get cryotherapy for his actinic keratoses on his leg.  He had a biopsy done about a month ago however since then he has had a CABG.  He is in severe pain since his surgery.  His incisions look good there is no redness or draining from the leg abdomen or the chest.  But he has been in constant pain since the hospital.  He was on a stronger pain medicine in the hospital was sent home with Dilaudid but when this ran out he started on tramadol which is not been effective.  No change in his breathing no fever no chills.  No palpitations.  Just pain near all the incisions.  He has appointment tomorrow with the surgeon PA.  Questing to go back on his other pain medication was told by cardiology that they could not fill this.  Meds reviewed  Does not need nausea medication Had large BM yesterday    BMET unremarkable at discharge, Hb 10.6  Review Of Systems:  GEN- denies fatigue, fever, weight loss,weakness, recent illness HEENT- denies eye drainage, change in vision, nasal discharge, CVS- denies chest pain, palpitations RESP- denies SOB, cough, wheeze ABD- denies N/V, change in stools, abd pain GU- denies dysuria, hematuria, dribbling, incontinence MSK- denies joint pain, +muscle aches, injury Neuro- denies headache, dizziness, syncope, seizure activity       Objective:    BP 110/62   Pulse 82   Temp 97.9 F (36.6 C) (Oral)   Resp 16   SpO2 93%  GEN- NAD, alert and oriented x3,sitting in wheelchair  HEENT- PERRL, EOMI, non injected sclera, pink conjunctiva, MMM, oropharynx clear CVS- RRR, no murmur Chest wall- tender RESP-CTAB ABD-NABS,soft,NT,ND EXT- No edema , incision well healed   Pulses- Radial  2+         Assessment & Plan:      Problem List Items Addressed This Visit      Unprioritized   AK (actinic keratosis)    He can follow-up in 4 to 6 weeks for cryotherapy for the remainder of the actinic keratoses.  Most of this lesion is already gone because of the biopsy.  He is in significant pain and this is not necessary today.      S/P CABG (coronary artery bypass graft)    S/p CABG still with significant pain.  He has follow-up tomorrow.  I will go ahead and refill his Dilaudid which he was on when he left the hospital.  Discussed the effects of this medication on breathing bowels he is to use a stool softener daily.  Also discussed that this is a highly addictive medication both he and wife voiced understanding.         Note: This dictation was prepared with Dragon dictation along with smaller phrase technology. Any transcriptional errors that result from this process are unintentional.

## 2019-03-17 NOTE — Assessment & Plan Note (Signed)
S/p CABG still with significant pain.  He has follow-up tomorrow.  I will go ahead and refill his Dilaudid which he was on when he left the hospital.  Discussed the effects of this medication on breathing bowels he is to use a stool softener daily.  Also discussed that this is a highly addictive medication both he and wife voiced understanding.

## 2019-03-17 NOTE — Telephone Encounter (Signed)
I spoke with patient and wife.He saw pcp and got a refill of Dilaudid as he is s/p CABG. States he is feeling better.I encouraged him to use his incentive spirometer, he cancelled virtual apt with PA tomorrow.He has apt with Dr.Koneswarn on 03/29/19

## 2019-03-17 NOTE — Assessment & Plan Note (Signed)
He can follow-up in 4 to 6 weeks for cryotherapy for the remainder of the actinic keratoses.  Most of this lesion is already gone because of the biopsy.  He is in significant pain and this is not necessary today.

## 2019-03-18 ENCOUNTER — Telehealth: Payer: Medicare Other | Admitting: Student

## 2019-03-23 ENCOUNTER — Telehealth: Payer: Self-pay | Admitting: *Deleted

## 2019-03-23 NOTE — Telephone Encounter (Signed)
Received call from [place] with [facility] rehab. Reports that patient states he has blood in stools.   Call placed to patient . Reports that he has bright red drops of blood noted after BM. States that commode water is stained pink by blood. Denies loose stools, hard stools, or black tarry stools.   Reports that he has been off pain medications for a few days with no increase in pain.   States that he has been taking ASA, but held it today D/Y blood.   Call placed to [facility]. Message to be left with Dr. Rosalie Gums in regards to blood. GI will contact patient to follow up.

## 2019-03-24 ENCOUNTER — Other Ambulatory Visit: Payer: Self-pay

## 2019-03-24 ENCOUNTER — Encounter (INDEPENDENT_AMBULATORY_CARE_PROVIDER_SITE_OTHER): Payer: Self-pay

## 2019-03-24 DIAGNOSIS — Z4802 Encounter for removal of sutures: Secondary | ICD-10-CM

## 2019-03-24 DIAGNOSIS — K625 Hemorrhage of anus and rectum: Secondary | ICD-10-CM | POA: Diagnosis not present

## 2019-03-25 ENCOUNTER — Other Ambulatory Visit: Payer: Self-pay | Admitting: Surgery

## 2019-03-25 DIAGNOSIS — Z951 Presence of aortocoronary bypass graft: Secondary | ICD-10-CM

## 2019-03-29 ENCOUNTER — Ambulatory Visit (INDEPENDENT_AMBULATORY_CARE_PROVIDER_SITE_OTHER): Payer: Medicare Other | Admitting: Cardiovascular Disease

## 2019-03-29 ENCOUNTER — Other Ambulatory Visit: Payer: Self-pay

## 2019-03-29 ENCOUNTER — Encounter: Payer: Self-pay | Admitting: Cardiovascular Disease

## 2019-03-29 VITALS — BP 109/74 | HR 70 | Temp 98.1°F | Ht 72.0 in | Wt 182.0 lb

## 2019-03-29 DIAGNOSIS — E785 Hyperlipidemia, unspecified: Secondary | ICD-10-CM | POA: Diagnosis not present

## 2019-03-29 DIAGNOSIS — I25708 Atherosclerosis of coronary artery bypass graft(s), unspecified, with other forms of angina pectoris: Secondary | ICD-10-CM

## 2019-03-29 DIAGNOSIS — I2510 Atherosclerotic heart disease of native coronary artery without angina pectoris: Secondary | ICD-10-CM

## 2019-03-29 DIAGNOSIS — J449 Chronic obstructive pulmonary disease, unspecified: Secondary | ICD-10-CM | POA: Diagnosis not present

## 2019-03-29 NOTE — Patient Instructions (Addendum)
Medication Instructions:  Try taking aspirin 81 mg daily  Labwork:  None today Procedures/Testing:  None today Follow-Up:  3 months with [place] PA-C  Any Additional Special Instructions Will Be Listed Below (If Applicable).     If you need a refill on your cardiac medications before your next appointment, please call your pharmacy.     Thank you for choosing [facility] !
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
## 2019-04-05 ENCOUNTER — Ambulatory Visit (INDEPENDENT_AMBULATORY_CARE_PROVIDER_SITE_OTHER): Payer: Self-pay | Admitting: Physician Assistant

## 2019-04-05 ENCOUNTER — Ambulatory Visit
Admission: RE | Admit: 2019-04-05 | Discharge: 2019-04-05 | Disposition: A | Discharge status: Home / Self Care | Payer: Medicare Other | Source: Ambulatory Visit | Attending: Surgery | Admitting: Surgery

## 2019-04-05 ENCOUNTER — Other Ambulatory Visit: Payer: Self-pay

## 2019-04-05 VITALS — BP 109/69 | HR 66 | Temp 97.2°F | Resp 18 | Ht 72.0 in | Wt 182.0 lb

## 2019-04-05 DIAGNOSIS — Z951 Presence of aortocoronary bypass graft: Secondary | ICD-10-CM | POA: Diagnosis not present

## 2019-04-05 DIAGNOSIS — J90 Pleural effusion, not elsewhere classified: Secondary | ICD-10-CM | POA: Diagnosis not present

## 2019-04-05 DIAGNOSIS — J984 Other disorders of lung: Secondary | ICD-10-CM | POA: Diagnosis not present

## 2019-04-05 NOTE — Progress Notes (Signed)
HPI:  Patient returns for routine postoperative follow-up having undergone CABG x 4 on 03/01/2019. The patient's early postoperative recovery while in the hospital was unremarkable.  Since hospital discharge the patient reports he is having significant chest discomfort.  He states he is also experiencing numbness in his right and left thigh.  He was given a refill on his Dilaudid by his PCP and he is using this sparingly.  He has been walking around his house, but states that he is unable to ambulate very far due to discomfort.  Activity wise he has been vacuuming, washing dishes, doing laundry and carrying baskets.  He has lost weight since he left the hospital due to not having a full appetite back, however he is very happy he has been able to lose weight.  He has experienced some rectal bleeding which resolved with cessation of ASA.  He is taking an 81 mg dose currently without difficulty and he will follow up with GI the first week of August.   Current Outpatient Medications  Medication Sig Dispense Refill  . acetaminophen (TYLENOL) 500 MG tablet Take 1,000 mg by mouth every 6 (six) hours as needed for moderate pain or headache.    . albuterol (PROVENTIL) (2.5 MG/3ML) 0.083% nebulizer solution Take 3 mLs (2.5 mg total) by nebulization every 6 (six) hours as needed for wheezing or shortness of breath. 75 mL 2  . albuterol (VENTOLIN HFA) 108 (90 Base) MCG/ACT inhaler Inhale 2 puffs into the lungs every 6 (six) hours as needed for wheezing or shortness of breath.    Marland Kitchen aspirin EC 325 MG EC tablet Take 1 tablet (325 mg total) by mouth daily.    . colestipol (COLESTID) 1 g tablet Take 1 tablet (1 g total) by mouth 2 (two) times daily. (Patient taking differently: Take 2 g by mouth 2 (two) times daily. Patient takes 2 tablets two times a day) 60 tablet 3  . fluticasone furoate-vilanterol (BREO ELLIPTA) 100-25 MCG/INH AEPB Inhale 1 puff into the lungs daily. For breathing 30 each 11  . HYDROmorphone  (DILAUDID) 2 MG tablet Take 1 tablet (2 mg total) by mouth every 6 (six) hours as needed for severe pain. 60 tablet 0  . metoprolol tartrate (LOPRESSOR) 25 MG tablet Take 0.5 tablets (12.5 mg total) by mouth 2 (two) times daily. 30 tablet 1  . RABEprazole (ACIPHEX) 20 MG tablet Take 20 mg by mouth daily before breakfast.     . tamsulosin (FLOMAX) 0.4 MG CAPS capsule Take 2 capsules (0.8 mg total) by mouth daily after supper. For prostate (Patient taking differently: Take 0.8 mg by mouth daily before breakfast. For prostate) 180 capsule 2   No current facility-administered medications for this visit.     Physical Exam:  BP 109/69 (BP Location: Right Arm, Patient Position: Sitting, Cuff Size: Normal)   Pulse 66   Temp (!) 97.2 F (36.2 C)   Resp 18   Ht 6' (1.829 m)   Wt 182 lb (82.6 kg)   SpO2 94% Comment: RA  BMI 24.68 kg/m   Gen: no apparent distress Heart: RRR Lungs: CTA bilaterally Incisions: very well healed, sternum is stable Ext: no edema present, no groin hematoma  Diagnostic Tests:  CXR: resolution of previous atelectasis, pleural effusions, sternal wires intact   A/P:  1. S/p CABG x 4 doing very well, remains hemodynamically stable 2. Increased post operative pain--- I think this is related to the patient's activity level.  He should not have been vacuuming  or lifting laundry baskets for at least 6 weeks from date of surgery.  He is also sleeping on his side which can increase his pain..... I addressed a decreased activity level for the next 2 weeks to hopefully get his pain under control.  By 8/3 he would be 6 weeks out from surgery and he can resume increasing his activity should his pain level be resolved... okay to continue dilaudid prn  3. BLE thigh parasthesias- should improve with time, likely due to [facility] harvest on right, Left side is likely related to positioning 4. RTC in 4 weeks if pain has not resolved, if having no further pain, the patient can cancel his  appointment, okay to start cardiac rehab once patient's pain has resolved  Ellwood Handler, PA-C [facility] [phone]

## 2019-04-05 NOTE — Patient Instructions (Signed)
You may return to driving an automobile as long as you are no longer requiring oral narcotic pain relievers during the daytime.  It would be wise to start driving only short distances during the daylight and gradually increase from there as you feel comfortable.  Make every effort to maintain a "heart-healthy" lifestyle with regular physical exercise and adherence to a low-fat, low-carbohydrate diet.  Continue to seek regular follow-up appointments with your primary care physician and/or cardiologist.  You may continue to gradually increase your physical activity as tolerated.  Refrain from any heavy lifting or strenuous use of your arms and shoulders until at least 8 weeks from the time of your surgery, and avoid activities that cause increased pain in your chest on the side of your surgical incision.  Otherwise you may continue to increase activities without any particular limitations.  Increase the intensity and duration of physical activity gradually.   

## 2019-04-07 ENCOUNTER — Ambulatory Visit: Payer: Medicare Other | Admitting: Surgery

## 2019-04-20 ENCOUNTER — Other Ambulatory Visit: Payer: Self-pay

## 2019-04-21 ENCOUNTER — Ambulatory Visit (INDEPENDENT_AMBULATORY_CARE_PROVIDER_SITE_OTHER): Payer: Medicare Other | Admitting: Family Medicine

## 2019-04-21 ENCOUNTER — Encounter: Payer: Self-pay | Admitting: Family Medicine

## 2019-04-21 VITALS — BP 110/62 | HR 78 | Temp 98.2°F | Resp 16 | Ht 72.0 in | Wt 175.0 lb

## 2019-04-21 DIAGNOSIS — I2510 Atherosclerotic heart disease of native coronary artery without angina pectoris: Secondary | ICD-10-CM | POA: Diagnosis not present

## 2019-04-21 DIAGNOSIS — L570 Actinic keratosis: Secondary | ICD-10-CM | POA: Diagnosis not present

## 2019-04-21 DIAGNOSIS — Z23 Encounter for immunization: Secondary | ICD-10-CM

## 2019-04-21 NOTE — Progress Notes (Signed)
   Subjective:    Patient ID: Benjamin [name], male    DOB: Jul 15, 1941, 78 y.o.   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   still feels pulling sensation in chest, some numb areas, feels top of thighs are numb at time especially right side, no back pain, no change in bowel or bladder     Has GI appointment tomorrow - no recent blood in stool   AK- due for cyrotherapy on left today     Due for last immunizations       Review Of Systems:  GEN- + fatigue, denies  fever, weight loss,weakness, recent illness HEENT- denies eye drainage, change in vision, nasal discharge, CVS- denies chest pain, palpitations RESP- denies SOB, cough, wheeze ABD- denies N/V, change in stools, abd pain GU- denies dysuria, hematuria, dribbling, incontinence MSK- denies joint pain, +muscle aches, injury Neuro- denies headache, dizziness, syncope, seizure activity       Objective:    BP 110/62   Pulse 78   Temp 98.2 F (36.8 C) (Oral)   Resp 16   Ht 6' (1.829 m)   Wt 175 lb (79.4 kg)   SpO2 94%   BMI 23.73 kg/m  GEN- NAD, alert and oriented x3 HEENT- PERRL, EOMI, non injected sclera, pink conjunctiva, MMM, oropharynx clear CVS- RRR, no murmur RESP-CTAB ABD-NABS,soft,NT,ND Skin- left thigh- previous biopsy site healed, mild erythematous macular scar in place EXT- No edema Neuro- improved gait, was in wheelchair, sensation grossly in tact, decreased strength in general Pulses- Radial  2+        Assessment & Plan:      Problem List Items Addressed This Visit      Unprioritized   AK (actinic keratosis) - Primary    Cryotherapy via spray gun today with liquid nitrogen  discussed procedure with pt, verbal consent obtained  applied to lesion 5-10 sec spray, x 3 passes Tolerated procedure well Advised may blister Can use antibiotic ointment OTC , bandaid      CAD (coronary artery disease)    S/p CABG advised will take time to heal and improve strength and overall symptoms. He looks much better today than last month Reviewed CT vascular note He is cutting down use of narcotic medication Will not plan to keep long term       Relevant Medications   aspirin EC 81 MG tablet    Other Visit Diagnoses    Need for prophylactic vaccination against hepatitis A and hepatitis B       Relevant Orders   Hepatitis A hepatitis B combined vaccine IM (Completed)      Note: This dictation was prepared with Dragon dictation along with smaller phrase technology. Any transcriptional errors that result from this process are unintentional.

## 2019-04-21 NOTE — Patient Instructions (Signed)
F/U 4 months  

## 2019-04-22 ENCOUNTER — Encounter: Payer: Self-pay | Admitting: Family Medicine

## 2019-04-22 DIAGNOSIS — R195 Other fecal abnormalities: Secondary | ICD-10-CM | POA: Diagnosis not present

## 2019-04-22 DIAGNOSIS — K219 Gastro-esophageal reflux disease without esophagitis: Secondary | ICD-10-CM | POA: Diagnosis not present

## 2019-04-22 DIAGNOSIS — K625 Hemorrhage of anus and rectum: Secondary | ICD-10-CM | POA: Diagnosis not present

## 2019-04-22 DIAGNOSIS — Z8719 Personal history of other diseases of the digestive system: Secondary | ICD-10-CM | POA: Diagnosis not present

## 2019-04-22 DIAGNOSIS — K580 Irritable bowel syndrome with diarrhea: Secondary | ICD-10-CM | POA: Diagnosis not present

## 2019-04-22 DIAGNOSIS — Z86010 Personal history of colonic polyps: Secondary | ICD-10-CM | POA: Diagnosis not present

## 2019-04-22 NOTE — Assessment & Plan Note (Signed)
S/p CABG advised will take time to heal and improve strength and overall symptoms. He looks much better today than last month Reviewed CT vascular note He is cutting down use of narcotic medication Will not plan to keep long term

## 2019-04-22 NOTE — Assessment & Plan Note (Signed)
Cryotherapy via spray gun today with liquid nitrogen  discussed procedure with pt, verbal consent obtained  applied to lesion 5-10 sec spray, x 3 passes Tolerated procedure well Advised may blister Can use antibiotic ointment OTC , bandaid

## 2019-04-30 DIAGNOSIS — M79644 Pain in right finger(s): Secondary | ICD-10-CM | POA: Diagnosis not present

## 2019-05-05 ENCOUNTER — Other Ambulatory Visit: Payer: Self-pay

## 2019-05-05 ENCOUNTER — Ambulatory Visit (INDEPENDENT_AMBULATORY_CARE_PROVIDER_SITE_OTHER): Payer: Self-pay | Admitting: Surgery

## 2019-05-05 VITALS — BP 113/74 | HR 80 | Temp 97.8°F | Resp 20 | Ht 72.0 in | Wt 178.0 lb

## 2019-05-05 DIAGNOSIS — Z951 Presence of aortocoronary bypass graft: Secondary | ICD-10-CM

## 2019-05-05 DIAGNOSIS — I2510 Atherosclerotic heart disease of native coronary artery without angina pectoris: Secondary | ICD-10-CM

## 2019-05-06 ENCOUNTER — Encounter: Payer: Self-pay | Admitting: Surgery

## 2019-05-06 NOTE — Progress Notes (Signed)
HPI:  The patient returns for follow-up status post coronary bypass graft surgery x4 on 03/01/2019.  He had an uneventful postoperative recovery and was seen in our office on 04/05/2019 by one of our physician assistants.  His only complaint at this time is of persistent chest wall discomfort around the sternotomy incision.  He said that this does not bother him if he is still but if he is trying to do any kind of activity or ambulate he has significant discomfort.  He denies any anginal type chest pain or shortness of breath.  His stamina has continued to improve.  Current Outpatient Medications  Medication Sig Dispense Refill  . acetaminophen (TYLENOL) 500 MG tablet Take 1,000 mg by mouth every 6 (six) hours as needed for moderate pain or headache.    . albuterol (PROVENTIL) (2.5 MG/3ML) 0.083% nebulizer solution Take 3 mLs (2.5 mg total) by nebulization every 6 (six) hours as needed for wheezing or shortness of breath. 75 mL 2  . albuterol (VENTOLIN HFA) 108 (90 Base) MCG/ACT inhaler Inhale 2 puffs into the lungs every 6 (six) hours as needed for wheezing or shortness of breath.    Marland Kitchen aspirin EC 81 MG tablet Take 81 mg by mouth daily.    . colestipol (COLESTID) 1 g tablet Take 1 tablet (1 g total) by mouth 2 (two) times daily. (Patient taking differently: Take 2 g by mouth 2 (two) times daily. Patient takes 2 tablets two times a day) 60 tablet 3  . fluticasone furoate-vilanterol (BREO ELLIPTA) 100-25 MCG/INH AEPB Inhale 1 puff into the lungs daily. For breathing (Patient taking differently: Inhale 1 puff into the lungs daily as needed. For breathing as needed) 30 each 11  . HYDROmorphone (DILAUDID) 2 MG tablet Take 1 tablet (2 mg total) by mouth every 6 (six) hours as needed for severe pain. 60 tablet 0  . metoprolol tartrate (LOPRESSOR) 25 MG tablet Take 0.5 tablets (12.5 mg total) by mouth 2 (two) times daily. 30 tablet 1  . tamsulosin (FLOMAX) 0.4 MG CAPS capsule Take 2 capsules (0.8 mg  total) by mouth daily after supper. For prostate (Patient taking differently: Take 0.8 mg by mouth daily before breakfast. For prostate) 180 capsule 2   No current facility-administered medications for this visit.      Physical Exam: BP 113/74   Pulse 80   Temp 97.8 F (36.6 C) (Skin)   Resp 20   Ht 6' (1.829 m)   Wt 178 lb (80.7 kg)   SpO2 95% Comment: RA  BMI 24.14 kg/m  He looks well. Cardiac exam shows a regular rate and rhythm with normal heart sounds. Lungs are clear. The chest incision is well-healed and sternum is stable. His leg incision is well-healed and there is no peripheral edema.  Diagnostic Tests:  Chest x-ray from 04/05/2019 showed clear lungs and a tiny left pleural effusion.  Impression:  Overall I think he is making an excellent recovery following his surgery.  He still has significant chest wall discomfort but I would expect this to continue improving since he is only 2 months following surgery.  I discussed the possibility that this may continue to be a problem for him and if it lasts beyond 3 to 4 months then we could consider removing his sternal wires which may be exacerbating the pain.  I encouraged him to continue walking as much as possible.  I advised him against doing any lifting of more than 10 pounds for 3  months postoperatively.  I think it would be fine for him to start cardiac rehab.  Plan:  He will return to see me in 1 to 2 months if the chest wall discomfort does not continue to improve.  If this completely resolves then he will continue to follow-up with his PCP and cardiologist.   [name], MD [facility] [phone]

## 2019-05-13 ENCOUNTER — Telehealth: Payer: Self-pay | Admitting: Family Medicine

## 2019-05-13 NOTE — Telephone Encounter (Signed)
Clara called in stating that patient is almost out of the pain medication that you gave him status post his coronary bypass x4. States that he only has 4 pills left. She wanted to know if you can call in more or if she needs to come in for an office visit. States he is still in pain and cardiologist recommended giving it a few more months and if still in pain he would need to have the wires removed. Please advise? Would you like to see him on tomorrow 05/14/2019 we have no availability?

## 2019-05-14 MED ORDER — HYDROMORPHONE HCL 2 MG PO TABS: 2.0000 mg | ORAL_TABLET | Freq: Four times a day (QID) | ORAL | 0 refills | Status: DC | PRN

## 2019-05-14 NOTE — Telephone Encounter (Signed)
Spoke with Benjamin [name] and informed her that medication was sent to patient's pharmacy. She verbalized understanding.

## 2019-05-14 NOTE — Telephone Encounter (Signed)
Pain medication refilled

## 2019-05-24 ENCOUNTER — Other Ambulatory Visit: Payer: Self-pay | Admitting: Surgical

## 2019-05-25 ENCOUNTER — Other Ambulatory Visit: Payer: Self-pay

## 2019-05-25 ENCOUNTER — Ambulatory Visit (INDEPENDENT_AMBULATORY_CARE_PROVIDER_SITE_OTHER): Payer: Medicare Other | Admitting: *Deleted

## 2019-05-25 DIAGNOSIS — Z23 Encounter for immunization: Secondary | ICD-10-CM

## 2019-05-25 NOTE — Progress Notes (Signed)
Patient seen in office for Influenza Vaccination.   Tolerated IM administration well.   Immunization history updated.  

## 2019-05-27 ENCOUNTER — Other Ambulatory Visit: Payer: Self-pay | Admitting: *Deleted

## 2019-05-27 MED ORDER — METOPROLOL TARTRATE 25 MG PO TABS: 12.5000 mg | ORAL_TABLET | Freq: Two times a day (BID) | ORAL | 1 refills | Status: DC

## 2019-06-17 ENCOUNTER — Other Ambulatory Visit: Payer: Self-pay | Admitting: *Deleted

## 2019-06-17 DIAGNOSIS — R0789 Other chest pain: Secondary | ICD-10-CM

## 2019-06-22 ENCOUNTER — Other Ambulatory Visit: Payer: Self-pay

## 2019-06-22 ENCOUNTER — Other Ambulatory Visit (HOSPITAL_COMMUNITY)
Admission: RE | Admit: 2019-06-22 | Discharge: 2019-06-22 | Disposition: A | Discharge status: Home / Self Care | Payer: Medicare Other | Source: Ambulatory Visit | Attending: Surgery | Admitting: Surgery

## 2019-06-22 DIAGNOSIS — Z20828 Contact with and (suspected) exposure to other viral communicable diseases: Secondary | ICD-10-CM | POA: Diagnosis not present

## 2019-06-22 DIAGNOSIS — Z01812 Encounter for preprocedural laboratory examination: Secondary | ICD-10-CM | POA: Insufficient documentation

## 2019-06-22 LAB — COMPREHENSIVE METABOLIC PANEL
ALT: 28 U/L (ref 0–44)
AST: 30 U/L (ref 15–41)
Albumin: 4.3 g/dL (ref 3.5–5.0)
Alkaline Phosphatase: 55 U/L (ref 38–126)
Anion gap: 7 (ref 5–15)
BUN: 18 mg/dL (ref 8–23)
CO2: 26 mmol/L (ref 22–32)
Calcium: 9.2 mg/dL (ref 8.9–10.3)
Chloride: 106 mmol/L (ref 98–111)
Creatinine, Ser: 0.99 mg/dL (ref 0.61–1.24)
GFR calc Af Amer: >60 mL/min (ref >60)
GFR calc non Af Amer: >60 mL/min (ref >60)
Glucose, Bld: 128 mg/dL — ABNORMAL HIGH (ref 70–99)
Potassium: 4.3 mmol/L (ref 3.5–5.1)
Sodium: 139 mmol/L (ref 135–145)
Total Bilirubin: 1.3 mg/dL — ABNORMAL HIGH (ref 0.3–1.2)
Total Protein: 7.4 g/dL (ref 6.5–8.1)

## 2019-06-22 LAB — CBC
HCT: 47.6 % (ref 39.0–52.0)
Hemoglobin: 15 g/dL (ref 13.0–17.0)
MCH: 30.5 pg (ref 26.0–34.0)
MCHC: 31.5 g/dL (ref 30.0–36.0)
MCV: 96.9 fL (ref 80.0–100.0)
Platelets: 275 10*3/uL (ref 150–400)
RBC: 4.91 MIL/uL (ref 4.22–5.81)
RDW: 12.9 % (ref 11.5–15.5)
WBC: 6 10*3/uL (ref 4.0–10.5)
nRBC: 0 % (ref 0.0–0.2)

## 2019-06-22 LAB — SARS CORONAVIRUS 2 (TAT 6-24 HRS): SARS Coronavirus 2: NEGATIVE

## 2019-06-22 LAB — TYPE AND SCREEN
ABO/RH(D): A POS
Antibody Screen: NEGATIVE

## 2019-06-22 LAB — APTT: aPTT: 26 seconds (ref 24–36)

## 2019-06-22 LAB — PROTIME-INR
INR: 1.1 (ref 0.8–1.2)
Prothrombin Time: 13.6 seconds (ref 11.4–15.2)

## 2019-06-22 MED ORDER — METOPROLOL TARTRATE 25 MG PO TABS: 12.5000 mg | ORAL_TABLET | Freq: Two times a day (BID) | ORAL | 3 refills | Status: DC

## 2019-06-22 NOTE — Telephone Encounter (Signed)
refilled lopressor 

## 2019-06-23 ENCOUNTER — Ambulatory Visit: Payer: Medicare Other | Admitting: Surgery

## 2019-06-23 ENCOUNTER — Other Ambulatory Visit: Payer: Self-pay

## 2019-06-23 ENCOUNTER — Encounter (HOSPITAL_COMMUNITY): Payer: Self-pay | Admitting: *Deleted
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
## 2019-06-23 NOTE — Progress Notes (Signed)
Pt denies any acute cardiopulmonary issues. Pt stated that a nuclear stress test was performed in June  " around the time all of the other tests were performed."  Pt stated that he has had a runny nose since  " around June 15 when I had my surgery."  Pt made aware to stop taking vitamins, fish oil and herbal medications. Do not take any NSAIDs ie: Ibuprofen, Advil, Naproxen (Aleve), Motrin, BC and Goody Powder. Pt stated that he is not taking Colestipol now.  Pt verbalized understanding of all pre-op instructions. PA,  Anesthesiology, asked to review pt history; see note in Epic.

## 2019-06-23 NOTE — Progress Notes (Signed)
Anesthesia Chart Review: Same day workup  Follows with cardiology for CAD s/p CABG x 4 on 03/01/2019. Per cardiology and CT surgery notes his overall recovery has gone quite well, stamina improving, DOE improved, no anginal pain. He continues to have severe sternal pain related to sternal wires. He has now been recommended to have wires removed.   CBC and CMP from 06/22/19 reviewed and are WNL   TTE 10/23/18 (pt subsequently had CABG x 4 on 03/01/19):  1. The left ventricle has normal systolic function of 0000000. The cavity size was normal. There is borderline increase in left ventricular wall thickness. Echo evidence of impaired diastolic relaxation.  2. The right ventricle has normal systolic function. The cavity was normal. There is no increase in right ventricular wall thickness. Right ventricular systolic pressure could not be assessed.  3. The aortic valve is tricuspid There is mild aortic annular calcification noted.  4. The mitral valve is normal in structure.  5. The tricuspid valve is normal in structure.  6. The aortic root is normal in size and structure.  7. No evidence of left ventricular regional wall motion abnormalities.    Wynonia Musty [facility]/Anesthesiology Phone [phone] 06/23/2019 11:06 AM

## 2019-06-23 NOTE — Anesthesia Preprocedure Evaluation (Addendum)
Anesthesia Evaluation  Patient identified by MRN, date of birth, ID band Patient awake    Reviewed: Allergy & Precautions, NPO status , Patient's Chart, lab work & pertinent test results, reviewed documented beta blocker date and time   Airway Mallampati: III  TM Distance: >3 FB Neck ROM: Full    Dental  (+) Edentulous Upper, Edentulous Lower   Pulmonary COPD,  COPD inhaler, former smoker,    Pulmonary exam normal breath sounds clear to auscultation       Cardiovascular + CAD, + CABG and +CHF  Normal cardiovascular exam Rhythm:Regular Rate:Normal  ECG: NSR, rate 85  ECHO: Left ventricle: LV systolic function is low normal. Aortic valve: The valve is trileaflet. Mitral valve: Trace regurgitation.       Neuro/Psych  Headaches, negative psych ROS   GI/Hepatic Neg liver ROS, PUD,   Endo/Other  negative endocrine ROS  Renal/GU negative Renal ROS     Musculoskeletal negative musculoskeletal ROS (+)   Abdominal   Peds  Hematology negative hematology ROS (+)   Anesthesia Other Findings STERNAL PAIN  Reproductive/Obstetrics                           Anesthesia Physical Anesthesia Plan  ASA: III  Anesthesia Plan: General   Post-op Pain Management:    Induction: Intravenous  PONV Risk Score and Plan: 2 and Ondansetron, Dexamethasone, Midazolam and Treatment may vary due to age or medical condition  Airway Management Planned: LMA  Additional Equipment:   Intra-op Plan:   Post-operative Plan: Extubation in OR  Informed Consent: I have reviewed the patients History and Physical, chart, labs and discussed the procedure including the risks, benefits and alternatives for the proposed anesthesia with the patient or authorized representative who has indicated his/her understanding and acceptance.       Plan Discussed with: CRNA  Anesthesia Plan Comments: (Per PA-C: [id number] with  cardiology for CAD s/p CABG x 4 on 03/01/2019. Per cardiology and CT surgery notes his overall recovery has gone quite well, stamina improving, DOE improved, no anginal pain. He continues to have severe sternal pain related to sternal wires. He has now been recommended to have wires removed.   CBC and CMP from 06/22/19 reviewed and are WNL   TTE 10/23/18 (pt subsequently had CABG x 4 on 03/01/19):  1. The left ventricle has normal systolic function of 0000000. The cavity size was normal. There is borderline increase in left ventricular wall thickness. Echo evidence of impaired diastolic relaxation.  2. The right ventricle has normal systolic function. The cavity was normal. There is no increase in right ventricular wall thickness. Right ventricular systolic pressure could not be assessed.  3. The aortic valve is tricuspid There is mild aortic annular calcification noted.  4. The mitral valve is normal in structure.  5. The tricuspid valve is normal in structure.  6. The aortic root is normal in size and structure.  7. No evidence of left ventricular regional wall motion abnormalities.)     Anesthesia Quick Evaluation

## 2019-06-24 ENCOUNTER — Encounter (HOSPITAL_COMMUNITY)
Admission: RE | Disposition: A | Discharge status: Home / Self Care | Payer: Self-pay | Source: Home / Self Care | Attending: Surgery

## 2019-06-24 ENCOUNTER — Ambulatory Visit (HOSPITAL_COMMUNITY): Payer: Medicare Other

## 2019-06-24 ENCOUNTER — Ambulatory Visit (HOSPITAL_COMMUNITY): Payer: Medicare Other | Admitting: Certified Registered Nurse Anesthetist

## 2019-06-24 ENCOUNTER — Encounter (HOSPITAL_COMMUNITY): Payer: Self-pay

## 2019-06-24 ENCOUNTER — Ambulatory Visit (HOSPITAL_COMMUNITY)
Admission: RE | Admit: 2019-06-24 | Discharge: 2019-06-24 | Disposition: A | Discharge status: Home / Self Care | Payer: Medicare Other | Attending: Surgery | Admitting: Surgery

## 2019-06-24 DIAGNOSIS — M159 Polyosteoarthritis, unspecified: Secondary | ICD-10-CM | POA: Diagnosis not present

## 2019-06-24 DIAGNOSIS — I509 Heart failure, unspecified: Secondary | ICD-10-CM | POA: Insufficient documentation

## 2019-06-24 DIAGNOSIS — Z888 Allergy status to other drugs, medicaments and biological substances status: Secondary | ICD-10-CM | POA: Diagnosis not present

## 2019-06-24 DIAGNOSIS — R0989 Other specified symptoms and signs involving the circulatory and respiratory systems: Secondary | ICD-10-CM | POA: Diagnosis not present

## 2019-06-24 DIAGNOSIS — N400 Enlarged prostate without lower urinary tract symptoms: Secondary | ICD-10-CM | POA: Insufficient documentation

## 2019-06-24 DIAGNOSIS — I2510 Atherosclerotic heart disease of native coronary artery without angina pectoris: Secondary | ICD-10-CM | POA: Diagnosis not present

## 2019-06-24 DIAGNOSIS — T82847A Pain from cardiac prosthetic devices, implants and grafts, initial encounter: Secondary | ICD-10-CM | POA: Diagnosis not present

## 2019-06-24 DIAGNOSIS — Z87891 Personal history of nicotine dependence: Secondary | ICD-10-CM | POA: Diagnosis not present

## 2019-06-24 DIAGNOSIS — J449 Chronic obstructive pulmonary disease, unspecified: Secondary | ICD-10-CM | POA: Insufficient documentation

## 2019-06-24 DIAGNOSIS — R0789 Other chest pain: Secondary | ICD-10-CM

## 2019-06-24 DIAGNOSIS — Z09 Encounter for follow-up examination after completed treatment for conditions other than malignant neoplasm: Secondary | ICD-10-CM

## 2019-06-24 DIAGNOSIS — Z01811 Encounter for preprocedural respiratory examination: Secondary | ICD-10-CM | POA: Diagnosis not present

## 2019-06-24 DIAGNOSIS — T8189XA Other complications of procedures, not elsewhere classified, initial encounter: Secondary | ICD-10-CM | POA: Diagnosis not present

## 2019-06-24 DIAGNOSIS — Z951 Presence of aortocoronary bypass graft: Secondary | ICD-10-CM | POA: Diagnosis not present

## 2019-06-24 HISTORY — PX: STERNAL WIRES REMOVAL: SHX2441

## 2019-06-24 HISTORY — DX: Other chest pain: R07.89

## 2019-06-24 LAB — COMPREHENSIVE METABOLIC PANEL
ALT: 22 U/L (ref 0–44)
AST: 27 U/L (ref 15–41)
Albumin: 3.8 g/dL (ref 3.5–5.0)
Alkaline Phosphatase: 52 U/L (ref 38–126)
Anion gap: 6 (ref 5–15)
BUN: 22 mg/dL (ref 8–23)
CO2: 24 mmol/L (ref 22–32)
Calcium: 9 mg/dL (ref 8.9–10.3)
Chloride: 110 mmol/L (ref 98–111)
Creatinine, Ser: 1.1 mg/dL (ref 0.61–1.24)
GFR calc Af Amer: >60 mL/min (ref >60)
GFR calc non Af Amer: >60 mL/min (ref >60)
Glucose, Bld: 112 mg/dL — ABNORMAL HIGH (ref 70–99)
Potassium: 3.9 mmol/L (ref 3.5–5.1)
Sodium: 140 mmol/L (ref 135–145)
Total Bilirubin: 1.1 mg/dL (ref 0.3–1.2)
Total Protein: 6.3 g/dL — ABNORMAL LOW (ref 6.5–8.1)

## 2019-06-24 LAB — APTT: aPTT: 26 seconds (ref 24–36)

## 2019-06-24 LAB — CBC
HCT: 43.9 % (ref 39.0–52.0)
Hemoglobin: 14.2 g/dL (ref 13.0–17.0)
MCH: 31.3 pg (ref 26.0–34.0)
MCHC: 32.3 g/dL (ref 30.0–36.0)
MCV: 96.7 fL (ref 80.0–100.0)
Platelets: 292 10*3/uL (ref 150–400)
RBC: 4.54 MIL/uL (ref 4.22–5.81)
RDW: 12.9 % (ref 11.5–15.5)
WBC: 5.6 10*3/uL (ref 4.0–10.5)
nRBC: 0 % (ref 0.0–0.2)

## 2019-06-24 LAB — TYPE AND SCREEN
ABO/RH(D): A POS
Antibody Screen: NEGATIVE

## 2019-06-24 LAB — PROTIME-INR
INR: 1.1 (ref 0.8–1.2)
Prothrombin Time: 13.8 seconds (ref 11.4–15.2)

## 2019-06-24 SURGERY — REMOVAL, STERNAL WIRE
Anesthesia: General | Site: Chest

## 2019-06-24 MED ORDER — DEXAMETHASONE SODIUM PHOSPHATE 10 MG/ML IJ SOLN
INTRAMUSCULAR | Status: DC | PRN
  Administered 2019-06-24: 5 mg via INTRAVENOUS

## 2019-06-24 MED ORDER — OXYCODONE HCL 5 MG PO TABS
ORAL_TABLET | ORAL | Status: AC
  Filled 2019-06-24: qty 2

## 2019-06-24 MED ORDER — FENTANYL CITRATE (PF) 250 MCG/5ML IJ SOLN
INTRAMUSCULAR | Status: AC
  Filled 2019-06-24: qty 5

## 2019-06-24 MED ORDER — EPHEDRINE SULFATE 50 MG/ML IJ SOLN
INTRAMUSCULAR | Status: DC | PRN
  Administered 2019-06-24 (×2): 10 mg via INTRAVENOUS

## 2019-06-24 MED ORDER — LACTATED RINGERS IV SOLN
INTRAVENOUS | Status: DC
  Administered 2019-06-24: 12:00:00 via INTRAVENOUS

## 2019-06-24 MED ORDER — ACETAMINOPHEN 500 MG PO TABS
1000.0000 mg | ORAL_TABLET | Freq: Once | ORAL | Status: AC
  Administered 2019-06-24: 1000 mg via ORAL
  Filled 2019-06-24: qty 2

## 2019-06-24 MED ORDER — PROPOFOL 10 MG/ML IV BOLUS
INTRAVENOUS | Status: AC
  Filled 2019-06-24: qty 20

## 2019-06-24 MED ORDER — ONDANSETRON HCL 4 MG/2ML IJ SOLN
INTRAMUSCULAR | Status: DC | PRN
  Administered 2019-06-24: 4 mg via INTRAVENOUS

## 2019-06-24 MED ORDER — CEFAZOLIN SODIUM-DEXTROSE 2-4 GM/100ML-% IV SOLN
2.0000 g | INTRAVENOUS | Status: AC
  Administered 2019-06-24: 2 g via INTRAVENOUS
  Filled 2019-06-24: qty 100

## 2019-06-24 MED ORDER — SODIUM CHLORIDE 0.9 % IV SOLN: INTRAVENOUS | Status: DC | PRN

## 2019-06-24 MED ORDER — FENTANYL CITRATE (PF) 250 MCG/5ML IJ SOLN
INTRAMUSCULAR | Status: DC | PRN
  Administered 2019-06-24 (×4): 50 ug via INTRAVENOUS

## 2019-06-24 MED ORDER — FENTANYL CITRATE (PF) 100 MCG/2ML IJ SOLN
INTRAMUSCULAR | Status: AC
  Filled 2019-06-24: qty 2

## 2019-06-24 MED ORDER — OXYCODONE HCL 5 MG PO TABS
10.0000 mg | ORAL_TABLET | ORAL | Status: DC | PRN
  Administered 2019-06-24: 10 mg via ORAL

## 2019-06-24 MED ORDER — PROPOFOL 500 MG/50ML IV EMUL
INTRAVENOUS | Status: DC | PRN
  Administered 2019-06-24: 150 mg via INTRAVENOUS

## 2019-06-24 MED ORDER — LIDOCAINE HCL (CARDIAC) PF 100 MG/5ML IV SOSY
PREFILLED_SYRINGE | INTRAVENOUS | Status: DC | PRN
  Administered 2019-06-24: 60 mg via INTRATRACHEAL

## 2019-06-24 MED ORDER — 0.9 % SODIUM CHLORIDE (POUR BTL) OPTIME
TOPICAL | Status: DC | PRN
  Administered 2019-06-24: 1000 mL

## 2019-06-24 MED ORDER — ONDANSETRON HCL 4 MG/2ML IJ SOLN: 4.0000 mg | Freq: Once | INTRAMUSCULAR | Status: DC | PRN

## 2019-06-24 MED ORDER — SODIUM CHLORIDE 0.9 % IV SOLN
INTRAVENOUS | Status: DC | PRN
  Administered 2019-06-24: 25 ug/min via INTRAVENOUS

## 2019-06-24 MED ORDER — FENTANYL CITRATE (PF) 100 MCG/2ML IJ SOLN
25.0000 ug | INTRAMUSCULAR | Status: DC | PRN
  Administered 2019-06-24: 50 ug via INTRAVENOUS

## 2019-06-24 SURGICAL SUPPLY — 59 items
ATTRACTOMAT 16X20 MAGNETIC DRP (DRAPES) ×1 IMPLANT
BAG DECANTER FOR FLEXI CONT (MISCELLANEOUS) ×1 IMPLANT
BLADE CLIPPER SURG (BLADE) ×1 IMPLANT
BLADE SURG 10 STRL SS (BLADE) ×3 IMPLANT
BLANKET WARM CARDIAC ADLT BAIR (MISCELLANEOUS) ×1 IMPLANT
BNDG GAUZE ELAST 4 BULKY (GAUZE/BANDAGES/DRESSINGS) IMPLANT
CANISTER SUCT 3000ML PPV (MISCELLANEOUS) ×2 IMPLANT
CATH THORACIC 28FR RT ANG (CATHETERS) IMPLANT
CATH THORACIC 36FR (CATHETERS) IMPLANT
CATH THORACIC 36FR RT ANG (CATHETERS) IMPLANT
CLIP VESOCCLUDE SM WIDE 24/CT (CLIP) IMPLANT
CONT SPEC 4OZ CLIKSEAL STRL BL (MISCELLANEOUS) IMPLANT
COVER SURGICAL LIGHT HANDLE (MISCELLANEOUS) ×5 IMPLANT
COVER WAND RF STERILE (DRAPES) ×1 IMPLANT
DERMABOND ADVANCED (GAUZE/BANDAGES/DRESSINGS) ×1
DERMABOND ADVANCED .7 DNX12 (GAUZE/BANDAGES/DRESSINGS) IMPLANT
DRAPE LAPAROSCOPIC ABDOMINAL (DRAPES) ×2 IMPLANT
DRAPE SLUSH/WARMER DISC (DRAPES) IMPLANT
ELECT REM PT RETURN 9FT ADLT (ELECTROSURGICAL) ×2
ELECTRODE REM PT RTRN 9FT ADLT (ELECTROSURGICAL) ×1 IMPLANT
GAUZE SPONGE 4X4 12PLY STRL (GAUZE/BANDAGES/DRESSINGS) ×1 IMPLANT
GAUZE XEROFORM 5X9 LF (GAUZE/BANDAGES/DRESSINGS) IMPLANT
GLOVE EUDERMIC 7 POWDERFREE (GLOVE) ×2 IMPLANT
GLOVE SKINSENSE STRL SZ6.0 (GLOVE) ×2 IMPLANT
GOWN STRL REUS W/ TWL LRG LVL3 (GOWN DISPOSABLE) ×4 IMPLANT
GOWN STRL REUS W/ TWL XL LVL3 (GOWN DISPOSABLE) ×1 IMPLANT
GOWN STRL REUS W/TWL LRG LVL3 (GOWN DISPOSABLE) ×2
GOWN STRL REUS W/TWL XL LVL3 (GOWN DISPOSABLE) ×1
HANDPIECE INTERPULSE COAX TIP (DISPOSABLE)
HEMOSTAT POWDER SURGIFOAM 1G (HEMOSTASIS) ×2 IMPLANT
KIT BASIN OR (CUSTOM PROCEDURE TRAY) ×2 IMPLANT
KIT SUCTION CATH 14FR (SUCTIONS) IMPLANT
KIT TURNOVER KIT B (KITS) ×2 IMPLANT
MARKER SKIN DUAL TIP RULER LAB (MISCELLANEOUS) IMPLANT
NS IRRIG 1000ML POUR BTL (IV SOLUTION) ×2 IMPLANT
PACK CHEST (CUSTOM PROCEDURE TRAY) ×1 IMPLANT
PACK GENERAL/GYN (CUSTOM PROCEDURE TRAY) ×1 IMPLANT
PAD ARMBOARD 7.5X6 YLW CONV (MISCELLANEOUS) ×4 IMPLANT
PIN SAFETY STERILE (MISCELLANEOUS) IMPLANT
RUBBERBAND STERILE (MISCELLANEOUS) IMPLANT
SET HNDPC FAN SPRY TIP SCT (DISPOSABLE) IMPLANT
SOL PREP POV-IOD 4OZ 10% (MISCELLANEOUS) IMPLANT
SPONGE LAP 18X18 RF (DISPOSABLE) ×2 IMPLANT
SUT STEEL 6MS V (SUTURE) IMPLANT
SUT STEEL STERNAL CCS#1 18IN (SUTURE) IMPLANT
SUT STEEL SZ 6 DBL 3X14 BALL (SUTURE) IMPLANT
SUT VIC AB 1 CTX 36 (SUTURE)
SUT VIC AB 1 CTX36XBRD ANBCTR (SUTURE) IMPLANT
SUT VIC AB 2-0 CTX 36 (SUTURE) IMPLANT
SUT VIC AB 3-0 SH 27 (SUTURE) ×2
SUT VIC AB 3-0 SH 27X BRD (SUTURE) IMPLANT
SUT VIC AB 3-0 X1 27 (SUTURE) ×2 IMPLANT
SWAB COLLECTION DEVICE MRSA (MISCELLANEOUS) IMPLANT
SWAB CULTURE ESWAB REG 1ML (MISCELLANEOUS) IMPLANT
SYSTEM SAHARA CHEST DRAIN ATS (WOUND CARE) ×1 IMPLANT
TOWEL GREEN STERILE (TOWEL DISPOSABLE) ×2 IMPLANT
TOWEL GREEN STERILE FF (TOWEL DISPOSABLE) ×2 IMPLANT
TRAY FOLEY MTR SLVR 14FR STAT (SET/KITS/TRAYS/PACK) ×1 IMPLANT
WATER STERILE IRR 1000ML POUR (IV SOLUTION) ×1 IMPLANT

## 2019-06-24 NOTE — Anesthesia Procedure Notes (Signed)
Procedure Name: LMA Insertion Date/Time: 06/24/2019 1:10 PM Performed by: Mariea Clonts, CRNA Pre-anesthesia Checklist: Patient identified, Emergency Drugs available, Suction available and Patient being monitored Patient Re-evaluated:Patient Re-evaluated prior to induction Oxygen Delivery Method: Circle system utilized Preoxygenation: Pre-oxygenation with 100% oxygen Induction Type: IV induction Ventilation: Mask ventilation without difficulty LMA: LMA inserted LMA Size: 4.0 Number of attempts: 1 Airway Equipment and Method: Bite block Placement Confirmation: positive ETCO2 Tube secured with: Tape Dental Injury: Teeth and Oropharynx as per pre-operative assessment

## 2019-06-24 NOTE — Discharge Instructions (Addendum)
You may shower. Incisions are covered with Dermabond surgical adhesive which is waterproof. Do not apply any creams or lotions to incisions.  May return to driving tomorrow.  No lifting more than 10 lbs for 2 weeks.

## 2019-06-24 NOTE — Transfer of Care (Signed)
Immediate Anesthesia Transfer of Care Note  Patient: Benjamin [name]  Procedure(s) Performed: STERNAL WIRES REMOVAL (N/A Chest)  Patient Location: PACU  Anesthesia Type:General  Level of Consciousness: awake  Airway & Oxygen Therapy: Patient Spontanous Breathing and Patient connected to face mask oxygen  Post-op Assessment: Report given to RN, Post -op Vital signs reviewed and stable and Patient moving all extremities X 4  Post vital signs: Reviewed and stable  Last Vitals:  Vitals Value Taken Time  BP 126/81 06/24/19 1433  Temp 36.9 C 06/24/19 1433  Pulse 80 06/24/19 1438  Resp 15 06/24/19 1438  SpO2 95 % 06/24/19 1438  Vitals shown include unvalidated device data.  Last Pain:  Vitals:   06/24/19 1433  TempSrc:   PainSc: (P) 3       Patients Stated Pain Goal: (P) 3 ([id number])  Complications: No apparent anesthesia complications

## 2019-06-24 NOTE — Op Note (Signed)
CARDIOVASCULAR SURGERY OPERATIVE NOTE  06/24/2019 Benjamin [name] [id number]  Surgeon:  [name], MD  First Assistant: none   Preoperative Diagnosis:  Persistent chest wall pain s/p CABG   Postoperative Diagnosis:  Same   Procedure:  Removal of sternal wires  Anesthesia:  General LMA   Clinical History/Surgical Indication:  The patient is a 78 year old gentleman who underwent coronary artery bypass graft surgery x4 on 03/01/2019.  He had an uneventful recovery but has continued to have persistent chest wall discomfort around the sternotomy incision.  He has been active and not having any exertional anginal type chest pain or shortness of breath.  His stamina is good.  I saw him in the office on 05/05/2019 and discussed the possibility that his chest wall pain may be due to the sternal wires and might be relieved by removing them. He is now almost 4 months following coronary artery bypass graft surgery through a median sternotomy incision and still has significant discomfort along the sternotomy incision exacerbated by movement.  This may be due to the sternal wires and I think the best option at this time is to remove them since this will likely resolve his pain.  I discussed the operative procedure with him including the possibility that he may continue to have some discomfort even with the wires removed.  I discussed the risk of infection and he understands and agrees to proceed.  Preparation:  The patient was seen in the preoperative holding area and the correct patient, correct operation were confirmed with the patient after reviewing the medical record and CXR. The consent was signed by me. Preoperative antibiotics were given. The patient was taken back to the operating room and positioned supine on the operating room table. After being placed under general LMA anesthesia by the anesthesia team the neck, chest, and abdomen were prepped with betadine soap and solution and draped in  the usual sterile manner. A surgical time-out was taken and the correct patient and operative procedure were confirmed with the nursing and anesthesia staff.   Removal of sternal wires:  A 1 cm incision was made centered over the manubrium along the line of the previous scar.  This was carried down through the subcutaneous tissue using electrocautery until the sternum was encountered.  Through this incision was able to locate all 3 manubrial wires.  These were untwisted and removed completely.  Then a second 1 cm incision was made over the upper half of the sternum within the previous scar and similarly carried down through the subcutaneous tissue using electrocautery until the sternum was encountered.  Through this incision to the sternal wires could be removed.  Then a third incision was made over the lower half of the sternum in a similar manner.  Through this incision I was able to remove the last 2 wires.  There was complete hemostasis.  The subcutaneous tissue was closed with interrupted 3-0 Vicryl suture and the skin was closed with continuous 3-0 Vicryl subcuticular closure.  Dermabond was applied.  Sponge needle and instrument counts were correct according the scrub nurse.  The patient was then awakened and transported to the postanesthesia care unit in satisfactory condition.

## 2019-06-24 NOTE — Interval H&P Note (Signed)
History and Physical Interval Note:  06/24/2019 12:19 PM  Benjamin [name]  has presented today for surgery, with the diagnosis of STERNAL PAIN.  The various methods of treatment have been discussed with the patient and family. After consideration of risks, benefits and other options for treatment, the patient has consented to  Procedure(s): STERNAL WIRES REMOVAL (N/A) as a surgical intervention.  The patient's history has been reviewed, patient examined, no change in status, stable for surgery.  I have reviewed the patient's chart and labs.  Questions were answered to the patient's satisfaction.     [name]

## 2019-06-25 ENCOUNTER — Encounter (HOSPITAL_COMMUNITY): Payer: Self-pay | Admitting: Surgery

## 2019-06-25 NOTE — Anesthesia Postprocedure Evaluation (Signed)
Anesthesia Post Note  Patient: Benjamin [name]  Procedure(s) Performed: STERNAL WIRES REMOVAL (N/A Chest)     Patient location during evaluation: PACU Anesthesia Type: General Level of consciousness: awake and alert Pain management: pain level controlled Vital Signs Assessment: post-procedure vital signs reviewed and stable Respiratory status: spontaneous breathing, nonlabored ventilation, respiratory function stable and patient connected to nasal cannula oxygen Cardiovascular status: blood pressure returned to baseline and stable Postop Assessment: no apparent nausea or vomiting Anesthetic complications: no    Last Vitals:  Vitals:   06/24/19 1518 06/24/19 1533  BP: 122/80 109/63  Pulse: 81 78  Resp: 15 18  Temp:    SpO2: 90% 95%    Last Pain:  Vitals:   06/24/19 1533  TempSrc:   PainSc: 3                  Jalaine Riggenbach P Kayde Warehime
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
## 2019-06-29 ENCOUNTER — Ambulatory Visit (INDEPENDENT_AMBULATORY_CARE_PROVIDER_SITE_OTHER): Payer: Medicare Other | Admitting: Student

## 2019-06-29 ENCOUNTER — Encounter: Payer: Self-pay | Admitting: Student

## 2019-06-29 ENCOUNTER — Other Ambulatory Visit: Payer: Self-pay

## 2019-06-29 VITALS — BP 108/73 | HR 61 | Temp 97.2°F | Ht 71.0 in | Wt 186.0 lb

## 2019-06-29 DIAGNOSIS — I2510 Atherosclerotic heart disease of native coronary artery without angina pectoris: Secondary | ICD-10-CM | POA: Diagnosis not present

## 2019-06-29 DIAGNOSIS — E785 Hyperlipidemia, unspecified: Secondary | ICD-10-CM | POA: Diagnosis not present

## 2019-06-29 DIAGNOSIS — J449 Chronic obstructive pulmonary disease, unspecified: Secondary | ICD-10-CM | POA: Diagnosis not present

## 2019-06-29 DIAGNOSIS — I10 Essential (primary) hypertension: Secondary | ICD-10-CM | POA: Diagnosis not present

## 2019-06-29 DIAGNOSIS — I25708 Atherosclerosis of coronary artery bypass graft(s), unspecified, with other forms of angina pectoris: Secondary | ICD-10-CM | POA: Diagnosis not present

## 2019-06-29 MED ORDER — EZETIMIBE 10 MG PO TABS: 10.0000 mg | ORAL_TABLET | Freq: Every day | ORAL | 5 refills | Status: DC

## 2019-06-29 NOTE — Patient Instructions (Signed)
Medication Instructions:  START ZETIA 10 MG ONCE DAILY   Labwork: 2 MONTHS  FASTING LIPID   Testing/Procedures: NONE  Follow-Up: Your physician wants you to follow-up in: 6 MONTHS.  You will receive a reminder letter in the mail two months in advance. If you don't receive a letter, please call our office to schedule the follow-up appointment.   Any Other Special Instructions Will Be Listed Below (If Applicable).     If you need a refill on your cardiac medications before your next appointment, please call your pharmacy.

## 2019-07-01 NOTE — Progress Notes (Signed)
82mcgs of Fentanyl wasted in the stericycle at this time with Duard Brady, RN.

## 2019-07-07 ENCOUNTER — Other Ambulatory Visit: Payer: Self-pay

## 2019-07-07 ENCOUNTER — Encounter: Payer: Self-pay | Admitting: Surgery

## 2019-07-07 ENCOUNTER — Ambulatory Visit (INDEPENDENT_AMBULATORY_CARE_PROVIDER_SITE_OTHER): Payer: Self-pay | Admitting: Surgery

## 2019-07-07 VITALS — BP 102/67 | HR 87 | Temp 97.7°F | Resp 20 | Ht 71.0 in | Wt 186.0 lb

## 2019-07-07 DIAGNOSIS — R0789 Other chest pain: Secondary | ICD-10-CM | POA: Diagnosis not present

## 2019-07-07 DIAGNOSIS — Z951 Presence of aortocoronary bypass graft: Secondary | ICD-10-CM

## 2019-07-07 DIAGNOSIS — I2510 Atherosclerotic heart disease of native coronary artery without angina pectoris: Secondary | ICD-10-CM

## 2019-07-07 NOTE — Progress Notes (Signed)
     HPI: Patient returns for routine postoperative follow-up having undergone removal of all sternal wires after CABG x4 on 03/01/2019.  He had significant continued postoperative pain in the chest wall. Since the procedure he said that his pain completely resolved and he feels well.  His wife is trying to get him to do cardiac rehab.   Current Outpatient Medications  Medication Sig Dispense Refill  . acetaminophen (TYLENOL) 500 MG tablet Take 1,000 mg by mouth every 6 (six) hours as needed for moderate pain or headache.    Marland Kitchen aspirin EC 81 MG tablet Take 81 mg by mouth daily.    . colestipol (COLESTID) 1 g tablet Take 1 tablet (1 g total) by mouth 2 (two) times daily. 60 tablet 3  . ezetimibe (ZETIA) 10 MG tablet Take 1 tablet (10 mg total) by mouth daily. 30 tablet 5  . HYDROmorphone (DILAUDID) 2 MG tablet Take 1 tablet (2 mg total) by mouth every 6 (six) hours as needed for severe pain. 60 tablet 0  . metoprolol tartrate (LOPRESSOR) 25 MG tablet Take 0.5 tablets (12.5 mg total) by mouth 2 (two) times daily. 90 tablet 3  . tamsulosin (FLOMAX) 0.4 MG CAPS capsule Take 2 capsules (0.8 mg total) by mouth daily after supper. For prostate (Patient taking differently: Take 0.4 mg by mouth 2 (two) times daily. ) 180 capsule 2   No current facility-administered medications for this visit.     Physical Exam: BP 102/67   Pulse 87   Temp 97.7 F (36.5 C) (Skin)   Resp 20   Ht 5\' 11"  (1.803 m)   Wt 186 lb (84.4 kg)   SpO2 91% Comment: RA  BMI 25.94 kg/m  Looks well. Cardiac exam shows a regular rate and rhythm with normal heart sounds. The small incisions along the sternotomy scar are healing well.  The sternum is stable.   Impression:  His chest wall pain has completely resolved after removing his sternal wires.  I told him he can return to normal activity.  I encouraged him to attend outpatient cardiac rehab.  Plan:  We will continue to follow-up with Dr. Tamala Julian and Dr. Buelah Manis.  He  will return to see me if he has any problems with his incisions.   [name], MD [facility] [phone]

## 2019-07-23 ENCOUNTER — Telehealth: Payer: Self-pay | Admitting: *Deleted

## 2019-07-23 NOTE — Telephone Encounter (Signed)
Faxed to [facility]

## 2019-07-23 NOTE — Telephone Encounter (Signed)
Need note clearing him to be able to volunteer for [facility] without restrictions. Per wife, patient need this by next Wednesday. Please fax to [employer]/gallery exhibitions officer at hospice) once completed

## 2019-07-23 NOTE — Telephone Encounter (Signed)
    Yes, he is cleared to volunteer without restrictions. He was also released by Dr. Cyndia Bent last month and informed to return to normal activities. Can provide note stating such as I do not see a fax number listed.  Signed, Erma Heritage, PA-C 07/23/2019, 4:17 PM Pager: [phone]

## 2019-08-11 ENCOUNTER — Encounter (HOSPITAL_COMMUNITY)
Admission: RE | Admit: 2019-08-11 | Discharge: 2019-08-11 | Disposition: A | Discharge status: Home / Self Care | Payer: Medicare Other | Source: Ambulatory Visit | Attending: Cardiovascular Disease | Admitting: Cardiovascular Disease

## 2019-08-11 ENCOUNTER — Other Ambulatory Visit: Payer: Self-pay

## 2019-08-11 ENCOUNTER — Encounter (HOSPITAL_COMMUNITY): Payer: Self-pay

## 2019-08-11 VITALS — BP 110/78 | HR 60 | Temp 95.5°F | Ht 71.0 in | Wt 186.7 lb

## 2019-08-11 DIAGNOSIS — Z951 Presence of aortocoronary bypass graft: Secondary | ICD-10-CM | POA: Diagnosis not present

## 2019-08-11 NOTE — Progress Notes (Signed)
Daily Session Note  Patient Details  Name: Benjamin [name] MRN: [id number] Date of Birth: 04-Jun-1941 Referring Provider:     [facility] from 08/11/2019 in [place]  Referring Provider  Bronson Ing      Encounter Date: 08/11/2019  Check In: Session Check In - 08/11/19 1014      Check-In   Supervising physician immediately available to respond to emergencies  See telemetry face sheet for immediately available MD    [facility]    Staff Present  Russella Dar, MS, EP, [facility], Exercise Physiologist;Debra Wynetta Emery, RN, BSN;Other    Virtual Visit  No    Medication changes reported      No    Fall or balance concerns reported     No    Tobacco Cessation  No Change   Quit 02/14/2014   Warm-up and Cool-down  Performed as group-led instruction    Resistance Training Performed  Yes    VAD Patient?  No    PAD/SET Patient?  No      Pain Assessment   Currently in Pain?  No/denies    Pain Score  0-No pain    Multiple Pain Sites  No       Capillary Blood Glucose: No results found for this or any previous visit (from the past 24 hour(s)).    Social History   Tobacco Use  Smoking Status Former Smoker  . Packs/day: 0.50  . Years: 50.00  . Pack years: 25.00  . Types: Cigarettes  . Quit date: 02/14/2014  . Years since quitting: 5.4  Smokeless Tobacco Never Used  Tobacco Comment   Quit x 1 year    Goals Met:  Independence with exercise equipment Exercise tolerated well Personal goals reviewed No report of cardiac concerns or symptoms Strength training completed today  Goals Unmet:  Not Applicable  Comments: Check out: 10:30   Dr. Kate Sable is Medical Director for [facility] and [facility].

## 2019-08-11 NOTE — Progress Notes (Signed)
Cardiac/Pulmonary Rehab Medication Review by a Pharmacist  Does the patient  feel that his/her medications are working for him/her?  yes  Has the patient been experiencing any side effects to the medications prescribed?  no  Does the patient measure his/her own blood pressure or blood glucose at home?  no   Does the patient have any problems obtaining medications due to transportation or finances?   no  Understanding of regimen: fair Understanding of indications: good Potential of compliance: good  Questions asked to Determine Patient Understanding of Medication Regimen:  1. What is the name of the medication?  2. What is the medication used for?  3. When should it be taken?  4. How much should be taken?  5. How will you take it?  6. What side effects should you report?  Understanding Defined as: Excellent: All questions above are correct Good: Questions 1-4 are correct Fair: Questions 1-2 are correct  Poor: 1 or none of the above questions are correct   Pharmacist comments: [name] presents today for cardiac rehab post recent CABG. We reviewed and update his medication list. He has stopped his colestid, which he says he informed his MD. He is taking fish oil 1000mg  twice daily along with zetia. He is unable to tolerate statins. He is compliant with his medications and tolerating without any problems. He does not monitor his BP but does have one at home. I suggested he check it daily and if he has any lightheadedness or not feeling his normal self. No other issues identified. Continue current regimen.  Thanks for the opportunity to participate in the care of this patient,  [name], BS [place], [place] Clinical Pharmacist Pager [phone] 08/11/2019 8:54 AM

## 2019-08-11 NOTE — Progress Notes (Signed)
Cardiac Individual Treatment Plan  Patient Details  Name: Benjamin [name] MRN: [id number] Date of Birth: May 23, 1941 Referring Provider:     [place] from 08/11/2019 in [place]  Referring Provider  Bronson Ing      Initial Encounter Date:    CARDIAC REHAB PHASE II ORIENTATION from 08/11/2019 in [place]  Date  08/11/19      Visit Diagnosis: S/P CABG x 3  Patient's Home Medications on Admission:  Current Outpatient Medications:  .  albuterol (ACCUNEB) 0.63 MG/3ML nebulizer solution, Take 1 ampule by nebulization every 6 (six) hours as needed for wheezing., Disp: , Rfl:  .  loperamide (IMODIUM A-D) 2 MG tablet, Take 2 mg by mouth 4 (four) times daily as needed for diarrhea or loose stools., Disp: , Rfl:  .  Omega-3 Fatty Acids (FISH OIL) 1000 MG CAPS, Take 1 capsule by mouth 2 (two) times daily., Disp: , Rfl:  .  promethazine (PHENERGAN) 12.5 MG tablet, Take 12.5 mg by mouth every 6 (six) hours as needed for nausea or vomiting., Disp: , Rfl:  .  RABEprazole (ACIPHEX) 20 MG tablet, Take 20 mg by mouth daily before breakfast., Disp: , Rfl:  .  acetaminophen (TYLENOL) 500 MG tablet, Take 1,000 mg by mouth every 6 (six) hours as needed for moderate pain or headache., Disp: , Rfl:  .  aspirin EC 81 MG tablet, Take 81 mg by mouth daily., Disp: , Rfl:  .  colestipol (COLESTID) 1 g tablet, Take 1 tablet (1 g total) by mouth 2 (two) times daily. (Patient not taking: Reported on 08/11/2019), Disp: 60 tablet, Rfl: 3 .  ezetimibe (ZETIA) 10 MG tablet, Take 1 tablet (10 mg total) by mouth daily., Disp: 30 tablet, Rfl: 5 .  HYDROmorphone (DILAUDID) 2 MG tablet, Take 1 tablet (2 mg total) by mouth every 6 (six) hours as needed for severe pain., Disp: 60 tablet, Rfl: 0 .  metoprolol tartrate (LOPRESSOR) 25 MG tablet, Take 0.5 tablets (12.5 mg total) by mouth 2 (two) times daily., Disp: 90 tablet, Rfl: 3 .  tamsulosin (FLOMAX) 0.4 MG  CAPS capsule, Take 2 capsules (0.8 mg total) by mouth daily after supper. For prostate (Patient taking differently: Take 0.4 mg by mouth 2 (two) times daily. ), Disp: 180 capsule, Rfl: 2  Past Medical History: Past Medical History:  Diagnosis Date  . Abnormal EKG    History per report of ST elevations x 20 years, no cardiac history-anomale with aorta-sts "it always looks like i am having a heart atttack on my EKG  . Arthritis    R wrist, R neck, L wrist, multiple joints- OA- CBD oil treats this problem    . Cancer [facility])    pathology is pending, plan for freezing area - L thigh- 03/17/2019  . COPD (chronic obstructive pulmonary disease) ([place])    patient denies  . Coronary artery disease    a. s/p CABG in 02/2019 with LIMA-LAD, SVG-D1, SVG-OM and SVG-RCA  . Dyspnea   . Enlarged prostate   . GERD (gastroesophageal reflux disease)   . Headache(784.0)    last one 5 yrs ago  . Sternal pain   . Vision problems    Blind x 20 years, regained site 2000, ? optic nerve injury    Tobacco Use: Social History   Tobacco Use  Smoking Status Former Smoker  . Packs/day: 0.50  . Years: 50.00  . Pack years: 25.00  . Types: Cigarettes  .  Quit date: 02/14/2014  . Years since quitting: 5.4  Smokeless Tobacco Never Used  Tobacco Comment   Quit x 1 year    Labs: Recent Review Flowsheet Data    Labs for ITP Cardiac and Pulmonary Rehab Latest Ref Rng & Units 03/01/2019 03/01/2019 03/01/2019 03/01/2019 03/02/2019   Cholestrol <200 mg/dL - - - - -   LDLCALC mg/dL (calc) - - - - -   HDL > OR = 40 mg/dL - - - - -   Trlycerides <150 mg/dL - - - - -   Hemoglobin A1c 4.8 - 5.6 % - - - - -   PHART 7.350 - 7.450 7.357 7.356 7.370 7.371 7.415   PCO2ART 32.0 - 48.0 mmHg 41.2 42.5 38.7 36.2 36.1   HCO3 20.0 - 28.0 mmol/L 23.1 24.0 22.4 20.9 23.1   TCO2 22 - 32 mmol/L 24 25 24 22 24    ACIDBASEDEF 0.0 - 2.0 mmol/L 2.0 2.0 3.0(H) 4.0(H) 1.0   O2SAT % 99.0 95.0 94.0 90.0 94.0      Capillary Blood Glucose: Lab  Results  Component Value Date   GLUCAP 138 (H) 03/03/2019   GLUCAP 102 (H) 03/03/2019   GLUCAP 168 (H) 03/03/2019   GLUCAP 138 (H) 03/03/2019   GLUCAP 107 (H) 03/02/2019     Exercise Target Goals: Exercise Program Goal: Individual exercise prescription set using results from initial 6 min walk test and THRR while considering  patient's activity barriers and safety.   Exercise Prescription Goal: Starting with aerobic activity 30 plus minutes a day, 3 days per week for initial exercise prescription. Provide home exercise prescription and guidelines that participant acknowledges understanding prior to discharge.  Activity Barriers & Risk Stratification: Activity Barriers & Cardiac Risk Stratification - 08/11/19 1016      Activity Barriers & Cardiac Risk Stratification   Activity Barriers  Balance Concerns   Possible knee giving away at times   Cardiac Risk Stratification  High       6 Minute Walk: 6 Minute Walk    Row Name 08/11/19 0959         6 Minute Walk   Phase  Initial     Distance  1200 feet     Walk Time  6 minutes     # of Rest Breaks  0     MPH  2.27     METS  2.74     RPE  11     Perceived Dyspnea   9     Symptoms  No     Resting HR  60 bpm     Resting BP  110/78     Resting Oxygen Saturation   92 %     Exercise Oxygen Saturation  during 6 min walk  92 %     Max Ex. HR  86 bpm     Max Ex. BP  140/70     2 Minute Post BP  110/60        Oxygen Initial Assessment:   Oxygen Re-Evaluation:   Oxygen Discharge (Final Oxygen Re-Evaluation):   Initial Exercise Prescription: Initial Exercise Prescription - 08/11/19 1000      Date of Initial Exercise RX and Referring Provider   Date  08/11/19    Referring Provider  [facility]    Expected Discharge Date  10/11/19      Treadmill   MPH  1.5    Grade  0    Minutes  17    METs  2.14  T5 Nustep   Level  1    SPM  91    Minutes  22    METs  1.7      Prescription Details   Frequency (times  per week)  3    Duration  Progress to 30 minutes of continuous aerobic without signs/symptoms of physical distress      Intensity   THRR 40-80% of Max Heartrate  93-110-126    Ratings of Perceived Exertion  11-13    Perceived Dyspnea  0-4      Progression   Progression  Continue to progress workloads to maintain intensity without signs/symptoms of physical distress.      Resistance Training   Training Prescription  Yes    Weight  1    Reps  10-15       Perform Capillary Blood Glucose checks as needed.  Exercise Prescription Changes:   Exercise Comments:   Exercise Goals and Review:  Exercise Goals    Row Name 08/11/19 1021             Exercise Goals   Increase Physical Activity  Yes       Expected Outcomes  Short Term: Attend rehab on a regular basis to increase amount of physical activity.;Long Term: Exercising regularly at least 3-5 days a week.       Increase Strength and Stamina  Yes       Intervention  Provide advice, education, support and counseling about physical activity/exercise needs.;Develop an individualized exercise prescription for aerobic and resistive training based on initial evaluation findings, risk stratification, comorbidities and participant's personal goals.       Expected Outcomes  Short Term: Increase workloads from initial exercise prescription for resistance, speed, and METs.;Long Term: Improve cardiorespiratory fitness, muscular endurance and strength as measured by increased METs and functional capacity (6MWT)       Able to understand and use rate of perceived exertion (RPE) scale  Yes       Intervention  Provide education and explanation on how to use RPE scale       Expected Outcomes  Short Term: Able to use RPE daily in rehab to express subjective intensity level;Long Term:  Able to use RPE to guide intensity level when exercising independently       Knowledge and understanding of Target Heart Rate Range (THRR)  Yes       Expected Outcomes   Short Term: Able to state/look up THRR;Long Term: Able to use THRR to govern intensity when exercising independently       Able to check pulse independently  Yes       Intervention  Provide education and demonstration on how to check pulse in carotid and radial arteries.       Expected Outcomes  Short Term: Able to explain why pulse checking is important during independent exercise;Long Term: Able to check pulse independently and accurately       Understanding of Exercise Prescription  Yes       Intervention  Provide education, explanation, and written materials on patient's individual exercise prescription       Expected Outcomes  Short Term: Able to explain program exercise prescription;Long Term: Able to explain home exercise prescription to exercise independently          Exercise Goals Re-Evaluation :    Discharge Exercise Prescription (Final Exercise Prescription Changes):   Nutrition:  Target Goals: Understanding of nutrition guidelines, daily intake of sodium 1500mg , cholesterol 200mg , calories 30% from  fat and 7% or less from saturated fats, daily to have 5 or more servings of fruits and vegetables.  Biometrics: Pre Biometrics - 08/11/19 1025      Pre Biometrics   Height  5\' 11"  (1.803 m)    Weight  186 lb 11.2 oz (84.7 kg)    Waist Circumference  37.5 inches    Hip Circumference  39.5 inches    Waist to Hip Ratio  0.95 %    BMI (Calculated)  26.05    Triceps Skinfold  10 mm    % Body Fat  24.1 %    Grip Strength  28.6 kg    Flexibility  8.8 in    Single Leg Stand  4.87 seconds        Nutrition Therapy Plan and Nutrition Goals: Nutrition Therapy & Goals - 08/11/19 1114      Personal Nutrition Goals   Personal Goal #2  Patient does not do his cooking. He eats what his wife prepares and they are eating some heart healthy meals. Handout given to help make heart healthy choices.    Additional Goals?  No      Intervention Plan   Intervention  Nutrition handout(s)  given to patient.       Nutrition Assessments: Nutrition Assessments - 08/11/19 1115      MEDFICTS Scores   Pre Score  67       Nutrition Goals Re-Evaluation:   Nutrition Goals Discharge (Final Nutrition Goals Re-Evaluation):   Psychosocial: Target Goals: Acknowledge presence or absence of significant depression and/or stress, maximize coping skills, provide positive support system. Participant is able to verbalize types and ability to use techniques and skills needed for reducing stress and depression.  Initial Review & Psychosocial Screening: Initial Psych Review & Screening - 08/11/19 1110      Initial Review   Current issues with  None Identified      Family Dynamics   Good Support System?  Yes      Barriers   Psychosocial barriers to participate in program  There are no identifiable barriers or psychosocial needs.      Screening Interventions   Interventions  Encouraged to exercise    Expected Outcomes  Short Term goal: Identification and review with participant of any Quality of Life or Depression concerns found by scoring the questionnaire.;Long Term goal: The participant improves quality of Life and PHQ9 Scores as seen by post scores and/or verbalization of changes       Quality of Life Scores: Quality of Life - 08/11/19 1111      Quality of Life   Select  Quality of Life      Quality of Life Scores   Health/Function Pre  19.14 %    Socioeconomic Pre  24.64 %    Psych/Spiritual Pre  23.07 %    Family Pre  25.5 %    GLOBAL Pre  22 %      Scores of 19 and below usually indicate a poorer quality of life in these areas.  A difference of  2-3 points is a clinically meaningful difference.  A difference of 2-3 points in the total score of the Quality of Life Index has been associated with significant improvement in overall quality of life, self-image, physical symptoms, and general health in studies assessing change in quality of life.  PHQ-9: Recent Review  Flowsheet Data    Depression screen [facility] 2/9 08/11/2019 04/21/2019 11/06/2018 10/19/2018 09/22/2018   Decreased Interest 1 2  0 0 0   Down, Depressed, Hopeless 0 1 0 0 0   PHQ - 2 Score 1 3 0 0 0   Altered sleeping 3 3 - - -   Tired, decreased energy 0 2 - - -   Change in appetite 1 0 - - -   Feeling bad or failure about yourself  0 0 - - -   Trouble concentrating 0 0 - - -   Moving slowly or fidgety/restless 0 0 - - -   Suicidal thoughts 0 0 - - -   PHQ-9 Score 5 8 - - -   Difficult doing work/chores Not difficult at all Not difficult at all - - -     Interpretation of Total Score  Total Score Depression Severity:  1-4 = Minimal depression, 5-9 = Mild depression, 10-14 = Moderate depression, 15-19 = Moderately severe depression, 20-27 = Severe depression   Psychosocial Evaluation and Intervention: Psychosocial Evaluation - 08/11/19 1111      Psychosocial Evaluation & Interventions   Interventions  Encouraged to exercise with the program and follow exercise prescription    Continue Psychosocial Services   No Follow up required       Psychosocial Re-Evaluation:   Psychosocial Discharge (Final Psychosocial Re-Evaluation):   Vocational Rehabilitation: Provide vocational rehab assistance to qualifying candidates.   Vocational Rehab Evaluation & Intervention: Vocational Rehab - 08/11/19 1117      Initial Vocational Rehab Evaluation & Intervention   Assessment shows need for Vocational Rehabilitation  No       Education: Education Goals: Education classes will be provided on a weekly basis, covering required topics. Participant will state understanding/return demonstration of topics presented.  Learning Barriers/Preferences: Learning Barriers/Preferences - 08/11/19 1115      Learning Barriers/Preferences   Learning Barriers  None    Learning Preferences  Audio;Computer/Internet;Group Instruction;Individual Instruction;Pictoral;Skilled Demonstration;Verbal  Instruction;Video;Written Material       Education Topics: Hypertension, Hypertension Reduction -Define heart disease and high blood pressure. Discus how high blood pressure affects the body and ways to reduce high blood pressure.   Exercise and Your Heart -Discuss why it is important to exercise, the FITT principles of exercise, normal and abnormal responses to exercise, and how to exercise safely.   Angina -Discuss definition of angina, causes of angina, treatment of angina, and how to decrease risk of having angina.   Cardiac Medications -Review what the following cardiac medications are used for, how they affect the body, and side effects that may occur when taking the medications.  Medications include Aspirin, Beta blockers, calcium channel blockers, ACE Inhibitors, angiotensin receptor blockers, diuretics, digoxin, and antihyperlipidemics.   Congestive Heart Failure -Discuss the definition of CHF, how to live with CHF, the signs and symptoms of CHF, and how keep track of weight and sodium intake.   Heart Disease and Intimacy -Discus the effect sexual activity has on the heart, how changes occur during intimacy as we age, and safety during sexual activity.   Smoking Cessation / COPD -Discuss different methods to quit smoking, the health benefits of quitting smoking, and the definition of COPD.   Nutrition I: Fats -Discuss the types of cholesterol, what cholesterol does to the heart, and how cholesterol levels can be controlled.   Nutrition II: Labels -Discuss the different components of food labels and how to read food label   Heart Parts/Heart Disease and PAD -Discuss the anatomy of the heart, the pathway of blood circulation through the heart, and these are affected  by heart disease.   Stress I: Signs and Symptoms -Discuss the causes of stress, how stress may lead to anxiety and depression, and ways to limit stress.   Stress II: Relaxation -Discuss different  types of relaxation techniques to limit stress.   Warning Signs of Stroke / TIA -Discuss definition of a stroke, what the signs and symptoms are of a stroke, and how to identify when someone is having stroke.   Knowledge Questionnaire Score: Knowledge Questionnaire Score - 08/11/19 1117      Knowledge Questionnaire Score   Pre Score  18/24       Core Components/Risk Factors/Patient Goals at Admission: Personal Goals and Risk Factors at Admission - 08/11/19 1117      Core Components/Risk Factors/Patient Goals on Admission    Weight Management  Weight Maintenance    Personal Goal Other  Yes    Personal Goal  Walk 1 mile without falling down or SOB, Increase stamina, be able to work normally w/o SOB    Intervention  Attend CR 3xweek and supplement with at home exercise plan 2 xweek    Expected Outcomes  Reach goals.       Core Components/Risk Factors/Patient Goals Review:    Core Components/Risk Factors/Patient Goals at Discharge (Final Review):    ITP Comments: ITP Comments    Row Name 08/11/19 1014           ITP Comments  Patient is getting started after having by-pass surgery with complications. He is not all that excited about exercising but does see the importance of the program and how it will benefit him.          Comments: Patient arrived for 1st visit/orientation/education at 0800. Patient was referred to CR by Dr. Bronson Ing due to CABGx3 (Z95.1).  During orientation advised patient on arrival and appointment times what to wear, what to do before, during and after exercise. Reviewed attendance and class policy. Talked about inclement weather and class consultation policy. Pt is scheduled to return Cardiac Rehab on 08/16/19 at 0815. Pt was advised to come to class 15 minutes before class starts. Patient was also given instructions on meeting with the dietician and attending the Family Structure classes. Discussed RPE/Dpysnea scales. Discussed initial THR and how to  find their radial and/or carotid pulse. Discussed the initial exercise prescription and how this effects their progress. Pt is eager to get started. Patient participated in warm up stretches followed by light weights and resistance bands. Patient was able to complete 6 minute walk test. Patient did not c/o pain during test. Patient was measured for the equipment. Discussed equipment safety with patient. Took patient pre-anthropometric measurements. Patient finished visit at 1030.

## 2019-08-16 ENCOUNTER — Encounter (HOSPITAL_COMMUNITY)
Admission: RE | Admit: 2019-08-16 | Discharge: 2019-08-16 | Disposition: A | Discharge status: Home / Self Care | Payer: Medicare Other | Source: Ambulatory Visit | Attending: Cardiovascular Disease | Admitting: Cardiovascular Disease

## 2019-08-16 ENCOUNTER — Other Ambulatory Visit: Payer: Self-pay

## 2019-08-16 DIAGNOSIS — Z951 Presence of aortocoronary bypass graft: Secondary | ICD-10-CM | POA: Diagnosis not present

## 2019-08-16 NOTE — Progress Notes (Signed)
Daily Session Note  Patient Details  Name: Loraine Freid MRN: [id number] Date of Birth: 06/07/1941 Referring Provider:     [place] from 08/11/2019 in [place]  Referring Provider  Bronson Ing      Encounter Date: 08/16/2019  Check In:   Capillary Blood Glucose: No results found for this or any previous visit (from the past 24 hour(s)).    Social History   Tobacco Use  Smoking Status Former Smoker  . Packs/day: 0.50  . Years: 50.00  . Pack years: 25.00  . Types: Cigarettes  . Quit date: 02/14/2014  . Years since quitting: 5.5  Smokeless Tobacco Never Used  Tobacco Comment   Quit x 1 year    Goals Met:  Independence with exercise equipment Exercise tolerated well Personal goals reviewed No report of cardiac concerns or symptoms Strength training completed today  Goals Unmet:  Not Applicable  Comments: Check out at 9:15am.   Dr. Kate Sable is Medical Director for [facility] and Pulmonary Rehab.

## 2019-08-17 ENCOUNTER — Other Ambulatory Visit: Payer: Medicare Other

## 2019-08-17 DIAGNOSIS — E7439 Other disorders of intestinal carbohydrate absorption: Secondary | ICD-10-CM | POA: Diagnosis not present

## 2019-08-17 DIAGNOSIS — Z13220 Encounter for screening for lipoid disorders: Secondary | ICD-10-CM | POA: Diagnosis not present

## 2019-08-17 DIAGNOSIS — E119 Type 2 diabetes mellitus without complications: Secondary | ICD-10-CM

## 2019-08-17 DIAGNOSIS — Z136 Encounter for screening for cardiovascular disorders: Secondary | ICD-10-CM | POA: Diagnosis not present

## 2019-08-18 ENCOUNTER — Encounter (HOSPITAL_COMMUNITY)
Admission: RE | Admit: 2019-08-18 | Discharge: 2019-08-18 | Disposition: A | Discharge status: Home / Self Care | Payer: Medicare Other | Source: Ambulatory Visit | Attending: Cardiovascular Disease | Admitting: Cardiovascular Disease

## 2019-08-18 ENCOUNTER — Other Ambulatory Visit: Payer: Self-pay

## 2019-08-18 DIAGNOSIS — Z951 Presence of aortocoronary bypass graft: Secondary | ICD-10-CM | POA: Diagnosis present

## 2019-08-18 LAB — CBC WITH DIFFERENTIAL/PLATELET
Absolute Monocytes: 640 cells/uL (ref 200–950)
Basophils Absolute: 70 cells/uL (ref 0–200)
Basophils Relative: 1.1 %
Eosinophils Absolute: 499 cells/uL (ref 15–500)
Eosinophils Relative: 7.8 %
HCT: 46 % (ref 38.5–50.0)
Hemoglobin: 15.4 g/dL (ref 13.2–17.1)
Lymphs Abs: 1722 cells/uL (ref 850–3900)
MCH: 30.7 pg (ref 27.0–33.0)
MCHC: 33.5 g/dL (ref 32.0–36.0)
MCV: 91.8 fL (ref 80.0–100.0)
MPV: 10.1 fL (ref 7.5–12.5)
Monocytes Relative: 10 %
Neutro Abs: 3469 cells/uL (ref 1500–7800)
Neutrophils Relative %: 54.2 %
Platelets: 278 10*3/uL (ref 140–400)
RBC: 5.01 10*6/uL (ref 4.20–5.80)
RDW: 13.6 % (ref 11.0–15.0)
Total Lymphocyte: 26.9 %
WBC: 6.4 10*3/uL (ref 3.8–10.8)

## 2019-08-18 LAB — COMPLETE METABOLIC PANEL WITH GFR
AG Ratio: 1.6 (calc) (ref 1.0–2.5)
ALT: 19 U/L (ref 9–46)
AST: 20 U/L (ref 10–35)
Albumin: 3.9 g/dL (ref 3.6–5.1)
Alkaline phosphatase (APISO): 56 U/L (ref 35–144)
BUN/Creatinine Ratio: 15 (calc) (ref 6–22)
BUN: 18 mg/dL (ref 7–25)
CO2: 23 mmol/L (ref 20–32)
Calcium: 9.3 mg/dL (ref 8.6–10.3)
Chloride: 107 mmol/L (ref 98–110)
Creat: 1.21 mg/dL — ABNORMAL HIGH (ref 0.70–1.18)
GFR, Est African American: 67 mL/min/{1.73_m2} (ref >=60)
GFR, Est Non African American: 57 mL/min/{1.73_m2} — ABNORMAL LOW (ref >=60)
Globulin: 2.4 g/dL (calc) (ref 1.9–3.7)
Glucose, Bld: 100 mg/dL — ABNORMAL HIGH (ref 65–99)
Potassium: 4.4 mmol/L (ref 3.5–5.3)
Sodium: 141 mmol/L (ref 135–146)
Total Bilirubin: 1.3 mg/dL — ABNORMAL HIGH (ref 0.2–1.2)
Total Protein: 6.3 g/dL (ref 6.1–8.1)

## 2019-08-18 LAB — LIPID PANEL
Cholesterol: 144 mg/dL (ref <200)
HDL: 33 mg/dL — ABNORMAL LOW (ref >=40)
LDL Cholesterol (Calc): 92 mg/dL (calc)
Non-HDL Cholesterol (Calc): 111 mg/dL (calc) (ref <130)
Total CHOL/HDL Ratio: 4.4 (calc) (ref <5.0)
Triglycerides: 101 mg/dL (ref <150)

## 2019-08-18 LAB — HEMOGLOBIN A1C
Hgb A1c MFr Bld: 6 % of total Hgb — ABNORMAL HIGH (ref <5.7)
Mean Plasma Glucose: 126 (calc)
eAG (mmol/L): 7 (calc)

## 2019-08-18 NOTE — Progress Notes (Signed)
Daily Session Note  Patient Details  Name: Benjamin [name] MRN: [id number] Date of Birth: 11/23/1940 Referring Provider:     [place] from 08/11/2019 in [place]  Referring Provider  Bronson Ing      Encounter Date: 08/18/2019  Check In: Session Check In - 08/18/19 0826      Check-In   Supervising physician immediately available to respond to emergencies  See telemetry face sheet for immediately available MD    Location  [facility]    Staff Present  Russella Dar, MS, EP, [facility], Exercise Physiologist;Debra Wynetta Emery, RN, BSN    Virtual Visit  No    Medication changes reported      No    Fall or balance concerns reported     No    Tobacco Cessation  No Change    Warm-up and Cool-down  Performed as group-led instruction    Resistance Training Performed  Yes    VAD Patient?  No    PAD/SET Patient?  No      Pain Assessment   Currently in Pain?  No/denies    Pain Score  0-No pain    Multiple Pain Sites  No       Capillary Blood Glucose: Results for orders placed or performed in visit on 08/17/19 (from the past 24 hour(s))  Lipid panel     Status: Abnormal   Collection Time: 08/17/19 10:54 AM  Result Value Ref Range   Cholesterol 144 <200 mg/dL   HDL 33 (L) > OR = 40 mg/dL   Triglycerides 101 <150 mg/dL   LDL Cholesterol (Calc) 92 mg/dL (calc)   Total CHOL/HDL Ratio 4.4 <5.0 (calc)   Non-HDL Cholesterol (Calc) 111 <130 mg/dL (calc)  Hemoglobin A1c     Status: Abnormal   Collection Time: 08/17/19 10:54 AM  Result Value Ref Range   Hgb A1c MFr Bld 6.0 (H) <5.7 % of total Hgb   Mean Plasma Glucose 126 (calc)   eAG (mmol/L) 7.0 (calc)  COMPLETE METABOLIC PANEL WITH GFR     Status: Abnormal   Collection Time: 08/17/19 10:54 AM  Result Value Ref Range   Glucose, Bld 100 (H) 65 - 99 mg/dL   BUN 18 7 - 25 mg/dL   Creat 1.21 (H) 0.70 - 1.18 mg/dL   GFR, Est Non African American 57 (L) > OR = 60 mL/min/1.68m   GFR, Est  African American 67 > OR = 60 mL/min/1.750m  BUN/Creatinine Ratio 15 6 - 22 (calc)   Sodium 141 135 - 146 mmol/L   Potassium 4.4 3.5 - 5.3 mmol/L   Chloride 107 98 - 110 mmol/L   CO2 23 20 - 32 mmol/L   Calcium 9.3 8.6 - 10.3 mg/dL   Total Protein 6.3 6.1 - 8.1 g/dL   Albumin 3.9 3.6 - 5.1 g/dL   Globulin 2.4 1.9 - 3.7 g/dL (calc)   AG Ratio 1.6 1.0 - 2.5 (calc)   Total Bilirubin 1.3 (H) 0.2 - 1.2 mg/dL   Alkaline phosphatase (APISO) 56 35 - 144 U/L   AST 20 10 - 35 U/L   ALT 19 9 - 46 U/L  CBC with Differential/Platelet     Status: None   Collection Time: 08/17/19 10:54 AM  Result Value Ref Range   WBC 6.4 3.8 - 10.8 Thousand/uL   RBC 5.01 4.20 - 5.80 Million/uL   Hemoglobin 15.4 13.2 - 17.1 g/dL   HCT 46.0 38.5 - 50.0 %  MCV 91.8 80.0 - 100.0 fL   MCH 30.7 27.0 - 33.0 pg   MCHC 33.5 32.0 - 36.0 g/dL   RDW 13.6 11.0 - 15.0 %   Platelets 278 140 - 400 Thousand/uL   MPV 10.1 7.5 - 12.5 fL   Neutro Abs 3,469 1,500 - 7,800 cells/uL   Lymphs Abs 1,722 850 - 3,900 cells/uL   Absolute Monocytes 640 200 - 950 cells/uL   Eosinophils Absolute 499 15 - 500 cells/uL   Basophils Absolute 70 0 - 200 cells/uL   Neutrophils Relative % 54.2 %   Total Lymphocyte 26.9 %   Monocytes Relative 10.0 %   Eosinophils Relative 7.8 %   Basophils Relative 1.1 %      Social History   Tobacco Use  Smoking Status Former Smoker  . Packs/day: 0.50  . Years: 50.00  . Pack years: 25.00  . Types: Cigarettes  . Quit date: 02/14/2014  . Years since quitting: 5.5  Smokeless Tobacco Never Used  Tobacco Comment   Quit x 1 year    Goals Met:  Independence with exercise equipment Exercise tolerated well Personal goals reviewed No report of cardiac concerns or symptoms Strength training completed today  Goals Unmet:  Not Applicable  Comments: Check out: 915    Dr. Kate Sable is Medical Director for [facility] and Pulmonary Rehab.

## 2019-08-20 ENCOUNTER — Other Ambulatory Visit: Payer: Self-pay

## 2019-08-20 ENCOUNTER — Ambulatory Visit (INDEPENDENT_AMBULATORY_CARE_PROVIDER_SITE_OTHER): Payer: Medicare Other | Admitting: Family Medicine

## 2019-08-20 ENCOUNTER — Encounter (HOSPITAL_COMMUNITY)
Admission: RE | Admit: 2019-08-20 | Discharge: 2019-08-20 | Disposition: A | Discharge status: Home / Self Care | Payer: Medicare Other | Source: Ambulatory Visit | Attending: Cardiovascular Disease | Admitting: Cardiovascular Disease

## 2019-08-20 ENCOUNTER — Encounter: Payer: Self-pay | Admitting: Family Medicine

## 2019-08-20 ENCOUNTER — Ambulatory Visit: Payer: Medicare Other | Admitting: Family Medicine

## 2019-08-20 VITALS — BP 96/62 | HR 88 | Temp 98.3°F | Resp 14 | Ht 71.0 in | Wt 188.0 lb

## 2019-08-20 DIAGNOSIS — K219 Gastro-esophageal reflux disease without esophagitis: Secondary | ICD-10-CM | POA: Diagnosis not present

## 2019-08-20 DIAGNOSIS — I700 Atherosclerosis of aorta: Secondary | ICD-10-CM

## 2019-08-20 DIAGNOSIS — Z951 Presence of aortocoronary bypass graft: Secondary | ICD-10-CM | POA: Diagnosis not present

## 2019-08-20 DIAGNOSIS — I2510 Atherosclerotic heart disease of native coronary artery without angina pectoris: Secondary | ICD-10-CM

## 2019-08-20 DIAGNOSIS — E119 Type 2 diabetes mellitus without complications: Secondary | ICD-10-CM

## 2019-08-20 NOTE — Assessment & Plan Note (Signed)
Diet controlled, improved A1C

## 2019-08-20 NOTE — Assessment & Plan Note (Signed)
Continue aciphex.  

## 2019-08-20 NOTE — Progress Notes (Signed)
   Subjective:    Patient ID: Benjamin [name], male    DOB: 12/03/1940, 78 y.o.   MRN: [id number]  Patient presents for Follow-up (has had labs)  Pt here to f/u chronic medical problems. 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 that was causing pain, Recent lipids reviewed, LDL  92 , on zetia, does not tolerate statin drugs   currently in cardiac rehab  His pain resolved, no longer needing dilaudid   DM- diet controlled - A1C improved to 6%    GERD/ chronic GI pain- on aciphex, continues to have issues, but declines f/u at this time  Flu shot UTD   Labs reviewed at bedside   Review Of Systems:  GEN- denies fatigue, fever, weight loss,weakness, recent illness HEENT- denies eye drainage, change in vision, nasal discharge, CVS- denies chest pain, palpitations RESP- denies SOB, cough, wheeze ABD- denies N/V, change in stools, abd pain GU- denies dysuria, hematuria, dribbling, incontinence MSK- denies joint pain, muscle aches, injury Neuro- denies headache, dizziness, syncope, seizure activity       Objective:    BP 96/62   Pulse 88   Temp 98.3 F (36.8 C) (Temporal)   Resp 14   Ht 5\' 11"  (1.803 m)   Wt 188 lb (85.3 kg)   SpO2 96%   BMI 26.22 kg/m  GEN- NAD, alert and oriented x3 HEENT- PERRL, EOMI, non injected sclera, pink conjunctiva, CVS- RRR, no murmur RESP-CTAB ABD-NABS,soft,NT,ND EXT- No edema Pulses- Radial, DP- 2+        Assessment & Plan:      Problem List Items Addressed This Visit      Unprioritized   Aortic atherosclerosis ([place]) - Primary    LDL at  92, goal would be less than 70 but does not tolerate statins, he is interested in the [place] or Praluent, will reach out to his cardiology Blood pressure is low normal, but pt asymptomatic, no changes Continue cardiac rehab      CAD (coronary artery disease)   GERD (gastroesophageal reflux disease)    Continue aciphex      Type 2 diabetes mellitus without  complications (HCC)    Diet controlled, improved A1C          Note: This dictation was prepared with Dragon dictation along with smaller phrase technology. Any transcriptional errors that result from this process are unintentional.

## 2019-08-20 NOTE — Assessment & Plan Note (Signed)
LDL at  92, goal would be less than 70 but does not tolerate statins, he is interested in the [place] or Praluent, will reach out to his cardiology Blood pressure is low normal, but pt asymptomatic, no changes Continue cardiac rehab

## 2019-08-20 NOTE — Patient Instructions (Signed)
F/U 4 months for wellness visit  

## 2019-08-20 NOTE — Progress Notes (Signed)
Daily Session Note  Patient Details  Name: Benjamin [name] MRN: [id number] Date of Birth: 08/12/1941 Referring Provider:     CARDIAC REHAB PHASE II ORIENTATION from 08/11/2019 in [facility]  Referring Provider  Koneswaran      Encounter Date: 08/20/2019  Check In: Session Check In - 08/20/19 0822      Check-In   Supervising physician immediately available to respond to emergencies  See telemetry face sheet for immediately available MD    Location  [facility]    Staff Present  Diane Coad, MS, EP, CHC, Exercise Physiologist;Debra Johnson, RN, BSN;Other    Virtual Visit  No    Medication changes reported      No    Fall or balance concerns reported     No    Tobacco Cessation  No Change    Warm-up and Cool-down  Performed as group-led instruction    Resistance Training Performed  Yes    VAD Patient?  No    PAD/SET Patient?  No      Pain Assessment   Currently in Pain?  No/denies    Pain Score  0-No pain       Capillary Blood Glucose: No results found for this or any previous visit (from the past 24 hour(s)).    Social History   Tobacco Use  Smoking Status Former Smoker  . Packs/day: 0.50  . Years: 50.00  . Pack years: 25.00  . Types: Cigarettes  . Quit date: 02/14/2014  . Years since quitting: 5.5  Smokeless Tobacco Never Used  Tobacco Comment   Quit x 1 year    Goals Met:  Independence with exercise equipment Exercise tolerated well Personal goals reviewed No report of cardiac concerns or symptoms Strength training completed today  Goals Unmet:  Not Applicable  Comments: Check out: 0915   Dr. Suresh Koneswaran is Medical Director for [facility]. 

## 2019-08-20 NOTE — Addendum Note (Signed)
Addended by: Vic Blackbird F on: 08/20/2019 02:44 PM   Modules accepted: Orders

## 2019-08-23 ENCOUNTER — Encounter (HOSPITAL_COMMUNITY)
Admission: RE | Admit: 2019-08-23 | Discharge: 2019-08-23 | Disposition: A | Discharge status: Home / Self Care | Payer: Medicare Other | Source: Ambulatory Visit | Attending: Cardiovascular Disease | Admitting: Cardiovascular Disease

## 2019-08-23 ENCOUNTER — Other Ambulatory Visit: Payer: Self-pay

## 2019-08-23 DIAGNOSIS — Z951 Presence of aortocoronary bypass graft: Secondary | ICD-10-CM | POA: Diagnosis not present

## 2019-08-23 NOTE — Progress Notes (Signed)
Daily Session Note  Patient Details  Name: Osinachi Navarrette MRN: [id number] Date of Birth: [birth year] Referring Provider:     [place] from 08/11/2019 in [facility]  Referring Provider  Bronson Ing      Encounter Date: 08/23/2019  Check In:   Capillary Blood Glucose: No results found for this or any previous visit (from the past 24 hour(s)).    Social History   Tobacco Use  Smoking Status Former Smoker  . Packs/day: 0.50  . Years: 50.00  . Pack years: 25.00  . Types: Cigarettes  . Quit date: 02/14/2014  . Years since quitting: 5.5  Smokeless Tobacco Never Used  Tobacco Comment   Quit x 1 year    Goals Met:  Independence with exercise equipment Exercise tolerated well Personal goals reviewed No report of cardiac concerns or symptoms Strength training completed today  Goals Unmet:  Not Applicable  Comments: Check out at 9:15am.   Dr. Kate Sable is Medical Director for [facility] and [facility].

## 2019-08-24 ENCOUNTER — Telehealth: Payer: Self-pay

## 2019-08-24 DIAGNOSIS — E785 Hyperlipidemia, unspecified: Secondary | ICD-10-CM

## 2019-08-24 NOTE — Telephone Encounter (Signed)
-----   Message from Herminio Commons, MD sent at 08/24/2019 11:35 AM EST ----- Regarding: RE: labs Yes please. ----- Message ----- From: Bernita Raisin, RN Sent: 08/24/2019  11:29 AM EST To: Herminio Commons, MD Subject: labs                                           Shall I refer him to lipid clinic? ----- Message ----- From: Laurine Blazer, LPN Sent: [id number]  10:38 AM EST To: Bernita Raisin, RN   ----- Message ----- From: Herminio Commons, MD Sent: 08/23/2019   8:39 AM EST To: Laurine Blazer, LPN  Please start Repatha. ----- Message ----- From: Alycia Rossetti, MD Sent: 08/20/2019   2:43 PM EST To: Herminio Commons, MD  Here are Mr. Mand[place] recent labs. I know goal is LDL around 70, he doesn't tolerate statins, on zetia. He is interested in the [place]/Praluent if you think this would be beneficial to him.    Thanks

## 2019-08-24 NOTE — Telephone Encounter (Signed)
I will ref patient to Lipid clinic

## 2019-08-25 ENCOUNTER — Encounter (HOSPITAL_COMMUNITY): Payer: Medicare Other

## 2019-08-25 NOTE — Progress Notes (Signed)
Cardiac Individual Treatment Plan  Patient Details  Name: Benjamin [name] MRN: [id number] Date of Birth: October 19, 1940 Referring Provider:     [place] from 08/11/2019 in [place]  Referring Provider  Bronson Ing      Initial Encounter Date:    CARDIAC REHAB PHASE II ORIENTATION from 08/11/2019 in [place]  Date  08/11/19      Visit Diagnosis: S/P CABG x 3  Patient's Home Medications on Admission:  Current Outpatient Medications:  .  acetaminophen (TYLENOL) 500 MG tablet, Take 1,000 mg by mouth every 6 (six) hours as needed for moderate pain or headache., Disp: , Rfl:  .  aspirin EC 81 MG tablet, Take 81 mg by mouth daily., Disp: , Rfl:  .  ezetimibe (ZETIA) 10 MG tablet, Take 1 tablet (10 mg total) by mouth daily., Disp: 30 tablet, Rfl: 5 .  loperamide (IMODIUM A-D) 2 MG tablet, Take 2 mg by mouth 4 (four) times daily as needed for diarrhea or loose stools., Disp: , Rfl:  .  metoprolol tartrate (LOPRESSOR) 25 MG tablet, Take 0.5 tablets (12.5 mg total) by mouth 2 (two) times daily., Disp: 90 tablet, Rfl: 3 .  Omega-3 Fatty Acids (FISH OIL) 1000 MG CAPS, Take 1 capsule by mouth 2 (two) times daily., Disp: , Rfl:  .  promethazine (PHENERGAN) 12.5 MG tablet, Take 12.5 mg by mouth every 6 (six) hours as needed for nausea or vomiting., Disp: , Rfl:  .  RABEprazole (ACIPHEX) 20 MG tablet, Take 20 mg by mouth daily before breakfast., Disp: , Rfl:  .  tamsulosin (FLOMAX) 0.4 MG CAPS capsule, Take 2 capsules (0.8 mg total) by mouth daily after supper. For prostate (Patient taking differently: Take 0.4 mg by mouth 2 (two) times daily. ), Disp: 180 capsule, Rfl: 2  Past Medical History: Past Medical History:  Diagnosis Date  . Abnormal EKG    History per report of ST elevations x 20 years, no cardiac history-anomale with aorta-sts "it always looks like i am having a heart atttack on my EKG  . Arthritis    R wrist, R neck, L  wrist, multiple joints- OA- CBD oil treats this problem    . Cancer [facility])    pathology is pending, plan for freezing area - L thigh- 03/17/2019  . COPD (chronic obstructive pulmonary disease) ([place])    patient denies  . Coronary artery disease    a. s/p CABG in 02/2019 with LIMA-LAD, SVG-D1, SVG-OM and SVG-RCA  . Dyspnea   . Enlarged prostate   . GERD (gastroesophageal reflux disease)   . Headache(784.0)    last one 5 yrs ago  . Sternal pain   . Vision problems    Blind x 20 years, regained site 2000, ? optic nerve injury    Tobacco Use: Social History   Tobacco Use  Smoking Status Former Smoker  . Packs/day: 0.50  . Years: 50.00  . Pack years: 25.00  . Types: Cigarettes  . Quit date: 02/14/2014  . Years since quitting: 5.5  Smokeless Tobacco Never Used  Tobacco Comment   Quit x 1 year    Labs: Recent Review Flowsheet Data    Labs for ITP Cardiac and Pulmonary Rehab Latest Ref Rng & Units 03/01/2019 03/01/2019 03/01/2019 03/02/2019 08/17/2019   Cholestrol <200 mg/dL - - - - 144   LDLCALC mg/dL (calc) - - - - 92   HDL > OR = 40 mg/dL - - - - 33(L)  Trlycerides <150 mg/dL - - - - 101   Hemoglobin A1c <5.7 % of total Hgb - - - - 6.0(H)   PHART 7.350 - 7.450 7.356 7.370 7.371 7.415 -   PCO2ART 32.0 - 48.0 mmHg 42.5 38.7 36.2 36.1 -   HCO3 20.0 - 28.0 mmol/L 24.0 22.4 20.9 23.1 -   TCO2 22 - 32 mmol/L 25 24 22 24  -   ACIDBASEDEF 0.0 - 2.0 mmol/L 2.0 3.0(H) 4.0(H) 1.0 -   O2SAT % 95.0 94.0 90.0 94.0 -      Capillary Blood Glucose: Lab Results  Component Value Date   GLUCAP 138 (H) 03/03/2019   GLUCAP 102 (H) 03/03/2019   GLUCAP 168 (H) 03/03/2019   GLUCAP 138 (H) 03/03/2019   GLUCAP 107 (H) 03/02/2019     Exercise Target Goals: Exercise Program Goal: Individual exercise prescription set using results from initial 6 min walk test and THRR while considering  patient's activity barriers and safety.   Exercise Prescription Goal: Starting with aerobic activity 30 plus  minutes a day, 3 days per week for initial exercise prescription. Provide home exercise prescription and guidelines that participant acknowledges understanding prior to discharge.  Activity Barriers & Risk Stratification: Activity Barriers & Cardiac Risk Stratification - 08/11/19 1016      Activity Barriers & Cardiac Risk Stratification   Activity Barriers  Balance Concerns   Possible knee giving away at times   Cardiac Risk Stratification  High       6 Minute Walk: 6 Minute Walk    Row Name 08/11/19 0959         6 Minute Walk   Phase  Initial     Distance  1200 feet     Walk Time  6 minutes     # of Rest Breaks  0     MPH  2.27     METS  2.74     RPE  11     Perceived Dyspnea   9     Symptoms  No     Resting HR  60 bpm     Resting BP  110/78     Resting Oxygen Saturation   92 %     Exercise Oxygen Saturation  during 6 min walk  92 %     Max Ex. HR  86 bpm     Max Ex. BP  140/70     2 Minute Post BP  110/60        Oxygen Initial Assessment:   Oxygen Re-Evaluation:   Oxygen Discharge (Final Oxygen Re-Evaluation):   Initial Exercise Prescription: Initial Exercise Prescription - 08/11/19 1000      Date of Initial Exercise RX and Referring Provider   Date  08/11/19    Referring Provider  [facility]    Expected Discharge Date  10/11/19      Treadmill   MPH  1.5    Grade  0    Minutes  17    METs  2.14      T5 Nustep   Level  1    SPM  91    Minutes  22    METs  1.7      Prescription Details   Frequency (times per week)  3    Duration  Progress to 30 minutes of continuous aerobic without signs/symptoms of physical distress      Intensity   THRR 40-80% of Max Heartrate  93-110-126    Ratings of Perceived Exertion  11-13    Perceived Dyspnea  0-4      Progression   Progression  Continue to progress workloads to maintain intensity without signs/symptoms of physical distress.      Resistance Training   Training Prescription  Yes    Weight  1     Reps  10-15       Perform Capillary Blood Glucose checks as needed.  Exercise Prescription Changes:  Exercise Prescription Changes    Row Name 08/24/19 1700             Response to Exercise   Blood Pressure (Admit)  110/72       Blood Pressure (Exercise)  108/60       Blood Pressure (Exit)  98/60       Heart Rate (Admit)  80 bpm       Heart Rate (Exercise)  101 bpm       Heart Rate (Exit)  87 bpm       Symptoms  none       Comments  doing well       Duration  Continue with 30 min of aerobic exercise without signs/symptoms of physical distress.       Intensity  THRR unchanged         Progression   Progression  Continue to progress workloads to maintain intensity without signs/symptoms of physical distress.       Average METs  2.37         Resistance Training   Training Prescription  Yes       Weight  2       Reps  10-15         Treadmill   MPH  1.8       Grade  0       Minutes  17       METs  2.37         T5 Nustep   Level  2       SPM  97       Minutes  22       METs  2         Home Exercise Plan   Plans to continue exercise at  Home (comment)       Frequency  Add 2 additional days to program exercise sessions.       Initial Home Exercises Provided  08/11/19          Exercise Comments:  Exercise Comments    Row Name 08/24/19 1721           Exercise Comments  Laquane is doing very well in the program. He has only been coming 2 weeks. In these 2 weeks he has shown that he is ready to be progressed to 2lbs weights, to have his mph increased on the TM and increase the level on the Nustep. As we progress him every 2 weeks he should be able to notice his stamina increasing which is apart of his goals.          Exercise Goals and Review:  Exercise Goals    Row Name 08/11/19 1021             Exercise Goals   Increase Physical Activity  Yes       Expected Outcomes  Short Term: Attend rehab on a regular basis to increase amount of physical  activity.;Long Term: Exercising regularly at least 3-5 days a week.  Increase Strength and Stamina  Yes       Intervention  Provide advice, education, support and counseling about physical activity/exercise needs.;Develop an individualized exercise prescription for aerobic and resistive training based on initial evaluation findings, risk stratification, comorbidities and participant's personal goals.       Expected Outcomes  Short Term: Increase workloads from initial exercise prescription for resistance, speed, and METs.;Long Term: Improve cardiorespiratory fitness, muscular endurance and strength as measured by increased METs and functional capacity (6MWT)       Able to understand and use rate of perceived exertion (RPE) scale  Yes       Intervention  Provide education and explanation on how to use RPE scale       Expected Outcomes  Short Term: Able to use RPE daily in rehab to express subjective intensity level;Long Term:  Able to use RPE to guide intensity level when exercising independently       Knowledge and understanding of Target Heart Rate Range (THRR)  Yes       Expected Outcomes  Short Term: Able to state/look up THRR;Long Term: Able to use THRR to govern intensity when exercising independently       Able to check pulse independently  Yes       Intervention  Provide education and demonstration on how to check pulse in carotid and radial arteries.       Expected Outcomes  Short Term: Able to explain why pulse checking is important during independent exercise;Long Term: Able to check pulse independently and accurately       Understanding of Exercise Prescription  Yes       Intervention  Provide education, explanation, and written materials on patient's individual exercise prescription       Expected Outcomes  Short Term: Able to explain program exercise prescription;Long Term: Able to explain home exercise prescription to exercise independently          Exercise Goals Re-Evaluation  : Exercise Goals Re-Evaluation    Row Name 08/24/19 1720             Exercise Goal Re-Evaluation   Exercise Goals Review  Increase Physical Activity;Increase Strength and Stamina Be able to walk a mile w/o falling down or becoming SOB       Comments  Marvion's main goals is to be able to walk a mile w/o falling down or geeting SOB. He aslo wants to be able to bring in the groceries w/o SOB. He would like to also see his stamina increase over time w/o SOB.       Expected Outcomes  To be able to achieve his goals before graduation           Discharge Exercise Prescription (Final Exercise Prescription Changes): Exercise Prescription Changes - 08/24/19 1700      Response to Exercise   Blood Pressure (Admit)  110/72    Blood Pressure (Exercise)  108/60    Blood Pressure (Exit)  98/60    Heart Rate (Admit)  80 bpm    Heart Rate (Exercise)  101 bpm    Heart Rate (Exit)  87 bpm    Symptoms  none    Comments  doing well    Duration  Continue with 30 min of aerobic exercise without signs/symptoms of physical distress.    Intensity  THRR unchanged      Progression   Progression  Continue to progress workloads to maintain intensity without signs/symptoms of physical distress.    Average  METs  2.37      Resistance Training   Training Prescription  Yes    Weight  2    Reps  10-15      Treadmill   MPH  1.8    Grade  0    Minutes  17    METs  2.37      T5 Nustep   Level  2    SPM  97    Minutes  22    METs  2      Home Exercise Plan   Plans to continue exercise at  Home (comment)    Frequency  Add 2 additional days to program exercise sessions.    Initial Home Exercises Provided  08/11/19       Nutrition:  Target Goals: Understanding of nutrition guidelines, daily intake of sodium 1500mg , cholesterol 200mg , calories 30% from fat and 7% or less from saturated fats, daily to have 5 or more servings of fruits and vegetables.  Biometrics: Pre Biometrics - 08/11/19 1025       Pre Biometrics   Height  5\' 11"  (1.803 m)    Weight  84.7 kg    Waist Circumference  37.5 inches    Hip Circumference  39.5 inches    Waist to Hip Ratio  0.95 %    BMI (Calculated)  26.05    Triceps Skinfold  10 mm    % Body Fat  24.1 %    Grip Strength  28.6 kg    Flexibility  8.8 in    Single Leg Stand  4.87 seconds        Nutrition Therapy Plan and Nutrition Goals: Nutrition Therapy & Goals - 08/25/19 1441      Personal Nutrition Goals   Comments  We have resumed our RD class and are currently working on scheduling patients. In the intermin, we are providing education through hand-oust. He scored a 67 on his medficts diet assessment. Will continue to monitor for progress.      Intervention Plan   Intervention  Nutrition handout(s) given to patient.    Expected Outcomes  Short Term Goal: Understand basic principles of dietary content, such as calories, fat, sodium, cholesterol and nutrients.       Nutrition Assessments: Nutrition Assessments - 08/11/19 1115      MEDFICTS Scores   Pre Score  67       Nutrition Goals Re-Evaluation:   Nutrition Goals Discharge (Final Nutrition Goals Re-Evaluation):   Psychosocial: Target Goals: Acknowledge presence or absence of significant depression and/or stress, maximize coping skills, provide positive support system. Participant is able to verbalize types and ability to use techniques and skills needed for reducing stress and depression.  Initial Review & Psychosocial Screening: Initial Psych Review & Screening - 08/11/19 1110      Initial Review   Current issues with  None Identified      Family Dynamics   Good Support System?  Yes      Barriers   Psychosocial barriers to participate in program  There are no identifiable barriers or psychosocial needs.      Screening Interventions   Interventions  Encouraged to exercise    Expected Outcomes  Short Term goal: Identification and review with participant of any Quality of  Life or Depression concerns found by scoring the questionnaire.;Long Term goal: The participant improves quality of Life and PHQ9 Scores as seen by post scores and/or verbalization of changes  Quality of Life Scores: Quality of Life - 08/11/19 1111      Quality of Life   Select  Quality of Life      Quality of Life Scores   Health/Function Pre  19.14 %    Socioeconomic Pre  24.64 %    Psych/Spiritual Pre  23.07 %    Family Pre  25.5 %    GLOBAL Pre  22 %      Scores of 19 and below usually indicate a poorer quality of life in these areas.  A difference of  2-3 points is a clinically meaningful difference.  A difference of 2-3 points in the total score of the Quality of Life Index has been associated with significant improvement in overall quality of life, self-image, physical symptoms, and general health in studies assessing change in quality of life.  PHQ-9: Recent Review Flowsheet Data    Depression screen [facility] 2/9 08/11/2019 04/21/2019 11/06/2018 10/19/2018 09/22/2018   Decreased Interest 1 2 0 0 0   Down, Depressed, Hopeless 0 1 0 0 0   PHQ - 2 Score 1 3 0 0 0   Altered sleeping 3 3 - - -   Tired, decreased energy 0 2 - - -   Change in appetite 1 0 - - -   Feeling bad or failure about yourself  0 0 - - -   Trouble concentrating 0 0 - - -   Moving slowly or fidgety/restless 0 0 - - -   Suicidal thoughts 0 0 - - -   PHQ-9 Score 5 8 - - -   Difficult doing work/chores Not difficult at all Not difficult at all - - -     Interpretation of Total Score  Total Score Depression Severity:  1-4 = Minimal depression, 5-9 = Mild depression, 10-14 = Moderate depression, 15-19 = Moderately severe depression, 20-27 = Severe depression   Psychosocial Evaluation and Intervention: Psychosocial Evaluation - 08/11/19 1111      Psychosocial Evaluation & Interventions   Interventions  Encouraged to exercise with the program and follow exercise prescription    Continue Psychosocial Services    No Follow up required       Psychosocial Re-Evaluation: Psychosocial Re-Evaluation    Row Name 08/25/19 1446             Psychosocial Re-Evaluation   Current issues with  None Identified       Comments  Patient's initial QOL score was 22 and his PHQ-9 score was 5 with no psychosocial issues identified at orientation. Will continue to monitor for progress.       Expected Outcomes  Patient will have no psychosocial issues identified at discharge.       Interventions  Stress management education;Encouraged to attend Cardiac Rehabilitation for the exercise       Continue Psychosocial Services   No Follow up required          Psychosocial Discharge (Final Psychosocial Re-Evaluation): Psychosocial Re-Evaluation - 08/25/19 1446      Psychosocial Re-Evaluation   Current issues with  None Identified    Comments  Patient's initial QOL score was 22 and his PHQ-9 score was 5 with no psychosocial issues identified at orientation. Will continue to monitor for progress.    Expected Outcomes  Patient will have no psychosocial issues identified at discharge.    Interventions  Stress management education;Encouraged to attend Cardiac Rehabilitation for the exercise    Continue Psychosocial Services   No Follow  up required       Vocational Rehabilitation: Provide vocational rehab assistance to qualifying candidates.   Vocational Rehab Evaluation & Intervention: Vocational Rehab - 08/11/19 1117      Initial Vocational Rehab Evaluation & Intervention   Assessment shows need for Vocational Rehabilitation  No       Education: Education Goals: Education classes will be provided on a weekly basis, covering required topics. Participant will state understanding/return demonstration of topics presented.  Learning Barriers/Preferences: Learning Barriers/Preferences - 08/11/19 1115      Learning Barriers/Preferences   Learning Barriers  None    Learning Preferences  Audio;Computer/Internet;Group  Instruction;Individual Instruction;Pictoral;Skilled Demonstration;Verbal Instruction;Video;Written Material       Education Topics: Hypertension, Hypertension Reduction -Define heart disease and high blood pressure. Discus how high blood pressure affects the body and ways to reduce high blood pressure.   Exercise and Your Heart -Discuss why it is important to exercise, the FITT principles of exercise, normal and abnormal responses to exercise, and how to exercise safely.   Angina -Discuss definition of angina, causes of angina, treatment of angina, and how to decrease risk of having angina.   Cardiac Medications -Review what the following cardiac medications are used for, how they affect the body, and side effects that may occur when taking the medications.  Medications include Aspirin, Beta blockers, calcium channel blockers, ACE Inhibitors, angiotensin receptor blockers, diuretics, digoxin, and antihyperlipidemics.   Congestive Heart Failure -Discuss the definition of CHF, how to live with CHF, the signs and symptoms of CHF, and how keep track of weight and sodium intake.   Heart Disease and Intimacy -Discus the effect sexual activity has on the heart, how changes occur during intimacy as we age, and safety during sexual activity.   Smoking Cessation / COPD -Discuss different methods to quit smoking, the health benefits of quitting smoking, and the definition of COPD.   Nutrition I: Fats -Discuss the types of cholesterol, what cholesterol does to the heart, and how cholesterol levels can be controlled.   Nutrition II: Labels -Discuss the different components of food labels and how to read food label   CARDIAC REHAB PHASE II EXERCISE from 08/18/2019 in [place]  Date  08/18/19  Educator  DC  Instruction Review Code  2- Demonstrated Understanding      Heart Parts/Heart Disease and PAD -Discuss the anatomy of the heart, the pathway of blood  circulation through the heart, and these are affected by heart disease.   Stress I: Signs and Symptoms -Discuss the causes of stress, how stress may lead to anxiety and depression, and ways to limit stress.   Stress II: Relaxation -Discuss different types of relaxation techniques to limit stress.   Warning Signs of Stroke / TIA -Discuss definition of a stroke, what the signs and symptoms are of a stroke, and how to identify when someone is having stroke.   Knowledge Questionnaire Score: Knowledge Questionnaire Score - 08/11/19 1117      Knowledge Questionnaire Score   Pre Score  18/24       Core Components/Risk Factors/Patient Goals at Admission: Personal Goals and Risk Factors at Admission - 08/11/19 1117      Core Components/Risk Factors/Patient Goals on Admission    Weight Management  Weight Maintenance    Personal Goal Other  Yes    Personal Goal  Walk 1 mile without falling down or SOB, Increase stamina, be able to work normally w/o SOB    Intervention  Attend CR  3xweek and supplement with at home exercise plan 2 xweek    Expected Outcomes  Reach goals.       Core Components/Risk Factors/Patient Goals Review:  Goals and Risk Factor Review    Row Name 08/25/19 1444             Core Components/Risk Factors/Patient Goals Review   Personal Goals Review  Weight Management/Obesity;Other Increase stamina. Work normal w/o SOB; bring in groceries; walk 1 mile w/o SOB.       Review  Patient is new to the program completing 5 sessions. He has gained 2.6  lbs since his initial visit. He is working toward meeting his personal goals. Will continue to monitor for progress.       Expected Outcomes  Patient will continue to attend sessions and complete the program meeting his personal goals.          Core Components/Risk Factors/Patient Goals at Discharge (Final Review):  Goals and Risk Factor Review - 08/25/19 1444      Core Components/Risk Factors/Patient Goals Review    Personal Goals Review  Weight Management/Obesity;Other   Increase stamina. Work normal w/o SOB; bring in groceries; walk 1 mile w/o SOB.   Review  Patient is new to the program completing 5 sessions. He has gained 2.6  lbs since his initial visit. He is working toward meeting his personal goals. Will continue to monitor for progress.    Expected Outcomes  Patient will continue to attend sessions and complete the program meeting his personal goals.       ITP Comments: ITP Comments    Row Name 08/11/19 1014           ITP Comments  Patient is getting started after having by-pass surgery with complications. He is not all that excited about exercising but does see the importance of the program and how it will benefit him.          Comments: ITP REVIEW Pt is making expected progress toward Cardiac Rehab goals after completing 5 sessions. Recommend continued exercise, life style modification, education, and increased stamina and strength.

## 2019-08-27 ENCOUNTER — Ambulatory Visit (INDEPENDENT_AMBULATORY_CARE_PROVIDER_SITE_OTHER): Payer: Medicare Other | Admitting: Family Medicine

## 2019-08-27 ENCOUNTER — Encounter: Payer: Self-pay | Admitting: Family Medicine

## 2019-08-27 ENCOUNTER — Encounter (HOSPITAL_COMMUNITY): Payer: Medicare Other

## 2019-08-27 ENCOUNTER — Other Ambulatory Visit: Payer: Self-pay

## 2019-08-27 DIAGNOSIS — I2510 Atherosclerotic heart disease of native coronary artery without angina pectoris: Secondary | ICD-10-CM | POA: Diagnosis not present

## 2019-08-27 DIAGNOSIS — M791 Myalgia, unspecified site: Secondary | ICD-10-CM | POA: Diagnosis not present

## 2019-08-27 DIAGNOSIS — J069 Acute upper respiratory infection, unspecified: Secondary | ICD-10-CM | POA: Diagnosis not present

## 2019-08-27 NOTE — Progress Notes (Signed)
Virtual Visit via Telephone Note  I connected with Benjamin [name] on 08/27/19 at 11:36AM by telephone and verified that I am speaking with the correct person using two identifiers.       Pt location: at home   Physician location:  In office, [facility], Vic Blackbird MD     On call: patient and physician   I discussed the limitations, risks, security and privacy concerns of performing an evaluation and management service by telephone and the availability of in person appointments. I also discussed with the patient that there may be a patient responsible charge related to this service. The patient expressed understanding and agreed to proceed.   History of Present Illness:  Pt called in 2 days ago he started with, runny eyes, and nose.  Did start taking allergy medication.  He has been having some myalgias as well states that he feels like he has the flu.  He denies any sore throat sinus pressure denies any GI symptoms no change in taste or smell.  He denies any cough shortness of breath.  No known sick contacts.  He has been taking Allegra which is helped.  He denies any fever and he has not been taking any fever reducers.  Observations/Objective: No acute distress noted over the phone  Assessment and Plan:  Early viral symptoms.  Because he is high risk I would recommend he get Covid testing he is going to proceed to a local testing site.  Also continue the Allegra as this is helping symptomatically.  His wife is also going to get tested though she is asymptomatic   follow Up Instructions:    I discussed the assessment and treatment plan with the patient. The patient was provided an opportunity to ask questions and all were answered. The patient agreed with the plan and demonstrated an understanding of the instructions.   The patient was advised to call back or seek an in-person evaluation if the symptoms worsen or if the condition fails to improve as anticipated.  I  provided 6 minutes of non-face-to-face time during this encounter. End Time 11:42am   Vic Blackbird, MD

## 2019-08-30 ENCOUNTER — Encounter (HOSPITAL_COMMUNITY): Payer: Medicare Other

## 2019-09-01 ENCOUNTER — Encounter (HOSPITAL_COMMUNITY): Payer: Medicare Other

## 2019-09-01 ENCOUNTER — Other Ambulatory Visit: Payer: Self-pay

## 2019-09-01 ENCOUNTER — Ambulatory Visit: Payer: Medicare Other | Attending: Internal Medicine

## 2019-09-01 DIAGNOSIS — Z20822 Contact with and (suspected) exposure to covid-19: Secondary | ICD-10-CM

## 2019-09-03 ENCOUNTER — Encounter (HOSPITAL_COMMUNITY): Payer: Medicare Other

## 2019-09-03 LAB — NOVEL CORONAVIRUS, NAA: SARS-CoV-2, NAA: NOT DETECTED

## 2019-09-06 ENCOUNTER — Encounter (HOSPITAL_COMMUNITY): Payer: Medicare Other

## 2019-09-06 ENCOUNTER — Telehealth: Payer: Self-pay | Admitting: *Deleted

## 2019-09-06 MED ORDER — AMOXICILLIN 875 MG PO TABS: 875.0000 mg | ORAL_TABLET | Freq: Two times a day (BID) | ORAL | 0 refills | Status: DC

## 2019-09-06 NOTE — Telephone Encounter (Signed)
Amoxicillin 975 mg pobid x 10 days

## 2019-09-06 NOTE — Telephone Encounter (Signed)
Received call from patient.   Patient reports that he had telehealth visit with PCP on 08/27/2019 with early viral Sx. States that he was also tested for COIVD, and results noted negative.   Reports that he continues to have nasal drainage that has moved to post nasal drip. States that drainage is now yellow in color.   Denies fever, SOB, cough, sore throat.  Requested ABTx.   MD please advise.

## 2019-09-06 NOTE — Telephone Encounter (Signed)
Prescription sent to pharmacy. .   Call placed to patient and patient made aware.  

## 2019-09-08 ENCOUNTER — Encounter (HOSPITAL_COMMUNITY): Payer: Medicare Other

## 2019-09-10 ENCOUNTER — Encounter (HOSPITAL_COMMUNITY): Payer: Medicare Other

## 2019-09-13 ENCOUNTER — Other Ambulatory Visit: Payer: Self-pay

## 2019-09-13 ENCOUNTER — Encounter (HOSPITAL_COMMUNITY)
Admission: RE | Admit: 2019-09-13 | Discharge: 2019-09-13 | Disposition: A | Discharge status: Home / Self Care | Payer: Medicare Other | Source: Ambulatory Visit | Attending: Cardiovascular Disease | Admitting: Cardiovascular Disease

## 2019-09-13 DIAGNOSIS — Z951 Presence of aortocoronary bypass graft: Secondary | ICD-10-CM

## 2019-09-13 NOTE — Progress Notes (Signed)
Daily Session Note  Patient Details  Name: Donney Caraveo MRN: [id number] Date of Birth: [birth year] Referring Provider:     [facility] from 08/11/2019 in [place]  Referring Provider  Bronson Ing      Encounter Date: 09/13/2019  Check In: Session Check In - 09/13/19 0815      Check-In   Supervising physician immediately available to respond to emergencies  See telemetry face sheet for immediately available MD    Location  [facility]    Staff Present  Russella Dar, MS, EP, [facility], Exercise Physiologist;Other    Virtual Visit  No    Medication changes reported      No    Fall or balance concerns reported     No    Tobacco Cessation  No Change    Warm-up and Cool-down  Performed as group-led instruction    Resistance Training Performed  Yes    VAD Patient?  No    PAD/SET Patient?  No      Pain Assessment   Currently in Pain?  No/denies    Pain Score  0-No pain    Multiple Pain Sites  No       Capillary Blood Glucose: No results found for this or any previous visit (from the past 24 hour(s)).    Social History   Tobacco Use  Smoking Status Former Smoker  . Packs/day: 0.50  . Years: 50.00  . Pack years: 25.00  . Types: Cigarettes  . Quit date: 02/14/2014  . Years since quitting: 5.5  Smokeless Tobacco Never Used  Tobacco Comment   Quit x 1 year    Goals Met:  Independence with exercise equipment Exercise tolerated well Personal goals reviewed No report of cardiac concerns or symptoms Strength training completed today  Goals Unmet:  Not Applicable  Comments: Check out: [place]   Cardiac Medical Director: Kate Sable

## 2019-09-15 ENCOUNTER — Encounter (HOSPITAL_COMMUNITY)
Admission: RE | Admit: 2019-09-15 | Discharge: 2019-09-15 | Disposition: A | Discharge status: Home / Self Care | Payer: Medicare Other | Source: Ambulatory Visit | Attending: Cardiovascular Disease | Admitting: Cardiovascular Disease

## 2019-09-15 ENCOUNTER — Other Ambulatory Visit: Payer: Self-pay

## 2019-09-15 DIAGNOSIS — Z951 Presence of aortocoronary bypass graft: Secondary | ICD-10-CM | POA: Diagnosis not present

## 2019-09-15 NOTE — Progress Notes (Signed)
Daily Session Note  Patient Details  Name: Ova Meegan MRN: [id number] Date of Birth: [birth year] Referring Provider:     [facility] from 08/11/2019 in [place]  Referring Provider  Bronson Ing      Encounter Date: 09/15/2019  Check In:   Capillary Blood Glucose: No results found for this or any previous visit (from the past 24 hour(s)).    Social History   Tobacco Use  Smoking Status Former Smoker  . Packs/day: 0.50  . Years: 50.00  . Pack years: 25.00  . Types: Cigarettes  . Quit date: 02/14/2014  . Years since quitting: 5.5  Smokeless Tobacco Never Used  Tobacco Comment   Quit x 1 year    Goals Met:  Independence with exercise equipment Exercise tolerated well Personal goals reviewed Strength training completed today  Goals Unmet:  Not Applicable  Comments: Check out at 9:15am.   Dr. Kate Sable is Medical Director for [facility] and [facility].

## 2019-09-17 ENCOUNTER — Encounter (HOSPITAL_COMMUNITY): Payer: Medicare Other

## 2019-09-20 ENCOUNTER — Encounter (HOSPITAL_COMMUNITY): Payer: Medicare Other

## 2019-09-22 ENCOUNTER — Encounter (HOSPITAL_COMMUNITY): Payer: Medicare Other

## 2019-09-24 ENCOUNTER — Encounter (HOSPITAL_COMMUNITY): Payer: Medicare Other

## 2019-09-24 NOTE — Progress Notes (Signed)
Cardiac Individual Treatment Plan  Patient Details  Name: Benjamin [name] MRN: [id number] Date of Birth: 1941/08/20 Referring Provider:     [place] from 08/11/2019 in [place]  Referring Provider  Bronson Ing      Initial Encounter Date:    CARDIAC REHAB PHASE II ORIENTATION from 08/11/2019 in [place]  Date  08/11/19      Visit Diagnosis: S/P CABG x 3  Patient's Home Medications on Admission:  Current Outpatient Medications:  .  acetaminophen (TYLENOL) 500 MG tablet, Take 1,000 mg by mouth every 6 (six) hours as needed for moderate pain or headache., Disp: , Rfl:  .  amoxicillin (AMOXIL) 875 MG tablet, Take 1 tablet (875 mg total) by mouth 2 (two) times daily., Disp: 20 tablet, Rfl: 0 .  aspirin EC 81 MG tablet, Take 81 mg by mouth daily., Disp: , Rfl:  .  ezetimibe (ZETIA) 10 MG tablet, Take 1 tablet (10 mg total) by mouth daily., Disp: 30 tablet, Rfl: 5 .  loperamide (IMODIUM A-D) 2 MG tablet, Take 2 mg by mouth 4 (four) times daily as needed for diarrhea or loose stools., Disp: , Rfl:  .  metoprolol tartrate (LOPRESSOR) 25 MG tablet, Take 0.5 tablets (12.5 mg total) by mouth 2 (two) times daily., Disp: 90 tablet, Rfl: 3 .  Omega-3 Fatty Acids (FISH OIL) 1000 MG CAPS, Take 1 capsule by mouth 2 (two) times daily., Disp: , Rfl:  .  promethazine (PHENERGAN) 12.5 MG tablet, Take 12.5 mg by mouth every 6 (six) hours as needed for nausea or vomiting., Disp: , Rfl:  .  RABEprazole (ACIPHEX) 20 MG tablet, Take 20 mg by mouth daily before breakfast., Disp: , Rfl:  .  tamsulosin (FLOMAX) 0.4 MG CAPS capsule, Take 2 capsules (0.8 mg total) by mouth daily after supper. For prostate (Patient taking differently: Take 0.4 mg by mouth 2 (two) times daily. ), Disp: 180 capsule, Rfl: 2  Past Medical History: Past Medical History:  Diagnosis Date  . Abnormal EKG    History per report of ST elevations x 20 years, no cardiac  history-anomale with aorta-sts "it always looks like i am having a heart atttack on my EKG  . Arthritis    R wrist, R neck, L wrist, multiple joints- OA- CBD oil treats this problem    . Cancer [facility])    pathology is pending, plan for freezing area - L thigh- 03/17/2019  . COPD (chronic obstructive pulmonary disease) ([place])    patient denies  . Coronary artery disease    a. s/p CABG in 02/2019 with LIMA-LAD, SVG-D1, SVG-OM and SVG-RCA  . Dyspnea   . Enlarged prostate   . GERD (gastroesophageal reflux disease)   . Headache(784.0)    last one 5 yrs ago  . Sternal pain   . Vision problems    Blind x 20 years, regained site 2000, ? optic nerve injury    Tobacco Use: Social History   Tobacco Use  Smoking Status Former Smoker  . Packs/day: 0.50  . Years: 50.00  . Pack years: 25.00  . Types: Cigarettes  . Quit date: 02/14/2014  . Years since quitting: 5.6  Smokeless Tobacco Never Used  Tobacco Comment   Quit x 1 year    Labs: Recent Review Flowsheet Data    Labs for ITP Cardiac and Pulmonary Rehab Latest Ref Rng & Units 03/01/2019 03/01/2019 03/01/2019 03/02/2019 08/17/2019   Cholestrol <200 mg/dL - - - -  144   LDLCALC mg/dL (calc) - - - - 92   HDL > OR = 40 mg/dL - - - - 33(L)   Trlycerides <150 mg/dL - - - - 101   Hemoglobin A1c <5.7 % of total Hgb - - - - 6.0(H)   PHART 7.350 - 7.450 7.356 7.370 7.371 7.415 -   PCO2ART 32.0 - 48.0 mmHg 42.5 38.7 36.2 36.1 -   HCO3 20.0 - 28.0 mmol/L 24.0 22.4 20.9 23.1 -   TCO2 22 - 32 mmol/L 25 24 22 24  -   ACIDBASEDEF 0.0 - 2.0 mmol/L 2.0 3.0(H) 4.0(H) 1.0 -   O2SAT % 95.0 94.0 90.0 94.0 -      Capillary Blood Glucose: Lab Results  Component Value Date   GLUCAP 138 (H) 03/03/2019   GLUCAP 102 (H) 03/03/2019   GLUCAP 168 (H) 03/03/2019   GLUCAP 138 (H) 03/03/2019   GLUCAP 107 (H) 03/02/2019     Exercise Target Goals: Exercise Program Goal: Individual exercise prescription set using results from initial 6 min walk test and THRR while  considering  patient's activity barriers and safety.   Exercise Prescription Goal: Starting with aerobic activity 30 plus minutes a day, 3 days per week for initial exercise prescription. Provide home exercise prescription and guidelines that participant acknowledges understanding prior to discharge.  Activity Barriers & Risk Stratification: Activity Barriers & Cardiac Risk Stratification - 08/11/19 1016      Activity Barriers & Cardiac Risk Stratification   Activity Barriers  Balance Concerns   Possible knee giving away at times   Cardiac Risk Stratification  High       6 Minute Walk: 6 Minute Walk    Row Name 08/11/19 0959         6 Minute Walk   Phase  Initial     Distance  1200 feet     Walk Time  6 minutes     # of Rest Breaks  0     MPH  2.27     METS  2.74     RPE  11     Perceived Dyspnea   9     Symptoms  No     Resting HR  60 bpm     Resting BP  110/78     Resting Oxygen Saturation   92 %     Exercise Oxygen Saturation  during 6 min walk  92 %     Max Ex. HR  86 bpm     Max Ex. BP  140/70     2 Minute Post BP  110/60        Oxygen Initial Assessment:   Oxygen Re-Evaluation:   Oxygen Discharge (Final Oxygen Re-Evaluation):   Initial Exercise Prescription: Initial Exercise Prescription - 08/11/19 1000      Date of Initial Exercise RX and Referring Provider   Date  08/11/19    Referring Provider  [facility]    Expected Discharge Date  10/11/19      Treadmill   MPH  1.5    Grade  0    Minutes  17    METs  2.14      T5 Nustep   Level  1    SPM  91    Minutes  22    METs  1.7      Prescription Details   Frequency (times per week)  3    Duration  Progress to 30 minutes of continuous aerobic without signs/symptoms  of physical distress      Intensity   THRR 40-80% of Max Heartrate  93-110-126    Ratings of Perceived Exertion  11-13    Perceived Dyspnea  0-4      Progression   Progression  Continue to progress workloads to maintain  intensity without signs/symptoms of physical distress.      Resistance Training   Training Prescription  Yes    Weight  1    Reps  10-15       Perform Capillary Blood Glucose checks as needed.  Exercise Prescription Changes:  Exercise Prescription Changes    Row Name 08/24/19 1700             Response to Exercise   Blood Pressure (Admit)  110/72       Blood Pressure (Exercise)  108/60       Blood Pressure (Exit)  98/60       Heart Rate (Admit)  80 bpm       Heart Rate (Exercise)  101 bpm       Heart Rate (Exit)  87 bpm       Symptoms  none       Comments  doing well       Duration  Continue with 30 min of aerobic exercise without signs/symptoms of physical distress.       Intensity  THRR unchanged         Progression   Progression  Continue to progress workloads to maintain intensity without signs/symptoms of physical distress.       Average METs  2.37         Resistance Training   Training Prescription  Yes       Weight  2       Reps  10-15         Treadmill   MPH  1.8       Grade  0       Minutes  17       METs  2.37         T5 Nustep   Level  2       SPM  97       Minutes  22       METs  2         Home Exercise Plan   Plans to continue exercise at  Home (comment)       Frequency  Add 2 additional days to program exercise sessions.       Initial Home Exercises Provided  08/11/19          Exercise Comments:  Exercise Comments    Row Name 08/24/19 1721           Exercise Comments  Ignatz is doing very well in the program. He has only been coming 2 weeks. In these 2 weeks he has shown that he is ready to be progressed to 2lbs weights, to have his mph increased on the TM and increase the level on the Nustep. As we progress him every 2 weeks he should be able to notice his stamina increasing which is apart of his goals.          Exercise Goals and Review:  Exercise Goals    Row Name 08/11/19 1021             Exercise Goals   Increase  Physical Activity  Yes       Expected Outcomes  Short Term: Attend  rehab on a regular basis to increase amount of physical activity.;Long Term: Exercising regularly at least 3-5 days a week.       Increase Strength and Stamina  Yes       Intervention  Provide advice, education, support and counseling about physical activity/exercise needs.;Develop an individualized exercise prescription for aerobic and resistive training based on initial evaluation findings, risk stratification, comorbidities and participant's personal goals.       Expected Outcomes  Short Term: Increase workloads from initial exercise prescription for resistance, speed, and METs.;Long Term: Improve cardiorespiratory fitness, muscular endurance and strength as measured by increased METs and functional capacity (6MWT)       Able to understand and use rate of perceived exertion (RPE) scale  Yes       Intervention  Provide education and explanation on how to use RPE scale       Expected Outcomes  Short Term: Able to use RPE daily in rehab to express subjective intensity level;Long Term:  Able to use RPE to guide intensity level when exercising independently       Knowledge and understanding of Target Heart Rate Range (THRR)  Yes       Expected Outcomes  Short Term: Able to state/look up THRR;Long Term: Able to use THRR to govern intensity when exercising independently       Able to check pulse independently  Yes       Intervention  Provide education and demonstration on how to check pulse in carotid and radial arteries.       Expected Outcomes  Short Term: Able to explain why pulse checking is important during independent exercise;Long Term: Able to check pulse independently and accurately       Understanding of Exercise Prescription  Yes       Intervention  Provide education, explanation, and written materials on patient's individual exercise prescription       Expected Outcomes  Short Term: Able to explain program exercise  prescription;Long Term: Able to explain home exercise prescription to exercise independently          Exercise Goals Re-Evaluation : Exercise Goals Re-Evaluation    Row Name 08/24/19 1720             Exercise Goal Re-Evaluation   Exercise Goals Review  Increase Physical Activity;Increase Strength and Stamina Be able to walk a mile w/o falling down or becoming SOB       Comments  Nicholaus's main goals is to be able to walk a mile w/o falling down or geeting SOB. He aslo wants to be able to bring in the groceries w/o SOB. He would like to also see his stamina increase over time w/o SOB.       Expected Outcomes  To be able to achieve his goals before graduation           Discharge Exercise Prescription (Final Exercise Prescription Changes): Exercise Prescription Changes - 08/24/19 1700      Response to Exercise   Blood Pressure (Admit)  110/72    Blood Pressure (Exercise)  108/60    Blood Pressure (Exit)  98/60    Heart Rate (Admit)  80 bpm    Heart Rate (Exercise)  101 bpm    Heart Rate (Exit)  87 bpm    Symptoms  none    Comments  doing well    Duration  Continue with 30 min of aerobic exercise without signs/symptoms of physical distress.    Intensity  THRR unchanged  Progression   Progression  Continue to progress workloads to maintain intensity without signs/symptoms of physical distress.    Average METs  2.37      Resistance Training   Training Prescription  Yes    Weight  2    Reps  10-15      Treadmill   MPH  1.8    Grade  0    Minutes  17    METs  2.37      T5 Nustep   Level  2    SPM  97    Minutes  22    METs  2      Home Exercise Plan   Plans to continue exercise at  Home (comment)    Frequency  Add 2 additional days to program exercise sessions.    Initial Home Exercises Provided  08/11/19       Nutrition:  Target Goals: Understanding of nutrition guidelines, daily intake of sodium 1500mg , cholesterol 200mg , calories 30% from fat and 7% or  less from saturated fats, daily to have 5 or more servings of fruits and vegetables.  Biometrics: Pre Biometrics - 08/11/19 1025      Pre Biometrics   Height  5\' 11"  (1.803 m)    Weight  84.7 kg    Waist Circumference  37.5 inches    Hip Circumference  39.5 inches    Waist to Hip Ratio  0.95 %    BMI (Calculated)  26.05    Triceps Skinfold  10 mm    % Body Fat  24.1 %    Grip Strength  28.6 kg    Flexibility  8.8 in    Single Leg Stand  4.87 seconds        Nutrition Therapy Plan and Nutrition Goals: Nutrition Therapy & Goals - 08/25/19 1441      Personal Nutrition Goals   Comments  We have resumed our RD class and are currently working on scheduling patients. In the intermin, we are providing education through hand-oust. He scored a 67 on his medficts diet assessment. Will continue to monitor for progress.      Intervention Plan   Intervention  Nutrition handout(s) given to patient.    Expected Outcomes  Short Term Goal: Understand basic principles of dietary content, such as calories, fat, sodium, cholesterol and nutrients.       Nutrition Assessments: Nutrition Assessments - 08/11/19 1115      MEDFICTS Scores   Pre Score  67       Nutrition Goals Re-Evaluation:   Nutrition Goals Discharge (Final Nutrition Goals Re-Evaluation):   Psychosocial: Target Goals: Acknowledge presence or absence of significant depression and/or stress, maximize coping skills, provide positive support system. Participant is able to verbalize types and ability to use techniques and skills needed for reducing stress and depression.  Initial Review & Psychosocial Screening: Initial Psych Review & Screening - 08/11/19 1110      Initial Review   Current issues with  None Identified      Family Dynamics   Good Support System?  Yes      Barriers   Psychosocial barriers to participate in program  There are no identifiable barriers or psychosocial needs.      Screening Interventions    Interventions  Encouraged to exercise    Expected Outcomes  Short Term goal: Identification and review with participant of any Quality of Life or Depression concerns found by scoring the questionnaire.;Long Term goal: The participant improves  quality of Life and PHQ9 Scores as seen by post scores and/or verbalization of changes       Quality of Life Scores: Quality of Life - 08/11/19 1111      Quality of Life   Select  Quality of Life      Quality of Life Scores   Health/Function Pre  19.14 %    Socioeconomic Pre  24.64 %    Psych/Spiritual Pre  23.07 %    Family Pre  25.5 %    GLOBAL Pre  22 %      Scores of 19 and below usually indicate a poorer quality of life in these areas.  A difference of  2-3 points is a clinically meaningful difference.  A difference of 2-3 points in the total score of the Quality of Life Index has been associated with significant improvement in overall quality of life, self-image, physical symptoms, and general health in studies assessing change in quality of life.  PHQ-9: Recent Review Flowsheet Data    Depression screen [facility] 2/9 08/11/2019 04/21/2019 11/06/2018 10/19/2018 09/22/2018   Decreased Interest 1 2 0 0 0   Down, Depressed, Hopeless 0 1 0 0 0   PHQ - 2 Score 1 3 0 0 0   Altered sleeping 3 3 - - -   Tired, decreased energy 0 2 - - -   Change in appetite 1 0 - - -   Feeling bad or failure about yourself  0 0 - - -   Trouble concentrating 0 0 - - -   Moving slowly or fidgety/restless 0 0 - - -   Suicidal thoughts 0 0 - - -   PHQ-9 Score 5 8 - - -   Difficult doing work/chores Not difficult at all Not difficult at all - - -     Interpretation of Total Score  Total Score Depression Severity:  1-4 = Minimal depression, 5-9 = Mild depression, 10-14 = Moderate depression, 15-19 = Moderately severe depression, 20-27 = Severe depression   Psychosocial Evaluation and Intervention: Psychosocial Evaluation - 08/11/19 1111      Psychosocial Evaluation &  Interventions   Interventions  Encouraged to exercise with the program and follow exercise prescription    Continue Psychosocial Services   No Follow up required       Psychosocial Re-Evaluation: Psychosocial Re-Evaluation    Row Name 08/25/19 1446             Psychosocial Re-Evaluation   Current issues with  None Identified       Comments  Patient's initial QOL score was 22 and his PHQ-9 score was 5 with no psychosocial issues identified at orientation. Will continue to monitor for progress.       Expected Outcomes  Patient will have no psychosocial issues identified at discharge.       Interventions  Stress management education;Encouraged to attend Cardiac Rehabilitation for the exercise       Continue Psychosocial Services   No Follow up required          Psychosocial Discharge (Final Psychosocial Re-Evaluation): Psychosocial Re-Evaluation - 08/25/19 1446      Psychosocial Re-Evaluation   Current issues with  None Identified    Comments  Patient's initial QOL score was 22 and his PHQ-9 score was 5 with no psychosocial issues identified at orientation. Will continue to monitor for progress.    Expected Outcomes  Patient will have no psychosocial issues identified at discharge.    Interventions  Stress management education;Encouraged to attend Cardiac Rehabilitation for the exercise    Continue Psychosocial Services   No Follow up required       Vocational Rehabilitation: Provide vocational rehab assistance to qualifying candidates.   Vocational Rehab Evaluation & Intervention: Vocational Rehab - 08/11/19 1117      Initial Vocational Rehab Evaluation & Intervention   Assessment shows need for Vocational Rehabilitation  No       Education: Education Goals: Education classes will be provided on a weekly basis, covering required topics. Participant will state understanding/return demonstration of topics presented.  Learning Barriers/Preferences: Learning  Barriers/Preferences - 08/11/19 1115      Learning Barriers/Preferences   Learning Barriers  None    Learning Preferences  Audio;Computer/Internet;Group Instruction;Individual Instruction;Pictoral;Skilled Demonstration;Verbal Instruction;Video;Written Material       Education Topics: Hypertension, Hypertension Reduction -Define heart disease and high blood pressure. Discus how high blood pressure affects the body and ways to reduce high blood pressure.   Exercise and Your Heart -Discuss why it is important to exercise, the FITT principles of exercise, normal and abnormal responses to exercise, and how to exercise safely.   Angina -Discuss definition of angina, causes of angina, treatment of angina, and how to decrease risk of having angina.   Cardiac Medications -Review what the following cardiac medications are used for, how they affect the body, and side effects that may occur when taking the medications.  Medications include Aspirin, Beta blockers, calcium channel blockers, ACE Inhibitors, angiotensin receptor blockers, diuretics, digoxin, and antihyperlipidemics.   Congestive Heart Failure -Discuss the definition of CHF, how to live with CHF, the signs and symptoms of CHF, and how keep track of weight and sodium intake.   Heart Disease and Intimacy -Discus the effect sexual activity has on the heart, how changes occur during intimacy as we age, and safety during sexual activity.   Smoking Cessation / COPD -Discuss different methods to quit smoking, the health benefits of quitting smoking, and the definition of COPD.   Nutrition I: Fats -Discuss the types of cholesterol, what cholesterol does to the heart, and how cholesterol levels can be controlled.   Nutrition II: Labels -Discuss the different components of food labels and how to read food label   [place] from 09/15/2019 in [place]  Date  08/18/19  Educator  DC   Instruction Review Code  2- Demonstrated Understanding      Heart Parts/Heart Disease and PAD -Discuss the anatomy of the heart, the pathway of blood circulation through the heart, and these are affected by heart disease.   Stress I: Signs and Symptoms -Discuss the causes of stress, how stress may lead to anxiety and depression, and ways to limit stress.   Stress II: Relaxation -Discuss different types of relaxation techniques to limit stress.   Warning Signs of Stroke / TIA -Discuss definition of a stroke, what the signs and symptoms are of a stroke, and how to identify when someone is having stroke.   CARDIAC REHAB PHASE II EXERCISE from 09/15/2019 in [place]  Date  09/15/19  Educator  DJ  Instruction Review Code  1- Verbalizes Understanding      Knowledge Questionnaire Score: Knowledge Questionnaire Score - 08/11/19 1117      Knowledge Questionnaire Score   Pre Score  18/24       Core Components/Risk Factors/Patient Goals at Admission: Personal Goals and Risk Factors at Admission - 08/11/19 1117  Core Components/Risk Factors/Patient Goals on Admission    Weight Management  Weight Maintenance    Personal Goal Other  Yes    Personal Goal  Walk 1 mile without falling down or SOB, Increase stamina, be able to work normally w/o SOB    Intervention  Attend CR 3xweek and supplement with at home exercise plan 2 xweek    Expected Outcomes  Reach goals.       Core Components/Risk Factors/Patient Goals Review:  Goals and Risk Factor Review    Row Name 08/25/19 1444             Core Components/Risk Factors/Patient Goals Review   Personal Goals Review  Weight Management/Obesity;Other Increase stamina. Work normal w/o SOB; bring in groceries; walk 1 mile w/o SOB.       Review  Patient is new to the program completing 5 sessions. He has gained 2.6  lbs since his initial visit. He is working toward meeting his personal goals. Will continue to  monitor for progress.       Expected Outcomes  Patient will continue to attend sessions and complete the program meeting his personal goals.          Core Components/Risk Factors/Patient Goals at Discharge (Final Review):  Goals and Risk Factor Review - 08/25/19 1444      Core Components/Risk Factors/Patient Goals Review   Personal Goals Review  Weight Management/Obesity;Other   Increase stamina. Work normal w/o SOB; bring in groceries; walk 1 mile w/o SOB.   Review  Patient is new to the program completing 5 sessions. He has gained 2.6  lbs since his initial visit. He is working toward meeting his personal goals. Will continue to monitor for progress.    Expected Outcomes  Patient will continue to attend sessions and complete the program meeting his personal goals.       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 He is not all that excited about exercising but does see the importance of the program and how it will benefit him.  Cardiac rehab has been suspended due to staff reassignments for COVID surge. Patient wants to return to the program when we reopen to complete the program. He has attended 7 sessions. Will f/u when we reopen.         Comments: ITP REVIEW Cardiac rehab has been suspended due to staff reassignments for COVID surge. Patient wants to return to the program when we reopen to complete the program. He has attended 7 sessions. Will f/u when we reopen

## 2019-09-27 ENCOUNTER — Encounter (HOSPITAL_COMMUNITY): Payer: Medicare Other

## 2019-09-28 ENCOUNTER — Other Ambulatory Visit: Payer: Self-pay | Admitting: Family Medicine

## 2019-09-29 ENCOUNTER — Encounter (HOSPITAL_COMMUNITY): Payer: Medicare Other

## 2019-10-01 ENCOUNTER — Encounter (HOSPITAL_COMMUNITY): Payer: Medicare Other

## 2019-10-04 ENCOUNTER — Encounter (HOSPITAL_COMMUNITY): Payer: Medicare Other

## 2019-10-06 ENCOUNTER — Encounter (HOSPITAL_COMMUNITY): Payer: Medicare Other

## 2019-10-08 ENCOUNTER — Encounter (HOSPITAL_COMMUNITY): Payer: Medicare Other

## 2019-10-11 ENCOUNTER — Encounter (HOSPITAL_COMMUNITY): Payer: Self-pay | Admitting: *Deleted

## 2019-10-11 ENCOUNTER — Other Ambulatory Visit: Payer: Self-pay

## 2019-10-11 ENCOUNTER — Emergency Department (HOSPITAL_COMMUNITY)
Admission: EM | Admit: 2019-10-11 | Discharge: 2019-10-12 | Disposition: A | Discharge status: Home / Self Care | Payer: Medicare Other | Attending: Emergency Medicine | Admitting: Emergency Medicine

## 2019-10-11 ENCOUNTER — Encounter (HOSPITAL_COMMUNITY): Payer: Medicare Other

## 2019-10-11 DIAGNOSIS — W000XXA Fall on same level due to ice and snow, initial encounter: Secondary | ICD-10-CM | POA: Diagnosis not present

## 2019-10-11 DIAGNOSIS — Y9389 Activity, other specified: Secondary | ICD-10-CM | POA: Diagnosis not present

## 2019-10-11 DIAGNOSIS — Y929 Unspecified place or not applicable: Secondary | ICD-10-CM | POA: Diagnosis not present

## 2019-10-11 DIAGNOSIS — I110 Hypertensive heart disease with heart failure: Secondary | ICD-10-CM | POA: Diagnosis not present

## 2019-10-11 DIAGNOSIS — Y999 Unspecified external cause status: Secondary | ICD-10-CM | POA: Diagnosis not present

## 2019-10-11 DIAGNOSIS — I5032 Chronic diastolic (congestive) heart failure: Secondary | ICD-10-CM | POA: Diagnosis not present

## 2019-10-11 DIAGNOSIS — Y92009 Unspecified place in unspecified non-institutional (private) residence as the place of occurrence of the external cause: Secondary | ICD-10-CM

## 2019-10-11 DIAGNOSIS — E119 Type 2 diabetes mellitus without complications: Secondary | ICD-10-CM | POA: Diagnosis not present

## 2019-10-11 DIAGNOSIS — Z7982 Long term (current) use of aspirin: Secondary | ICD-10-CM | POA: Diagnosis not present

## 2019-10-11 DIAGNOSIS — S40011A Contusion of right shoulder, initial encounter: Secondary | ICD-10-CM

## 2019-10-11 DIAGNOSIS — J449 Chronic obstructive pulmonary disease, unspecified: Secondary | ICD-10-CM | POA: Insufficient documentation

## 2019-10-11 DIAGNOSIS — Z87891 Personal history of nicotine dependence: Secondary | ICD-10-CM | POA: Insufficient documentation

## 2019-10-11 DIAGNOSIS — S4991XA Unspecified injury of right shoulder and upper arm, initial encounter: Secondary | ICD-10-CM | POA: Diagnosis present

## 2019-10-11 DIAGNOSIS — Z79899 Other long term (current) drug therapy: Secondary | ICD-10-CM | POA: Diagnosis not present

## 2019-10-11 DIAGNOSIS — W19XXXA Unspecified fall, initial encounter: Secondary | ICD-10-CM

## 2019-10-11 DIAGNOSIS — M25511 Pain in right shoulder: Secondary | ICD-10-CM | POA: Diagnosis not present

## 2019-10-11 NOTE — ED Triage Notes (Signed)
Pt c/o pain to right shoulder after a fall this morning due to ice; pt states the pain has got progressively worse throughout the day with some numbness to his fingers

## 2019-10-12 ENCOUNTER — Emergency Department (HOSPITAL_COMMUNITY): Payer: Medicare Other

## 2019-10-12 DIAGNOSIS — S4991XA Unspecified injury of right shoulder and upper arm, initial encounter: Secondary | ICD-10-CM | POA: Diagnosis not present

## 2019-10-12 DIAGNOSIS — M25511 Pain in right shoulder: Secondary | ICD-10-CM | POA: Diagnosis not present

## 2019-10-12 DIAGNOSIS — S40011A Contusion of right shoulder, initial encounter: Secondary | ICD-10-CM | POA: Diagnosis not present

## 2019-10-12 MED ORDER — FENTANYL CITRATE (PF) 100 MCG/2ML IJ SOLN
50.0000 ug | Freq: Once | INTRAMUSCULAR | Status: AC
  Administered 2019-10-12: 50 ug via INTRAMUSCULAR
  Filled 2019-10-12: qty 2

## 2019-10-12 NOTE — Discharge Instructions (Addendum)
Use ice packs over the painful area.  You can take ibuprofen 400 mg 3-4 times a day and/or acetaminophen 650 mg 3-4 times a day for pain.  You can take them together.  Wear the sling for comfort but do not wear it all the time or you will get a frozen shoulder.  Please work your fingers to help with the numbness.  Please call Dr. Ruthe Mannan office to get a follow-up appointment.
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
## 2019-10-13 ENCOUNTER — Encounter (HOSPITAL_COMMUNITY): Payer: Medicare Other

## 2019-10-15 ENCOUNTER — Encounter (HOSPITAL_COMMUNITY): Payer: Medicare Other

## 2019-10-18 ENCOUNTER — Ambulatory Visit (INDEPENDENT_AMBULATORY_CARE_PROVIDER_SITE_OTHER): Payer: Medicare Other | Admitting: Internal Medicine

## 2019-10-18 ENCOUNTER — Other Ambulatory Visit: Payer: Self-pay

## 2019-10-18 ENCOUNTER — Encounter (HOSPITAL_COMMUNITY): Payer: Medicare Other

## 2019-10-18 ENCOUNTER — Encounter: Payer: Self-pay | Admitting: Internal Medicine

## 2019-10-18 VITALS — BP 92/67 | HR 78 | Ht 71.5 in | Wt 189.4 lb

## 2019-10-18 DIAGNOSIS — M791 Myalgia, unspecified site: Secondary | ICD-10-CM

## 2019-10-18 DIAGNOSIS — T466X5A Adverse effect of antihyperlipidemic and antiarteriosclerotic drugs, initial encounter: Secondary | ICD-10-CM | POA: Diagnosis not present

## 2019-10-18 DIAGNOSIS — I25708 Atherosclerosis of coronary artery bypass graft(s), unspecified, with other forms of angina pectoris: Secondary | ICD-10-CM

## 2019-10-18 DIAGNOSIS — E785 Hyperlipidemia, unspecified: Secondary | ICD-10-CM | POA: Diagnosis not present
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
## 2019-10-18 NOTE — Patient Instructions (Signed)
Medication Instructions:  Dr. Debara Pickett recommends Repatha 140mg /ml (PCSK9). This is an injectable cholesterol medication self-administered once every 14 days. This medication will need prior approval with your insurance company, which we will work on. If the medication is not approved initially, we may need to do an appeal with your insurance. We will keep you updated on this process.   If you need co-pay assistance, please look into the program at healthwellfoundation.org >> disease funds >> hypercholesterolemia. This is an online application or you can call to complete.   *If you need a refill on your cardiac medications before your next appointment, please call your pharmacy*  Lab Work: FASTING lab work in 3-4 months (before next lipid clinic appointment) If you have labs (blood work) drawn today and your tests are completely normal, you will receive your results only by: Marland Kitchen MyChart Message (if you have MyChart) OR . A paper copy in the mail If you have any lab test that is abnormal or we need to change your treatment, we will call you to review the results.  Testing/Procedures: NONE  Follow-Up: At [facility], you and your health needs are our priority.  As part of our continuing mission to provide you with exceptional heart care, we have created designated Provider Care Teams.  These Care Teams include your primary Cardiologist (physician) and Advanced Practice Providers (APPs -  Physician Assistants and Nurse Practitioners) who all work together to provide you with the care you need, when you need it.  Your next appointment:   3-4 month(s) - LIPID CLINIC  The format for your next appointment:   Either In Person or Virtual  Provider:   Raliegh Ip Mali Hilty, MD  Other Instructions

## 2019-10-20 ENCOUNTER — Encounter (HOSPITAL_COMMUNITY): Payer: Medicare Other

## 2019-10-20 DIAGNOSIS — S42024A Nondisplaced fracture of shaft of right clavicle, initial encounter for closed fracture: Secondary | ICD-10-CM | POA: Diagnosis not present

## 2019-10-20 DIAGNOSIS — M5030 Other cervical disc degeneration, unspecified cervical region: Secondary | ICD-10-CM | POA: Diagnosis not present

## 2019-10-21 ENCOUNTER — Telehealth: Payer: Self-pay | Admitting: Internal Medicine

## 2019-10-21 NOTE — Telephone Encounter (Signed)
-----   Message from Fidel Levy, RN sent at 10/18/2019  2:20 PM EST ----- Regarding: repatha prior [employer]  BCBS FEP   ID: [id number]  [place]: [id number]  PCN: [id number]  RxGrp: [id number]   Wants 90 day supply Wants reason for medication on Rx

## 2019-10-21 NOTE — Telephone Encounter (Signed)
PA for repatha sureclick submitted via CMM (Key: [id number])  BCBS FEP - [employer] If FEP/[employer] has not responded to your request within 24 hours, contact [place] at [phone].

## 2019-10-21 NOTE — Progress Notes (Signed)
Discharge Progress Report  Patient Details  Name: Benjamin [name] MRN: [id number] Date of Birth: 1941/03/25 Referring Provider:     [facility] from 08/11/2019 in [place]  Referring Provider  Bronson Ing       Number of Visits: 7  Reason for Discharge:  Early Exit:  Personal  Smoking History:  Social History   Tobacco Use  Smoking Status Former Smoker  . Packs/day: 0.50  . Years: 50.00  . Pack years: 25.00  . Types: Cigarettes  . Quit date: 02/14/2014  . Years since quitting: 5.6  Smokeless Tobacco Never Used  Tobacco Comment   Quit x 1 year    Diagnosis:  S/P CABG x 3  ADL [facility]:   Initial Exercise Prescription: Initial Exercise Prescription - 08/11/19 1000      Date of Initial Exercise RX and Referring Provider   Date  08/11/19    Referring Provider  Koneswaran    Expected Discharge Date  10/11/19      Treadmill   MPH  1.5    Grade  0    Minutes  17    METs  2.14      T5 Nustep   Level  1    SPM  91    Minutes  22    METs  1.7      Prescription Details   Frequency (times per week)  3    Duration  Progress to 30 minutes of continuous aerobic without signs/symptoms of physical distress      Intensity   THRR 40-80% of Max Heartrate  93-110-126    Ratings of Perceived Exertion  11-13    Perceived Dyspnea  0-4      Progression   Progression  Continue to progress workloads to maintain intensity without signs/symptoms of physical distress.      Resistance Training   Training Prescription  Yes    Weight  1    Reps  10-15       Discharge Exercise Prescription (Final Exercise Prescription Changes): Exercise Prescription Changes - 08/24/19 1700      Response to Exercise   Blood Pressure (Admit)  110/72    Blood Pressure (Exercise)  108/60    Blood Pressure (Exit)  98/60    Heart Rate (Admit)  80 bpm    Heart Rate (Exercise)  101 bpm    Heart Rate (Exit)  87 bpm    Symptoms  none    Comments  doing  well    Duration  Continue with 30 min of aerobic exercise without signs/symptoms of physical distress.    Intensity  THRR unchanged      Progression   Progression  Continue to progress workloads to maintain intensity without signs/symptoms of physical distress.    Average METs  2.37      Resistance Training   Training Prescription  Yes    Weight  2    Reps  10-15      Treadmill   MPH  1.8    Grade  0    Minutes  17    METs  2.37      T5 Nustep   Level  2    SPM  97    Minutes  22    METs  2      Home Exercise Plan   Plans to continue exercise at  Home (comment)    Frequency  Add 2 additional days to program exercise sessions.  Initial Home Exercises Provided  08/11/19       Functional Capacity: 6 Minute Walk    Row Name 08/11/19 0959         6 Minute Walk   Phase  Initial     Distance  1200 feet     Walk Time  6 minutes     # of Rest Breaks  0     MPH  2.27     METS  2.74     RPE  11     Perceived Dyspnea   9     Symptoms  No     Resting HR  60 bpm     Resting BP  110/78     Resting Oxygen Saturation   92 %     Exercise Oxygen Saturation  during 6 min walk  92 %     Max Ex. HR  86 bpm     Max Ex. BP  140/70     2 Minute Post BP  110/60        Psychological, QOL, Others - Outcomes: PHQ 2/9: Depression screen [facility] 2/9 08/11/2019 04/21/2019 11/06/2018 10/19/2018 09/22/2018  Decreased Interest 1 2 0 0 0  Down, Depressed, Hopeless 0 1 0 0 0  PHQ - 2 Score 1 3 0 0 0  Altered sleeping 3 3 - - -  Tired, decreased energy 0 2 - - -  Change in appetite 1 0 - - -  Feeling bad or failure about yourself  0 0 - - -  Trouble concentrating 0 0 - - -  Moving slowly or fidgety/restless 0 0 - - -  Suicidal thoughts 0 0 - - -  PHQ-9 Score 5 8 - - -  Difficult doing work/chores Not difficult at all Not difficult at all - - -  Some recent data might be hidden    Quality of Life: Quality of Life - 08/11/19 1111      Quality of Life   Select  Quality of Life       Quality of Life Scores   Health/Function Pre  19.14 %    Socioeconomic Pre  24.64 %    Psych/Spiritual Pre  23.07 %    Family Pre  25.5 %    GLOBAL Pre  22 %       Personal Goals: Goals established at orientation with interventions provided to work toward goal. Personal Goals and Risk Factors at Admission - 08/11/19 1117      Core Components/Risk Factors/Patient Goals on Admission    Weight Management  Weight Maintenance    Personal Goal Other  Yes    Personal Goal  Walk 1 mile without falling down or SOB, Increase stamina, be able to work normally w/o SOB    Intervention  Attend CR 3xweek and supplement with at home exercise plan 2 xweek    Expected Outcomes  Reach goals.        Personal Goals Discharge: Goals and Risk Factor Review    Row Name 08/25/19 1444             Core Components/Risk Factors/Patient Goals Review   Personal Goals Review  Weight Management/Obesity;Other Increase stamina. Work normal w/o SOB; bring in groceries; walk 1 mile w/o SOB.       Review  Patient is new to the program completing 5 sessions. He has gained 2.6  lbs since his initial visit. He is working toward meeting his personal goals. Will continue to  monitor for progress.       Expected Outcomes  Patient will continue to attend sessions and complete the program meeting his personal goals.          Exercise Goals and Review: Exercise Goals    Row Name 08/11/19 1021             Exercise Goals   Increase Physical Activity  Yes       Expected Outcomes  Short Term: Attend rehab on a regular basis to increase amount of physical activity.;Long Term: Exercising regularly at least 3-5 days a week.       Increase Strength and Stamina  Yes       Intervention  Provide advice, education, support and counseling about physical activity/exercise needs.;Develop an individualized exercise prescription for aerobic and resistive training based on initial evaluation findings, risk stratification,  comorbidities and participant's personal goals.       Expected Outcomes  Short Term: Increase workloads from initial exercise prescription for resistance, speed, and METs.;Long Term: Improve cardiorespiratory fitness, muscular endurance and strength as measured by increased METs and functional capacity (6MWT)       Able to understand and use rate of perceived exertion (RPE) scale  Yes       Intervention  Provide education and explanation on how to use RPE scale       Expected Outcomes  Short Term: Able to use RPE daily in rehab to express subjective intensity level;Long Term:  Able to use RPE to guide intensity level when exercising independently       Knowledge and understanding of Target Heart Rate Range (THRR)  Yes       Expected Outcomes  Short Term: Able to state/look up THRR;Long Term: Able to use THRR to govern intensity when exercising independently       Able to check pulse independently  Yes       Intervention  Provide education and demonstration on how to check pulse in carotid and radial arteries.       Expected Outcomes  Short Term: Able to explain why pulse checking is important during independent exercise;Long Term: Able to check pulse independently and accurately       Understanding of Exercise Prescription  Yes       Intervention  Provide education, explanation, and written materials on patient's individual exercise prescription       Expected Outcomes  Short Term: Able to explain program exercise prescription;Long Term: Able to explain home exercise prescription to exercise independently          Exercise Goals Re-Evaluation: Exercise Goals Re-Evaluation    Row Name 08/24/19 1720             Exercise Goal Re-Evaluation   Exercise Goals Review  Increase Physical Activity;Increase Strength and Stamina Be able to walk a mile w/o falling down or becoming SOB       Comments  Ashanti's main goals is to be able to walk a mile w/o falling down or geeting SOB. He aslo wants to be able  to bring in the groceries w/o SOB. He would like to also see his stamina increase over time w/o SOB.       Expected Outcomes  To be able to achieve his goals before graduation          Nutrition & Weight - Outcomes: Pre Biometrics - 08/11/19 1025      Pre Biometrics   Height  5\' 11"  (1.803 m)  Weight  84.7 kg    Waist Circumference  37.5 inches    Hip Circumference  39.5 inches    Waist to Hip Ratio  0.95 %    BMI (Calculated)  26.05    Triceps Skinfold  10 mm    % Body Fat  24.1 %    Grip Strength  28.6 kg    Flexibility  8.8 in    Single Leg Stand  4.87 seconds        Nutrition: Nutrition Therapy & Goals - 08/25/19 1441      Personal Nutrition Goals   Comments  We have resumed our RD class and are currently working on scheduling patients. In the intermin, we are providing education through hand-oust. He scored a 67 on his medficts diet assessment. Will continue to monitor for progress.      Intervention Plan   Intervention  Nutrition handout(s) given to patient.    Expected Outcomes  Short Term Goal: Understand basic principles of dietary content, such as calories, fat, sodium, cholesterol and nutrients.       Nutrition Discharge: Nutrition Assessments - 08/11/19 1115      MEDFICTS Scores   Pre Score  67       Education Questionnaire Score: Knowledge Questionnaire Score - 08/11/19 1117      Knowledge Questionnaire Score   Pre Score  18/24

## 2019-10-21 NOTE — Progress Notes (Signed)
Cardiac Individual Treatment Plan  Patient Details  Name: Benjamin [name] MRN: [id number] Date of Birth: [birth year] Referring Provider:     Espy from 08/11/2019 in [place]  Referring Provider  Bronson Ing      Initial Encounter Date:    CARDIAC REHAB PHASE II ORIENTATION from 08/11/2019 in [place]  Date  08/11/19      Visit Diagnosis: S/P CABG x 3  Patient's Home Medications on Admission:  Current Outpatient Medications:  .  acetaminophen (TYLENOL) 500 MG tablet, Take 1,000 mg by mouth every 6 (six) hours as needed for moderate pain or headache., Disp: , Rfl:  .  aspirin EC 81 MG tablet, Take 81 mg by mouth daily., Disp: , Rfl:  .  ezetimibe (ZETIA) 10 MG tablet, Take 1 tablet (10 mg total) by mouth daily., Disp: 30 tablet, Rfl: 5 .  loperamide (IMODIUM A-D) 2 MG tablet, Take 2 mg by mouth 4 (four) times daily as needed for diarrhea or loose stools., Disp: , Rfl:  .  metoprolol tartrate (LOPRESSOR) 25 MG tablet, Take 0.5 tablets (12.5 mg total) by mouth 2 (two) times daily., Disp: 90 tablet, Rfl: 3 .  Omega-3 Fatty Acids (FISH OIL) 1000 MG CAPS, Take 1 capsule by mouth 2 (two) times daily., Disp: , Rfl:  .  promethazine (PHENERGAN) 12.5 MG tablet, Take 12.5 mg by mouth every 6 (six) hours as needed for nausea or vomiting., Disp: , Rfl:  .  RABEprazole (ACIPHEX) 20 MG tablet, Take 20 mg by mouth daily before breakfast., Disp: , Rfl:  .  tamsulosin (FLOMAX) 0.4 MG CAPS capsule, TAKE 2 CAPSULES BY MOUTH ONCE DAILY AFTER SUPPER FOR PROSTATE, Disp: 180 capsule, Rfl: 0  Past Medical History: Past Medical History:  Diagnosis Date  . Abnormal EKG    History per report of ST elevations x 20 years, no cardiac history-anomale with aorta-sts "it always looks like i am having a heart atttack on my EKG  . Arthritis    R wrist, R neck, L wrist, multiple joints- OA- CBD oil treats this problem    . Cancer [facility])     pathology is pending, plan for freezing area - L thigh- 03/17/2019  . COPD (chronic obstructive pulmonary disease) ([place])    patient denies  . Coronary artery disease    a. s/p CABG in 02/2019 with LIMA-LAD, SVG-D1, SVG-OM and SVG-RCA  . Dyspnea   . Enlarged prostate   . GERD (gastroesophageal reflux disease)   . Headache(784.0)    last one 5 yrs ago  . Sternal pain   . Vision problems    Blind x 20 years, regained site 2000, ? optic nerve injury    Tobacco Use: Social History   Tobacco Use  Smoking Status Former Smoker  . Packs/day: 0.50  . Years: 50.00  . Pack years: 25.00  . Types: Cigarettes  . Quit date: 02/14/2014  . Years since quitting: 5.6  Smokeless Tobacco Never Used  Tobacco Comment   Quit x 1 year    Labs: Recent Review Flowsheet Data    Labs for ITP Cardiac and Pulmonary Rehab Latest Ref Rng & Units 03/01/2019 03/01/2019 03/01/2019 03/02/2019 08/17/2019   Cholestrol <200 mg/dL - - - - 144   LDLCALC mg/dL (calc) - - - - 92   HDL > OR = 40 mg/dL - - - - 33(L)   Trlycerides <150 mg/dL - - - - 101   Hemoglobin A1c <5.7 %  of total Hgb - - - - 6.0(H)   PHART 7.350 - 7.450 7.356 7.370 7.371 7.415 -   PCO2ART 32.0 - 48.0 mmHg 42.5 38.7 36.2 36.1 -   HCO3 20.0 - 28.0 mmol/L 24.0 22.4 20.9 23.1 -   TCO2 22 - 32 mmol/L 25 24 22 24  -   ACIDBASEDEF 0.0 - 2.0 mmol/L 2.0 3.0(H) 4.0(H) 1.0 -   O2SAT % 95.0 94.0 90.0 94.0 -      Capillary Blood Glucose: Lab Results  Component Value Date   GLUCAP 138 (H) 03/03/2019   GLUCAP 102 (H) 03/03/2019   GLUCAP 168 (H) 03/03/2019   GLUCAP 138 (H) 03/03/2019   GLUCAP 107 (H) 03/02/2019     Exercise Target Goals: Exercise Program Goal: Individual exercise prescription set using results from initial 6 min walk test and THRR while considering  patient's activity barriers and safety.   Exercise Prescription Goal: Starting with aerobic activity 30 plus minutes a day, 3 days per week for initial exercise prescription. Provide home  exercise prescription and guidelines that participant acknowledges understanding prior to discharge.  Activity Barriers & Risk Stratification: Activity Barriers & Cardiac Risk Stratification - 08/11/19 1016      Activity Barriers & Cardiac Risk Stratification   Activity Barriers  Balance Concerns   Possible knee giving away at times   Cardiac Risk Stratification  High       6 Minute Walk: 6 Minute Walk    Row Name 08/11/19 0959         6 Minute Walk   Phase  Initial     Distance  1200 feet     Walk Time  6 minutes     # of Rest Breaks  0     MPH  2.27     METS  2.74     RPE  11     Perceived Dyspnea   9     Symptoms  No     Resting HR  60 bpm     Resting BP  110/78     Resting Oxygen Saturation   92 %     Exercise Oxygen Saturation  during 6 min walk  92 %     Max Ex. HR  86 bpm     Max Ex. BP  140/70     2 Minute Post BP  110/60        Oxygen Initial Assessment:   Oxygen Re-Evaluation:   Oxygen Discharge (Final Oxygen Re-Evaluation):   Initial Exercise Prescription: Initial Exercise Prescription - 08/11/19 1000      Date of Initial Exercise RX and Referring Provider   Date  08/11/19    Referring Provider  [facility]    Expected Discharge Date  10/11/19      Treadmill   MPH  1.5    Grade  0    Minutes  17    METs  2.14      T5 Nustep   Level  1    SPM  91    Minutes  22    METs  1.7      Prescription Details   Frequency (times per week)  3    Duration  Progress to 30 minutes of continuous aerobic without signs/symptoms of physical distress      Intensity   THRR 40-80% of Max Heartrate  93-110-126    Ratings of Perceived Exertion  11-13    Perceived Dyspnea  0-4      Progression  Progression  Continue to progress workloads to maintain intensity without signs/symptoms of physical distress.      Resistance Training   Training Prescription  Yes    Weight  1    Reps  10-15       Perform Capillary Blood Glucose checks as  needed.  Exercise Prescription Changes:  Exercise Prescription Changes    Row Name 08/24/19 1700             Response to Exercise   Blood Pressure (Admit)  110/72       Blood Pressure (Exercise)  108/60       Blood Pressure (Exit)  98/60       Heart Rate (Admit)  80 bpm       Heart Rate (Exercise)  101 bpm       Heart Rate (Exit)  87 bpm       Symptoms  none       Comments  doing well       Duration  Continue with 30 min of aerobic exercise without signs/symptoms of physical distress.       Intensity  THRR unchanged         Progression   Progression  Continue to progress workloads to maintain intensity without signs/symptoms of physical distress.       Average METs  2.37         Resistance Training   Training Prescription  Yes       Weight  2       Reps  10-15         Treadmill   MPH  1.8       Grade  0       Minutes  17       METs  2.37         T5 Nustep   Level  2       SPM  97       Minutes  22       METs  2         Home Exercise Plan   Plans to continue exercise at  Home (comment)       Frequency  Add 2 additional days to program exercise sessions.       Initial Home Exercises Provided  08/11/19          Exercise Comments:  Exercise Comments    Row Name 08/24/19 1721           Exercise Comments  Jimi is doing very well in the program. He has only been coming 2 weeks. In these 2 weeks he has shown that he is ready to be progressed to 2lbs weights, to have his mph increased on the TM and increase the level on the Nustep. As we progress him every 2 weeks he should be able to notice his stamina increasing which is apart of his goals.          Exercise Goals and Review:  Exercise Goals    Row Name 08/11/19 1021             Exercise Goals   Increase Physical Activity  Yes       Expected Outcomes  Short Term: Attend rehab on a regular basis to increase amount of physical activity.;Long Term: Exercising regularly at least 3-5 days a week.        Increase Strength and Stamina  Yes       Intervention  Provide advice,  education, support and counseling about physical activity/exercise needs.;Develop an individualized exercise prescription for aerobic and resistive training based on initial evaluation findings, risk stratification, comorbidities and participant's personal goals.       Expected Outcomes  Short Term: Increase workloads from initial exercise prescription for resistance, speed, and METs.;Long Term: Improve cardiorespiratory fitness, muscular endurance and strength as measured by increased METs and functional capacity (6MWT)       Able to understand and use rate of perceived exertion (RPE) scale  Yes       Intervention  Provide education and explanation on how to use RPE scale       Expected Outcomes  Short Term: Able to use RPE daily in rehab to express subjective intensity level;Long Term:  Able to use RPE to guide intensity level when exercising independently       Knowledge and understanding of Target Heart Rate Range (THRR)  Yes       Expected Outcomes  Short Term: Able to state/look up THRR;Long Term: Able to use THRR to govern intensity when exercising independently       Able to check pulse independently  Yes       Intervention  Provide education and demonstration on how to check pulse in carotid and radial arteries.       Expected Outcomes  Short Term: Able to explain why pulse checking is important during independent exercise;Long Term: Able to check pulse independently and accurately       Understanding of Exercise Prescription  Yes       Intervention  Provide education, explanation, and written materials on patient's individual exercise prescription       Expected Outcomes  Short Term: Able to explain program exercise prescription;Long Term: Able to explain home exercise prescription to exercise independently          Exercise Goals Re-Evaluation : Exercise Goals Re-Evaluation    Row Name 08/24/19 1720              Exercise Goal Re-Evaluation   Exercise Goals Review  Increase Physical Activity;Increase Strength and Stamina Be able to walk a mile w/o falling down or becoming SOB       Comments  Kellis's main goals is to be able to walk a mile w/o falling down or geeting SOB. He aslo wants to be able to bring in the groceries w/o SOB. He would like to also see his stamina increase over time w/o SOB.       Expected Outcomes  To be able to achieve his goals before graduation           Discharge Exercise Prescription (Final Exercise Prescription Changes): Exercise Prescription Changes - 08/24/19 1700      Response to Exercise   Blood Pressure (Admit)  110/72    Blood Pressure (Exercise)  108/60    Blood Pressure (Exit)  98/60    Heart Rate (Admit)  80 bpm    Heart Rate (Exercise)  101 bpm    Heart Rate (Exit)  87 bpm    Symptoms  none    Comments  doing well    Duration  Continue with 30 min of aerobic exercise without signs/symptoms of physical distress.    Intensity  THRR unchanged      Progression   Progression  Continue to progress workloads to maintain intensity without signs/symptoms of physical distress.    Average METs  2.37      Resistance Training   Training Prescription  Yes  Weight  2    Reps  10-15      Treadmill   MPH  1.8    Grade  0    Minutes  17    METs  2.37      T5 Nustep   Level  2    SPM  97    Minutes  22    METs  2      Home Exercise Plan   Plans to continue exercise at  Home (comment)    Frequency  Add 2 additional days to program exercise sessions.    Initial Home Exercises Provided  08/11/19       Nutrition:  Target Goals: Understanding of nutrition guidelines, daily intake of sodium 1500mg , cholesterol 200mg , calories 30% from fat and 7% or less from saturated fats, daily to have 5 or more servings of fruits and vegetables.  Biometrics: Pre Biometrics - 08/11/19 1025      Pre Biometrics   Height  5\' 11"  (1.803 m)    Weight  84.7 kg    Waist  Circumference  37.5 inches    Hip Circumference  39.5 inches    Waist to Hip Ratio  0.95 %    BMI (Calculated)  26.05    Triceps Skinfold  10 mm    % Body Fat  24.1 %    Grip Strength  28.6 kg    Flexibility  8.8 in    Single Leg Stand  4.87 seconds        Nutrition Therapy Plan and Nutrition Goals: Nutrition Therapy & Goals - 08/25/19 1441      Personal Nutrition Goals   Comments  We have resumed our RD class and are currently working on scheduling patients. In the intermin, we are providing education through hand-oust. He scored a 67 on his medficts diet assessment. Will continue to monitor for progress.      Intervention Plan   Intervention  Nutrition handout(s) given to patient.    Expected Outcomes  Short Term Goal: Understand basic principles of dietary content, such as calories, fat, sodium, cholesterol and nutrients.       Nutrition Assessments: Nutrition Assessments - 08/11/19 1115      MEDFICTS Scores   Pre Score  67       Nutrition Goals Re-Evaluation:   Nutrition Goals Discharge (Final Nutrition Goals Re-Evaluation):   Psychosocial: Target Goals: Acknowledge presence or absence of significant depression and/or stress, maximize coping skills, provide positive support system. Participant is able to verbalize types and ability to use techniques and skills needed for reducing stress and depression.  Initial Review & Psychosocial Screening: Initial Psych Review & Screening - 08/11/19 1110      Initial Review   Current issues with  None Identified      Family Dynamics   Good Support System?  Yes      Barriers   Psychosocial barriers to participate in program  There are no identifiable barriers or psychosocial needs.      Screening Interventions   Interventions  Encouraged to exercise    Expected Outcomes  Short Term goal: Identification and review with participant of any Quality of Life or Depression concerns found by scoring the questionnaire.;Long Term  goal: The participant improves quality of Life and PHQ9 Scores as seen by post scores and/or verbalization of changes       Quality of Life Scores: Quality of Life - 08/11/19 1111      Quality of Life  Select  Quality of Life      Quality of Life Scores   Health/Function Pre  19.14 %    Socioeconomic Pre  24.64 %    Psych/Spiritual Pre  23.07 %    Family Pre  25.5 %    GLOBAL Pre  22 %      Scores of 19 and below usually indicate a poorer quality of life in these areas.  A difference of  2-3 points is a clinically meaningful difference.  A difference of 2-3 points in the total score of the Quality of Life Index has been associated with significant improvement in overall quality of life, self-image, physical symptoms, and general health in studies assessing change in quality of life.  PHQ-9: Recent Review Flowsheet Data    Depression screen [facility] 2/9 08/11/2019 04/21/2019 11/06/2018 10/19/2018 09/22/2018   Decreased Interest 1 2 0 0 0   Down, Depressed, Hopeless 0 1 0 0 0   PHQ - 2 Score 1 3 0 0 0   Altered sleeping 3 3 - - -   Tired, decreased energy 0 2 - - -   Change in appetite 1 0 - - -   Feeling bad or failure about yourself  0 0 - - -   Trouble concentrating 0 0 - - -   Moving slowly or fidgety/restless 0 0 - - -   Suicidal thoughts 0 0 - - -   PHQ-9 Score 5 8 - - -   Difficult doing work/chores Not difficult at all Not difficult at all - - -     Interpretation of Total Score  Total Score Depression Severity:  1-4 = Minimal depression, 5-9 = Mild depression, 10-14 = Moderate depression, 15-19 = Moderately severe depression, 20-27 = Severe depression   Psychosocial Evaluation and Intervention: Psychosocial Evaluation - 08/11/19 1111      Psychosocial Evaluation & Interventions   Interventions  Encouraged to exercise with the program and follow exercise prescription    Continue Psychosocial Services   No Follow up required       Psychosocial Re-Evaluation: Psychosocial  Re-Evaluation    Row Name 08/25/19 1446             Psychosocial Re-Evaluation   Current issues with  None Identified       Comments  Patient's initial QOL score was 22 and his PHQ-9 score was 5 with no psychosocial issues identified at orientation. Will continue to monitor for progress.       Expected Outcomes  Patient will have no psychosocial issues identified at discharge.       Interventions  Stress management education;Encouraged to attend Cardiac Rehabilitation for the exercise       Continue Psychosocial Services   No Follow up required          Psychosocial Discharge (Final Psychosocial Re-Evaluation): Psychosocial Re-Evaluation - 08/25/19 1446      Psychosocial Re-Evaluation   Current issues with  None Identified    Comments  Patient's initial QOL score was 22 and his PHQ-9 score was 5 with no psychosocial issues identified at orientation. Will continue to monitor for progress.    Expected Outcomes  Patient will have no psychosocial issues identified at discharge.    Interventions  Stress management education;Encouraged to attend Cardiac Rehabilitation for the exercise    Continue Psychosocial Services   No Follow up required       Vocational Rehabilitation: Provide vocational rehab assistance to qualifying candidates.   Vocational  Rehab Evaluation & Intervention: Vocational Rehab - 08/11/19 1117      Initial Vocational Rehab Evaluation & Intervention   Assessment shows need for Vocational Rehabilitation  No       Education: Education Goals: Education classes will be provided on a weekly basis, covering required topics. Participant will state understanding/return demonstration of topics presented.  Learning Barriers/Preferences: Learning Barriers/Preferences - 08/11/19 1115      Learning Barriers/Preferences   Learning Barriers  None    Learning Preferences  Audio;Computer/Internet;Group Instruction;Individual Instruction;Pictoral;Skilled Demonstration;Verbal  Instruction;Video;Written Material       Education Topics: Hypertension, Hypertension Reduction -Define heart disease and high blood pressure. Discus how high blood pressure affects the body and ways to reduce high blood pressure.   Exercise and Your Heart -Discuss why it is important to exercise, the FITT principles of exercise, normal and abnormal responses to exercise, and how to exercise safely.   Angina -Discuss definition of angina, causes of angina, treatment of angina, and how to decrease risk of having angina.   Cardiac Medications -Review what the following cardiac medications are used for, how they affect the body, and side effects that may occur when taking the medications.  Medications include Aspirin, Beta blockers, calcium channel blockers, ACE Inhibitors, angiotensin receptor blockers, diuretics, digoxin, and antihyperlipidemics.   Congestive Heart Failure -Discuss the definition of CHF, how to live with CHF, the signs and symptoms of CHF, and how keep track of weight and sodium intake.   Heart Disease and Intimacy -Discus the effect sexual activity has on the heart, how changes occur during intimacy as we age, and safety during sexual activity.   Smoking Cessation / COPD -Discuss different methods to quit smoking, the health benefits of quitting smoking, and the definition of COPD.   Nutrition I: Fats -Discuss the types of cholesterol, what cholesterol does to the heart, and how cholesterol levels can be controlled.   Nutrition II: Labels -Discuss the different components of food labels and how to read food label   [place] from 09/15/2019 in [place]  Date  08/18/19  Educator  DC  Instruction Review Code  2- Demonstrated Understanding      Heart Parts/Heart Disease and PAD -Discuss the anatomy of the heart, the pathway of blood circulation through the heart, and these are affected by heart  disease.   Stress I: Signs and Symptoms -Discuss the causes of stress, how stress may lead to anxiety and depression, and ways to limit stress.   Stress II: Relaxation -Discuss different types of relaxation techniques to limit stress.   Warning Signs of Stroke / TIA -Discuss definition of a stroke, what the signs and symptoms are of a stroke, and how to identify when someone is having stroke.   CARDIAC REHAB PHASE II EXERCISE from 09/15/2019 in [place]  Date  09/15/19  Educator  DJ  Instruction Review Code  1- Verbalizes Understanding      Knowledge Questionnaire Score: Knowledge Questionnaire Score - 08/11/19 1117      Knowledge Questionnaire Score   Pre Score  18/24       Core Components/Risk Factors/Patient Goals at Admission: Personal Goals and Risk Factors at Admission - 08/11/19 1117      Core Components/Risk Factors/Patient Goals on Admission    Weight Management  Weight Maintenance    Personal Goal Other  Yes    Personal Goal  Walk 1 mile without falling down or SOB, Increase stamina, be  able to work normally w/o SOB    Intervention  Attend CR 3xweek and supplement with at home exercise plan 2 xweek    Expected Outcomes  Reach goals.       Core Components/Risk Factors/Patient Goals Review:  Goals and Risk Factor Review    Row Name 08/25/19 1444             Core Components/Risk Factors/Patient Goals Review   Personal Goals Review  Weight Management/Obesity;Other Increase stamina. Work normal w/o SOB; bring in groceries; walk 1 mile w/o SOB.       Review  Patient is new to the program completing 5 sessions. He has gained 2.6  lbs since his initial visit. He is working toward meeting his personal goals. Will continue to monitor for progress.       Expected Outcomes  Patient will continue to attend sessions and complete the program meeting his personal goals.          Core Components/Risk Factors/Patient Goals at Discharge (Final  Review):  Goals and Risk Factor Review - 08/25/19 1444      Core Components/Risk Factors/Patient Goals Review   Personal Goals Review  Weight Management/Obesity;Other   Increase stamina. Work normal w/o SOB; bring in groceries; walk 1 mile w/o SOB.   Review  Patient is new to the program completing 5 sessions. He has gained 2.6  lbs since his initial visit. He is working toward meeting his personal goals. Will continue to monitor for progress.    Expected Outcomes  Patient will continue to attend sessions and complete the program meeting his personal goals.       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 He is not all that excited about exercising but does see the importance of the program and how it will benefit him.  Cardiac rehab has been suspended due to staff reassignments for COVID surge. Patient wants to return to the program when we reopen to complete the program. He has attended 7 sessions. Will f/u when we reopen.  Patient stopped attending on 09/15/19 due to program closing. He declined to return to the program when we reopened. He completed 7 sessions. MD will be notified.        Comments: Patient stopped coming to Cardiac Rehab on 09/15/19 when the program was closed. He declined to return to the program when we reopened. He completed 7 sessions. Doctor will be informed.

## 2019-10-21 NOTE — Addendum Note (Signed)
Encounter addended by: Dwana Melena, RN on: 10/21/2019 12:52 PM  Actions taken: Flowsheet accepted, Clinical Note Signed, Episode resolved

## 2019-10-22 ENCOUNTER — Encounter (HOSPITAL_COMMUNITY): Payer: Medicare Other

## 2019-10-25 ENCOUNTER — Encounter (HOSPITAL_COMMUNITY): Payer: Medicare Other

## 2019-10-26 ENCOUNTER — Ambulatory Visit
Admission: RE | Admit: 2019-10-26 | Discharge: 2019-10-26 | Disposition: A | Discharge status: Home / Self Care | Payer: Medicare Other | Source: Ambulatory Visit | Attending: Sports Medicine | Admitting: Sports Medicine

## 2019-10-26 ENCOUNTER — Other Ambulatory Visit: Payer: Self-pay | Admitting: Sports Medicine

## 2019-10-26 DIAGNOSIS — R350 Frequency of micturition: Secondary | ICD-10-CM | POA: Diagnosis not present

## 2019-10-26 DIAGNOSIS — R0789 Other chest pain: Secondary | ICD-10-CM

## 2019-10-26 DIAGNOSIS — M5030 Other cervical disc degeneration, unspecified cervical region: Secondary | ICD-10-CM | POA: Diagnosis not present

## 2019-10-26 DIAGNOSIS — R079 Chest pain, unspecified: Secondary | ICD-10-CM | POA: Diagnosis not present

## 2019-10-26 DIAGNOSIS — S42024D Nondisplaced fracture of shaft of right clavicle, subsequent encounter for fracture with routine healing: Secondary | ICD-10-CM | POA: Diagnosis not present

## 2019-10-27 ENCOUNTER — Encounter (HOSPITAL_COMMUNITY): Payer: Medicare Other

## 2019-10-27 MED ORDER — REPATHA SURECLICK 140 MG/ML ~~LOC~~ SOAJ: 1.0000 | SUBCUTANEOUS | 11 refills | Status: DC

## 2019-10-27 NOTE — Addendum Note (Signed)
Addended by: Fidel Levy on: 10/27/2019 02:20 PM   Modules accepted: Orders

## 2019-10-27 NOTE — Telephone Encounter (Signed)
PA approved until 10/25/2020

## 2019-10-27 NOTE — Telephone Encounter (Signed)
Patient notified Rep[facility] has been approved. Rx sent to [facility] he was advised to apply for [employer] grant if he cannot afford co-pay

## 2019-10-29 ENCOUNTER — Encounter (HOSPITAL_COMMUNITY): Payer: Medicare Other

## 2019-11-01 ENCOUNTER — Encounter (HOSPITAL_COMMUNITY): Payer: Medicare Other

## 2019-11-03 ENCOUNTER — Encounter (HOSPITAL_COMMUNITY): Payer: Medicare Other

## 2019-11-03 DIAGNOSIS — M542 Cervicalgia: Secondary | ICD-10-CM | POA: Diagnosis not present

## 2019-11-04 DIAGNOSIS — M5030 Other cervical disc degeneration, unspecified cervical region: Secondary | ICD-10-CM | POA: Diagnosis not present

## 2019-11-05 ENCOUNTER — Encounter (HOSPITAL_COMMUNITY): Payer: Medicare Other

## 2019-11-10 ENCOUNTER — Other Ambulatory Visit: Payer: Self-pay | Admitting: Internal Medicine

## 2019-11-10 DIAGNOSIS — M25511 Pain in right shoulder: Secondary | ICD-10-CM | POA: Diagnosis not present

## 2019-11-10 DIAGNOSIS — M5030 Other cervical disc degeneration, unspecified cervical region: Secondary | ICD-10-CM | POA: Diagnosis not present

## 2019-11-10 DIAGNOSIS — M542 Cervicalgia: Secondary | ICD-10-CM | POA: Diagnosis not present

## 2019-11-10 MED ORDER — REPATHA SURECLICK 140 MG/ML ~~LOC~~ SOAJ: 1.0000 | SUBCUTANEOUS | 11 refills | Status: DC

## 2019-11-10 NOTE — Telephone Encounter (Signed)
repatha refilled 

## 2019-11-19 DIAGNOSIS — M5030 Other cervical disc degeneration, unspecified cervical region: Secondary | ICD-10-CM | POA: Diagnosis not present

## 2019-11-19 DIAGNOSIS — M542 Cervicalgia: Secondary | ICD-10-CM | POA: Diagnosis not present

## 2019-11-21 ENCOUNTER — Ambulatory Visit: Payer: Medicare Other | Attending: Internal Medicine

## 2019-11-21 DIAGNOSIS — Z23 Encounter for immunization: Secondary | ICD-10-CM

## 2019-11-21 NOTE — Progress Notes (Signed)
   Covid-19 Vaccination Clinic  Name:  Benjamin [name]    MRN: [id number] DOB: 07/10/1941  11/21/2019  Benjamin [name] was observed post Covid-19 immunization for 30 minutes based on pre-vaccination screening without incident. He was provided with Vaccine Information Sheet and instruction to access the V-Safe system.   Benjamin [name] was instructed to call 911 with any severe reactions post vaccine: Marland Kitchen Difficulty breathing  . Swelling of face and throat  . A fast heartbeat  . A bad rash all over body  . Dizziness and weakness   Immunizations Administered    Name Date Dose VIS Date Route   Pfizer COVID-19 Vaccine 11/21/2019 10:16 AM 0.3 mL 08/27/2019 Intramuscular   Manufacturer: [place]   Lot: [id number]   [place]: [id number]

## 2019-12-02 DIAGNOSIS — M25511 Pain in right shoulder: Secondary | ICD-10-CM | POA: Diagnosis not present

## 2019-12-02 DIAGNOSIS — M542 Cervicalgia: Secondary | ICD-10-CM | POA: Diagnosis not present

## 2019-12-06 DIAGNOSIS — M19011 Primary osteoarthritis, right shoulder: Secondary | ICD-10-CM | POA: Diagnosis not present

## 2019-12-06 DIAGNOSIS — M25511 Pain in right shoulder: Secondary | ICD-10-CM | POA: Diagnosis not present

## 2019-12-09 ENCOUNTER — Encounter: Payer: Self-pay | Admitting: Physician Assistant

## 2019-12-09 ENCOUNTER — Ambulatory Visit (INDEPENDENT_AMBULATORY_CARE_PROVIDER_SITE_OTHER): Payer: Medicare Other | Admitting: Physician Assistant

## 2019-12-09 VITALS — BP 118/78 | HR 68 | Temp 97.4°F | Ht 71.0 in | Wt 187.4 lb

## 2019-12-09 DIAGNOSIS — I2510 Atherosclerotic heart disease of native coronary artery without angina pectoris: Secondary | ICD-10-CM | POA: Diagnosis not present

## 2019-12-09 DIAGNOSIS — E785 Hyperlipidemia, unspecified: Secondary | ICD-10-CM
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
## 2019-12-09 NOTE — Patient Instructions (Signed)
Medication Instructions:  Your physician recommends that you continue on your current medications as directed. Please refer to the Current Medication list given to you today.  Your physician discussed the importance of regular exercise and recommended that you start or continue a regular exercise program for good health. Walk daily and increase distance by 10% each week until you reach 2 miles.  Your target heart rate is 110 or less.   *If you need a refill on your cardiac medications before your next appointment, please call your pharmacy*   Lab Work: NONE   If you have labs (blood work) drawn today and your tests are completely normal, you will receive your results only by: Marland Kitchen MyChart Message (if you have MyChart) OR . A paper copy in the mail If you have any lab test that is abnormal or we need to change your treatment, we will call you to review the results.   Testing/Procedures: NONE    Follow-Up: At [facility], you and your health needs are our priority.  As part of our continuing mission to provide you with exceptional heart care, we have created designated Provider Care Teams.  These Care Teams include your primary Cardiologist (physician) and Advanced Practice Providers (APPs -  Physician Assistants and Nurse Practitioners) who all work together to provide you with the care you need, when you need it.  We recommend signing up for the patient portal called "MyChart".  Sign up information is provided on this After Visit Summary.  MyChart is used to connect with patients for Virtual Visits (Telemedicine).  Patients are able to view lab/test results, encounter notes, upcoming appointments, etc.  Non-urgent messages can be sent to your provider as well.   To learn more about what you can do with MyChart, go to [email or web address].    Your next appointment:   6 month(s)  The format for your next appointment:   In Person  Provider:   Kate Sable, MD   Other  Instructions Thank you for choosing [facility]!

## 2019-12-12 ENCOUNTER — Other Ambulatory Visit: Payer: Self-pay

## 2019-12-12 ENCOUNTER — Ambulatory Visit: Payer: Medicare Other | Attending: Internal Medicine

## 2019-12-12 DIAGNOSIS — Z23 Encounter for immunization: Secondary | ICD-10-CM

## 2019-12-12 NOTE — Progress Notes (Signed)
   Covid-19 Vaccination Clinic  Name:  Benjamin [name]    MRN: [id number] DOB: Jul 01, 1941  12/12/2019  [name] was observed post Covid-19 immunization for 15 minutes without incident. He was provided with Vaccine Information Sheet and instruction to access the V-Safe system.   [name] was instructed to call 911 with any severe reactions post vaccine: Marland Kitchen Difficulty breathing  . Swelling of face and throat  . A fast heartbeat  . A bad rash all over body  . Dizziness and weakness   Immunizations Administered    Name Date Dose VIS Date Route   Pfizer COVID-19 Vaccine 12/12/2019  9:29 AM 0.3 mL 08/27/2019 Intramuscular   Manufacturer: [place]   Lot: [id number]   [place]: [id number]

## 2019-12-16 ENCOUNTER — Other Ambulatory Visit: Payer: Medicare Other

## 2019-12-16 ENCOUNTER — Other Ambulatory Visit: Payer: Self-pay

## 2019-12-16 DIAGNOSIS — Z125 Encounter for screening for malignant neoplasm of prostate: Secondary | ICD-10-CM | POA: Diagnosis not present

## 2019-12-16 DIAGNOSIS — I2510 Atherosclerotic heart disease of native coronary artery without angina pectoris: Secondary | ICD-10-CM | POA: Diagnosis not present

## 2019-12-16 DIAGNOSIS — E119 Type 2 diabetes mellitus without complications: Secondary | ICD-10-CM | POA: Diagnosis not present

## 2019-12-16 DIAGNOSIS — E785 Hyperlipidemia, unspecified: Secondary | ICD-10-CM

## 2019-12-20 ENCOUNTER — Ambulatory Visit (INDEPENDENT_AMBULATORY_CARE_PROVIDER_SITE_OTHER): Payer: Medicare Other | Admitting: Family Medicine

## 2019-12-20 ENCOUNTER — Encounter: Payer: Self-pay | Admitting: Family Medicine

## 2019-12-20 ENCOUNTER — Other Ambulatory Visit: Payer: Self-pay

## 2019-12-20 VITALS — BP 112/62 | HR 68 | Temp 98.6°F | Resp 16 | Ht 71.0 in | Wt 183.0 lb

## 2019-12-20 DIAGNOSIS — N400 Enlarged prostate without lower urinary tract symptoms: Secondary | ICD-10-CM

## 2019-12-20 DIAGNOSIS — G894 Chronic pain syndrome: Secondary | ICD-10-CM | POA: Diagnosis not present

## 2019-12-20 DIAGNOSIS — I2510 Atherosclerotic heart disease of native coronary artery without angina pectoris: Secondary | ICD-10-CM

## 2019-12-20 DIAGNOSIS — J309 Allergic rhinitis, unspecified: Secondary | ICD-10-CM | POA: Diagnosis not present

## 2019-12-20 DIAGNOSIS — E119 Type 2 diabetes mellitus without complications: Secondary | ICD-10-CM

## 2019-12-20 DIAGNOSIS — F5104 Psychophysiologic insomnia: Secondary | ICD-10-CM | POA: Diagnosis not present

## 2019-12-20 MED ORDER — MONTELUKAST SODIUM 10 MG PO TABS: 10.0000 mg | ORAL_TABLET | Freq: Every day | ORAL | 3 refills | Status: DC

## 2019-12-20 MED ORDER — HYDROMORPHONE HCL 2 MG PO TABS: 2.0000 mg | ORAL_TABLET | Freq: Four times a day (QID) | ORAL | 0 refills | Status: DC | PRN

## 2019-12-20 NOTE — Progress Notes (Signed)
Subjective:    Patient ID: Benjamin [name], male    DOB: 04/17/1941, 79 y.o.   MRN: [id number]  Patient presents for Follow-up (has had labs) and Pain (S/P clavicle fx- pain located in shoulder- has been seen at ortho, injection placed, pred given- no relief)  Patient here to follow-up chronic medical problems.  Medications reviewed.  As well as his recent labs which were reviewed at the bedside.  He has multiple complaints today.   Diabetes mellitus currently diet controlled his most recent A1c was 6.1%   He is being followed by orthopedics secondary to fracture of his collarbone also chronic neck pain.  He has been given prednisone which helped minimally.  He is also had injections in his neck. - Dr. Thea Alken- Odis Luster last seen 2-3 week ago.  He is out of his pain medication Dilaudid he has been trying to take it once a day and requested a refill on this.  They referred him to [place] for that.  has seen Dr. Hal Morales as well  Dr. Ron Agee, given epidural in his neck    BPH he continues have difficulty getting his urine stream out despite taking Flomax 0.8 mg.  States that he has a staying there for quite some time.  However in the counterpart he has been taking antihistamines for his chronic rhinitis and does not want to stop taking the antihistamines despite it interfering with his urine stream.  Of note he denies any fever cough congestion associated with the allergy symptoms     He also mentioned difficulty sleeping which is not new.  He took melatonin 1 night and it did not help.  He has difficulty falling asleep but did state that sometimes he takes a nap during the daytime and still feels fatigued.  Pain also interferes with his sleep.  Review Of Systems:  GEN- denies fatigue, fever, weight loss,weakness, recent illness HEENT- denies eye drainage, change in vision,+ nasal discharge, CVS- denies chest pain, palpitations RESP- denies SOB, cough, wheeze ABD- denies N/V, change in stools,  abd pain GU- denies dysuria, hematuria, dribbling, incontinence MSK-+ joint pain, muscle aches, injury Neuro- denies headache, dizziness, syncope, seizure activity       Objective:    BP 112/62   Pulse 68   Temp 98.6 F (37 C) (Temporal)   Resp 16   Ht 5\' 11"  (1.803 m)   Wt 183 lb (83 kg)   SpO2 96%   BMI 25.52 kg/m  GEN- NAD, alert and oriented x3 HEENT- PERRL, EOMI, non injected sclera, pink conjunctiva, MMM, oropharynx clear, nares clear rhinorrhea no sinus tenderness Neck- Supple, no thyromegaly CVS- RRR, no murmur RESP-CTAB ABD-NABS,soft,NT,ND EXT- No edema Pulses- Radial2+        Assessment & Plan:      Problem List Items Addressed This Visit      Unprioritized   BPH (benign prostatic hyperplasia) - Primary    Discussed taking him off of the antihistamine but he feels he has better quality of life trying to treat his allergies.  We will try him on Singulair to see if there is less effect on the prostate.  With his other medications for his coronary artery disease I do not want to add an alpha-blocker at this time      CAD (coronary artery disease)    He is followed by cardiology.  Recent labs reviewed.  He is on Repatha injection for his cholesterol this will be done with cardiology at his next visit.  Chronic allergic rhinitis    Trial of Singulair and stop the Allegra he declines nasal steroids or any other nasal spray      Chronic insomnia    Trial of melatonin 5 mg at bedtime for at least 2 weeks.  Pain control per above      Chronic pain syndrome    Is had chronic pain issues the past few months with worsening neck pain back pain with also status post CABG and now with a broken collarbone.  I refill the Dilaudid he is not to take more than 2 on a severe day.      Relevant Medications   HYDROmorphone (DILAUDID) 2 MG tablet   Type 2 diabetes mellitus without complications ([place])    Diabetes is diet controlled.  He does have a sweet tooth and  does not eat many veggies but does not want to really change these dietary habits.         Note: This dictation was prepared with Dragon dictation along with smaller phrase technology. Any transcriptional errors that result from this process are unintentional.

## 2019-12-20 NOTE — Patient Instructions (Addendum)
Try singulair Melatonin 5mg  at bedtime for sleep for 2 weeks  Continue the protein shake  Labs look good Pain medication refilled  F/U 4 MONTHS 30 minute slot

## 2019-12-21 ENCOUNTER — Encounter: Payer: Self-pay | Admitting: Family Medicine

## 2019-12-21 DIAGNOSIS — J309 Allergic rhinitis, unspecified: Secondary | ICD-10-CM | POA: Insufficient documentation

## 2019-12-21 DIAGNOSIS — G894 Chronic pain syndrome: Secondary | ICD-10-CM | POA: Insufficient documentation

## 2019-12-21 LAB — CBC WITH DIFFERENTIAL/PLATELET
Absolute Monocytes: 701 cells/uL (ref 200–950)
Basophils Absolute: 36 cells/uL (ref 0–200)
Basophils Relative: 0.4 %
Eosinophils Absolute: 46 cells/uL (ref 15–500)
Eosinophils Relative: 0.5 %
HCT: 43.1 % (ref 38.5–50.0)
Hemoglobin: 14.7 g/dL (ref 13.2–17.1)
Lymphs Abs: 1911 cells/uL (ref 850–3900)
MCH: 31.7 pg (ref 27.0–33.0)
MCHC: 34.1 g/dL (ref 32.0–36.0)
MCV: 92.9 fL (ref 80.0–100.0)
MPV: 9.5 fL (ref 7.5–12.5)
Monocytes Relative: 7.7 %
Neutro Abs: 6406 cells/uL (ref 1500–7800)
Neutrophils Relative %: 70.4 %
Platelets: 325 10*3/uL (ref 140–400)
RBC: 4.64 10*6/uL (ref 4.20–5.80)
RDW: 14.1 % (ref 11.0–15.0)
Total Lymphocyte: 21 %
WBC: 9.1 10*3/uL (ref 3.8–10.8)

## 2019-12-21 LAB — COMPREHENSIVE METABOLIC PANEL
AG Ratio: 1.7 (calc) (ref 1.0–2.5)
ALT: 22 U/L (ref 9–46)
AST: 17 U/L (ref 10–35)
Albumin: 4 g/dL (ref 3.6–5.1)
Alkaline phosphatase (APISO): 45 U/L (ref 35–144)
BUN: 22 mg/dL (ref 7–25)
CO2: 26 mmol/L (ref 20–32)
Calcium: 9.5 mg/dL (ref 8.6–10.3)
Chloride: 106 mmol/L (ref 98–110)
Creat: 1.14 mg/dL (ref 0.70–1.18)
Globulin: 2.3 g/dL (calc) (ref 1.9–3.7)
Glucose, Bld: 86 mg/dL (ref 65–99)
Potassium: 4.8 mmol/L (ref 3.5–5.3)
Sodium: 143 mmol/L (ref 135–146)
Total Bilirubin: 1 mg/dL (ref 0.2–1.2)
Total Protein: 6.3 g/dL (ref 6.1–8.1)

## 2019-12-21 LAB — TEST AUTHORIZATION

## 2019-12-21 LAB — PSA: PSA: 2.4 ng/mL (ref <=4.0)

## 2019-12-21 LAB — HEMOGLOBIN A1C
Hgb A1c MFr Bld: 6.1 % of total Hgb — ABNORMAL HIGH (ref <5.7)
Mean Plasma Glucose: 128 (calc)
eAG (mmol/L): 7.1 (calc)

## 2019-12-21 NOTE — Assessment & Plan Note (Signed)
Is had chronic pain issues the past few months with worsening neck pain back pain with also status post CABG and now with a broken collarbone.  I refill the Dilaudid he is not to take more than 2 on a severe day.

## 2019-12-21 NOTE — Assessment & Plan Note (Signed)
He is followed by cardiology.  Recent labs reviewed.  He is on Repatha injection for his cholesterol this will be done with cardiology at his next visit.

## 2019-12-21 NOTE — Assessment & Plan Note (Signed)
Trial of Singulair and stop the Allegra he declines nasal steroids or any other nasal spray

## 2019-12-21 NOTE — Assessment & Plan Note (Signed)
Trial of melatonin 5 mg at bedtime for at least 2 weeks.  Pain control per above

## 2019-12-21 NOTE — Assessment & Plan Note (Signed)
Diabetes is diet controlled.  He does have a sweet tooth and does not eat many veggies but does not want to really change these dietary habits.

## 2019-12-21 NOTE — Assessment & Plan Note (Addendum)
Discussed taking him off of the antihistamine but he feels he has better quality of life trying to treat his allergies.  We will try him on Singulair to see if there is less effect on the prostate.  With his other medications for his coronary artery disease I do not want to add an alpha-blocker at this time

## 2019-12-23 ENCOUNTER — Encounter: Payer: Self-pay | Admitting: *Deleted

## 2020-01-03 DIAGNOSIS — M19011 Primary osteoarthritis, right shoulder: Secondary | ICD-10-CM | POA: Diagnosis not present

## 2020-01-08 ENCOUNTER — Other Ambulatory Visit: Payer: Self-pay | Admitting: Student

## 2020-01-08 ENCOUNTER — Other Ambulatory Visit: Payer: Self-pay | Admitting: Family Medicine

## 2020-01-19 ENCOUNTER — Telehealth: Payer: Self-pay | Admitting: Family Medicine

## 2020-01-19 NOTE — Telephone Encounter (Signed)
Pt's wife called and states that he has developed sob with exertion. Pt denies any CP, Fever, Nausea or Vomiting. She does not want him to go to the ER. Informed her to take him to ER if he developed sob with any other symptoms mentioned above. She verbalized understanding and apt made for tomorrow with Dr. Dennard Schaumann

## 2020-01-20 ENCOUNTER — Encounter: Payer: Self-pay | Admitting: Family Medicine

## 2020-01-20 ENCOUNTER — Ambulatory Visit (INDEPENDENT_AMBULATORY_CARE_PROVIDER_SITE_OTHER): Payer: Medicare Other | Admitting: Family Medicine

## 2020-01-20 ENCOUNTER — Encounter (HOSPITAL_COMMUNITY): Payer: Self-pay | Admitting: Emergency Medicine

## 2020-01-20 ENCOUNTER — Inpatient Hospital Stay (HOSPITAL_COMMUNITY)
Admission: EM | Admit: 2020-01-20 | Discharge: 2020-01-25 | DRG: 193 | Disposition: A | Discharge status: Home Health | Payer: Medicare Other | Attending: Internal Medicine | Admitting: Internal Medicine

## 2020-01-20 ENCOUNTER — Other Ambulatory Visit: Payer: Self-pay

## 2020-01-20 ENCOUNTER — Emergency Department (HOSPITAL_COMMUNITY): Payer: Medicare Other

## 2020-01-20 VITALS — BP 88/60 | HR 88 | Temp 96.8°F | Resp 20 | Wt 190.0 lb

## 2020-01-20 DIAGNOSIS — J432 Centrilobular emphysema: Secondary | ICD-10-CM | POA: Diagnosis not present

## 2020-01-20 DIAGNOSIS — J181 Lobar pneumonia, unspecified organism: Secondary | ICD-10-CM

## 2020-01-20 DIAGNOSIS — Z833 Family history of diabetes mellitus: Secondary | ICD-10-CM

## 2020-01-20 DIAGNOSIS — I5032 Chronic diastolic (congestive) heart failure: Secondary | ICD-10-CM | POA: Diagnosis present

## 2020-01-20 DIAGNOSIS — Z85828 Personal history of other malignant neoplasm of skin: Secondary | ICD-10-CM | POA: Diagnosis not present

## 2020-01-20 DIAGNOSIS — R0602 Shortness of breath: Secondary | ICD-10-CM | POA: Diagnosis not present

## 2020-01-20 DIAGNOSIS — Z825 Family history of asthma and other chronic lower respiratory diseases: Secondary | ICD-10-CM | POA: Diagnosis not present

## 2020-01-20 DIAGNOSIS — R7303 Prediabetes: Secondary | ICD-10-CM

## 2020-01-20 DIAGNOSIS — D6959 Other secondary thrombocytopenia: Secondary | ICD-10-CM | POA: Diagnosis not present

## 2020-01-20 DIAGNOSIS — D696 Thrombocytopenia, unspecified: Secondary | ICD-10-CM

## 2020-01-20 DIAGNOSIS — K760 Fatty (change of) liver, not elsewhere classified: Secondary | ICD-10-CM | POA: Diagnosis present

## 2020-01-20 DIAGNOSIS — J189 Pneumonia, unspecified organism: Secondary | ICD-10-CM | POA: Diagnosis present

## 2020-01-20 DIAGNOSIS — E871 Hypo-osmolality and hyponatremia: Secondary | ICD-10-CM | POA: Diagnosis present

## 2020-01-20 DIAGNOSIS — N400 Enlarged prostate without lower urinary tract symptoms: Secondary | ICD-10-CM | POA: Diagnosis present

## 2020-01-20 DIAGNOSIS — E119 Type 2 diabetes mellitus without complications: Secondary | ICD-10-CM | POA: Diagnosis present

## 2020-01-20 DIAGNOSIS — G894 Chronic pain syndrome: Secondary | ICD-10-CM | POA: Diagnosis present

## 2020-01-20 DIAGNOSIS — J440 Chronic obstructive pulmonary disease with acute lower respiratory infection: Secondary | ICD-10-CM | POA: Diagnosis present

## 2020-01-20 DIAGNOSIS — R0902 Hypoxemia: Secondary | ICD-10-CM

## 2020-01-20 DIAGNOSIS — J9811 Atelectasis: Secondary | ICD-10-CM | POA: Diagnosis present

## 2020-01-20 DIAGNOSIS — I2510 Atherosclerotic heart disease of native coronary artery without angina pectoris: Secondary | ICD-10-CM | POA: Diagnosis present

## 2020-01-20 DIAGNOSIS — J4489 Other specified chronic obstructive pulmonary disease: Secondary | ICD-10-CM | POA: Diagnosis present

## 2020-01-20 DIAGNOSIS — D72819 Decreased white blood cell count, unspecified: Secondary | ICD-10-CM | POA: Diagnosis not present

## 2020-01-20 DIAGNOSIS — Z8249 Family history of ischemic heart disease and other diseases of the circulatory system: Secondary | ICD-10-CM

## 2020-01-20 DIAGNOSIS — Z7982 Long term (current) use of aspirin: Secondary | ICD-10-CM

## 2020-01-20 DIAGNOSIS — K2100 Gastro-esophageal reflux disease with esophagitis, without bleeding: Secondary | ICD-10-CM | POA: Diagnosis present

## 2020-01-20 DIAGNOSIS — D708 Other neutropenia: Secondary | ICD-10-CM | POA: Diagnosis not present

## 2020-01-20 DIAGNOSIS — I959 Hypotension, unspecified: Secondary | ICD-10-CM | POA: Diagnosis present

## 2020-01-20 DIAGNOSIS — K7689 Other specified diseases of liver: Secondary | ICD-10-CM | POA: Diagnosis not present

## 2020-01-20 DIAGNOSIS — J9601 Acute respiratory failure with hypoxia: Secondary | ICD-10-CM

## 2020-01-20 DIAGNOSIS — Z87891 Personal history of nicotine dependence: Secondary | ICD-10-CM | POA: Diagnosis not present

## 2020-01-20 DIAGNOSIS — D709 Neutropenia, unspecified: Secondary | ICD-10-CM | POA: Diagnosis not present

## 2020-01-20 DIAGNOSIS — Z20822 Contact with and (suspected) exposure to covid-19: Secondary | ICD-10-CM | POA: Diagnosis present

## 2020-01-20 DIAGNOSIS — E782 Mixed hyperlipidemia: Secondary | ICD-10-CM | POA: Diagnosis present

## 2020-01-20 DIAGNOSIS — J449 Chronic obstructive pulmonary disease, unspecified: Secondary | ICD-10-CM | POA: Diagnosis present

## 2020-01-20 DIAGNOSIS — E785 Hyperlipidemia, unspecified: Secondary | ICD-10-CM | POA: Diagnosis not present

## 2020-01-20 DIAGNOSIS — R7401 Elevation of levels of liver transaminase levels: Secondary | ICD-10-CM | POA: Diagnosis not present

## 2020-01-20 DIAGNOSIS — I25119 Atherosclerotic heart disease of native coronary artery with unspecified angina pectoris: Secondary | ICD-10-CM | POA: Diagnosis not present

## 2020-01-20 DIAGNOSIS — Z79899 Other long term (current) drug therapy: Secondary | ICD-10-CM

## 2020-01-20 DIAGNOSIS — J441 Chronic obstructive pulmonary disease with (acute) exacerbation: Secondary | ICD-10-CM | POA: Diagnosis not present

## 2020-01-20 DIAGNOSIS — Z951 Presence of aortocoronary bypass graft: Secondary | ICD-10-CM

## 2020-01-20 HISTORY — DX: Unspecified malignant neoplasm of skin, unspecified: C44.90

## 2020-01-20 LAB — CBC WITH DIFFERENTIAL/PLATELET
Abs Immature Granulocytes: 0.01 10*3/uL (ref 0.00–0.07)
Basophils Absolute: 0 10*3/uL (ref 0.0–0.1)
Basophils Relative: 1 %
Eosinophils Absolute: 0 10*3/uL (ref 0.0–0.5)
Eosinophils Relative: 0 %
HCT: 43.9 % (ref 39.0–52.0)
Hemoglobin: 14.6 g/dL (ref 13.0–17.0)
Immature Granulocytes: 0 %
Lymphocytes Relative: 17 %
Lymphs Abs: 0.5 10*3/uL — ABNORMAL LOW (ref 0.7–4.0)
MCH: 31.3 pg (ref 26.0–34.0)
MCHC: 33.3 g/dL (ref 30.0–36.0)
MCV: 94.2 fL (ref 80.0–100.0)
Monocytes Absolute: 0.5 10*3/uL (ref 0.1–1.0)
Monocytes Relative: 17 %
Neutro Abs: 1.7 10*3/uL (ref 1.7–7.7)
Neutrophils Relative %: 65 %
Platelets: 210 10*3/uL (ref 150–400)
RBC: 4.66 MIL/uL (ref 4.22–5.81)
RDW: 14.3 % (ref 11.5–15.5)
WBC: 2.7 10*3/uL — ABNORMAL LOW (ref 4.0–10.5)
nRBC: 0 % (ref 0.0–0.2)

## 2020-01-20 LAB — BASIC METABOLIC PANEL
Anion gap: 7 (ref 5–15)
BUN: 17 mg/dL (ref 8–23)
CO2: 22 mmol/L (ref 22–32)
Calcium: 8.2 mg/dL — ABNORMAL LOW (ref 8.9–10.3)
Chloride: 104 mmol/L (ref 98–111)
Creatinine, Ser: 1.02 mg/dL (ref 0.61–1.24)
GFR calc Af Amer: >60 mL/min (ref >60)
GFR calc non Af Amer: >60 mL/min (ref >60)
Glucose, Bld: 105 mg/dL — ABNORMAL HIGH (ref 70–99)
Potassium: 3.8 mmol/L (ref 3.5–5.1)
Sodium: 133 mmol/L — ABNORMAL LOW (ref 135–145)

## 2020-01-20 LAB — RESPIRATORY PANEL BY RT PCR (FLU A&B, COVID)
Influenza A by PCR: NEGATIVE
Influenza B by PCR: NEGATIVE
SARS Coronavirus 2 by RT PCR: NEGATIVE

## 2020-01-20 LAB — LACTIC ACID, PLASMA: Lactic Acid, Venous: 1.7 mmol/L (ref 0.5–1.9)

## 2020-01-20 LAB — BRAIN NATRIURETIC PEPTIDE: B Natriuretic Peptide: 79 pg/mL (ref 0.0–100.0)

## 2020-01-20 LAB — POC SARS CORONAVIRUS 2 AG -  ED: SARS Coronavirus 2 Ag: NEGATIVE

## 2020-01-20 LAB — TROPONIN I (HIGH SENSITIVITY)
Troponin I (High Sensitivity): 3 ng/L (ref <18)
Troponin I (High Sensitivity): 3 ng/L (ref <18)

## 2020-01-20 LAB — GLUCOSE, CAPILLARY: Glucose-Capillary: 105 mg/dL — ABNORMAL HIGH (ref 70–99)

## 2020-01-20 MED ORDER — POTASSIUM CHLORIDE IN NACL 20-0.9 MEQ/L-% IV SOLN: INTRAVENOUS | Status: AC

## 2020-01-20 MED ORDER — INSULIN ASPART 100 UNIT/ML ~~LOC~~ SOLN: 0.0000 [IU] | Freq: Three times a day (TID) | SUBCUTANEOUS | Status: DC

## 2020-01-20 MED ORDER — SODIUM CHLORIDE 0.9 % IV SOLN
1.0000 g | Freq: Once | INTRAVENOUS | Status: AC
  Administered 2020-01-20: 1 g via INTRAVENOUS
  Filled 2020-01-20: qty 10

## 2020-01-20 MED ORDER — SODIUM CHLORIDE 0.9 % IV SOLN
500.0000 mg | Freq: Once | INTRAVENOUS | Status: AC
  Administered 2020-01-20: 500 mg via INTRAVENOUS
  Filled 2020-01-20: qty 500

## 2020-01-20 MED ORDER — INSULIN ASPART 100 UNIT/ML ~~LOC~~ SOLN: 0.0000 [IU] | Freq: Every day | SUBCUTANEOUS | Status: DC

## 2020-01-20 MED ORDER — POLYETHYLENE GLYCOL 3350 17 G PO PACK: 17.0000 g | PACK | Freq: Every day | ORAL | Status: DC | PRN

## 2020-01-20 MED ORDER — ACETAMINOPHEN 325 MG PO TABS
650.0000 mg | ORAL_TABLET | Freq: Four times a day (QID) | ORAL | Status: DC | PRN
  Administered 2020-01-20 – 2020-01-23 (×6): 650 mg via ORAL
  Filled 2020-01-20 (×6): qty 2

## 2020-01-20 MED ORDER — SODIUM CHLORIDE 0.9 % IV SOLN
500.0000 mg | INTRAVENOUS | Status: DC
  Filled 2020-01-20 (×2): qty 500

## 2020-01-20 MED ORDER — MELATONIN 3 MG PO TABS
6.0000 mg | ORAL_TABLET | Freq: Every evening | ORAL | Status: DC | PRN
  Administered 2020-01-21: 6 mg via ORAL
  Filled 2020-01-20: qty 2

## 2020-01-20 MED ORDER — FENTANYL CITRATE (PF) 100 MCG/2ML IJ SOLN
25.0000 ug | Freq: Once | INTRAMUSCULAR | Status: AC
  Administered 2020-01-21: 25 ug via INTRAVENOUS
  Filled 2020-01-20: qty 2

## 2020-01-20 MED ORDER — ONDANSETRON HCL 4 MG PO TABS: 4.0000 mg | ORAL_TABLET | Freq: Four times a day (QID) | ORAL | Status: DC | PRN

## 2020-01-20 MED ORDER — SODIUM CHLORIDE 0.9 % IV BOLUS
500.0000 mL | Freq: Once | INTRAVENOUS | Status: AC
  Administered 2020-01-20: 19:00:00 500 mL via INTRAVENOUS

## 2020-01-20 MED ORDER — SODIUM CHLORIDE 0.9 % IV SOLN
1.0000 g | INTRAVENOUS | Status: DC
  Administered 2020-01-21 – 2020-01-24 (×4): 1 g via INTRAVENOUS
  Filled 2020-01-20 (×5): qty 10

## 2020-01-20 MED ORDER — PANTOPRAZOLE SODIUM 40 MG PO TBEC
40.0000 mg | DELAYED_RELEASE_TABLET | Freq: Two times a day (BID) | ORAL | Status: DC
  Administered 2020-01-20 – 2020-01-25 (×10): 40 mg via ORAL
  Filled 2020-01-20 (×10): qty 1

## 2020-01-20 MED ORDER — ENOXAPARIN SODIUM 40 MG/0.4ML ~~LOC~~ SOLN
40.0000 mg | SUBCUTANEOUS | Status: DC
  Administered 2020-01-20 – 2020-01-24 (×5): 40 mg via SUBCUTANEOUS
  Filled 2020-01-20 (×5): qty 0.4

## 2020-01-20 MED ORDER — ALBUTEROL SULFATE (2.5 MG/3ML) 0.083% IN NEBU
2.5000 mg | INHALATION_SOLUTION | RESPIRATORY_TRACT | Status: DC | PRN
  Administered 2020-01-21: 02:00:00 2.5 mg via RESPIRATORY_TRACT
  Filled 2020-01-20: qty 3

## 2020-01-20 MED ORDER — ONDANSETRON HCL 4 MG/2ML IJ SOLN
4.0000 mg | Freq: Four times a day (QID) | INTRAMUSCULAR | Status: DC | PRN
  Administered 2020-01-22 – 2020-01-25 (×2): 4 mg via INTRAVENOUS
  Filled 2020-01-20 (×2): qty 2

## 2020-01-20 MED ORDER — ASPIRIN EC 81 MG PO TBEC
81.0000 mg | DELAYED_RELEASE_TABLET | Freq: Every day | ORAL | Status: DC
  Filled 2020-01-20 (×6): qty 1

## 2020-01-20 MED ORDER — EZETIMIBE 10 MG PO TABS
10.0000 mg | ORAL_TABLET | Freq: Every day | ORAL | Status: DC
  Administered 2020-01-20 – 2020-01-23 (×4): 10 mg via ORAL
  Filled 2020-01-20 (×4): qty 1

## 2020-01-20 MED ORDER — ACETAMINOPHEN 650 MG RE SUPP: 650.0000 mg | Freq: Four times a day (QID) | RECTAL | Status: DC | PRN
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
## 2020-01-20 NOTE — Progress Notes (Addendum)
   01/20/20 2325  Provider Notification  Provider Name/Title Ardith Dark  Date Provider Notified 01/20/20  Time Provider Notified 2325  Notification Type Page  Notification Reason Other (Comment) (waiting on response)    Patient is asking for his dilaudid 4 mg.    Please see MAR for new orders.

## 2020-01-20 NOTE — ED Notes (Signed)
Pts wife contacted and informed Pt requesting food. Pts spouse has agreed to bring Pt food. MD Notified.

## 2020-01-20 NOTE — ED Triage Notes (Signed)
Patient complains of shortness of breath for the past 4 days.
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
## 2020-01-20 NOTE — ED Notes (Signed)
Pt becoming verbally aggressive and agitated towards hospital staff. Pt upset previous shift did not feed him and he wants to go home. Admitting physician notified Pt wishes to leave AMA. Pt reports he has already "called my wife and she understands and is going to pick me up!"

## 2020-01-20 NOTE — ED Provider Notes (Signed)
Medical screening examination/treatment/procedure(s) were conducted as a shared visit with non-physician practitioner(s) and myself.  I personally evaluated the patient during the encounter.  EKG Interpretation  Date/Time:  Thursday Jan 20 2020 15:40:39 EDT Ventricular Rate:  87 PR Interval:    QRS Duration: 91 QT Interval:  373 QTC Calculation: 449 R Axis:   77 Text Interpretation: Sinus rhythm Low voltage, precordial leads RSR' in V1 or V2, right VCD or RVH No significant change since last tracing Confirmed by Fredia Sorrow [phone]) on 01/20/2020 3:43:04 PM   Patient with a complaint of shortness of breath for the past 4 days.  Past medical history is significant for COPD coronary artery disease status post CABG in June 2020.  Initial Covid testing was negative formal Covid test is pending.  Troponin normal as she has a low white blood cell count which makes [place] concerned for viral process.  Also could be exacerbation of COPD.  Patient is hypoxic with ambulation.  Much more comfortable on 2 L of oxygen.  Chest x-ray without acute findings other than some worsening left lower lobe atelectasis or consolidation with possible small effusion.  BNP was not significantly elevated.  Patient will require admission.   Fredia Sorrow, MD 01/20/20 1807
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
## 2020-01-20 NOTE — ED Notes (Signed)
ED Provider at bedside. 

## 2020-01-21 ENCOUNTER — Inpatient Hospital Stay (HOSPITAL_COMMUNITY): Payer: Medicare Other

## 2020-01-21 ENCOUNTER — Encounter (HOSPITAL_COMMUNITY): Payer: Self-pay | Admitting: Internal Medicine

## 2020-01-21 DIAGNOSIS — G894 Chronic pain syndrome: Secondary | ICD-10-CM

## 2020-01-21 DIAGNOSIS — J181 Lobar pneumonia, unspecified organism: Secondary | ICD-10-CM

## 2020-01-21 DIAGNOSIS — J9601 Acute respiratory failure with hypoxia: Secondary | ICD-10-CM

## 2020-01-21 DIAGNOSIS — R0602 Shortness of breath: Secondary | ICD-10-CM

## 2020-01-21 DIAGNOSIS — I5032 Chronic diastolic (congestive) heart failure: Secondary | ICD-10-CM

## 2020-01-21 DIAGNOSIS — I25119 Atherosclerotic heart disease of native coronary artery with unspecified angina pectoris: Secondary | ICD-10-CM

## 2020-01-21 DIAGNOSIS — E782 Mixed hyperlipidemia: Secondary | ICD-10-CM

## 2020-01-21 DIAGNOSIS — D72819 Decreased white blood cell count, unspecified: Secondary | ICD-10-CM

## 2020-01-21 DIAGNOSIS — Z951 Presence of aortocoronary bypass graft: Secondary | ICD-10-CM

## 2020-01-21 HISTORY — DX: Decreased white blood cell count, unspecified: D72.819

## 2020-01-21 LAB — LIPID PANEL
Chol/HDL Ratio: 1.9 ratio (ref 0.0–5.0)
Cholesterol, Total: 58 mg/dL — ABNORMAL LOW (ref 100–199)
HDL: 30 mg/dL — ABNORMAL LOW (ref >39)
LDL Chol Calc (NIH): 14 mg/dL (ref 0–99)
Triglycerides: 58 mg/dL (ref 0–149)
VLDL Cholesterol Cal: 14 mg/dL (ref 5–40)

## 2020-01-21 LAB — RESPIRATORY PANEL BY PCR

## 2020-01-21 LAB — BASIC METABOLIC PANEL
Anion gap: 11 (ref 5–15)
BUN: 16 mg/dL (ref 8–23)
CO2: 20 mmol/L — ABNORMAL LOW (ref 22–32)
Calcium: 7.9 mg/dL — ABNORMAL LOW (ref 8.9–10.3)
Chloride: 106 mmol/L (ref 98–111)
Creatinine, Ser: 0.96 mg/dL (ref 0.61–1.24)
GFR calc Af Amer: >60 mL/min (ref >60)
GFR calc non Af Amer: >60 mL/min (ref >60)
Glucose, Bld: 100 mg/dL — ABNORMAL HIGH (ref 70–99)
Potassium: 3.9 mmol/L (ref 3.5–5.1)
Sodium: 137 mmol/L (ref 135–145)

## 2020-01-21 LAB — CBC
HCT: 40 % (ref 39.0–52.0)
Hemoglobin: 13.2 g/dL (ref 13.0–17.0)
MCH: 31.4 pg (ref 26.0–34.0)
MCHC: 33 g/dL (ref 30.0–36.0)
MCV: 95 fL (ref 80.0–100.0)
Platelets: 178 10*3/uL (ref 150–400)
RBC: 4.21 MIL/uL — ABNORMAL LOW (ref 4.22–5.81)
RDW: 14.3 % (ref 11.5–15.5)
WBC: 2 10*3/uL — ABNORMAL LOW (ref 4.0–10.5)
nRBC: 0 % (ref 0.0–0.2)

## 2020-01-21 LAB — GLUCOSE, CAPILLARY
Glucose-Capillary: 91 mg/dL (ref 70–99)
Glucose-Capillary: 88 mg/dL (ref 70–99)
Glucose-Capillary: 89 mg/dL (ref 70–99)
Glucose-Capillary: 83 mg/dL (ref 70–99)

## 2020-01-21 LAB — ECHOCARDIOGRAM COMPLETE
Height: 71.5 in
Weight: 2992 oz

## 2020-01-21 MED ORDER — HYDROMORPHONE HCL 2 MG PO TABS
2.0000 mg | ORAL_TABLET | Freq: Once | ORAL | Status: AC
  Administered 2020-01-21: 2 mg via ORAL
  Filled 2020-01-21: qty 1

## 2020-01-21 MED ORDER — BUDESONIDE 0.5 MG/2ML IN SUSP
0.5000 mg | Freq: Two times a day (BID) | RESPIRATORY_TRACT | Status: DC
  Administered 2020-01-21 – 2020-01-25 (×8): 0.5 mg via RESPIRATORY_TRACT
  Filled 2020-01-21 (×8): qty 2

## 2020-01-21 MED ORDER — IPRATROPIUM-ALBUTEROL 0.5-2.5 (3) MG/3ML IN SOLN
3.0000 mL | Freq: Four times a day (QID) | RESPIRATORY_TRACT | Status: DC
  Administered 2020-01-21 – 2020-01-22 (×5): 3 mL via RESPIRATORY_TRACT
  Filled 2020-01-21 (×5): qty 3

## 2020-01-21 MED ORDER — TAMSULOSIN HCL 0.4 MG PO CAPS
0.8000 mg | ORAL_CAPSULE | Freq: Every day | ORAL | Status: DC
  Administered 2020-01-21 – 2020-01-24 (×4): 0.8 mg via ORAL
  Filled 2020-01-21 (×5): qty 2

## 2020-01-21 MED ORDER — TRIAMCINOLONE ACETONIDE 0.1 % EX CREA
TOPICAL_CREAM | Freq: Two times a day (BID) | CUTANEOUS | Status: DC
  Administered 2020-01-24: 1 via TOPICAL
  Filled 2020-01-21: qty 15

## 2020-01-21 MED ORDER — METOPROLOL TARTRATE 25 MG PO TABS
12.5000 mg | ORAL_TABLET | Freq: Two times a day (BID) | ORAL | Status: DC
  Administered 2020-01-21 – 2020-01-25 (×9): 12.5 mg via ORAL
  Filled 2020-01-21 (×9): qty 1

## 2020-01-21 NOTE — Progress Notes (Signed)
PROGRESS NOTE  Benjamin [name] [id number] DOB: 14-Oct-1940 DOA: 01/20/2020 PCP: Alycia Rossetti, MD  Brief History:  79 year old male with history of COPD, diastolic CHF, CAD, hyperlipidemia presenting with 4-day history of shortness of breath, intermittent chest discomfort, fatigue, and subjective chills.  The patient stated that he felt like his equilibrium was "off".  He had nonproductive cough.  The patient quit smoking 10 years ago after a greater than 50-pack-year history.  He denies any nausea, vomiting, diarrhea, dysuria, hematochezia, hematuria.  The patient went to see his primary care provider on Jan 20, 2020.  He was noted to have hypoxia and hypotension with systolic blood pressure in the 80s.  There was concern for pneumonia.  As result, the patient was sent to the emergency department for further evaluation.  In addition, the patient had an EKG which showed worsening T wave inversions. In the emergency department, the patient had temperature of 99.0 F.  His blood pressures remained soft with systolic blood pressures in the upper 90s.  Oxygen saturation was 93-94% on 6 L.  Chest x-ray showed possible right lower lobe opacity.  BNP 79.  Patient was started on ceftriaxone and azithromycin.  Assessment/Plan: Acute respiratory failure with hypoxia -Secondary to pneumonia in the setting of underlying COPD -SARS-CoV2--neg -Stable on 6 L [place] -Wean oxygen as tolerated for saturation greater 92%  Right lower lobe opacity -Concerning for pneumonia -CT chest when able -Viral respiratory panel -Continue ceftriaxone and azithromycin -Follow blood cultures -start duo nebs -start pulmicort  Atypical chest pain -Cardiology consult--appreciated--> no further work-up necessary from a cardiology standpoint -Troponins negative -Personally reviewed EKG--sinus rhythm, T wave inversion V1-V3 -Jan 21, 2020 echo--EF 60 to 65%, grade 1 DD, no WMA  Coronary artery disease -Status post CABG  June 2020 -Continue aspirin and metoprolol titrate -Continue Repatha as an outpatient  Chronic diastolic CHF -October 23, 2018 echo EF 55 to 60%, no WMA -Appears clinically euvolemic -Judicious IV fluids  Chronic pain syndrome -PMPAWARE queried--patient receives Dilaudid 2 mg, #30 on 12/20/19 Dr. Buelah Manis; Dilaudid 2 mg, #28 November 19, 2019; Tramadol 50 mg #15 November 17, 2019  Leukopenia -Etiology unclear -Viral respiratory panel -A.m. CBC with differential -SARS-CoV2--neg  BPH -Continue tamsulosin    Disposition Plan: Patient From: Home D/C Place: Home/SNF - 2-3  Days Barriers: Not Clinically Stable--remains sob with minimal activity and increase oxygen demands  Family Communication:   Family at bedside  Consultants:  cardiology  Code Status:  FULL   DVT Prophylaxis:  [place] Lovenox   Procedures: As Listed in Progress Note Above  Antibiotics: Ceftriaxone 5/6>>> azithro 5/6>>>     Subjective: Patient complains of shortness of breath with minimal to no exertion.  He has a nonproductive cough.  He denies any nausea, vomiting, diarrhea, hematochezia, melena.  There is no dysuria or hematuria.  He denies any fevers or chills or headaches.  Objective: Vitals:   01/21/20 0022 01/21/20 0225 01/21/20 0408 01/21/20 0829  BP: 105/70  (!) 88/62 113/77  Pulse: (!) 110  95 89  Resp: 16  20 12   Temp: 99 F (37.2 C)  98.6 F (37 C) 98.2 F (36.8 C)  TempSrc: Oral  Oral Oral  SpO2: (!) 86% 93% 94% 91%  Weight:      Height:        Intake/Output Summary (Last 24 hours) at 01/21/2020 0923 Last data filed at 01/21/2020 0803 Gross per 24 hour  Intake 1669.11 ml  Output 625 ml  Net 1044.11 ml   Weight change:  Exam:   General:  Pt is alert, follows commands appropriately, not in acute distress  HEENT: No icterus, No thrush, No neck mass, [place]/AT  Cardiovascular: RRR, S1/S2, no rubs, no gallops  Respiratory: Diminished breath sounds bilateral.  Scattered bilateral rales.   Minimal basilar wheezing.  Abdomen: Soft/+BS, non tender, non distended, no guarding  Extremities: No edema, No lymphangitis, No petechiae, No rashes, no synovitis   Data Reviewed: I have personally reviewed following labs and imaging studies Basic Metabolic Panel: Recent Labs  Lab 01/20/20 1549 01/21/20 0448  NA 133* 137  K 3.8 3.9  CL 104 106  CO2 22 20*  GLUCOSE 105* 100*  BUN 17 16  CREATININE 1.02 0.96  CALCIUM 8.2* 7.9*   Liver Function Tests: No results for input(s): AST, ALT, ALKPHOS, BILITOT, PROT, ALBUMIN in the last 168 hours. No results for input(s): LIPASE, AMYLASE in the last 168 hours. No results for input(s): AMMONIA in the last 168 hours. Coagulation Profile: No results for input(s): INR, PROTIME in the last 168 hours. CBC: Recent Labs  Lab 01/20/20 1549 01/21/20 0448  WBC 2.7* 2.0*  NEUTROABS 1.7  --   HGB 14.6 13.2  HCT 43.9 40.0  MCV 94.2 95.0  PLT 210 178   Cardiac Enzymes: No results for input(s): CKTOTAL, CKMB, CKMBINDEX, TROPONINI in the last 168 hours. BNP: Invalid input(s): POCBNP CBG: Recent Labs  Lab 01/20/20 2140 01/21/20 0743  GLUCAP 105* 91   HbA1C: No results for input(s): HGBA1C in the last 72 hours. Urine analysis:    Component Value Date/Time   COLORURINE YELLOW 02/25/2019 1043   APPEARANCEUR CLEAR 02/25/2019 1043   LABSPEC 1.019 02/25/2019 1043   PHURINE 5.0 02/25/2019 1043   GLUCOSEU NEGATIVE 02/25/2019 1043   HGBUR NEGATIVE 02/25/2019 1043   [place] 02/25/2019 1043   KETONESUR NEGATIVE 02/25/2019 1043   PROTEINUR NEGATIVE 02/25/2019 1043   NITRITE NEGATIVE 02/25/2019 1043   LEUKOCYTESUR NEGATIVE 02/25/2019 1043   Sepsis Labs: @LABRCNTIP (procalcitonin:4,lacticidven:4) ) Recent Results (from the past 240 hour(s))  Respiratory Panel by RT PCR (Flu A&B, Covid) - Nasopharyngeal Swab     Status: None   Collection Time: 01/20/20  4:31 PM   Specimen: Nasopharyngeal Swab  Result Value Ref Range  Status   SARS Coronavirus 2 by RT PCR NEGATIVE NEGATIVE Final    Comment: (NOTE) SARS-CoV-2 target nucleic acids are NOT DETECTED. The SARS-CoV-2 RNA is generally detectable in upper respiratoy specimens during the acute phase of infection. The lowest concentration of SARS-CoV-2 viral copies this assay can detect is 131 copies/mL. A negative result does not preclude SARS-Cov-2 infection and should not be used as the sole basis for treatment or other patient management decisions. A negative result may occur with  improper specimen collection/handling, submission of specimen other than nasopharyngeal swab, presence of viral mutation(s) within the areas targeted by this assay, and inadequate number of viral copies (<131 copies/mL). A negative result must be combined with clinical observations, patient history, and epidemiological information. The expected result is Negative. Fact Sheet for Patients:  PinkCheek.be Fact Sheet for Healthcare Providers:  GravelBags.it This test is not yet ap proved or cleared by the [place] FDA and  has been authorized for detection and/or diagnosis of SARS-CoV-2 by FDA under an Emergency Use Authorization (EUA). This EUA will remain  in effect (meaning this test can be used) for the duration of the COVID-19 declaration under Section 564(b)(1) of  the Act, 21 U.S.C. section 360bbb-3(b)(1), unless the authorization is terminated or revoked sooner.    Influenza A by PCR NEGATIVE NEGATIVE Final   Influenza B by PCR NEGATIVE NEGATIVE Final    Comment: (NOTE) The Xpert Xpress SARS-CoV-2/FLU/RSV assay is intended as an aid in  the diagnosis of influenza from Nasopharyngeal swab specimens and  should not be used as a sole basis for treatment. Nasal washings and  aspirates are unacceptable for Xpert Xpress SARS-CoV-2/FLU/RSV  testing. Fact Sheet for  Patients: PinkCheek.be Fact Sheet for Healthcare Providers: GravelBags.it This test is not yet approved or cleared by the [place] FDA and  has been authorized for detection and/or diagnosis of SARS-CoV-2 by  FDA under an Emergency Use Authorization (EUA). This EUA will remain  in effect (meaning this test can be used) for the duration of the  Covid-19 declaration under Section 564(b)(1) of the Act, 21  U.S.C. section 360bbb-3(b)(1), unless the authorization is  terminated or revoked. Performed at [facility], [address] [place], [place] [address]   Blood culture (routine x 2)     Status: None (Preliminary result)   Collection Time: 01/20/20  5:28 PM   Specimen: Right Antecubital; Blood  Result Value Ref Range Status   Specimen Description RIGHT ANTECUBITAL  Final   Special Requests   Final    BOTTLES DRAWN AEROBIC AND ANAEROBIC Blood Culture adequate volume   Culture   Final    NO GROWTH < 24 HOURS Performed at [facility], [address] [place], [place] [address]    Report Status PENDING  Incomplete  Blood culture (routine x 2)     Status: None (Preliminary result)   Collection Time: 01/20/20  5:28 PM   Specimen: Right Antecubital; Blood  Result Value Ref Range Status   Specimen Description RIGHT ANTECUBITAL  Final   Special Requests   Final    BOTTLES DRAWN AEROBIC AND ANAEROBIC Blood Culture adequate volume   Culture   Final    NO GROWTH < 24 HOURS Performed at [facility], [address] [place], [place] [address]    Report Status PENDING  Incomplete     Scheduled Meds: . aspirin EC  81 mg Oral Daily  . budesonide (PULMICORT) nebulizer solution  0.5 mg Nebulization BID  . enoxaparin (LOVENOX) injection  40 mg Subcutaneous Q24H  . ezetimibe  10 mg Oral Daily  . insulin aspart  0-5 Units Subcutaneous QHS  . insulin aspart  0-9 Units Subcutaneous TID WC  . ipratropium-albuterol  3 mL Nebulization Q6H   . metoprolol tartrate  12.5 mg Oral BID  . pantoprazole  40 mg Oral BID   Continuous Infusions: . 0.9 % NaCl with KCl 20 mEq / L 75 mL/hr at 01/21/20 0636  . azithromycin    . cefTRIAXone (ROCEPHIN)  IV      Procedures/Studies: DG Chest Portable 1 View  Result Date: 01/20/2020 CLINICAL DATA:  Pt c/o shortness of breath and fevers EXAM: PORTABLE CHEST - 1 VIEW COMPARISON:  06/24/2019 FINDINGS: Atelectasis/consolidation the left lung base, increased since previous. Right lung clear. Mild blunting of the left lateral costophrenic angle. Heart size and mediastinal contours are within normal limits. Previous CABG. No pneumothorax. Visualized bones unremarkable. IMPRESSION: Worsening left lower lobe atelectasis/consolidation with possible small effusion. Electronically Signed   By: Lucrezia Europe M.D.   On: 01/20/2020 16:23    Orson Eva, DO  [facility]  If 7PM-7AM, please contact night-coverage [email or web address] Password [facility] 01/21/2020, 9:23  AM   LOS: 1 day

## 2020-01-21 NOTE — Progress Notes (Signed)
Patient resting in bed, No complains of  difficulty of breathing, oxygen level remains in between 82-88% on 4lpm. O2 increase to 6lpm, Currently on 90%. Notified RT.   RT called in to the room to  assess patient for evaluation.
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
## 2020-01-21 NOTE — Progress Notes (Signed)
Pt hollering out in pain stating both of his hips are in pain and also his stomach. He rated his pain a 10 out of 10 no relief with tylenol. Paged Dr. Humphrey Rolls while looking in pt's medicine requisition and seen pt takes Dilaudid 2mg  Q6 hours for pain. He gave an order for a one time dose of Dilaudid 2mg  p.o.

## 2020-01-21 NOTE — Progress Notes (Signed)
*  PRELIMINARY RESULTS* Echocardiogram 2D Echocardiogram has been performed.  Leavy Cella 01/21/2020, 10:49 AM

## 2020-01-21 NOTE — Care Management Important Message (Signed)
Important Message  Patient Details  Name: Benjamin [name] MRN: [id number] Date of Birth: [birth year]   Medicare Important Message Given:  Yes     [name] 01/21/2020, 3:41 PM

## 2020-01-22 DIAGNOSIS — R0902 Hypoxemia: Secondary | ICD-10-CM

## 2020-01-22 LAB — CBC
HCT: 42.1 % (ref 39.0–52.0)
Hemoglobin: 13.8 g/dL (ref 13.0–17.0)
MCH: 31.4 pg (ref 26.0–34.0)
MCHC: 32.8 g/dL (ref 30.0–36.0)
MCV: 95.7 fL (ref 80.0–100.0)
Platelets: 160 10*3/uL (ref 150–400)
RBC: 4.4 MIL/uL (ref 4.22–5.81)
RDW: 14.2 % (ref 11.5–15.5)
WBC: 1.9 10*3/uL — ABNORMAL LOW (ref 4.0–10.5)
nRBC: 0 % (ref 0.0–0.2)

## 2020-01-22 LAB — COMPREHENSIVE METABOLIC PANEL
ALT: 46 U/L — ABNORMAL HIGH (ref 0–44)
AST: 45 U/L — ABNORMAL HIGH (ref 15–41)
Albumin: 3.2 g/dL — ABNORMAL LOW (ref 3.5–5.0)
Alkaline Phosphatase: 43 U/L (ref 38–126)
Anion gap: 7 (ref 5–15)
BUN: 14 mg/dL (ref 8–23)
CO2: 22 mmol/L (ref 22–32)
Calcium: 7.6 mg/dL — ABNORMAL LOW (ref 8.9–10.3)
Chloride: 106 mmol/L (ref 98–111)
Creatinine, Ser: 0.89 mg/dL (ref 0.61–1.24)
GFR calc Af Amer: >60 mL/min (ref >60)
GFR calc non Af Amer: >60 mL/min (ref >60)
Glucose, Bld: 80 mg/dL (ref 70–99)
Potassium: 4.2 mmol/L (ref 3.5–5.1)
Sodium: 135 mmol/L (ref 135–145)
Total Bilirubin: 0.7 mg/dL (ref 0.3–1.2)
Total Protein: 5.7 g/dL — ABNORMAL LOW (ref 6.5–8.1)

## 2020-01-22 LAB — GLUCOSE, CAPILLARY
Glucose-Capillary: 86 mg/dL (ref 70–99)
Glucose-Capillary: 115 mg/dL — ABNORMAL HIGH (ref 70–99)
Glucose-Capillary: 100 mg/dL — ABNORMAL HIGH (ref 70–99)
Glucose-Capillary: 118 mg/dL — ABNORMAL HIGH (ref 70–99)

## 2020-01-22 LAB — MAGNESIUM: Magnesium: 1.8 mg/dL (ref 1.7–2.4)

## 2020-01-22 LAB — PROCALCITONIN: Procalcitonin: <0.1 ng/mL

## 2020-01-22 MED ORDER — ARFORMOTEROL TARTRATE 15 MCG/2ML IN NEBU
15.0000 ug | INHALATION_SOLUTION | Freq: Two times a day (BID) | RESPIRATORY_TRACT | Status: DC
  Administered 2020-01-22 – 2020-01-24 (×4): 15 ug via RESPIRATORY_TRACT
  Filled 2020-01-22 (×4): qty 2

## 2020-01-22 MED ORDER — DOXYCYCLINE HYCLATE 100 MG PO TABS
100.0000 mg | ORAL_TABLET | Freq: Two times a day (BID) | ORAL | Status: DC
  Administered 2020-01-22: 100 mg via ORAL
  Filled 2020-01-22: qty 1

## 2020-01-22 MED ORDER — SODIUM CHLORIDE 0.9 % IV SOLN
100.0000 mg | Freq: Two times a day (BID) | INTRAVENOUS | Status: DC
  Administered 2020-01-22 – 2020-01-24 (×5): 100 mg via INTRAVENOUS
  Filled 2020-01-22 (×9): qty 100

## 2020-01-22 NOTE — Progress Notes (Signed)
PROGRESS NOTE  Benjamin [name] [id number] DOB: April 05, 1941 DOA: 01/20/2020 PCP: Alycia Rossetti, MD  Brief History:  79 year old male with history of COPD, diastolic CHF, CAD, hyperlipidemia presenting with 4-day history of shortness of breath, intermittent chest discomfort, fatigue, and subjective chills.  The patient stated that he felt like his equilibrium was "off".  He had nonproductive cough.  The patient quit smoking 10 years ago after a greater than 50-pack-year history.  He denies any nausea, vomiting, diarrhea, dysuria, hematochezia, hematuria.  The patient went to see his primary care provider on Jan 20, 2020.  He was noted to have hypoxia and hypotension with systolic blood pressure in the 80s.  There was concern for pneumonia.  As result, the patient was sent to the emergency department for further evaluation.  In addition, the patient had an EKG which showed worsening T wave inversions. In the emergency department, the patient had temperature of 99.0 F.  His blood pressures remained soft with systolic blood pressures in the upper 90s.  Oxygen saturation was 93-94% on 6 L.  Chest x-ray showed possible right lower lobe opacity.  BNP 79.  Patient was started on ceftriaxone and azithromycin.  Assessment/Plan: Acute respiratory failure with hypoxia -Secondary to pneumonia in the setting of underlying COPD -SARS-CoV2--neg -Stable on 6 L [place]>>5L -Wean oxygen as tolerated for saturation greater 92%  Right lower lobe opacity -CT chest--mod to severe centrilobular emphysema; b/L LL and apical scarring; no consolidation or effusions -Viral respiratory panel--neg -Continue ceftriaxone  -change azithro to doxy -Follow blood cultures--neg -continue duo nebs -continue pulmicort and brovana  Leukopenia -B12 -folate -RMSF serology -Ehrlichia serology -CMV DNA -EBV DNA -start empiric doxy -urine legionella antigen -mycoplasma serology -SARS-CoV2--neg  Atypical chest  pain -Cardiology consult--appreciated--> no further work-up necessary from a cardiology standpoint -Troponins negative -Personally reviewed EKG--sinus rhythm, T wave inversion V1-V3 -Jan 21, 2020 echo--EF 60 to 65%, grade 1 DD, no WMA  Coronary artery disease -Status post CABG June 2020 -Continue aspirin and metoprolol titrate -Continue Repatha as an outpatient  Chronic diastolic CHF -October 23, 2018 echo EF 55 to 60%, no WMA -Appears clinically euvolemic -Judicious IV fluids  Chronic pain syndrome -PMPAWARE queried--patient receives Dilaudid 2 mg, #30 on 12/20/19 Dr. Buelah Manis; Dilaudid 2 mg, #28 November 19, 2019; Tramadol 50 mg #15 November 17, 2019  BPH -Continue tamsulosin    Disposition Plan: Patient From: Home D/C Place: Home/SNF - 2-3  Days Barriers: Not Clinically Stable--remains sob with minimal activity and increase oxygen demands; worsen leukopenia  Family Communication:  no Family at bedside  Consultants:  cardiology  Code Status:  FULL   DVT Prophylaxis:  [place] Lovenox   Procedures: As Listed in Progress Note Above  Antibiotics: Ceftriaxone 5/6>>> azithro 5/6>>>    Subjective: Pt complains of myalgia, arthralgia throughout whole body.  Complains of headache without f/c, visual disturbance, focal extremity weakness.  Denies n/v/d, abd pain, dysuria, abd pain  Objective: Vitals:   01/22/20 0741 01/22/20 0751 01/22/20 0840 01/22/20 1247  BP:   116/78   Pulse:   65   Resp:      Temp:      TempSrc:      SpO2: 91% 94%  92%  Weight:      Height:        Intake/Output Summary (Last 24 hours) at 01/22/2020 1423 Last data filed at 01/22/2020 0545 Gross per 24 hour  Intake 1449.26 ml  Output 900 ml  Net 549.26 ml   Weight change:  Exam:   General:  Pt is alert, follows commands appropriately, not in acute distress  HEENT: No icterus, No thrush, No neck mass, [place]/AT  Cardiovascular: RRR, S1/S2, no rubs, no gallops  Respiratory: bibasilar  rales. No wheeze.  Diminished BS  Abdomen: Soft/+BS, non tender, non distended, no guarding  Extremities: No edema, No lymphangitis, No petechiae, No rashes, no synovitis. Mild patchy erythema R-inferior calf   Data Reviewed: I have personally reviewed following labs and imaging studies Basic Metabolic Panel: Recent Labs  Lab 01/20/20 1549 01/21/20 0448 01/22/20 0531  NA 133* 137 135  K 3.8 3.9 4.2  CL 104 106 106  CO2 22 20* 22  GLUCOSE 105* 100* 80  BUN 17 16 14   CREATININE 1.02 0.96 0.89  CALCIUM 8.2* 7.9* 7.6*  MG  --   --  1.8   Liver Function Tests: Recent Labs  Lab 01/22/20 0531  AST 45*  ALT 46*  ALKPHOS 43  BILITOT 0.7  PROT 5.7*  ALBUMIN 3.2*   No results for input(s): LIPASE, AMYLASE in the last 168 hours. No results for input(s): AMMONIA in the last 168 hours. Coagulation Profile: No results for input(s): INR, PROTIME in the last 168 hours. CBC: Recent Labs  Lab 01/20/20 1549 01/21/20 0448 01/22/20 0531  WBC 2.7* 2.0* 1.9*  NEUTROABS 1.7  --   --   HGB 14.6 13.2 13.8  HCT 43.9 40.0 42.1  MCV 94.2 95.0 95.7  PLT 210 178 160   Cardiac Enzymes: No results for input(s): CKTOTAL, CKMB, CKMBINDEX, TROPONINI in the last 168 hours. BNP: Invalid input(s): POCBNP CBG: Recent Labs  Lab 01/21/20 1220 01/21/20 1642 01/21/20 2211 01/22/20 0716 01/22/20 1116  GLUCAP 88 89 83 86 115*   HbA1C: No results for input(s): HGBA1C in the last 72 hours. Urine analysis:    Component Value Date/Time   COLORURINE YELLOW 02/25/2019 1043   APPEARANCEUR CLEAR 02/25/2019 1043   LABSPEC 1.019 02/25/2019 1043   PHURINE 5.0 02/25/2019 1043   GLUCOSEU NEGATIVE 02/25/2019 1043   HGBUR NEGATIVE 02/25/2019 1043   [place] 02/25/2019 1043   KETONESUR NEGATIVE 02/25/2019 1043   PROTEINUR NEGATIVE 02/25/2019 1043   NITRITE NEGATIVE 02/25/2019 1043   LEUKOCYTESUR NEGATIVE 02/25/2019 1043   Sepsis  Labs: @LABRCNTIP (procalcitonin:4,lacticidven:4) ) Recent Results (from the past 240 hour(s))  Respiratory Panel by RT PCR (Flu A&B, Covid) - Nasopharyngeal Swab     Status: None   Collection Time: 01/20/20  4:31 PM   Specimen: Nasopharyngeal Swab  Result Value Ref Range Status   SARS Coronavirus 2 by RT PCR NEGATIVE NEGATIVE Final    Comment: (NOTE) SARS-CoV-2 target nucleic acids are NOT DETECTED. The SARS-CoV-2 RNA is generally detectable in upper respiratoy specimens during the acute phase of infection. The lowest concentration of SARS-CoV-2 viral copies this assay can detect is 131 copies/mL. A negative result does not preclude SARS-Cov-2 infection and should not be used as the sole basis for treatment or other patient management decisions. A negative result may occur with  improper specimen collection/handling, submission of specimen other than nasopharyngeal swab, presence of viral mutation(s) within the areas targeted by this assay, and inadequate number of viral copies (<131 copies/mL). A negative result must be combined with clinical observations, patient history, and epidemiological information. The expected result is Negative. Fact Sheet for Patients:  PinkCheek.be Fact Sheet for Healthcare Providers:  GravelBags.it This test is not yet ap proved or cleared by the [place]  States FDA and  has been authorized for detection and/or diagnosis of SARS-CoV-2 by FDA under an Emergency Use Authorization (EUA). This EUA will remain  in effect (meaning this test can be used) for the duration of the COVID-19 declaration under Section 564(b)(1) of the Act, 21 U.S.C. section 360bbb-3(b)(1), unless the authorization is terminated or revoked sooner.    Influenza A by PCR NEGATIVE NEGATIVE Final   Influenza B by PCR NEGATIVE NEGATIVE Final    Comment: (NOTE) The Xpert Xpress SARS-CoV-2/FLU/RSV assay is intended as an aid in  the  diagnosis of influenza from Nasopharyngeal swab specimens and  should not be used as a sole basis for treatment. Nasal washings and  aspirates are unacceptable for Xpert Xpress SARS-CoV-2/FLU/RSV  testing. Fact Sheet for Patients: PinkCheek.be Fact Sheet for Healthcare Providers: GravelBags.it This test is not yet approved or cleared by the [place] FDA and  has been authorized for detection and/or diagnosis of SARS-CoV-2 by  FDA under an Emergency Use Authorization (EUA). This EUA will remain  in effect (meaning this test can be used) for the duration of the  Covid-19 declaration under Section 564(b)(1) of the Act, 21  U.S.C. section 360bbb-3(b)(1), unless the authorization is  terminated or revoked. Performed at [facility], [address] [place], [place] [address]   Blood culture (routine x 2)     Status: None (Preliminary result)   Collection Time: 01/20/20  5:28 PM   Specimen: Right Antecubital; Blood  Result Value Ref Range Status   Specimen Description RIGHT ANTECUBITAL  Final   Special Requests   Final    BOTTLES DRAWN AEROBIC AND ANAEROBIC Blood Culture adequate volume   Culture   Final    NO GROWTH 2 DAYS Performed at [facility], [address], [place], [place] [address]    Report Status PENDING  Incomplete  Blood culture (routine x 2)     Status: None (Preliminary result)   Collection Time: 01/20/20  5:28 PM   Specimen: Right Antecubital; Blood  Result Value Ref Range Status   Specimen Description RIGHT ANTECUBITAL  Final   Special Requests   Final    BOTTLES DRAWN AEROBIC AND ANAEROBIC Blood Culture adequate volume   Culture   Final    NO GROWTH 2 DAYS Performed at [facility], [address] [place], [place] [address]    Report Status PENDING  Incomplete  Respiratory Panel by PCR     Status: None   Collection Time: 01/21/20 11:00 AM   Specimen: Nasopharyngeal Swab; Respiratory  Result Value Ref  Range Status   Adenovirus NOT DETECTED NOT DETECTED Final   Coronavirus 229E NOT DETECTED NOT DETECTED Final    Comment: (NOTE) The Coronavirus on the Respiratory Panel, DOES NOT test for the novel  Coronavirus (2019 nCoV)    Coronavirus HKU1 NOT DETECTED NOT DETECTED Final   Coronavirus NL63 NOT DETECTED NOT DETECTED Final   Coronavirus OC43 NOT DETECTED NOT DETECTED Final   Metapneumovirus NOT DETECTED NOT DETECTED Final   Rhinovirus / Enterovirus NOT DETECTED NOT DETECTED Final   Influenza A NOT DETECTED NOT DETECTED Final   Influenza B NOT DETECTED NOT DETECTED Final   Parainfluenza Virus 1 NOT DETECTED NOT DETECTED Final   Parainfluenza Virus 2 NOT DETECTED NOT DETECTED Final   Parainfluenza Virus 3 NOT DETECTED NOT DETECTED Final   Parainfluenza Virus 4 NOT DETECTED NOT DETECTED Final   Respiratory Syncytial Virus NOT DETECTED NOT DETECTED Final   Bordetella pertussis NOT DETECTED  NOT DETECTED Final   Chlamydophila pneumoniae NOT DETECTED NOT DETECTED Final   Mycoplasma pneumoniae NOT DETECTED NOT DETECTED Final    Comment: Performed at [facility], [place] [address] [place], [place] [address]     Scheduled Meds: . aspirin EC  81 mg Oral Daily  . budesonide (PULMICORT) nebulizer solution  0.5 mg Nebulization BID  . doxycycline  100 mg Oral Q12H  . enoxaparin (LOVENOX) injection  40 mg Subcutaneous Q24H  . ezetimibe  10 mg Oral Daily  . insulin aspart  0-5 Units Subcutaneous QHS  . insulin aspart  0-9 Units Subcutaneous TID WC  . ipratropium-albuterol  3 mL Nebulization Q6H  . metoprolol tartrate  12.5 mg Oral BID  . pantoprazole  40 mg Oral BID  . tamsulosin  0.8 mg Oral QPC supper  . triamcinolone cream   Topical BID   Continuous Infusions: . 0.9 % NaCl with KCl 20 mEq / L 75 mL/hr at 01/21/20 1839  . cefTRIAXone (ROCEPHIN)  IV 1 g (01/21/20 1833)    Procedures/Studies: CT CHEST WO CONTRAST  Result Date: 01/21/2020 CLINICAL DATA:  Inpatient. COPD  exacerbation. Persistent dyspnea and hypoxia. EXAM: CT CHEST WITHOUT CONTRAST TECHNIQUE: Multidetector CT imaging of the chest was performed following the standard protocol without IV contrast. COMPARISON:  Chest radiograph from one day prior. 10/26/2019 chest CT. FINDINGS: Cardiovascular: Normal heart size. No significant pericardial effusion/thickening. Three-vessel coronary atherosclerosis status post CABG. Atherosclerotic nonaneurysmal thoracic aorta. Normal caliber pulmonary arteries. Mediastinum/Nodes: No discrete thyroid nodules. Unremarkable esophagus. No pathologically enlarged axillary, mediastinal or hilar lymph nodes, noting limited sensitivity for the detection of hilar adenopathy on this noncontrast study. Lungs/Pleura: No pneumothorax. No pleural effusion. Moderate to severe centrilobular and paraseptal emphysema. Platelike scarring versus atelectasis in the lower lobes bilaterally. No acute consolidative airspace disease, lung masses or significant pulmonary nodules. Stable mild biapical pleural-parenchymal scarring. Upper abdomen: Small hiatal hernia. Diffuse hepatic steatosis. Stable 1.6 cm left adrenal adenoma with density -4 HU. Musculoskeletal: No aggressive appearing focal osseous lesions. Mild thoracic spondylosis. IMPRESSION: 1. Platelike scarring versus atelectasis in the lower lobes bilaterally. 2. Moderate to severe centrilobular and paraseptal emphysema. 3. Small hiatal hernia. 4. Diffuse hepatic steatosis. 5. Stable left adrenal adenoma. 6. Aortic Atherosclerosis (ICD10-I70.0) and Emphysema (ICD10-J43.9). Electronically Signed   By: Ilona Sorrel M.D.   On: 01/21/2020 20:26   DG Chest Portable 1 View  Result Date: 01/20/2020 CLINICAL DATA:  Pt c/o shortness of breath and fevers EXAM: PORTABLE CHEST - 1 VIEW COMPARISON:  06/24/2019 FINDINGS: Atelectasis/consolidation the left lung base, increased since previous. Right lung clear. Mild blunting of the left lateral costophrenic angle.  Heart size and mediastinal contours are within normal limits. Previous CABG. No pneumothorax. Visualized bones unremarkable. IMPRESSION: Worsening left lower lobe atelectasis/consolidation with possible small effusion. Electronically Signed   By: Lucrezia Europe M.D.   On: 01/20/2020 16:23   ECHOCARDIOGRAM COMPLETE  Result Date: 01/21/2020    ECHOCARDIOGRAM REPORT   Patient Name:   HAKAN BRACKEEN Date of Exam: 01/21/2020 Medical Rec #:  [id number]  Height:       71.5 in Accession #:    [id number] Weight:       187.0 lb Date of Birth:  June 08, 1941   BSA:          2.060 m Patient Age:    78 years   BP:           113/77 mmHg Patient Gender: M  HR:           89 bpm. Exam Location:  [facility] Procedure: 2D Echo Indications:    Dyspnea 786.09 / R06.00  History:        Patient has prior history of Echocardiogram examinations, most                 recent 10/23/2018. CAD, Prior CABG, COPD; Risk Factors:Former                 Smoker.  Sonographer:    Leavy Cella RDCS (AE) Referring Phys: [place]  1. Left ventricular ejection fraction, by estimation, is 60 to 65%. The left ventricle has normal function. The left ventricle has no regional wall motion abnormalities. Left ventricular diastolic parameters are consistent with Grade I diastolic dysfunction (impaired relaxation).  2. Right ventricular systolic function is normal. The right ventricular size is normal. Tricuspid regurgitation signal is inadequate for assessing PA pressure.  3. The mitral valve is grossly normal. Trivial mitral valve regurgitation.  4. The aortic valve is tricuspid. Aortic valve regurgitation is not visualized.  5. The inferior vena cava is normal in size with greater than 50% respiratory variability, suggesting right atrial pressure of 3 mmHg. FINDINGS  Left Ventricle: Left ventricular ejection fraction, by estimation, is 60 to 65%. The left ventricle has normal function. The left ventricle has no regional wall motion  abnormalities. The left ventricular internal cavity size was normal in size. There is  no left ventricular hypertrophy. Left ventricular diastolic parameters are consistent with Grade I diastolic dysfunction (impaired relaxation). Right Ventricle: The right ventricular size is normal. No increase in right ventricular wall thickness. Right ventricular systolic function is normal. Tricuspid regurgitation signal is inadequate for assessing PA pressure. Left Atrium: Left atrial size was normal in size. Right Atrium: Right atrial size was normal in size. Pericardium: There is no evidence of pericardial effusion. Mitral Valve: The mitral valve is grossly normal. Trivial mitral valve regurgitation. Tricuspid Valve: The tricuspid valve is grossly normal. Tricuspid valve regurgitation is trivial. Aortic Valve: The aortic valve is tricuspid. Aortic valve regurgitation is not visualized. Mild aortic valve annular calcification. Pulmonic Valve: The pulmonic valve was grossly normal. Pulmonic valve regurgitation is trivial. Aorta: The aortic root is normal in size and structure. Venous: The inferior vena cava is normal in size with greater than 50% respiratory variability, suggesting right atrial pressure of 3 mmHg. IAS/Shunts: No atrial level shunt detected by color flow Doppler. Rozann Lesches MD Electronically signed by Rozann Lesches MD Signature Date/Time: 01/21/2020/11:18:38 AM    Final     Orson Eva, DO  [facility]  If 7PM-7AM, please contact night-coverage [email or web address] Password [email or web address] 01/22/2020, 2:23 PM   LOS: 2 days

## 2020-01-23 DIAGNOSIS — D709 Neutropenia, unspecified: Secondary | ICD-10-CM

## 2020-01-23 DIAGNOSIS — D696 Thrombocytopenia, unspecified: Secondary | ICD-10-CM

## 2020-01-23 LAB — COMPREHENSIVE METABOLIC PANEL
ALT: 67 U/L — ABNORMAL HIGH (ref 0–44)
AST: 67 U/L — ABNORMAL HIGH (ref 15–41)
Albumin: 3.1 g/dL — ABNORMAL LOW (ref 3.5–5.0)
Alkaline Phosphatase: 39 U/L (ref 38–126)
Anion gap: 8 (ref 5–15)
BUN: 12 mg/dL (ref 8–23)
CO2: 21 mmol/L — ABNORMAL LOW (ref 22–32)
Calcium: 7.9 mg/dL — ABNORMAL LOW (ref 8.9–10.3)
Chloride: 110 mmol/L (ref 98–111)
Creatinine, Ser: 0.89 mg/dL (ref 0.61–1.24)
GFR calc Af Amer: >60 mL/min (ref >60)
GFR calc non Af Amer: >60 mL/min (ref >60)
Glucose, Bld: 92 mg/dL (ref 70–99)
Potassium: 4.1 mmol/L (ref 3.5–5.1)
Sodium: 139 mmol/L (ref 135–145)
Total Bilirubin: 0.7 mg/dL (ref 0.3–1.2)
Total Protein: 5.5 g/dL — ABNORMAL LOW (ref 6.5–8.1)

## 2020-01-23 LAB — CBC WITH DIFFERENTIAL/PLATELET
Abs Immature Granulocytes: 0.01 10*3/uL (ref 0.00–0.07)
Basophils Absolute: 0 10*3/uL (ref 0.0–0.1)
Basophils Relative: 1 %
Eosinophils Absolute: 0 10*3/uL (ref 0.0–0.5)
Eosinophils Relative: 2 %
HCT: 41.2 % (ref 39.0–52.0)
Hemoglobin: 13.7 g/dL (ref 13.0–17.0)
Immature Granulocytes: 1 %
Lymphocytes Relative: 46 %
Lymphs Abs: 0.7 10*3/uL (ref 0.7–4.0)
MCH: 31.5 pg (ref 26.0–34.0)
MCHC: 33.3 g/dL (ref 30.0–36.0)
MCV: 94.7 fL (ref 80.0–100.0)
Monocytes Absolute: 0.2 10*3/uL (ref 0.1–1.0)
Monocytes Relative: 12 %
Neutro Abs: 0.6 10*3/uL — ABNORMAL LOW (ref 1.7–7.7)
Neutrophils Relative %: 38 %
Platelets: 136 10*3/uL — ABNORMAL LOW (ref 150–400)
RBC: 4.35 MIL/uL (ref 4.22–5.81)
RDW: 14.2 % (ref 11.5–15.5)
WBC: 1.5 10*3/uL — ABNORMAL LOW (ref 4.0–10.5)
nRBC: 0 % (ref 0.0–0.2)

## 2020-01-23 LAB — GLUCOSE, CAPILLARY
Glucose-Capillary: 101 mg/dL — ABNORMAL HIGH (ref 70–99)
Glucose-Capillary: 116 mg/dL — ABNORMAL HIGH (ref 70–99)
Glucose-Capillary: 130 mg/dL — ABNORMAL HIGH (ref 70–99)

## 2020-01-23 MED ORDER — IPRATROPIUM-ALBUTEROL 0.5-2.5 (3) MG/3ML IN SOLN
3.0000 mL | Freq: Four times a day (QID) | RESPIRATORY_TRACT | Status: DC
  Administered 2020-01-23 – 2020-01-25 (×7): 3 mL via RESPIRATORY_TRACT
  Filled 2020-01-23 (×5): qty 3

## 2020-01-23 MED ORDER — EZETIMIBE 10 MG PO TABS
10.0000 mg | ORAL_TABLET | Freq: Every day | ORAL | Status: DC
  Administered 2020-01-24: 10 mg via ORAL
  Filled 2020-01-23: qty 1

## 2020-01-23 NOTE — Progress Notes (Signed)
PROGRESS NOTE  Korbyn [name] [name] DOB: 1941-07-13 DOA: 01/20/2020 PCP: Alycia Rossetti, MD  Brief History: 79 year old male with history of COPD, diastolic CHF, CAD, hyperlipidemia presenting with 4-day history of shortness of breath, intermittent chest discomfort, fatigue, and subjective chills. The patient stated that he felt like his equilibrium was "off". He had nonproductive cough. The patient quit smoking 10 years ago after a greater than 50-pack-year history. He denies any nausea, vomiting, diarrhea, dysuria, hematochezia, hematuria. The patient went to see his primary care provider on Jan 20, 2020. He was noted to have hypoxia and hypotension with systolic blood pressure in the 80s. There was concern for pneumonia. As result, the patient was sent to the emergency department for further evaluation. In addition, the patient had an EKG which showed worsening T wave inversions. In the emergency department, the patient had temperature of 99.0 F. His blood pressures remained soft with systolic blood pressures in the upper 90s. Oxygen saturation was 93-94% on 6 L. Chest x-ray showed possible right lower lobe opacity. BNP 79. Patient was started on ceftriaxone and azithromycin initially.  Assessment/Plan: Acute respiratory failure with hypoxia -Secondary to pneumonia in the setting of underlying COPD -SARS-CoV2--neg -Stable on 6 LNC>>5L>>4L -Wean oxygen as tolerated for saturation greater 92%  Right lower lobe opacity -CT chest--mod to severe centrilobular emphysema; b/L LL and apical scarring; no consolidation or effusions -Viral respiratory panel--neg -Continue ceftriaxone  -changed azithro to doxy -Follow blood cultures--neg -continue duo nebs -continue pulmicort and brovana  Leukopenia -B12 -folate -RMSF serology -Ehrlichia serology -CMV DNA -EBV DNA -LDH -start empiric doxy -urine legionella antigen -mycoplasma  serology -SARS-CoV2--neg -peripheral smear  Atypical chest pain -Cardiology consult--appreciated-->no further work-up necessary from a cardiology standpoint -Troponins negative -Personally reviewed EKG--sinus rhythm, T wave inversion V1-V3 -Jan 21, 2020 echo--EF 60 to 65%, grade 1 DD, no WMA  Coronary artery disease -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 Reid;Dilaudid 2 mg, #28 November 19, 2019; Tramadol 50 mg #15 November 17, 2019  BPH -Continue tamsulosin    Disposition Plan: Patient From: Home D/C Place: Home/SNF - 2-3 Days Barriers: Not Clinically Stable--remains sob with minimal activity and increase oxygen demands; worsen leukopenia  Family Communication: noFamily at bedside  Consultants:cardiology  Code Status: FULL   DVT Prophylaxis: [place] Lovenox   Procedures: As Listed in Progress Note Above  Antibiotics: Ceftriaxone 5/6>>> azithro 5/6>>>5/8 Doxy 5/8>>>   Subjective: Pt feels like he is breathing better, but still sob with mild exertion.  Complains dry cough.  Denies f/c, cp, n/v/d, abd pain, dysuria, synovitis  Objective: Vitals:   01/23/20 0751 01/23/20 0758 01/23/20 0805 01/23/20 1422  BP:      Pulse:      Resp:      Temp:      TempSrc:      SpO2: 94% 98% 100% 94%  Weight:      Height:        Intake/Output Summary (Last 24 hours) at 01/23/2020 1518 Last data filed at 01/23/2020 1513 Gross per 24 hour  Intake 1082 ml  Output 400 ml  Net 682 ml   Weight change:  Exam:   General:  Pt is alert, follows commands appropriately, not in acute  distress  HEENT: No icterus, No thrush, No neck mass, [place]/AT  Cardiovascular: RRR, S1/S2, no  rubs, no gallops  Respiratory: diminished breath sounds bilateral.  Bibasilar rales. No wheeze  Abdomen: Soft/+BS, non tender, non distended, no guarding  Extremities: No edema, No lymphangitis, No petechiae, No rashes, no synovitis   Data Reviewed: I have personally reviewed following labs and imaging studies Basic Metabolic Panel: Recent Labs  Lab 01/20/20 1549 01/21/20 0448 01/22/20 0531 01/23/20 0659  NA 133* 137 135 139  K 3.8 3.9 4.2 4.1  CL 104 106 106 110  CO2 22 20* 22 21*  GLUCOSE 105* 100* 80 92  BUN 17 16 14 12   CREATININE 1.02 0.96 0.89 0.89  CALCIUM 8.2* 7.9* 7.6* 7.9*  MG  --   --  1.8  --    Liver Function Tests: Recent Labs  Lab 01/22/20 0531 01/23/20 0659  AST 45* 67*  ALT 46* 67*  ALKPHOS 43 39  BILITOT 0.7 0.7  PROT 5.7* 5.5*  ALBUMIN 3.2* 3.1*   No results for input(s): LIPASE, AMYLASE in the last 168 hours. No results for input(s): AMMONIA in the last 168 hours. Coagulation Profile: No results for input(s): INR, PROTIME in the last 168 hours. CBC: Recent Labs  Lab 01/20/20 1549 01/21/20 0448 01/22/20 0531 01/23/20 0659  WBC 2.7* 2.0* 1.9* 1.5*  NEUTROABS 1.7  --   --  0.6*  HGB 14.6 13.2 13.8 13.7  HCT 43.9 40.0 42.1 41.2  MCV 94.2 95.0 95.7 94.7  PLT 210 178 160 136*   Cardiac Enzymes: No results for input(s): CKTOTAL, CKMB, CKMBINDEX, TROPONINI in the last 168 hours. BNP: Invalid input(s): POCBNP CBG: Recent Labs  Lab 01/22/20 0716 01/22/20 1116 01/22/20 1603 01/22/20 2128 01/23/20 1124  GLUCAP 86 115* 100* 118* 101*   HbA1C: No results for input(s): HGBA1C in the last 72 hours. Urine analysis:    Component Value Date/Time   COLORURINE YELLOW 02/25/2019 1043   APPEARANCEUR CLEAR 02/25/2019 1043   LABSPEC 1.019 02/25/2019 1043   PHURINE 5.0 02/25/2019 1043   GLUCOSEU NEGATIVE 02/25/2019 1043   HGBUR NEGATIVE 02/25/2019 1043   [place] 02/25/2019 1043   KETONESUR NEGATIVE 02/25/2019 1043   PROTEINUR  NEGATIVE 02/25/2019 1043   NITRITE NEGATIVE 02/25/2019 1043   LEUKOCYTESUR NEGATIVE 02/25/2019 1043   Sepsis Labs: @LABRCNTIP (procalcitonin:4,lacticidven:4) ) Recent Results (from the past 240 hour(s))  Respiratory Panel by RT PCR (Flu A&B, Covid) - Nasopharyngeal Swab     Status: None   Collection Time: 01/20/20  4:31 PM   Specimen: Nasopharyngeal Swab  Result Value Ref Range Status   SARS Coronavirus 2 by RT PCR NEGATIVE NEGATIVE Final    Comment: (NOTE) SARS-CoV-2 target nucleic acids are NOT DETECTED. The SARS-CoV-2 RNA is generally detectable in upper respiratoy specimens during the acute phase of infection. The lowest concentration of SARS-CoV-2 viral copies this assay can detect is 131 copies/mL. A negative result does not preclude SARS-Cov-2 infection and should not be used as the sole basis for treatment or other patient management decisions. A negative result may occur with  improper specimen collection/handling, submission of specimen other than nasopharyngeal swab, presence of viral mutation(s) within the areas targeted by this assay, and inadequate number of viral copies (<131 copies/mL). A negative result must be combined with clinical observations, patient history, and epidemiological information. The expected result is Negative. Fact Sheet for Patients:  PinkCheek.be Fact Sheet for Healthcare Providers:  GravelBags.it This test is not yet ap proved or  cleared by the [place] and  has been authorized for detection and/or diagnosis of SARS-CoV-2 by FDA under an Emergency Use Authorization (EUA). This EUA will remain  in effect (meaning this test can be used) for the duration of the COVID-19 declaration under Section 564(b)(1) of the Act, 21 U.S.C. section 360bbb-3(b)(1), unless the authorization is terminated or revoked sooner.    Influenza A by PCR NEGATIVE NEGATIVE Final   Influenza B by PCR  NEGATIVE NEGATIVE Final    Comment: (NOTE) The Xpert Xpress SARS-CoV-2/FLU/RSV assay is intended as an aid in  the diagnosis of influenza from Nasopharyngeal swab specimens and  should not be used as a sole basis for treatment. Nasal washings and  aspirates are unacceptable for Xpert Xpress SARS-CoV-2/FLU/RSV  testing. Fact Sheet for Patients: PinkCheek.be Fact Sheet for Healthcare Providers: GravelBags.it This test is not yet approved or cleared by the [place] FDA and  has been authorized for detection and/or diagnosis of SARS-CoV-2 by  FDA under an Emergency Use Authorization (EUA). This EUA will remain  in effect (meaning this test can be used) for the duration of the  Covid-19 declaration under Section 564(b)(1) of the Act, 21  U.S.C. section 360bbb-3(b)(1), unless the authorization is  terminated or revoked. Performed at [facility], [address] [place], [place] [address]   Blood culture (routine x 2)     Status: None (Preliminary result)   Collection Time: 01/20/20  5:28 PM   Specimen: Right Antecubital; Blood  Result Value Ref Range Status   Specimen Description RIGHT ANTECUBITAL  Final   Special Requests   Final    BOTTLES DRAWN AEROBIC AND ANAEROBIC Blood Culture adequate volume   Culture   Final    NO GROWTH 3 DAYS Performed at [facility], [address], [place], [place] [address]    Report Status PENDING  Incomplete  Blood culture (routine x 2)     Status: None (Preliminary result)   Collection Time: 01/20/20  5:28 PM   Specimen: Right Antecubital; Blood  Result Value Ref Range Status   Specimen Description RIGHT ANTECUBITAL  Final   Special Requests   Final    BOTTLES DRAWN AEROBIC AND ANAEROBIC Blood Culture adequate volume   Culture   Final    NO GROWTH 3 DAYS Performed at [facility], [address], [place], [place] [address]    Report Status PENDING  Incomplete  Respiratory Panel by PCR      Status: None   Collection Time: 01/21/20 11:00 AM   Specimen: Nasopharyngeal Swab; Respiratory  Result Value Ref Range Status   Adenovirus NOT DETECTED NOT DETECTED Final   Coronavirus 229E NOT DETECTED NOT DETECTED Final    Comment: (NOTE) The Coronavirus on the Respiratory Panel, DOES NOT test for the novel  Coronavirus (2019 nCoV)    Coronavirus HKU1 NOT DETECTED NOT DETECTED Final   Coronavirus NL63 NOT DETECTED NOT DETECTED Final   Coronavirus OC43 NOT DETECTED NOT DETECTED Final   Metapneumovirus NOT DETECTED NOT DETECTED Final   Rhinovirus / Enterovirus NOT DETECTED NOT DETECTED Final   Influenza A NOT DETECTED NOT DETECTED Final   Influenza B NOT DETECTED NOT DETECTED Final   Parainfluenza Virus 1 NOT DETECTED NOT DETECTED Final   Parainfluenza Virus 2 NOT DETECTED NOT DETECTED Final   Parainfluenza Virus 3 NOT DETECTED NOT DETECTED Final   Parainfluenza Virus 4 NOT DETECTED NOT DETECTED Final   Respiratory Syncytial Virus NOT DETECTED NOT DETECTED Final  Bordetella pertussis NOT DETECTED NOT DETECTED Final   Chlamydophila pneumoniae NOT DETECTED NOT DETECTED Final   Mycoplasma pneumoniae NOT DETECTED NOT DETECTED Final    Comment: Performed at [facility], [place] [address] [place], [place] [address]     Scheduled Meds:  arformoterol  15 mcg Nebulization BID   aspirin EC  81 mg Oral Daily   budesonide (PULMICORT) nebulizer solution  0.5 mg Nebulization BID   enoxaparin (LOVENOX) injection  40 mg Subcutaneous Q24H   ezetimibe  10 mg Oral Daily   ipratropium-albuterol  3 mL Nebulization Q6H WA   metoprolol tartrate  12.5 mg Oral BID   pantoprazole  40 mg Oral BID   tamsulosin  0.8 mg Oral QPC supper   triamcinolone cream   Topical BID   Continuous Infusions:  cefTRIAXone (ROCEPHIN)  IV 1 g (01/22/20 1720)   doxycycline (VIBRAMYCIN) IV Stopped (01/23/20 1237)    Procedures/Studies: CT CHEST WO CONTRAST  Result Date: 01/21/2020 CLINICAL DATA:   Inpatient. COPD exacerbation. Persistent dyspnea and hypoxia. EXAM: CT CHEST WITHOUT CONTRAST TECHNIQUE: Multidetector CT imaging of the chest was performed following the standard protocol without IV contrast. COMPARISON:  Chest radiograph from one day prior. 10/26/2019 chest CT. FINDINGS: Cardiovascular: Normal heart size. No significant pericardial effusion/thickening. Three-vessel coronary atherosclerosis status post CABG. Atherosclerotic nonaneurysmal thoracic aorta. Normal caliber pulmonary arteries. Mediastinum/Nodes: No discrete thyroid nodules. Unremarkable esophagus. No pathologically enlarged axillary, mediastinal or hilar lymph nodes, noting limited sensitivity for the detection of hilar adenopathy on this noncontrast study. Lungs/Pleura: No pneumothorax. No pleural effusion. Moderate to severe centrilobular and paraseptal emphysema. Platelike scarring versus atelectasis in the lower lobes bilaterally. No acute consolidative airspace disease, lung masses or significant pulmonary nodules. Stable mild biapical pleural-parenchymal scarring. Upper abdomen: Small hiatal hernia. Diffuse hepatic steatosis. Stable 1.6 cm left adrenal adenoma with density -4 HU. Musculoskeletal: No aggressive appearing focal osseous lesions. Mild thoracic spondylosis. IMPRESSION: 1. Platelike scarring versus atelectasis in the lower lobes bilaterally. 2. Moderate to severe centrilobular and paraseptal emphysema. 3. Small hiatal hernia. 4. Diffuse hepatic steatosis. 5. Stable left adrenal adenoma. 6. Aortic Atherosclerosis (ICD10-I70.0) and Emphysema (ICD10-J43.9). Electronically Signed   By: Ilona Sorrel M.D.   On: 01/21/2020 20:26   DG Chest Portable 1 View  Result Date: 01/20/2020 CLINICAL DATA:  Pt c/o shortness of breath and fevers EXAM: PORTABLE CHEST - 1 VIEW COMPARISON:  06/24/2019 FINDINGS: Atelectasis/consolidation the left lung base, increased since previous. Right lung clear. Mild blunting of the left lateral  costophrenic angle. Heart size and mediastinal contours are within normal limits. Previous CABG. No pneumothorax. Visualized bones unremarkable. IMPRESSION: Worsening left lower lobe atelectasis/consolidation with possible small effusion. Electronically Signed   By: Lucrezia Europe M.D.   On: 01/20/2020 16:23   ECHOCARDIOGRAM COMPLETE  Result Date: 01/21/2020    ECHOCARDIOGRAM REPORT   Patient Name:   Benjamin [name] Date of Exam: 01/21/2020 Medical Rec #:  [id number]  Height:       71.5 in Accession #:    [id number] Weight:       187.0 lb Date of Birth:  Dec 31, 1940   BSA:          2.060 m Patient Age:    15 years   BP:           113/77 mmHg Patient Gender: M          HR:           89 bpm. Exam Location:  [facility]  Procedure: 2D Echo Indications:    Dyspnea 786.09 / R06.00  History:        Patient has prior history of Echocardiogram examinations, most                 recent 10/23/2018. CAD, Prior CABG, COPD; Risk Factors:Former                 Smoker.  Sonographer:    Leavy Cella RDCS (AE) Referring Phys: [place]  1. Left ventricular ejection fraction, by estimation, is 60 to 65%. The left ventricle has normal function. The left ventricle has no regional wall motion abnormalities. Left ventricular diastolic parameters are consistent with Grade I diastolic dysfunction (impaired relaxation).  2. Right ventricular systolic function is normal. The right ventricular size is normal. Tricuspid regurgitation signal is inadequate for assessing PA pressure.  3. The mitral valve is grossly normal. Trivial mitral valve regurgitation.  4. The aortic valve is tricuspid. Aortic valve regurgitation is not visualized.  5. The inferior vena cava is normal in size with greater than 50% respiratory variability, suggesting right atrial pressure of 3 mmHg. FINDINGS  Left Ventricle: Left ventricular ejection fraction, by estimation, is 60 to 65%. The left ventricle has normal function. The left ventricle has no regional  wall motion abnormalities. The left ventricular internal cavity size was normal in size. There is  no left ventricular hypertrophy. Left ventricular diastolic parameters are consistent with Grade I diastolic dysfunction (impaired relaxation). Right Ventricle: The right ventricular size is normal. No increase in right ventricular wall thickness. Right ventricular systolic function is normal. Tricuspid regurgitation signal is inadequate for assessing PA pressure. Left Atrium: Left atrial size was normal in size. Right Atrium: Right atrial size was normal in size. Pericardium: There is no evidence of pericardial effusion. Mitral Valve: The mitral valve is grossly normal. Trivial mitral valve regurgitation. Tricuspid Valve: The tricuspid valve is grossly normal. Tricuspid valve regurgitation is trivial. Aortic Valve: The aortic valve is tricuspid. Aortic valve regurgitation is not visualized. Mild aortic valve annular calcification. Pulmonic Valve: The pulmonic valve was grossly normal. Pulmonic valve regurgitation is trivial. Aorta: The aortic root is normal in size and structure. Venous: The inferior vena cava is normal in size with greater than 50% respiratory variability, suggesting right atrial pressure of 3 mmHg. IAS/Shunts: No atrial level shunt detected by color flow Doppler. Rozann Lesches MD Electronically signed by Rozann Lesches MD Signature Date/Time: 01/21/2020/11:18:38 AM    Final     Orson Eva, DO  [facility]  If 7PM-7AM, please contact night-coverage [email or web address] Password [email or web address] 01/23/2020, 3:18 PM   LOS: 3 days

## 2020-01-24 DIAGNOSIS — D708 Other neutropenia: Secondary | ICD-10-CM

## 2020-01-24 DIAGNOSIS — D696 Thrombocytopenia, unspecified: Secondary | ICD-10-CM

## 2020-01-24 DIAGNOSIS — J189 Pneumonia, unspecified organism: Principal | ICD-10-CM

## 2020-01-24 LAB — CBC WITH DIFFERENTIAL/PLATELET
Abs Immature Granulocytes: 0.01 10*3/uL (ref 0.00–0.07)
Basophils Absolute: 0 10*3/uL (ref 0.0–0.1)
Basophils Relative: 1 %
Eosinophils Absolute: 0.1 10*3/uL (ref 0.0–0.5)
Eosinophils Relative: 3 %
HCT: 41.6 % (ref 39.0–52.0)
Hemoglobin: 13.9 g/dL (ref 13.0–17.0)
Immature Granulocytes: 1 %
Lymphocytes Relative: 43 %
Lymphs Abs: 0.7 10*3/uL (ref 0.7–4.0)
MCH: 31.5 pg (ref 26.0–34.0)
MCHC: 33.4 g/dL (ref 30.0–36.0)
MCV: 94.3 fL (ref 80.0–100.0)
Monocytes Absolute: 0.2 10*3/uL (ref 0.1–1.0)
Monocytes Relative: 11 %
Neutro Abs: 0.7 10*3/uL — ABNORMAL LOW (ref 1.7–7.7)
Neutrophils Relative %: 41 %
Platelets: 131 10*3/uL — ABNORMAL LOW (ref 150–400)
RBC: 4.41 MIL/uL (ref 4.22–5.81)
RDW: 14.3 % (ref 11.5–15.5)
WBC: 1.7 10*3/uL — ABNORMAL LOW (ref 4.0–10.5)
nRBC: 0 % (ref 0.0–0.2)

## 2020-01-24 LAB — COMPREHENSIVE METABOLIC PANEL
ALT: 82 U/L — ABNORMAL HIGH (ref 0–44)
AST: 79 U/L — ABNORMAL HIGH (ref 15–41)
Albumin: 3.3 g/dL — ABNORMAL LOW (ref 3.5–5.0)
Alkaline Phosphatase: 41 U/L (ref 38–126)
Anion gap: 9 (ref 5–15)
BUN: 11 mg/dL (ref 8–23)
CO2: 23 mmol/L (ref 22–32)
Calcium: 8.2 mg/dL — ABNORMAL LOW (ref 8.9–10.3)
Chloride: 105 mmol/L (ref 98–111)
Creatinine, Ser: 0.79 mg/dL (ref 0.61–1.24)
GFR calc Af Amer: >60 mL/min (ref >60)
GFR calc non Af Amer: >60 mL/min (ref >60)
Glucose, Bld: 95 mg/dL (ref 70–99)
Potassium: 4 mmol/L (ref 3.5–5.1)
Sodium: 137 mmol/L (ref 135–145)
Total Bilirubin: 0.8 mg/dL (ref 0.3–1.2)
Total Protein: 5.7 g/dL — ABNORMAL LOW (ref 6.5–8.1)

## 2020-01-24 LAB — EHRLICHIA ANTIBODY PANEL
E chaffeensis (HGE) Ab, IgG: NEGATIVE
E chaffeensis (HGE) Ab, IgM: NEGATIVE
E. Chaffeensis (HME) IgM Titer: NEGATIVE
E.Chaffeensis (HME) IgG: NEGATIVE

## 2020-01-24 LAB — VITAMIN B12: Vitamin B-12: 209 pg/mL (ref 180–914)

## 2020-01-24 LAB — GLUCOSE, CAPILLARY
Glucose-Capillary: 87 mg/dL (ref 70–99)
Glucose-Capillary: 90 mg/dL (ref 70–99)
Glucose-Capillary: 92 mg/dL (ref 70–99)
Glucose-Capillary: 88 mg/dL (ref 70–99)

## 2020-01-24 LAB — FOLATE: Folate: 15.9 ng/mL (ref >5.9)

## 2020-01-24 LAB — TECHNOLOGIST SMEAR REVIEW

## 2020-01-24 LAB — MAGNESIUM: Magnesium: 1.7 mg/dL (ref 1.7–2.4)

## 2020-01-24 LAB — LACTATE DEHYDROGENASE: LDH: 197 U/L — ABNORMAL HIGH (ref 98–192)

## 2020-01-24 MED ORDER — CYANOCOBALAMIN 1000 MCG/ML IJ SOLN
1000.0000 ug | Freq: Every day | INTRAMUSCULAR | Status: DC
  Administered 2020-01-24 – 2020-01-25 (×2): 1000 ug via INTRAMUSCULAR
  Filled 2020-01-24 (×2): qty 1

## 2020-01-24 MED ORDER — DIPHENHYDRAMINE-ZINC ACETATE 2-0.1 % EX CREA
TOPICAL_CREAM | Freq: Three times a day (TID) | CUTANEOUS | Status: DC | PRN
  Administered 2020-01-24: 1 via TOPICAL
  Filled 2020-01-24 (×2): qty 28

## 2020-01-24 MED ORDER — MAGNESIUM SULFATE 2 GM/50ML IV SOLN
2.0000 g | Freq: Once | INTRAVENOUS | Status: AC
  Administered 2020-01-24: 2 g via INTRAVENOUS
  Filled 2020-01-24: qty 50

## 2020-01-24 NOTE — Care Management Important Message (Signed)
Important Message  Patient Details  Name: Benjamin [name] MRN: [id number] Date of Birth: 03/25/1941   Medicare Important Message Given:  Yes     [name] 01/24/2020, 4:06 PM

## 2020-01-24 NOTE — Plan of Care (Signed)
  Problem: Activity: Goal: Ability to tolerate increased activity will improve Outcome: Progressing   Problem: Clinical Measurements: Goal: Ability to maintain a body temperature in the normal range will improve Outcome: Progressing   Problem: Respiratory: Goal: Ability to maintain adequate ventilation will improve Outcome: Progressing Goal: Ability to maintain a clear airway will improve Outcome: Progressing   Problem: Education: Goal: Knowledge of General Education information will improve Description: Including pain rating scale, medication(s)/side effects and non-pharmacologic comfort measures Outcome: Progressing   Problem: Health Behavior/Discharge Planning: Goal: Ability to manage health-related needs will improve Outcome: Progressing   Problem: Clinical Measurements: Goal: Ability to maintain clinical measurements within normal limits will improve Outcome: Progressing Goal: Will remain free from infection Outcome: Progressing Goal: Diagnostic test results will improve Outcome: Progressing Goal: Respiratory complications will improve Outcome: Progressing Goal: Cardiovascular complication will be avoided Outcome: Progressing   Problem: Activity: Goal: Risk for activity intolerance will decrease Outcome: Progressing   Problem: Nutrition: Goal: Adequate nutrition will be maintained Outcome: Progressing   Problem: Coping: Goal: Level of anxiety will decrease Outcome: Progressing   Problem: Elimination: Goal: Will not experience complications related to bowel motility Outcome: Progressing Goal: Will not experience complications related to urinary retention Outcome: Progressing   Problem: Safety: Goal: Ability to remain free from injury will improve Outcome: Progressing   Problem: Skin Integrity: Goal: Risk for impaired skin integrity will decrease Outcome: Progressing   

## 2020-01-24 NOTE — Evaluation (Signed)
Physical Therapy Evaluation Patient Details Name: Benjamin [name] MRN: [id number] DOB: August 03, 1941 Today's Date: 01/24/2020   History of Present Illness  Benjamin [name] is a 79 y.o. male with medical history significant for COPD, diastolic CHF, diabetes mellitus, CABG.  Patient presented to the ED with complaints of difficulty breathing that has significantly worsened over the past 4 days.  He also reports associated chills over the past few weeks and fatigue, with reduced p.o. intake over the past few days.  Denies cough, no wheezing.  No vomiting no loose stools.  Reports some mild chest soreness to lower chest, but no actual chest pain.Patient saw his primary care provider today, was referred to the ED, for hypoxia, possible pneumonia, was also hypotensive, also EKG showed T wave inversions in V2 and V3, concern for coronary ischemia.    Clinical Impression  Patient limited for functional mobility as stated below secondary to BLE weakness, fatigue and fair/poor standing balance.  Patient unsteady on feet requiring use of RW for safety during ambulation, ambulated on room air with SpO2 maintaining at 93%, limited mostly due to c/o fatigue/generalized weakness, left on room air and tolerated sitting up in chair after therapy - RN notified. Patient will benefit from continued physical therapy in hospital and recommended venue below to increase strength, balance, endurance for safe ADLs and gait.     Follow Up Recommendations Home health PT    Equipment Recommendations       Recommendations for Other Services       Precautions / Restrictions Precautions Precautions: Fall Restrictions Weight Bearing Restrictions: No      Mobility  Bed Mobility Overal bed mobility: Needs Assistance Bed Mobility: Supine to Sit     Supine to sit: Min guard     General bed mobility comments: increased time, labored movement  Transfers Overall transfer level: Needs assistance Equipment used: Rolling walker (2  wheeled) Transfers: Sit to/from [facility] Sit to Stand: Min guard;Min assist Stand pivot transfers: Min guard;Min assist       General transfer comment: increased time, labored movement, unsteady on feet requiring the use of RW  Ambulation/Gait Ambulation/Gait assistance: Min guard;Min assist Gait Distance (Feet): 45 Feet Assistive device: Rolling walker (2 wheeled);4-wheeled walker Gait Pattern/deviations: Decreased step length - right;Decreased step length - left;Decreased stride length Gait velocity: decreased   General Gait Details: slow labored cadence without loss of balance, unsteady on feet when attempting without AD, had to use RW for safety  Stairs            Wheelchair Mobility    Modified Rankin (Stroke Patients Only)       Balance Overall balance assessment: Needs assistance Sitting-balance support: Feet supported;No upper extremity supported Sitting balance-Leahy Scale: Fair Sitting balance - Comments: seated at EOB, occasional falling backwards when putting on socks   Standing balance support: During functional activity;No upper extremity supported Standing balance-Leahy Scale: Poor Standing balance comment: fair using RW                             Pertinent Vitals/Pain Pain Assessment: No/denies pain    Home Living Family/patient expects to be discharged to:: Private residence Living Arrangements: Spouse/significant other Available Help at Discharge: Family;Available 24 hours/day Type of Home: House Home Access: Stairs to enter Entrance Stairs-Rails: Right Entrance Stairs-Number of Steps: 4 Home Layout: One level Home Equipment: Shower seat;Bedside commode;Walker - 2 wheels;Cane - single point  Prior Function Level of Independence: Independent         Comments: Hydrographic surveyor, drives     Hand Dominance   Dominant Hand: Right    Extremity/Trunk Assessment   Upper Extremity Assessment Upper  Extremity Assessment: Generalized weakness    Lower Extremity Assessment Lower Extremity Assessment: Generalized weakness    Cervical / Trunk Assessment Cervical / Trunk Assessment: Normal  Communication   Communication: No difficulties  Cognition Arousal/Alertness: Awake/alert Behavior During Therapy: WFL for tasks assessed/performed Overall Cognitive Status: Within Functional Limits for tasks assessed                                        General Comments      Exercises     Assessment/Plan    PT Assessment Patient needs continued PT services  PT Problem List Decreased strength;Decreased activity tolerance;Decreased balance;Decreased mobility       PT Treatment Interventions Gait training;Stair training;Functional mobility training;Therapeutic activities;Therapeutic exercise;Balance training;Patient/family education    PT Goals (Current goals can be found in the Care Plan section)  Acute Rehab PT Goals Patient Stated Goal: return home with family to assist PT Goal Formulation: With patient Time For Goal Achievement: 01/28/20 Potential to Achieve Goals: Good    Frequency Min 3X/week   Barriers to discharge        Co-evaluation               AM-PAC PT "6 Clicks" Mobility  Outcome Measure Help needed turning from your back to your side while in a flat bed without using bedrails?: None Help needed moving from lying on your back to sitting on the side of a flat bed without using bedrails?: A Little Help needed moving to and from a bed to a chair (including a wheelchair)?: A Little Help needed standing up from a chair using your arms (e.g., wheelchair or bedside chair)?: A Little Help needed to walk in hospital room?: A Little Help needed climbing 3-5 steps with a railing? : A Lot 6 Click Score: 18    End of Session   Activity Tolerance: Patient tolerated treatment well;Patient limited by fatigue Patient left: in chair;with call bell/phone  within reach Nurse Communication: Mobility status PT Visit Diagnosis: Unsteadiness on feet (R26.81);Other abnormalities of gait and mobility (R26.89);Muscle weakness (generalized) (M62.81)    Time: [id number] PT Time Calculation (min) (ACUTE ONLY): 28 min   Charges:   PT Evaluation $PT Eval Moderate Complexity: 1 Mod PT Treatments $Therapeutic Activity: 23-37 mins        2:14 PM, 01/24/20 Lonell Grandchild, MPT Physical Therapist with [facility] [phone] office [phone] mobile phone

## 2020-01-24 NOTE — Discharge Summary (Signed)
Physician Discharge Summary  Celia Eltz [id number] DOB: 11-Nov-1940 DOA: 01/20/2020  PCP: Alycia Rossetti, MD  Admit date: 01/20/2020 Discharge date: 01/25/2020  Admitted From: Home Disposition:  Home   Recommendations for Outpatient Follow-up:  1. Follow up with PCP in 1-2 weeks 2. Please obtain CBC/LFTs in one week   Home Health: yes Equipment/Devices: HHPT  Discharge Condition: Stable CODE STATUS: FULL Diet recommendation: Heart Healthy   Brief/Interim Summary: 79 year old male with history of COPD, diastolic CHF, CAD, hyperlipidemia presenting with 4-day history of shortness of breath, intermittent chest discomfort, fatigue, and subjective chills. The patient stated that he felt like his equilibrium was "off". He had nonproductive cough. The patient quit smoking 10 years ago after a greater than 50-pack-year history. He denies any nausea, vomiting, diarrhea, dysuria, hematochezia, hematuria. The patient went to see his primary care provider on Jan 20, 2020. He was noted to have hypoxia and hypotension with systolic blood pressure in the 80s. There was concern for pneumonia. As result, the patient was sent to the emergency department for further evaluation. In addition, the patient had an EKG which showed worsening T wave inversions. In the emergency department, the patient had temperature of 99.0 F. His blood pressures remained soft with systolic blood pressures in the upper 90s. Oxygen saturation was 93-94% on 6 L. Chest x-ray showed possible right lower lobe opacity. BNP 79. Patient was started on ceftriaxone and azithromycin initially.  Azithromycin was switched to doxy.  His hospitalization was prolonged due to leukopenia and thrombocytopenia.  These eventually reached a nadir and began improving.  Hematology was consulted and had low suspicion of MDS or malignancy.  He will follow up with hematology after d/c.  Discharge Diagnoses:  Acute respiratory failure with  hypoxia -Secondary to pneumonia in the setting of underlying COPD -SARS-CoV2--neg -Stable on 6 LNC>>5L>>4L>>>RA -Wean oxygen as tolerated for saturation greater 92%  Right lower lobe opacity -CT chest--mod to severe centrilobular emphysema; b/L LL and apical scarring; no consolidation or effusions -Viral respiratory panel--neg -Continue ceftriaxone--had 6 days  -changedazithro to doxy-->4 more days after d/c -Follow blood cultures--neg -continuedduo nebs -continuedpulmicort and brovana  Leukopenia -B12--209-->supplement--received 2 injections during hospitalization, dc with po -folate-->15.9 -RMSF serology--pending -Ehrlichia serology--neg -CMV DNA--NEG -EBV DNA--pending -LDH--197 -continue empiric doxy--4 more days after d/c to finish 1 week -urine legionella antigen--pending at time of d/c -mycoplasma serology -SARS-CoV2--neg -peripheral smear--results pending -hematology consult appreciated-->likely due to infection, follow up clinic after d/c  Atypical chest pain -Cardiology consult--appreciated-->no further work-up necessary from a cardiology standpoint -Troponins negative -Personally reviewed EKG--sinus rhythm, T wave inversion V1-V3 -Jan 21, 2020 echo--EF 60 to 65%, grade 1 DD, no WMA  Coronary artery disease -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 Carlisle-Rockledge;Dilaudid 2 mg, #28 November 19, 2019; Tramadol 50 mg #15 November 17, 2019  BPH -Continue tamsulosin  Transaminasemia -RUQ ultrasound--neg GB -hep B and C serology pending -no abd pain -tolerating diet -follow up PCP for recheck in 1 week -likely related to hepatic steatosis and recent infection    Discharge  Instructions   Allergies as of 01/25/2020      Reactions   Arsenic Swelling   Severe swelling if patient comes in contact    Contrast Media [iodinated Diagnostic Agents] Swelling, Rash  Statins Rash   Joint pain      Medication List    STOP taking these medications   montelukast 10 MG tablet Commonly known as: SINGULAIR     TAKE these medications   acetaminophen 500 MG tablet Commonly known as: TYLENOL Take 1,000 mg by mouth every 6 (six) hours as needed for moderate pain or headache.   CRANBERRY EXTRACT PO Take 300 mg by mouth 2 (two) times daily.   doxycycline 100 MG tablet Commonly known as: VIBRA-TABS Take 1 tablet (100 mg total) by mouth every 12 (twelve) hours.   ezetimibe 10 MG tablet Commonly known as: ZETIA Take 1 tablet by mouth once daily   Fish Oil 1000 MG Caps Take 1 capsule by mouth 2 (two) times daily.   HYDROmorphone 2 MG tablet Commonly known as: DILAUDID Take 1 tablet (2 mg total) by mouth every 6 (six) hours as needed.   ipratropium-albuterol 0.5-2.5 (3) MG/3ML Soln Commonly known as: DUONEB Take 3 mLs by nebulization 2 (two) times daily.   metoprolol tartrate 25 MG tablet Commonly known as: LOPRESSOR Take 0.5 tablets (12.5 mg total) by mouth 2 (two) times daily.   pantoprazole 40 MG tablet Commonly known as: PROTONIX Take 40 mg by mouth 2 (two) times daily.   Repatha SureClick [id number] MG/ML Soaj Generic drug: Evolocumab Inject 1 Dose into the skin every 14 (fourteen) days.   tamsulosin 0.4 MG Caps capsule Commonly known as: FLOMAX TAKE 2 CAPSULES BY MOUTH ONCE DAILY AFTER SUPPER FOR PROSTATE What changed: See the new instructions.   vitamin B-12 500 MCG tablet Commonly known as: CYANOCOBALAMIN Take 1 tablet (500 mcg total) by mouth daily. Start taking on: Jan 26, 2020      Follow-up Information    [facility].   Specialty: Home Health Services Why: Home health PT.  Contact information: [place] [phone]          Allergies  Allergen Reactions  . Arsenic Swelling    Severe swelling if patient comes in contact   . Contrast Media [Iodinated Diagnostic Agents] Swelling and Rash  . Statins Rash    Joint pain    Consultations:  hematology   Procedures/Studies: CT CHEST WO CONTRAST  Result Date: 01/21/2020 CLINICAL DATA:  Inpatient. COPD exacerbation. Persistent dyspnea and hypoxia. EXAM: CT CHEST WITHOUT CONTRAST TECHNIQUE: Multidetector CT imaging of the chest was performed following the standard protocol without IV contrast. COMPARISON:  Chest radiograph from one day prior. 10/26/2019 chest CT. FINDINGS: Cardiovascular: Normal heart size. No significant pericardial effusion/thickening. Three-vessel coronary atherosclerosis status post CABG. Atherosclerotic nonaneurysmal thoracic aorta. Normal caliber pulmonary arteries. Mediastinum/Nodes: No discrete thyroid nodules. Unremarkable esophagus. No pathologically enlarged axillary, mediastinal or hilar lymph nodes, noting limited sensitivity for the detection of hilar adenopathy on this noncontrast study. Lungs/Pleura: No pneumothorax. No pleural effusion. Moderate to severe centrilobular and paraseptal emphysema. Platelike scarring versus atelectasis in the lower lobes bilaterally. No acute consolidative airspace disease, lung masses or significant pulmonary nodules. Stable mild biapical pleural-parenchymal scarring. Upper abdomen: Small hiatal hernia. Diffuse hepatic steatosis. Stable 1.6 cm left adrenal adenoma with density -4 HU. Musculoskeletal: No aggressive appearing focal osseous lesions. Mild thoracic spondylosis. IMPRESSION: 1. Platelike scarring versus atelectasis in the lower lobes bilaterally. 2. Moderate to severe centrilobular and paraseptal emphysema. 3. Small hiatal hernia. 4. Diffuse hepatic steatosis. 5. Stable left adrenal adenoma. 6. Aortic Atherosclerosis (ICD10-I70.0) and Emphysema  (ICD10-J43.9). Electronically Signed   By: Rinaldo Ratel  Poff M.D.   On: 01/21/2020 20:26   DG Chest Portable 1 View  Result Date: 01/20/2020 CLINICAL DATA:  Pt c/o shortness of breath and fevers EXAM: PORTABLE CHEST - 1 VIEW COMPARISON:  06/24/2019 FINDINGS: Atelectasis/consolidation the left lung base, increased since previous. Right lung clear. Mild blunting of the left lateral costophrenic angle. Heart size and mediastinal contours are within normal limits. Previous CABG. No pneumothorax. Visualized bones unremarkable. IMPRESSION: Worsening left lower lobe atelectasis/consolidation with possible small effusion. Electronically Signed   By: Lucrezia Europe M.D.   On: 01/20/2020 16:23   ECHOCARDIOGRAM COMPLETE  Result Date: 01/21/2020    ECHOCARDIOGRAM REPORT   Patient Name:   RAYLAN HEADINGS Date of Exam: 01/21/2020 Medical Rec #:  [id number]  Height:       71.5 in Accession #:    [id number] Weight:       187.0 lb Date of Birth:  [birth year]   BSA:          2.060 m Patient Age:    82 years   BP:           113/77 mmHg Patient Gender: M          HR:           89 bpm. Exam Location:  [facility] Procedure: 2D Echo Indications:    Dyspnea 786.09 / R06.00  History:        Patient has prior history of Echocardiogram examinations, most                 recent 10/23/2018. CAD, Prior CABG, COPD; Risk Factors:Former                 Smoker.  Sonographer:    Leavy Cella RDCS (AE) Referring Phys: [place]  1. Left ventricular ejection fraction, by estimation, is 60 to 65%. The left ventricle has normal function. The left ventricle has no regional wall motion abnormalities. Left ventricular diastolic parameters are consistent with Grade I diastolic dysfunction (impaired relaxation).  2. Right ventricular systolic function is normal. The right ventricular size is normal. Tricuspid regurgitation signal is inadequate for assessing PA pressure.  3. The mitral valve is grossly normal. Trivial mitral valve regurgitation.   4. The aortic valve is tricuspid. Aortic valve regurgitation is not visualized.  5. The inferior vena cava is normal in size with greater than 50% respiratory variability, suggesting right atrial pressure of 3 mmHg. FINDINGS  Left Ventricle: Left ventricular ejection fraction, by estimation, is 60 to 65%. The left ventricle has normal function. The left ventricle has no regional wall motion abnormalities. The left ventricular internal cavity size was normal in size. There is  no left ventricular hypertrophy. Left ventricular diastolic parameters are consistent with Grade I diastolic dysfunction (impaired relaxation). Right Ventricle: The right ventricular size is normal. No increase in right ventricular wall thickness. Right ventricular systolic function is normal. Tricuspid regurgitation signal is inadequate for assessing PA pressure. Left Atrium: Left atrial size was normal in size. Right Atrium: Right atrial size was normal in size. Pericardium: There is no evidence of pericardial effusion. Mitral Valve: The mitral valve is grossly normal. Trivial mitral valve regurgitation. Tricuspid Valve: The tricuspid valve is grossly normal. Tricuspid valve regurgitation is trivial. Aortic Valve: The aortic valve is tricuspid. Aortic valve regurgitation is not visualized. Mild aortic valve annular calcification. Pulmonic Valve: The pulmonic valve was grossly normal. Pulmonic valve regurgitation is trivial. Aorta: The aortic root is normal in size and  structure. Venous: The inferior vena cava is normal in size with greater than 50% respiratory variability, suggesting right atrial pressure of 3 mmHg. IAS/Shunts: No atrial level shunt detected by color flow Doppler. Rozann Lesches MD Electronically signed by Rozann Lesches MD Signature Date/Time: 01/21/2020/11:18:38 AM    Final    US Abdomen Limited RUQ  Result Date: 01/25/2020 CLINICAL DATA:  Transaminitis EXAM: ULTRASOUND ABDOMEN LIMITED RIGHT UPPER QUADRANT COMPARISON:   CT 01/23/2018 FINDINGS: Gallbladder: No gallstones or wall thickening visualized. No sonographic Murphy sign noted by sonographer. Common bile duct: Diameter: 3 mm Liver: No focal lesion identified. Diffusely increased hepatic parenchymal echogenicity. Portal vein is patent on color Doppler imaging with normal direction of blood flow towards the liver. Other: None. IMPRESSION: The echogenicity of the liver is increased. This is a nonspecific finding but is most commonly seen with fatty infiltration of the liver. There are no obvious focal liver lesions. Electronically Signed   By: Davina Poke D.O.   On: 01/25/2020 14:18        Discharge Exam: Vitals:   01/25/20 0658 01/25/20 0821  BP: 115/76   Pulse: 70   Resp: 18   Temp: 98.5 F (36.9 C)   SpO2: 93% 93%   Vitals:   01/24/20 2231 01/25/20 0113 01/25/20 0658 01/25/20 0821  BP: 135/78  115/76   Pulse: 68  70   Resp: 16  18   Temp: 97.9 F (36.6 C)  98.5 F (36.9 C)   TempSrc: Oral     SpO2: 93% 90% 93% 93%  Weight:      Height:        General: Pt is alert, awake, not in acute distress Cardiovascular: RRR, S1/S2 +, no rubs, no gallops Respiratory: bibasilar rales. No wheeze Abdominal: Soft, NT, ND, bowel sounds + Extremities: no edema, no cyanosis   The results of significant diagnostics from this hospitalization (including imaging, microbiology, ancillary and laboratory) are listed below for reference.    Significant Diagnostic Studies: CT CHEST WO CONTRAST  Result Date: 01/21/2020 CLINICAL DATA:  Inpatient. COPD exacerbation. Persistent dyspnea and hypoxia. EXAM: CT CHEST WITHOUT CONTRAST TECHNIQUE: Multidetector CT imaging of the chest was performed following the standard protocol without IV contrast. COMPARISON:  Chest radiograph from one day prior. 10/26/2019 chest CT. FINDINGS: Cardiovascular: Normal heart size. No significant pericardial effusion/thickening. Three-vessel coronary atherosclerosis status post CABG.  Atherosclerotic nonaneurysmal thoracic aorta. Normal caliber pulmonary arteries. Mediastinum/Nodes: No discrete thyroid nodules. Unremarkable esophagus. No pathologically enlarged axillary, mediastinal or hilar lymph nodes, noting limited sensitivity for the detection of hilar adenopathy on this noncontrast study. Lungs/Pleura: No pneumothorax. No pleural effusion. Moderate to severe centrilobular and paraseptal emphysema. Platelike scarring versus atelectasis in the lower lobes bilaterally. No acute consolidative airspace disease, lung masses or significant pulmonary nodules. Stable mild biapical pleural-parenchymal scarring. Upper abdomen: Small hiatal hernia. Diffuse hepatic steatosis. Stable 1.6 cm left adrenal adenoma with density -4 HU. Musculoskeletal: No aggressive appearing focal osseous lesions. Mild thoracic spondylosis. IMPRESSION: 1. Platelike scarring versus atelectasis in the lower lobes bilaterally. 2. Moderate to severe centrilobular and paraseptal emphysema. 3. Small hiatal hernia. 4. Diffuse hepatic steatosis. 5. Stable left adrenal adenoma. 6. Aortic Atherosclerosis (ICD10-I70.0) and Emphysema (ICD10-J43.9). Electronically Signed   By: Ilona Sorrel M.D.   On: 01/21/2020 20:26   DG Chest Portable 1 View  Result Date: 01/20/2020 CLINICAL DATA:  Pt c/o shortness of breath and fevers EXAM: PORTABLE CHEST - 1 VIEW COMPARISON:  06/24/2019 FINDINGS: Atelectasis/consolidation the left lung base,  increased since previous. Right lung clear. Mild blunting of the left lateral costophrenic angle. Heart size and mediastinal contours are within normal limits. Previous CABG. No pneumothorax. Visualized bones unremarkable. IMPRESSION: Worsening left lower lobe atelectasis/consolidation with possible small effusion. Electronically Signed   By: Lucrezia Europe M.D.   On: 01/20/2020 16:23   ECHOCARDIOGRAM COMPLETE  Result Date: 01/21/2020    ECHOCARDIOGRAM REPORT   Patient Name:   ABISAI RIGO Date of Exam: 01/21/2020  Medical Rec #:  [id number]  Height:       71.5 in Accession #:    [id number] Weight:       187.0 lb Date of Birth:  January 06, 1941   BSA:          2.060 m Patient Age:    57 years   BP:           113/77 mmHg Patient Gender: M          HR:           89 bpm. Exam Location:  [facility] Procedure: 2D Echo Indications:    Dyspnea 786.09 / R06.00  History:        Patient has prior history of Echocardiogram examinations, most                 recent 10/23/2018. CAD, Prior CABG, COPD; Risk Factors:Former                 Smoker.  Sonographer:    Leavy Cella RDCS (AE) Referring Phys: [place]  1. Left ventricular ejection fraction, by estimation, is 60 to 65%. The left ventricle has normal function. The left ventricle has no regional wall motion abnormalities. Left ventricular diastolic parameters are consistent with Grade I diastolic dysfunction (impaired relaxation).  2. Right ventricular systolic function is normal. The right ventricular size is normal. Tricuspid regurgitation signal is inadequate for assessing PA pressure.  3. The mitral valve is grossly normal. Trivial mitral valve regurgitation.  4. The aortic valve is tricuspid. Aortic valve regurgitation is not visualized.  5. The inferior vena cava is normal in size with greater than 50% respiratory variability, suggesting right atrial pressure of 3 mmHg. FINDINGS  Left Ventricle: Left ventricular ejection fraction, by estimation, is 60 to 65%. The left ventricle has normal function. The left ventricle has no regional wall motion abnormalities. The left ventricular internal cavity size was normal in size. There is  no left ventricular hypertrophy. Left ventricular diastolic parameters are consistent with Grade I diastolic dysfunction (impaired relaxation). Right Ventricle: The right ventricular size is normal. No increase in right ventricular wall thickness. Right ventricular systolic function is normal. Tricuspid regurgitation signal is  inadequate for assessing PA pressure. Left Atrium: Left atrial size was normal in size. Right Atrium: Right atrial size was normal in size. Pericardium: There is no evidence of pericardial effusion. Mitral Valve: The mitral valve is grossly normal. Trivial mitral valve regurgitation. Tricuspid Valve: The tricuspid valve is grossly normal. Tricuspid valve regurgitation is trivial. Aortic Valve: The aortic valve is tricuspid. Aortic valve regurgitation is not visualized. Mild aortic valve annular calcification. Pulmonic Valve: The pulmonic valve was grossly normal. Pulmonic valve regurgitation is trivial. Aorta: The aortic root is normal in size and structure. Venous: The inferior vena cava is normal in size with greater than 50% respiratory variability, suggesting right atrial pressure of 3 mmHg. IAS/Shunts: No atrial level shunt detected by color flow Doppler. Rozann Lesches MD Electronically signed by Rozann Lesches MD  Signature Date/Time: 01/21/2020/11:18:38 AM    Final    US Abdomen Limited RUQ  Result Date: 01/25/2020 CLINICAL DATA:  Transaminitis EXAM: ULTRASOUND ABDOMEN LIMITED RIGHT UPPER QUADRANT COMPARISON:  CT 01/23/2018 FINDINGS: Gallbladder: No gallstones or wall thickening visualized. No sonographic Murphy sign noted by sonographer. Common bile duct: Diameter: 3 mm Liver: No focal lesion identified. Diffusely increased hepatic parenchymal echogenicity. Portal vein is patent on color Doppler imaging with normal direction of blood flow towards the liver. Other: None. IMPRESSION: The echogenicity of the liver is increased. This is a nonspecific finding but is most commonly seen with fatty infiltration of the liver. There are no obvious focal liver lesions. Electronically Signed   By: Davina Poke D.O.   On: 01/25/2020 14:18     Microbiology: Recent Results (from the past 240 hour(s))  Respiratory Panel by RT PCR (Flu A&B, Covid) - Nasopharyngeal Swab     Status: None   Collection Time:  01/20/20  4:31 PM   Specimen: Nasopharyngeal Swab  Result Value Ref Range Status   SARS Coronavirus 2 by RT PCR NEGATIVE NEGATIVE Final    Comment: (NOTE) SARS-CoV-2 target nucleic acids are NOT DETECTED. The SARS-CoV-2 RNA is generally detectable in upper respiratoy specimens during the acute phase of infection. The lowest concentration of SARS-CoV-2 viral copies this assay can detect is 131 copies/mL. A negative result does not preclude SARS-Cov-2 infection and should not be used as the sole basis for treatment or other patient management decisions. A negative result may occur with  improper specimen collection/handling, submission of specimen other than nasopharyngeal swab, presence of viral mutation(s) within the areas targeted by this assay, and inadequate number of viral copies (<131 copies/mL). A negative result must be combined with clinical observations, patient history, and epidemiological information. The expected result is Negative. Fact Sheet for Patients:  PinkCheek.be Fact Sheet for Healthcare Providers:  GravelBags.it This test is not yet ap proved or cleared by the [place] FDA and  has been authorized for detection and/or diagnosis of SARS-CoV-2 by FDA under an Emergency Use Authorization (EUA). This EUA will remain  in effect (meaning this test can be used) for the duration of the COVID-19 declaration under Section 564(b)(1) of the Act, 21 U.S.C. section 360bbb-3(b)(1), unless the authorization is terminated or revoked sooner.    Influenza A by PCR NEGATIVE NEGATIVE Final   Influenza B by PCR NEGATIVE NEGATIVE Final    Comment: (NOTE) The Xpert Xpress SARS-CoV-2/FLU/RSV assay is intended as an aid in  the diagnosis of influenza from Nasopharyngeal swab specimens and  should not be used as a sole basis for treatment. Nasal washings and  aspirates are unacceptable for Xpert Xpress SARS-CoV-2/FLU/RSV    testing. Fact Sheet for Patients: PinkCheek.be Fact Sheet for Healthcare Providers: GravelBags.it This test is not yet approved or cleared by the [place] FDA and  has been authorized for detection and/or diagnosis of SARS-CoV-2 by  FDA under an Emergency Use Authorization (EUA). This EUA will remain  in effect (meaning this test can be used) for the duration of the  Covid-19 declaration under Section 564(b)(1) of the Act, 21  U.S.C. section 360bbb-3(b)(1), unless the authorization is  terminated or revoked. Performed at [facility], [address], [place], [place] [address]   Blood culture (routine x 2)     Status: None   Collection Time: 01/20/20  5:28 PM   Specimen: Right Antecubital; Blood  Result Value Ref Range Status   Specimen Description RIGHT ANTECUBITAL  Final   Special Requests   Final    BOTTLES DRAWN AEROBIC AND ANAEROBIC Blood Culture adequate volume   Culture   Final    NO GROWTH 5 DAYS Performed at [facility], [address] [place], [place] [address]    Report Status 01/25/2020 FINAL  Final  Blood culture (routine x 2)     Status: None   Collection Time: 01/20/20  5:28 PM   Specimen: Right Antecubital; Blood  Result Value Ref Range Status   Specimen Description RIGHT ANTECUBITAL  Final   Special Requests   Final    BOTTLES DRAWN AEROBIC AND ANAEROBIC Blood Culture adequate volume   Culture   Final    NO GROWTH 5 DAYS Performed at [facility], [address] [place], [place] [address]    Report Status 01/25/2020 FINAL  Final  Respiratory Panel by PCR     Status: None   Collection Time: 01/21/20 11:00 AM   Specimen: Nasopharyngeal Swab; Respiratory  Result Value Ref Range Status   Adenovirus NOT DETECTED NOT DETECTED Final   Coronavirus 229E NOT DETECTED NOT DETECTED Final    Comment: (NOTE) The Coronavirus on the Respiratory Panel, DOES NOT test for the novel  Coronavirus (2019 nCoV)     Coronavirus HKU1 NOT DETECTED NOT DETECTED Final   Coronavirus NL63 NOT DETECTED NOT DETECTED Final   Coronavirus OC43 NOT DETECTED NOT DETECTED Final   Metapneumovirus NOT DETECTED NOT DETECTED Final   Rhinovirus / Enterovirus NOT DETECTED NOT DETECTED Final   Influenza A NOT DETECTED NOT DETECTED Final   Influenza B NOT DETECTED NOT DETECTED Final   Parainfluenza Virus 1 NOT DETECTED NOT DETECTED Final   Parainfluenza Virus 2 NOT DETECTED NOT DETECTED Final   Parainfluenza Virus 3 NOT DETECTED NOT DETECTED Final   Parainfluenza Virus 4 NOT DETECTED NOT DETECTED Final   Respiratory Syncytial Virus NOT DETECTED NOT DETECTED Final   Bordetella pertussis NOT DETECTED NOT DETECTED Final   Chlamydophila pneumoniae NOT DETECTED NOT DETECTED Final   Mycoplasma pneumoniae NOT DETECTED NOT DETECTED Final    Comment: Performed at [facility], [place] [address] [place], [place] [address]     Labs: Basic Metabolic Panel: Recent Labs  Lab 01/21/20 0448 01/21/20 0448 01/22/20 0531 01/22/20 0531 01/23/20 0659 01/23/20 0659 01/24/20 0638 01/25/20 0539  NA 137  --  135  --  139  --  137 139  K 3.9   < > 4.2   < > 4.1   < > 4.0 3.8  CL 106  --  106  --  110  --  105 106  CO2 20*  --  22  --  21*  --  23 24  GLUCOSE 100*  --  80  --  92  --  95 99  BUN 16  --  14  --  12  --  11 12  CREATININE 0.96  --  0.89  --  0.89  --  0.79 0.80  CALCIUM 7.9*  --  7.6*  --  7.9*  --  8.2* 8.2*  MG  --   --  1.8  --   --   --  1.7 2.2   < > = values in this interval not displayed.   Liver Function Tests: Recent Labs  Lab 01/22/20 0531 01/23/20 0659 01/24/20 0638 01/25/20 0539  AST 45* 67* 79* 114*  ALT 46* 67* 82* 117*  ALKPHOS 43 39 41 44  BILITOT 0.7  0.7 0.8 0.9  PROT 5.7* 5.5* 5.7* 5.9*  ALBUMIN 3.2* 3.1* 3.3* 3.4*   No results for input(s): LIPASE, AMYLASE in the last 168 hours. No results for input(s): AMMONIA in the last 168 hours. CBC: Recent Labs  Lab 01/20/20 1549  01/20/20 1549 01/21/20 0448 01/22/20 0531 01/23/20 0659 01/24/20 0638 01/25/20 0539  WBC 2.7*   < > 2.0* 1.9* 1.5* 1.7* 1.9*  NEUTROABS 1.7  --   --   --  0.6* 0.7* 0.7*  HGB 14.6   < > 13.2 13.8 13.7 13.9 14.5  HCT 43.9   < > 40.0 42.1 41.2 41.6 43.4  MCV 94.2   < > 95.0 95.7 94.7 94.3 94.1  PLT 210   < > 178 160 136* 131* 139*   < > = values in this interval not displayed.   Cardiac Enzymes: No results for input(s): CKTOTAL, CKMB, CKMBINDEX, TROPONINI in the last 168 hours. BNP: Invalid input(s): POCBNP CBG: Recent Labs  Lab 01/23/20 1620 01/23/20 2059 01/24/20 0802 01/24/20 1123 01/24/20 1618  GLUCAP 116* 130* 90 92 88    Time coordinating discharge:  36 minutes  Signed:  Orson Eva, DO [facility] Pager: [phone] 01/25/2020, 2:59 PM

## 2020-01-24 NOTE — Progress Notes (Signed)
PROGRESS NOTE  Benjamin [name] [id number] DOB: Aug 03, 1941 DOA: 01/20/2020 PCP: Alycia Rossetti, MD  Brief History: 79 year old male with history of COPD, diastolic CHF, CAD, hyperlipidemia presenting with 4-day history of shortness of breath, intermittent chest discomfort, fatigue, and subjective chills. The patient stated that he felt like his equilibrium was "off". He had nonproductive cough. The patient quit smoking 10 years ago after a greater than 50-pack-year history. He denies any nausea, vomiting, diarrhea, dysuria, hematochezia, hematuria. The patient went to see his primary care provider on Jan 20, 2020. He was noted to have hypoxia and hypotension with systolic blood pressure in the 80s. There was concern for pneumonia. As result, the patient was sent to the emergency department for further evaluation. In addition, the patient had an EKG which showed worsening T wave inversions. In the emergency department, the patient had temperature of 99.0 F. His blood pressures remained soft with systolic blood pressures in the upper 90s. Oxygen saturation was 93-94% on 6 L. Chest x-ray showed possible right lower lobe opacity. BNP 79. Patient was started on ceftriaxone and azithromycin initially.  Assessment/Plan: Acute respiratory failure with hypoxia -Secondary to pneumonia in the setting of underlying COPD -SARS-CoV2--neg -Stable on 6 LNC>>5L>>4L -Wean oxygen as tolerated for saturation greater 92%  Right lower lobe opacity -CT chest--mod to severe centrilobular emphysema; b/L LL and apical scarring; no consolidation or effusions -Viral respiratory panel--neg -Continue ceftriaxone -changed azithro to doxy -Follow blood cultures--neg -continueduo nebs -continuepulmicort and brovana  Leukopenia -B12--209-->supplement -folate-->15.9 -RMSF serology -Ehrlichia serology--neg -CMV DNA -EBV DNA -LDH -continue empiric doxy -urine legionella  antigen -mycoplasma serology -SARS-CoV2--neg -peripheral smear -hematology consult  Atypical chest pain -Cardiology consult--appreciated-->no further work-up necessary from a cardiology standpoint -Troponins negative -Personally reviewed EKG--sinus rhythm, T wave inversion V1-V3 -Jan 21, 2020 echo--EF 60 to 65%, grade 1 DD, no WMA  Coronary artery disease -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 [place];Dilaudid 2 mg, #28 November 19, 2019; Tramadol 50 mg #15 November 17, 2019  BPH -Continue tamsulosin    Disposition Plan: Patient From: Home D/C Place: Home - 1-2 Days Barriers: Not Clinically Stable--remains leukopenic, hematology consulted  Family Communication:spouse updated 5/10  Consultants:cardiology  Code Status: FULL   DVT Prophylaxis: [place] Lovenox   Procedures: As Listed in Progress Note Above  Antibiotics: Ceftriaxone 5/6>>> azithro 5/6>>>5/8 Doxy 5/8>>>  Subjective: Patient denies fevers, chills, headache, chest pain, dyspnea, nausea, vomiting, diarrhea, abdominal pain, dysuria, hematuria, hematochezia, and melena.   Objective: Vitals:   01/24/20 0500 01/24/20 0755 01/24/20 1425 01/24/20 1457  BP: (!) 91/55   99/75  Pulse: 73   70  Resp: 16   18  Temp: 97.9 F (36.6 C)     TempSrc: Oral     SpO2: 94% 94% 94% 96%  Weight:      Height:        Intake/Output Summary (Last 24 hours) at 01/24/2020 1607 Last data filed at 01/24/2020 1500 Gross per 24 hour  Intake 480 ml  Output --  Net 480 ml   Weight change:  Exam:   General:  Pt is alert, follows commands appropriately, not in acute distress  HEENT: No icterus, No thrush, No neck mass,  [place]/AT  Cardiovascular:  RRR, S1/S2, no rubs, no gallops  Respiratory: diminished breath sounds.  Bibasilar rales. No wheeze  Abdomen: Soft/+BS, non tender, non distended, no guarding  Extremities: No edema, No lymphangitis, No petechiae, No rashes, no synovitis   Data Reviewed: I have personally reviewed following labs and imaging studies Basic Metabolic Panel: Recent Labs  Lab 01/20/20 1549 01/21/20 0448 01/22/20 0531 01/23/20 0659 01/24/20 0638  NA 133* 137 135 139 137  K 3.8 3.9 4.2 4.1 4.0  CL 104 106 106 110 105  CO2 22 20* 22 21* 23  GLUCOSE 105* 100* 80 92 95  BUN 17 16 14 12 11   CREATININE 1.02 0.96 0.89 0.89 0.79  CALCIUM 8.2* 7.9* 7.6* 7.9* 8.2*  MG  --   --  1.8  --  1.7   Liver Function Tests: Recent Labs  Lab 01/22/20 0531 01/23/20 0659 01/24/20 0638  AST 45* 67* 79*  ALT 46* 67* 82*  ALKPHOS 43 39 41  BILITOT 0.7 0.7 0.8  PROT 5.7* 5.5* 5.7*  ALBUMIN 3.2* 3.1* 3.3*   No results for input(s): LIPASE, AMYLASE in the last 168 hours. No results for input(s): AMMONIA in the last 168 hours. Coagulation Profile: No results for input(s): INR, PROTIME in the last 168 hours. CBC: Recent Labs  Lab 01/20/20 1549 01/21/20 0448 01/22/20 0531 01/23/20 0659 01/24/20 0638  WBC 2.7* 2.0* 1.9* 1.5* 1.7*  NEUTROABS 1.7  --   --  0.6* 0.7*  HGB 14.6 13.2 13.8 13.7 13.9  HCT 43.9 40.0 42.1 41.2 41.6  MCV 94.2 95.0 95.7 94.7 94.3  PLT 210 178 160 136* 131*   Cardiac Enzymes: No results for input(s): CKTOTAL, CKMB, CKMBINDEX, TROPONINI in the last 168 hours. BNP: Invalid input(s): POCBNP CBG: Recent Labs  Lab 01/23/20 1124 01/23/20 1620 01/23/20 2059 01/24/20 0802 01/24/20 1123  GLUCAP 101* 116* 130* 90 92   HbA1C: No results for input(s): HGBA1C in the last 72 hours. Urine analysis:    Component Value Date/Time   COLORURINE YELLOW 02/25/2019 1043   APPEARANCEUR CLEAR 02/25/2019 1043   LABSPEC 1.019 02/25/2019 1043   PHURINE 5.0 02/25/2019  1043   GLUCOSEU NEGATIVE 02/25/2019 1043   HGBUR NEGATIVE 02/25/2019 1043   [place] 02/25/2019 1043   KETONESUR NEGATIVE 02/25/2019 1043   PROTEINUR NEGATIVE 02/25/2019 1043   NITRITE NEGATIVE 02/25/2019 1043   LEUKOCYTESUR NEGATIVE 02/25/2019 1043   Sepsis Labs: @LABRCNTIP (procalcitonin:4,lacticidven:4) ) Recent Results (from the past 240 hour(s))  Respiratory Panel by RT PCR (Flu A&B, Covid) - Nasopharyngeal Swab     Status: None   Collection Time: 01/20/20  4:31 PM   Specimen: Nasopharyngeal Swab  Result Value Ref Range Status   SARS Coronavirus 2 by RT PCR NEGATIVE NEGATIVE Final    Comment: (NOTE) SARS-CoV-2 target nucleic acids are NOT DETECTED. The SARS-CoV-2 RNA is generally detectable in upper respiratoy specimens during the acute phase of infection. The lowest concentration of SARS-CoV-2 viral copies this assay can detect is 131 copies/mL. A negative result does not preclude SARS-Cov-2 infection and should not be used as the sole basis for treatment or other patient management decisions. A negative result may occur with  improper specimen collection/handling, submission of specimen other than nasopharyngeal swab, presence of viral mutation(s) within the areas targeted by this assay, and inadequate number of viral copies (<131 copies/mL). A negative result must be combined with clinical observations, patient history, and epidemiological information. The expected result is Negative. Fact Sheet for Patients:  PinkCheek.be Fact Sheet for  Healthcare Providers:  GravelBags.it This test is not yet ap proved or cleared by the [place] and  has been authorized for detection and/or diagnosis of SARS-CoV-2 by FDA under an Emergency Use Authorization (EUA). This EUA will remain  in effect (meaning this test can be used) for the duration of the COVID-19 declaration under Section 564(b)(1) of the Act,  21 U.S.C. section 360bbb-3(b)(1), unless the authorization is terminated or revoked sooner.    Influenza A by PCR NEGATIVE NEGATIVE Final   Influenza B by PCR NEGATIVE NEGATIVE Final    Comment: (NOTE) The Xpert Xpress SARS-CoV-2/FLU/RSV assay is intended as an aid in  the diagnosis of influenza from Nasopharyngeal swab specimens and  should not be used as a sole basis for treatment. Nasal washings and  aspirates are unacceptable for Xpert Xpress SARS-CoV-2/FLU/RSV  testing. Fact Sheet for Patients: PinkCheek.be Fact Sheet for Healthcare Providers: GravelBags.it This test is not yet approved or cleared by the [place] FDA and  has been authorized for detection and/or diagnosis of SARS-CoV-2 by  FDA under an Emergency Use Authorization (EUA). This EUA will remain  in effect (meaning this test can be used) for the duration of the  Covid-19 declaration under Section 564(b)(1) of the Act, 21  U.S.C. section 360bbb-3(b)(1), unless the authorization is  terminated or revoked. Performed at [facility], [address] [place], [place] [address]   Blood culture (routine x 2)     Status: None (Preliminary result)   Collection Time: 01/20/20  5:28 PM   Specimen: Right Antecubital; Blood  Result Value Ref Range Status   Specimen Description RIGHT ANTECUBITAL  Final   Special Requests   Final    BOTTLES DRAWN AEROBIC AND ANAEROBIC Blood Culture adequate volume   Culture   Final    NO GROWTH 4 DAYS Performed at [facility], [address], [place], [place] [address]    Report Status PENDING  Incomplete  Blood culture (routine x 2)     Status: None (Preliminary result)   Collection Time: 01/20/20  5:28 PM   Specimen: Right Antecubital; Blood  Result Value Ref Range Status   Specimen Description RIGHT ANTECUBITAL  Final   Special Requests   Final    BOTTLES DRAWN AEROBIC AND ANAEROBIC Blood Culture adequate volume   Culture    Final    NO GROWTH 4 DAYS Performed at [facility], [address], [place], [place] [address]    Report Status PENDING  Incomplete  Respiratory Panel by PCR     Status: None   Collection Time: 01/21/20 11:00 AM   Specimen: Nasopharyngeal Swab; Respiratory  Result Value Ref Range Status   Adenovirus NOT DETECTED NOT DETECTED Final   Coronavirus 229E NOT DETECTED NOT DETECTED Final    Comment: (NOTE) The Coronavirus on the Respiratory Panel, DOES NOT test for the novel  Coronavirus (2019 nCoV)    Coronavirus HKU1 NOT DETECTED NOT DETECTED Final   Coronavirus NL63 NOT DETECTED NOT DETECTED Final   Coronavirus OC43 NOT DETECTED NOT DETECTED Final   Metapneumovirus NOT DETECTED NOT DETECTED Final   Rhinovirus / Enterovirus NOT DETECTED NOT DETECTED Final   Influenza A NOT DETECTED NOT DETECTED Final   Influenza B NOT DETECTED NOT DETECTED Final   Parainfluenza Virus 1 NOT DETECTED NOT DETECTED Final   Parainfluenza Virus 2 NOT DETECTED NOT DETECTED Final   Parainfluenza Virus 3 NOT DETECTED NOT DETECTED Final   Parainfluenza Virus 4 NOT DETECTED NOT DETECTED Final  Respiratory Syncytial Virus NOT DETECTED NOT DETECTED Final   Bordetella pertussis NOT DETECTED NOT DETECTED Final   Chlamydophila pneumoniae NOT DETECTED NOT DETECTED Final   Mycoplasma pneumoniae NOT DETECTED NOT DETECTED Final    Comment: Performed at [facility], [place] [address], [place], [place] [address]     Scheduled Meds: . arformoterol  15 mcg Nebulization BID  . aspirin EC  81 mg Oral Daily  . budesonide (PULMICORT) nebulizer solution  0.5 mg Nebulization BID  . cyanocobalamin  1,000 mcg Intramuscular Daily  . enoxaparin (LOVENOX) injection  40 mg Subcutaneous Q24H  . ezetimibe  10 mg Oral QHS  . ipratropium-albuterol  3 mL Nebulization Q6H WA  . metoprolol tartrate  12.5 mg Oral BID  . pantoprazole  40 mg Oral BID  . tamsulosin  0.8 mg Oral QPC supper  . triamcinolone cream   Topical BID    Continuous Infusions: . cefTRIAXone (ROCEPHIN)  IV 1 g (01/23/20 1858)  . doxycycline (VIBRAMYCIN) IV 100 mg (01/24/20 0939)    Procedures/Studies: CT CHEST WO CONTRAST  Result Date: 01/21/2020 CLINICAL DATA:  Inpatient. COPD exacerbation. Persistent dyspnea and hypoxia. EXAM: CT CHEST WITHOUT CONTRAST TECHNIQUE: Multidetector CT imaging of the chest was performed following the standard protocol without IV contrast. COMPARISON:  Chest radiograph from one day prior. 10/26/2019 chest CT. FINDINGS: Cardiovascular: Normal heart size. No significant pericardial effusion/thickening. Three-vessel coronary atherosclerosis status post CABG. Atherosclerotic nonaneurysmal thoracic aorta. Normal caliber pulmonary arteries. Mediastinum/Nodes: No discrete thyroid nodules. Unremarkable esophagus. No pathologically enlarged axillary, mediastinal or hilar lymph nodes, noting limited sensitivity for the detection of hilar adenopathy on this noncontrast study. Lungs/Pleura: No pneumothorax. No pleural effusion. Moderate to severe centrilobular and paraseptal emphysema. Platelike scarring versus atelectasis in the lower lobes bilaterally. No acute consolidative airspace disease, lung masses or significant pulmonary nodules. Stable mild biapical pleural-parenchymal scarring. Upper abdomen: Small hiatal hernia. Diffuse hepatic steatosis. Stable 1.6 cm left adrenal adenoma with density -4 HU. Musculoskeletal: No aggressive appearing focal osseous lesions. Mild thoracic spondylosis. IMPRESSION: 1. Platelike scarring versus atelectasis in the lower lobes bilaterally. 2. Moderate to severe centrilobular and paraseptal emphysema. 3. Small hiatal hernia. 4. Diffuse hepatic steatosis. 5. Stable left adrenal adenoma. 6. Aortic Atherosclerosis (ICD10-I70.0) and Emphysema (ICD10-J43.9). Electronically Signed   By: Ilona Sorrel M.D.   On: 01/21/2020 20:26   DG Chest Portable 1 View  Result Date: 01/20/2020 CLINICAL DATA:  Pt c/o  shortness of breath and fevers EXAM: PORTABLE CHEST - 1 VIEW COMPARISON:  06/24/2019 FINDINGS: Atelectasis/consolidation the left lung base, increased since previous. Right lung clear. Mild blunting of the left lateral costophrenic angle. Heart size and mediastinal contours are within normal limits. Previous CABG. No pneumothorax. Visualized bones unremarkable. IMPRESSION: Worsening left lower lobe atelectasis/consolidation with possible small effusion. Electronically Signed   By: Lucrezia Europe M.D.   On: 01/20/2020 16:23   ECHOCARDIOGRAM COMPLETE  Result Date: 01/21/2020    ECHOCARDIOGRAM REPORT   Patient Name:   ANSHUMAN ALAMILLO Date of Exam: 01/21/2020 Medical Rec #:  [id number]  Height:       71.5 in Accession #:    [id number] Weight:       187.0 lb Date of Birth:  October 10, 1940   BSA:          2.060 m Patient Age:    49 years   BP:           113/77 mmHg Patient Gender: M  HR:           89 bpm. Exam Location:  [facility] Procedure: 2D Echo Indications:    Dyspnea 786.09 / R06.00  History:        Patient has prior history of Echocardiogram examinations, most                 recent 10/23/2018. CAD, Prior CABG, COPD; Risk Factors:Former                 Smoker.  Sonographer:    Leavy Cella RDCS (AE) Referring Phys: [place]  1. Left ventricular ejection fraction, by estimation, is 60 to 65%. The left ventricle has normal function. The left ventricle has no regional wall motion abnormalities. Left ventricular diastolic parameters are consistent with Grade I diastolic dysfunction (impaired relaxation).  2. Right ventricular systolic function is normal. The right ventricular size is normal. Tricuspid regurgitation signal is inadequate for assessing PA pressure.  3. The mitral valve is grossly normal. Trivial mitral valve regurgitation.  4. The aortic valve is tricuspid. Aortic valve regurgitation is not visualized.  5. The inferior vena cava is normal in size with greater than 50% respiratory  variability, suggesting right atrial pressure of 3 mmHg. FINDINGS  Left Ventricle: Left ventricular ejection fraction, by estimation, is 60 to 65%. The left ventricle has normal function. The left ventricle has no regional wall motion abnormalities. The left ventricular internal cavity size was normal in size. There is  no left ventricular hypertrophy. Left ventricular diastolic parameters are consistent with Grade I diastolic dysfunction (impaired relaxation). Right Ventricle: The right ventricular size is normal. No increase in right ventricular wall thickness. Right ventricular systolic function is normal. Tricuspid regurgitation signal is inadequate for assessing PA pressure. Left Atrium: Left atrial size was normal in size. Right Atrium: Right atrial size was normal in size. Pericardium: There is no evidence of pericardial effusion. Mitral Valve: The mitral valve is grossly normal. Trivial mitral valve regurgitation. Tricuspid Valve: The tricuspid valve is grossly normal. Tricuspid valve regurgitation is trivial. Aortic Valve: The aortic valve is tricuspid. Aortic valve regurgitation is not visualized. Mild aortic valve annular calcification. Pulmonic Valve: The pulmonic valve was grossly normal. Pulmonic valve regurgitation is trivial. Aorta: The aortic root is normal in size and structure. Venous: The inferior vena cava is normal in size with greater than 50% respiratory variability, suggesting right atrial pressure of 3 mmHg. IAS/Shunts: No atrial level shunt detected by color flow Doppler. Rozann Lesches MD Electronically signed by Rozann Lesches MD Signature Date/Time: 01/21/2020/11:18:38 AM    Final     Orson Eva, DO  [facility]  If 7PM-7AM, please contact night-coverage [email or web address] Password [email or web address] 01/24/2020, 4:07 PM   LOS: 4 days
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
## 2020-01-24 NOTE — Plan of Care (Signed)
  Problem: Acute Rehab PT Goals(only PT should resolve) Goal: Pt Will Go Supine/Side To Sit Outcome: Progressing Flowsheets (Taken 01/24/2020 1419) Pt will go Supine/Side to Sit: with modified independence Goal: Patient Will Transfer Sit To/From Stand Outcome: Progressing Flowsheets (Taken 01/24/2020 1419) Patient will transfer sit to/from stand: with supervision Goal: Pt Will Transfer Bed To Chair/Chair To Bed Outcome: Progressing Flowsheets (Taken 01/24/2020 1419) Pt will Transfer Bed to Chair/Chair to Bed: with supervision Goal: Pt Will Ambulate Outcome: Progressing Flowsheets (Taken 01/24/2020 1419) Pt will Ambulate:  75 feet  with supervision  with rolling walker   2:20 PM, 01/24/20 Lonell Grandchild, MPT Physical Therapist with [phone] office [phone] mobile phone

## 2020-01-25 ENCOUNTER — Inpatient Hospital Stay (HOSPITAL_COMMUNITY): Payer: Medicare Other

## 2020-01-25 DIAGNOSIS — D72819 Decreased white blood cell count, unspecified: Secondary | ICD-10-CM

## 2020-01-25 DIAGNOSIS — R7401 Elevation of levels of liver transaminase levels: Secondary | ICD-10-CM

## 2020-01-25 LAB — CULTURE, BLOOD (ROUTINE X 2)
Culture: NO GROWTH
Culture: NO GROWTH
Special Requests: ADEQUATE
Special Requests: ADEQUATE

## 2020-01-25 LAB — CBC WITH DIFFERENTIAL/PLATELET
Abs Immature Granulocytes: 0.01 10*3/uL (ref 0.00–0.07)
Basophils Absolute: 0 10*3/uL (ref 0.0–0.1)
Basophils Relative: 1 %
Eosinophils Absolute: 0.1 10*3/uL (ref 0.0–0.5)
Eosinophils Relative: 3 %
HCT: 43.4 % (ref 39.0–52.0)
Hemoglobin: 14.5 g/dL (ref 13.0–17.0)
Immature Granulocytes: 1 %
Lymphocytes Relative: 48 %
Lymphs Abs: 0.9 10*3/uL (ref 0.7–4.0)
MCH: 31.5 pg (ref 26.0–34.0)
MCHC: 33.4 g/dL (ref 30.0–36.0)
MCV: 94.1 fL (ref 80.0–100.0)
Monocytes Absolute: 0.2 10*3/uL (ref 0.1–1.0)
Monocytes Relative: 11 %
Neutro Abs: 0.7 10*3/uL — ABNORMAL LOW (ref 1.7–7.7)
Neutrophils Relative %: 36 %
Platelets: 139 10*3/uL — ABNORMAL LOW (ref 150–400)
RBC: 4.61 MIL/uL (ref 4.22–5.81)
RDW: 14.1 % (ref 11.5–15.5)
WBC: 1.9 10*3/uL — ABNORMAL LOW (ref 4.0–10.5)
nRBC: 0 % (ref 0.0–0.2)

## 2020-01-25 LAB — COMPREHENSIVE METABOLIC PANEL
ALT: 117 U/L — ABNORMAL HIGH (ref 0–44)
AST: 114 U/L — ABNORMAL HIGH (ref 15–41)
Albumin: 3.4 g/dL — ABNORMAL LOW (ref 3.5–5.0)
Alkaline Phosphatase: 44 U/L (ref 38–126)
Anion gap: 9 (ref 5–15)
BUN: 12 mg/dL (ref 8–23)
CO2: 24 mmol/L (ref 22–32)
Calcium: 8.2 mg/dL — ABNORMAL LOW (ref 8.9–10.3)
Chloride: 106 mmol/L (ref 98–111)
Creatinine, Ser: 0.8 mg/dL (ref 0.61–1.24)
GFR calc Af Amer: >60 mL/min (ref >60)
GFR calc non Af Amer: >60 mL/min (ref >60)
Glucose, Bld: 99 mg/dL (ref 70–99)
Potassium: 3.8 mmol/L (ref 3.5–5.1)
Sodium: 139 mmol/L (ref 135–145)
Total Bilirubin: 0.9 mg/dL (ref 0.3–1.2)
Total Protein: 5.9 g/dL — ABNORMAL LOW (ref 6.5–8.1)

## 2020-01-25 LAB — RMSF, IGG, IFA: RMSF, IGG, IFA: 1:64 {titer} — ABNORMAL HIGH

## 2020-01-25 LAB — MAGNESIUM: Magnesium: 2.2 mg/dL (ref 1.7–2.4)

## 2020-01-25 LAB — HEPATITIS C ANTIBODY: HCV Ab: NONREACTIVE

## 2020-01-25 LAB — MYCOPLASMA PNEUMONIAE ANTIBODY, IGM: Mycoplasma pneumo IgM: <770 U/mL (ref 0–769)

## 2020-01-25 LAB — LACTATE DEHYDROGENASE: LDH: 230 U/L — ABNORMAL HIGH (ref 98–192)

## 2020-01-25 LAB — ROCKY MTN SPOTTED FVR ABS PNL(IGG+IGM)
RMSF IgG: POSITIVE — AB
RMSF IgM: 0.16 index (ref 0.00–0.89)

## 2020-01-25 LAB — CMV DNA, QUANTITATIVE, PCR
CMV DNA Quant: NEGATIVE IU/mL
Log10 CMV Qn DNA Pl: UNDETERMINED log10 IU/mL

## 2020-01-25 LAB — HEPATITIS B SURFACE ANTIGEN: Hepatitis B Surface Ag: NONREACTIVE

## 2020-01-25 MED ORDER — IPRATROPIUM-ALBUTEROL 0.5-2.5 (3) MG/3ML IN SOLN
3.0000 mL | Freq: Three times a day (TID) | RESPIRATORY_TRACT | Status: DC
  Administered 2020-01-25: 3 mL via RESPIRATORY_TRACT
  Filled 2020-01-25: qty 3

## 2020-01-25 MED ORDER — DOXYCYCLINE HYCLATE 100 MG PO TABS
100.0000 mg | ORAL_TABLET | Freq: Two times a day (BID) | ORAL | Status: DC
  Administered 2020-01-25: 100 mg via ORAL
  Filled 2020-01-25: qty 1

## 2020-01-25 MED ORDER — IPRATROPIUM-ALBUTEROL 0.5-2.5 (3) MG/3ML IN SOLN: 3.0000 mL | Freq: Two times a day (BID) | RESPIRATORY_TRACT | Status: DC

## 2020-01-25 MED ORDER — VITAMIN B-12 1000 MCG PO TABS: 500.0000 ug | ORAL_TABLET | Freq: Every day | ORAL | Status: DC

## 2020-01-25 MED ORDER — IPRATROPIUM-ALBUTEROL 0.5-2.5 (3) MG/3ML IN SOLN: 3.0000 mL | Freq: Two times a day (BID) | RESPIRATORY_TRACT | 1 refills | Status: DC

## 2020-01-25 MED ORDER — DOXYCYCLINE HYCLATE 100 MG PO TABS: 100.0000 mg | ORAL_TABLET | Freq: Two times a day (BID) | ORAL | 0 refills | Status: DC

## 2020-01-25 MED ORDER — CYANOCOBALAMIN 500 MCG PO TABS: 500.0000 ug | ORAL_TABLET | Freq: Every day | ORAL | 1 refills | Status: DC

## 2020-01-25 NOTE — Progress Notes (Signed)
PT Cancellation Note  Patient Details Name: Benjamin [name] MRN: [id number] DOB: 05-31-1941   Cancelled Treatment:    Reason Eval/Treat Not Completed: Patient at procedure or test/unavailable. Pt down for [place] at this time; will check back as schedule permits.    Tori Pearlie Nies PT, DPT 01/25/20, 11:14 AM

## 2020-01-25 NOTE — TOC Transition Note (Signed)
Transition of Care [facility]) - CM/SW Discharge Note   Patient Details  Name: Benjamin [name] MRN: [id number] Date of Birth: [birth year]  [facility]) CM/SW Contact:  Boneta Lucks, RN Phone Number: 01/25/2020, 12:53 PM   Clinical Narrative:   Patient admitted with Pneumonia. PT is recommending HHPT.  Choices given, TOC pairing to get [facility] to accept the referral.     Final next level of care: Home w Home Health Services Barriers to Discharge: Barriers Resolved   Patient Goals and CMS Choice Patient states their goals for this hospitalization and ongoing recovery are:: to go home with hospice CMS Medicare.gov Compare Post Acute Care list provided to:: Patient    Discharge Plan and Services          HH Arranged: PT [facility] Agency: [facility] Date [place]: 01/25/20 Time [place]: 1253 Representative spoke with at [place]

## 2020-01-25 NOTE — Progress Notes (Signed)
IV an tele removed. D/C instructions reviewed with patient, verbalized understanding. To be transported to private vehicle via wheelchair, will continue to monitor.

## 2020-01-26 DIAGNOSIS — Z951 Presence of aortocoronary bypass graft: Secondary | ICD-10-CM | POA: Diagnosis not present

## 2020-01-26 DIAGNOSIS — J181 Lobar pneumonia, unspecified organism: Secondary | ICD-10-CM | POA: Diagnosis not present

## 2020-01-26 DIAGNOSIS — M6281 Muscle weakness (generalized): Secondary | ICD-10-CM | POA: Diagnosis not present

## 2020-01-26 DIAGNOSIS — I2510 Atherosclerotic heart disease of native coronary artery without angina pectoris: Secondary | ICD-10-CM | POA: Diagnosis not present

## 2020-01-26 DIAGNOSIS — I110 Hypertensive heart disease with heart failure: Secondary | ICD-10-CM | POA: Diagnosis not present

## 2020-01-26 DIAGNOSIS — I5032 Chronic diastolic (congestive) heart failure: Secondary | ICD-10-CM | POA: Diagnosis not present

## 2020-01-26 DIAGNOSIS — J440 Chronic obstructive pulmonary disease with acute lower respiratory infection: Secondary | ICD-10-CM | POA: Diagnosis not present

## 2020-01-26 LAB — EPSTEIN BARR VRS(EBV DNA BY PCR)
EBV DNA QN by PCR: NEGATIVE copies/mL
log10 EBV DNA Qn PCR: UNDETERMINED log10 copy/mL

## 2020-01-27 ENCOUNTER — Other Ambulatory Visit: Payer: Self-pay

## 2020-01-27 ENCOUNTER — Ambulatory Visit (INDEPENDENT_AMBULATORY_CARE_PROVIDER_SITE_OTHER): Payer: Medicare Other | Admitting: Internal Medicine

## 2020-01-27 ENCOUNTER — Encounter: Payer: Self-pay | Admitting: Internal Medicine

## 2020-01-27 VITALS — BP 102/74 | HR 64 | Ht 71.0 in | Wt 184.6 lb

## 2020-01-27 DIAGNOSIS — T466X5A Adverse effect of antihyperlipidemic and antiarteriosclerotic drugs, initial encounter: Secondary | ICD-10-CM

## 2020-01-27 DIAGNOSIS — I2510 Atherosclerotic heart disease of native coronary artery without angina pectoris: Secondary | ICD-10-CM | POA: Diagnosis not present

## 2020-01-27 DIAGNOSIS — E785 Hyperlipidemia, unspecified: Secondary | ICD-10-CM | POA: Diagnosis not present

## 2020-01-27 DIAGNOSIS — M791 Myalgia, unspecified site: Secondary | ICD-10-CM

## 2020-01-27 LAB — LEGIONELLA PNEUMOPHILA SEROGP 1 UR AG: L. pneumophila Serogp 1 Ur Ag: NEGATIVE

## 2020-01-27 LAB — COPPER, SERUM: Copper: 83 ug/dL (ref 69–132)

## 2020-01-27 NOTE — Patient Instructions (Signed)
Medication Instructions:  Your physician has recommended you make the following change in your medication: - STOP zetia - Continue all other current medications  *If you need a refill on your cardiac medications before your next appointment, please call your pharmacy*   Lab Work: FASTING lab work in 1 year to check cholesterol before your next lipid clinic appointment  If you have labs (blood work) drawn today and your tests are completely normal, you will receive your results only by: Marland Kitchen MyChart Message (if you have MyChart) OR . A paper copy in the mail If you have any lab test that is abnormal or we need to change your treatment, we will call you to review the results.   Testing/Procedures: NONE   Follow-Up: At [facility], you and your health needs are our priority.  As part of our continuing mission to provide you with exceptional heart care, we have created designated Provider Care Teams.  These Care Teams include your primary Cardiologist (physician) and Advanced Practice Providers (APPs -  Physician Assistants and Nurse Practitioners) who all work together to provide you with the care you need, when you need it.  We recommend signing up for the patient portal called "MyChart".  Sign up information is provided on this After Visit Summary.  MyChart is used to connect with patients for Virtual Visits (Telemedicine).  Patients are able to view lab/test results, encounter notes, upcoming appointments, etc.  Non-urgent messages can be sent to your provider as well.   To learn more about what you can do with MyChart, go to [email or web address].    Your next appointment:   12 month(s) - lipid clinic  The format for your next appointment:   In Person  Provider:   K. Mali Hilty, MD   Other Instructions
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
## 2020-01-30 LAB — METHYLMALONIC ACID, SERUM: Methylmalonic Acid, Quantitative: 392 nmol/L — ABNORMAL HIGH (ref 0–378)

## 2020-02-01 ENCOUNTER — Inpatient Hospital Stay (HOSPITAL_COMMUNITY): Payer: Medicare Other

## 2020-02-01 ENCOUNTER — Other Ambulatory Visit: Payer: Self-pay

## 2020-02-01 ENCOUNTER — Inpatient Hospital Stay (HOSPITAL_COMMUNITY): Payer: Medicare Other | Attending: Hematology | Admitting: Hematology

## 2020-02-01 ENCOUNTER — Other Ambulatory Visit (HOSPITAL_COMMUNITY): Payer: Self-pay | Admitting: *Deleted

## 2020-02-01 VITALS — BP 99/71 | HR 74 | Temp 97.1°F | Resp 18 | Wt 187.0 lb

## 2020-02-01 DIAGNOSIS — R748 Abnormal levels of other serum enzymes: Secondary | ICD-10-CM

## 2020-02-01 DIAGNOSIS — D72819 Decreased white blood cell count, unspecified: Secondary | ICD-10-CM | POA: Diagnosis not present

## 2020-02-01 DIAGNOSIS — E538 Deficiency of other specified B group vitamins: Secondary | ICD-10-CM | POA: Diagnosis not present

## 2020-02-01 LAB — CBC WITH DIFFERENTIAL/PLATELET
Abs Immature Granulocytes: 0.02 10*3/uL (ref 0.00–0.07)
Basophils Absolute: 0.1 10*3/uL (ref 0.0–0.1)
Basophils Relative: 1 %
Eosinophils Absolute: 0.2 10*3/uL (ref 0.0–0.5)
Eosinophils Relative: 4 %
HCT: 44.3 % (ref 39.0–52.0)
Hemoglobin: 15.1 g/dL (ref 13.0–17.0)
Immature Granulocytes: 0 %
Lymphocytes Relative: 28 %
Lymphs Abs: 1.7 10*3/uL (ref 0.7–4.0)
MCH: 32.3 pg (ref 26.0–34.0)
MCHC: 34.1 g/dL (ref 30.0–36.0)
MCV: 94.7 fL (ref 80.0–100.0)
Monocytes Absolute: 0.7 10*3/uL (ref 0.1–1.0)
Monocytes Relative: 11 %
Neutro Abs: 3.3 10*3/uL (ref 1.7–7.7)
Neutrophils Relative %: 56 %
Platelets: 371 10*3/uL (ref 150–400)
RBC: 4.68 MIL/uL (ref 4.22–5.81)
RDW: 14.1 % (ref 11.5–15.5)
WBC: 6 10*3/uL (ref 4.0–10.5)
nRBC: 0 % (ref 0.0–0.2)

## 2020-02-01 LAB — HEPATIC FUNCTION PANEL
ALT: 61 U/L — ABNORMAL HIGH (ref 0–44)
AST: 31 U/L (ref 15–41)
Albumin: 3.9 g/dL (ref 3.5–5.0)
Alkaline Phosphatase: 48 U/L (ref 38–126)
Bilirubin, Direct: 0.2 mg/dL (ref 0.0–0.2)
Indirect Bilirubin: 1.7 mg/dL — ABNORMAL HIGH (ref 0.3–0.9)
Total Bilirubin: 1.9 mg/dL — ABNORMAL HIGH (ref 0.3–1.2)
Total Protein: 6.7 g/dL (ref 6.5–8.1)

## 2020-02-01 LAB — LACTATE DEHYDROGENASE: LDH: 166 U/L (ref 98–192)

## 2020-02-01 MED ORDER — CYANOCOBALAMIN 1000 MCG/ML IJ SOLN
1000.0000 ug | Freq: Once | INTRAMUSCULAR | Status: AC
  Administered 2020-02-01: 1000 ug via INTRAMUSCULAR
  Filled 2020-02-01: qty 1

## 2020-02-01 NOTE — Assessment & Plan Note (Addendum)
1.  Resolved neutropenia: -Recent hospitalization from 01/20/2020 through 12/26/2019 with severe tiredness.  RMSF antibody was positive.  He was treated with doxycycline.  He was found to have severely low white count with a low neutrophil count.  Nutritional deficiency work-up showed borderline B12 and elevated methylmalonic acid. -He is currently taking B12 sublingual.  Mild thrombocytopenia has resolved. -He has completed 4 more days of doxycycline since discharge. -He reports continued weakness and tiredness. -I have repeated his CBC today which showed white count of 6 hemoglobin 15.1 and platelet count 371.  Differential showed 56% neutrophils and 28% lymphocytes and 11% monocytes and 4% eosinophils. -LFTs show elevated total bilirubin of 1.9 with indirect bilirubin of 1.7.  Elevated LFTs have improved although ALT is slightly elevated at 61.  AST is normal.  LDH is normal at 166. -Ultrasound of the abdomen on 01/25/2020 shows increased echogenicity of the liver consistent with fatty infiltration with no obvious liver lesions. -Would recommend checking his LFTs and CBC in 2 to 3 weeks.  I do not believe any imaging is necessary at this time.  2.  B12 deficiency: -His B12 was borderline during recent hospital admission.  Methylmalonic acid was elevated. -He was taking sublingual B12.  His energy levels have not improved. -We will give 1 mg B12 injection today.

## 2020-02-01 NOTE — Progress Notes (Signed)
Patient tolerated injection with no complaints voiced.  Site clean and dry with no bruising or swelling noted at site.  Band aid applied.  Vss with discharge and left ambulatory with no s/s of distress noted.  

## 2020-02-01 NOTE — Patient Instructions (Addendum)
[name] at [facility] Discharge Instructions  You were seen today by Dr. Delton Coombes. He went over your recent results: Your platelets, white blood cells, and absolute neutrophils have returned to normal. Your Vitamin B12 is still low and you received an injection today. You tested positive for [facility] Spotted Fever while in the hospital. Dr. Delton Coombes will see you back in for labs and follow up.   Thank you for choosing [place] at [facility] to provide your oncology and hematology care.  To afford each patient quality time with our provider, please arrive at least 15 minutes before your scheduled appointment time.   If you have a lab appointment with the [facility] please come in thru the  Main Entrance and check in at the main information desk  You need to re-schedule your appointment should you arrive 10 or more minutes late.  We strive to give you quality time with our providers, and arriving late affects you and other patients whose appointments are after yours.  Also, if you no show three or more times for appointments you may be dismissed from the clinic at the providers discretion.     Again, thank you for choosing [facility].  Our hope is that these requests will decrease the amount of time that you wait before being seen by our physicians.       _____________________________________________________________  Should you have questions after your visit to [facility] please contact our office at [phone] between the hours of 8:00 a.m. and 4:30 p.m.  Voicemails left after 4:00 p.m. will not be returned until the following business day.  For prescription refill requests, have your pharmacy contact our office and allow 72 hours.    Cancer Center Support Programs:   > Cancer Support Group  2nd Tuesday of the month 1pm-2pm, Journey Room
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
## 2020-02-02 DIAGNOSIS — I5032 Chronic diastolic (congestive) heart failure: Secondary | ICD-10-CM | POA: Diagnosis not present

## 2020-02-02 DIAGNOSIS — I110 Hypertensive heart disease with heart failure: Secondary | ICD-10-CM | POA: Diagnosis not present

## 2020-02-02 DIAGNOSIS — J181 Lobar pneumonia, unspecified organism: Secondary | ICD-10-CM | POA: Diagnosis not present

## 2020-02-02 DIAGNOSIS — M6281 Muscle weakness (generalized): Secondary | ICD-10-CM | POA: Diagnosis not present

## 2020-02-02 DIAGNOSIS — J440 Chronic obstructive pulmonary disease with acute lower respiratory infection: Secondary | ICD-10-CM | POA: Diagnosis not present

## 2020-02-02 DIAGNOSIS — I2510 Atherosclerotic heart disease of native coronary artery without angina pectoris: Secondary | ICD-10-CM | POA: Diagnosis not present

## 2020-02-11 ENCOUNTER — Other Ambulatory Visit: Payer: Self-pay

## 2020-02-11 ENCOUNTER — Encounter: Payer: Self-pay | Admitting: Family Medicine

## 2020-02-11 ENCOUNTER — Ambulatory Visit (INDEPENDENT_AMBULATORY_CARE_PROVIDER_SITE_OTHER): Payer: Medicare Other | Admitting: Family Medicine

## 2020-02-11 VITALS — BP 100/60 | HR 87 | Temp 97.8°F | Resp 15 | Ht 71.0 in | Wt 184.1 lb

## 2020-02-11 DIAGNOSIS — G894 Chronic pain syndrome: Secondary | ICD-10-CM

## 2020-02-11 DIAGNOSIS — I2510 Atherosclerotic heart disease of native coronary artery without angina pectoris: Secondary | ICD-10-CM

## 2020-02-11 DIAGNOSIS — D72819 Decreased white blood cell count, unspecified: Secondary | ICD-10-CM

## 2020-02-11 DIAGNOSIS — J410 Simple chronic bronchitis: Secondary | ICD-10-CM | POA: Diagnosis not present

## 2020-02-11 DIAGNOSIS — A770 Spotted fever due to Rickettsia rickettsii: Secondary | ICD-10-CM | POA: Diagnosis not present

## 2020-02-11 DIAGNOSIS — R5383 Other fatigue: Secondary | ICD-10-CM

## 2020-02-11 MED ORDER — DOXYCYCLINE HYCLATE 100 MG PO TABS: 100.0000 mg | ORAL_TABLET | Freq: Two times a day (BID) | ORAL | 0 refills | Status: DC

## 2020-02-11 NOTE — Progress Notes (Signed)
Subjective:    Patient ID: Benjamin [name], male    DOB: [birth year], 79 y.o.   MRN: [id number]  Patient presents for Hospitalization Follow-up   Pt here for hospital follow up.    Admitted with Hypoxia and respiratory failure and treated for presumptive pneumonia and COPD.  He had a chest x-ray that initially was concerning for right-sided pneumonia however CT of chest did not show any infection.  He also did not have any fluid overload.  He had multiple labs drawn in the setting of the fatigue associated he also had leukopenia.  He recalled having 2 ticks removed off of his legs prior to the admission.  He had tick titers done which were positive for [facility] spotted fever.  Appears he was given doxycycline for 7 days.  He continues have significant fatigue and still has tenderness at the spots of the tics.  He does not feel like he is improving at all.  He still gets short of breath with little exertion but denies any chest pain no cough or congestion.  They have been checking his oxygen saturation has been staying above 92% at home    b12 DEF-  being followed by hematology for that as well as a leukopenia.  He was given B12 injection in the hospital and one at the office recently.  He is scheduled to follow-up on the 15th for another injection and repeat labs.  He had transient transaminitis which was thought to be secondary to acetaminophen use.  He did have ultrasound done but there is no acute cause for the elevated enzymes.  He is now trying to keep his Tylenol less than 2 g a day  June 15th- B12 is low , given a shot   Chronic pain- taking tylenol , he is no longer on diluadid and trying to avoid taking the Dilaudid because of addiction potential.  His appetite has been slowly improving his weight is down up to 184 pounds  Review Of Systems:  GEN- + fatigue, fever, weight loss,weakness, recent illness HEENT- denies eye drainage, change in vision, nasal discharge, CVS- denies chest  pain, palpitations RESP- + SOB, denies cough, wheeze ABD- denies N/V, change in stools, abd pain GU- denies dysuria, hematuria, dribbling, incontinence MSK- denies joint pain, +muscle aches, injury Neuro- denies headache, dizziness, syncope, seizure activity       Objective:    BP 100/60   Pulse 87   Temp 97.8 F (36.6 C) (Temporal)   Resp 15   Ht 5\' 11"  (1.803 m)   Wt 184 lb 2 oz (83.5 kg)   SpO2 90%   BMI 25.68 kg/m  GEN- NAD, alert and oriented x3, walking Oxygen sat in clinic  92-95%  HEENT- PERRL, EOMI, non injected sclera, pink conjunctiva, MMM, oropharynx clear Neck- Supple, no thyromegaly CVS- RRR, no murmur RESP-CTAB ABD-NABS,soft,NT,ND EXT- No edema Skin- post left thigh scab with mild erythema and mild induration TTP, Right leg scab with mild erythema No other lesions, palms/soles clear  Pulses- Radial, DP- 2+         Assessment & Plan:    Pt declines PT does not feel it is helpful to him   approx 25 minutes spent with pt > 50% on review of labs/imaging/ decision making Problem List Items Addressed This Visit      Unprioritized   Chronic pain syndrome    He has weaned off dilaudid  Continue tylenol but at reduced dose, no more than 2 grams a  day, due to the elevated LFT   he experienced       COPD (chronic obstructive pulmonary disease) (HCC)    Based on symptoms, was not classic COPD exacerbation, pt at baseline oxygen sat hat baseline He has chronic fatigue/SOB episodes       Leukopenia    Chronic leukopenia ,now with fatigue, positive RMSF With ongoing symptoms, tenderness with mild induration at tick sites Will extend antibiotics another 7 days with doxycycline   F/U hematology as scheduled         Other Visit Diagnoses    Other fatigue    -  Primary   RMSF [facility] spotted fever)          Note: This dictation was prepared with Diplomatic Services operational officer dictation along with smaller Company secretary. Any transcriptional errors that result from  this process are unintentional.

## 2020-02-11 NOTE — Patient Instructions (Addendum)
We will d/c physical therapy  F/U as previous

## 2020-02-12 ENCOUNTER — Encounter: Payer: Self-pay | Admitting: Family Medicine

## 2020-02-12 NOTE — Assessment & Plan Note (Signed)
Based on symptoms, was not classic COPD exacerbation, pt at baseline oxygen sat hat baseline He has chronic fatigue/SOB episodes

## 2020-02-12 NOTE — Assessment & Plan Note (Signed)
He has weaned off dilaudid  Continue tylenol but at reduced dose, no more than 2 grams a day, due to the elevated LFT   he experienced

## 2020-02-12 NOTE — Assessment & Plan Note (Signed)
Chronic leukopenia ,now with fatigue, positive RMSF With ongoing symptoms, tenderness with mild induration at tick sites Will extend antibiotics another 7 days with doxycycline   F/U hematology as scheduled

## 2020-02-15 ENCOUNTER — Telehealth: Payer: Self-pay | Admitting: *Deleted

## 2020-02-15 NOTE — Telephone Encounter (Signed)
-----   Message from Alycia Rossetti, MD sent at 02/12/2020  8:30 AM EDT ----- Regarding: Pt wants to D/C PT, I believe we recieved a form recently

## 2020-02-15 NOTE — Telephone Encounter (Signed)
[facility] Follow up.   Specialty: Home Health Services Why: Home health PT.  Contact information: [place] [phone]  Call placed to [facility] and VO given to [facility] SN Caseworker.

## 2020-02-17 ENCOUNTER — Telehealth: Payer: Self-pay | Admitting: Family Medicine

## 2020-02-17 NOTE — Telephone Encounter (Signed)
#  CB [phone] Pt will be has finish taken antibody tomorrow still not feeling well wasn't sure if he  needs another antibody again

## 2020-02-18 MED ORDER — DOXYCYCLINE HYCLATE 100 MG PO TABS: 100.0000 mg | ORAL_TABLET | Freq: Two times a day (BID) | ORAL | 0 refills | Status: DC

## 2020-02-18 NOTE — Telephone Encounter (Signed)
Call placed to patient to inquire.   Reports that he has had very minimal improvement since starting ABTx. Reports that he was informed he may require 4-6 weeks of ABTx.   MD please advise.

## 2020-02-18 NOTE — Telephone Encounter (Signed)
Please send in a refill of doxycycline 100 mg twice daily for another 7 days.  That will be 21 days of treatment. I want to  see what his hematologist thinks next week before giving him truly 6 weeks of antibiotics for this.  As he does not have the classic symptoms like we discussed in the office.  We may have to get infectious disease involved.

## 2020-02-18 NOTE — Telephone Encounter (Signed)
  This sounds like muscular pain, he likely has some arthritis as well just based on age/PAIN   Tylenol is the safest, he can also use topical rub such as biofreeze/icy hot etc  He can try heating pad 10-15 minutes    Schedule OV if not improved

## 2020-02-18 NOTE — Telephone Encounter (Signed)
Call placed to patient and patient made aware.  

## 2020-02-18 NOTE — Telephone Encounter (Signed)
Call placed to patient and patient made aware.   Prescription sent to pharmacy.   Patient also reports increased pain in neck and spine. Reports that pain does go down to mid back level. He is currently using APAP with little relief.   MD please advise.

## 2020-02-25 DIAGNOSIS — J181 Lobar pneumonia, unspecified organism: Secondary | ICD-10-CM | POA: Diagnosis not present

## 2020-02-29 ENCOUNTER — Inpatient Hospital Stay (HOSPITAL_COMMUNITY): Payer: Medicare Other | Attending: Hematology | Admitting: Hematology

## 2020-02-29 ENCOUNTER — Other Ambulatory Visit: Payer: Self-pay

## 2020-02-29 ENCOUNTER — Inpatient Hospital Stay (HOSPITAL_COMMUNITY): Payer: Medicare Other

## 2020-02-29 VITALS — BP 124/74 | HR 73 | Temp 97.4°F | Resp 18 | Wt 180.4 lb

## 2020-02-29 DIAGNOSIS — Z85828 Personal history of other malignant neoplasm of skin: Secondary | ICD-10-CM | POA: Insufficient documentation

## 2020-02-29 DIAGNOSIS — D72819 Decreased white blood cell count, unspecified: Secondary | ICD-10-CM

## 2020-02-29 DIAGNOSIS — J449 Chronic obstructive pulmonary disease, unspecified: Secondary | ICD-10-CM | POA: Diagnosis not present

## 2020-02-29 DIAGNOSIS — Z87891 Personal history of nicotine dependence: Secondary | ICD-10-CM | POA: Diagnosis not present

## 2020-02-29 DIAGNOSIS — M129 Arthropathy, unspecified: Secondary | ICD-10-CM | POA: Insufficient documentation

## 2020-02-29 DIAGNOSIS — D696 Thrombocytopenia, unspecified: Secondary | ICD-10-CM | POA: Insufficient documentation

## 2020-02-29 DIAGNOSIS — D709 Neutropenia, unspecified: Secondary | ICD-10-CM | POA: Diagnosis not present

## 2020-02-29 DIAGNOSIS — R0602 Shortness of breath: Secondary | ICD-10-CM | POA: Diagnosis not present

## 2020-02-29 DIAGNOSIS — E538 Deficiency of other specified B group vitamins: Secondary | ICD-10-CM | POA: Diagnosis not present

## 2020-02-29 DIAGNOSIS — K219 Gastro-esophageal reflux disease without esophagitis: Secondary | ICD-10-CM | POA: Diagnosis not present

## 2020-02-29 DIAGNOSIS — Z79899 Other long term (current) drug therapy: Secondary | ICD-10-CM | POA: Diagnosis not present

## 2020-02-29 DIAGNOSIS — R42 Dizziness and giddiness: Secondary | ICD-10-CM | POA: Diagnosis not present

## 2020-02-29 LAB — CBC WITH DIFFERENTIAL/PLATELET
Abs Immature Granulocytes: 0.01 10*3/uL (ref 0.00–0.07)
Basophils Absolute: 0.1 10*3/uL (ref 0.0–0.1)
Basophils Relative: 1 %
Eosinophils Absolute: 0.5 10*3/uL (ref 0.0–0.5)
Eosinophils Relative: 8 %
HCT: 44.9 % (ref 39.0–52.0)
Hemoglobin: 14.7 g/dL (ref 13.0–17.0)
Immature Granulocytes: 0 %
Lymphocytes Relative: 26 %
Lymphs Abs: 1.6 10*3/uL (ref 0.7–4.0)
MCH: 32.2 pg (ref 26.0–34.0)
MCHC: 32.7 g/dL (ref 30.0–36.0)
MCV: 98.5 fL (ref 80.0–100.0)
Monocytes Absolute: 0.6 10*3/uL (ref 0.1–1.0)
Monocytes Relative: 10 %
Neutro Abs: 3.3 10*3/uL (ref 1.7–7.7)
Neutrophils Relative %: 55 %
Platelets: 313 10*3/uL (ref 150–400)
RBC: 4.56 MIL/uL (ref 4.22–5.81)
RDW: 13.9 % (ref 11.5–15.5)
WBC: 6.1 10*3/uL (ref 4.0–10.5)
nRBC: 0 % (ref 0.0–0.2)

## 2020-02-29 LAB — LACTATE DEHYDROGENASE: LDH: 126 U/L (ref 98–192)

## 2020-02-29 LAB — COMPREHENSIVE METABOLIC PANEL
ALT: 29 U/L (ref 0–44)
AST: 22 U/L (ref 15–41)
Albumin: 3.8 g/dL (ref 3.5–5.0)
Alkaline Phosphatase: 46 U/L (ref 38–126)
Anion gap: 9 (ref 5–15)
BUN: 19 mg/dL (ref 8–23)
CO2: 24 mmol/L (ref 22–32)
Calcium: 9.2 mg/dL (ref 8.9–10.3)
Chloride: 112 mmol/L — ABNORMAL HIGH (ref 98–111)
Creatinine, Ser: 1.03 mg/dL (ref 0.61–1.24)
GFR calc Af Amer: >60 mL/min (ref >60)
GFR calc non Af Amer: >60 mL/min (ref >60)
Glucose, Bld: 128 mg/dL — ABNORMAL HIGH (ref 70–99)
Potassium: 4 mmol/L (ref 3.5–5.1)
Sodium: 145 mmol/L (ref 135–145)
Total Bilirubin: 1 mg/dL (ref 0.3–1.2)
Total Protein: 6.6 g/dL (ref 6.5–8.1)

## 2020-02-29 LAB — VITAMIN B12: Vitamin B-12: 249 pg/mL (ref 180–914)

## 2020-02-29 NOTE — Patient Instructions (Signed)
[place] at [facility] Discharge Instructions  You were seen today by Dr. Delton Coombes. He went over your recent results. Please increase your physical activity levels. Put Benadryl cream on your tick bite. Please continue your routine care with your primary care provider. Dr. Delton Coombes will see you back in 3 months for labs and follow up.   Thank you for choosing [place] at [facility] to provide your oncology and hematology care.  To afford each patient quality time with our provider, please arrive at least 15 minutes before your scheduled appointment time.   If you have a lab appointment with the [place] please come in thru the Main Entrance and check in at the main information desk  You need to re-schedule your appointment should you arrive 10 or more minutes late.  We strive to give you quality time with our providers, and arriving late affects you and other patients whose appointments are after yours.  Also, if you no show three or more times for appointments you may be dismissed from the clinic at the providers discretion.     Again, thank you for choosing [facility].  Our hope is that these requests will decrease the amount of time that you wait before being seen by our physicians.       _____________________________________________________________  Should you have questions after your visit to [facility], please contact our office at [phone] between the hours of 8:00 a.m. and 4:30 p.m.  Voicemails left after 4:00 p.m. will not be returned until the following business day.  For prescription refill requests, have your pharmacy contact our office and allow 72 hours.    Cancer Center Support Programs:   > Cancer Support Group  2nd Tuesday of the month 1pm-2pm, Journey Room
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
## 2020-03-03 IMAGING — DX PORTABLE CHEST - 1 VIEW
1 series · 1 of 1 positions shown · non-contrast
Comparison: 03/02/2019

CLINICAL DATA: Chest soreness post CABG.

EXAM:
PORTABLE CHEST 1 VIEW

[chest ap]
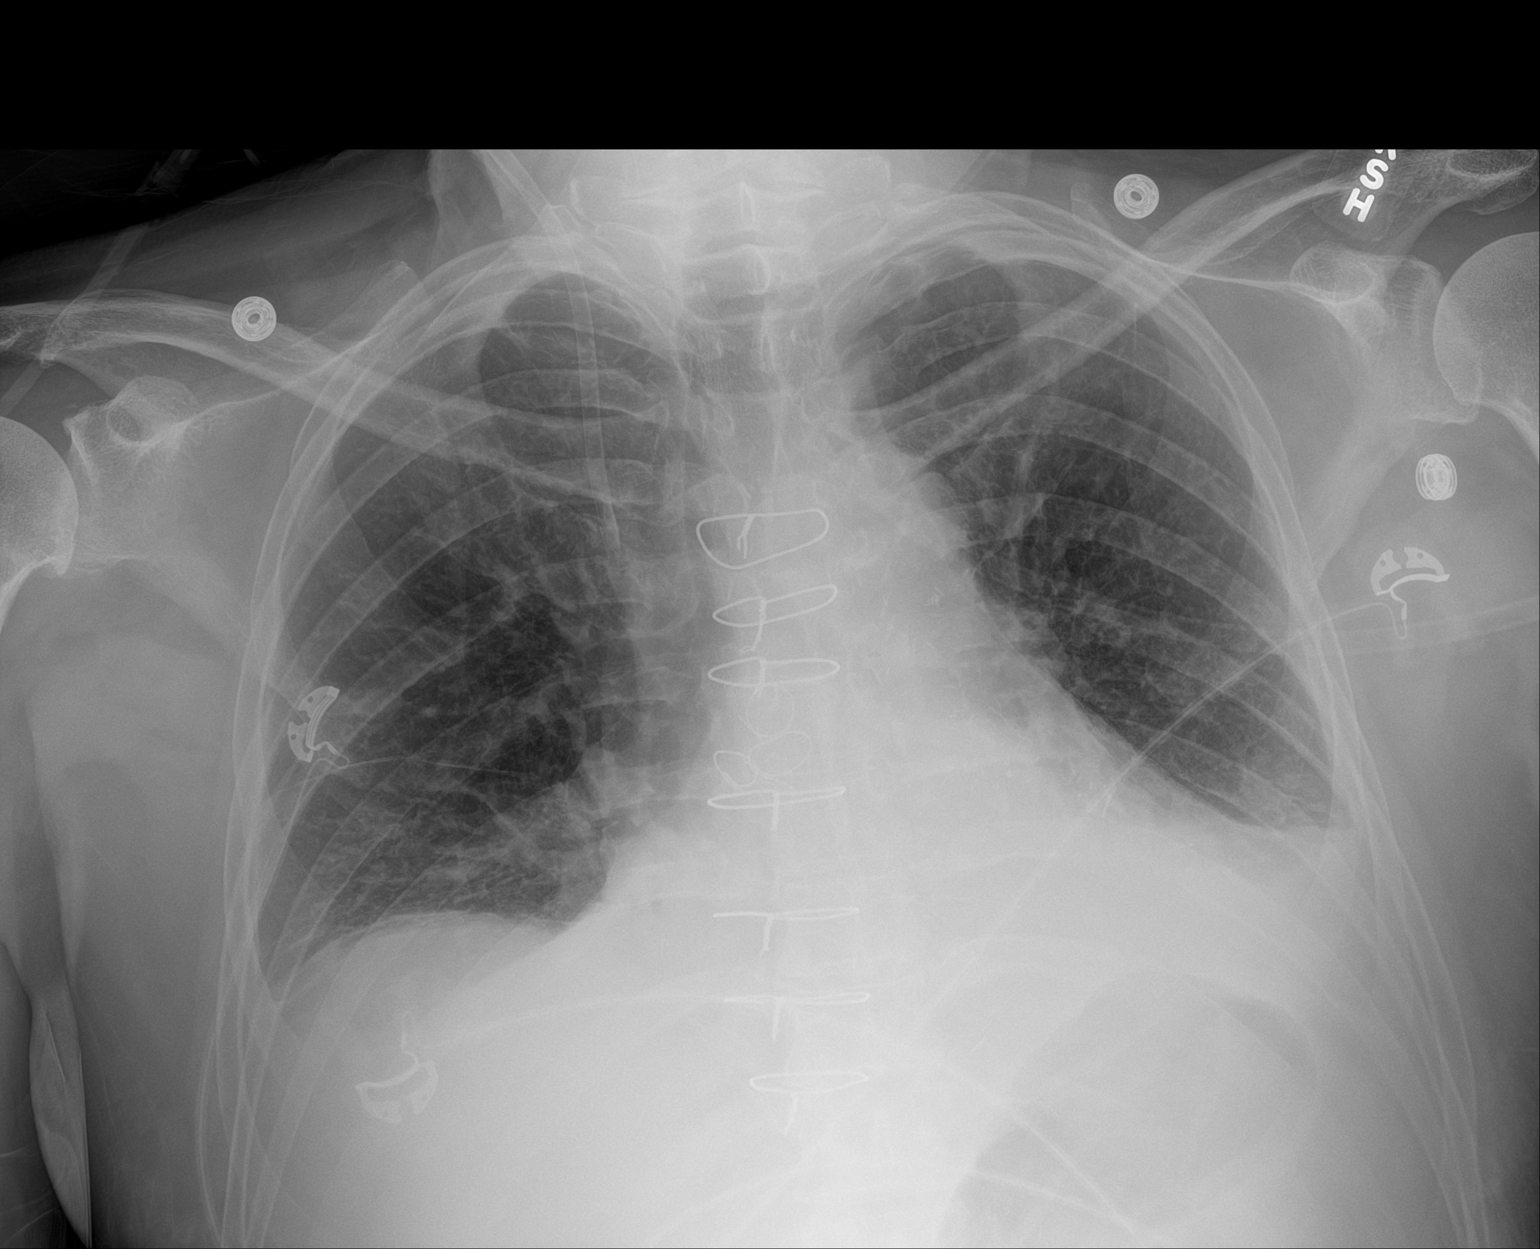

[1 of 1 positions shown; findings below may reference images not displayed]

FINDINGS: Interval removal of multiple tubes and lines. Right IJ central
venous sheath remains in place with tip over the SVC. Lungs are
hypoinflated demonstrate stable minimal left base opacification
likely small effusion with associated/atelectasis. Slight
improvement minimal right base atelectasis. Mild stable
cardiomegaly. Remainder of the exam is unchanged.
IMPRESSION: Stable left base opacification likely small effusion with associated
basilar atelectasis. Slight improvement mild right base atelectasis.

Interval removal of multiple tubes and lines with right IJ venous
sheath remaining in place with tip over the SVC.

## 2020-03-05 IMAGING — DX CHEST - 2 VIEW
2 series · 2 of 2 positions shown · non-contrast
Comparison: 03/04/2019

CLINICAL DATA: Post cardiac surgery

EXAM:
CHEST - 2 VIEW

[chest pa]
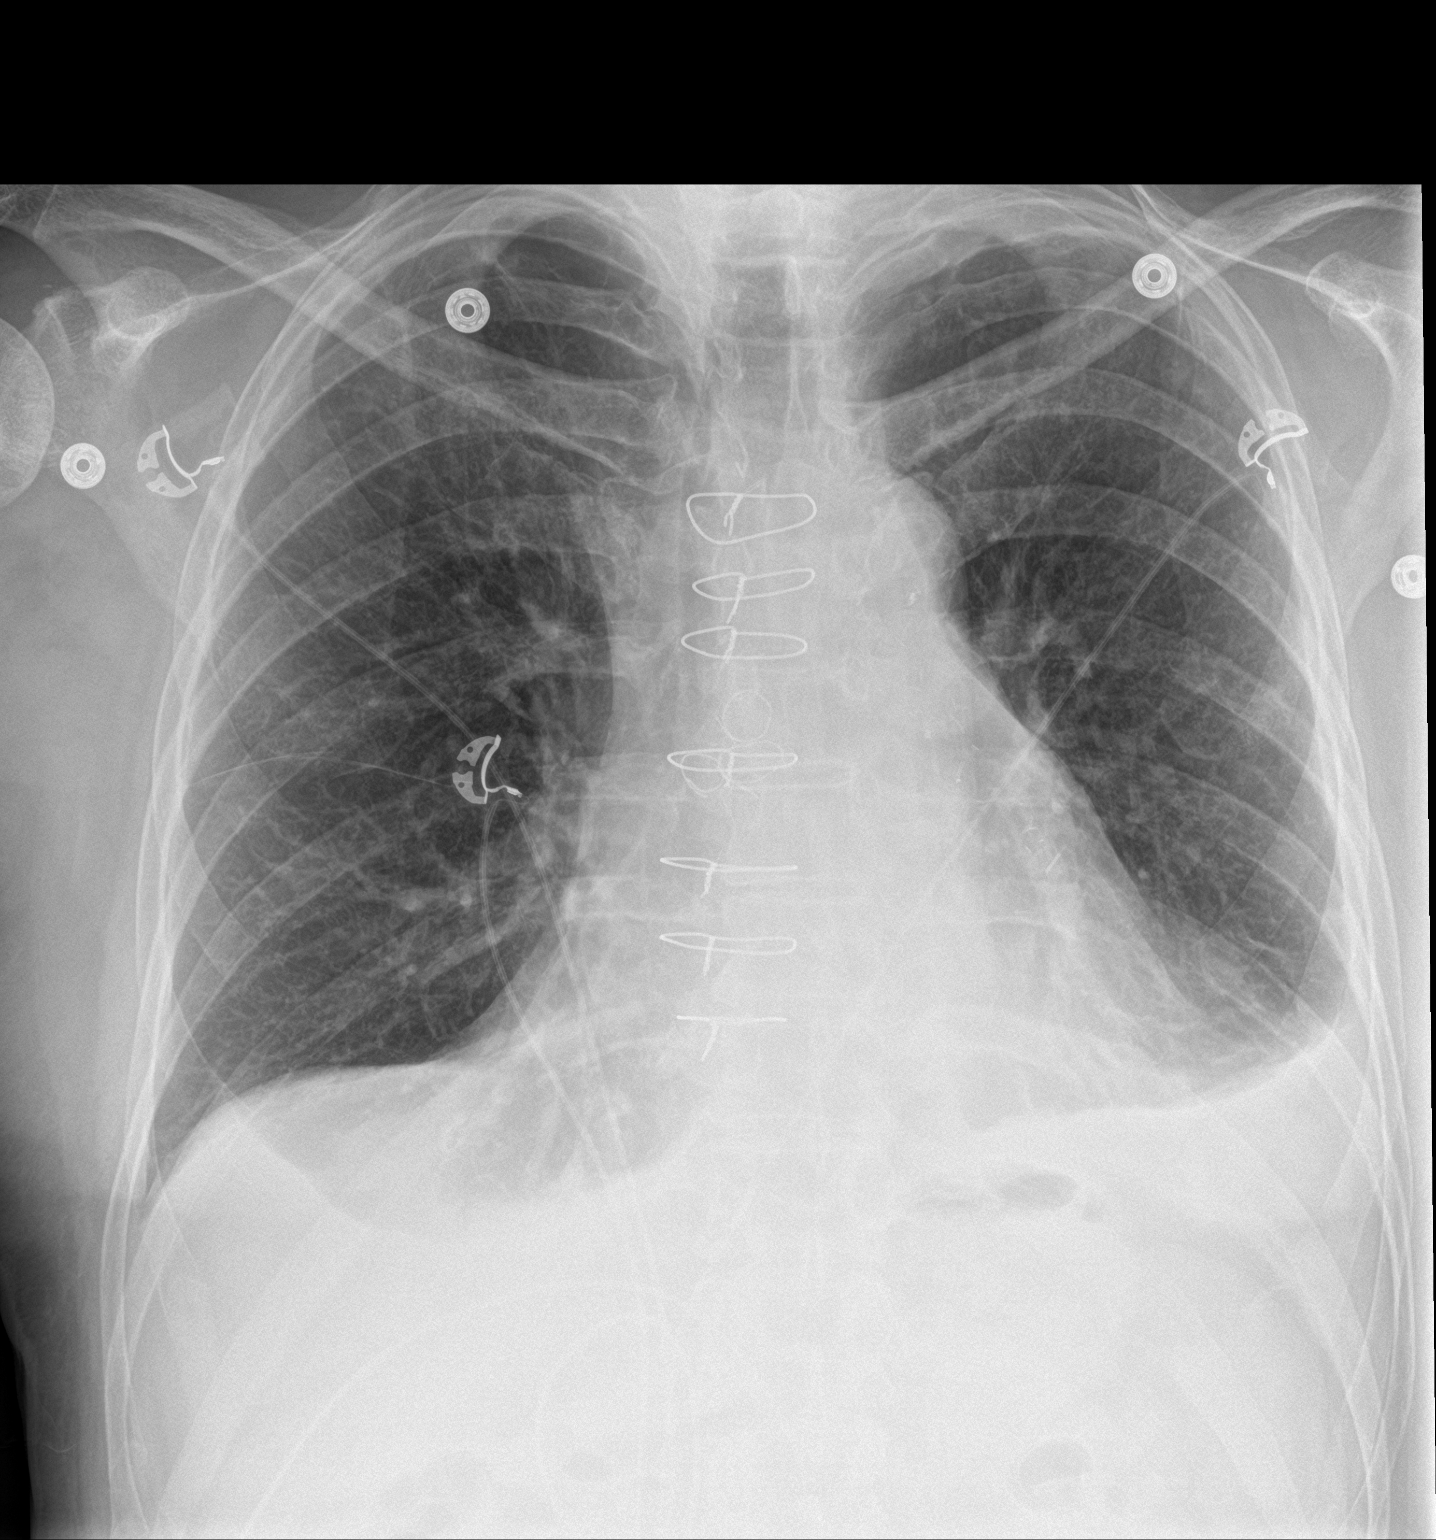

[chest lat]
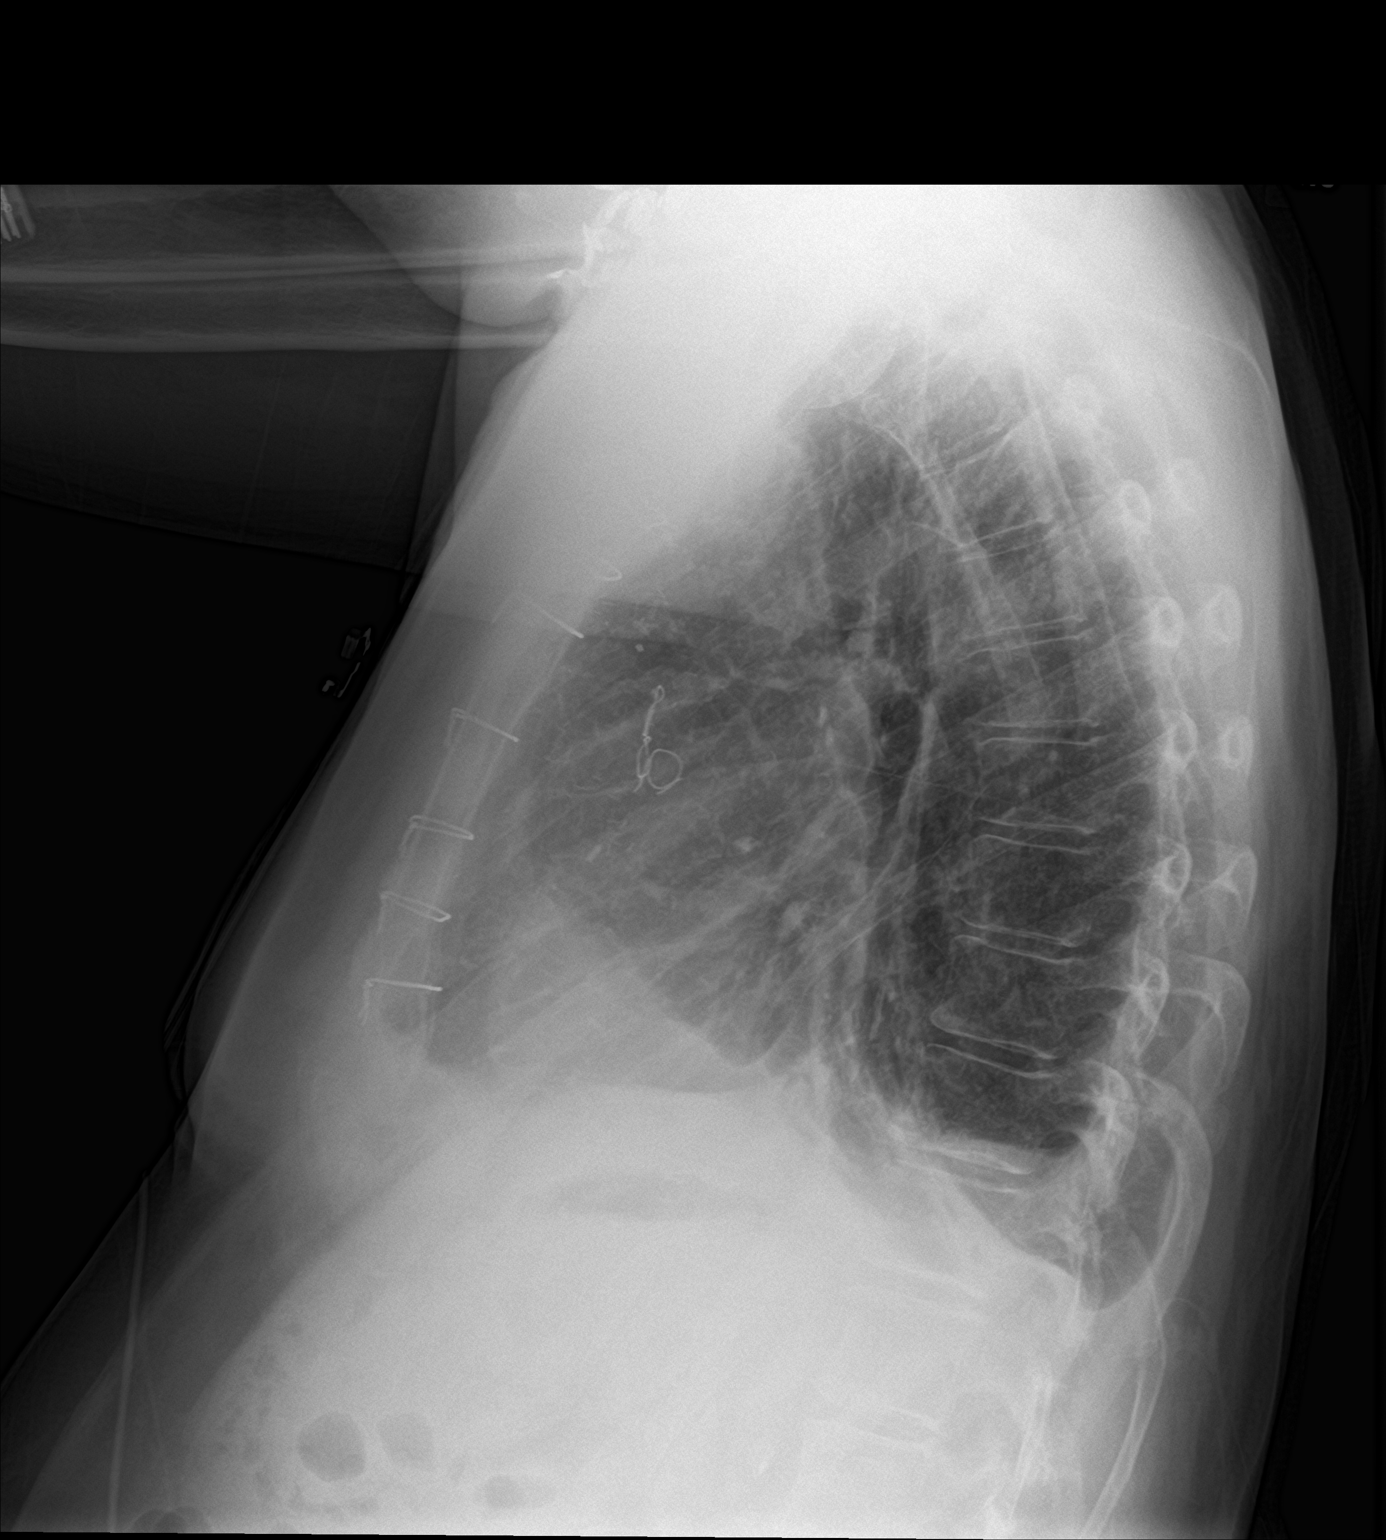

[2 of 2 positions shown; findings below may reference images not displayed]

FINDINGS: Borderline enlargement of cardiac silhouette post CABG.

Mediastinal contours and pulmonary vascularity normal.

Small bibasilar pleural effusions and atelectasis slightly greater
on LEFT.

Remaining lungs clear.

No infiltrate or pneumothorax.

Bones unremarkable.
IMPRESSION: Small bibasilar pleural effusions and atelectasis greater on LEFT.

## 2020-03-27 ENCOUNTER — Telehealth: Payer: Self-pay | Admitting: Family Medicine

## 2020-03-27 NOTE — Progress Notes (Signed)
  Chronic Care Management   Outreach Note  03/27/2020 Name: Jelan Batterton MRN: [id number] DOB: Nov 14, 1940  Referred by: Alycia Rossetti, MD Reason for referral : Chronic Care Management (Initial CCM Outreach)   An unsuccessful telephone outreach was attempted today. The patient was referred to the pharmacist for assistance with care management and care coordination.   Follow Up Plan:  [facility]

## 2020-04-13 ENCOUNTER — Telehealth: Payer: Self-pay | Admitting: Family Medicine

## 2020-04-13 NOTE — Progress Notes (Signed)
  Chronic Care Management   Note  04/13/2020 Name: Asia Favata MRN: [id number] DOB: 1941/04/10  Aarian Griffie is a 79 y.o. year old male who is a primary care patient of [place], Modena Nunnery, MD. I reached out to [name] by phone today in response to a referral sent by Mr. Bart Azzara's PCP, Buelah Manis, Modena Nunnery, MD.   Mr. Skirvin was given information about Chronic Care Management services today including:  1. CCM service includes personalized support from designated clinical staff supervised by his physician, including individualized plan of care and coordination with other care providers 2. 24/7 contact phone numbers for assistance for urgent and routine care needs. 3. Service will only be billed when office clinical staff spend 20 minutes or more in a month to coordinate care. 4. Only one practitioner may furnish and bill the service in a calendar month. 5. The patient may stop CCM services at any time (effective at the end of the month) by phone call to the office staff.   Patient agreed to services and verbal consent obtained.   Follow up plan:   Carley Perdue [employer] Scheduler

## 2020-04-16 ENCOUNTER — Other Ambulatory Visit: Payer: Self-pay | Admitting: Family Medicine

## 2020-04-28 ENCOUNTER — Other Ambulatory Visit: Payer: Self-pay

## 2020-04-28 ENCOUNTER — Other Ambulatory Visit: Payer: Medicare Other

## 2020-04-28 DIAGNOSIS — E539 Vitamin B deficiency, unspecified: Secondary | ICD-10-CM

## 2020-04-28 DIAGNOSIS — I5032 Chronic diastolic (congestive) heart failure: Secondary | ICD-10-CM

## 2020-04-28 DIAGNOSIS — E119 Type 2 diabetes mellitus without complications: Secondary | ICD-10-CM | POA: Diagnosis not present

## 2020-04-28 DIAGNOSIS — R5383 Other fatigue: Secondary | ICD-10-CM

## 2020-04-29 LAB — EXTRA SPECIMEN

## 2020-04-29 LAB — HEMOGLOBIN A1C
Hgb A1c MFr Bld: 5.9 % of total Hgb — ABNORMAL HIGH (ref <5.7)
Mean Plasma Glucose: 123 (calc)
eAG (mmol/L): 6.8 (calc)

## 2020-05-02 ENCOUNTER — Other Ambulatory Visit: Payer: Self-pay

## 2020-05-02 ENCOUNTER — Encounter: Payer: Self-pay | Admitting: Family Medicine

## 2020-05-02 ENCOUNTER — Ambulatory Visit (INDEPENDENT_AMBULATORY_CARE_PROVIDER_SITE_OTHER): Payer: Medicare Other | Admitting: Family Medicine

## 2020-05-02 DIAGNOSIS — I2510 Atherosclerotic heart disease of native coronary artery without angina pectoris: Secondary | ICD-10-CM | POA: Diagnosis not present

## 2020-05-02 DIAGNOSIS — E119 Type 2 diabetes mellitus without complications: Secondary | ICD-10-CM | POA: Diagnosis not present

## 2020-05-02 DIAGNOSIS — G894 Chronic pain syndrome: Secondary | ICD-10-CM

## 2020-05-02 NOTE — Progress Notes (Signed)
  Subjective:    Patient ID: Benjamin [name], male    DOB: 1940-09-20, 79 y.o.   MRN: [id number]  Patient presents for Follow-up (chronic care management)  Patient here for follow-up.  At his last visit he had been hospitalized for hypoxia with respiratory failure presumptive pneumonia and COPD.  He was also treated for [facility] spotted fever.  He had continued fatigue following Saturday treatment so extended another 7 days.  And underlying leukopenia and now B12 deficiency which is already being followed by hematology for.  He had transaminitis but that resolved   Chronic pain-l he has been trying to take acetaminophen   Diabetes  mellitus his last A1c was 6.1% his labs yesterday was improved to 5.9%  Reviewed labs from hematology From June  his B12 is improved at 240 Hemoglobin normal at 14.7 platelets normal and white blood cell count normal Metabolic panel reviewed renal function was normal and liver function was also normal Recommended he continue oral B12 but he doesn't want to take   Weight last visit  184lbs   He is on Repatha with Cardiology, LDL 14 in May  He stopping taking ASA because he is cold all the time   Review Of Systems:  GEN- denies fatigue, fever, weight loss,weakness, recent illness HEENT- denies eye drainage, change in vision, nasal discharge, CVS- denies chest pain, palpitations RESP- denies SOB, cough, wheeze ABD- denies N/V, change in stools, abd pain GU- denies dysuria, hematuria, dribbling, incontinence MSK- denies joint pain, muscle aches, injury Neuro- denies headache, dizziness, syncope, seizure activity       Objective:    BP 114/70 (BP Location: Left Arm, Patient Position: Standing, Cuff Size: Normal)   Pulse 72   Temp 97.7 F (36.5 C) (Temporal)   Resp 18   Wt 181 lb 14.4 oz (82.5 kg)   SpO2 97%   BMI 25.37 kg/m  GEN- NAD, alert and oriented x3 HEENT- PERRL, EOMI, non injected sclera, pink conjunctiva, MMM, oropharynx clear Neck-  Supple, no thyromegaly CVS- RRR, no murmur RESP-CTAB ABD-NABS,soft,NT,ND EXT- No edema Pulses- Radial, DP- 2+        Assessment & Plan:      Eye exam scheduled for Sept  Problem List Items Addressed This Visit      Unprioritized   CAD (coronary artery disease)    Bp controlled, no fluid  Overload On repatha, reviewed last lipid with him Recheck with cardiology in Oct       Chronic pain syndrome    Tylenol as needed No longer on dilaudid He still gets itching at his CABG scar, can use topical cortisone, scar cream       Type 2 diabetes mellitus without complications (HCC)    Diet controlled, A1C improved He still eats a lot of junk food , mostly coffee, rare water          Note: This dictation was prepared with Dragon dictation along with smaller phrase technology. Any transcriptional errors that result from this process are unintentional.

## 2020-05-02 NOTE — Patient Instructions (Signed)
F/U 4-5 months for wellness visit

## 2020-05-02 NOTE — Assessment & Plan Note (Signed)
Diet controlled, A1C improved He still eats a lot of junk food , mostly coffee, rare water

## 2020-05-02 NOTE — Assessment & Plan Note (Signed)
Tylenol as needed No longer on dilaudid He still gets itching at his CABG scar, can use topical cortisone, scar cream

## 2020-05-02 NOTE — Assessment & Plan Note (Signed)
Bp controlled, no fluid  Overload On repatha, reviewed last lipid with him Recheck with cardiology in Oct

## 2020-05-23 DIAGNOSIS — H4720 Unspecified optic atrophy: Secondary | ICD-10-CM | POA: Diagnosis not present

## 2020-05-23 LAB — HM DIABETES EYE EXAM

## 2020-05-25 ENCOUNTER — Telehealth: Payer: Self-pay | Admitting: Family Medicine

## 2020-05-25 NOTE — Progress Notes (Signed)
°  Chronic Care Management   Note  05/25/2020 Name: Taison Celani MRN: [id number] DOB: 11-Sep-1941  [name] is a 79 y.o. year old male who is a primary care patient of [place], Modena Nunnery, MD. I reached out to [name] by phone today in response to a referral sent by Mr. Daelan Seidner's PCP, Buelah Manis, Modena Nunnery, MD.   Mr. Ellis was given information about Chronic Care Management services today including:  1. CCM service includes personalized support from designated clinical staff supervised by his physician, including individualized plan of care and coordination with other care providers 2. 24/7 contact phone numbers for assistance for urgent and routine care needs. 3. Service will only be billed when office clinical staff spend 20 minutes or more in a month to coordinate care. 4. Only one practitioner may furnish and bill the service in a calendar month. 5. The patient may stop CCM services at any time (effective at the end of the month) by phone call to the office staff.   Patient agreed to services and verbal consent obtained.   Follow up plan:   Carley Perdue [employer] Scheduler

## 2020-05-31 ENCOUNTER — Ambulatory Visit: Payer: Medicare Other

## 2020-06-01 NOTE — Chronic Care Management (AMB) (Signed)
Chronic Care Management Pharmacy  Name: Benjamin [name]  MRN: [id number] DOB: Jan 06, 1941   Chief Complaint/ HPI  Benjamin [name],  79 y.o. , male presents for their Initial CCM visit with the clinical pharmacist In office.  PCP : Benjamin Rossetti, MD  Their chronic conditions include: CHF, CAD, COPD, GERD, Type II DM, Chronic back pain.   Medications: Outpatient Encounter Medications as of 06/05/2020  Medication Sig  . CRANBERRY EXTRACT PO Take 300 mg by mouth 2 (two) times daily.  . Evolocumab (REPATHA SURECLICK) 672 MG/ML SOAJ Inject 1 Dose into the skin every 14 (fourteen) days.  . metoprolol tartrate (LOPRESSOR) 25 MG tablet Take 0.5 tablets (12.5 mg total) by mouth 2 (two) times daily.  . pantoprazole (PROTONIX) 40 MG tablet Take 40 mg by mouth as needed.   . tamsulosin (FLOMAX) 0.4 MG CAPS capsule TAKE 2 CAPSULES BY MOUTH ONCE DAILY AFTER SUPPER FOR PROSTATE  . ipratropium-albuterol (DUONEB) 0.5-2.5 (3) MG/3ML SOLN Take 3 mLs by nebulization 2 (two) times daily. (Patient not taking: Reported on 06/05/2020)  . vitamin B-12 (CYANOCOBALAMIN) 500 MCG tablet Take 1 tablet (500 mcg total) by mouth daily. (Patient not taking: Reported on 06/05/2020)   No facility-administered encounter medications on file as of 06/05/2020.     Current Diagnosis/Assessment:   [employer] officer:   . Difficulty of Paying Living Expenses: Not on file    Goals Addressed            This Visit's Progress   . Pharmacy Care Plan:       CARE PLAN ENTRY (see longitudinal plan of care for additional care plan information)  Current Barriers:  . Chronic Disease Management support, education, and care coordination needs related to Diabetes, Heart Failure, and COPD   Diabetes Lab Results  Component Value Date/Time   HGBA1C 5.9 (H) 04/28/2020 10:45 AM   HGBA1C 6.1 (H) 12/16/2019 02:03 PM   . Pharmacist Clinical Goal(s): o Over the next 180 days, patient will work with PharmD and providers to achieve  A1c goal  < 5.7 . Current regimen:  o No medications . Interventions: o Reviewed most recent A1c o Discussed current dietary choices . Patient self care activities - Over the next 180 days, patient will: o Check A1c at office visit in December document, and provide at future appointments o Work on diet low in carbohydrates and sugars  HF . Pharmacist Clinical Goal(s) o Over the next 180 days, patient will work with PharmD and providers to optimize medication and minimize symptoms of HF. . Current regimen:  o Metoprolol tartrate 25mg  one-half tablet by mouth twice daily . Interventions: o Discussed symptoms of swelling/dizziness . Patient self care activities - Over the next 180 days, patient will: o Continue to focus on medication adherence by pill count COPD . Pharmacist Clinical Goal(s) o Over the next 180 days, patient will work with PharmD and providers to optimize medication and minimize symptoms of COPD . Current regimen:  o Duoneb nebulizers as needed (not currently having to use these) . Interventions: o Discussed frequency of shortness of breath o Discussed frequency of albuterol use . Patient self care activities - Over the next 180 days, patient will: o Contact providers with any sudden change in symptoms including SOB or wheezing   Initial goal documentation       COPD    Eosinophil count:   Lab Results  Component Value Date/Time   EOSPCT 8 02/29/2020 11:05 AM  %  Eos (Absolute):  Lab Results  Component Value Date/Time   EOSABS 0.5 02/29/2020 11:05 AM    Tobacco Status:  Social History   Tobacco Use  Smoking Status Former Smoker  . Packs/day: 0.50  . Years: 50.00  . Pack years: 25.00  . Types: Cigarettes  . Quit date: 02/14/2014  . Years since quitting: 6.3  Smokeless Tobacco Never Used  Tobacco Comment   Quit x 1 year    Patient has failed these meds in past: none noted Patient is currently controlled on the  following medications:   Duoneb 0.5-2.5 ml/26ml Using maintenance inhaler regularly? No Frequency of rescue inhaler use:  never  Patient reports he has no had to use the Duoneb in over a year.  Denies any recent SOB, wheezing.  Reports sometimes he does get tired when walking up steps, etc.  No concerns with breathing per pt.  Plan  Continue current medications  Heart Failure   Type: Diastolic  Patient has failed these meds in past: none noted Patient is currently controlled on the following medications:   Metoprolol Tartrate 25mg  one-half tablet po bid prn  Patient is compliant with medications, denies dizziness, swelling.  Plan  Continue current medications    Diabetes   A1c goal < 5.7  Recent Relevant Labs: Lab Results  Component Value Date/Time   HGBA1C 5.9 (H) 04/28/2020 10:45 AM   HGBA1C 6.1 (H) 12/16/2019 02:03 PM    Last diabetic Eye exam:  Lab Results  Component Value Date/Time   HMDIABEYEEXA No Retinopathy 05/19/2018 12:00 AM    Last diabetic Foot exam: No results found for: HMDIABFOOTEX   Checking BG: Never  No logs available.  Patient has failed these meds in past: none noted Patient is currently controlled on the following medications: . No medications  He is not really eating a strict diet currently.  His wife says she is trying to cut back on the sugars he eats, etc.  Counseled on limitation of carbohydrates and sugary beverages.  Most recent A1c still in pre-diabetic range.  Plan  Continue current medications.  GERD   Patient has failed these meds in past: none noted Patient is currently controlled on the following medications:  . Pantoprazole 40mg  daily  He is taking regularly, reports he has history of GI bleeds with NSAIDs.  Did discuss that currently he is only getting a two weeks supply at a time on this medications.  Will follow up with prescribing MD to see if we can get this extended.  Counseled on appropriate timing of  medication.  Plan  Continue current medications, contact prescribing MD for extended refill. CAD   Patient has failed these meds in past: ASA 81mg  (declines due to cold) Patient is currently controlled on the following medications:  . None currently  He is controlling risk factors with Repatha, cholesterol is well WNL.  Declines ASA 81mg  due to making him cold.  Plan  Continue current medications Chronic Pain   Patient has failed these meds in past: none noted Patient is currently controlled on the following medications:  . Tylenol prn  He takes tylenol prn for pain, no longer takes Dilaudid although he still has some left over just in case.  Plan  Continue current medications Vaccines   Reviewed and discussed patient's vaccination history.    Immunization History  Administered Date(s) Administered  . Fluad Quad(high Dose 65+) 05/25/2019  . Hep A / Hep B 10/23/2018, 11/23/2018, 04/21/2019  . Influenza Split 08/10/2004, 07/30/2005,  07/16/2007, 07/01/2012  . Influenza, High Dose Seasonal PF 06/17/2017  . Influenza,inj,Quad PF,6+ Mos 07/19/2013, 07/17/2015, 06/25/2016, 06/13/2017, 07/09/2018  . Influenza-Unspecified 08/27/2000, 05/17/2010, 06/16/2014  . PFIZER SARS-COV-2 Vaccination 11/21/2019, 12/12/2019  . Pneumococcal Conjugate-13 02/19/2016  . Pneumococcal Polysaccharide-23 11/13/2011  . Td 07/12/2005  . Tdap 10/30/2012, 03/30/2017  . Zoster 11/13/2011    Plan  Recommended patient receive Shingrix vaccine, however he declines. Medication Management   . Miscellaneous medications: o Tamsulosin . OTC's:  o Cranberry + Vitamin C . Patient currently uses [employer].  . Patient reports using no specific method to organize medications and promote adherence. . Patient denies missed doses of medication.   Beverly Milch, PharmD Clinical Pharmacist [place] [phone]

## 2020-06-02 ENCOUNTER — Telehealth: Payer: Self-pay | Admitting: Pharmacist

## 2020-06-02 NOTE — Progress Notes (Signed)
Chronic Care Management Pharmacy Assistant   Name: Trayquan Kolakowski  MRN: [id number] DOB: May 27, 1941  Reason for Encounter: Medication Review  Patient Questions:  1.  Have you seen any other providers? Yes, 08/17/21Lonell Grandchild F. Buelah Manis, MD  2.  Any changes in your medicines or health? Yes, patient's diet is controlled and his A1C has improved.   [name],  79 y.o. , male presents for their Initial CCM visit with the clinical pharmacist In office.  PCP : Alycia Rossetti, MD  Allergies:   Allergies  Allergen Reactions  . Arsenic Swelling    Severe swelling if patient comes in contact   . Contrast Media [Iodinated Diagnostic Agents] Swelling and Rash  . Statins Rash    Joint pain    Medications: Outpatient Encounter Medications as of 06/02/2020  Medication Sig  . CRANBERRY EXTRACT PO Take 300 mg by mouth 2 (two) times daily.  . Evolocumab (REPATHA SURECLICK) 704 MG/ML SOAJ Inject 1 Dose into the skin every 14 (fourteen) days.  Marland Kitchen ipratropium-albuterol (DUONEB) 0.5-2.5 (3) MG/3ML SOLN Take 3 mLs by nebulization 2 (two) times daily.  . metoprolol tartrate (LOPRESSOR) 25 MG tablet Take 0.5 tablets (12.5 mg total) by mouth 2 (two) times daily.  . pantoprazole (PROTONIX) 40 MG tablet Take 40 mg by mouth as needed.   . tamsulosin (FLOMAX) 0.4 MG CAPS capsule TAKE 2 CAPSULES BY MOUTH ONCE DAILY AFTER SUPPER FOR PROSTATE  . vitamin B-12 (CYANOCOBALAMIN) 500 MCG tablet Take 1 tablet (500 mcg total) by mouth daily.   No facility-administered encounter medications on file as of 06/02/2020.    Current Diagnosis: Patient Active Problem List   Diagnosis Date Noted  . Thrombocytopenia ([place]) 01/23/2020  . Hypoxia   . Leukopenia 01/21/2020  . Chronic allergic rhinitis 12/21/2019  . Chronic pain syndrome 12/21/2019  . Chronic insomnia 12/20/2019  . AK (actinic keratosis) 03/17/2019  . 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 CAD (coronary artery disease) 02/05/2019  . Aortic  atherosclerosis ([place]) 09/22/2018  . Type 2 diabetes mellitus without complications ([place]) 88/89/1694  . Acute respiratory failure with hypoxia ([place])   . Chronic diastolic CHF (congestive heart failure) ([place])   . Irritable bowel syndrome with diarrhea   . Coronary artery calcification seen on CAT scan   . Chronic bilateral thoracic back pain 02/12/2017  . GERD (gastroesophageal reflux disease) 11/27/2016  . Constipation 10/28/2016  . Erectile dysfunction 06/25/2016  . Chronic diarrhea 06/07/2016  . Duodenal ulcer 02/19/2016  . COPD (chronic obstructive pulmonary disease) ([place]) 10/20/2015  . DDD (degenerative disc disease), lumbar 01/11/2014  . BPH (benign prostatic hyperplasia) 03/09/2012  . Cataract 03/09/2012    Goals Addressed   None     Follow-Up:  [facility]ination [facility]    Have you seen any other providers since your last visit? Patient states he has seen his eye doctor (ophthalmology.) Any changes in your medications or health? no Any side effects from any medications? no Do you have an symptoms or problems not managed by your medications? no Any concerns about your health right now? Patient stated no he is fine. Has your provider asked that you check blood pressure, blood sugar, or follow special diet at home? no Do you get any type of exercise on a regular basis? no Can you think of a goal you would like to reach for your health? Is there anything you can think of that you could do better? No current goals. Patient stated  he doesn't have any goals and there isn't anything he could be doing better, because he is fine. Do you have any problems getting your medications? no Is there anything that you would like to discuss during the appointment? No current concerns.  Please bring medications and supplements to appointment

## 2020-06-05 ENCOUNTER — Other Ambulatory Visit: Payer: Self-pay

## 2020-06-05 ENCOUNTER — Ambulatory Visit: Payer: Medicare Other | Admitting: Pharmacist

## 2020-06-05 DIAGNOSIS — J410 Simple chronic bronchitis: Secondary | ICD-10-CM

## 2020-06-05 DIAGNOSIS — E119 Type 2 diabetes mellitus without complications: Secondary | ICD-10-CM

## 2020-06-05 DIAGNOSIS — I5032 Chronic diastolic (congestive) heart failure: Secondary | ICD-10-CM

## 2020-06-05 NOTE — Patient Instructions (Addendum)
Visit Information Thank you for meeting with me today!  I look forward to working with you to help you meet all of your healthcare goals and answer any questions you may have.  Feel free to contact me anytime!  Goals Addressed            This Visit's Progress   . Pharmacy Care Plan:       CARE PLAN ENTRY (see longitudinal plan of care for additional care plan information)  Current Barriers:  . Chronic Disease Management support, education, and care coordination needs related to Diabetes, Heart Failure, and COPD   Diabetes Lab Results  Component Value Date/Time   HGBA1C 5.9 (H) 04/28/2020 10:45 AM   HGBA1C 6.1 (H) 12/16/2019 02:03 PM   . Pharmacist Clinical Goal(s): o Over the next 180 days, patient will work with PharmD and providers to achieve A1c goal  < 5.7 . Current regimen:  o No medications . Interventions: o Reviewed most recent A1c o Discussed current dietary choices . Patient self care activities - Over the next 180 days, patient will: o Check A1c at office visit in December document, and provide at future appointments o Work on diet low in carbohydrates and sugars  HF . Pharmacist Clinical Goal(s) o Over the next 180 days, patient will work with PharmD and providers to optimize medication and minimize symptoms of HF. . Current regimen:  o Metoprolol tartrate 25mg  one-half tablet by mouth twice daily . Interventions: o Discussed symptoms of swelling/dizziness . Patient self care activities - Over the next 180 days, patient will: o Continue to focus on medication adherence by pill count COPD . Pharmacist Clinical Goal(s) o Over the next 180 days, patient will work with PharmD and providers to optimize medication and minimize symptoms of COPD . Current regimen:  o Duoneb nebulizers as needed (not currently having to use these) . Interventions: o Discussed frequency of shortness of breath o Discussed frequency of albuterol use . Patient self care activities -  Over the next 180 days, patient will: o Contact providers with any sudden change in symptoms including SOB or wheezing   Initial goal documentation        Mr. Couey was given information about Chronic Care Management services today including:  1. CCM service includes personalized support from designated clinical staff supervised by his physician, including individualized plan of care and coordination with other care providers 2. 24/7 contact phone numbers for assistance for urgent and routine care needs. 3. Standard insurance, coinsurance, copays and deductibles apply for chronic care management only during months in which we provide at least 20 minutes of these services. Most insurances cover these services at 100%, however patients may be responsible for any copay, coinsurance and/or deductible if applicable. This service may help you avoid the need for more expensive face-to-face services. 4. Only one practitioner may furnish and bill the service in a calendar month. 5. The patient may stop CCM services at any time (effective at the end of the month) by phone call to the office staff.  Patient agreed to services and verbal consent obtained.   The patient verbalized understanding of instructions provided today and agreed to receive a mailed copy of patient instruction and/or educational materials. Telephone follow up appointment with pharmacy team member scheduled for: 53 months  Hazel Wrinkle, PharmD Clinical Pharmacist [facility] [phone]   Diabetes Mellitus and Nutrition, Adult When you have diabetes (diabetes mellitus), it is very important to have healthy eating  habits because your blood sugar (glucose) levels are greatly affected by what you eat and drink. Eating healthy foods in the appropriate amounts, at about the same times every day, can help you:  Control your blood glucose.  Lower your risk of heart disease.  Improve your blood  pressure.  Reach or maintain a healthy weight. Every person with diabetes is different, and each person has different needs for a meal plan. Your health care provider may recommend that you work with a diet and nutrition specialist (dietitian) to make a meal plan that is best for you. Your meal plan may vary depending on factors such as:  The calories you need.  The medicines you take.  Your weight.  Your blood glucose, blood pressure, and cholesterol levels.  Your activity level.  Other health conditions you have, such as heart or kidney disease. How do carbohydrates affect me? Carbohydrates, also called carbs, affect your blood glucose level more than any other type of food. Eating carbs naturally raises the amount of glucose in your blood. Carb counting is a method for keeping track of how many carbs you eat. Counting carbs is important to keep your blood glucose at a healthy level, especially if you use insulin or take certain oral diabetes medicines. It is important to know how many carbs you can safely have in each meal. This is different for every person. Your dietitian can help you calculate how many carbs you should have at each meal and for each snack. Foods that contain carbs include:  Bread, cereal, rice, pasta, and crackers.  Potatoes and corn.  Peas, beans, and lentils.  Milk and yogurt.  Fruit and juice.  Desserts, such as cakes, cookies, ice cream, and candy. How does alcohol affect me? Alcohol can cause a sudden decrease in blood glucose (hypoglycemia), especially if you use insulin or take certain oral diabetes medicines. Hypoglycemia can be a life-threatening condition. Symptoms of hypoglycemia (sleepiness, dizziness, and confusion) are similar to symptoms of having too much alcohol. If your health care provider says that alcohol is safe for you, follow these guidelines:  Limit alcohol intake to no more than 1 drink per day for nonpregnant women and 2 drinks per  day for men. One drink equals 12 oz of beer, 5 oz of wine, or 1 oz of hard liquor.  Do not drink on an empty stomach.  Keep yourself hydrated with water, diet soda, or unsweetened iced tea.  Keep in mind that regular soda, juice, and other mixers may contain a lot of sugar and must be counted as carbs. What are tips for following this plan?  Reading food labels  Start by checking the serving size on the "Nutrition Facts" label of packaged foods and drinks. The amount of calories, carbs, fats, and other nutrients listed on the label is based on one serving of the item. Many items contain more than one serving per package.  Check the total grams (g) of carbs in one serving. You can calculate the number of servings of carbs in one serving by dividing the total carbs by 15. For example, if a food has 30 g of total carbs, it would be equal to 2 servings of carbs.  Check the number of grams (g) of saturated and trans fats in one serving. Choose foods that have low or no amount of these fats.  Check the number of milligrams (mg) of salt (sodium) in one serving. Most people should limit total sodium intake to less than  2,300 mg per day.  Always check the nutrition information of foods labeled as "low-fat" or "nonfat". These foods may be higher in added sugar or refined carbs and should be avoided.  Talk to your dietitian to identify your daily goals for nutrients listed on the label. Shopping  Avoid buying canned, premade, or processed foods. These foods tend to be high in fat, sodium, and added sugar.  Shop around the outside edge of the grocery store. This includes fresh fruits and vegetables, bulk grains, fresh meats, and fresh dairy. Cooking  Use low-heat cooking methods, such as baking, instead of high-heat cooking methods like deep frying.  Cook using healthy oils, such as olive, canola, or sunflower oil.  Avoid cooking with butter, cream, or high-fat meats. Meal planning  Eat  meals and snacks regularly, preferably at the same times every day. Avoid going long periods of time without eating.  Eat foods high in fiber, such as fresh fruits, vegetables, beans, and whole grains. Talk to your dietitian about how many servings of carbs you can eat at each meal.  Eat 4-6 ounces (oz) of lean protein each day, such as lean meat, chicken, fish, eggs, or tofu. One oz of lean protein is equal to: ? 1 oz of meat, chicken, or fish. ? 1 egg. ?  cup of tofu.  Eat some foods each day that contain healthy fats, such as avocado, nuts, seeds, and fish. Lifestyle  Check your blood glucose regularly.  Exercise regularly as told by your health care provider. This may include: ? 150 minutes of moderate-intensity or vigorous-intensity exercise each week. This could be brisk walking, biking, or water aerobics. ? Stretching and doing strength exercises, such as yoga or weightlifting, at least 2 times a week.  Take medicines as told by your health care provider.  Do not use any products that contain nicotine or tobacco, such as cigarettes and e-cigarettes. If you need help quitting, ask your health care provider.  Work with a Social worker or diabetes educator to identify strategies to manage stress and any emotional and social challenges. Questions to ask a health care provider  Do I need to meet with a diabetes educator?  Do I need to meet with a dietitian?  What number can I call if I have questions?  When are the best times to check my blood glucose? Where to find more information:  American Diabetes Association: diabetes.org  Academy of Nutrition and Dietetics: www.eatright.[employer] of Diabetes and Digestive and Kidney Diseases (NIH): [employer] Summary  A healthy meal plan will help you control your blood glucose and maintain a healthy lifestyle.  Working with a diet and nutrition specialist (dietitian) can help you make a meal plan that is best for  you.  Keep in mind that carbohydrates (carbs) and alcohol have immediate effects on your blood glucose levels. It is important to count carbs and to use alcohol carefully. This information is not intended to replace advice given to you by your health care provider. Make sure you discuss any questions you have with your health care provider. Document Revised: 08/15/2017 Document Reviewed: 10/07/2016 Elsevier Patient Education  2020 [employer].

## 2020-06-06 ENCOUNTER — Ambulatory Visit (HOSPITAL_COMMUNITY): Payer: Medicare Other | Admitting: Hematology

## 2020-06-06 ENCOUNTER — Other Ambulatory Visit (HOSPITAL_COMMUNITY): Payer: Medicare Other

## 2020-06-08 ENCOUNTER — Other Ambulatory Visit: Payer: Self-pay | Admitting: *Deleted

## 2020-06-08 DIAGNOSIS — I700 Atherosclerosis of aorta: Secondary | ICD-10-CM

## 2020-06-08 DIAGNOSIS — J410 Simple chronic bronchitis: Secondary | ICD-10-CM

## 2020-06-08 DIAGNOSIS — A770 Spotted fever due to Rickettsia rickettsii: Secondary | ICD-10-CM

## 2020-06-08 DIAGNOSIS — N400 Enlarged prostate without lower urinary tract symptoms: Secondary | ICD-10-CM

## 2020-06-08 DIAGNOSIS — I5032 Chronic diastolic (congestive) heart failure: Secondary | ICD-10-CM

## 2020-06-08 DIAGNOSIS — I2510 Atherosclerotic heart disease of native coronary artery without angina pectoris: Secondary | ICD-10-CM

## 2020-06-08 DIAGNOSIS — E119 Type 2 diabetes mellitus without complications: Secondary | ICD-10-CM

## 2020-06-08 DIAGNOSIS — M791 Myalgia, unspecified site: Secondary | ICD-10-CM

## 2020-06-08 DIAGNOSIS — J069 Acute upper respiratory infection, unspecified: Secondary | ICD-10-CM

## 2020-06-08 DIAGNOSIS — D72819 Decreased white blood cell count, unspecified: Secondary | ICD-10-CM

## 2020-06-08 DIAGNOSIS — J309 Allergic rhinitis, unspecified: Secondary | ICD-10-CM

## 2020-06-08 DIAGNOSIS — Z951 Presence of aortocoronary bypass graft: Secondary | ICD-10-CM

## 2020-06-08 DIAGNOSIS — F5104 Psychophysiologic insomnia: Secondary | ICD-10-CM

## 2020-06-08 DIAGNOSIS — G894 Chronic pain syndrome: Secondary | ICD-10-CM

## 2020-06-16 ENCOUNTER — Ambulatory Visit (INDEPENDENT_AMBULATORY_CARE_PROVIDER_SITE_OTHER): Payer: Medicare Other | Admitting: *Deleted

## 2020-06-16 ENCOUNTER — Other Ambulatory Visit: Payer: Self-pay

## 2020-06-16 DIAGNOSIS — Z23 Encounter for immunization: Secondary | ICD-10-CM

## 2020-06-23 ENCOUNTER — Other Ambulatory Visit: Payer: Self-pay

## 2020-06-23 ENCOUNTER — Encounter: Payer: Self-pay | Admitting: Cardiology

## 2020-06-23 ENCOUNTER — Ambulatory Visit (INDEPENDENT_AMBULATORY_CARE_PROVIDER_SITE_OTHER): Payer: Medicare Other | Admitting: Cardiology

## 2020-06-23 VITALS — BP 92/60 | HR 84 | Ht 72.0 in | Wt 191.0 lb

## 2020-06-23 DIAGNOSIS — I25119 Atherosclerotic heart disease of native coronary artery with unspecified angina pectoris: Secondary | ICD-10-CM | POA: Diagnosis not present

## 2020-06-23 DIAGNOSIS — J432 Centrilobular emphysema: Secondary | ICD-10-CM

## 2020-06-23 DIAGNOSIS — E782 Mixed hyperlipidemia: Secondary | ICD-10-CM | POA: Diagnosis not present

## 2020-06-23 DIAGNOSIS — Z789 Other specified health status: Secondary | ICD-10-CM

## 2020-06-23 NOTE — Patient Instructions (Signed)
Medication Instructions:  °Your physician recommends that you continue on your current medications as directed. Please refer to the Current Medication list given to you today. ° °*If you need a refill on your cardiac medications before your next appointment, please call your pharmacy* ° ° °Lab Work: °None today °If you have labs (blood work) drawn today and your tests are completely normal, you will receive your results only by: °• MyChart Message (if you have MyChart) OR °• A paper copy in the mail °If you have any lab test that is abnormal or we need to change your treatment, we will call you to review the results. ° ° °Testing/Procedures: °None today ° ° °Follow-Up: °At [facility], you and your health needs are our priority.  As part of our continuing mission to provide you with exceptional heart care, we have created designated Provider Care Teams.  These Care Teams include your primary Cardiologist (physician) and Advanced Practice Providers (APPs -  Physician Assistants and Nurse Practitioners) who all work together to provide you with the care you need, when you need it. ° °We recommend signing up for the patient portal called "MyChart".  Sign up information is provided on this After Visit Summary.  MyChart is used to connect with patients for Virtual Visits (Telemedicine).  Patients are able to view lab/test results, encounter notes, upcoming appointments, etc.  Non-urgent messages can be sent to your provider as well.   °To learn more about what you can do with MyChart, go to [email or web address]://[email or web address].com.   ° °Your next appointment:   °6 month(s) ° °The format for your next appointment:   °In Person ° °Provider:   °Samuel McDowell, MD ° ° °Other Instructions °None ° ° ° ° °Thank you for choosing [facility] ! ° ° ° ° ° ° ° ° °
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
## 2020-06-30 DIAGNOSIS — Z8719 Personal history of other diseases of the digestive system: Secondary | ICD-10-CM | POA: Diagnosis not present

## 2020-06-30 DIAGNOSIS — K580 Irritable bowel syndrome with diarrhea: Secondary | ICD-10-CM | POA: Diagnosis not present

## 2020-06-30 DIAGNOSIS — Z86010 Personal history of colonic polyps: Secondary | ICD-10-CM | POA: Diagnosis not present

## 2020-06-30 DIAGNOSIS — K219 Gastro-esophageal reflux disease without esophagitis: Secondary | ICD-10-CM | POA: Diagnosis not present

## 2020-07-06 ENCOUNTER — Inpatient Hospital Stay (HOSPITAL_COMMUNITY): Payer: Medicare Other | Attending: Hematology

## 2020-07-06 ENCOUNTER — Inpatient Hospital Stay (HOSPITAL_BASED_OUTPATIENT_CLINIC_OR_DEPARTMENT_OTHER): Payer: Medicare Other | Admitting: Hematology

## 2020-07-06 ENCOUNTER — Other Ambulatory Visit: Payer: Self-pay

## 2020-07-06 VITALS — BP 95/70 | HR 93 | Temp 97.1°F | Resp 18 | Wt 189.2 lb

## 2020-07-06 DIAGNOSIS — Z85828 Personal history of other malignant neoplasm of skin: Secondary | ICD-10-CM | POA: Diagnosis not present

## 2020-07-06 DIAGNOSIS — D709 Neutropenia, unspecified: Secondary | ICD-10-CM | POA: Diagnosis not present

## 2020-07-06 DIAGNOSIS — K219 Gastro-esophageal reflux disease without esophagitis: Secondary | ICD-10-CM | POA: Insufficient documentation

## 2020-07-06 DIAGNOSIS — D696 Thrombocytopenia, unspecified: Secondary | ICD-10-CM

## 2020-07-06 DIAGNOSIS — N400 Enlarged prostate without lower urinary tract symptoms: Secondary | ICD-10-CM | POA: Diagnosis not present

## 2020-07-06 DIAGNOSIS — D72819 Decreased white blood cell count, unspecified: Secondary | ICD-10-CM | POA: Diagnosis not present

## 2020-07-06 DIAGNOSIS — Z79899 Other long term (current) drug therapy: Secondary | ICD-10-CM | POA: Diagnosis not present

## 2020-07-06 DIAGNOSIS — R630 Anorexia: Secondary | ICD-10-CM | POA: Diagnosis not present

## 2020-07-06 DIAGNOSIS — R51 Headache with orthostatic component, not elsewhere classified: Secondary | ICD-10-CM | POA: Diagnosis not present

## 2020-07-06 DIAGNOSIS — G4700 Insomnia, unspecified: Secondary | ICD-10-CM | POA: Insufficient documentation

## 2020-07-06 DIAGNOSIS — I2510 Atherosclerotic heart disease of native coronary artery without angina pectoris: Secondary | ICD-10-CM | POA: Insufficient documentation

## 2020-07-06 DIAGNOSIS — Z87891 Personal history of nicotine dependence: Secondary | ICD-10-CM | POA: Diagnosis not present

## 2020-07-06 DIAGNOSIS — E538 Deficiency of other specified B group vitamins: Secondary | ICD-10-CM | POA: Diagnosis not present

## 2020-07-06 DIAGNOSIS — M129 Arthropathy, unspecified: Secondary | ICD-10-CM | POA: Insufficient documentation

## 2020-07-06 DIAGNOSIS — R110 Nausea: Secondary | ICD-10-CM | POA: Diagnosis not present

## 2020-07-06 LAB — CBC WITH DIFFERENTIAL/PLATELET
Abs Immature Granulocytes: 0.01 10*3/uL (ref 0.00–0.07)
Basophils Absolute: 0.1 10*3/uL (ref 0.0–0.1)
Basophils Relative: 1 %
Eosinophils Absolute: 0.3 10*3/uL (ref 0.0–0.5)
Eosinophils Relative: 4 %
HCT: 47.8 % (ref 39.0–52.0)
Hemoglobin: 15.7 g/dL (ref 13.0–17.0)
Immature Granulocytes: 0 %
Lymphocytes Relative: 22 %
Lymphs Abs: 1.5 10*3/uL (ref 0.7–4.0)
MCH: 31.2 pg (ref 26.0–34.0)
MCHC: 32.8 g/dL (ref 30.0–36.0)
MCV: 95 fL (ref 80.0–100.0)
Monocytes Absolute: 0.7 10*3/uL (ref 0.1–1.0)
Monocytes Relative: 11 %
Neutro Abs: 4.2 10*3/uL (ref 1.7–7.7)
Neutrophils Relative %: 62 %
Platelets: 278 10*3/uL (ref 150–400)
RBC: 5.03 MIL/uL (ref 4.22–5.81)
RDW: 14 % (ref 11.5–15.5)
WBC: 6.8 10*3/uL (ref 4.0–10.5)
nRBC: 0 % (ref 0.0–0.2)

## 2020-07-06 LAB — COMPREHENSIVE METABOLIC PANEL
ALT: 32 U/L (ref 0–44)
AST: 29 U/L (ref 15–41)
Albumin: 4.2 g/dL (ref 3.5–5.0)
Alkaline Phosphatase: 50 U/L (ref 38–126)
Anion gap: 11 (ref 5–15)
BUN: 16 mg/dL (ref 8–23)
CO2: 24 mmol/L (ref 22–32)
Calcium: 9.5 mg/dL (ref 8.9–10.3)
Chloride: 105 mmol/L (ref 98–111)
Creatinine, Ser: 1.06 mg/dL (ref 0.61–1.24)
GFR, Estimated: >60 mL/min (ref >60)
Glucose, Bld: 107 mg/dL — ABNORMAL HIGH (ref 70–99)
Potassium: 4.4 mmol/L (ref 3.5–5.1)
Sodium: 140 mmol/L (ref 135–145)
Total Bilirubin: 1.7 mg/dL — ABNORMAL HIGH (ref 0.3–1.2)
Total Protein: 7.2 g/dL (ref 6.5–8.1)

## 2020-07-06 LAB — VITAMIN B12: Vitamin B-12: 227 pg/mL (ref 180–914)
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
## 2020-07-06 NOTE — Patient Instructions (Signed)
[place] at [facility] Discharge Instructions  You were seen today by Dr. Delton Coombes. He went over your recent results. Dr. Delton Coombes will see you back if needed.   Thank you for choosing [place] at [facility] to provide your oncology and hematology care.  To afford each patient quality time with our provider, please arrive at least 15 minutes before your scheduled appointment time.   If you have a lab appointment with the [facility] please come in thru the Main Entrance and check in at the main information desk  You need to re-schedule your appointment should you arrive 10 or more minutes late.  We strive to give you quality time with our providers, and arriving late affects you and other patients whose appointments are after yours.  Also, if you no show three or more times for appointments you may be dismissed from the clinic at the providers discretion.     Again, thank you for choosing [facility].  Our hope is that these requests will decrease the amount of time that you wait before being seen by our physicians.       _____________________________________________________________  Should you have questions after your visit to [facility], please contact our office at [phone] between the hours of 8:00 a.m. and 4:30 p.m.  Voicemails left after 4:00 p.m. will not be returned until the following business day.  For prescription refill requests, have your pharmacy contact our office and allow 72 hours.    Cancer Center Support Programs:   > Cancer Support Group  2nd Tuesday of the month 1pm-2pm, Journey Room

## 2020-07-07 DIAGNOSIS — M79644 Pain in right finger(s): Secondary | ICD-10-CM | POA: Diagnosis not present

## 2020-07-15 ENCOUNTER — Other Ambulatory Visit: Payer: Self-pay | Admitting: Family Medicine

## 2020-08-16 ENCOUNTER — Other Ambulatory Visit: Payer: Self-pay

## 2020-08-16 MED ORDER — METOPROLOL TARTRATE 25 MG PO TABS: 12.5000 mg | ORAL_TABLET | Freq: Two times a day (BID) | ORAL | 3 refills | Status: DC

## 2020-08-18 ENCOUNTER — Telehealth: Payer: Self-pay

## 2020-08-18 NOTE — Telephone Encounter (Signed)
Mrs. Mans, wife of Mr. Tiggs called, Mr. [place] is having shortness of breath. I instructed her to take Mr. Propes to the nearest Hospital as soon as possible.

## 2020-08-24 DIAGNOSIS — H02831 Dermatochalasis of right upper eyelid: Secondary | ICD-10-CM | POA: Diagnosis not present

## 2020-08-24 DIAGNOSIS — H04221 Epiphora due to insufficient drainage, right lacrimal gland: Secondary | ICD-10-CM | POA: Diagnosis not present

## 2020-08-24 DIAGNOSIS — Z961 Presence of intraocular lens: Secondary | ICD-10-CM | POA: Diagnosis not present

## 2020-09-05 ENCOUNTER — Other Ambulatory Visit: Payer: Medicare Other

## 2020-09-05 ENCOUNTER — Other Ambulatory Visit: Payer: Self-pay

## 2020-09-05 DIAGNOSIS — I2510 Atherosclerotic heart disease of native coronary artery without angina pectoris: Secondary | ICD-10-CM

## 2020-09-05 DIAGNOSIS — I5032 Chronic diastolic (congestive) heart failure: Secondary | ICD-10-CM | POA: Diagnosis not present

## 2020-09-05 DIAGNOSIS — E119 Type 2 diabetes mellitus without complications: Secondary | ICD-10-CM

## 2020-09-05 DIAGNOSIS — E785 Hyperlipidemia, unspecified: Secondary | ICD-10-CM | POA: Diagnosis not present

## 2020-09-06 ENCOUNTER — Encounter: Payer: Self-pay | Admitting: Family Medicine

## 2020-09-06 ENCOUNTER — Ambulatory Visit (INDEPENDENT_AMBULATORY_CARE_PROVIDER_SITE_OTHER): Payer: Medicare Other | Admitting: Family Medicine

## 2020-09-06 VITALS — BP 120/72 | HR 61 | Temp 97.5°F | Ht 72.0 in | Wt 189.0 lb

## 2020-09-06 DIAGNOSIS — E119 Type 2 diabetes mellitus without complications: Secondary | ICD-10-CM

## 2020-09-06 DIAGNOSIS — Z0001 Encounter for general adult medical examination with abnormal findings: Secondary | ICD-10-CM

## 2020-09-06 DIAGNOSIS — J432 Centrilobular emphysema: Secondary | ICD-10-CM

## 2020-09-06 DIAGNOSIS — K219 Gastro-esophageal reflux disease without esophagitis: Secondary | ICD-10-CM | POA: Diagnosis not present

## 2020-09-06 DIAGNOSIS — I2510 Atherosclerotic heart disease of native coronary artery without angina pectoris: Secondary | ICD-10-CM

## 2020-09-06 DIAGNOSIS — I700 Atherosclerosis of aorta: Secondary | ICD-10-CM

## 2020-09-06 DIAGNOSIS — J410 Simple chronic bronchitis: Secondary | ICD-10-CM | POA: Diagnosis not present

## 2020-09-06 DIAGNOSIS — Z0000 Encounter for general adult medical examination without abnormal findings: Secondary | ICD-10-CM

## 2020-09-06 LAB — COMPLETE METABOLIC PANEL WITH GFR
AG Ratio: 2.2 (calc) (ref 1.0–2.5)
ALT: 21 U/L (ref 9–46)
AST: 19 U/L (ref 10–35)
Albumin: 4.2 g/dL (ref 3.6–5.1)
Alkaline phosphatase (APISO): 52 U/L (ref 35–144)
BUN/Creatinine Ratio: 13 (calc) (ref 6–22)
BUN: 16 mg/dL (ref 7–25)
CO2: 27 mmol/L (ref 20–32)
Calcium: 9.2 mg/dL (ref 8.6–10.3)
Chloride: 109 mmol/L (ref 98–110)
Creat: 1.21 mg/dL — ABNORMAL HIGH (ref 0.70–1.18)
GFR, Est African American: 66 mL/min/{1.73_m2} (ref >=60)
GFR, Est Non African American: 57 mL/min/{1.73_m2} — ABNORMAL LOW (ref >=60)
Globulin: 1.9 g/dL (calc) (ref 1.9–3.7)
Glucose, Bld: 107 mg/dL — ABNORMAL HIGH (ref 65–99)
Potassium: 4.7 mmol/L (ref 3.5–5.3)
Sodium: 143 mmol/L (ref 135–146)
Total Bilirubin: 1.1 mg/dL (ref 0.2–1.2)
Total Protein: 6.1 g/dL (ref 6.1–8.1)

## 2020-09-06 LAB — LIPID PANEL
Cholesterol: 79 mg/dL (ref <200)
HDL: 41 mg/dL (ref >=40)
LDL Cholesterol (Calc): 20 mg/dL (calc)
Non-HDL Cholesterol (Calc): 38 mg/dL (calc) (ref <130)
Total CHOL/HDL Ratio: 1.9 (calc) (ref <5.0)
Triglycerides: 101 mg/dL (ref <150)

## 2020-09-06 LAB — CBC WITH DIFFERENTIAL/PLATELET
Absolute Monocytes: 760 cells/uL (ref 200–950)
Basophils Absolute: 104 cells/uL (ref 0–200)
Basophils Relative: 1.3 %
Eosinophils Absolute: 472 cells/uL (ref 15–500)
Eosinophils Relative: 5.9 %
HCT: 45.3 % (ref 38.5–50.0)
Hemoglobin: 15.5 g/dL (ref 13.2–17.1)
Lymphs Abs: 1872 cells/uL (ref 850–3900)
MCH: 31.6 pg (ref 27.0–33.0)
MCHC: 34.2 g/dL (ref 32.0–36.0)
MCV: 92.4 fL (ref 80.0–100.0)
MPV: 9.9 fL (ref 7.5–12.5)
Monocytes Relative: 9.5 %
Neutro Abs: 4792 cells/uL (ref 1500–7800)
Neutrophils Relative %: 59.9 %
Platelets: 284 10*3/uL (ref 140–400)
RBC: 4.9 10*6/uL (ref 4.20–5.80)
RDW: 13.1 % (ref 11.0–15.0)
Total Lymphocyte: 23.4 %
WBC: 8 10*3/uL (ref 3.8–10.8)

## 2020-09-06 LAB — HEMOGLOBIN A1C
Hgb A1c MFr Bld: 6.3 % of total Hgb — ABNORMAL HIGH (ref <5.7)
Mean Plasma Glucose: 134 mg/dL
eAG (mmol/L): 7.4 mmol/L

## 2020-09-06 LAB — MICROALBUMIN / CREATININE URINE RATIO
Creatinine, Urine: 176 mg/dL (ref 20–320)
Microalb Creat Ratio: 3 mcg/mg creat (ref <30)
Microalb, Ur: 0.5 mg/dL

## 2020-09-06 MED ORDER — ALBUTEROL SULFATE HFA 108 (90 BASE) MCG/ACT IN AERS: 2.0000 | INHALATION_SPRAY | RESPIRATORY_TRACT | 2 refills | Status: DC | PRN

## 2020-09-06 MED ORDER — TRELEGY ELLIPTA 100-62.5-25 MCG/INH IN AEPB: INHALATION_SPRAY | RESPIRATORY_TRACT | 3 refills | Status: DC

## 2020-09-06 NOTE — Patient Instructions (Addendum)
F/U 6 months  PFT to be done at [facility] Referral to lung doctor

## 2020-09-06 NOTE — Progress Notes (Signed)
Subjective:   Patient presents for Medicare Annual/Subsequent preventive examination.  Patient here to follow-up chronic medical problems review his labs and for wellness exam.  Diabetes mellitus fasting labs reviewed.  Diet controlled  He does not follow a low-carb low sugar diet , A1C 6.3%  due for foot exam    Aortic atherosclerosis s/p CABG - he is on Repatha by cardiology , he stopped ASA last visit in August because he was cold all the time   He was following with hematology had B12 def and anemia, he declined taking B12   COPD-he states that he continues to have some difficulty breathing.  He had cardiac work-up since his CABG back in May there were no significant changes states he was told by his cardiologist not his heart.  He states that none of the inhalers helped he has been using expired albuterol when he does use something.  He states his breathing is worse when he is lying down at night he has wheezing.  He denies any reflux symptoms while on the Pepcid.  As well as moderate to severe emphysema.  Weight is up 8lbs since last visit in August     He has appt with opthalomogist with a specialist in [place] for blocked tear ducts, Feb 2nd   he is not on any new drops  has artificial tears     He has a spot on both thighs not healthing Repatha shots, no drainage        Decreased monofilament toes   Review Past Medical/Family/Social: Per EMR   Risk Factors  Current exercise habits: walks Dietary issues discussed: Yes  Cardiac risk factors: Glucose intolerance, Hyperlipidemia   Depression Screen  (Note: if answer to either of the following is "Yes", a more complete depression screening is indicated)  Over the past two weeks, have you felt down, depressed or hopeless? No Over the past two weeks, have you felt little interest or pleasure in doing things? No Have you lost interest or pleasure in daily life? No Do you often feel hopeless? No Do you cry easily over  simple problems? No   Activities of Daily Living  In your present state of health, do you have any difficulty performing the following activities?:  Driving? No  Managing money? No  Feeding yourself? No  Getting from bed to chair? Yes   Climbing a flight of stairs? No  Preparing food and eating?: No  Bathing or showering? No  Getting dressed: No  Getting to the toilet? No  Using the toilet:No  Moving around from place to place: No  In the past year have you fallen or had a near fall?:No    Hearing Difficulties: No  Do you often ask people to speak up or repeat themselves? No  Do you experience ringing or noises in your ears? No Do you have difficulty understanding soft or whispered voices? No  Do you feel that you have a problem with memory? No Do you often misplace items? No  Do you feel safe at home? Yes  Cognitive Testing  Alert? Yes Normal Appearance?Yes  Oriented to person? Yes Place? Yes  Time? Yes  Recall of three objects? Yes  Can perform simple calculations? Yes  Displays appropriate judgment?Yes  Can read the correct time from a watch face?Yes   List the Names of Other Physician/Practitioners you currently use:   Orthopedics  My eye doctor [facility]  Cardiology  Hematology     Screening Tests / Date Colonoscopy  UTD                 Zostavax  UTD Pneumonia- UTD Influenza Vaccine  UTD Tetanus/tdap UTD COVID-19  UTD, due for booster   ROS: GEN- denies fatigue, fever, weight loss,weakness, recent illness HEENT- denies eye drainage, change in vision, nasal discharge, CVS- denies chest pain, palpitations RESP- denies SOB, cough, wheeze ABD- denies N/V, change in stools, abd pain GU- denies dysuria, hematuria, dribbling, incontinence MSK- denies joint pain, muscle aches, injury Neuro- denies headache, dizziness, syncope, seizure activity  Physical: GEN- NAD, alert and oriented x3 HEENT- PERRL, EOMI, non injected sclera, pink conjunctiva, MMM,  oropharynx clear, TM clear bilat no effusio Neck- Supple, no thyromegaly, no bruit  CVS- RRR, no murmur RESP-CTAB ABD-NABS,soft,NT,ND EXT- No edema Pulses- Radial, DP- 2+ Skin - erythematous macules- healed, NT, no raised lesions     Assessment:    Annual wellness medicare exam   Plan:    During the course of the visit the patient was educated and counseled about appropriate screening and preventive services including:     Audit C depression screen negative  BPH - continue  Flomax to 0.8 mg   Aortic atherosclerosis on Repatha, lipids at goal, LFT normal    Diabetes mellitus A1C 6.3%, controlling with diet   He had decreased monofilament, yet no symptoms from it concerning for him   COPD-increased difficulty breathing.  Mostly with exertion.  He has been evaluated by cardiology his echo get the CT of chest.  We will repeat his pulmonary function test and get an appointment with a pulmonologist.  He wants to try going back on a different inhaler to see if it helps.  He has been on albuterol as well as Symbicort with no improvement will bump him up to trilogy and see how he does.  He does have underlying emphysema as well.  Lesions on legs are not infected just scars from injection  Diet review for nutrition referral? Yes ____ Not Indicated __x__  Patient Instructions (the written plan) was given to the patient.  Medicare Attestation  I have personally reviewed:  The patient's medical and social history  Their use of alcohol, tobacco or illicit drugs  Their current medications and supplements  The patient's functional ability including ADLs,fall risks, home safety risks, cognitive, and hearing and visual impairment  Diet and physical activities  Evidence for depression or mood disorders  The patient's weight, height, BMI, and visual acuity have been recorded in the chart. I have made referrals, counseling, and provided education to the patient based on review of the above  and I have provided the patient with a written personalized care plan for preventive services.

## 2020-10-02 ENCOUNTER — Telehealth: Payer: Self-pay | Admitting: Pharmacist

## 2020-10-02 NOTE — Progress Notes (Addendum)
Chronic Care Management Pharmacy Assistant   Name: Benjamin [name]  MRN: [id number] DOB: 03/21/1941  Reason for Encounter: Disease State for DM.  Patient Questions:  1.  Have you seen any other providers since your last visit? Yes.   2.  Any changes in your medicines or health? Yes.  PCP : Alycia Rossetti, MD  Their chronic conditions include: CHF, CAD, COPD, GERD, Type II DM, Chronic back pain.   Office Visits: 09/06/20 Dr. Buelah Manis routine check up. Increased shortness of breath. STARTED Albuterol Sulfate 108 (90 Base) MCG/ACT and Fluticasone-Umeclidn-Vilant 100-62.5-25 MCG/INH. Consults: 07/07/20 Ortho Dr. French Ana No information given. 10/21.21 Oncology Dr. Delton Coombes No medication changes.  06/30/20 Deliah Goody Irritable Bowel Syndrome PA No information given. 06/23/20 Cardio Dr. Domenic Polite COMPLETED Cyanocobalamin. STOPPED Ipratropium Albuterol 0.5-2.5.  Allergies:   Allergies  Allergen Reactions   Arsenic Swelling    Severe swelling if patient comes in contact    Contrast Media [Iodinated Diagnostic Agents] Swelling and Rash   Statins Rash    Joint pain    Medications: Outpatient Encounter Medications as of 10/02/2020  Medication Sig   albuterol (VENTOLIN HFA) 108 (90 Base) MCG/ACT inhaler Inhale 2 puffs into the lungs every 4 (four) hours as needed for wheezing or shortness of breath.   colestipol (COLESTID) 1 g tablet Take by mouth.   CRANBERRY EXTRACT PO Take 300 mg by mouth 2 (two) times daily.   Evolocumab (REPATHA SURECLICK) 166 MG/ML SOAJ Inject 1 Dose into the skin every 14 (fourteen) days.   famotidine (PEPCID) 40 MG tablet Take 40 mg by mouth daily.   Fluticasone-Umeclidin-Vilant (TRELEGY ELLIPTA) 100-62.5-25 MCG/INH AEPB 1 puff daily   metoprolol tartrate (LOPRESSOR) 25 MG tablet Take 0.5 tablets (12.5 mg total) by mouth 2 (two) times daily.   pantoprazole (PROTONIX) 40 MG tablet Take 40 mg by mouth as needed.    tamsulosin (FLOMAX) 0.4 MG CAPS capsule TAKE 2  CAPSULES BY MOUTH ONCE DAILY AFTER SUPPER FOR PROSTATE   No facility-administered encounter medications on file as of 10/02/2020.    Current Diagnosis: Patient Active Problem List   Diagnosis Date Noted   Thrombocytopenia ([place]) 01/23/2020   Leukopenia 01/21/2020   Chronic allergic rhinitis 12/21/2019   Chronic pain syndrome 12/21/2019   Chronic insomnia 12/20/2019   AK (actinic keratosis) 03/17/2019   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   CAD (coronary artery disease) 02/05/2019   Aortic atherosclerosis ([place]) 09/22/2018   Type 2 diabetes mellitus without complications ([place]) 03/15/1600   Chronic diastolic CHF (congestive heart failure) (H[facility])    Irritable bowel syndrome with diarrhea    Coronary artery calcification seen on CAT scan    Chronic bilateral thoracic back pain 02/12/2017   GERD (gastroesophageal reflux disease) 11/27/2016   Constipation 10/28/2016   Erectile dysfunction 06/25/2016   Chronic diarrhea 06/07/2016   Duodenal ulcer 02/19/2016   COPD (chronic obstructive pulmonary disease) ([place]) 10/20/2015   DDD (degenerative disc disease), lumbar 01/11/2014   BPH (benign prostatic hyperplasia) 03/09/2012   Cataract 03/09/2012    Goals Addressed   None    Recent Relevant Labs: Lab Results  Component Value Date/Time   HGBA1C 6.3 (H) 09/05/2020 09:29 AM   HGBA1C 5.9 (H) 04/28/2020 10:45 AM   MICROALBUR 0.5 09/05/2020 09:29 AM    Kidney Function Lab Results  Component Value Date/Time   CREATININE 1.21 (H) 09/05/2020 09:29 AM   CREATININE 1.06 07/06/2020 03:07 PM   CREATININE 1.03 02/29/2020 11:05 AM   CREATININE 1.14  12/16/2019 02:03 PM   GFRNONAA 57 (L) 09/05/2020 09:29 AM   GFRAA 66 09/05/2020 09:29 AM    Current antihyperglycemic regimen:  No Medications   What recent interventions/DTPs have been made to improve glycemic control:  None  Have there been any recent hospitalizations or ED visits since last visit with CPP?   No.  Patient  reports hypoglycemic symptoms, including None   Patient reports hyperglycemic symptoms, including none   How often are you checking your blood sugar? Patient stated he does not have to currently check his sugar at this time, Dr. Buelah Manis does check his A1C when he comes into the office.    What are your blood sugars ranging?  Fasting: N/A Before meals: N/A After meals: N/A Bedtime: N/A  During the week, how often does your blood glucose drop below 70? Patient stated he does not have to currently check his sugar at this time since he's pre-diabetic.   Are you checking your feet daily/regularly?   Patient stated he checks his feet daily.  Adherence Review: Is the patient currently on a STATIN medication? No.  Is the patient currently on ACE/ARB medication? No.   Does the patient have >5 day gap between last estimated fill dates? No, CPP Please Check  Patient stated he gets his medication from [place] and does not have any concerns or questions about his medication at this time.   Follow-Up:  Pharmacist Review   Charlann Lange, RMA Clinical Pharmacist Assistant [phone]   3 minutes spent in review, coordination, and documentation.  Reviewed by: Beverly Milch, PharmD Clinical Pharmacist [facility] [phone]

## 2020-10-02 NOTE — Progress Notes (Signed)
    Chronic Care Management Pharmacy Assistant   Name: Phat Dalton  MRN: [id number] DOB: 1941-02-12  ERROR

## 2020-10-11 ENCOUNTER — Telehealth: Payer: Self-pay | Admitting: Internal Medicine

## 2020-10-11 NOTE — Telephone Encounter (Signed)
PA for repatha sureclick 140mg /mL approved 09/03/2020 - 10/03/2021 per [employer] FEP

## 2020-10-12 IMAGING — DX DG SHOULDER 2+V*R*
2 series · 2 of 2 positions shown · non-contrast
Comparison: None.

CLINICAL DATA: Fall, shoulder pain

EXAM:
RIGHT SHOULDER - 2+ VIEW

[shoulder obl]
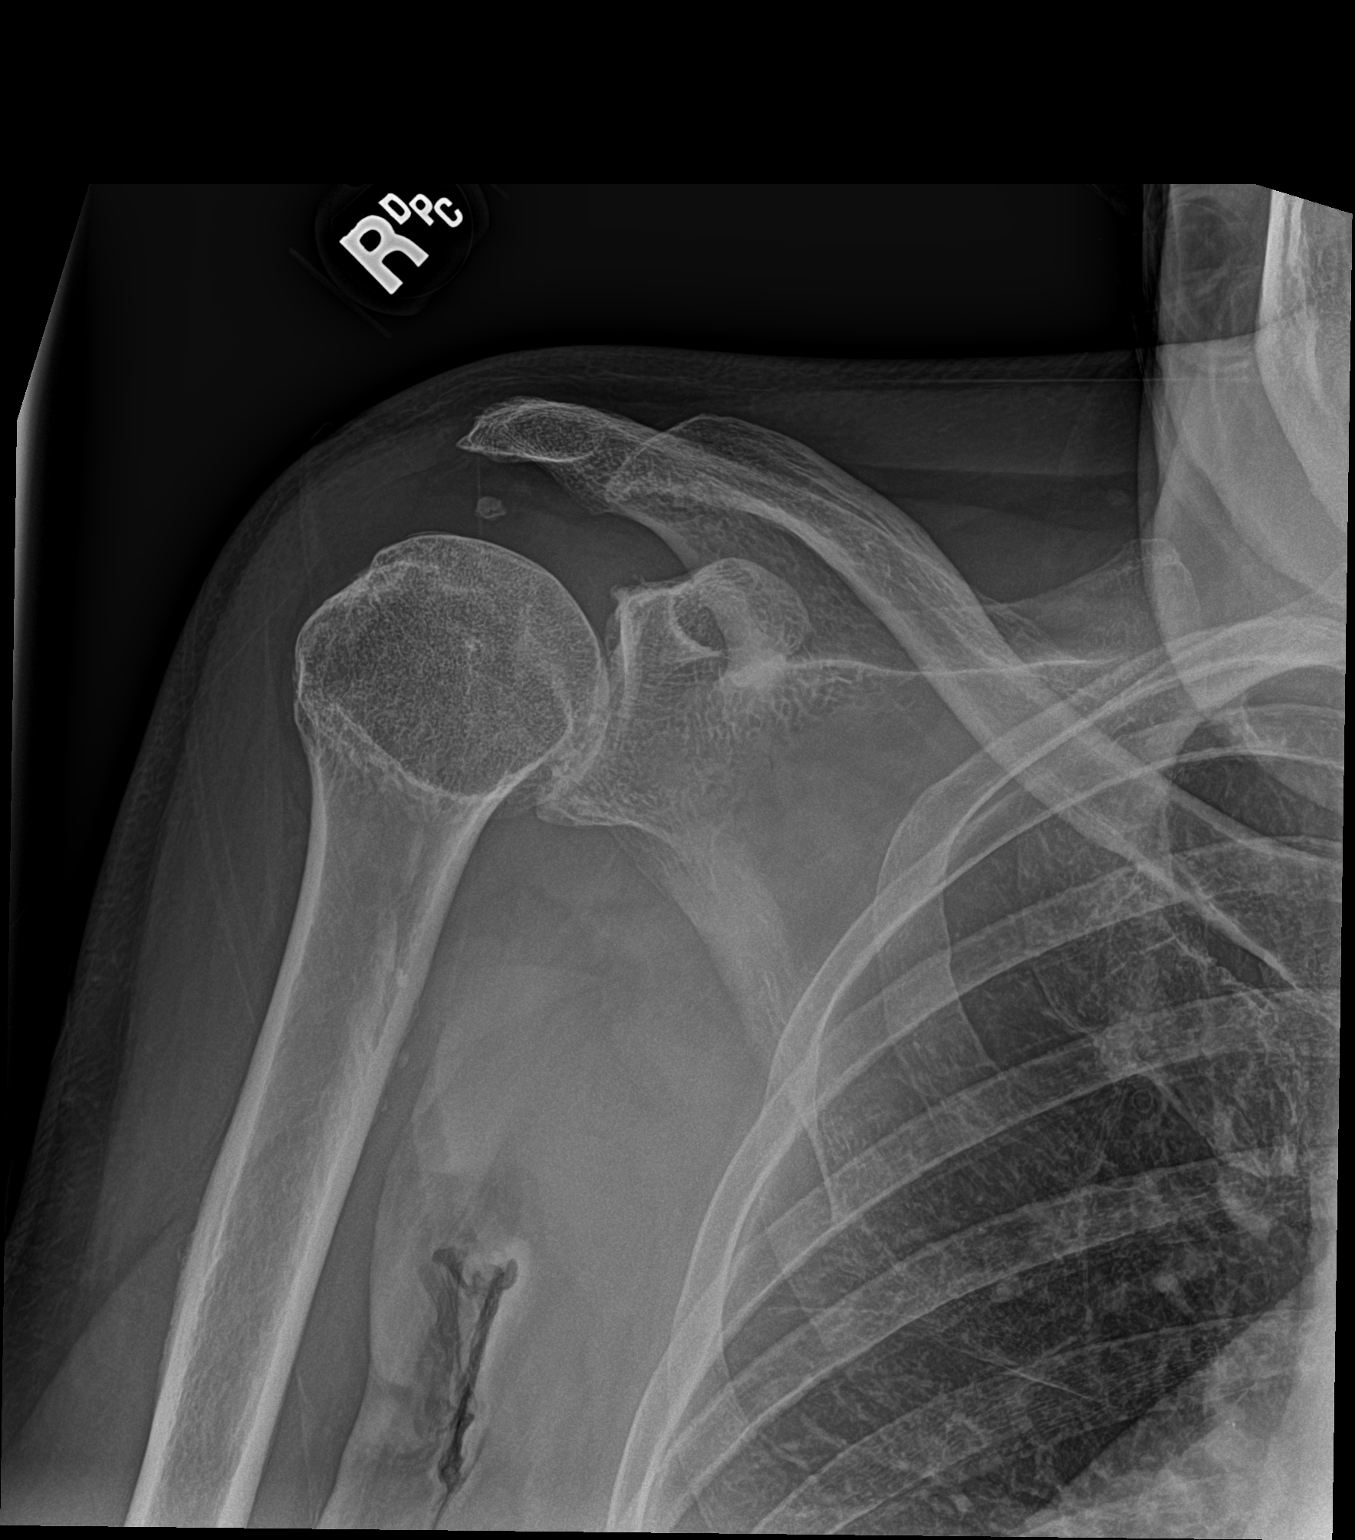

[scapula lat]
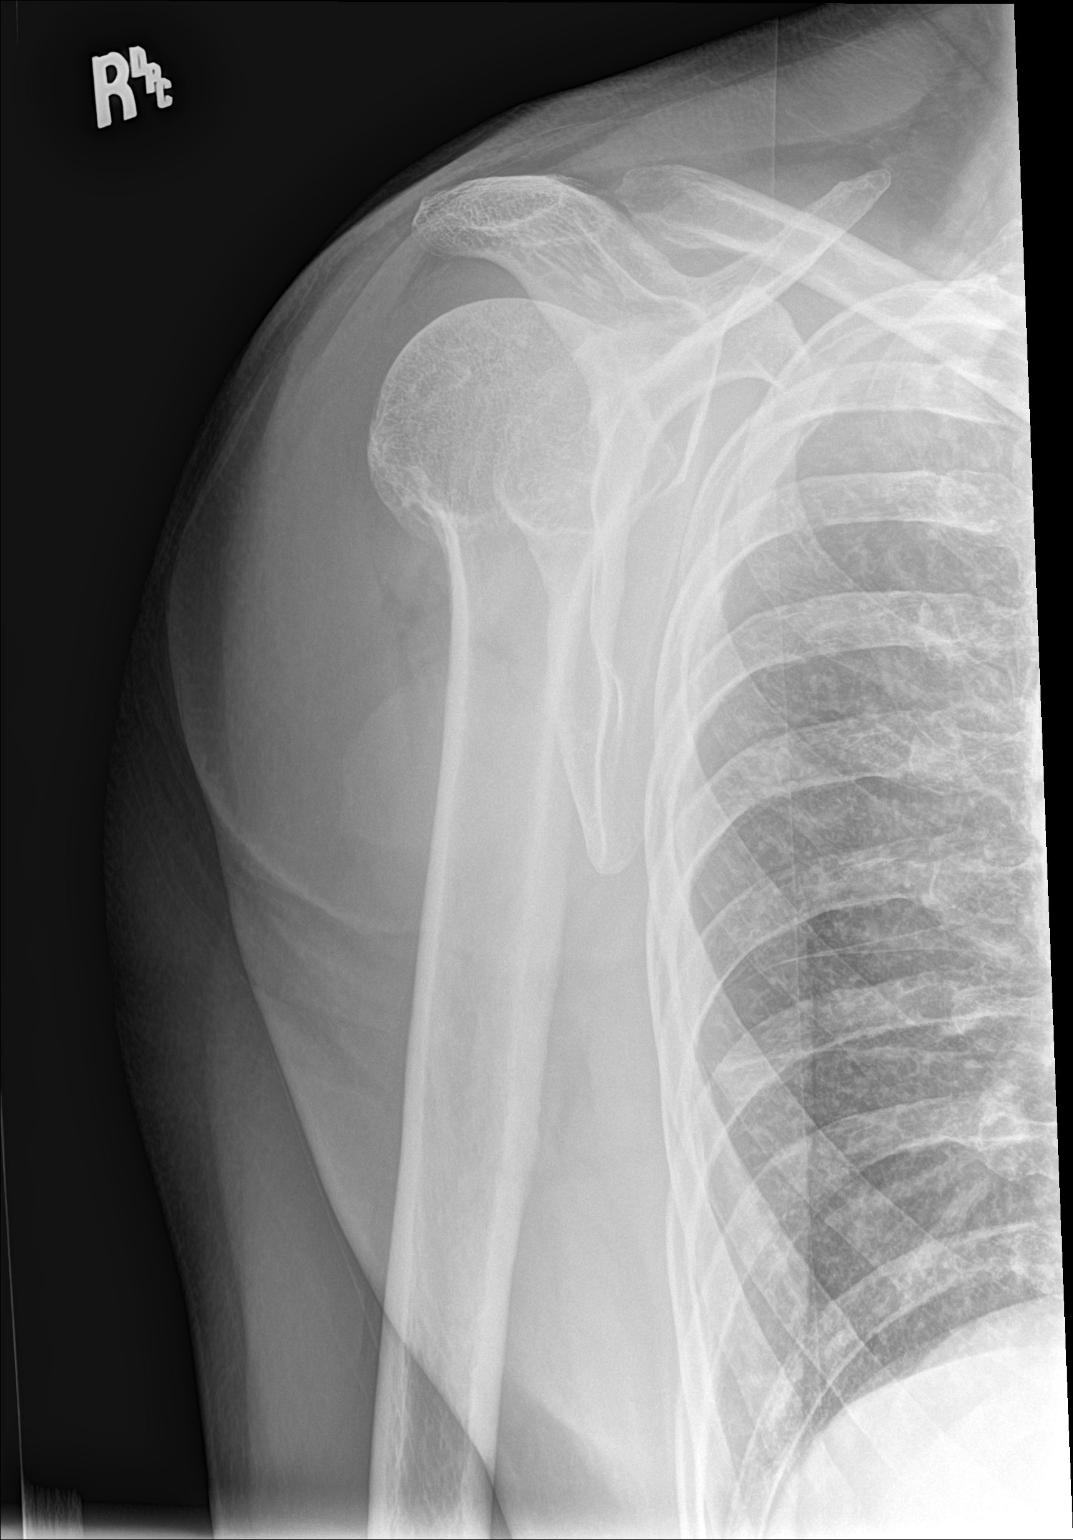

[2 of 2 positions shown; findings below may reference images not displayed]

FINDINGS: Degenerative changes in the right AC and glenohumeral joints with
joint space narrowing and spurring. No acute bony abnormality.
Specifically, no fracture, subluxation, or dislocation.
IMPRESSION: No acute bony abnormality.

## 2020-10-18 DIAGNOSIS — H04203 Unspecified epiphora, bilateral lacrimal glands: Secondary | ICD-10-CM | POA: Diagnosis not present

## 2020-10-18 DIAGNOSIS — H04221 Epiphora due to insufficient drainage, right lacrimal gland: Secondary | ICD-10-CM | POA: Diagnosis not present

## 2020-10-18 DIAGNOSIS — H04123 Dry eye syndrome of bilateral lacrimal glands: Secondary | ICD-10-CM | POA: Diagnosis not present

## 2020-10-18 DIAGNOSIS — H02132 Senile ectropion of right lower eyelid: Secondary | ICD-10-CM | POA: Diagnosis not present

## 2020-10-18 DIAGNOSIS — H04561 Stenosis of right lacrimal punctum: Secondary | ICD-10-CM | POA: Diagnosis not present

## 2020-10-18 DIAGNOSIS — H0289 Other specified disorders of eyelid: Secondary | ICD-10-CM | POA: Diagnosis not present

## 2020-10-18 DIAGNOSIS — H02135 Senile ectropion of left lower eyelid: Secondary | ICD-10-CM | POA: Diagnosis not present

## 2020-10-21 ENCOUNTER — Other Ambulatory Visit: Payer: Self-pay | Admitting: Family Medicine

## 2020-11-01 ENCOUNTER — Telehealth: Payer: Self-pay | Admitting: *Deleted

## 2020-11-01 NOTE — Telephone Encounter (Signed)
Received VM from patient.   Reports that letter from was received from PCP in regards to her leaving office. Inquired as to how they can establish within office.   Dr Pickard is unable to accept new patients at this time.   PCP advised to call one of the providers on the list with the letter to establish.   Verbalized understanding.  

## 2020-11-17 DIAGNOSIS — H0289 Other specified disorders of eyelid: Secondary | ICD-10-CM | POA: Diagnosis not present

## 2020-11-17 DIAGNOSIS — H04541 Stenosis of right lacrimal canaliculi: Secondary | ICD-10-CM | POA: Diagnosis not present

## 2020-11-17 DIAGNOSIS — H02132 Senile ectropion of right lower eyelid: Secondary | ICD-10-CM | POA: Diagnosis not present

## 2020-11-17 DIAGNOSIS — H02135 Senile ectropion of left lower eyelid: Secondary | ICD-10-CM | POA: Diagnosis not present

## 2020-11-17 DIAGNOSIS — H04222 Epiphora due to insufficient drainage, left lacrimal gland: Secondary | ICD-10-CM | POA: Diagnosis not present

## 2020-11-17 DIAGNOSIS — H04561 Stenosis of right lacrimal punctum: Secondary | ICD-10-CM | POA: Diagnosis not present

## 2020-11-17 DIAGNOSIS — H04221 Epiphora due to insufficient drainage, right lacrimal gland: Secondary | ICD-10-CM | POA: Diagnosis not present

## 2020-11-17 HISTORY — PX: EYE SURGERY: SHX253

## 2020-11-18 DIAGNOSIS — H0489 Other disorders of lacrimal system: Secondary | ICD-10-CM | POA: Diagnosis not present

## 2020-11-18 DIAGNOSIS — H04411 Chronic dacryocystitis of right lacrimal passage: Secondary | ICD-10-CM | POA: Diagnosis not present

## 2020-11-20 ENCOUNTER — Institutional Professional Consult (permissible substitution): Payer: Medicare Other | Admitting: Internal Medicine

## 2020-11-22 ENCOUNTER — Other Ambulatory Visit: Payer: Self-pay

## 2020-11-22 ENCOUNTER — Encounter: Payer: Self-pay | Admitting: Nurse Practitioner

## 2020-11-22 ENCOUNTER — Ambulatory Visit (INDEPENDENT_AMBULATORY_CARE_PROVIDER_SITE_OTHER): Payer: Medicare Other | Admitting: Nurse Practitioner

## 2020-11-22 VITALS — BP 104/69 | HR 86 | Temp 98.3°F | Resp 20 | Ht 72.0 in | Wt 189.7 lb

## 2020-11-22 DIAGNOSIS — R197 Diarrhea, unspecified: Secondary | ICD-10-CM | POA: Diagnosis not present

## 2020-11-22 DIAGNOSIS — N400 Enlarged prostate without lower urinary tract symptoms: Secondary | ICD-10-CM

## 2020-11-22 DIAGNOSIS — Z7689 Persons encountering health services in other specified circumstances: Secondary | ICD-10-CM | POA: Diagnosis not present

## 2020-11-22 DIAGNOSIS — Z951 Presence of aortocoronary bypass graft: Secondary | ICD-10-CM

## 2020-11-22 DIAGNOSIS — K5900 Constipation, unspecified: Secondary | ICD-10-CM

## 2020-11-22 NOTE — Assessment & Plan Note (Signed)
-  transferring from Dr. Buelah Manis

## 2020-11-22 NOTE — Assessment & Plan Note (Signed)
-  referral to urology -last PSA was WNL

## 2020-11-22 NOTE — Assessment & Plan Note (Signed)
-  followed by [name]

## 2020-11-22 NOTE — Assessment & Plan Note (Addendum)
-  still has sternal itching -takes repatha -followed by Dr. Domenic Polite and Dr. Debara Pickett
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
## 2020-11-22 NOTE — Patient Instructions (Signed)
We will meet up in 1 month for labs. Please get fasting labs drawn 2-3 days prior to the next visit.

## 2020-11-28 ENCOUNTER — Other Ambulatory Visit: Payer: Self-pay | Admitting: Internal Medicine

## 2020-11-28 DIAGNOSIS — E782 Mixed hyperlipidemia: Secondary | ICD-10-CM

## 2020-11-30 ENCOUNTER — Encounter: Payer: Self-pay | Admitting: Nurse Practitioner

## 2020-11-30 ENCOUNTER — Other Ambulatory Visit: Payer: Self-pay

## 2020-11-30 ENCOUNTER — Telehealth (INDEPENDENT_AMBULATORY_CARE_PROVIDER_SITE_OTHER): Payer: Medicare Other | Admitting: Nurse Practitioner

## 2020-11-30 DIAGNOSIS — J069 Acute upper respiratory infection, unspecified: Secondary | ICD-10-CM

## 2020-11-30 MED ORDER — CORICIDIN HBP COUGH/COLD 4-30 MG PO TABS: 1.0000 | ORAL_TABLET | Freq: Four times a day (QID) | ORAL | 0 refills | Status: DC | PRN

## 2020-11-30 MED ORDER — AZITHROMYCIN 250 MG PO TABS: ORAL_TABLET | ORAL | 0 refills | Status: DC
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
## 2020-11-30 NOTE — Assessment & Plan Note (Signed)
-  Rx. z-pack -he will complete home COVID test and call with results -temp 98.6 -Rx. coricidin

## 2020-11-30 NOTE — Progress Notes (Deleted)
Chronic Care Management Pharmacy Note  11/30/2020 Name:  Benjamin [name] MRN:  [id number] DOB:  19-Mar-1941  Subjective: Benjamin [name] is an 80 y.o. year old male who is a primary patient of Noreene Larsson, NP.  The CCM team was consulted for assistance with disease management and care coordination needs.    Engaged with patient by telephone for follow up visit in response to provider referral for pharmacy case management and/or care coordination services.   Consent to Services:  The patient was given the following information about Chronic Care Management services today, agreed to services, and gave verbal consent: 1. CCM service includes personalized support from designated clinical staff supervised by the primary care provider, including individualized plan of care and coordination with other care providers 2. 24/7 contact phone numbers for assistance for urgent and routine care needs. 3. Service will only be billed when office clinical staff spend 20 minutes or more in a month to coordinate care. 4. Only one practitioner may furnish and bill the service in a calendar month. 5.The patient may stop CCM services at any time (effective at the end of the month) by phone call to the office staff. 6. The patient will be responsible for cost sharing (co-pay) of up to 20% of the service fee (after annual deductible is met). Patient agreed to services and consent obtained.  Patient Care Team: Noreene Larsson, NP as PCP - General (Nurse Practitioner) Satira Sark, MD as PCP - Cardiology (Cardiology) [name], MD (Inactive) as Consulting Physician (Gastroenterology) Derek Jack, MD as Consulting Physician (Hematology) Edythe Clarity, [facility] as Pharmacist (Pharmacist)  Recent office visits: ***  Recent consult visits: [facility] visits: {Hospital DC Yes/No:25215}  Objective:  Lab Results  Component Value Date   CREATININE 1.21 (H) 09/05/2020   BUN 16 09/05/2020   GFRNONAA  57 (L) 09/05/2020   GFRAA 66 09/05/2020   NA 143 09/05/2020   K 4.7 09/05/2020   CALCIUM 9.2 09/05/2020   CO2 27 09/05/2020   GLUCOSE 107 (H) 09/05/2020    Lab Results  Component Value Date/Time   HGBA1C 6.3 (H) 09/05/2020 09:29 AM   HGBA1C 5.9 (H) 04/28/2020 10:45 AM   MICROALBUR 0.5 09/05/2020 09:29 AM    Last diabetic Eye exam:  Lab Results  Component Value Date/Time   HMDIABEYEEXA No Retinopathy 05/19/2018 12:00 AM    Last diabetic Foot exam: No results found for: HMDIABFOOTEX   Lab Results  Component Value Date   CHOL 79 09/05/2020   HDL 41 09/05/2020   LDLCALC 20 09/05/2020   TRIG 101 09/05/2020   CHOLHDL 1.9 09/05/2020    Hepatic Function Latest Ref Rng & Units 09/05/2020 07/06/2020 02/29/2020  Total Protein 6.1 - 8.1 g/dL 6.1 7.2 6.6  Albumin 3.5 - 5.0 g/dL - 4.2 3.8  AST 10 - 35 U/L _0 ALT 9 - 46 U/L 21 32 29  Alk Phosphatase 38 - 126 U/L - 50 46  Total Bilirubin 0.2 - 1.2 mg/dL 1.1 1.7(H) 1.0  Bilirubin, Direct 0.0 - 0.2 mg/dL - - -    Lab Results  Component Value Date/Time   TSH 3.04 10/02/2018 04:18 PM   TSH 1.51 05/13/2016 04:30 PM   FREET4 1.1 10/02/2018 04:18 PM    CBC Latest Ref Rng & Units 09/05/2020 07/06/2020 02/29/2020  WBC 3.8 - 10.8 Thousand/uL 8.0 6.8 6.1  Hemoglobin 13.2 - 17.1 g/dL 15.5 15.7 14.7  Hematocrit 38.5 - 50.0 % 45.3 47.8  44.9  Platelets 140 - 400 Thousand/uL 284 278 313    No results found for: VD25OH  Clinical ASCVD: {YES/NO:21197} The ASCVD Risk score [name], et al., 2013) failed to calculate for the following reasons:   The valid total cholesterol range is 130 to 320 mg/dL    Depression screen [facility] 2/9 11/30/2020 11/22/2020 09/06/2020  Decreased Interest 0 0 0  Down, Depressed, Hopeless 0 0 -  PHQ - 2 Score 0 0 0  Altered sleeping - - -  Tired, decreased energy - - -  Change in appetite - - -  Feeling bad or failure about yourself  - - -  Trouble concentrating - - -  Moving slowly or fidgety/restless -  - -  Suicidal thoughts - - -  PHQ-9 Score - - -  Difficult doing work/chores - - -  Some recent data might be hidden     ***Other: (CHADS2VASc if Afib, MMRC or CAT for COPD, ACT, DEXA)  Social History   Tobacco Use  Smoking Status Former Smoker  . Packs/day: 0.50  . Years: 50.00  . Pack years: 25.00  . Types: Cigarettes  . Quit date: 02/14/2014  . Years since quitting: 6.7  Smokeless Tobacco Never Used  Tobacco Comment   Quit x 1 year   BP Readings from Last 3 Encounters:  11/22/20 104/69  09/06/20 120/72  07/06/20 95/70   Pulse Readings from Last 3 Encounters:  11/22/20 86  09/06/20 61  07/06/20 93   Wt Readings from Last 3 Encounters:  11/22/20 189 lb 11.2 oz (86 kg)  09/06/20 189 lb (85.7 kg)  07/06/20 189 lb 3.2 oz (85.8 kg)   BMI Readings from Last 3 Encounters:  11/22/20 25.73 kg/m  09/06/20 25.63 kg/m  07/06/20 25.66 kg/m    Assessment/Interventions: Review of patient past medical history, allergies, medications, health status, including review of consultants reports, laboratory and other test data, was performed as part of comprehensive evaluation and provision of chronic care management services.   SDOH:  (Social Determinants of Health) assessments and interventions performed: {yes/no:20286}   CCM Care Plan  Allergies  Allergen Reactions  . Arsenic Swelling    Severe swelling if patient comes in contact   . Contrast Media [Iodinated Diagnostic Agents] Swelling and Rash  . Statins Rash    Joint pain    Medications Reviewed Today    Reviewed by Noreene Larsson, NP (Nurse Practitioner) on 11/30/20 at [phone]  Med List Status: <None>  Medication Order Taking? Sig Documenting Provider Last Dose Status Informant  albuterol (VENTOLIN HFA) 108 (90 Base) MCG/ACT inhaler [id number] Yes Inhale 2 puffs into the lungs every 4 (four) hours as needed for wheezing or shortness of breath. Alycia Rossetti, MD Taking Active   azithromycin [facility]) 250 MG tablet  [id number] Yes Please dispense as a z-pack Noreene Larsson, NP  Active   Chlorpheniramine-DM (CORICIDIN COUGH/COLD) 4-30 MG TABS [id number] Yes Take 1 tablet by mouth every 6 (six) hours as needed. Noreene Larsson, NP  Active   colestipol (COLESTID) 1 g tablet [id number] Yes Take by mouth. [provider] Taking Active   CRANBERRY EXTRACT PO [id number] Yes Take 300 mg by mouth 2 (two) times daily. [provider] Taking Active Self  Evolocumab (REPATHA SURECLICK) 315 MG/ML SOAJ [id number] Yes Inject 1 Dose into the skin every 14 (fourteen) days. Pixie Casino, MD Taking Active Self  famotidine (PEPCID) 40 MG tablet [id number] Yes Take 40 mg by mouth  daily. [provider] Taking Active   Fluticasone-Umeclidin-Vilant (TRELEGY ELLIPTA) 100-62.5-25 MCG/INH AEPB [id number] Yes 1 puff daily [place], Modena Nunnery, MD Taking Active   metoprolol tartrate (LOPRESSOR) 25 MG tablet [id number] Yes Take 0.5 tablets (12.5 mg total) by mouth 2 (two) times daily. Satira Sark, MD Taking Active   pantoprazole (PROTONIX) 40 MG tablet [id number] Yes Take 40 mg by mouth as needed.  [provider] Taking Active Self  tamsulosin (FLOMAX) 0.4 MG CAPS capsule [id number] Yes TAKE 2 CAPSULES BY MOUTH ONCE DAILY AFTER SUPPER FOR PROSTATE [place], Modena Nunnery, MD Taking Active           Patient Active Problem List   Diagnosis Date Noted  . URI (upper respiratory infection) 11/30/2020  . Thrombocytopenia ([place]) 01/23/2020  . Chronic allergic rhinitis 12/21/2019  . Chronic pain syndrome 12/21/2019  . Chronic insomnia 12/20/2019  . AK (actinic keratosis) 03/17/2019  . 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 CAD (coronary artery disease) 02/05/2019  . Aortic atherosclerosis ([place]) 09/22/2018  . Type 2 diabetes mellitus without complications ([place]) 24/40/1027  . Chronic diastolic CHF (congestive heart failure) ([place])   . Irritable bowel syndrome with diarrhea   . Coronary artery  calcification seen on CAT scan   . Chronic bilateral thoracic back pain 02/12/2017  . GERD (gastroesophageal reflux disease) 11/27/2016  . Constipation 10/28/2016  . Erectile dysfunction 06/25/2016  . Chronic diarrhea 06/07/2016  . Encounter to establish care 01/02/2016  . COPD (chronic obstructive pulmonary disease) ([place]) 10/20/2015  . DDD (degenerative disc disease), lumbar 01/11/2014  . BPH (benign prostatic hyperplasia) 03/09/2012  . Cataract 03/09/2012    Immunization History  Administered Date(s) Administered  . Fluad Quad(high Dose 65+) 05/25/2019, 06/16/2020  . Hep A / Hep B 10/23/2018, 11/23/2018, 04/21/2019  . Influenza Split 08/10/2004, 07/30/2005, 07/16/2007, 07/01/2012  . Influenza, High Dose Seasonal PF 06/17/2017  . Influenza,inj,Quad PF,6+ Mos 07/19/2013, 07/17/2015, 06/25/2016, 06/13/2017, 07/09/2018  . Influenza-Unspecified 08/27/2000, 05/17/2010, 06/16/2014  . PFIZER(Purple Top)SARS-COV-2 Vaccination 11/21/2019, 12/12/2019  . Pneumococcal Conjugate-13 02/19/2016  . Pneumococcal Polysaccharide-23 11/13/2011  . Td 07/12/2005  . Tdap 10/30/2012, 03/30/2017  . Zoster 11/13/2011    Conditions to be addressed/monitored:  {USCCMDZASSESSMENTOPTIONS:23563}  There are no care plans that you recently modified to display for this patient.    Medication Assistance: {MEDASSISTANCEINFO:25044}  Patient's preferred pharmacy is:  [facility] [place], [place] - [place] [address] [place] [address] Phone: [phone] Fax: [phone]  [facility] [place], [place] [address] Phone: [phone] Fax: [phone]  Uses pill box? {Yes or If no, why not?:20788} Pt endorses ***% compliance  We discussed: {Pharmacy options:24294} Patient decided to: {US Pharmacy [facility]  Care Plan and Follow Up Patient Decision:  {FOLLOWUP:24991}  Plan:  {CM FOLLOW UP GURK:27062}  ***    Chronic Care Management Pharmacy Note  11/30/2020 Name:  Benjamin [name] MRN:  [id number] DOB:  18-Dec-1940  Subjective: Benjamin [name] is an 80 y.o. year old male who is a primary patient of Noreene Larsson, NP.  The CCM team was consulted for assistance with disease management and care coordination needs.    {CCMTELEPHONEFACETOFACE:21091510} for {CCMINITIALFOLLOWUPCHOICE:21091511} in response to provider referral for pharmacy case management and/or care coordination services.   Consent to Services:  {CCMCONSENTOPTIONS:25074}  Patient Care Team: Noreene Larsson, NP as PCP - General (Nurse Practitioner) Satira Sark,  MD as PCP - Cardiology (Cardiology) [name], MD (Inactive) as Consulting Physician (Gastroenterology) Derek Jack, MD as Consulting Physician (Hematology) Edythe Clarity, [facility] as Pharmacist (Pharmacist)  Recent office visits: ***  Recent consult visits: [facility] visits: {Hospital DC Yes/No:25215}  Objective:  Lab Results  Component Value Date   CREATININE 1.21 (H) 09/05/2020   BUN 16 09/05/2020   GFRNONAA 57 (L) 09/05/2020   GFRAA 66 09/05/2020   NA 143 09/05/2020   K 4.7 09/05/2020   CALCIUM 9.2 09/05/2020   CO2 27 09/05/2020   GLUCOSE 107 (H) 09/05/2020    Lab Results  Component Value Date/Time   HGBA1C 6.3 (H) 09/05/2020 09:29 AM   HGBA1C 5.9 (H) 04/28/2020 10:45 AM   MICROALBUR 0.5 09/05/2020 09:29 AM    Last diabetic Eye exam:  Lab Results  Component Value Date/Time   HMDIABEYEEXA No Retinopathy 05/19/2018 12:00 AM    Last diabetic Foot exam: No results found for: HMDIABFOOTEX   Lab Results  Component Value Date   CHOL 79 09/05/2020   HDL 41 09/05/2020   LDLCALC 20 09/05/2020   TRIG 101 09/05/2020   CHOLHDL 1.9 09/05/2020    Hepatic Function Latest Ref Rng & Units 09/05/2020 07/06/2020 02/29/2020  Total Protein 6.1 - 8.1 g/dL 6.1 7.2 6.6  Albumin 3.5 - 5.0 g/dL - 4.2 3.8  AST  10 - 35 U/L _0 ALT 9 - 46 U/L 21 32 29  Alk Phosphatase 38 - 126 U/L - 50 46  Total Bilirubin 0.2 - 1.2 mg/dL 1.1 1.7(H) 1.0  Bilirubin, Direct 0.0 - 0.2 mg/dL - - -    Lab Results  Component Value Date/Time   TSH 3.04 10/02/2018 04:18 PM   TSH 1.51 05/13/2016 04:30 PM   FREET4 1.1 10/02/2018 04:18 PM    CBC Latest Ref Rng & Units 09/05/2020 07/06/2020 02/29/2020  WBC 3.8 - 10.8 Thousand/uL 8.0 6.8 6.1  Hemoglobin 13.2 - 17.1 g/dL 15.5 15.7 14.7  Hematocrit 38.5 - 50.0 % 45.3 47.8 44.9  Platelets 140 - 400 Thousand/uL 284 278 313    No results found for: VD25OH  Clinical ASCVD: {YES/NO:21197} The ASCVD Risk score [name], et al., 2013) failed to calculate for the following reasons:   The valid total cholesterol range is 130 to 320 mg/dL    Depression screen [facility] 2/9 11/30/2020 11/22/2020 09/06/2020  Decreased Interest 0 0 0  Down, Depressed, Hopeless 0 0 -  PHQ - 2 Score 0 0 0  Altered sleeping - - -  Tired, decreased energy - - -  Change in appetite - - -  Feeling bad or failure about yourself  - - -  Trouble concentrating - - -  Moving slowly or fidgety/restless - - -  Suicidal thoughts - - -  PHQ-9 Score - - -  Difficult doing work/chores - - -  Some recent data might be hidden     ***Other: (CHADS2VASc if Afib, MMRC or CAT for COPD, ACT, DEXA)  Social History   Tobacco Use  Smoking Status Former Smoker  . Packs/day: 0.50  . Years: 50.00  . Pack years: 25.00  . Types: Cigarettes  . Quit date: 02/14/2014  . Years since quitting: 6.7  Smokeless Tobacco Never Used  Tobacco Comment   Quit x 1 year   BP Readings from Last 3 Encounters:  11/22/20 104/69  09/06/20 120/72  07/06/20 95/70   Pulse Readings from Last 3 Encounters:  11/22/20 86  09/06/20 61  07/06/20 93   Wt Readings from Last 3 Encounters:  11/22/20 189 lb 11.2 oz (86 kg)  09/06/20 189 lb (85.7 kg)  07/06/20 189 lb 3.2 oz (85.8 kg)   BMI Readings from Last 3 Encounters:   11/22/20 25.73 kg/m  09/06/20 25.63 kg/m  07/06/20 25.66 kg/m    Assessment/Interventions: Review of patient past medical history, allergies, medications, health status, including review of consultants reports, laboratory and other test data, was performed as part of comprehensive evaluation and provision of chronic care management services.   SDOH:  (Social Determinants of Health) assessments and interventions performed: {yes/no:20286}   CCM Care Plan  Allergies  Allergen Reactions  . Arsenic Swelling    Severe swelling if patient comes in contact   . Contrast Media [Iodinated Diagnostic Agents] Swelling and Rash  . Statins Rash    Joint pain    Medications Reviewed Today    Reviewed by Noreene Larsson, NP (Nurse Practitioner) on 11/30/20 at [phone]  Med List Status: <None>  Medication Order Taking? Sig Documenting Provider Last Dose Status Informant  albuterol (VENTOLIN HFA) 108 (90 Base) MCG/ACT inhaler [id number] Yes Inhale 2 puffs into the lungs every 4 (four) hours as needed for wheezing or shortness of breath. Alycia Rossetti, MD Taking Active   azithromycin [facility]) 250 MG tablet [id number] Yes Please dispense as a z-pack Noreene Larsson, NP  Active   Chlorpheniramine-DM (CORICIDIN COUGH/COLD) 4-30 MG TABS [id number] Yes Take 1 tablet by mouth every 6 (six) hours as needed. Noreene Larsson, NP  Active   colestipol (COLESTID) 1 g tablet [id number] Yes Take by mouth. [provider] Taking Active   CRANBERRY EXTRACT PO [id number] Yes Take 300 mg by mouth 2 (two) times daily. [provider] Taking Active Self  Evolocumab (REPATHA SURECLICK) 390 MG/ML SOAJ [id number] Yes Inject 1 Dose into the skin every 14 (fourteen) days. Pixie Casino, MD Taking Active Self  famotidine (PEPCID) 40 MG tablet [id number] Yes Take 40 mg by mouth daily. [provider] Taking Active   Fluticasone-Umeclidin-Vilant (TRELEGY ELLIPTA) 100-62.5-25 MCG/INH AEPB [id number] Yes 1  puff daily [place], Modena Nunnery, MD Taking Active   metoprolol tartrate (LOPRESSOR) 25 MG tablet [id number] Yes Take 0.5 tablets (12.5 mg total) by mouth 2 (two) times daily. Satira Sark, MD Taking Active   pantoprazole (PROTONIX) 40 MG tablet [id number] Yes Take 40 mg by mouth as needed.  [provider] Taking Active Self  tamsulosin (FLOMAX) 0.4 MG CAPS capsule [id number] Yes TAKE 2 CAPSULES BY MOUTH ONCE DAILY AFTER SUPPER FOR PROSTATE [place], Modena Nunnery, MD Taking Active           Patient Active Problem List   Diagnosis Date Noted  . URI (upper respiratory infection) 11/30/2020  . Thrombocytopenia ([place]) 01/23/2020  . Chronic allergic rhinitis 12/21/2019  . Chronic pain syndrome 12/21/2019  . Chronic insomnia 12/20/2019  . AK (actinic keratosis) 03/17/2019  . 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 CAD (coronary artery disease) 02/05/2019  . Aortic atherosclerosis ([place]) 09/22/2018  . Type 2 diabetes mellitus without complications ([place]) 45/36/4680  . Chronic diastolic CHF (congestive heart failure) ([place])   . Irritable bowel syndrome with diarrhea   . Coronary artery calcification seen on CAT scan   . Chronic bilateral thoracic back pain 02/12/2017  . GERD (gastroesophageal reflux disease) 11/27/2016  . Constipation 10/28/2016  . Erectile dysfunction 06/25/2016  . Chronic diarrhea 06/07/2016  . Encounter to establish care  01/02/2016  . COPD (chronic obstructive pulmonary disease) ([place]) 10/20/2015  . DDD (degenerative disc disease), lumbar 01/11/2014  . BPH (benign prostatic hyperplasia) 03/09/2012  . Cataract 03/09/2012    Immunization History  Administered Date(s) Administered  . Fluad Quad(high Dose 65+) 05/25/2019, 06/16/2020  . Hep A / Hep B 10/23/2018, 11/23/2018, 04/21/2019  . Influenza Split 08/10/2004, 07/30/2005, 07/16/2007, 07/01/2012  . Influenza, High Dose Seasonal PF 06/17/2017  . Influenza,inj,Quad PF,6+ Mos 07/19/2013, 07/17/2015,  06/25/2016, 06/13/2017, 07/09/2018  . Influenza-Unspecified 08/27/2000, 05/17/2010, 06/16/2014  . PFIZER(Purple Top)SARS-COV-2 Vaccination 11/21/2019, 12/12/2019  . Pneumococcal Conjugate-13 02/19/2016  . Pneumococcal Polysaccharide-23 11/13/2011  . Td 07/12/2005  . Tdap 10/30/2012, 03/30/2017  . Zoster 11/13/2011    Conditions to be addressed/monitored:  {USCCMDZASSESSMENTOPTIONS:23563}  There are no care plans that you recently modified to display for this patient.    Medication Assistance: {MEDASSISTANCEINFO:25044}  Patient's preferred pharmacy is:  [facility] [place], [place] - [address] [address] [place] [address] Phone: [phone] Fax: [phone]  [facility] [place], [place] [address] Phone: [phone] Fax: [phone]  Uses pill box? {Yes or If no, why not?:20788} Pt endorses ***% compliance  We discussed: {Pharmacy options:24294} Patient decided to: {US Pharmacy Plan:23885}  Care Plan and Follow Up Patient Decision:  {FOLLOWUP:24991}  Plan: {CM FOLLOW UP PLAN:25073}  ***

## 2020-12-05 ENCOUNTER — Telehealth: Payer: Self-pay

## 2020-12-05 ENCOUNTER — Telehealth (INDEPENDENT_AMBULATORY_CARE_PROVIDER_SITE_OTHER): Payer: Medicare Other | Admitting: Nurse Practitioner

## 2020-12-05 ENCOUNTER — Other Ambulatory Visit: Payer: Self-pay

## 2020-12-05 ENCOUNTER — Encounter: Payer: Self-pay | Admitting: Nurse Practitioner

## 2020-12-05 DIAGNOSIS — J069 Acute upper respiratory infection, unspecified: Secondary | ICD-10-CM

## 2020-12-05 MED ORDER — AMOXICILLIN-POT CLAVULANATE 875-125 MG PO TABS: 1.0000 | ORAL_TABLET | Freq: Two times a day (BID) | ORAL | 0 refills | Status: DC

## 2020-12-05 MED ORDER — AZELASTINE HCL 0.1 % NA SOLN: NASAL | 12 refills | Status: DC
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
## 2020-12-05 NOTE — Assessment & Plan Note (Signed)
-  COVID test was negative -finished z-pack, but still has symptms -has congestion and PND -Rx. Augmentin -Rx. Azelastine spray

## 2020-12-15 ENCOUNTER — Other Ambulatory Visit: Payer: Self-pay | Admitting: Internal Medicine

## 2020-12-18 ENCOUNTER — Encounter: Payer: Self-pay | Admitting: Internal Medicine

## 2020-12-18 ENCOUNTER — Ambulatory Visit (HOSPITAL_COMMUNITY)
Admission: RE | Admit: 2020-12-18 | Discharge: 2020-12-18 | Disposition: A | Discharge status: Home / Self Care | Payer: Medicare Other | Source: Ambulatory Visit | Attending: Internal Medicine | Admitting: Internal Medicine

## 2020-12-18 ENCOUNTER — Ambulatory Visit (INDEPENDENT_AMBULATORY_CARE_PROVIDER_SITE_OTHER): Payer: Medicare Other | Admitting: Internal Medicine

## 2020-12-18 ENCOUNTER — Telehealth: Payer: Self-pay | Admitting: Internal Medicine

## 2020-12-18 ENCOUNTER — Other Ambulatory Visit: Payer: Self-pay

## 2020-12-18 DIAGNOSIS — J90 Pleural effusion, not elsewhere classified: Secondary | ICD-10-CM | POA: Diagnosis not present

## 2020-12-18 DIAGNOSIS — J449 Chronic obstructive pulmonary disease, unspecified: Secondary | ICD-10-CM

## 2020-12-18 DIAGNOSIS — J439 Emphysema, unspecified: Secondary | ICD-10-CM | POA: Diagnosis not present

## 2020-12-18 NOTE — Progress Notes (Signed)
Benjamin [name], male    DOB: 08/11/1941     MRN: [id number]   Brief patient profile:  41 yowm quit smoking 2005 no problem then doe 2020 > cardiac w/u > cabg  Then short rehab needed and back to baseline until Dec 2021 when noted more doe when resumed previous activities he had been avoiding due to covid and found they were more more difficult due to doe eg two aisles at [employer] then sob.     History of Present Illness  12/18/2020  Pulmonary/ 1st office eval/ [facility] / [place] [facility]  Chief Complaint  Patient presents with  . Pulmonary Consult    Referred by Dr. Vic Blackbird.  Pt c/o DOE x 4 months. He gets SOB walking approx 500 yards. He is using his albuterol inhaler 3 x per wk on average.   Dyspnea:   100 yards or 3 aisles at [employer]  Cough: every night since forever  Sleep: on side bed is flat  SABA use: albuterol 30 min benefit at most but never rechallenges or prechallenges  02 none  No obvious day to day or daytime variability or assoc excess/ purulent sputum or mucus plugs or hemoptysis or cp or chest tightness, subjective wheeze or overt sinus or hb symptoms.   Sleeping as above  without nocturnal  or early am exacerbation  of respiratory  c/o's or need for noct saba. Also denies any obvious fluctuation of symptoms with weather or environmental changes or other aggravating or alleviating factors except as outlined above   No unusual exposure hx or h/o childhood pna/ asthma or knowledge of premature birth.  Current Allergies, Complete Past Medical History, Past Surgical History, Family History, and Social History were reviewed in [facility] record.  ROS  The following are not active complaints unless bolded Hoarseness, sore throat, dysphagia, dental problems, itching, sneezing,  nasal congestion or discharge of excess mucus or purulent secretions, ear ache,   fever, chills, sweats, unintended wt loss or wt gain, classically pleuritic or exertional cp,   orthopnea pnd or arm/hand swelling  or leg swelling, presyncope, palpitations, abdominal pain, anorexia, nausea, vomiting, diarrhea  or change in bowel habits or change in bladder habits, change in stools or change in urine, dysuria, hematuria,  rash, arthralgias, visual complaints, headache, numbness, weakness or ataxia or problems with walking or coordination,  change in mood or  memory.             Past Medical History:  Diagnosis Date  . Arthritis    both shoulders, neck, upper back, and both thumbs  . COPD (chronic obstructive pulmonary disease) ([place])   . Coronary artery disease    a. s/p CABG in 02/2019 with LIMA-LAD, SVG-D1, SVG-OM and SVG-RCA  . Duodenal ulcer 02/19/2016  . Enlarged prostate   . GERD (gastroesophageal reflux disease)   . Headache(784.0)   . Leukopenia 01/21/2020  . Skin cancer   . Sternal pain    Removal of sternal wires 06/2019  . Vision problems    Blind x 20 years, regained site 2000, ? optic nerve injury    Outpatient Medications Prior to Visit  Medication Sig Dispense Refill  . albuterol (VENTOLIN HFA) 108 (90 Base) MCG/ACT inhaler Inhale 2 puffs into the lungs every 4 (four) hours as needed for wheezing or shortness of breath. 8 g 2  . azelastine (ASTELIN) 0.1 % nasal spray Use 2 sprays in each nostril every 12 hours for 1 week; After that,  you may use 1 spray in each nostril twice a day as needed for allergies/congestion 30 mL 12  . colestipol (COLESTID) 1 g tablet Take by mouth.    Drusilla Kanner EXTRACT PO Take 300 mg by mouth 2 (two) times daily.    . Fluticasone-Umeclidin-Vilant (TRELEGY ELLIPTA) 100-62.5-25 MCG/INH AEPB 1 puff daily 30 each 3  . metoprolol tartrate (LOPRESSOR) 25 MG tablet Take 0.5 tablets (12.5 mg total) by mouth 2 (two) times daily. 90 tablet 3  . pantoprazole (PROTONIX) 40 MG tablet Take 40 mg by mouth as needed.     Marland Kitchen REPATHA SURECLICK 962 MG/ML SOAJ INJECT 140MG  SUBCUTANEOUSLY EVERY 14 DAYS AS DIRECTED BY MD 2 mL 11  . tamsulosin  (FLOMAX) 0.4 MG CAPS capsule TAKE 2 CAPSULES BY MOUTH ONCE DAILY AFTER SUPPER FOR PROSTATE 180 capsule 0  . amoxicillin-clavulanate (AUGMENTIN) 875-125 MG tablet Take 1 tablet by mouth 2 (two) times daily. 14 tablet 0  . Chlorpheniramine-DM (CORICIDIN COUGH/COLD) 4-30 MG TABS Take 1 tablet by mouth every 6 (six) hours as needed. 28 tablet 0  . famotidine (PEPCID) 40 MG tablet Take 40 mg by mouth daily.     No facility-administered medications prior to visit.     Objective:     BP 118/70 (BP Location: Left Arm, Cuff Size: Normal)   Pulse 64   Temp (!) 97.1 F (36.2 C) (Temporal)   Ht 6' (1.829 m)   Wt 186 lb (84.4 kg)   SpO2 95% Comment: on RA  BMI 25.23 kg/m   SpO2: 95 % (on RA)   amb stoic wm nad  HEENT : pt wearing mask not removed for exam due to covid - 19 concerns.   NECK :  without JVD/Nodes/TM/ nl carotid upstrokes bilaterally   LUNGS: no acc muscle use,  Min barrel  contour chest wall with bilateral  slightly decreased bs s audible wheeze and  without cough on insp or exp maneuvers and min  Hyperresonant  to  percussion bilaterally     CV:  RRR  no s3 or murmur or increase in P2, and no edema   ABD:  soft and nontender with pos end  insp Hoover's  in the supine position. No bruits or organomegaly appreciated, bowel sounds nl  MS:   Nl gait/  ext warm without deformities, calf tenderness, cyanosis or clubbing No obvious joint restrictions   SKIN: warm and dry without lesions    NEURO:  alert, approp, nl sensorium with  no motor or cerebellar deficits apparent.        I personally reviewed images and agree with radiology impression as follows:   Chest CT wo contrast 01/21/20 Moderate to severe centrilobular and paraseptal emphysema   CXR PA and Lateral:   12/18/2020 :    I personally reviewed images / impression as follows:   No acute changes `        Assessment   COPD GOLD 2 Quit smoking  2005 - PFT's  02/25/2019  FEV1 2.23 (71 % ) ratio 0.64  p 0 %  improvement from saba p 0 prior to study with DLCO  8.36 (32%) corrects to 2.18 (55%)  for alv volume and FV curve mildly concave   -  CT chest 01/21/2020 c/w emphysema  -   12/18/2020   Walked RA  approx   300 ft  @ slow slt unsteady pace  stopped due to  Sob lowest sats 92%   At a residual fev 1 > 2  liters he should not be limited from doing adls or slow walks and doubt he's benefitting from Trelegy at this point so ok to use it on alternate days to see what difference if any it makes pending repeat pfts   - The proper method of use, as well as anticipated side effects, of an Elipta inhaler were discussed and demonstrated to the patient using teach back method.   Advised: Make sure you check your oxygen saturation at your highest level of activity to be sure it stays over 90% and keep track of it at least once a week, more often if breathing getting worse, and let me know if losing ground   Each maintenance medication was reviewed in detail including emphasizing most importantly the difference between maintenance and prns and under what circumstances the prns are to be triggered using an action plan format where appropriate.  Total time for H and P, chart review, counseling, reviewing elipta device(s) , directly observing portions of ambulatory 02 saturation study/ and generating customized AVS unique to this new pt office visit / same day charting = 50 min                  Christinia Gully, MD 12/18/2020

## 2020-12-18 NOTE — Patient Instructions (Addendum)
Continue the trelegy but use it on even days to see what difference if any it makes in with your activity tolerance.   Work on your inhaler technique   Please remember to go to the  x-ray department  @  [facility] for your tests - we will call you with the results when they are available     Please schedule a follow up office visit in 6 weeks,    PFTS on return

## 2020-12-18 NOTE — Telephone Encounter (Signed)
cxr ordered  [facility] radiology made aware

## 2020-12-18 NOTE — Assessment & Plan Note (Addendum)
Quit smoking  2005 - PFT's  02/25/2019  FEV1 2.23 (71 % ) ratio 0.64  p 0 % improvement from saba p 0 prior to study with DLCO  8.36 (32%) corrects to 2.18 (55%)  for alv volume and FV curve mildly concave   -  CT chest 01/21/2020 c/w emphysema  -   12/18/2020   Walked RA  approx   300 ft  @ slow slt unsteady pace  stopped due to  Sob lowest sats 92%   At a residual fev 1 > 2 liters he should not be limited from doing adls or slow walks and doubt he's benefitting from Trelegy at this point so ok to use it on alternate days to see what difference if any it makes pending repeat pfts   - The proper method of use, as well as anticipated side effects, of an Elipta inhaler were discussed and demonstrated to the patient using teach back method.   Advised: Make sure you check your oxygen saturation at your highest level of activity to be sure it stays over 90% and keep track of it at least once a week, more often if breathing getting worse, and let me know if losing ground   Each maintenance medication was reviewed in detail including emphasizing most importantly the difference between maintenance and prns and under what circumstances the prns are to be triggered using an action plan format where appropriate.  Total time for H and P, chart review, counseling, reviewing elipta device(s) , directly observing portions of ambulatory 02 saturation study/ and generating customized AVS unique to this new pt office visit / same day charting = 50 min

## 2020-12-19 NOTE — Progress Notes (Signed)
Spoke with pt and notified of results per Dr. Wert. Pt verbalized understanding and denied any questions. 

## 2020-12-25 DIAGNOSIS — R7301 Impaired fasting glucose: Secondary | ICD-10-CM | POA: Diagnosis not present

## 2020-12-25 DIAGNOSIS — Z7689 Persons encountering health services in other specified circumstances: Secondary | ICD-10-CM | POA: Diagnosis not present

## 2020-12-25 DIAGNOSIS — E782 Mixed hyperlipidemia: Secondary | ICD-10-CM | POA: Diagnosis not present

## 2020-12-25 DIAGNOSIS — R197 Diarrhea, unspecified: Secondary | ICD-10-CM | POA: Diagnosis not present

## 2020-12-26 LAB — CBC WITH DIFFERENTIAL/PLATELET
Basophils Absolute: 0.1 10*3/uL (ref 0.0–0.2)
Basos: 1 %
EOS (ABSOLUTE): 0.3 10*3/uL (ref 0.0–0.4)
Eos: 4 %
Hematocrit: 49.4 % (ref 37.5–51.0)
Hemoglobin: 16.3 g/dL (ref 13.0–17.7)
Immature Grans (Abs): 0 10*3/uL (ref 0.0–0.1)
Immature Granulocytes: 0 %
Lymphocytes Absolute: 1.6 10*3/uL (ref 0.7–3.1)
Lymphs: 21 %
MCH: 30.2 pg (ref 26.6–33.0)
MCHC: 33 g/dL (ref 31.5–35.7)
MCV: 92 fL (ref 79–97)
Monocytes Absolute: 0.7 10*3/uL (ref 0.1–0.9)
Monocytes: 9 %
Neutrophils Absolute: 4.8 10*3/uL (ref 1.4–7.0)
Neutrophils: 65 %
Platelets: 244 10*3/uL (ref 150–450)
RBC: 5.39 x10E6/uL (ref 4.14–5.80)
RDW: 14.2 % (ref 11.6–15.4)
WBC: 7.4 10*3/uL (ref 3.4–10.8)

## 2020-12-26 LAB — LIPID PANEL
Chol/HDL Ratio: 1.9 ratio (ref 0.0–5.0)
Cholesterol, Total: 83 mg/dL — ABNORMAL LOW (ref 100–199)
HDL: 43 mg/dL (ref >39)
LDL Chol Calc (NIH): 23 mg/dL (ref 0–99)
Triglycerides: 83 mg/dL (ref 0–149)
VLDL Cholesterol Cal: 17 mg/dL (ref 5–40)

## 2020-12-26 LAB — CMP14+EGFR
ALT: 40 IU/L (ref 0–44)
AST: 22 IU/L (ref 0–40)
Albumin/Globulin Ratio: 2.1 (ref 1.2–2.2)
Albumin: 4.5 g/dL (ref 3.7–4.7)
Alkaline Phosphatase: 78 IU/L (ref 44–121)
BUN/Creatinine Ratio: 10 (ref 10–24)
BUN: 12 mg/dL (ref 8–27)
Bilirubin Total: 1.6 mg/dL — ABNORMAL HIGH (ref 0.0–1.2)
CO2: 19 mmol/L — ABNORMAL LOW (ref 20–29)
Calcium: 9.5 mg/dL (ref 8.6–10.2)
Chloride: 104 mmol/L (ref 96–106)
Creatinine, Ser: 1.22 mg/dL (ref 0.76–1.27)
Globulin, Total: 2.1 g/dL (ref 1.5–4.5)
Glucose: 102 mg/dL — ABNORMAL HIGH (ref 65–99)
Potassium: 4.4 mmol/L (ref 3.5–5.2)
Sodium: 141 mmol/L (ref 134–144)
Total Protein: 6.6 g/dL (ref 6.0–8.5)
eGFR: 61 mL/min/{1.73_m2} (ref >59)

## 2020-12-26 NOTE — Progress Notes (Signed)
Labs look good. We will discuss them at his appt tomorrow.

## 2020-12-27 ENCOUNTER — Ambulatory Visit (INDEPENDENT_AMBULATORY_CARE_PROVIDER_SITE_OTHER): Payer: Medicare Other | Admitting: Nurse Practitioner

## 2020-12-27 ENCOUNTER — Encounter: Payer: Self-pay | Admitting: Nurse Practitioner

## 2020-12-27 ENCOUNTER — Encounter: Payer: Self-pay | Admitting: Internal Medicine

## 2020-12-27 ENCOUNTER — Other Ambulatory Visit: Payer: Self-pay

## 2020-12-27 DIAGNOSIS — J449 Chronic obstructive pulmonary disease, unspecified: Secondary | ICD-10-CM

## 2020-12-27 DIAGNOSIS — R059 Cough, unspecified: Secondary | ICD-10-CM | POA: Diagnosis not present

## 2020-12-27 DIAGNOSIS — R7303 Prediabetes: Secondary | ICD-10-CM | POA: Diagnosis not present

## 2020-12-27 DIAGNOSIS — N400 Enlarged prostate without lower urinary tract symptoms: Secondary | ICD-10-CM | POA: Diagnosis not present

## 2020-12-27 NOTE — Assessment & Plan Note (Signed)
-  A1c added on today -last glucose was slightly elevated at 102

## 2020-12-27 NOTE — Assessment & Plan Note (Signed)
-  worse at night -he had recent surgery and thinks he has some PND -had recent CXR that showed improvement

## 2020-12-27 NOTE — Patient Instructions (Signed)
Please have fasting labs drawn 2-3 days prior to your appointment so we can discuss the results during your office visit.  

## 2020-12-27 NOTE — Assessment & Plan Note (Signed)
-  BPH improved after stopping trelegy -he will likely cancel his urology appt
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
## 2020-12-27 NOTE — Assessment & Plan Note (Signed)
-  followed by pulmonology -recently stopped his trelegy and his BPH improved

## 2020-12-29 LAB — HGB A1C W/O EAG: Hgb A1c MFr Bld: 6.3 % — ABNORMAL HIGH (ref 4.8–5.6)

## 2020-12-29 LAB — SPECIMEN STATUS REPORT

## 2021-01-04 ENCOUNTER — Ambulatory Visit: Payer: Medicare Other | Admitting: Urology

## 2021-01-19 ENCOUNTER — Other Ambulatory Visit: Payer: Self-pay | Admitting: Family Medicine

## 2021-01-22 ENCOUNTER — Other Ambulatory Visit: Payer: Self-pay

## 2021-01-22 ENCOUNTER — Ambulatory Visit (INDEPENDENT_AMBULATORY_CARE_PROVIDER_SITE_OTHER): Payer: Medicare Other | Admitting: Internal Medicine

## 2021-01-22 ENCOUNTER — Other Ambulatory Visit: Payer: Self-pay | Admitting: Family Medicine

## 2021-01-22 ENCOUNTER — Encounter: Payer: Self-pay | Admitting: Internal Medicine

## 2021-01-22 VITALS — BP 112/70 | HR 78 | Ht 70.0 in | Wt 189.1 lb

## 2021-01-22 DIAGNOSIS — M791 Myalgia, unspecified site: Secondary | ICD-10-CM | POA: Diagnosis not present

## 2021-01-22 DIAGNOSIS — T466X5D Adverse effect of antihyperlipidemic and antiarteriosclerotic drugs, subsequent encounter: Secondary | ICD-10-CM | POA: Diagnosis not present

## 2021-01-22 DIAGNOSIS — I2510 Atherosclerotic heart disease of native coronary artery without angina pectoris: Secondary | ICD-10-CM | POA: Diagnosis not present

## 2021-01-22 DIAGNOSIS — E785 Hyperlipidemia, unspecified: Secondary | ICD-10-CM | POA: Diagnosis not present

## 2021-01-22 DIAGNOSIS — T466X5A Adverse effect of antihyperlipidemic and antiarteriosclerotic drugs, initial encounter: Secondary | ICD-10-CM
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
## 2021-01-22 NOTE — Patient Instructions (Signed)
Medication Instructions:  Your physician recommends that you continue on your current medications as directed. Please refer to the Current Medication list given to you today.  *If you need a refill on your cardiac medications before your next appointment, please call your pharmacy*   Lab Work: FASTING lab work in 1 year to check cholesterol   If you have labs (blood work) drawn today and your tests are completely normal, you will receive your results only by: . MyChart Message (if you have MyChart) OR . A paper copy in the mail If you have any lab test that is abnormal or we need to change your treatment, we will call you to review the results.   Testing/Procedures: NONE   Follow-Up: At [facility], you and your health needs are our priority.  As part of our continuing mission to provide you with exceptional heart care, we have created designated Provider Care Teams.  These Care Teams include your primary Cardiologist (physician) and Advanced Practice Providers (APPs -  Physician Assistants and Nurse Practitioners) who all work together to provide you with the care you need, when you need it.  We recommend signing up for the patient portal called "MyChart".  Sign up information is provided on this After Visit Summary.  MyChart is used to connect with patients for Virtual Visits (Telemedicine).  Patients are able to view lab/test results, encounter notes, upcoming appointments, etc.  Non-urgent messages can be sent to your provider as well.   To learn more about what you can do with MyChart, go to [email or web address].    Your next appointment:   12 month(s) - lipid clinic  The format for your next appointment:   In Person  Provider:   K. Chad Hilty, MD   Other Instructions   

## 2021-01-24 ENCOUNTER — Other Ambulatory Visit: Payer: Self-pay | Admitting: Family Medicine

## 2021-01-25 ENCOUNTER — Other Ambulatory Visit: Payer: Self-pay

## 2021-01-25 DIAGNOSIS — N400 Enlarged prostate without lower urinary tract symptoms: Secondary | ICD-10-CM

## 2021-01-25 MED ORDER — TAMSULOSIN HCL 0.4 MG PO CAPS: ORAL_CAPSULE | ORAL | 2 refills | Status: DC

## 2021-01-30 DIAGNOSIS — Z86010 Personal history of colonic polyps: Secondary | ICD-10-CM | POA: Diagnosis not present

## 2021-01-30 DIAGNOSIS — K219 Gastro-esophageal reflux disease without esophagitis: Secondary | ICD-10-CM | POA: Diagnosis not present

## 2021-01-30 DIAGNOSIS — K580 Irritable bowel syndrome with diarrhea: Secondary | ICD-10-CM | POA: Diagnosis not present

## 2021-01-30 DIAGNOSIS — Z8719 Personal history of other diseases of the digestive system: Secondary | ICD-10-CM | POA: Diagnosis not present

## 2021-02-05 ENCOUNTER — Other Ambulatory Visit: Payer: Self-pay

## 2021-02-05 ENCOUNTER — Other Ambulatory Visit (HOSPITAL_COMMUNITY)
Admission: RE | Admit: 2021-02-05 | Discharge: 2021-02-05 | Disposition: A | Discharge status: Home / Self Care | Payer: Medicare Other | Source: Ambulatory Visit | Attending: Internal Medicine | Admitting: Internal Medicine

## 2021-02-05 DIAGNOSIS — Z20822 Contact with and (suspected) exposure to covid-19: Secondary | ICD-10-CM | POA: Diagnosis not present

## 2021-02-05 DIAGNOSIS — Z01812 Encounter for preprocedural laboratory examination: Secondary | ICD-10-CM | POA: Insufficient documentation

## 2021-02-06 LAB — SARS CORONAVIRUS 2 (TAT 6-24 HRS): SARS Coronavirus 2: NEGATIVE

## 2021-02-08 ENCOUNTER — Ambulatory Visit (HOSPITAL_COMMUNITY)
Admission: RE | Admit: 2021-02-08 | Discharge: 2021-02-08 | Disposition: A | Discharge status: Home / Self Care | Payer: Medicare Other | Source: Ambulatory Visit | Attending: Internal Medicine | Admitting: Internal Medicine

## 2021-02-08 DIAGNOSIS — J449 Chronic obstructive pulmonary disease, unspecified: Secondary | ICD-10-CM | POA: Diagnosis not present

## 2021-02-08 LAB — PULMONARY FUNCTION TEST
DL/VA % pred: 44 %
DL/VA: 1.76 ml/min/mmHg/L
DLCO unc % pred: 33 %
DLCO unc: 8.17 ml/min/mmHg
FEF 25-75 Post: 1.21 L/sec
FEF 25-75 Pre: 1.2 L/sec
FEF2575-%Change-Post: 1 %
FEF2575-%Pred-Post: 58 %
FEF2575-%Pred-Pre: 57 %
FEV1-%Change-Post: -2 %
FEV1-%Pred-Post: 73 %
FEV1-%Pred-Pre: 75 %
FEV1-Post: 2.17 L
FEV1-Pre: 2.22 L
FEV1FVC-%Change-Post: -8 %
FEV1FVC-%Pred-Pre: 86 %
FEV6-%Change-Post: 4 %
FEV6-%Pred-Post: 97 %
FEV6-%Pred-Pre: 93 %
FEV6-Post: 3.76 L
FEV6-Pre: 3.58 L
FEV6FVC-%Change-Post: -1 %
FEV6FVC-%Pred-Post: 105 %
FEV6FVC-%Pred-Pre: 106 %
FVC-%Change-Post: 6 %
FVC-%Pred-Post: 92 %
FVC-%Pred-Pre: 87 %
FVC-Post: 3.8 L
FVC-Pre: 3.58 L
Post FEV1/FVC ratio: 57 %
Post FEV6/FVC ratio: 99 %
Pre FEV1/FVC ratio: 62 %
Pre FEV6/FVC Ratio: 100 %
RV % pred: 64 %
RV: 1.7 L
TLC % pred: 76 %
TLC: 5.37 L

## 2021-02-08 MED ORDER — ALBUTEROL SULFATE (2.5 MG/3ML) 0.083% IN NEBU
2.5000 mg | INHALATION_SOLUTION | Freq: Once | RESPIRATORY_TRACT | Status: AC
  Administered 2021-02-08: 2.5 mg via RESPIRATORY_TRACT

## 2021-02-09 NOTE — Progress Notes (Signed)
Error in my previous entry  I called the pt and there was no answer so LMTCB

## 2021-02-09 NOTE — Progress Notes (Signed)
Spoke with pt and notified of results per Dr. Wert. Pt verbalized understanding and denied any questions. 

## 2021-03-14 DIAGNOSIS — H04222 Epiphora due to insufficient drainage, left lacrimal gland: Secondary | ICD-10-CM | POA: Diagnosis not present

## 2021-03-14 DIAGNOSIS — H04221 Epiphora due to insufficient drainage, right lacrimal gland: Secondary | ICD-10-CM | POA: Diagnosis not present

## 2021-03-14 DIAGNOSIS — Z09 Encounter for follow-up examination after completed treatment for conditions other than malignant neoplasm: Secondary | ICD-10-CM | POA: Diagnosis not present

## 2021-03-15 ENCOUNTER — Other Ambulatory Visit: Payer: Self-pay

## 2021-03-15 ENCOUNTER — Encounter: Payer: Self-pay | Admitting: Cardiology

## 2021-03-15 ENCOUNTER — Ambulatory Visit (INDEPENDENT_AMBULATORY_CARE_PROVIDER_SITE_OTHER): Payer: Medicare Other | Admitting: Cardiology

## 2021-03-15 VITALS — BP 102/58 | HR 70 | Ht 70.0 in | Wt 194.0 lb

## 2021-03-15 DIAGNOSIS — T466X5D Adverse effect of antihyperlipidemic and antiarteriosclerotic drugs, subsequent encounter: Secondary | ICD-10-CM

## 2021-03-15 DIAGNOSIS — I25119 Atherosclerotic heart disease of native coronary artery with unspecified angina pectoris: Secondary | ICD-10-CM

## 2021-03-15 DIAGNOSIS — E782 Mixed hyperlipidemia: Secondary | ICD-10-CM

## 2021-03-15 DIAGNOSIS — T466X5A Adverse effect of antihyperlipidemic and antiarteriosclerotic drugs, initial encounter: Secondary | ICD-10-CM

## 2021-03-15 DIAGNOSIS — M791 Myalgia, unspecified site: Secondary | ICD-10-CM
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
## 2021-03-15 NOTE — Patient Instructions (Addendum)

## 2021-04-04 ENCOUNTER — Ambulatory Visit: Payer: Medicare Other | Admitting: Nurse Practitioner

## 2021-04-13 ENCOUNTER — Encounter: Payer: Self-pay | Admitting: Nurse Practitioner

## 2021-04-13 ENCOUNTER — Ambulatory Visit (INDEPENDENT_AMBULATORY_CARE_PROVIDER_SITE_OTHER): Payer: Medicare Other | Admitting: Nurse Practitioner

## 2021-04-13 ENCOUNTER — Other Ambulatory Visit: Payer: Self-pay

## 2021-04-13 DIAGNOSIS — K429 Umbilical hernia without obstruction or gangrene: Secondary | ICD-10-CM | POA: Diagnosis not present

## 2021-04-13 DIAGNOSIS — N5082 Scrotal pain: Secondary | ICD-10-CM

## 2021-04-13 DIAGNOSIS — R0600 Dyspnea, unspecified: Secondary | ICD-10-CM | POA: Diagnosis not present

## 2021-04-13 DIAGNOSIS — R0609 Other forms of dyspnea: Secondary | ICD-10-CM

## 2021-04-13 NOTE — Assessment & Plan Note (Addendum)
-  followed by pulmonology and cardiology -at baseline today
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
## 2021-04-13 NOTE — Assessment & Plan Note (Signed)
-  tender to touch -U/S ordered to r/o hernia -referral to surgery

## 2021-04-13 NOTE — Assessment & Plan Note (Signed)
-  started 2 months ago, but pain is getting gradually worse -referral to surgery

## 2021-04-23 ENCOUNTER — Other Ambulatory Visit: Payer: Self-pay

## 2021-04-23 ENCOUNTER — Ambulatory Visit (HOSPITAL_COMMUNITY)
Admission: RE | Admit: 2021-04-23 | Discharge: 2021-04-23 | Disposition: A | Discharge status: Home / Self Care | Payer: Medicare Other | Source: Ambulatory Visit | Attending: Nurse Practitioner | Admitting: Nurse Practitioner

## 2021-04-23 DIAGNOSIS — N5082 Scrotal pain: Secondary | ICD-10-CM | POA: Diagnosis not present

## 2021-04-23 DIAGNOSIS — K429 Umbilical hernia without obstruction or gangrene: Secondary | ICD-10-CM | POA: Diagnosis not present

## 2021-04-23 DIAGNOSIS — I861 Scrotal varices: Secondary | ICD-10-CM | POA: Diagnosis not present

## 2021-04-24 NOTE — Progress Notes (Signed)
Ultrasound found a small fat-containing umbilical hernia. Since he has been having pain, make sure to f/u with surgery.

## 2021-05-03 ENCOUNTER — Encounter: Payer: Self-pay | Admitting: General Surgery

## 2021-05-03 ENCOUNTER — Other Ambulatory Visit: Payer: Self-pay

## 2021-05-03 ENCOUNTER — Ambulatory Visit (INDEPENDENT_AMBULATORY_CARE_PROVIDER_SITE_OTHER): Payer: Medicare Other | Admitting: General Surgery

## 2021-05-03 VITALS — BP 116/52 | HR 55 | Temp 98.9°F | Resp 16 | Ht 71.0 in | Wt 191.0 lb

## 2021-05-03 DIAGNOSIS — I25119 Atherosclerotic heart disease of native coronary artery with unspecified angina pectoris: Secondary | ICD-10-CM | POA: Diagnosis not present

## 2021-05-03 DIAGNOSIS — K429 Umbilical hernia without obstruction or gangrene: Secondary | ICD-10-CM

## 2021-05-03 DIAGNOSIS — K4090 Unilateral inguinal hernia, without obstruction or gangrene, not specified as recurrent: Secondary | ICD-10-CM
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
## 2021-05-03 NOTE — Patient Instructions (Signed)
Benjamin [name]  05/03/2021     '@PREFPERIOPPHARMACY'$ @   Your procedure is scheduled on  05/07/2021.   Report to [facility] at  [phone] A.M.   Call this number if you have problems the morning of surgery:  [phone]   Remember:  Do not eat or drink after midnight.      Take these medicines the morning of surgery with A SIP OF WATER                                 metoprolol, protonix.     Do not wear jewelry, make-up or nail polish.  Do not wear lotions, powders, or perfumes, or deodorant.  Do not shave 48 hours prior to surgery.  Men may shave face and neck.  Do not bring valuables to the hospital.  [facility] is not responsible for any belongings or valuables.  Contacts, dentures or bridgework may not be worn into surgery.  Leave your suitcase in the car.  After surgery it may be brought to your room.  For patients admitted to the hospital, discharge time will be determined by your treatment team.  Patients discharged the day of surgery will not be allowed to drive home and must have someone with them for 24 hours.    Special instructions:   DO NOT smoke tobacco or vape for 24 hours before your procedure.  Please read over the following fact sheets that you were given. Coughing and Deep Breathing, Surgical Site Infection Prevention, Anesthesia Post-op Instructions, and Care and Recovery After Surgery      Open Hernia Repair, Adult, Care After What can I expect after the procedure? After the procedure, it is common to have: Mild discomfort. Slight bruising. Mild swelling. Pain in the belly (abdomen). A small amount of blood from the cut from surgery (incision). Follow these instructions at home: Your doctor may give you more specific instructions. If you have problems, callyour doctor. Medicines Take over-the-counter and prescription medicines only as told by your doctor. If told, take steps to prevent problems with pooping (constipation). You may need  to: Drink enough fluid to keep your pee (urine) pale yellow. Take medicines. You will be told what medicines to take. Eat foods that are high in fiber. These include beans, whole grains, and fresh fruits and vegetables. Limit foods that are high in fat and sugar. These include fried or sweet foods. Ask your doctor if you should avoid driving or using machines while you are taking your medicine. Incision care  Follow instructions from your doctor about how to take care of your incision. Make sure you: Wash your hands with soap and water for at least 20 seconds before and after you change your bandage (dressing). If you cannot use soap and water, use hand sanitizer. Change your bandage. Leave stitches or skin glue in place for at least 2 weeks. Leave tape strips alone unless you are told to take them off. You may trim the edges of the tape strips if they curl up. Check your incision every day for signs of infection. Check for: More redness, swelling, or pain. More fluid or blood. Warmth. Pus or a bad smell. Wear loose, soft clothing while your incision heals.  Activity  Rest as told by your doctor. Do not lift anything that is heavier than 10 lb (4.5 kg), or the limit that you are told. Do  not play contact sports until your doctor says that this is safe. If you were given a sedative during your procedure, do not drive or use machines until your doctor says that it is safe. A sedative is a medicine that helps you relax. Return to your normal activities when your doctor says that it is safe.  General instructions Do not take baths, swim, or use a hot tub. Ask your doctor about taking showers or sponge baths. Hold a pillow over your belly when you cough or sneeze. This helps with pain. Do not smoke or use any products that contain nicotine or tobacco. If you need help quitting, ask your doctor. Keep all follow-up visits. Contact a doctor if: You have any of these signs of infection in or  around your incision: More redness, swelling, or pain. More fluid or blood. Warmth. Pus. A bad smell. You have a fever or chills. You have blood in your poop (stool). You have not pooped (had a bowel movement) in 2-3 days. Medicine does not help your pain. Get help right away if: You have chest pain, or you are short of breath. You feel faint or light-headed. You have very bad pain. You vomit and your pain is worse. You have pain, swelling, or redness in a leg. These symptoms may be an emergency. Get help right away. Call your local emergency services (911 in the [place] Do not wait to see if the symptoms will go away. Do not drive yourself to the hospital. Summary After this procedure, it is common to have mild discomfort, slight bruising, and mild swelling. Follow instructions from your doctor about how to take care of your cut from surgery (incision). Check every day for signs of infection. Do not lift heavy objects or play contact sports until your doctor says it is safe. Return to your normal activities as told by your doctor. This information is not intended to replace advice given to you by your health care provider. Make sure you discuss any questions you have with your healthcare provider. Document Revised: 04/17/2020 Document Reviewed: 04/17/2020 Elsevier Patient Education  2022 [place] Anesthesia, Adult, Care After This sheet gives you information about how to care for yourself after your procedure. Your health care provider may also give you more specific instructions. If you have problems or questions, contact your health careprovider. What can I expect after the procedure? After the procedure, the following side effects are common: Pain or discomfort at the IV site. Nausea. Vomiting. Sore throat. Trouble concentrating. Feeling cold or chills. Feeling weak or tired. Sleepiness and fatigue. Soreness and body aches. These side effects can affect parts  of the body that were not involved in surgery. Follow these instructions at home: For the time period you were told by your health care provider:  Rest. Do not participate in activities where you could fall or become injured. Do not drive or use machinery. Do not drink alcohol. Do not take sleeping pills or medicines that cause drowsiness. Do not make important decisions or sign legal documents. Do not take care of children on your own.  Eating and drinking Follow any instructions from your health care provider about eating or drinking restrictions. When you feel hungry, start by eating small amounts of foods that are soft and easy to digest (bland), such as toast. Gradually return to your regular diet. Drink enough fluid to keep your urine pale yellow. If you vomit, rehydrate by drinking water, juice, or clear broth. General instructions  If you have sleep apnea, surgery and certain medicines can increase your risk for breathing problems. Follow instructions from your health care provider about wearing your sleep device: Anytime you are sleeping, including during daytime naps. While taking prescription pain medicines, sleeping medicines, or medicines that make you drowsy. Have a responsible adult stay with you for the time you are told. It is important to have someone help care for you until you are awake and alert. Return to your normal activities as told by your health care provider. Ask your health care provider what activities are safe for you. Take over-the-counter and prescription medicines only as told by your health care provider. If you smoke, do not smoke without supervision. Keep all follow-up visits as told by your health care provider. This is important. Contact a health care provider if: You have nausea or vomiting that does not get better with medicine. You cannot eat or drink without vomiting. You have pain that does not get better with medicine. You are unable to pass  urine. You develop a skin rash. You have a fever. You have redness around your IV site that gets worse. Get help right away if: You have difficulty breathing. You have chest pain. You have blood in your urine or stool, or you vomit blood. Summary After the procedure, it is common to have a sore throat or nausea. It is also common to feel tired. Have a responsible adult stay with you for the time you are told. It is important to have someone help care for you until you are awake and alert. When you feel hungry, start by eating small amounts of foods that are soft and easy to digest (bland), such as toast. Gradually return to your regular diet. Drink enough fluid to keep your urine pale yellow. Return to your normal activities as told by your health care provider. Ask your health care provider what activities are safe for you. This information is not intended to replace advice given to you by your health care provider. Make sure you discuss any questions you have with your healthcare provider. Document Revised: 05/18/2020 Document Reviewed: 12/16/2019 Elsevier Patient Education  2022 [place]. How to Use Chlorhexidine for Bathing Chlorhexidine gluconate (CHG) is a germ-killing (antiseptic) solution that is used to clean the skin. It can get rid of the bacteria that normally live on the skin and can keep them away for about 24 hours. To clean your skin with CHG, you may be given: A CHG solution to use in the shower or as part of a sponge bath. A prepackaged cloth that contains CHG. Cleaning your skin with CHG may help lower the risk for infection: While you are staying in the intensive care unit of the hospital. If you have a vascular access, such as a central line, to provide short-term or long-term access to your veins. If you have a catheter to drain urine from your bladder. If you are on a ventilator. A ventilator is a machine that helps you breathe by moving air in and out of your  lungs. After surgery. What are the risks? Risks of using CHG include: A skin reaction. Hearing loss, if CHG gets in your ears. Eye injury, if CHG gets in your eyes and is not rinsed out. The CHG product catching fire. Make sure that you avoid smoking and flames after applying CHG to your skin. Do not use CHG: If you have a chlorhexidine allergy or have previously reacted to chlorhexidine. On babies younger  than 78 months of age. How to use CHG solution Use CHG only as told by your health care provider, and follow the instructions on the label. Use the full amount of CHG as directed. Usually, this is one bottle. During a shower Follow these steps when using CHG solution during a shower (unless your health care provider gives you different instructions): Start the shower. Use your normal soap and shampoo to wash your face and hair. Turn off the shower or move out of the shower stream. Pour the CHG onto a clean washcloth. Do not use any type of brush or rough-edged sponge. Starting at your neck, lather your body down to your toes. Make sure you follow these instructions: If you will be having surgery, pay special attention to the part of your body where you will be having surgery. Scrub this area for at least 1 minute. Do not use CHG on your head or face. If the solution gets into your ears or eyes, rinse them well with water. Avoid your genital area. Avoid any areas of skin that have broken skin, cuts, or scrapes. Scrub your back and under your arms. Make sure to wash skin folds. Let the lather sit on your skin for 1-2 minutes or as long as told by your health care provider. Thoroughly rinse your entire body in the shower. Make sure that all body creases and crevices are rinsed well. Dry off with a clean towel. Do not put any substances on your body afterward--such as powder, lotion, or perfume--unless you are told to do so by your health care provider. Only use lotions that are recommended  by the manufacturer. Put on clean clothes or pajamas. If it is the night before your surgery, sleep in clean sheets.  During a sponge bath Follow these steps when using CHG solution during a sponge bath (unless your health care provider gives you different instructions): Use your normal soap and shampoo to wash your face and hair. Pour the CHG onto a clean washcloth. Starting at your neck, lather your body down to your toes. Make sure you follow these instructions: If you will be having surgery, pay special attention to the part of your body where you will be having surgery. Scrub this area for at least 1 minute. Do not use CHG on your head or face. If the solution gets into your ears or eyes, rinse them well with water. Avoid your genital area. Avoid any areas of skin that have broken skin, cuts, or scrapes. Scrub your back and under your arms. Make sure to wash skin folds. Let the lather sit on your skin for 1-2 minutes or as long as told by your health care provider. Using a different clean, wet washcloth, thoroughly rinse your entire body. Make sure that all body creases and crevices are rinsed well. Dry off with a clean towel. Do not put any substances on your body afterward--such as powder, lotion, or perfume--unless you are told to do so by your health care provider. Only use lotions that are recommended by the manufacturer. Put on clean clothes or pajamas. If it is the night before your surgery, sleep in clean sheets. How to use CHG prepackaged cloths Only use CHG cloths as told by your health care provider, and follow the instructions on the label. Use the CHG cloth on clean, dry skin. Do not use the CHG cloth on your head or face unless your health care provider tells you to. When washing with the CHG cloth:  Avoid your genital area. Avoid any areas of skin that have broken skin, cuts, or scrapes. Before surgery Follow these steps when using a CHG cloth to clean before surgery  (unless your health care provider gives you different instructions): Using the CHG cloth, vigorously scrub the part of your body where you will be having surgery. Scrub using a back-and-forth motion for 3 minutes. The area on your body should be completely wet with CHG when you are done scrubbing. Do not rinse. Discard the cloth and let the area air-dry. Do not put any substances on the area afterward, such as powder, lotion, or perfume. Put on clean clothes or pajamas. If it is the night before your surgery, sleep in clean sheets.  For general bathing Follow these steps when using CHG cloths for general bathing (unless your health care provider gives you different instructions). Use a separate CHG cloth for each area of your body. Make sure you wash between any folds of skin and between your fingers and toes. Wash your body in the following order, switching to a new cloth after each step: The front of your neck, shoulders, and chest. Both of your arms, under your arms, and your hands. Your stomach and groin area, avoiding the genitals. Your right leg and foot. Your left leg and foot. The back of your neck, your back, and your buttocks. Do not rinse. Discard the cloth and let the area air-dry. Do not put any substances on your body afterward--such as powder, lotion, or perfume--unless you are told to do so by your health care provider. Only use lotions that are recommended by the manufacturer. Put on clean clothes or pajamas. Contact a health care provider if: Your skin gets irritated after scrubbing. You have questions about using your solution or cloth. Get help right away if: Your eyes become very red or swollen. Your eyes itch badly. Your skin itches badly and is red or swollen. Your hearing changes. You have trouble seeing. You have swelling or tingling in your mouth or throat. You have trouble breathing. You swallow any chlorhexidine. Summary Chlorhexidine gluconate (CHG) is a  germ-killing (antiseptic) solution that is used to clean the skin. Cleaning your skin with CHG may help to lower your risk for infection. You may be given CHG to use for bathing. It may be in a bottle or in a prepackaged cloth to use on your skin. Carefully follow your health care provider's instructions and the instructions on the product label. Do not use CHG if you have a chlorhexidine allergy. Contact your health care provider if your skin gets irritated after scrubbing. This information is not intended to replace advice given to you by your health care provider. Make sure you discuss any questions you have with your healthcare provider. Document Revised: 01/14/2020 Document Reviewed: 02/18/2020 Elsevier Patient Education  [place].
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
## 2021-05-04 ENCOUNTER — Encounter (HOSPITAL_COMMUNITY)
Admission: RE | Admit: 2021-05-04 | Discharge: 2021-05-04 | Disposition: A | Discharge status: Home / Self Care | Payer: Medicare Other | Source: Ambulatory Visit | Attending: General Surgery | Admitting: General Surgery

## 2021-05-07 ENCOUNTER — Encounter (HOSPITAL_COMMUNITY): Payer: Self-pay | Admitting: General Surgery

## 2021-05-07 ENCOUNTER — Ambulatory Visit (HOSPITAL_COMMUNITY): Payer: Medicare Other | Admitting: Anesthesiology

## 2021-05-07 ENCOUNTER — Encounter (HOSPITAL_COMMUNITY)
Admission: RE | Disposition: A | Discharge status: Home / Self Care | Payer: Self-pay | Source: Home / Self Care | Attending: General Surgery

## 2021-05-07 ENCOUNTER — Ambulatory Visit (HOSPITAL_COMMUNITY)
Admission: RE | Admit: 2021-05-07 | Discharge: 2021-05-07 | Disposition: A | Discharge status: Home / Self Care | Payer: Medicare Other | Attending: General Surgery | Admitting: General Surgery

## 2021-05-07 DIAGNOSIS — K429 Umbilical hernia without obstruction or gangrene: Secondary | ICD-10-CM

## 2021-05-07 DIAGNOSIS — Z87891 Personal history of nicotine dependence: Secondary | ICD-10-CM | POA: Insufficient documentation

## 2021-05-07 DIAGNOSIS — Z91048 Other nonmedicinal substance allergy status: Secondary | ICD-10-CM | POA: Diagnosis not present

## 2021-05-07 DIAGNOSIS — Z91041 Radiographic dye allergy status: Secondary | ICD-10-CM | POA: Insufficient documentation

## 2021-05-07 DIAGNOSIS — J449 Chronic obstructive pulmonary disease, unspecified: Secondary | ICD-10-CM | POA: Diagnosis not present

## 2021-05-07 DIAGNOSIS — N50811 Right testicular pain: Secondary | ICD-10-CM | POA: Insufficient documentation

## 2021-05-07 DIAGNOSIS — K4090 Unilateral inguinal hernia, without obstruction or gangrene, not specified as recurrent: Secondary | ICD-10-CM | POA: Diagnosis not present

## 2021-05-07 DIAGNOSIS — Z951 Presence of aortocoronary bypass graft: Secondary | ICD-10-CM | POA: Insufficient documentation

## 2021-05-07 DIAGNOSIS — Z79899 Other long term (current) drug therapy: Secondary | ICD-10-CM | POA: Insufficient documentation

## 2021-05-07 DIAGNOSIS — Z888 Allergy status to other drugs, medicaments and biological substances status: Secondary | ICD-10-CM | POA: Diagnosis not present

## 2021-05-07 DIAGNOSIS — Z85828 Personal history of other malignant neoplasm of skin: Secondary | ICD-10-CM | POA: Diagnosis not present

## 2021-05-07 HISTORY — PX: UMBILICAL HERNIA REPAIR: SHX196

## 2021-05-07 HISTORY — PX: INGUINAL HERNIA REPAIR: SHX194

## 2021-05-07 SURGERY — REPAIR, HERNIA, UMBILICAL, ADULT
Anesthesia: General | Site: Inguinal | Laterality: Right

## 2021-05-07 MED ORDER — SUGAMMADEX SODIUM 200 MG/2ML IV SOLN
INTRAVENOUS | Status: DC | PRN
  Administered 2021-05-07: 200 mg via INTRAVENOUS

## 2021-05-07 MED ORDER — HYDROCODONE-ACETAMINOPHEN 5-325 MG PO TABS: 1.0000 | ORAL_TABLET | Freq: Four times a day (QID) | ORAL | 0 refills | Status: DC | PRN

## 2021-05-07 MED ORDER — CEFAZOLIN SODIUM-DEXTROSE 2-4 GM/100ML-% IV SOLN
INTRAVENOUS | Status: AC
  Filled 2021-05-07: qty 100

## 2021-05-07 MED ORDER — DEXAMETHASONE SODIUM PHOSPHATE 10 MG/ML IJ SOLN
INTRAMUSCULAR | Status: DC | PRN
  Administered 2021-05-07: 10 mg via INTRAVENOUS

## 2021-05-07 MED ORDER — ONDANSETRON HCL 4 MG/2ML IJ SOLN
INTRAMUSCULAR | Status: DC | PRN
  Administered 2021-05-07: 4 mg via INTRAVENOUS

## 2021-05-07 MED ORDER — BUPIVACAINE HCL (300 MG DOSE) 3 X 100 MG IL IMPL
DRUG_IMPLANT | Status: DC | PRN
  Administered 2021-05-07: 300 mg

## 2021-05-07 MED ORDER — PROPOFOL 10 MG/ML IV BOLUS
INTRAVENOUS | Status: AC
  Filled 2021-05-07: qty 20

## 2021-05-07 MED ORDER — ROCURONIUM BROMIDE 100 MG/10ML IV SOLN
INTRAVENOUS | Status: DC | PRN
  Administered 2021-05-07: 50 mg via INTRAVENOUS
  Administered 2021-05-07: 10 mg via INTRAVENOUS

## 2021-05-07 MED ORDER — CEFAZOLIN SODIUM-DEXTROSE 2-4 GM/100ML-% IV SOLN
2.0000 g | INTRAVENOUS | Status: AC
  Administered 2021-05-07: 2 g via INTRAVENOUS

## 2021-05-07 MED ORDER — CHLORHEXIDINE GLUCONATE CLOTH 2 % EX PADS: 6.0000 | MEDICATED_PAD | Freq: Once | CUTANEOUS | Status: DC

## 2021-05-07 MED ORDER — PHENYLEPHRINE HCL (PRESSORS) 10 MG/ML IV SOLN
INTRAVENOUS | Status: DC | PRN
  Administered 2021-05-07: 80 ug via INTRAVENOUS
  Administered 2021-05-07: 120 ug via INTRAVENOUS

## 2021-05-07 MED ORDER — FENTANYL CITRATE PF 50 MCG/ML IJ SOSY
25.0000 ug | PREFILLED_SYRINGE | INTRAMUSCULAR | Status: DC | PRN
  Administered 2021-05-07: 50 ug via INTRAVENOUS

## 2021-05-07 MED ORDER — FENTANYL CITRATE PF 50 MCG/ML IJ SOSY
PREFILLED_SYRINGE | INTRAMUSCULAR | Status: AC
  Filled 2021-05-07: qty 1

## 2021-05-07 MED ORDER — SODIUM CHLORIDE 0.9 % IR SOLN
Status: DC | PRN
  Administered 2021-05-07: 1000 mL

## 2021-05-07 MED ORDER — FENTANYL CITRATE (PF) 100 MCG/2ML IJ SOLN
INTRAMUSCULAR | Status: DC | PRN
  Administered 2021-05-07: 50 ug via INTRAVENOUS
  Administered 2021-05-07: 100 ug via INTRAVENOUS
  Administered 2021-05-07: 50 ug via INTRAVENOUS

## 2021-05-07 MED ORDER — FENTANYL CITRATE (PF) 250 MCG/5ML IJ SOLN
INTRAMUSCULAR | Status: AC
  Filled 2021-05-07: qty 5

## 2021-05-07 MED ORDER — ORAL CARE MOUTH RINSE: 15.0000 mL | Freq: Once | OROMUCOSAL | Status: AC

## 2021-05-07 MED ORDER — CHLORHEXIDINE GLUCONATE 0.12 % MT SOLN
15.0000 mL | Freq: Once | OROMUCOSAL | Status: AC
  Administered 2021-05-07: 15 mL via OROMUCOSAL

## 2021-05-07 MED ORDER — ONDANSETRON HCL 4 MG/2ML IJ SOLN: 4.0000 mg | Freq: Once | INTRAMUSCULAR | Status: DC | PRN

## 2021-05-07 MED ORDER — LACTATED RINGERS IV SOLN
INTRAVENOUS | Status: DC
  Administered 2021-05-07: 1000 mL via INTRAVENOUS

## 2021-05-07 MED ORDER — LIDOCAINE HCL (CARDIAC) PF 100 MG/5ML IV SOSY
PREFILLED_SYRINGE | INTRAVENOUS | Status: DC | PRN
  Administered 2021-05-07: 80 mg via INTRAVENOUS

## 2021-05-07 MED ORDER — PROPOFOL 10 MG/ML IV BOLUS
INTRAVENOUS | Status: DC | PRN
  Administered 2021-05-07: 150 mg via INTRAVENOUS

## 2021-05-07 MED ORDER — KETOROLAC TROMETHAMINE 30 MG/ML IJ SOLN
15.0000 mg | Freq: Once | INTRAMUSCULAR | Status: AC
  Administered 2021-05-07: 15 mg via INTRAVENOUS
  Filled 2021-05-07: qty 1

## 2021-05-07 MED ORDER — CHLORHEXIDINE GLUCONATE 0.12 % MT SOLN
OROMUCOSAL | Status: AC
  Filled 2021-05-07: qty 15

## 2021-05-07 MED ORDER — HYDROMORPHONE HCL 1 MG/ML IJ SOLN
0.2500 mg | INTRAMUSCULAR | Status: DC | PRN
  Administered 2021-05-07: 0.5 mg via INTRAVENOUS
  Filled 2021-05-07: qty 0.5

## 2021-05-07 SURGICAL SUPPLY — 37 items
ADH SKN CLS APL DERMABOND .7 (GAUZE/BANDAGES/DRESSINGS) ×4
BLADE SURG SZ11 CARB STEEL (BLADE) ×3 IMPLANT
CLOTH BEACON ORANGE TIMEOUT ST (SAFETY) ×3 IMPLANT
COVER LIGHT HANDLE STERIS (MISCELLANEOUS) ×6 IMPLANT
DERMABOND ADVANCED (GAUZE/BANDAGES/DRESSINGS) ×2
DERMABOND ADVANCED .7 DNX12 (GAUZE/BANDAGES/DRESSINGS) ×4 IMPLANT
DRAIN PENROSE 0.5X18 (DRAIN) ×3 IMPLANT
ELECT REM PT RETURN 9FT ADLT (ELECTROSURGICAL) ×3
ELECTRODE REM PT RTRN 9FT ADLT (ELECTROSURGICAL) ×2 IMPLANT
GAUZE 4X4 16PLY ~~LOC~~+RFID DBL (SPONGE) ×3 IMPLANT
GAUZE SPONGE 4X4 12PLY STRL (GAUZE/BANDAGES/DRESSINGS) ×3 IMPLANT
GLOVE SRG 8 PF TXTR STRL LF DI (GLOVE) ×2 IMPLANT
GLOVE SURG NEOPR MICRO LF SZ8 (GLOVE) ×3 IMPLANT
GLOVE SURG POLYISO LF SZ7.5 (GLOVE) ×3 IMPLANT
GLOVE SURG UNDER POLY LF SZ7 (GLOVE) ×9 IMPLANT
GLOVE SURG UNDER POLY LF SZ8 (GLOVE) ×3
GOWN STRL REUS W/TWL LRG LVL3 (GOWN DISPOSABLE) ×9 IMPLANT
GOWN STRL REUS W/TWL XL LVL3 (GOWN DISPOSABLE) ×3 IMPLANT
INST SET MINOR GENERAL (KITS) ×3 IMPLANT
KIT TURNOVER KIT A (KITS) ×3 IMPLANT
MANIFOLD NEPTUNE II (INSTRUMENTS) ×3 IMPLANT
MESH HERNIA 1.6X1.9 PLUG LRG (Mesh General) ×2 IMPLANT
MESH HERNIA PLUG LRG (Mesh General) ×1 IMPLANT
MESH VENTRALEX ST 1-7/10 CRC S (Mesh General) ×3 IMPLANT
NS IRRIG 1000ML POUR BTL (IV SOLUTION) ×3 IMPLANT
PACK MINOR (CUSTOM PROCEDURE TRAY) ×3 IMPLANT
PAD ARMBOARD 7.5X6 YLW CONV (MISCELLANEOUS) ×3 IMPLANT
PENCIL SMOKE EVACUATOR (MISCELLANEOUS) ×3 IMPLANT
SET BASIN LINEN APH (SET/KITS/TRAYS/PACK) ×3 IMPLANT
SOL PREP PROV IODINE SCRUB 4OZ (MISCELLANEOUS) ×3 IMPLANT
SUT ETHIBOND NAB MO 7 #0 18IN (SUTURE) ×3 IMPLANT
SUT MNCRL AB 4-0 PS2 18 (SUTURE) ×6 IMPLANT
SUT NOVA NAB GS-22 2 2-0 T-19 (SUTURE) ×12 IMPLANT
SUT VIC AB 2-0 CT1 27 (SUTURE) ×6
SUT VIC AB 2-0 CT1 TAPERPNT 27 (SUTURE) ×4 IMPLANT
SUT VIC AB 3-0 SH 27 (SUTURE) ×6
SUT VIC AB 3-0 SH 27X BRD (SUTURE) ×4 IMPLANT

## 2021-05-07 NOTE — Anesthesia Preprocedure Evaluation (Addendum)
Anesthesia Evaluation  Patient identified by MRN, date of birth, ID band Patient awake    Reviewed: Allergy & Precautions, NPO status , Patient's Chart, lab work & pertinent test results  History of Anesthesia Complications Negative for: history of anesthetic complications  Airway Mallampati: I  TM Distance: >3 FB Neck ROM: Full    Dental  (+) Edentulous Upper, Edentulous Lower   Pulmonary shortness of breath and with exertion, COPD, former smoker,    Pulmonary exam normal breath sounds clear to auscultation       Cardiovascular Exercise Tolerance: Poor + CAD, + CABG, +CHF and + DOE  Normal cardiovascular exam Rhythm:Regular Rate:Normal  1. Left ventricular ejection fraction, by estimation, is 60 to 65%. The left ventricle has normal function. The left ventricle has no regional wall motion abnormalities. Left ventricular diastolic parameters are consistent with Grade I diastolic dysfunction (impaired relaxation).  2. Right ventricular systolic function is normal. The right ventricular size is normal. Tricuspid regurgitation signal is inadequate for assessing PA pressure.  3. The mitral valve is grossly normal. Trivial mitral valve  regurgitation.  4. The aortic valve is tricuspid. Aortic valve regurgitation is not visualized.  5. The inferior vena cava is normal in size with greater than 50% respiratory variability, suggesting right atrial pressure of 3 mmHg.    Neuro/Psych  Headaches, negative psych ROS   GI/Hepatic Neg liver ROS, PUD, GERD  Medicated and Controlled,  Endo/Other  negative endocrine ROS  Renal/GU negative Renal ROS     Musculoskeletal  (+) Arthritis ,   Abdominal   Peds  Hematology negative hematology ROS (+)   Anesthesia Other Findings Vision problems   Reproductive/Obstetrics negative OB ROS                            Anesthesia Physical Anesthesia Plan  ASA:  3  Anesthesia Plan: General   Post-op Pain Management:    Induction: Intravenous  PONV Risk Score and Plan: 3 and Ondansetron and Dexamethasone  Airway Management Planned: Oral ETT  Additional Equipment:   Intra-op Plan:   Post-operative Plan: Extubation in OR  Informed Consent: I have reviewed the patients History and Physical, chart, labs and discussed the procedure including the risks, benefits and alternatives for the proposed anesthesia with the patient or authorized representative who has indicated his/her understanding and acceptance.     Dental advisory given  Plan Discussed with: CRNA and Surgeon  Anesthesia Plan Comments:        Anesthesia Quick Evaluation

## 2021-05-07 NOTE — Anesthesia Procedure Notes (Signed)
Procedure Name: Intubation Date/Time: 05/07/2021 9:56 AM Performed by: Riki Sheer, CRNA Pre-anesthesia Checklist: Patient identified, Emergency Drugs available, Suction available, Patient being monitored and Timeout performed Patient Re-evaluated:Patient Re-evaluated prior to induction Oxygen Delivery Method: Circle system utilized Preoxygenation: Pre-oxygenation with 100% oxygen Ventilation: Mask ventilation without difficulty Laryngoscope Size: Miller and 2 Grade View: Grade I Tube type: Oral Tube size: 7.5 mm Number of attempts: 1 Airway Equipment and Method: Stylet Placement Confirmation: ETT inserted through vocal cords under direct vision, positive ETCO2, CO2 detector and breath sounds checked- equal and bilateral Secured at: 22 cm Tube secured with: Tape Dental Injury: Teeth and Oropharynx as per pre-operative assessment

## 2021-05-07 NOTE — Transfer of Care (Signed)
Immediate Anesthesia Transfer of Care Note  Patient: Benjamin [name]  Procedure(s) Performed: HERNIA REPAIR UMBILICAL ADULT (Abdomen) HERNIA REPAIR INGUINAL ADULT (Right: Inguinal)  Patient Location: PACU  Anesthesia Type:General  Level of Consciousness: awake, alert  and oriented  Airway & Oxygen Therapy: Patient Spontanous Breathing and Patient connected to nasal cannula oxygen  Post-op Assessment: Report given to RN and Post -op Vital signs reviewed and stable  Post vital signs: Reviewed and stable  Last Vitals:  Vitals Value Taken Time  BP 114/94 05/07/21 1115  Temp    Pulse 71 05/07/21 1117  Resp 17 05/07/21 1117  SpO2 91 % 05/07/21 1117  Vitals shown include unvalidated device data.  Last Pain:  Vitals:   05/07/21 0925  TempSrc: Oral  PainSc: 4       Patients Stated Pain Goal: 8 ([id number])  Complications: No notable events documented.

## 2021-05-07 NOTE — Op Note (Signed)
Patient:  Benjamin [name]  DOB:  May 15, 1941  MRN:  [id number]   Preop Diagnosis: Right inguinal hernia, umbilical hernia  Postop Diagnosis: Same  Procedure: Right inguinal herniorrhaphy with mesh, umbilical herniorrhaphy with mesh  Surgeon: [name], MD  Anes: General  Indications: Patient is a 80 year old white male who presents with both a symptomatic right inguinal hernia as well as an umbilical hernia.  The risks and benefits of both procedures including bleeding, infection, mesh use, and the possibility of recurrence of the hernias were fully explained to the patient, who gave informed consent.  Procedure note: The patient was placed in supine position.  After general anesthesia was administered, the abdomen was prepped and draped using the usual sterile technique with Betadine.  Surgical site confirmation was performed.  I first performed a right inguinal hernia repair.  An incision was made in the right groin region down to the external oblique aponeurosis.  The aponeurosis was incised to the external ring.  A Penrose drain was placed around the spermatic cord.  The vas deferens was noted within the spermatic cord.  The ilioinguinal nerve was identified and retracted from the operative field.  The patient had a large direct hernia as well as a residual indirect hernia.  The indirect hernia cord was highly ligated using a 2-0 Novafil suture.  The direct hernia was incised at its base and inverted.  A large Bard PerFix plug was then inserted and secured circumferentially to the transversalis fascia using 2-0 Novafil interrupted sutures.  An onlay patch was placed along the floor of the inguinal canal and secured superiorly to the conjoined tendon and inferiorly to the shelving edge of Poupart's ligament using 2-0 Novafil interrupted sutures.  The internal ring was recreated using a 2-0 Novafil interrupted suture.  [name] was then placed over the mesh into the subcutaneous tissue.  The  external Bleich aponeurosis was reapproximated using a 2-0 Vicryl running suture.  The subcutaneous layer was reapproximated using 3-0 Vicryl interrupted suture.  The skin was closed using a 4-0 Monocryl subcuticular suture.  Dermabond was applied.  Next, the umbilical hernia repair was performed.  An infraumbilical incision was made down to the fascia.  The umbilicus was freed away from the underlying fascia.  The hernia sac was then excised down to its base.  The resultant defect was approximately 1-1/2 cm in its greatest diameter.  A 4.7cm Bard Ventralax ST patch was then placed and secured to the fascia using 2-0 Ethibond interrupted sutures.  The overlying fascia was reapproximated using 2-0 Ethibond interrupted sutures.  [name] was then placed over the fascia.  The base of the umbilicus was secured back to the fascia using a 2-0 Vicryl interrupted suture.  Subcutaneous layer was reapproximated using a 3-0 Vicryl interrupted suture.  The skin was closed using a 4-0 Monocryl subcuticular suture.  Dermabond was applied.  All tape and needle counts were correct at the end of the procedures.  The patient was awakened and transferred to PACU in stable condition.  Complications: None  EBL: Minimal  Specimen: None

## 2021-05-07 NOTE — Interval H&P Note (Signed)
History and Physical Interval Note:  05/07/2021 9:29 AM  Benjamin [name]  has presented today for surgery, with the diagnosis of Umbilical Hernia Right Inguinal hernia.  The various methods of treatment have been discussed with the patient and family. After consideration of risks, benefits and other options for treatment, the patient has consented to  Procedure(s): HERNIA REPAIR UMBILICAL ADULT (N/A) HERNIA REPAIR INGUINAL ADULT (Right) as a surgical intervention.  The patient's history has been reviewed, patient examined, no change in status, stable for surgery.  I have reviewed the patient's chart and labs.  Questions were answered to the patient's satisfaction.     [name]

## 2021-05-07 NOTE — Anesthesia Postprocedure Evaluation (Signed)
Anesthesia Post Note  Patient: Benjamin [name]  Procedure(s) Performed: HERNIA REPAIR UMBILICAL ADULT (Abdomen) HERNIA REPAIR INGUINAL ADULT (Right: Inguinal)  Patient location during evaluation: PACU Anesthesia Type: General Level of consciousness: awake and alert and oriented Pain management: pain level controlled Vital Signs Assessment: post-procedure vital signs reviewed and stable Respiratory status: spontaneous breathing and respiratory function stable Cardiovascular status: blood pressure returned to baseline and stable Postop Assessment: no apparent nausea or vomiting Anesthetic complications: no   No notable events documented.   Last Vitals:  Vitals:   05/07/21 1244 05/07/21 1245  BP: 119/81   Pulse: 81   Resp: 16   Temp:    SpO2: 91% 91%    Last Pain:  Vitals:   05/07/21 1245  TempSrc:   PainSc: 5                  Lajuan Godbee C Leslye Puccini

## 2021-05-08 ENCOUNTER — Encounter (HOSPITAL_COMMUNITY): Payer: Self-pay | Admitting: General Surgery

## 2021-05-09 ENCOUNTER — Encounter (HOSPITAL_COMMUNITY): Payer: Self-pay | Admitting: General Surgery

## 2021-05-15 ENCOUNTER — Ambulatory Visit (INDEPENDENT_AMBULATORY_CARE_PROVIDER_SITE_OTHER): Payer: Medicare Other | Admitting: General Surgery

## 2021-05-15 ENCOUNTER — Encounter: Payer: Self-pay | Admitting: General Surgery

## 2021-05-15 ENCOUNTER — Other Ambulatory Visit: Payer: Self-pay

## 2021-05-15 ENCOUNTER — Encounter: Payer: Medicare Other | Admitting: General Surgery

## 2021-05-15 VITALS — BP 106/70 | HR 76 | Temp 98.7°F | Resp 16 | Ht 71.0 in | Wt 188.0 lb

## 2021-05-15 DIAGNOSIS — Z09 Encounter for follow-up examination after completed treatment for conditions other than malignant neoplasm: Secondary | ICD-10-CM

## 2021-05-15 NOTE — Progress Notes (Signed)
Subjective:     Benjamin [name]  Here for postoperative visit, s/p right inguinal herniorrhaphy and umbilical herniorrhaphy.  Doing well.  Had some swelling but took a water pill and feels much better. Objective:    BP 106/70   Pulse 76   Temp 98.7 F (37.1 C) (Other (Comment))   Resp 16   Ht '5\' 11"'$  (1.803 m)   Wt 188 lb (85.3 kg)   SpO2 90%   BMI 26.22 kg/m   General:  alert, cooperative, and no distress  Abdomen soft, both incisional healing well.  Resolving ecchymoses.     Assessment:    Doing well postoperatively.    Plan:   Increase activity as able.  Follow up here prn.

## 2021-05-17 ENCOUNTER — Encounter: Payer: Medicare Other | Admitting: General Surgery

## 2021-05-29 ENCOUNTER — Encounter: Payer: Self-pay | Admitting: General Surgery

## 2021-05-29 ENCOUNTER — Other Ambulatory Visit: Payer: Self-pay

## 2021-05-29 ENCOUNTER — Ambulatory Visit (INDEPENDENT_AMBULATORY_CARE_PROVIDER_SITE_OTHER): Payer: Medicare Other | Admitting: General Surgery

## 2021-05-29 VITALS — BP 107/79 | HR 99 | Temp 98.7°F | Resp 18 | Ht 71.0 in | Wt 189.0 lb

## 2021-05-29 DIAGNOSIS — Z09 Encounter for follow-up examination after completed treatment for conditions other than malignant neoplasm: Secondary | ICD-10-CM

## 2021-05-29 MED ORDER — GABAPENTIN 300 MG PO CAPS: 300.0000 mg | ORAL_CAPSULE | Freq: Three times a day (TID) | ORAL | 1 refills | Status: DC

## 2021-05-29 NOTE — Progress Notes (Signed)
Subjective:     Benjamin [name]  Patient here for follow-up, status post right inguinal herniorrhaphy with mesh as well as umbilical herniorrhaphy with mesh.  He states that his incision has been hurting more recently, with radiation to the right scrotum and testicle.  It hurts to touch.  Is sometimes a burning sensation. Objective:    BP 107/79   Pulse 99   Temp 98.7 F (37.1 C) (Other (Comment))   Resp 18   Ht '5\' 11"'$  (1.803 m)   Wt 189 lb (85.7 kg)   SpO2 90%   BMI 26.36 kg/m   General:  alert, cooperative, and no distress  Right inguinal incision is well-healed.  No hematoma or seroma present.  No swelling of the right testicle noted.     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  It will hopefully resolve with time.  I told him this may take several months to resolve.  He does not tolerate NSAIDs.  We will start Neurontin 300 mg p.o. 3 times daily.  He was instructed to follow-up with me in 1 month.

## 2021-06-20 ENCOUNTER — Other Ambulatory Visit: Payer: Self-pay

## 2021-06-20 ENCOUNTER — Ambulatory Visit (INDEPENDENT_AMBULATORY_CARE_PROVIDER_SITE_OTHER): Payer: Medicare Other

## 2021-06-20 ENCOUNTER — Encounter (INDEPENDENT_AMBULATORY_CARE_PROVIDER_SITE_OTHER): Payer: Self-pay

## 2021-06-20 DIAGNOSIS — Z23 Encounter for immunization: Secondary | ICD-10-CM

## 2021-06-28 ENCOUNTER — Ambulatory Visit: Payer: Medicare Other | Admitting: Nurse Practitioner

## 2021-06-28 DIAGNOSIS — M9903 Segmental and somatic dysfunction of lumbar region: Secondary | ICD-10-CM | POA: Diagnosis not present

## 2021-06-28 DIAGNOSIS — M5416 Radiculopathy, lumbar region: Secondary | ICD-10-CM | POA: Diagnosis not present

## 2021-06-28 DIAGNOSIS — S39012A Strain of muscle, fascia and tendon of lower back, initial encounter: Secondary | ICD-10-CM | POA: Diagnosis not present

## 2021-06-28 DIAGNOSIS — M9905 Segmental and somatic dysfunction of pelvic region: Secondary | ICD-10-CM | POA: Diagnosis not present

## 2021-07-06 DIAGNOSIS — S39012A Strain of muscle, fascia and tendon of lower back, initial encounter: Secondary | ICD-10-CM | POA: Diagnosis not present

## 2021-07-06 DIAGNOSIS — M9905 Segmental and somatic dysfunction of pelvic region: Secondary | ICD-10-CM | POA: Diagnosis not present

## 2021-07-06 DIAGNOSIS — M5416 Radiculopathy, lumbar region: Secondary | ICD-10-CM | POA: Diagnosis not present

## 2021-07-06 DIAGNOSIS — M9903 Segmental and somatic dysfunction of lumbar region: Secondary | ICD-10-CM | POA: Diagnosis not present

## 2021-07-09 DIAGNOSIS — S39012A Strain of muscle, fascia and tendon of lower back, initial encounter: Secondary | ICD-10-CM | POA: Diagnosis not present

## 2021-07-09 DIAGNOSIS — M9905 Segmental and somatic dysfunction of pelvic region: Secondary | ICD-10-CM | POA: Diagnosis not present

## 2021-07-09 DIAGNOSIS — M9903 Segmental and somatic dysfunction of lumbar region: Secondary | ICD-10-CM | POA: Diagnosis not present

## 2021-07-09 DIAGNOSIS — M5416 Radiculopathy, lumbar region: Secondary | ICD-10-CM | POA: Diagnosis not present

## 2021-07-11 DIAGNOSIS — M9903 Segmental and somatic dysfunction of lumbar region: Secondary | ICD-10-CM | POA: Diagnosis not present

## 2021-07-11 DIAGNOSIS — S39012A Strain of muscle, fascia and tendon of lower back, initial encounter: Secondary | ICD-10-CM | POA: Diagnosis not present

## 2021-07-11 DIAGNOSIS — M5416 Radiculopathy, lumbar region: Secondary | ICD-10-CM | POA: Diagnosis not present

## 2021-07-11 DIAGNOSIS — M9905 Segmental and somatic dysfunction of pelvic region: Secondary | ICD-10-CM | POA: Diagnosis not present

## 2021-07-20 DIAGNOSIS — M5416 Radiculopathy, lumbar region: Secondary | ICD-10-CM | POA: Diagnosis not present

## 2021-07-20 DIAGNOSIS — M9905 Segmental and somatic dysfunction of pelvic region: Secondary | ICD-10-CM | POA: Diagnosis not present

## 2021-07-20 DIAGNOSIS — S39012A Strain of muscle, fascia and tendon of lower back, initial encounter: Secondary | ICD-10-CM | POA: Diagnosis not present

## 2021-07-20 DIAGNOSIS — M9903 Segmental and somatic dysfunction of lumbar region: Secondary | ICD-10-CM | POA: Diagnosis not present

## 2021-07-23 DIAGNOSIS — J449 Chronic obstructive pulmonary disease, unspecified: Secondary | ICD-10-CM | POA: Diagnosis not present

## 2021-07-23 DIAGNOSIS — R059 Cough, unspecified: Secondary | ICD-10-CM | POA: Diagnosis not present

## 2021-07-23 DIAGNOSIS — R7303 Prediabetes: Secondary | ICD-10-CM | POA: Diagnosis not present

## 2021-07-24 LAB — CBC WITH DIFFERENTIAL/PLATELET
Basophils Absolute: 0.1 10*3/uL (ref 0.0–0.2)
Basos: 1 %
EOS (ABSOLUTE): 0.4 10*3/uL (ref 0.0–0.4)
Eos: 4 %
Hematocrit: 48.9 % (ref 37.5–51.0)
Hemoglobin: 16 g/dL (ref 13.0–17.7)
Immature Grans (Abs): 0 10*3/uL (ref 0.0–0.1)
Immature Granulocytes: 0 %
Lymphocytes Absolute: 1.3 10*3/uL (ref 0.7–3.1)
Lymphs: 13 %
MCH: 30.4 pg (ref 26.6–33.0)
MCHC: 32.7 g/dL (ref 31.5–35.7)
MCV: 93 fL (ref 79–97)
Monocytes Absolute: 0.7 10*3/uL (ref 0.1–0.9)
Monocytes: 7 %
Neutrophils Absolute: 7.6 10*3/uL — ABNORMAL HIGH (ref 1.4–7.0)
Neutrophils: 75 %
Platelets: 272 10*3/uL (ref 150–450)
RBC: 5.27 x10E6/uL (ref 4.14–5.80)
RDW: 13.4 % (ref 11.6–15.4)
WBC: 10 10*3/uL (ref 3.4–10.8)

## 2021-07-24 LAB — CMP14+EGFR
ALT: 23 IU/L (ref 0–44)
AST: 22 IU/L (ref 0–40)
Albumin/Globulin Ratio: 2.2 (ref 1.2–2.2)
Albumin: 4.4 g/dL (ref 3.7–4.7)
Alkaline Phosphatase: 68 IU/L (ref 44–121)
BUN/Creatinine Ratio: 13 (ref 10–24)
BUN: 14 mg/dL (ref 8–27)
Bilirubin Total: 1.5 mg/dL — ABNORMAL HIGH (ref 0.0–1.2)
CO2: 21 mmol/L (ref 20–29)
Calcium: 9.4 mg/dL (ref 8.6–10.2)
Chloride: 107 mmol/L — ABNORMAL HIGH (ref 96–106)
Creatinine, Ser: 1.12 mg/dL (ref 0.76–1.27)
Globulin, Total: 2 g/dL (ref 1.5–4.5)
Glucose: 100 mg/dL — ABNORMAL HIGH (ref 70–99)
Potassium: 4.8 mmol/L (ref 3.5–5.2)
Sodium: 146 mmol/L — ABNORMAL HIGH (ref 134–144)
Total Protein: 6.4 g/dL (ref 6.0–8.5)
eGFR: 67 mL/min/{1.73_m2} (ref >59)

## 2021-07-24 LAB — HEMOGLOBIN A1C
Est. average glucose Bld gHb Est-mCnc: 128 mg/dL
Hgb A1c MFr Bld: 6.1 % — ABNORMAL HIGH (ref 4.8–5.6)

## 2021-07-24 LAB — LIPID PANEL WITH LDL/HDL RATIO
Cholesterol, Total: 78 mg/dL — ABNORMAL LOW (ref 100–199)
HDL: 39 mg/dL — ABNORMAL LOW (ref >39)
LDL Chol Calc (NIH): 22 mg/dL (ref 0–99)
LDL/HDL Ratio: 0.6 ratio (ref 0.0–3.6)
Triglycerides: 84 mg/dL (ref 0–149)
VLDL Cholesterol Cal: 17 mg/dL (ref 5–40)

## 2021-07-25 DIAGNOSIS — M5416 Radiculopathy, lumbar region: Secondary | ICD-10-CM | POA: Diagnosis not present

## 2021-07-25 DIAGNOSIS — S39012A Strain of muscle, fascia and tendon of lower back, initial encounter: Secondary | ICD-10-CM | POA: Diagnosis not present

## 2021-07-25 DIAGNOSIS — M9903 Segmental and somatic dysfunction of lumbar region: Secondary | ICD-10-CM | POA: Diagnosis not present

## 2021-07-25 DIAGNOSIS — M9905 Segmental and somatic dysfunction of pelvic region: Secondary | ICD-10-CM | POA: Diagnosis not present

## 2021-07-27 DIAGNOSIS — M5416 Radiculopathy, lumbar region: Secondary | ICD-10-CM | POA: Diagnosis not present

## 2021-07-27 DIAGNOSIS — M9903 Segmental and somatic dysfunction of lumbar region: Secondary | ICD-10-CM | POA: Diagnosis not present

## 2021-07-27 DIAGNOSIS — S39012A Strain of muscle, fascia and tendon of lower back, initial encounter: Secondary | ICD-10-CM | POA: Diagnosis not present

## 2021-07-27 DIAGNOSIS — M9905 Segmental and somatic dysfunction of pelvic region: Secondary | ICD-10-CM | POA: Diagnosis not present

## 2021-07-30 ENCOUNTER — Ambulatory Visit: Payer: Medicare Other | Admitting: Nurse Practitioner

## 2021-08-01 DIAGNOSIS — M5416 Radiculopathy, lumbar region: Secondary | ICD-10-CM | POA: Diagnosis not present

## 2021-08-01 DIAGNOSIS — M9905 Segmental and somatic dysfunction of pelvic region: Secondary | ICD-10-CM | POA: Diagnosis not present

## 2021-08-01 DIAGNOSIS — M9903 Segmental and somatic dysfunction of lumbar region: Secondary | ICD-10-CM | POA: Diagnosis not present

## 2021-08-01 DIAGNOSIS — S39012A Strain of muscle, fascia and tendon of lower back, initial encounter: Secondary | ICD-10-CM | POA: Diagnosis not present

## 2021-08-10 DIAGNOSIS — S39012A Strain of muscle, fascia and tendon of lower back, initial encounter: Secondary | ICD-10-CM | POA: Diagnosis not present

## 2021-08-10 DIAGNOSIS — M9903 Segmental and somatic dysfunction of lumbar region: Secondary | ICD-10-CM | POA: Diagnosis not present

## 2021-08-10 DIAGNOSIS — M9905 Segmental and somatic dysfunction of pelvic region: Secondary | ICD-10-CM | POA: Diagnosis not present

## 2021-08-10 DIAGNOSIS — M5416 Radiculopathy, lumbar region: Secondary | ICD-10-CM | POA: Diagnosis not present

## 2021-08-17 DIAGNOSIS — M5416 Radiculopathy, lumbar region: Secondary | ICD-10-CM | POA: Diagnosis not present

## 2021-08-17 DIAGNOSIS — M9905 Segmental and somatic dysfunction of pelvic region: Secondary | ICD-10-CM | POA: Diagnosis not present

## 2021-08-17 DIAGNOSIS — M9903 Segmental and somatic dysfunction of lumbar region: Secondary | ICD-10-CM | POA: Diagnosis not present

## 2021-08-17 DIAGNOSIS — S39012A Strain of muscle, fascia and tendon of lower back, initial encounter: Secondary | ICD-10-CM | POA: Diagnosis not present

## 2021-08-25 ENCOUNTER — Encounter (HOSPITAL_COMMUNITY): Payer: Self-pay

## 2021-08-25 ENCOUNTER — Other Ambulatory Visit: Payer: Self-pay

## 2021-08-25 ENCOUNTER — Inpatient Hospital Stay (HOSPITAL_COMMUNITY)
Admission: EM | Admit: 2021-08-25 | Discharge: 2021-08-27 | DRG: 177 | Disposition: A | Discharge status: Home Health | Payer: Medicare Other | Attending: Family Medicine | Admitting: Family Medicine

## 2021-08-25 ENCOUNTER — Emergency Department (HOSPITAL_COMMUNITY): Payer: Medicare Other

## 2021-08-25 DIAGNOSIS — R140 Abdominal distension (gaseous): Secondary | ICD-10-CM | POA: Diagnosis not present

## 2021-08-25 DIAGNOSIS — R0602 Shortness of breath: Secondary | ICD-10-CM | POA: Diagnosis not present

## 2021-08-25 DIAGNOSIS — Z888 Allergy status to other drugs, medicaments and biological substances status: Secondary | ICD-10-CM | POA: Diagnosis not present

## 2021-08-25 DIAGNOSIS — J9811 Atelectasis: Secondary | ICD-10-CM | POA: Diagnosis not present

## 2021-08-25 DIAGNOSIS — Z951 Presence of aortocoronary bypass graft: Secondary | ICD-10-CM

## 2021-08-25 DIAGNOSIS — J40 Bronchitis, not specified as acute or chronic: Secondary | ICD-10-CM

## 2021-08-25 DIAGNOSIS — Z9049 Acquired absence of other specified parts of digestive tract: Secondary | ICD-10-CM

## 2021-08-25 DIAGNOSIS — Z79899 Other long term (current) drug therapy: Secondary | ICD-10-CM

## 2021-08-25 DIAGNOSIS — W010XXA Fall on same level from slipping, tripping and stumbling without subsequent striking against object, initial encounter: Secondary | ICD-10-CM | POA: Diagnosis present

## 2021-08-25 DIAGNOSIS — Z8249 Family history of ischemic heart disease and other diseases of the circulatory system: Secondary | ICD-10-CM | POA: Diagnosis not present

## 2021-08-25 DIAGNOSIS — J9601 Acute respiratory failure with hypoxia: Secondary | ICD-10-CM | POA: Diagnosis present

## 2021-08-25 DIAGNOSIS — U071 COVID-19: Principal | ICD-10-CM | POA: Diagnosis present

## 2021-08-25 DIAGNOSIS — I2510 Atherosclerotic heart disease of native coronary artery without angina pectoris: Secondary | ICD-10-CM | POA: Diagnosis present

## 2021-08-25 DIAGNOSIS — N400 Enlarged prostate without lower urinary tract symptoms: Secondary | ICD-10-CM | POA: Diagnosis present

## 2021-08-25 DIAGNOSIS — Z91048 Other nonmedicinal substance allergy status: Secondary | ICD-10-CM

## 2021-08-25 DIAGNOSIS — Z9841 Cataract extraction status, right eye: Secondary | ICD-10-CM

## 2021-08-25 DIAGNOSIS — Z9842 Cataract extraction status, left eye: Secondary | ICD-10-CM

## 2021-08-25 DIAGNOSIS — Z87891 Personal history of nicotine dependence: Secondary | ICD-10-CM | POA: Diagnosis not present

## 2021-08-25 DIAGNOSIS — Z91041 Radiographic dye allergy status: Secondary | ICD-10-CM

## 2021-08-25 DIAGNOSIS — J441 Chronic obstructive pulmonary disease with (acute) exacerbation: Secondary | ICD-10-CM | POA: Diagnosis present

## 2021-08-25 DIAGNOSIS — Z961 Presence of intraocular lens: Secondary | ICD-10-CM | POA: Diagnosis present

## 2021-08-25 DIAGNOSIS — K219 Gastro-esophageal reflux disease without esophagitis: Secondary | ICD-10-CM | POA: Diagnosis present

## 2021-08-25 DIAGNOSIS — Z86010 Personal history of colonic polyps: Secondary | ICD-10-CM

## 2021-08-25 DIAGNOSIS — R109 Unspecified abdominal pain: Secondary | ICD-10-CM | POA: Diagnosis not present

## 2021-08-25 DIAGNOSIS — R0902 Hypoxemia: Secondary | ICD-10-CM | POA: Diagnosis not present

## 2021-08-25 DIAGNOSIS — Z85828 Personal history of other malignant neoplasm of skin: Secondary | ICD-10-CM

## 2021-08-25 DIAGNOSIS — R059 Cough, unspecified: Secondary | ICD-10-CM | POA: Diagnosis not present

## 2021-08-25 LAB — CBC WITH DIFFERENTIAL/PLATELET
Abs Immature Granulocytes: 0.01 10*3/uL (ref 0.00–0.07)
Basophils Absolute: 0 10*3/uL (ref 0.0–0.1)
Basophils Relative: 1 %
Eosinophils Absolute: 0 10*3/uL (ref 0.0–0.5)
Eosinophils Relative: 1 %
HCT: 45 % (ref 39.0–52.0)
Hemoglobin: 15.2 g/dL (ref 13.0–17.0)
Immature Granulocytes: 0 %
Lymphocytes Relative: 14 %
Lymphs Abs: 0.6 10*3/uL — ABNORMAL LOW (ref 0.7–4.0)
MCH: 31.6 pg (ref 26.0–34.0)
MCHC: 33.8 g/dL (ref 30.0–36.0)
MCV: 93.6 fL (ref 80.0–100.0)
Monocytes Absolute: 0.7 10*3/uL (ref 0.1–1.0)
Monocytes Relative: 15 %
Neutro Abs: 3.2 10*3/uL (ref 1.7–7.7)
Neutrophils Relative %: 69 %
Platelets: 195 10*3/uL (ref 150–400)
RBC: 4.81 MIL/uL (ref 4.22–5.81)
RDW: 13.8 % (ref 11.5–15.5)
WBC: 4.6 10*3/uL (ref 4.0–10.5)
nRBC: 0 % (ref 0.0–0.2)

## 2021-08-25 LAB — RESP PANEL BY RT-PCR (FLU A&B, COVID) ARPGX2
Influenza A by PCR: NEGATIVE
Influenza B by PCR: NEGATIVE
SARS Coronavirus 2 by RT PCR: POSITIVE — AB

## 2021-08-25 LAB — BASIC METABOLIC PANEL
Anion gap: 7 (ref 5–15)
BUN: 21 mg/dL (ref 8–23)
CO2: 21 mmol/L — ABNORMAL LOW (ref 22–32)
Calcium: 8.1 mg/dL — ABNORMAL LOW (ref 8.9–10.3)
Chloride: 105 mmol/L (ref 98–111)
Creatinine, Ser: 1.22 mg/dL (ref 0.61–1.24)
GFR, Estimated: >60 mL/min (ref >60)
Glucose, Bld: 123 mg/dL — ABNORMAL HIGH (ref 70–99)
Potassium: 4 mmol/L (ref 3.5–5.1)
Sodium: 133 mmol/L — ABNORMAL LOW (ref 135–145)

## 2021-08-25 LAB — D-DIMER, QUANTITATIVE: D-Dimer, Quant: 0.48 ug/mL-FEU (ref 0.00–0.50)

## 2021-08-25 LAB — BRAIN NATRIURETIC PEPTIDE: B Natriuretic Peptide: 83 pg/mL (ref 0.0–100.0)

## 2021-08-25 LAB — FERRITIN: Ferritin: 48 ng/mL (ref 24–336)

## 2021-08-25 LAB — C-REACTIVE PROTEIN: CRP: 1.6 mg/dL — ABNORMAL HIGH (ref <1.0)

## 2021-08-25 MED ORDER — IPRATROPIUM-ALBUTEROL 0.5-2.5 (3) MG/3ML IN SOLN
3.0000 mL | Freq: Four times a day (QID) | RESPIRATORY_TRACT | Status: DC
Start: 2021-08-25 — End: 2021-08-25

## 2021-08-25 MED ORDER — ONDANSETRON HCL 4 MG PO TABS: 4.0000 mg | ORAL_TABLET | Freq: Four times a day (QID) | ORAL | Status: DC | PRN

## 2021-08-25 MED ORDER — METOPROLOL TARTRATE 25 MG PO TABS
12.5000 mg | ORAL_TABLET | Freq: Two times a day (BID) | ORAL | Status: DC
  Administered 2021-08-25 – 2021-08-27 (×4): 12.5 mg via ORAL
  Filled 2021-08-25 (×4): qty 1

## 2021-08-25 MED ORDER — SODIUM CHLORIDE 0.9 % IV SOLN
100.0000 mg | INTRAVENOUS | Status: AC
  Administered 2021-08-25 (×2): 100 mg via INTRAVENOUS
  Filled 2021-08-25 (×2): qty 20

## 2021-08-25 MED ORDER — TAMSULOSIN HCL 0.4 MG PO CAPS
0.8000 mg | ORAL_CAPSULE | Freq: Every day | ORAL | Status: DC
  Administered 2021-08-26 – 2021-08-27 (×2): 0.8 mg via ORAL
  Filled 2021-08-25 (×2): qty 2

## 2021-08-25 MED ORDER — ORAL CARE MOUTH RINSE
15.0000 mL | Freq: Two times a day (BID) | OROMUCOSAL | Status: DC
  Administered 2021-08-25: 15 mL via OROMUCOSAL

## 2021-08-25 MED ORDER — HYDROCODONE-ACETAMINOPHEN 5-325 MG PO TABS: 1.0000 | ORAL_TABLET | ORAL | Status: DC | PRN

## 2021-08-25 MED ORDER — IPRATROPIUM-ALBUTEROL 20-100 MCG/ACT IN AERS
INHALATION_SPRAY | RESPIRATORY_TRACT | Status: AC
  Administered 2021-08-25: 1 via RESPIRATORY_TRACT
  Filled 2021-08-25: qty 4

## 2021-08-25 MED ORDER — IPRATROPIUM-ALBUTEROL 0.5-2.5 (3) MG/3ML IN SOLN
3.0000 mL | Freq: Once | RESPIRATORY_TRACT | Status: AC
  Administered 2021-08-25: 3 mL via RESPIRATORY_TRACT
  Filled 2021-08-25: qty 3

## 2021-08-25 MED ORDER — SODIUM CHLORIDE 0.9 % IV SOLN
100.0000 mg | Freq: Every day | INTRAVENOUS | Status: DC
  Administered 2021-08-26 – 2021-08-27 (×2): 100 mg via INTRAVENOUS
  Filled 2021-08-25 (×2): qty 20

## 2021-08-25 MED ORDER — HYDRALAZINE HCL 25 MG PO TABS: 25.0000 mg | ORAL_TABLET | Freq: Four times a day (QID) | ORAL | Status: DC | PRN

## 2021-08-25 MED ORDER — IPRATROPIUM-ALBUTEROL 0.5-2.5 (3) MG/3ML IN SOLN: 3.0000 mL | RESPIRATORY_TRACT | Status: DC | PRN

## 2021-08-25 MED ORDER — POLYETHYLENE GLYCOL 3350 17 G PO PACK: 17.0000 g | PACK | Freq: Every day | ORAL | Status: DC | PRN

## 2021-08-25 MED ORDER — ONDANSETRON HCL 4 MG/2ML IJ SOLN: 4.0000 mg | Freq: Four times a day (QID) | INTRAMUSCULAR | Status: DC | PRN

## 2021-08-25 MED ORDER — ACETAMINOPHEN 650 MG RE SUPP: 650.0000 mg | Freq: Four times a day (QID) | RECTAL | Status: DC | PRN

## 2021-08-25 MED ORDER — ACETAMINOPHEN 325 MG PO TABS
650.0000 mg | ORAL_TABLET | Freq: Four times a day (QID) | ORAL | Status: DC | PRN
  Filled 2021-08-25: qty 2

## 2021-08-25 MED ORDER — GUAIFENESIN-DM 100-10 MG/5ML PO SYRP: 10.0000 mL | ORAL_SOLUTION | ORAL | Status: DC | PRN

## 2021-08-25 MED ORDER — METHYLPREDNISOLONE SODIUM SUCC 125 MG IJ SOLR
125.0000 mg | Freq: Once | INTRAMUSCULAR | Status: AC
  Administered 2021-08-25: 125 mg via INTRAVENOUS
  Filled 2021-08-25: qty 2

## 2021-08-25 MED ORDER — IPRATROPIUM-ALBUTEROL 20-100 MCG/ACT IN AERS: 1.0000 | INHALATION_SPRAY | RESPIRATORY_TRACT | Status: DC | PRN

## 2021-08-25 MED ORDER — ENOXAPARIN SODIUM 40 MG/0.4ML IJ SOSY
40.0000 mg | PREFILLED_SYRINGE | INTRAMUSCULAR | Status: DC
  Administered 2021-08-25 – 2021-08-26 (×2): 40 mg via SUBCUTANEOUS
  Filled 2021-08-25 (×2): qty 0.4

## 2021-08-25 MED ORDER — METHYLPREDNISOLONE SODIUM SUCC 125 MG IJ SOLR
0.5000 mg/kg | Freq: Two times a day (BID) | INTRAMUSCULAR | Status: DC
  Administered 2021-08-26 – 2021-08-27 (×3): 45 mg via INTRAVENOUS
  Filled 2021-08-25 (×3): qty 2

## 2021-08-25 MED ORDER — TAMSULOSIN HCL 0.4 MG PO CAPS: 0.8000 mg | ORAL_CAPSULE | Freq: Every day | ORAL | Status: DC

## 2021-08-25 MED ORDER — IPRATROPIUM-ALBUTEROL 20-100 MCG/ACT IN AERS
1.0000 | INHALATION_SPRAY | Freq: Four times a day (QID) | RESPIRATORY_TRACT | Status: DC
Start: 2021-08-25 — End: 2021-08-27
  Administered 2021-08-25 – 2021-08-27 (×6): 1 via RESPIRATORY_TRACT

## 2021-08-25 MED ORDER — PANTOPRAZOLE SODIUM 40 MG PO TBEC
80.0000 mg | DELAYED_RELEASE_TABLET | Freq: Two times a day (BID) | ORAL | Status: DC
  Administered 2021-08-25 – 2021-08-27 (×4): 80 mg via ORAL
  Filled 2021-08-25 (×6): qty 2

## 2021-08-25 MED ORDER — PREDNISONE 20 MG PO TABS: 50.0000 mg | ORAL_TABLET | Freq: Every day | ORAL | Status: DC

## 2021-08-25 MED ORDER — COLESTIPOL HCL 1 G PO TABS
2.0000 g | ORAL_TABLET | Freq: Two times a day (BID) | ORAL | Status: DC
  Administered 2021-08-26 – 2021-08-27 (×3): 2 g via ORAL
  Filled 2021-08-25 (×8): qty 2

## 2021-08-25 NOTE — Assessment & Plan Note (Addendum)
In setting of COVID-19 infection. Wheezing and hypoxia on admission. Chest x-ray without infectious process. Patient with improvement on steroids and Duonebs. Discharged to continue steroid taper. Prescription for Combivent and Duoneb provided.

## 2021-08-25 NOTE — Assessment & Plan Note (Addendum)
Patient previously vaccinated. No infiltrates on chest x-ray. Afebrile. Tmax of 99.5 F, no leukocytosis. Remdesivir initiated in the ED. Labs stable. Completed three days of Remdesivir. Continue steroids as mentioned above.

## 2021-08-25 NOTE — Assessment & Plan Note (Addendum)
-   Continue Flomax 

## 2021-08-25 NOTE — ED Triage Notes (Signed)
Pt arrived via EMS with complaints of weakness and SOB x3 months. Per EMS pt O2 89% placed on 3L and went up to 94%.

## 2021-08-25 NOTE — Assessment & Plan Note (Signed)
Noted  

## 2021-08-25 NOTE — Assessment & Plan Note (Addendum)
Secondary to COPD exacerbation. SpO2 of 88% on room air. Patient placed on 3 L/min in the ED. Patient's baseline SpO2 of about 90-92% per reports. PT recommending home health. Patient ambulated with pulse oximetry and required no oxygen to keep saturations above 88%. Resolved.

## 2021-08-25 NOTE — ED Notes (Signed)
Pt removing nasal cannula. This nurse educated pt on keeping nasal cannula in place. Pt stated that he will keep the oxygen on.
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
## 2021-08-25 NOTE — ED Notes (Signed)
Upon assessment pt removed nasal cannula. This nurse educated pt again on the importance of wearing oxygen. Pt O2 sats 88%, placed on 3.5L [place] 94%.
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
## 2021-08-25 NOTE — Assessment & Plan Note (Signed)
Patient is on metoprolol and Repatha as an outpatient. History of CABG. Follows with cardiology. -Continue metoprolol

## 2021-08-25 NOTE — Assessment & Plan Note (Addendum)
Continue Protonix °

## 2021-08-25 NOTE — ED Notes (Signed)
EDP at bedside updating wife per request. RT at bedside starting nebulizer.

## 2021-08-25 NOTE — ED Notes (Signed)
Upon assessment pt 90% on RA. Pt placed on 3 L [place]. O2 96%.

## 2021-08-25 NOTE — Plan of Care (Signed)

## 2021-08-26 ENCOUNTER — Observation Stay (HOSPITAL_COMMUNITY): Payer: Medicare Other

## 2021-08-26 DIAGNOSIS — Z951 Presence of aortocoronary bypass graft: Secondary | ICD-10-CM | POA: Diagnosis not present

## 2021-08-26 DIAGNOSIS — U071 COVID-19: Principal | ICD-10-CM

## 2021-08-26 DIAGNOSIS — R109 Unspecified abdominal pain: Secondary | ICD-10-CM | POA: Diagnosis not present

## 2021-08-26 DIAGNOSIS — J40 Bronchitis, not specified as acute or chronic: Secondary | ICD-10-CM | POA: Diagnosis present

## 2021-08-26 DIAGNOSIS — J441 Chronic obstructive pulmonary disease with (acute) exacerbation: Secondary | ICD-10-CM | POA: Diagnosis present

## 2021-08-26 DIAGNOSIS — Z79899 Other long term (current) drug therapy: Secondary | ICD-10-CM | POA: Diagnosis not present

## 2021-08-26 DIAGNOSIS — I2510 Atherosclerotic heart disease of native coronary artery without angina pectoris: Secondary | ICD-10-CM | POA: Diagnosis present

## 2021-08-26 DIAGNOSIS — J9601 Acute respiratory failure with hypoxia: Secondary | ICD-10-CM

## 2021-08-26 DIAGNOSIS — Z8249 Family history of ischemic heart disease and other diseases of the circulatory system: Secondary | ICD-10-CM | POA: Diagnosis not present

## 2021-08-26 DIAGNOSIS — N400 Enlarged prostate without lower urinary tract symptoms: Secondary | ICD-10-CM | POA: Diagnosis present

## 2021-08-26 DIAGNOSIS — Z9049 Acquired absence of other specified parts of digestive tract: Secondary | ICD-10-CM | POA: Diagnosis not present

## 2021-08-26 DIAGNOSIS — K219 Gastro-esophageal reflux disease without esophagitis: Secondary | ICD-10-CM | POA: Diagnosis present

## 2021-08-26 DIAGNOSIS — R140 Abdominal distension (gaseous): Secondary | ICD-10-CM | POA: Diagnosis present

## 2021-08-26 DIAGNOSIS — Z87891 Personal history of nicotine dependence: Secondary | ICD-10-CM | POA: Diagnosis not present

## 2021-08-26 DIAGNOSIS — Z961 Presence of intraocular lens: Secondary | ICD-10-CM | POA: Diagnosis present

## 2021-08-26 DIAGNOSIS — Z91041 Radiographic dye allergy status: Secondary | ICD-10-CM | POA: Diagnosis not present

## 2021-08-26 DIAGNOSIS — Z9841 Cataract extraction status, right eye: Secondary | ICD-10-CM | POA: Diagnosis not present

## 2021-08-26 DIAGNOSIS — Z888 Allergy status to other drugs, medicaments and biological substances status: Secondary | ICD-10-CM | POA: Diagnosis not present

## 2021-08-26 DIAGNOSIS — W010XXA Fall on same level from slipping, tripping and stumbling without subsequent striking against object, initial encounter: Secondary | ICD-10-CM | POA: Diagnosis present

## 2021-08-26 DIAGNOSIS — Z86010 Personal history of colonic polyps: Secondary | ICD-10-CM | POA: Diagnosis not present

## 2021-08-26 DIAGNOSIS — Z85828 Personal history of other malignant neoplasm of skin: Secondary | ICD-10-CM | POA: Diagnosis not present

## 2021-08-26 DIAGNOSIS — Z9842 Cataract extraction status, left eye: Secondary | ICD-10-CM | POA: Diagnosis not present

## 2021-08-26 DIAGNOSIS — Z91048 Other nonmedicinal substance allergy status: Secondary | ICD-10-CM | POA: Diagnosis not present

## 2021-08-26 LAB — COMPREHENSIVE METABOLIC PANEL
ALT: 46 U/L — ABNORMAL HIGH (ref 0–44)
AST: 38 U/L (ref 15–41)
Albumin: 3.7 g/dL (ref 3.5–5.0)
Alkaline Phosphatase: 48 U/L (ref 38–126)
Anion gap: 8 (ref 5–15)
BUN: 21 mg/dL (ref 8–23)
CO2: 21 mmol/L — ABNORMAL LOW (ref 22–32)
Calcium: 8.5 mg/dL — ABNORMAL LOW (ref 8.9–10.3)
Chloride: 107 mmol/L (ref 98–111)
Creatinine, Ser: 1 mg/dL (ref 0.61–1.24)
GFR, Estimated: >60 mL/min (ref >60)
Glucose, Bld: 147 mg/dL — ABNORMAL HIGH (ref 70–99)
Potassium: 3.8 mmol/L (ref 3.5–5.1)
Sodium: 136 mmol/L (ref 135–145)
Total Bilirubin: 0.9 mg/dL (ref 0.3–1.2)
Total Protein: 6.7 g/dL (ref 6.5–8.1)

## 2021-08-26 LAB — CBC WITH DIFFERENTIAL/PLATELET
Abs Immature Granulocytes: 0.01 10*3/uL (ref 0.00–0.07)
Basophils Absolute: 0 10*3/uL (ref 0.0–0.1)
Basophils Relative: 0 %
Eosinophils Absolute: 0 10*3/uL (ref 0.0–0.5)
Eosinophils Relative: 0 %
HCT: 47 % (ref 39.0–52.0)
Hemoglobin: 15.7 g/dL (ref 13.0–17.0)
Immature Granulocytes: 0 %
Lymphocytes Relative: 22 %
Lymphs Abs: 1.1 10*3/uL (ref 0.7–4.0)
MCH: 30.7 pg (ref 26.0–34.0)
MCHC: 33.4 g/dL (ref 30.0–36.0)
MCV: 92 fL (ref 80.0–100.0)
Monocytes Absolute: 0.6 10*3/uL (ref 0.1–1.0)
Monocytes Relative: 12 %
Neutro Abs: 3.3 10*3/uL (ref 1.7–7.7)
Neutrophils Relative %: 66 %
Platelets: 219 10*3/uL (ref 150–400)
RBC: 5.11 MIL/uL (ref 4.22–5.81)
RDW: 13.5 % (ref 11.5–15.5)
WBC: 4.9 10*3/uL (ref 4.0–10.5)
nRBC: 0 % (ref 0.0–0.2)

## 2021-08-26 LAB — FERRITIN: Ferritin: 76 ng/mL (ref 24–336)

## 2021-08-26 LAB — C-REACTIVE PROTEIN: CRP: 1.3 mg/dL — ABNORMAL HIGH (ref <1.0)

## 2021-08-26 LAB — D-DIMER, QUANTITATIVE: D-Dimer, Quant: 0.42 ug/mL-FEU (ref 0.00–0.50)

## 2021-08-26 MED ORDER — BISACODYL 10 MG RE SUPP
10.0000 mg | Freq: Once | RECTAL | Status: DC
  Filled 2021-08-26: qty 1

## 2021-08-26 MED ORDER — POLYETHYLENE GLYCOL 3350 17 G PO PACK
17.0000 g | PACK | Freq: Every day | ORAL | Status: DC
  Filled 2021-08-26 (×2): qty 1

## 2021-08-26 NOTE — Progress Notes (Signed)
PROGRESS NOTE    Benjamin [name]  TIW:[id number] DOB: 01/02/1941 DOA: 08/25/2021 PCP: Noreene Larsson, NP   Brief Narrative: Benjamin [name] is a 80 y.o. male with a history of GERD, COPD, CAD s/p CABG, BPH. Patient presented secondary to falling and inability to get up secondary to significant weakness. On admission he was found to have a COPD exacerbation, mild respiratory failure and a COVID-19 infection. Patient started on Remdesivir, steroids and nebulizer treatments. PT/OT consulted for evaluation.   Assessment & Plan:   * COPD exacerbation ([place]) In setting of COVID-19 infection. Wheezing and hypoxia on admission. Chest x-ray without infectious process. -Continue Duonebs -Continue Solu-medrol > prednisone -Patient requesting nebulizer solution on discharge  Abdominal distension Possibly constipation. Patient with decreased flatus. No nausea/vomiting. Last bowel movement was yesterday per his report. -Abdominal x-ray -If reassuring x-ray, will start scheduled bowel regimen  COVID-19 virus infection Patient previously vaccinated. No infiltrates on chest x-ray. Afebrile. Tmax of 99.5 F, no leukocytosis. Remdesivir initiated in the ED. Labs stable. -Continue Remdesivir x3 days -Continue Steroids  S/P CABG (coronary artery bypass graft) Noted.  Coronary artery disease Patient is on metoprolol and Repatha as an outpatient. History of CABG. Follows with cardiology. -Continue metoprolol  Acute respiratory failure with hypoxia ([place]) Secondary to COPD exacerbation. SpO2 of 88% on room air. Patient placed on 3 L/min in the ED. Patient's baseline SpO2 of about 90-92% per reports. PT recommending home health. -Oxygen therapy, goal SpO2 >90% -Ambulatory pulse oximetry with documentation  GERD (gastroesophageal reflux disease) -Continue Protonix  BPH (benign prostatic hyperplasia) -Continue Flomax    DVT prophylaxis: Lovenox Code Status:   Code Status: Full Code Family Communication:  None at bedside Disposition Plan: Discharge home likely in 24 hours pending completion of Remdesivir, OT recommendations   Consultants:  None  Procedures:  None  Antimicrobials: Remdesivir IV    Subjective: Breathing is towards baseline. Patient with significant weakness per his report. Cough is present but productivity is improved.  Objective: Vitals:   08/26/21 0407 08/26/21 0825 08/26/21 0900 08/26/21 1230  BP: 112/80  120/70   Pulse: 68  86   Resp: 18     Temp: 97.9 F (36.6 C)     TempSrc: Oral     SpO2:  (!) 88% 91% 90%  Weight:      Height:        Intake/Output Summary (Last 24 hours) at 08/26/2021 1311 Last data filed at 08/26/2021 0200 Gross per 24 hour  Intake 580 ml  Output --  Net 580 ml   Filed Weights   08/25/21 0913 08/25/21 1600  Weight: 90 kg 86 kg    Examination:  General exam: Appears calm and comfortable Respiratory system: Faint rales, no wheezing. Respiratory effort normal. Cardiovascular system: S1 & S2 heard, RRR. No murmurs, rubs, gallops or clicks. Gastrointestinal system: Abdomen is distended, soft and with periumbilical tenderness. No organomegaly or masses felt. Normal bowel sounds heard. Central nervous system: Alert and oriented. No focal neurological deficits. Musculoskeletal: No edema. No calf tenderness Skin: No cyanosis. No rashes Psychiatry: Judgement and insight appear normal. Mood & affect appropriate.     Data Reviewed: I have personally reviewed following labs and imaging studies  CBC Lab Results  Component Value Date   WBC 4.9 08/26/2021   RBC 5.11 08/26/2021   HGB 15.7 08/26/2021   HCT 47.0 08/26/2021   MCV 92.0 08/26/2021   MCH 30.7 08/26/2021   PLT 219 08/26/2021   MCHC 33.4 08/26/2021  RDW 13.5 08/26/2021   LYMPHSABS 1.1 08/26/2021   MONOABS 0.6 08/26/2021   EOSABS 0.0 08/26/2021   BASOSABS 0.0 99/24/2683     Last metabolic panel Lab Results  Component Value Date   NA 136 08/26/2021   K 3.8  08/26/2021   CL 107 08/26/2021   CO2 21 (L) 08/26/2021   BUN 21 08/26/2021   CREATININE 1.00 08/26/2021   GLUCOSE 147 (H) 08/26/2021   GFRNONAA >60 08/26/2021   GFRAA 66 09/05/2020   CALCIUM 8.5 (L) 08/26/2021   PHOS 2.0 (L) 05/08/2017   PROT 6.7 08/26/2021   ALBUMIN 3.7 08/26/2021   LABGLOB 2.0 07/23/2021   AGRATIO 2.2 07/23/2021   BILITOT 0.9 08/26/2021   ALKPHOS 48 08/26/2021   AST 38 08/26/2021   ALT 46 (H) 08/26/2021   ANIONGAP 8 08/26/2021    CBG (last 3)  No results for input(s): GLUCAP in the last 72 hours.   GFR: Estimated Creatinine Clearance: 63.8 mL/min (by C-G formula based on SCr of 1 mg/dL).  Coagulation Profile: No results for input(s): INR, PROTIME in the last 168 hours.  Recent Results (from the past 240 hour(s))  Resp Panel by RT-PCR (Flu A&B, Covid) Nasopharyngeal Swab     Status: Abnormal   Collection Time: 08/25/21  9:31 AM   Specimen: Nasopharyngeal Swab; Nasopharyngeal(NP) swabs in vial transport medium  Result Value Ref Range Status   SARS Coronavirus 2 by RT PCR POSITIVE (A) NEGATIVE Final    Comment: CRITICAL RESULT CALLED TO, READ BACK BY AND VERIFIED WITH:  A.MCNEIL @ 1132 08/25/21 BY STEPHTR (NOTE) SARS-CoV-2 target nucleic acids are DETECTED.  The SARS-CoV-2 RNA is generally detectable in upper respiratory specimens during the acute phase of infection. Positive results are indicative of the presence of the identified virus, but do not rule out bacterial infection or co-infection with other pathogens not detected by the test. Clinical correlation with patient history and other diagnostic information is necessary to determine patient infection status. The expected result is Negative.  Fact Sheet for Patients: EntrepreneurPulse.com.au  Fact Sheet for Healthcare Providers: IncredibleEmployment.be  This test is not yet approved or cleared by the [place] FDA and  has been authorized for detection  and/or diagnosis of SARS-CoV-2 by FDA under an Emergency Use Authorization (EUA).  This EUA will remain in effect (meaning this  test can be used) for the duration of  the COVID-19 declaration under Section 564(b)(1) of the Act, 21 U.S.C. section 360bbb-3(b)(1), unless the authorization is terminated or revoked sooner.     Influenza A by PCR NEGATIVE NEGATIVE Final   Influenza B by PCR NEGATIVE NEGATIVE Final    Comment: (NOTE) The Xpert Xpress SARS-CoV-2/FLU/RSV plus assay is intended as an aid in the diagnosis of influenza from Nasopharyngeal swab specimens and should not be used as a sole basis for treatment. Nasal washings and aspirates are unacceptable for Xpert Xpress SARS-CoV-2/FLU/RSV testing.  Fact Sheet for Patients: EntrepreneurPulse.com.au  Fact Sheet for Healthcare Providers: IncredibleEmployment.be  This test is not yet approved or cleared by the [place] FDA and has been authorized for detection and/or diagnosis of SARS-CoV-2 by FDA under an Emergency Use Authorization (EUA). This EUA will remain in effect (meaning this test can be used) for the duration of the COVID-19 declaration under Section 564(b)(1) of the Act, 21 U.S.C. section 360bbb-3(b)(1), unless the authorization is terminated or revoked.  Performed at [facility], [address], [place], [place] [address]  Radiology Studies: DG Chest Port 1 View  Result Date: 08/25/2021 CLINICAL DATA:  Shortness of breath, cough. EXAM: PORTABLE CHEST 1 VIEW COMPARISON:  December 18, 2020. FINDINGS: Stable cardiomediastinal silhouette. Minimal bibasilar subsegmental atelectasis is noted. Bony thorax is unremarkable. Hypoinflation of the lungs is noted. Status post coronary bypass graft. Bony thorax is unremarkable. IMPRESSION: Hypoinflation of the lungs with minimal bibasilar subsegmental atelectasis. Electronically Signed   By: Marijo Conception M.D.   On: 08/25/2021 09:58         Scheduled Meds:  colestipol  2 g Oral BID   enoxaparin (LOVENOX) injection  40 mg Subcutaneous Q24H   Ipratropium-Albuterol  1 puff Inhalation QID   mouth rinse  15 mL Mouth Rinse BID   methylPREDNISolone (SOLU-MEDROL) injection  0.5 mg/kg Intravenous Q12H   Followed by   Derrill Memo ON 08/28/2021] predniSONE  50 mg Oral Daily   metoprolol tartrate  12.5 mg Oral BID   pantoprazole  80 mg Oral BID   tamsulosin  0.8 mg Oral Daily   Continuous Infusions:  remdesivir 100 mg in NS 100 mL 100 mg (08/26/21 0935)     LOS: 0 days     Cordelia Poche, MD [facility] 08/26/2021, 1:11 PM  If 7PM-7AM, please contact night-coverage [email or web address]

## 2021-08-26 NOTE — Evaluation (Signed)
Physical Therapy Evaluation Patient Details Name: Burt Piatek MRN: [id number] DOB: 1940/11/27 Today's Date: 08/26/2021  History of Present Illness  Darsh Vandevoort is a 80 y.o. male with medical history significant of GERD, COPD, CAD s/p CABG, BPH. Patient reports falling today. He was getting out of his chair, leaning against his walker when the walker slipped from in front of him and he fell. He reports no energy and no stamina to attempt getting back up. He was eventually able to get up with the help of his wife and they called 911 for transport to the ED for evaluation. He has had poor appetite for the last two days. He reports weakness/low energy for the last three months. No worsening. He does report some acute on chronic dyspnea over the last three month with worsening over the last few days. He reports a productive cough from post-nasal drip, but production has been present only for the last week.   Clinical Impression  Patient demonstrates good return for sitting up at bedside with [facility] raised, slow labored movement for completing sit to stands and ambulation in room without loss of balance, limited mostly due to c/o generalized weakness and fatigue.  Patient on room air during ambulation with SpO2 dropping from 92% to 88%, but recovered to 93% while seated at EOB - nursing staff notified.  Patient will benefit from continued skilled physical therapy in hospital and recommended venue below to increase strength, balance, endurance for safe ADLs and gait.        Recommendations for follow up therapy are one component of a multi-disciplinary discharge planning process, led by the attending physician.  Recommendations may be updated based on patient status, additional functional criteria and insurance authorization.  Follow Up Recommendations Home health PT    Assistance Recommended at Discharge PRN  Functional Status Assessment Patient has had a recent decline in their functional status and  demonstrates the ability to make significant improvements in function in a reasonable and predictable amount of time.  Equipment Recommendations  None recommended by PT    Recommendations for Other Services       Precautions / Restrictions Precautions Precautions: Fall Restrictions Weight Bearing Restrictions: No      Mobility  Bed Mobility Overal bed mobility: Modified Independent             General bed mobility comments: HOB raised    Transfers Overall transfer level: Needs assistance Equipment used: Rolling walker (2 wheels) Transfers: Sit to/from Stand;Bed to chair/wheelchair/BSC Sit to Stand: Supervision   Step pivot transfers: Supervision       General transfer comment: increased time, labored movement    Ambulation/Gait Ambulation/Gait assistance: Min guard Gait Distance (Feet): 20 Feet Assistive device: Rolling walker (2 wheels) Gait Pattern/deviations: Decreased step length - right;Decreased step length - left;Decreased stride length Gait velocity: decreased     General Gait Details: slow labored cadence without loss of balance, limited mostly due to c/o generalized weakness/fatigue on room air with SpO2 dropping from 92 to 88%  Stairs            Wheelchair Mobility    Modified Rankin (Stroke Patients Only)       Balance Overall balance assessment: Needs assistance Sitting-balance support: Feet supported;No upper extremity supported Sitting balance-Leahy Scale: Good Sitting balance - Comments: seated at EOB   Standing balance support: Reliant on assistive device for balance;During functional activity;Bilateral upper extremity supported Standing balance-Leahy Scale: Fair Standing balance comment: fair/good using RW  Pertinent Vitals/Pain Pain Assessment: No/denies pain    Home Living Family/patient expects to be discharged to:: Private residence Living Arrangements: Spouse/significant  other Available Help at Discharge: Family;Available 24 hours/day Type of Home: House Home Access: Stairs to enter Entrance Stairs-Rails: Right;Left;Can reach both Entrance Stairs-Number of Steps: 4-5   Home Layout: One level Home Equipment: Conservation officer, nature (2 wheels);Cane - single point;Shower seat;BSC/3in1      Prior Function Prior Level of Function : Independent/Modified Independent             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, drives ADLs Comments: Independent     Hand Dominance   Dominant Hand: Right    Extremity/Trunk Assessment   Upper Extremity Assessment Upper Extremity Assessment: Overall WFL for tasks assessed    Lower Extremity Assessment Lower Extremity Assessment: Generalized weakness    Cervical / Trunk Assessment Cervical / Trunk Assessment: Normal  Communication   Communication: No difficulties  Cognition Arousal/Alertness: Awake/alert Behavior During Therapy: WFL for tasks assessed/performed Overall Cognitive Status: Within Functional Limits for tasks assessed                                          General Comments      Exercises     Assessment/Plan    PT Assessment Patient needs continued PT services  PT Problem List Decreased strength;Decreased activity tolerance;Decreased balance;Decreased mobility       PT Treatment Interventions DME instruction;Gait training;Stair training;Functional mobility training;Therapeutic activities;Therapeutic exercise;Patient/family education;Balance training    PT Goals (Current goals can be found in the Care Plan section)  Acute Rehab PT Goals Patient Stated Goal: return home with family to assist PT Goal Formulation: With patient Time For Goal Achievement: 08/31/21 Potential to Achieve Goals: Good    Frequency Min 3X/week   Barriers to discharge        Co-evaluation               AM-PAC PT "6 Clicks" Mobility  Outcome Measure Help needed  turning from your back to your side while in a flat bed without using bedrails?: None Help needed moving from lying on your back to sitting on the side of a flat bed without using bedrails?: A Little Help needed moving to and from a bed to a chair (including a wheelchair)?: A Little Help needed standing up from a chair using your arms (e.g., wheelchair or bedside chair)?: A Little Help needed to walk in hospital room?: A Little Help needed climbing 3-5 steps with a railing? : A Lot 6 Click Score: 18    End of Session   Activity Tolerance: Patient tolerated treatment well;Patient limited by fatigue Patient left: in bed;with call bell/phone within reach Nurse Communication: Mobility status PT Visit Diagnosis: Unsteadiness on feet (R26.81);Other abnormalities of gait and mobility (R26.89);Muscle weakness (generalized) (M62.81)    Time: 3662-9476 PT Time Calculation (min) (ACUTE ONLY): 30 min   Charges:   PT Evaluation $PT Eval Moderate Complexity: 1 Mod PT Treatments $Therapeutic Activity: 23-37 mins        11:28 AM, 08/26/21 Lonell Grandchild, MPT Physical Therapist with [facility] [phone] office [phone] mobile phone

## 2021-08-26 NOTE — Progress Notes (Signed)
Phlebotomy was able to get only one tube of blood before patient jerked and said, "no more." He is refusing another stick for labs at this time, but stated he would be willing later in the day, "after I get some sleep" Phlebotomy is aware and rescheduled labs to be drawn at 9 am.

## 2021-08-26 NOTE — Plan of Care (Signed)
  Problem: Acute Rehab PT Goals(only PT should resolve) Goal: Pt Will Go Supine/Side To Sit Outcome: Progressing Flowsheets (Taken 08/26/2021 1129) Pt will go Supine/Side to Sit:  Independently  with modified independence Goal: Patient Will Transfer Sit To/From Stand Outcome: Progressing Flowsheets (Taken 08/26/2021 1129) Patient will transfer sit to/from stand: with modified independence Goal: Pt Will Transfer Bed To Chair/Chair To Bed Outcome: Progressing Flowsheets (Taken 08/26/2021 1129) Pt will Transfer Bed to Chair/Chair to Bed: with modified independence Goal: Pt Will Ambulate Outcome: Progressing Flowsheets (Taken 08/26/2021 1129) Pt will Ambulate:  50 feet  with modified independence  with supervision  with rolling walker   11:29 AM, 08/26/21 Lonell Grandchild, MPT Physical Therapist with [facility] [phone] office [phone] mobile phone

## 2021-08-26 NOTE — Hospital Course (Signed)
Benjamin [name] is a 80 y.o. male with a history of GERD, COPD, CAD s/p CABG, BPH. Patient presented secondary to falling and inability to get up secondary to significant weakness. On admission he was found to have a COPD exacerbation, mild respiratory failure and a COVID-19 infection. Patient started on Remdesivir, steroids and nebulizer treatments. PT/OT consulted for evaluation.

## 2021-08-26 NOTE — Assessment & Plan Note (Addendum)
Possibly constipation. Patient with decreased flatus. No nausea/vomiting. Last bowel movement was yesterday per his report. Symptoms improved with bowel movement. Abdominal x-ray without obstructive disease.

## 2021-08-27 ENCOUNTER — Ambulatory Visit: Payer: Medicare Other | Admitting: Nurse Practitioner

## 2021-08-27 LAB — CBC WITH DIFFERENTIAL/PLATELET
Abs Immature Granulocytes: 0.03 K/uL (ref 0.00–0.07)
Basophils Absolute: 0 K/uL (ref 0.0–0.1)
Basophils Relative: 0 %
Eosinophils Absolute: 0 K/uL (ref 0.0–0.5)
Eosinophils Relative: 0 %
HCT: 46.2 % (ref 39.0–52.0)
Hemoglobin: 15.5 g/dL (ref 13.0–17.0)
Immature Granulocytes: 1 %
Lymphocytes Relative: 14 %
Lymphs Abs: 0.7 K/uL (ref 0.7–4.0)
MCH: 31.4 pg (ref 26.0–34.0)
MCHC: 33.5 g/dL (ref 30.0–36.0)
MCV: 93.5 fL (ref 80.0–100.0)
Monocytes Absolute: 0.2 K/uL (ref 0.1–1.0)
Monocytes Relative: 5 %
Neutro Abs: 3.8 K/uL (ref 1.7–7.7)
Neutrophils Relative %: 80 %
Platelets: 229 K/uL (ref 150–400)
RBC: 4.94 MIL/uL (ref 4.22–5.81)
RDW: 13.7 % (ref 11.5–15.5)
WBC: 4.7 K/uL (ref 4.0–10.5)
nRBC: 0 % (ref 0.0–0.2)

## 2021-08-27 LAB — COMPREHENSIVE METABOLIC PANEL
ALT: 83 U/L — ABNORMAL HIGH (ref 0–44)
AST: 78 U/L — ABNORMAL HIGH (ref 15–41)
Albumin: 3.6 g/dL (ref 3.5–5.0)
Alkaline Phosphatase: 47 U/L (ref 38–126)
Anion gap: 7 (ref 5–15)
BUN: 24 mg/dL — ABNORMAL HIGH (ref 8–23)
CO2: 23 mmol/L (ref 22–32)
Calcium: 8.3 mg/dL — ABNORMAL LOW (ref 8.9–10.3)
Chloride: 106 mmol/L (ref 98–111)
Creatinine, Ser: 1.09 mg/dL (ref 0.61–1.24)
GFR, Estimated: >60 mL/min (ref >60)
Glucose, Bld: 189 mg/dL — ABNORMAL HIGH (ref 70–99)
Potassium: 4.3 mmol/L (ref 3.5–5.1)
Sodium: 136 mmol/L (ref 135–145)
Total Bilirubin: 0.8 mg/dL (ref 0.3–1.2)
Total Protein: 6.7 g/dL (ref 6.5–8.1)

## 2021-08-27 LAB — C-REACTIVE PROTEIN: CRP: 0.8 mg/dL (ref <1.0)

## 2021-08-27 LAB — D-DIMER, QUANTITATIVE: D-Dimer, Quant: 0.35 ug/mL-FEU (ref 0.00–0.50)

## 2021-08-27 LAB — FERRITIN: Ferritin: 80 ng/mL (ref 24–336)

## 2021-08-27 MED ORDER — IPRATROPIUM-ALBUTEROL 20-100 MCG/ACT IN AERS
1.0000 | INHALATION_SPRAY | RESPIRATORY_TRACT | 2 refills | Status: DC | PRN
Start: 2021-08-27 — End: 2021-10-30

## 2021-08-27 MED ORDER — IPRATROPIUM-ALBUTEROL 20-100 MCG/ACT IN AERS: 1.0000 | INHALATION_SPRAY | Freq: Two times a day (BID) | RESPIRATORY_TRACT | Status: DC

## 2021-08-27 MED ORDER — PREDNISONE 10 MG PO TABS: ORAL_TABLET | ORAL | 0 refills | Status: AC

## 2021-08-27 MED ORDER — IPRATROPIUM-ALBUTEROL 0.5-2.5 (3) MG/3ML IN SOLN: 3.0000 mL | RESPIRATORY_TRACT | 2 refills | Status: DC | PRN

## 2021-08-27 NOTE — TOC Transition Note (Addendum)
Transition of Care [facility]) - CM/SW Discharge Note   Patient Details  Name: Benjamin [name] MRN: [id number] Date of Birth: 12/07/1940  [facility]) CM/SW Contact:  Iona Beard, [place] Phone Number: 08/27/2021, 11:16 AM   Clinical Narrative:    CSW spoke with pt to complete assessment. Pt lives with his wife. Pt has a cane and walker to use in the home when needed. CSW spoke with pt about PT recommending [place] services at D/C. Pt states that he feels that he needs this and is agreeable to [facility] PT. PT does not have a preference in company. CSW to send out referral. Tommi Rumps with [facility] accepted pts referral and made aware of pts d/c today.   Final next level of care: [place] Barriers to Discharge: Barriers Resolved   Patient Goals and CMS Choice Patient states their goals for this hospitalization and ongoing recovery are:: Home with [facility] CMS Medicare.gov Compare Post Acute Care list provided to:: Patient Choice offered to / list presented to : Patient  Discharge Placement                       Discharge Plan and Services In-house Referral: Clinical Social Work Discharge Planning Services: CM Consult Post Acute Care Choice: Home Health                    HH Arranged: PT [facility] Agency: [place] [facility]        Social Determinants of Health (SDOH) Interventions     Readmission Risk Interventions Readmission Risk Prevention Plan 08/27/2021  Medication Screening Complete  Transportation Screening Complete  Some recent data might be hidden

## 2021-08-27 NOTE — Discharge Instructions (Signed)
Benjamin [name],  You are in the hospital with a COPD exacerbation which was likely triggered by your COVID-19 infection.  Thankfully you do not have any evidence of pneumonia.  You received 3 days of remdesivir and was started on steroid therapy.  You have improved from admission.  I have sent prescriptions for steroids, nebulizer fluid and rescue inhaler for you to use as an outpatient.  Please follow-up with your primary care.  Please ensure that you isolate for at least 5 days followed by another 5 days of mask wearing if you are not isolating.

## 2021-08-27 NOTE — Progress Notes (Signed)
OT Screen Note  Patient Details Name: Benjamin [name] MRN: [id number] DOB: 12/15/1940   Cancelled Treatment:    Reason Eval/Treat Not Completed: OT screened, no needs identified, will sign off. Met with patient this morning. He is extremely eager to return home and take care of his wife whom is home sick with COVID. He reports no difficultly completing any ADL tasks. He uses a cane or walker as needed as he states his balance is a little off but with the use of an assistive device he manages well. No follow up OT services are recommended at this time. Thank you for the referral.   Ailene Ravel, OTR/L,CBIS  [phone]  08/27/2021, 9:18 AM

## 2021-08-27 NOTE — Discharge Summary (Signed)
Physician Discharge Summary  Benjamin [name] NWG:[id number] DOB: July 26, 1941 DOA: 08/25/2021  PCP: Noreene Larsson, NP  Admit date: 08/25/2021 Discharge date: 08/27/2021  Admitted From: Home Disposition: Home  Recommendations for Outpatient Follow-up:  Follow up with PCP in 1 week Please follow up on the following pending results: None  Home Health: PT Equipment/Devices: None  Discharge Condition: Stable CODE STATUS: Full code Diet recommendation: Heart healthy diet   Brief/Interim Summary:  HPI: Benjamin [name] is a 80 y.o. male with medical history significant of GERD, COPD, CAD s/p CABG, BPH. Patient reports falling today. He was getting out of his chair, leaning against his walker when the walker slipped from in front of him and he fell. He reports no energy and no stamina to attempt getting back up. He was eventually able to get up with the help of his wife and they called 911 for transport to the ED for evaluation. He has had poor appetite for the last two days. He reports weakness/low energy for the last three months. No worsening. He does report some acute on chronic dyspnea over the last three month with worsening over the last few days. He reports a productive cough from post-nasal drip, but production has been present only for the last week.    Hospital course:  * COPD exacerbation ([facility]) In setting of COVID-19 infection. Wheezing and hypoxia on admission. Chest x-ray without infectious process. Patient with improvement on steroids and Duonebs. Discharged to continue steroid taper. Prescription for Combivent and Duoneb provided.  Abdominal distension Possibly constipation. Patient with decreased flatus. No nausea/vomiting. Last bowel movement was yesterday per his report. Symptoms improved with bowel movement. Abdominal x-ray without obstructive disease.  COVID-19 virus infection Patient previously vaccinated. No infiltrates on chest x-ray. Afebrile. Tmax of 99.5 F, no  leukocytosis. Remdesivir initiated in the ED. Labs stable. Completed three days of Remdesivir. Continue steroids as mentioned above.  S/P CABG (coronary artery bypass graft) Noted.  Coronary artery disease Patient is on metoprolol and Repatha as an outpatient. History of CABG. Follows with cardiology. -Continue metoprolol  GERD (gastroesophageal reflux disease) Continue Protonix  BPH (benign prostatic hyperplasia) Continue Flomax  Acute respiratory failure with hypoxia (HCC)-resolved as of 08/27/2021 Secondary to COPD exacerbation. SpO2 of 88% on room air. Patient placed on 3 L/min in the ED. Patient's baseline SpO2 of about 90-92% per reports. PT recommending home health. Patient ambulated with pulse oximetry and required no oxygen to keep saturations above 88%. Resolved.   Discharge Diagnoses:  Principal Problem:   COPD exacerbation ([place]) Active Problems:   BPH (benign prostatic hyperplasia)   GERD (gastroesophageal reflux disease)   Acute respiratory failure with hypoxia (HCC)   Coronary artery disease   S/P CABG (coronary artery bypass graft)   COVID-19 virus infection   Abdominal distension    Discharge Instructions   Allergies as of 08/27/2021       Reactions   Arsenic Swelling   Severe swelling if patient comes in contact    Contrast Media [iodinated Diagnostic Agents] Swelling, Rash   Statins Rash   Joint pain        Medication List     STOP taking these medications    gabapentin 300 MG capsule Commonly known as: NEURONTIN       TAKE these medications    colestipol 1 g tablet Commonly known as: COLESTID Take 2 g by mouth 2 (two) times daily.   CRANBERRY EXTRACT PO Take 400 mg by mouth 2 (two)  times daily.   HYDROcodone-acetaminophen 5-325 MG tablet Commonly known as: Norco Take 1 tablet by mouth every 6 (six) hours as needed for moderate pain.   Ipratropium-Albuterol 20-100 MCG/ACT Aers respimat Commonly known as: COMBIVENT Inhale 1  puff into the lungs every 4 (four) hours as needed for wheezing or shortness of breath.   ipratropium-albuterol 0.5-2.5 (3) MG/3ML Soln Commonly known as: DUONEB Take 3 mLs by nebulization every 4 (four) hours as needed.   metoprolol tartrate 25 MG tablet Commonly known as: LOPRESSOR Take 0.5 tablets (12.5 mg total) by mouth 2 (two) times daily.   pantoprazole 40 MG tablet Commonly known as: PROTONIX Take 80 mg by mouth 2 (two) times daily.   predniSONE 10 MG tablet Commonly known as: DELTASONE Take 4 tablets (40 mg total) by mouth daily with breakfast for 2 days, THEN 2 tablets (20 mg total) daily with breakfast for 2 days, THEN 1 tablet (10 mg total) daily with breakfast for 3 days. Start taking on: December 12, 79   Repatha SureClick 856 MG/ML [place] Generic drug: Evolocumab INJECT 140MG  SUBCUTANEOUSLY EVERY 14 DAYS AS DIRECTED BY MD   tamsulosin 0.4 MG Caps capsule Commonly known as: FLOMAX TAKE 2 CAPSULES BY MOUTH ONCE DAILY AFTER SUPPER FOR PROSTATE What changed:  how much to take how to take this when to take this        Follow-up Information     Noreene Larsson, NP. Schedule an appointment as soon as possible for a visit in 1 week(s).   Specialty: Nurse Practitioner Contact information: [address]  [address] [place] [address] [phone]                Allergies  Allergen Reactions   Arsenic Swelling    Severe swelling if patient comes in contact    Contrast Media [Iodinated Diagnostic Agents] Swelling and Rash   Statins Rash    Joint pain    Consultations: None   Procedures/Studies: DG Chest Port 1 View  Result Date: 08/25/2021 CLINICAL DATA:  Shortness of breath, cough. EXAM: PORTABLE CHEST 1 VIEW COMPARISON:  December 18, 2020. FINDINGS: Stable cardiomediastinal silhouette. Minimal bibasilar subsegmental atelectasis is noted. Bony thorax is unremarkable. Hypoinflation of the lungs is noted. Status post coronary bypass graft.  Bony thorax is unremarkable. IMPRESSION: Hypoinflation of the lungs with minimal bibasilar subsegmental atelectasis. Electronically Signed   By: Marijo Conception M.D.   On: 08/25/2021 09:58   DG Abd Portable 1V  Result Date: 08/26/2021 CLINICAL DATA:  Abdominal distension and pain. EXAM: PORTABLE ABDOMEN - 1 VIEW COMPARISON:  None FINDINGS: No dilated loops of large or small bowel. Gas and stool in the rectum. No evidence of constipation. No pathologic calcifications. No organomegaly. No acute osseous abnormality IMPRESSION: No acute abdominal findings. Electronically Signed   By: Suzy Bouchard M.D.   On: 08/26/2021 13:59      Subjective: No dyspnea. Still with weakness.  Discharge Exam: Vitals:   08/27/21 0809 08/27/21 0900  BP:    Pulse:    Resp:    Temp:    SpO2: 93% 92%   Vitals:   08/26/21 2211 08/27/21 0640 08/27/21 0809 08/27/21 0900  BP: (!) 150/93 104/61    Pulse: (!) 55 81    Resp: 20 19    Temp: 97.7 F (36.5 C) 98.8 F (37.1 C)    TempSrc: Oral     SpO2: 93% 90% 93% 92%  Weight:      Height:  General: Pt is alert, awake, not in acute distress Cardiovascular: RRR, S1/S2 +, no rubs, no gallops Respiratory: CTA bilaterally, no wheezing, no rhonchi Abdominal: Soft, NT, ND, bowel sounds + Extremities: no edema, no cyanosis    The results of significant diagnostics from this hospitalization (including imaging, microbiology, ancillary and laboratory) are listed below for reference.     Microbiology: Recent Results (from the past 240 hour(s))  Resp Panel by RT-PCR (Flu A&B, Covid) Nasopharyngeal Swab     Status: Abnormal   Collection Time: 08/25/21  9:31 AM   Specimen: Nasopharyngeal Swab; Nasopharyngeal(NP) swabs in vial transport medium  Result Value Ref Range Status   SARS Coronavirus 2 by RT PCR POSITIVE (A) NEGATIVE Final    Comment: CRITICAL RESULT CALLED TO, READ BACK BY AND VERIFIED WITH:  A.MCNEIL @ 1132 08/25/21 BY  STEPHTR (NOTE) SARS-CoV-2 target nucleic acids are DETECTED.  The SARS-CoV-2 RNA is generally detectable in upper respiratory specimens during the acute phase of infection. Positive results are indicative of the presence of the identified virus, but do not rule out bacterial infection or co-infection with other pathogens not detected by the test. Clinical correlation with patient history and other diagnostic information is necessary to determine patient infection status. The expected result is Negative.  Fact Sheet for Patients: EntrepreneurPulse.com.au  Fact Sheet for Healthcare Providers: IncredibleEmployment.be  This test is not yet approved or cleared by the [place] FDA and  has been authorized for detection and/or diagnosis of SARS-CoV-2 by FDA under an Emergency Use Authorization (EUA).  This EUA will remain in effect (meaning this  test can be used) for the duration of  the COVID-19 declaration under Section 564(b)(1) of the Act, 21 U.S.C. section 360bbb-3(b)(1), unless the authorization is terminated or revoked sooner.     Influenza A by PCR NEGATIVE NEGATIVE Final   Influenza B by PCR NEGATIVE NEGATIVE Final    Comment: (NOTE) The Xpert Xpress SARS-CoV-2/FLU/RSV plus assay is intended as an aid in the diagnosis of influenza from Nasopharyngeal swab specimens and should not be used as a sole basis for treatment. Nasal washings and aspirates are unacceptable for Xpert Xpress SARS-CoV-2/FLU/RSV testing.  Fact Sheet for Patients: EntrepreneurPulse.com.au  Fact Sheet for Healthcare Providers: IncredibleEmployment.be  This test is not yet approved or cleared by the [place] FDA and has been authorized for detection and/or diagnosis of SARS-CoV-2 by FDA under an Emergency Use Authorization (EUA). This EUA will remain in effect (meaning this test can be used) for the duration of  the COVID-19 declaration under Section 564(b)(1) of the Act, 21 U.S.C. section 360bbb-3(b)(1), unless the authorization is terminated or revoked.  Performed at [facility], [address], [place], [place] [address]      Labs: BNP (last 3 results) Recent Labs    08/25/21 0931  BNP 28.7   Basic Metabolic Panel: Recent Labs  Lab 08/25/21 0931 08/26/21 0739 08/27/21 0501  NA 133* 136 136  K 4.0 3.8 4.3  CL 105 107 106  CO2 21* 21* 23  GLUCOSE 123* 147* 189*  BUN 21 21 24*  CREATININE 1.22 1.00 1.09  CALCIUM 8.1* 8.5* 8.3*   Liver Function Tests: Recent Labs  Lab 08/26/21 0739 08/27/21 0501  AST 38 78*  ALT 46* 83*  ALKPHOS 48 47  BILITOT 0.9 0.8  PROT 6.7 6.7  ALBUMIN 3.7 3.6   No results for input(s): LIPASE, AMYLASE in the last 168 hours. No results for input(s): AMMONIA in the last 168 hours.  CBC: Recent Labs  Lab 08/25/21 0931 08/26/21 0739 08/27/21 0501  WBC 4.6 4.9 4.7  NEUTROABS 3.2 3.3 3.8  HGB 15.2 15.7 15.5  HCT 45.0 47.0 46.2  MCV 93.6 92.0 93.5  PLT 195 219 229   Cardiac Enzymes: No results for input(s): CKTOTAL, CKMB, CKMBINDEX, TROPONINI in the last 168 hours. BNP: Invalid input(s): POCBNP CBG: No results for input(s): GLUCAP in the last 168 hours. D-Dimer Recent Labs    08/26/21 0739 08/27/21 0501  DDIMER 0.42 0.35   Hgb A1c No results for input(s): HGBA1C in the last 72 hours. Lipid Profile No results for input(s): CHOL, HDL, LDLCALC, TRIG, CHOLHDL, LDLDIRECT in the last 72 hours. Thyroid function studies No results for input(s): TSH, T4TOTAL, T3FREE, THYROIDAB in the last 72 hours.  Invalid input(s): FREET3 Anemia work up Recent Labs    08/26/21 0846 08/27/21 0501  FERRITIN 76 80   Urinalysis    Component Value Date/Time   COLORURINE YELLOW 02/25/2019 [place] 02/25/2019 1043   LABSPEC 1.019 02/25/2019 1043   PHURINE 5.0 02/25/2019 1043   GLUCOSEU NEGATIVE 02/25/2019 1043   HGBUR NEGATIVE  02/25/2019 1043   [place] 02/25/2019 1043   [place] 02/25/2019 1043   PROTEINUR NEGATIVE 02/25/2019 1043   NITRITE NEGATIVE 02/25/2019 1043   LEUKOCYTESUR NEGATIVE 02/25/2019 1043   Sepsis Labs Invalid input(s): PROCALCITONIN,  WBC,  LACTICIDVEN Microbiology Recent Results (from the past 240 hour(s))  Resp Panel by RT-PCR (Flu A&B, Covid) Nasopharyngeal Swab     Status: Abnormal   Collection Time: 08/25/21  9:31 AM   Specimen: Nasopharyngeal Swab; Nasopharyngeal(NP) swabs in vial transport medium  Result Value Ref Range Status   SARS Coronavirus 2 by RT PCR POSITIVE (A) NEGATIVE Final    Comment: CRITICAL RESULT CALLED TO, READ BACK BY AND VERIFIED WITH:  A.MCNEIL @ 1132 08/25/21 BY STEPHTR (NOTE) SARS-CoV-2 target nucleic acids are DETECTED.  The SARS-CoV-2 RNA is generally detectable in upper respiratory specimens during the acute phase of infection. Positive results are indicative of the presence of the identified virus, but do not rule out bacterial infection or co-infection with other pathogens not detected by the test. Clinical correlation with patient history and other diagnostic information is necessary to determine patient infection status. The expected result is Negative.  Fact Sheet for Patients: EntrepreneurPulse.com.au  Fact Sheet for Healthcare Providers: IncredibleEmployment.be  This test is not yet approved or cleared by the [place] FDA and  has been authorized for detection and/or diagnosis of SARS-CoV-2 by FDA under an Emergency Use Authorization (EUA).  This EUA will remain in effect (meaning this  test can be used) for the duration of  the COVID-19 declaration under Section 564(b)(1) of the Act, 21 U.S.C. section 360bbb-3(b)(1), unless the authorization is terminated or revoked sooner.     Influenza A by PCR NEGATIVE NEGATIVE Final   Influenza B by PCR NEGATIVE NEGATIVE Final    Comment:  (NOTE) The Xpert Xpress SARS-CoV-2/FLU/RSV plus assay is intended as an aid in the diagnosis of influenza from Nasopharyngeal swab specimens and should not be used as a sole basis for treatment. Nasal washings and aspirates are unacceptable for Xpert Xpress SARS-CoV-2/FLU/RSV testing.  Fact Sheet for Patients: EntrepreneurPulse.com.au  Fact Sheet for Healthcare Providers: IncredibleEmployment.be  This test is not yet approved or cleared by the [place] FDA and has been authorized for detection and/or diagnosis of SARS-CoV-2 by FDA under an Emergency Use Authorization (EUA). This EUA will remain in  effect (meaning this test can be used) for the duration of the COVID-19 declaration under Section 564(b)(1) of the Act, 21 U.S.C. section 360bbb-3(b)(1), unless the authorization is terminated or revoked.  Performed at [facility], [address], [place], [place] [address]      Time coordinating discharge: 35 minutes  SIGNED:   Cordelia Poche, MD [facility] 08/27/2021, 11:21 AM

## 2021-08-27 NOTE — Plan of Care (Signed)

## 2021-08-28 ENCOUNTER — Telehealth: Payer: Self-pay | Admitting: *Deleted

## 2021-08-28 ENCOUNTER — Other Ambulatory Visit: Payer: Self-pay | Admitting: *Deleted

## 2021-08-28 DIAGNOSIS — Z87891 Personal history of nicotine dependence: Secondary | ICD-10-CM | POA: Diagnosis not present

## 2021-08-28 DIAGNOSIS — Z951 Presence of aortocoronary bypass graft: Secondary | ICD-10-CM | POA: Diagnosis not present

## 2021-08-28 DIAGNOSIS — M159 Polyosteoarthritis, unspecified: Secondary | ICD-10-CM | POA: Diagnosis not present

## 2021-08-28 DIAGNOSIS — I2510 Atherosclerotic heart disease of native coronary artery without angina pectoris: Secondary | ICD-10-CM | POA: Diagnosis not present

## 2021-08-28 DIAGNOSIS — Z7952 Long term (current) use of systemic steroids: Secondary | ICD-10-CM | POA: Diagnosis not present

## 2021-08-28 DIAGNOSIS — U071 COVID-19: Secondary | ICD-10-CM | POA: Diagnosis not present

## 2021-08-28 DIAGNOSIS — K219 Gastro-esophageal reflux disease without esophagitis: Secondary | ICD-10-CM | POA: Diagnosis not present

## 2021-08-28 DIAGNOSIS — Z9181 History of falling: Secondary | ICD-10-CM | POA: Diagnosis not present

## 2021-08-28 DIAGNOSIS — J441 Chronic obstructive pulmonary disease with (acute) exacerbation: Secondary | ICD-10-CM | POA: Diagnosis not present

## 2021-08-28 DIAGNOSIS — N400 Enlarged prostate without lower urinary tract symptoms: Secondary | ICD-10-CM | POA: Diagnosis not present

## 2021-08-28 MED ORDER — IPRATROPIUM-ALBUTEROL 0.5-2.5 (3) MG/3ML IN SOLN: 3.0000 mL | RESPIRATORY_TRACT | 2 refills | Status: DC | PRN

## 2021-08-28 NOTE — Telephone Encounter (Signed)
Transition Care Management Follow-up Telephone Call Date of discharge and from where: 08/27/2021 [facility] How have you been since you were released from the hospital? Per patient doing ok Any questions or concerns? Yes  Items Reviewed: Did the pt receive and understand the discharge instructions provided? Yes  Medications obtained and verified? Yes  Other? No  Any new allergies since your discharge? No  Dietary orders reviewed? Yes Do you have support at home? Yes   Home Care and Equipment/Supplies: Were home health services ordered? Yes If so, what is the name of the agency? Patient not aware of name Has the agency set up a time to come to the patient's home? Not at this time.  Were any new equipment or medical supplies ordered?  No What is the name of the medical supply agency? NA Were you able to get the supplies/equipment? not applicable Do you have any questions related to the use of the equipment or supplies? No  Functional Questionnaire: (I = Independent and D = Dependent) ADLs: I  Bathing/Dressing- I  Meal Prep- I  Eating- I  Maintaining continence- I  Transferring/Ambulation- I  Managing Meds- I  Follow up appointments reviewed:  PCP Hospital f/u appt confirmed? Yes  Scheduled to see Demetrius Revel, NP on 08/30/2021 @ 11 am [facility] f/u appt confirmed? NA Are transportation arrangements needed? No  If their condition worsens, is the pt aware to call PCP or go to the Emergency Dept.? Yes Was the patient provided with contact information for the PCP's office or ED? Yes Was to pt encouraged to call back with questions or concerns? Yes

## 2021-08-30 ENCOUNTER — Encounter: Payer: Self-pay | Admitting: Nurse Practitioner

## 2021-08-30 ENCOUNTER — Ambulatory Visit (INDEPENDENT_AMBULATORY_CARE_PROVIDER_SITE_OTHER): Payer: Medicare Other | Admitting: Nurse Practitioner

## 2021-08-30 ENCOUNTER — Other Ambulatory Visit: Payer: Self-pay

## 2021-08-30 DIAGNOSIS — J441 Chronic obstructive pulmonary disease with (acute) exacerbation: Secondary | ICD-10-CM
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
## 2021-08-30 NOTE — Assessment & Plan Note (Signed)
-  COVID caused COPD exacerbation -followed by pulmonology -he was prescribed combivent at the hospital, but he states the inhaler he still has from the hospital, but it was too expensive at the pharmacy -he was prescribed q4h duoneb via nebulizer, but he states he never received that -Rx. Duoneb; sent to [facility]; will try to go through Med Part B to make more affordable

## 2021-08-30 NOTE — Patient Instructions (Addendum)
If symptoms get worse, return to clinic.  Follow-up with pulmonology in about a month.

## 2021-09-01 DIAGNOSIS — U071 COVID-19: Secondary | ICD-10-CM | POA: Diagnosis not present

## 2021-09-01 DIAGNOSIS — N400 Enlarged prostate without lower urinary tract symptoms: Secondary | ICD-10-CM | POA: Diagnosis not present

## 2021-09-01 DIAGNOSIS — K219 Gastro-esophageal reflux disease without esophagitis: Secondary | ICD-10-CM | POA: Diagnosis not present

## 2021-09-01 DIAGNOSIS — I2510 Atherosclerotic heart disease of native coronary artery without angina pectoris: Secondary | ICD-10-CM | POA: Diagnosis not present

## 2021-09-01 DIAGNOSIS — M159 Polyosteoarthritis, unspecified: Secondary | ICD-10-CM | POA: Diagnosis not present

## 2021-09-01 DIAGNOSIS — J441 Chronic obstructive pulmonary disease with (acute) exacerbation: Secondary | ICD-10-CM | POA: Diagnosis not present

## 2021-09-04 DIAGNOSIS — K219 Gastro-esophageal reflux disease without esophagitis: Secondary | ICD-10-CM | POA: Diagnosis not present

## 2021-09-04 DIAGNOSIS — J441 Chronic obstructive pulmonary disease with (acute) exacerbation: Secondary | ICD-10-CM | POA: Diagnosis not present

## 2021-09-04 DIAGNOSIS — M159 Polyosteoarthritis, unspecified: Secondary | ICD-10-CM | POA: Diagnosis not present

## 2021-09-04 DIAGNOSIS — I2510 Atherosclerotic heart disease of native coronary artery without angina pectoris: Secondary | ICD-10-CM | POA: Diagnosis not present

## 2021-09-04 DIAGNOSIS — U071 COVID-19: Secondary | ICD-10-CM | POA: Diagnosis not present

## 2021-09-04 DIAGNOSIS — N400 Enlarged prostate without lower urinary tract symptoms: Secondary | ICD-10-CM | POA: Diagnosis not present

## 2021-09-05 ENCOUNTER — Telehealth: Payer: Self-pay | Admitting: Internal Medicine

## 2021-09-05 NOTE — Telephone Encounter (Signed)
Patient for [employer] faxed to [facility] @ [phone]

## 2021-09-06 DIAGNOSIS — U071 COVID-19: Secondary | ICD-10-CM | POA: Diagnosis not present

## 2021-09-06 DIAGNOSIS — J441 Chronic obstructive pulmonary disease with (acute) exacerbation: Secondary | ICD-10-CM | POA: Diagnosis not present

## 2021-09-06 DIAGNOSIS — I2510 Atherosclerotic heart disease of native coronary artery without angina pectoris: Secondary | ICD-10-CM | POA: Diagnosis not present

## 2021-09-06 DIAGNOSIS — M159 Polyosteoarthritis, unspecified: Secondary | ICD-10-CM | POA: Diagnosis not present

## 2021-09-06 DIAGNOSIS — N400 Enlarged prostate without lower urinary tract symptoms: Secondary | ICD-10-CM | POA: Diagnosis not present

## 2021-09-06 DIAGNOSIS — K219 Gastro-esophageal reflux disease without esophagitis: Secondary | ICD-10-CM | POA: Diagnosis not present

## 2021-09-14 ENCOUNTER — Other Ambulatory Visit: Payer: Self-pay | Admitting: Cardiology

## 2021-09-19 ENCOUNTER — Other Ambulatory Visit: Payer: Self-pay

## 2021-09-19 DIAGNOSIS — U071 COVID-19: Secondary | ICD-10-CM | POA: Diagnosis not present

## 2021-09-19 DIAGNOSIS — Z0000 Encounter for general adult medical examination without abnormal findings: Secondary | ICD-10-CM

## 2021-09-19 DIAGNOSIS — N400 Enlarged prostate without lower urinary tract symptoms: Secondary | ICD-10-CM | POA: Diagnosis not present

## 2021-09-19 DIAGNOSIS — J441 Chronic obstructive pulmonary disease with (acute) exacerbation: Secondary | ICD-10-CM | POA: Diagnosis not present

## 2021-09-19 DIAGNOSIS — M159 Polyosteoarthritis, unspecified: Secondary | ICD-10-CM | POA: Diagnosis not present

## 2021-09-19 DIAGNOSIS — K219 Gastro-esophageal reflux disease without esophagitis: Secondary | ICD-10-CM | POA: Diagnosis not present

## 2021-09-19 DIAGNOSIS — I2510 Atherosclerotic heart disease of native coronary artery without angina pectoris: Secondary | ICD-10-CM | POA: Diagnosis not present

## 2021-09-19 NOTE — Patient Instructions (Addendum)
Benjamin [name] , Thank you for taking time to come for your Medicare Wellness Visit. I appreciate your ongoing commitment to your health goals. Please review the following plan we discussed and let me know if I can assist you in the future.   These are the goals we discussed:  Goals   None     This is a list of the screening recommended for you and due dates:  Health Maintenance  Topic Date Due   Zoster (Shingles) Vaccine (1 of 2) Never done   Eye exam for diabetics  05/20/2019   COVID-19 Vaccine (3 - Pfizer risk series) 01/09/2020   Urine Protein Check  09/05/2021   Complete foot exam   09/06/2021   Hemoglobin A1C  01/20/2022   Tetanus Vaccine  03/31/2027   Pneumonia Vaccine  Completed   Flu Shot  Completed   Hepatitis C Screening: USPSTF Recommendation to screen - Ages 18-79 yo.  Completed   HPV Vaccine  Aged Out    Health Maintenance, Male Adopting a healthy lifestyle and getting preventive care are important in promoting health and wellness. Ask your health care provider about: The right schedule for you to have regular tests and exams. Things you can do on your own to prevent diseases and keep yourself healthy. What should I know about diet, weight, and exercise? Eat a healthy diet  Eat a diet that includes plenty of vegetables, fruits, low-fat dairy products, and lean protein. Do not eat a lot of foods that are high in solid fats, added sugars, or sodium. Maintain a healthy weight Body mass index (BMI) is a measurement that can be used to identify possible weight problems. It estimates body fat based on height and weight. Your health care provider can help determine your BMI and help you achieve or maintain a healthy weight. Get regular exercise Get regular exercise. This is one of the most important things you can do for your health. Most adults should: Exercise for at least 150 minutes each week. The exercise should increase your heart rate and make you sweat  (moderate-intensity exercise). Do strengthening exercises at least twice a week. This is in addition to the moderate-intensity exercise. Spend less time sitting. Even light physical activity can be beneficial. Watch cholesterol and blood lipids Have your blood tested for lipids and cholesterol at 80 years of age, then have this test every 5 years. You may need to have your cholesterol levels checked more often if: Your lipid or cholesterol levels are high. You are older than 80 years of age. You are at high risk for heart disease. What should I know about cancer screening? Many types of cancers can be detected early and may often be prevented. Depending on your health history and family history, you may need to have cancer screening at various ages. This may include screening for: Colorectal cancer. Prostate cancer. Skin cancer. Lung cancer. What should I know about heart disease, diabetes, and high blood pressure? Blood pressure and heart disease High blood pressure causes heart disease and increases the risk of stroke. This is more likely to develop in people who have high blood pressure readings or are overweight. Talk with your health care provider about your target blood pressure readings. Have your blood pressure checked: Every 3-5 years if you are 26-17 years of age. Every year if you are 45 years old or older. If you are between the ages of 103 and 61 and are a current or former smoker, ask your health  care provider if you should have a one-time screening for abdominal aortic aneurysm (AAA). Diabetes Have regular diabetes screenings. This checks your fasting blood sugar level. Have the screening done: Once every three years after age 51 if you are at a normal weight and have a low risk for diabetes. More often and at a younger age if you are overweight or have a high risk for diabetes. What should I know about preventing infection? Hepatitis B If you have a higher risk for  hepatitis B, you should be screened for this virus. Talk with your health care provider to find out if you are at risk for hepatitis B infection. Hepatitis C Blood testing is recommended for: Everyone born from [birth year] through 1964. Anyone with known risk factors for hepatitis C. Sexually transmitted infections (STIs) You should be screened each year for STIs, including gonorrhea and chlamydia, if: You are sexually active and are younger than 80 years of age. You are older than 80 years of age and your health care provider tells you that you are at risk for this type of infection. Your sexual activity has changed since you were last screened, and you are at increased risk for chlamydia or gonorrhea. Ask your health care provider if you are at risk. Ask your health care provider about whether you are at high risk for HIV. Your health care provider may recommend a prescription medicine to help prevent HIV infection. If you choose to take medicine to prevent HIV, you should first get tested for HIV. You should then be tested every 3 months for as long as you are taking the medicine. Follow these instructions at home: Alcohol use Do not drink alcohol if your health care provider tells you not to drink. If you drink alcohol: Limit how much you have to 0-2 drinks a day. Know how much alcohol is in your drink. In the [place] one drink equals one 12 oz bottle of beer (355 mL), one 5 oz glass of wine (148 mL), or one 1 oz glass of hard liquor (44 mL). Lifestyle Do not use any products that contain nicotine or tobacco. These products include cigarettes, chewing tobacco, and vaping devices, such as e-cigarettes. If you need help quitting, ask your health care provider. Do not use street drugs. Do not share needles. Ask your health care provider for help if you need support or information about quitting drugs. General instructions Schedule regular health, dental, and eye exams. Stay current with your  vaccines. Tell your health care provider if: You often feel depressed. You have ever been abused or do not feel safe at home. Summary Adopting a healthy lifestyle and getting preventive care are important in promoting health and wellness. Follow your health care provider's instructions about healthy diet, exercising, and getting tested or screened for diseases. Follow your health care provider's instructions on monitoring your cholesterol and blood pressure. This information is not intended to replace advice given to you by your health care provider. Make sure you discuss any questions you have with your health care provider. Document Revised: 01/22/2021 Document Reviewed: 01/22/2021 Elsevier Patient Education  [place].
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
## 2021-09-24 ENCOUNTER — Telehealth: Payer: Self-pay | Admitting: Cardiology

## 2021-09-24 MED ORDER — METOPROLOL TARTRATE 25 MG PO TABS: 12.5000 mg | ORAL_TABLET | Freq: Two times a day (BID) | ORAL | 1 refills | Status: DC

## 2021-09-24 NOTE — Telephone Encounter (Signed)
°*  STAT* If patient is at the pharmacy, call can be transferred to refill team.   1. Which medications need to be refilled? (please list name of each medication and dose if known)  metoprolol tartrate (LOPRESSOR) 25 MG tablet  2. Which pharmacy/location (including street and city if local pharmacy) is medication to be sent to? [place], [place] [address]  3. Do they need a 30 day or 90 day supply? 90 with refills    Patient is scheduled to see Dr. Domenic Polite 12/18/21

## 2021-09-24 NOTE — Telephone Encounter (Signed)
Done

## 2021-09-28 ENCOUNTER — Telehealth: Payer: Self-pay

## 2021-09-28 NOTE — Telephone Encounter (Signed)
I talked with Benjamin [name] @ [place], [place] for [place] approved, start 08/07/2021 to 09/06/2022.

## 2021-10-02 ENCOUNTER — Encounter: Payer: Self-pay | Admitting: *Deleted

## 2021-10-08 ENCOUNTER — Other Ambulatory Visit: Payer: Self-pay

## 2021-10-08 ENCOUNTER — Encounter: Payer: Self-pay | Admitting: Internal Medicine

## 2021-10-08 ENCOUNTER — Ambulatory Visit (INDEPENDENT_AMBULATORY_CARE_PROVIDER_SITE_OTHER): Payer: Medicare Other | Admitting: Internal Medicine

## 2021-10-08 DIAGNOSIS — J0110 Acute frontal sinusitis, unspecified: Secondary | ICD-10-CM

## 2021-10-08 MED ORDER — AZITHROMYCIN 250 MG PO TABS: ORAL_TABLET | ORAL | 0 refills | Status: DC
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
## 2021-10-10 ENCOUNTER — Inpatient Hospital Stay (HOSPITAL_COMMUNITY)
Admission: EM | Admit: 2021-10-10 | Discharge: 2021-10-12 | DRG: 189 | Disposition: A | Discharge status: Skilled Nursing Facility | Payer: Medicare Other | Attending: Family Medicine | Admitting: Family Medicine

## 2021-10-10 ENCOUNTER — Encounter (HOSPITAL_COMMUNITY): Payer: Self-pay | Admitting: Emergency Medicine

## 2021-10-10 ENCOUNTER — Other Ambulatory Visit: Payer: Self-pay

## 2021-10-10 ENCOUNTER — Inpatient Hospital Stay (HOSPITAL_COMMUNITY): Payer: Medicare Other

## 2021-10-10 ENCOUNTER — Emergency Department (HOSPITAL_COMMUNITY): Payer: Medicare Other

## 2021-10-10 DIAGNOSIS — J449 Chronic obstructive pulmonary disease, unspecified: Secondary | ICD-10-CM | POA: Diagnosis present

## 2021-10-10 DIAGNOSIS — I5032 Chronic diastolic (congestive) heart failure: Secondary | ICD-10-CM | POA: Diagnosis not present

## 2021-10-10 DIAGNOSIS — Z85828 Personal history of other malignant neoplasm of skin: Secondary | ICD-10-CM

## 2021-10-10 DIAGNOSIS — I2510 Atherosclerotic heart disease of native coronary artery without angina pectoris: Secondary | ICD-10-CM | POA: Diagnosis present

## 2021-10-10 DIAGNOSIS — Z825 Family history of asthma and other chronic lower respiratory diseases: Secondary | ICD-10-CM

## 2021-10-10 DIAGNOSIS — K219 Gastro-esophageal reflux disease without esophagitis: Secondary | ICD-10-CM | POA: Diagnosis present

## 2021-10-10 DIAGNOSIS — Z888 Allergy status to other drugs, medicaments and biological substances status: Secondary | ICD-10-CM | POA: Diagnosis not present

## 2021-10-10 DIAGNOSIS — Z91041 Radiographic dye allergy status: Secondary | ICD-10-CM | POA: Diagnosis not present

## 2021-10-10 DIAGNOSIS — I499 Cardiac arrhythmia, unspecified: Secondary | ICD-10-CM | POA: Diagnosis not present

## 2021-10-10 DIAGNOSIS — R911 Solitary pulmonary nodule: Secondary | ICD-10-CM | POA: Diagnosis not present

## 2021-10-10 DIAGNOSIS — R531 Weakness: Secondary | ICD-10-CM | POA: Diagnosis present

## 2021-10-10 DIAGNOSIS — Z20822 Contact with and (suspected) exposure to covid-19: Secondary | ICD-10-CM | POA: Diagnosis present

## 2021-10-10 DIAGNOSIS — J439 Emphysema, unspecified: Secondary | ICD-10-CM | POA: Diagnosis not present

## 2021-10-10 DIAGNOSIS — Z79899 Other long term (current) drug therapy: Secondary | ICD-10-CM

## 2021-10-10 DIAGNOSIS — Z8249 Family history of ischemic heart disease and other diseases of the circulatory system: Secondary | ICD-10-CM | POA: Diagnosis not present

## 2021-10-10 DIAGNOSIS — J4489 Other specified chronic obstructive pulmonary disease: Secondary | ICD-10-CM | POA: Diagnosis present

## 2021-10-10 DIAGNOSIS — N400 Enlarged prostate without lower urinary tract symptoms: Secondary | ICD-10-CM | POA: Diagnosis present

## 2021-10-10 DIAGNOSIS — J479 Bronchiectasis, uncomplicated: Secondary | ICD-10-CM | POA: Diagnosis not present

## 2021-10-10 DIAGNOSIS — Z87891 Personal history of nicotine dependence: Secondary | ICD-10-CM

## 2021-10-10 DIAGNOSIS — J9601 Acute respiratory failure with hypoxia: Secondary | ICD-10-CM

## 2021-10-10 DIAGNOSIS — Z951 Presence of aortocoronary bypass graft: Secondary | ICD-10-CM | POA: Diagnosis not present

## 2021-10-10 DIAGNOSIS — J9621 Acute and chronic respiratory failure with hypoxia: Secondary | ICD-10-CM | POA: Diagnosis not present

## 2021-10-10 DIAGNOSIS — R0902 Hypoxemia: Secondary | ICD-10-CM | POA: Diagnosis not present

## 2021-10-10 DIAGNOSIS — J9690 Respiratory failure, unspecified, unspecified whether with hypoxia or hypercapnia: Secondary | ICD-10-CM | POA: Diagnosis not present

## 2021-10-10 DIAGNOSIS — I700 Atherosclerosis of aorta: Secondary | ICD-10-CM | POA: Diagnosis not present

## 2021-10-10 DIAGNOSIS — Z743 Need for continuous supervision: Secondary | ICD-10-CM | POA: Diagnosis not present

## 2021-10-10 DIAGNOSIS — Z8616 Personal history of COVID-19: Secondary | ICD-10-CM | POA: Diagnosis not present

## 2021-10-10 DIAGNOSIS — R0602 Shortness of breath: Secondary | ICD-10-CM | POA: Diagnosis not present

## 2021-10-10 LAB — CBC WITH DIFFERENTIAL/PLATELET
Abs Immature Granulocytes: 0.02 10*3/uL (ref 0.00–0.07)
Basophils Absolute: 0.1 10*3/uL (ref 0.0–0.1)
Basophils Relative: 1 %
Eosinophils Absolute: 0.4 10*3/uL (ref 0.0–0.5)
Eosinophils Relative: 6 %
HCT: 47.1 % (ref 39.0–52.0)
Hemoglobin: 15.7 g/dL (ref 13.0–17.0)
Immature Granulocytes: 0 %
Lymphocytes Relative: 14 %
Lymphs Abs: 0.9 10*3/uL (ref 0.7–4.0)
MCH: 31.7 pg (ref 26.0–34.0)
MCHC: 33.3 g/dL (ref 30.0–36.0)
MCV: 95.2 fL (ref 80.0–100.0)
Monocytes Absolute: 0.4 10*3/uL (ref 0.1–1.0)
Monocytes Relative: 7 %
Neutro Abs: 4.7 10*3/uL (ref 1.7–7.7)
Neutrophils Relative %: 72 %
Platelets: 220 10*3/uL (ref 150–400)
RBC: 4.95 MIL/uL (ref 4.22–5.81)
RDW: 14.2 % (ref 11.5–15.5)
WBC: 6.4 10*3/uL (ref 4.0–10.5)
nRBC: 0 % (ref 0.0–0.2)

## 2021-10-10 LAB — COMPREHENSIVE METABOLIC PANEL
ALT: 48 U/L — ABNORMAL HIGH (ref 0–44)
AST: 38 U/L (ref 15–41)
Albumin: 3.8 g/dL (ref 3.5–5.0)
Alkaline Phosphatase: 48 U/L (ref 38–126)
Anion gap: 9 (ref 5–15)
BUN: 19 mg/dL (ref 8–23)
CO2: 20 mmol/L — ABNORMAL LOW (ref 22–32)
Calcium: 8.7 mg/dL — ABNORMAL LOW (ref 8.9–10.3)
Chloride: 107 mmol/L (ref 98–111)
Creatinine, Ser: 0.93 mg/dL (ref 0.61–1.24)
GFR, Estimated: >60 mL/min (ref >60)
Glucose, Bld: 139 mg/dL — ABNORMAL HIGH (ref 70–99)
Potassium: 3.8 mmol/L (ref 3.5–5.1)
Sodium: 136 mmol/L (ref 135–145)
Total Bilirubin: 1.8 mg/dL — ABNORMAL HIGH (ref 0.3–1.2)
Total Protein: 6.8 g/dL (ref 6.5–8.1)

## 2021-10-10 LAB — URINALYSIS, ROUTINE W REFLEX MICROSCOPIC
Bilirubin Urine: NEGATIVE
Glucose, UA: NEGATIVE mg/dL
Hgb urine dipstick: NEGATIVE
Ketones, ur: 5 mg/dL — AB
Leukocytes,Ua: NEGATIVE
Nitrite: NEGATIVE
Protein, ur: NEGATIVE mg/dL
Specific Gravity, Urine: 1.015 (ref 1.005–1.030)
pH: 5 (ref 5.0–8.0)

## 2021-10-10 LAB — RESP PANEL BY RT-PCR (FLU A&B, COVID) ARPGX2
Influenza A by PCR: NEGATIVE
Influenza B by PCR: NEGATIVE
SARS Coronavirus 2 by RT PCR: NEGATIVE

## 2021-10-10 LAB — BRAIN NATRIURETIC PEPTIDE: B Natriuretic Peptide: 298 pg/mL — ABNORMAL HIGH (ref 0.0–100.0)

## 2021-10-10 LAB — MAGNESIUM: Magnesium: 1.9 mg/dL (ref 1.7–2.4)

## 2021-10-10 LAB — D-DIMER, QUANTITATIVE: D-Dimer, Quant: 0.66 ug/mL-FEU — ABNORMAL HIGH (ref 0.00–0.50)

## 2021-10-10 LAB — ETHANOL: Alcohol, Ethyl (B): <10 mg/dL (ref <10)

## 2021-10-10 MED ORDER — METOPROLOL TARTRATE 25 MG PO TABS
12.5000 mg | ORAL_TABLET | Freq: Two times a day (BID) | ORAL | Status: DC
  Administered 2021-10-10 – 2021-10-12 (×4): 12.5 mg via ORAL
  Filled 2021-10-10 (×4): qty 1

## 2021-10-10 MED ORDER — ONDANSETRON HCL 4 MG PO TABS: 4.0000 mg | ORAL_TABLET | Freq: Four times a day (QID) | ORAL | Status: DC | PRN

## 2021-10-10 MED ORDER — TAMSULOSIN HCL 0.4 MG PO CAPS
0.8000 mg | ORAL_CAPSULE | Freq: Every day | ORAL | Status: DC
  Administered 2021-10-11 – 2021-10-12 (×2): 0.8 mg via ORAL
  Filled 2021-10-10 (×2): qty 2

## 2021-10-10 MED ORDER — PANTOPRAZOLE SODIUM 40 MG PO TBEC
80.0000 mg | DELAYED_RELEASE_TABLET | Freq: Two times a day (BID) | ORAL | Status: DC
  Administered 2021-10-10 – 2021-10-12 (×4): 80 mg via ORAL
  Filled 2021-10-10 (×6): qty 2

## 2021-10-10 MED ORDER — POLYETHYLENE GLYCOL 3350 17 G PO PACK: 17.0000 g | PACK | Freq: Every day | ORAL | Status: DC | PRN

## 2021-10-10 MED ORDER — IPRATROPIUM-ALBUTEROL 0.5-2.5 (3) MG/3ML IN SOLN
3.0000 mL | Freq: Three times a day (TID) | RESPIRATORY_TRACT | Status: AC
  Administered 2021-10-10 – 2021-10-11 (×2): 3 mL via RESPIRATORY_TRACT
  Filled 2021-10-10 (×2): qty 3

## 2021-10-10 MED ORDER — ACETAMINOPHEN 650 MG RE SUPP: 650.0000 mg | Freq: Four times a day (QID) | RECTAL | Status: DC | PRN

## 2021-10-10 MED ORDER — ACETAMINOPHEN 325 MG PO TABS: 650.0000 mg | ORAL_TABLET | Freq: Four times a day (QID) | ORAL | Status: DC | PRN

## 2021-10-10 MED ORDER — ENOXAPARIN SODIUM 40 MG/0.4ML IJ SOSY
40.0000 mg | PREFILLED_SYRINGE | INTRAMUSCULAR | Status: DC
  Administered 2021-10-10 – 2021-10-11 (×2): 40 mg via SUBCUTANEOUS
  Filled 2021-10-10 (×2): qty 0.4

## 2021-10-10 MED ORDER — GUAIFENESIN-DM 100-10 MG/5ML PO SYRP
10.0000 mL | ORAL_SOLUTION | Freq: Three times a day (TID) | ORAL | Status: AC
  Administered 2021-10-10 – 2021-10-11 (×3): 10 mL via ORAL
  Filled 2021-10-10 (×3): qty 10

## 2021-10-10 MED ORDER — FUROSEMIDE 10 MG/ML IJ SOLN
40.0000 mg | Freq: Once | INTRAMUSCULAR | Status: AC
  Administered 2021-10-10: 18:00:00 40 mg via INTRAVENOUS
  Filled 2021-10-10: qty 4

## 2021-10-10 MED ORDER — DEXAMETHASONE SODIUM PHOSPHATE 10 MG/ML IJ SOLN
10.0000 mg | Freq: Once | INTRAMUSCULAR | Status: AC
  Administered 2021-10-10: 18:00:00 10 mg via INTRAVENOUS
  Filled 2021-10-10: qty 1

## 2021-10-10 MED ORDER — ONDANSETRON HCL 4 MG/2ML IJ SOLN: 4.0000 mg | Freq: Four times a day (QID) | INTRAMUSCULAR | Status: DC | PRN

## 2021-10-10 MED ORDER — IPRATROPIUM-ALBUTEROL 0.5-2.5 (3) MG/3ML IN SOLN: 3.0000 mL | RESPIRATORY_TRACT | Status: DC | PRN

## 2021-10-10 NOTE — ED Notes (Signed)
RT placed on venturi mask at 30%.

## 2021-10-10 NOTE — ED Notes (Signed)
RT aware venturi needs to go up. Pt in no distress
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
## 2021-10-10 NOTE — ED Provider Notes (Signed)
[facility] Provider Note   CSN: [id number] Arrival date & time: 10/10/21  1521     History  Chief Complaint  Patient presents with   Weakness    Benjamin [name] is a 81 y.o. male.  HPI Patient presents with his wife who assists with history. Presents due to weakness and diffuse pain.  Both are universal, throughout his habitus, without focality.  There is no new fever, no vomiting, no diarrhea, no rash.  Seems as though patient has been unwell since being diagnosed with COVID about 6 weeks ago, hospitalized for several days.  It is unclear if he received any medication during that illness.  Now, over the past 2 or 3 days in particular he has been weaker than usual, with inability to perform ADL whereas he was previously able to do some as recently as a week ago.  He denies confusion, disorientation, speech difficulty, vision changes.    Home Medications Prior to Admission medications   Medication Sig Start Date End Date Taking? Authorizing Provider  azithromycin (ZITHROMAX) 250 MG tablet Take 2 tablets on day 1, then 1 tablet daily on days 2 through 5 10/08/21 10/13/21  Lindell Spar, MD  colestipol (COLESTID) 1 g tablet Take 2 g by mouth 2 (two) times daily. 06/30/20   [provider]  CRANBERRY EXTRACT PO Take 400 mg by mouth 2 (two) times daily.    [provider]  HYDROcodone-acetaminophen (NORCO) 5-325 MG tablet Take 1 tablet by mouth every 6 (six) hours as needed for moderate pain. 05/07/21   [name], MD  Ipratropium-Albuterol (COMBIVENT) 20-100 MCG/ACT AERS respimat Inhale 1 puff into the lungs every 4 (four) hours as needed for wheezing or shortness of breath. 08/27/21   Mariel Aloe, MD  ipratropium-albuterol (DUONEB) 0.5-2.5 (3) MG/3ML SOLN Take 3 mLs by nebulization every 4 (four) hours as needed. 08/28/21   Noreene Larsson, NP  metoprolol tartrate (LOPRESSOR) 25 MG tablet Take 0.5 tablets (12.5 mg total) by mouth 2 (two) times  daily. 09/24/21   Satira Sark, MD  pantoprazole (PROTONIX) 40 MG tablet Take 80 mg by mouth 2 (two) times daily. 11/21/19   [provider]  REPATHA SURECLICK 235 MG/ML SOAJ INJECT 140MG  SUBCUTANEOUSLY EVERY 14 DAYS AS DIRECTED BY MD 12/15/20   Pixie Casino, MD  tamsulosin (FLOMAX) 0.4 MG CAPS capsule TAKE 2 CAPSULES BY MOUTH ONCE DAILY AFTER SUPPER FOR PROSTATE Patient taking differently: Take 0.8 mg by mouth daily. TAKE 2 CAPSULES BY MOUTH ONCE DAILY AFTER SUPPER FOR PROSTATE 01/25/21   Noreene Larsson, NP      Allergies    Arsenic, Contrast media [iodinated contrast media], and Statins    Review of Systems   Review of Systems  Constitutional:        Per HPI, otherwise negative  HENT:         Per HPI, otherwise negative  Respiratory:         Per HPI, otherwise negative  Cardiovascular:        Per HPI, otherwise negative  Gastrointestinal:  Negative for vomiting.  Endocrine:       Negative aside from HPI  Genitourinary:        Neg aside from HPI   Musculoskeletal:        Per HPI, otherwise negative  Skin: Negative.   Neurological:  Positive for weakness. Negative for syncope.   Physical Exam Updated Vital Signs BP 121/84    Pulse 70  Temp 97.6 F (36.4 C) (Oral)    Resp (!) 22    SpO2 99%  Physical Exam Vitals and nursing note reviewed.  Constitutional:      General: He is not in acute distress.    Appearance: He is well-developed.  HENT:     Head: Normocephalic and atraumatic.  Eyes:     Conjunctiva/sclera: Conjunctivae normal.  Cardiovascular:     Rate and Rhythm: Normal rate and regular rhythm.  Pulmonary:     Effort: Pulmonary effort is normal. No respiratory distress.     Breath sounds: No stridor.  Abdominal:     General: There is no distension.  Skin:    General: Skin is warm and dry.  Neurological:     Mental Status: He is alert and oriented to person, place, and time.     Motor: Atrophy present. No tremor.     Comments: Global weakness  3/5 all extremities.  Face is symmetric, speech is clear.    ED Results / Procedures / Treatments   Labs (all labs ordered are listed, but only abnormal results are displayed) Labs Reviewed  BRAIN NATRIURETIC PEPTIDE - Abnormal; Notable for the following components:      Result Value   B Natriuretic Peptide 298.0 (*)    All other components within normal limits  COMPREHENSIVE METABOLIC PANEL - Abnormal; Notable for the following components:   CO2 20 (*)    Glucose, Bld 139 (*)    Calcium 8.7 (*)    ALT 48 (*)    Total Bilirubin 1.8 (*)    All other components within normal limits  ETHANOL  CBC WITH DIFFERENTIAL/PLATELET  MAGNESIUM  URINALYSIS, ROUTINE W REFLEX MICROSCOPIC  D-DIMER, QUANTITATIVE    EKG EKG Interpretation  Date/Time:  Wednesday October 10 2021 15:44:55 EST Ventricular Rate:  58 PR Interval:  168 QRS Duration: 105 QT Interval:  463 QTC Calculation: 455 R Axis:   80 Text Interpretation: Sinus rhythm Low voltage, precordial leads RSR' in V1 or V2, right VCD or RVH ST-t wave abnormality Abnormal ECG Confirmed by Carmin Muskrat [phone]) on 10/10/2021 3:59:24 PM  Radiology DG Chest 2 View  Result Date: 10/10/2021 CLINICAL DATA:  Generalized weakness all over for 2-3 weeks, getting over COVID-19 2 weeks ago, still positive on home test today EXAM: CHEST - 2 VIEW COMPARISON:  08/25/2021 FINDINGS: Normal heart size post CABG. Mediastinal contours and pulmonary vascularity normal. Atherosclerotic calcification aorta. Persistent bibasilar pulmonary infiltrates. Upper lungs clear. No pleural effusion or pneumothorax. Bones demineralized. IMPRESSION: Persistent bibasilar infiltrates. Aortic Atherosclerosis (ICD10-I70.0). Electronically Signed   By: Lavonia Dana M.D.   On: 10/10/2021 16:21    Procedures Procedures    Medications Ordered in ED Medications  furosemide (LASIX) injection 40 mg (has no administration in time range)  dexamethasone (DECADRON) injection 10 mg  (has no administration in time range)    ED Course/ Medical Decision Making/ A&P Including stroke, electrolyte abnormalities, lingering COVID, pneumonia or other infection all considered.  Patient placed on monitoring, initial pulse ox 88% room air abnormal Cardiac 60 sinus normal  Chart review notable for admission for COVID last month.  5:53 PM On repeat exam the patient has oxygen saturation 86% on room air, nasal cannula not tolerated secondary to nasal congestion, patient will receive Ventimask or nonrebreather.  Labs reviewed, discussed, x-ray interpreted, notable for bilateral opacification atelectasis versus prior infection.  Though the patient had COVID, low suspicion for PE given his denial of chest pain or dyspnea.  However, given his persistent weakness, some suspicion for hypoxia likely multifactorial secondary to diastolic dysfunction and COPD contributing to this.  No evidence for bacteremia, sepsis, with no leukocytosis, no fever.  Patient has no focal pain.  Given new oxygen requirement, the context of likely multifactorial etiology, patient will require admission after IV Lasix, supplemental oxygen, steroids were administered in the emergency department.                         Medical Decision Making Amount and/or Complexity of Data Reviewed Independent Historian: spouse External Data Reviewed: notes. Labs: ordered. Decision-making details documented in ED Course. Radiology: ordered and independent interpretation performed. Decision-making details documented in ED Course. ECG/medicine tests: ordered and independent interpretation performed. Decision-making details documented in ED Course.  Risk Prescription drug management. Decision regarding hospitalization.  Critical Care Total time providing critical care: < 30 minutes       Final Clinical Impression(s) / ED Diagnoses Final diagnoses:  Hypoxia  Weakness     Carmin Muskrat, MD 10/10/21 1753

## 2021-10-10 NOTE — ED Triage Notes (Signed)
Pt states he was getting over COVID 2 weeks ago. Took another home test today and was still positive. C/o pain all over, states has been weak but got worse this morning. Pt is a/o. Nad.

## 2021-10-10 NOTE — ED Notes (Addendum)
Bs 146 in route. Pt states his normal sats are around 88-91%. Was in hospital around dec 10 with covid. Smile symmetrical, denies feeling weak on one side more than the other. States he feels weak and hurts all over. Wife at bedside left after talking with EDP. Stated to call her when pt is ready, [place]

## 2021-10-10 NOTE — ED Notes (Signed)
Pt sats 86-91 range ra.no resp distress or sob noted.  Per edp tried [place] but pt stated was stopped up and couldn't tolerate it. Ptnow on nonrebreather 10Lnc per edp. Nad.

## 2021-10-11 DIAGNOSIS — J9601 Acute respiratory failure with hypoxia: Secondary | ICD-10-CM | POA: Diagnosis not present

## 2021-10-11 LAB — BASIC METABOLIC PANEL
Anion gap: 12 (ref 5–15)
BUN: 24 mg/dL — ABNORMAL HIGH (ref 8–23)
CO2: 23 mmol/L (ref 22–32)
Calcium: 9.4 mg/dL (ref 8.9–10.3)
Chloride: 103 mmol/L (ref 98–111)
Creatinine, Ser: 1.21 mg/dL (ref 0.61–1.24)
GFR, Estimated: >60 mL/min (ref >60)
Glucose, Bld: 151 mg/dL — ABNORMAL HIGH (ref 70–99)
Potassium: 3.9 mmol/L (ref 3.5–5.1)
Sodium: 138 mmol/L (ref 135–145)

## 2021-10-11 LAB — CBC
HCT: 50.3 % (ref 39.0–52.0)
Hemoglobin: 17 g/dL (ref 13.0–17.0)
MCH: 31.5 pg (ref 26.0–34.0)
MCHC: 33.8 g/dL (ref 30.0–36.0)
MCV: 93.1 fL (ref 80.0–100.0)
Platelets: 270 10*3/uL (ref 150–400)
RBC: 5.4 MIL/uL (ref 4.22–5.81)
RDW: 14 % (ref 11.5–15.5)
WBC: 5 10*3/uL (ref 4.0–10.5)
nRBC: 0 % (ref 0.0–0.2)

## 2021-10-11 LAB — PROCALCITONIN: Procalcitonin: <0.1 ng/mL

## 2021-10-11 MED ORDER — AZITHROMYCIN 250 MG PO TABS
250.0000 mg | ORAL_TABLET | Freq: Every day | ORAL | Status: DC
  Administered 2021-10-11 – 2021-10-12 (×2): 250 mg via ORAL
  Filled 2021-10-11 (×2): qty 1

## 2021-10-11 NOTE — Progress Notes (Signed)
HPI: Benjamin [name] is a 81 y.o. male with medical history significant for diastolic CHF, COPD, CABG. Patient presented to the ED with complaints of generalized weakness over the past 2 to 3 days or significantly worsening today.  Reports chronic difficulty breathing, that is not significantly changed, since he was in the hospital 08/27/2021 for COPD exacerbation in the setting of COVID-19 infection.  Reports his cough is worse. His O2 sats usually range from 88 to 91%. No chest pain, no difficulty breathing.  Not on home O2.  ED Course: O2 sats down to 86% on room air.  Patient did not tolerate nasal cannula, due to congestion, worsening cough with, so he was placed on Ventimask.  Temperature 97.6.  Heart rate 57-81.  Respiratory rate 17-24.  Blood pressure systolic 829-562. WBC 6.4.  BNP elevated 298.  Normal magnesium 1.9.  D-dimer 0.66.  Chest x-ray shows persistent bibasilar infiltrates. Lasix 40 mg x 1 given for CHF, and dexamethasone 10 mg given. Hospitalist to admit.  Subjective Patient sitting in his bed with his Ventimask on his chin.  Speaking in full sentences without any difficulty with good color  Physical Exam: Vitals:   10/10/21 2123 10/11/21 0019 10/11/21 0423 10/11/21 0502  BP:  108/70 113/73   Pulse:  65 66   Resp:  20 15   Temp:  98.2 F (36.8 C) 98.4 F (36.9 C)   TempSrc:  Oral    SpO2: 93% 93% 94% 93%    Constitutional: NAD, calm, comfortable Vitals:   10/10/21 2123 10/11/21 0019 10/11/21 0423 10/11/21 0502  BP:  108/70 113/73   Pulse:  65 66   Resp:  20 15   Temp:  98.2 F (36.8 C) 98.4 F (36.9 C)   TempSrc:  Oral    SpO2: 93% 93% 94% 93%   Eyes: PERRL, lids and conjunctivae normal ENMT: Mucous membranes are moist.   Neck: normal, supple, no masses, no thyromegaly Respiratory: clear to auscultation bilaterally, no wheezing, no crackles. Normal respiratory effort. No accessory muscle use.  Cardiovascular: Regular rate and rhythm, no murmurs / rubs /  gallops. No extremity edema.  Lower extremities warm.   Abdomen: no tenderness, no masses palpated. No hepatosplenomegaly. Bowel sounds positive.  Musculoskeletal: no clubbing / cyanosis. No joint deformity upper and lower extremities. Good ROM, no contractures. Normal muscle tone.  Skin: no rashes, lesions, ulcers. No induration Neurologic: No apparent cranial abnormality, moving all extremities spontaneously. Psychiatric: Normal judgment and insight. Alert and oriented x 3. Normal mood.   Labs on Admission: I have personally reviewed following labs and imaging studies  CBC: Recent Labs  Lab 10/10/21 1634 10/11/21 0552  WBC 6.4 5.0  NEUTROABS 4.7  --   HGB 15.7 17.0  HCT 47.1 50.3  MCV 95.2 93.1  PLT 220 130   Basic Metabolic Panel: Recent Labs  Lab 10/10/21 1634 10/11/21 0552  NA 136 138  K 3.8 3.9  CL 107 103  CO2 20* 23  GLUCOSE 139* 151*  BUN 19 24*  CREATININE 0.93 1.21  CALCIUM 8.7* 9.4  MG 1.9  --    GFR: CrCl cannot be calculated (Unknown ideal weight.). Liver Function Tests: Recent Labs  Lab 10/10/21 1634  AST 38  ALT 48*  ALKPHOS 48  BILITOT 1.8*  PROT 6.8  ALBUMIN 3.8    Radiological Exams on Admission: DG Chest 2 View  Result Date: 10/10/2021 CLINICAL DATA:  Generalized weakness all over for 2-3 weeks, getting over COVID-19 2  weeks ago, still positive on home test today EXAM: CHEST - 2 VIEW COMPARISON:  08/25/2021 FINDINGS: Normal heart size post CABG. Mediastinal contours and pulmonary vascularity normal. Atherosclerotic calcification aorta. Persistent bibasilar pulmonary infiltrates. Upper lungs clear. No pleural effusion or pneumothorax. Bones demineralized. IMPRESSION: Persistent bibasilar infiltrates. Aortic Atherosclerosis (ICD10-I70.0). Electronically Signed   By: Lavonia Dana M.D.   On: 10/10/2021 16:21   CT CHEST WO CONTRAST  Result Date: 10/10/2021 CLINICAL DATA:  Acute hypoxic respiratory failure, BNP elevated, chest x-ray suggesting  persistent bilateral basilar infiltrates. EXAM: CT CHEST WITHOUT CONTRAST TECHNIQUE: Multidetector CT imaging of the chest was performed following the standard protocol without IV contrast. RADIATION DOSE REDUCTION: This exam was performed according to the departmental dose-optimization program which includes automated exposure control, adjustment of the mA and/or kV according to patient size and/or use of iterative reconstruction technique. COMPARISON:  Radiograph earlier today.  Chest CT 01/21/2019 FINDINGS: Cardiovascular: The heart is normal in size. Post CABG. Atherosclerosis of the thoracic aorta. Calcification of native coronary arteries. No pericardial effusion. Mediastinum/Nodes: Small mediastinal lymph nodes are not enlarged by size criteria, similar to prior exam and typically reactive. No esophageal wall thickening. Small hiatal hernia. No visualized thyroid nodule. Lungs/Pleura: Moderate emphysema. Streaky and ill-defined dependent opacities within both lower lobes, similar distribution to prior exam, but increased in degree. Mild associated bibasilar bronchiectasis. There is biapical pleuroparenchymal scarring. No acute airspace consolidation. No pulmonary mass or suspicious nodule. No significant pleural effusion. There is no septal thickening or findings of pulmonary edema. The trachea and central bronchi are patent. Upper Abdomen: No acute upper abdominal findings. Low-density left adrenal nodule is stable, consistent with adenoma. Musculoskeletal: Thoracic spondylosis with endplate spurring. There are no acute or suspicious osseous abnormalities. IMPRESSION: 1. Streaky and ill-defined dependent opacities within both lower lobes. This is in a similar distribution to 2021 exam, but increased in degree. Findings favor progressive chronic atelectasis, however superimposed infection or aspiration are also considered. 2. Moderately advanced emphysema. 3. No pulmonary edema or findings of congestive  failure. Aortic Atherosclerosis (ICD10-I70.0) and Emphysema (ICD10-J43.9). Electronically Signed   By: Keith Rake M.D.   On: 10/10/2021 22:59    EKG: Independently reviewed.  Sinus rhythm rate 58.  QTc 455.  No significant change from prior.  Assessment/Plan Principal Problem:   Acute respiratory failure with hypoxia (HCC) Active Problems:   COPD GOLD 2   Chronic diastolic CHF (congestive heart failure) (HCC)   S/P CABG (coronary artery bypass graft)   General weakness   Acute respiratory failure with hypoxia-O2 sats 86% on room air, patient on Ventimask, did not tolerate nasal cannula, keeps removing his Ventimask with subsequent desaturation.  Chronic dyspnea from recent COVID diagnosis 08/2021.  Reports baseline O2 sats 88 to 91%.  Intermittent tachypnea 17-24.  Not tachycardic.  Chest x-ray shows persistent bibasilar infiltrates.  BNP elevated at 298. DDimer- WNL 0.66, adjusted for age. -CT chest Wo contrast (contrast allergy) no PE query early pneumonia -COVID and flu negative  -DuoNebs as needed and scheduled -Check procalcitonin -IV Lasix 40 mg x 1 given in ED -Azithromycin prescribed as outpatient 1/23.  Continue -Patient not even wearing Ventimask, wean oxygen as likely does not need 15 L of oxygen  CABG-stable.  Denies chest pain.  EKG unchanged. -Resume metoprolol, tamsulosin  COPD-appears stable.  Chronic diastolic CHF-BNP elevated at 298.  At this time no sign of peripheral volume overload, last echo 01/2020, EF 60 to 65% with G1DD. -IV Lasix 40 mg x  1 given in ED.  He is not on home diuretics.   DVT prophylaxis: Lovenox Code Status: Full code Family Communication: None at bedside Disposition Plan: Discharge in once weaned off oxygen successfully  consults called: None Admission status: Inpt, tele I certify that at the point of admission it is my clinical judgment that the patient will require inpatient hospital care spanning beyond 2 midnights from the point of  admission due to high intensity of service, high risk for further deterioration and high frequency of surveillance required.    Phillips Grout MD [facility]  10/11/2021, 10:04 AM   Patient ID: Benjamin [name], male   DOB: February 15, 1941, 81 y.o.   MRN: [id number]

## 2021-10-11 NOTE — Progress Notes (Signed)
°  Transition of Care [facility]) Screening Note   Patient Details  Name: Benjamin [name] Date of Birth: Jan 15, 1941   Transition of Care [facility]) CM/SW Contact:    Iona Beard, [place] Phone Number: 10/11/2021, 2:25 PM    Transition of Care Department [facility]) has reviewed patient and no TOC needs have been identified at this time. We will continue to monitor patient advancement through interdisciplinary progression rounds. If new patient transition needs arise, please place a TOC consult.

## 2021-10-12 ENCOUNTER — Inpatient Hospital Stay (HOSPITAL_COMMUNITY): Payer: Medicare Other

## 2021-10-12 DIAGNOSIS — R0602 Shortness of breath: Secondary | ICD-10-CM | POA: Diagnosis not present

## 2021-10-12 DIAGNOSIS — J9601 Acute respiratory failure with hypoxia: Secondary | ICD-10-CM | POA: Diagnosis not present

## 2021-10-12 LAB — ECHOCARDIOGRAM COMPLETE
Area-P 1/2: 2.69 cm2
Calc EF: 71.3 %
S' Lateral: 2.2 cm
Single Plane A2C EF: 67.4 %
Single Plane A4C EF: 76.1 %

## 2021-10-12 MED ORDER — FUROSEMIDE 10 MG/ML IJ SOLN
40.0000 mg | Freq: Once | INTRAMUSCULAR | Status: AC
  Administered 2021-10-12: 40 mg via INTRAVENOUS
  Filled 2021-10-12: qty 4

## 2021-10-12 MED ORDER — ACETAMINOPHEN 650 MG RE SUPP: 650.0000 mg | Freq: Four times a day (QID) | RECTAL | 0 refills | Status: DC | PRN

## 2021-10-12 NOTE — Plan of Care (Signed)
°  Problem: Acute Rehab PT Goals(only PT should resolve) Goal: Patient Will Transfer Sit To/From Stand Outcome: Progressing Flowsheets (Taken 10/12/2021 1009) Patient will transfer sit to/from stand: with minimal assist Goal: Pt Will Transfer Bed To Chair/Chair To Bed Outcome: Progressing Flowsheets (Taken 10/12/2021 1009) Pt will Transfer Bed to Chair/Chair to Bed: with min assist Goal: Pt Will Ambulate Outcome: Progressing Flowsheets (Taken 10/12/2021 1009) Pt will Ambulate:  25 feet  with minimal assist  with least restrictive assistive device Goal: Pt/caregiver will Perform Home Exercise Program Outcome: Progressing Flowsheets (Taken 10/12/2021 1009) Pt/caregiver will Perform Home Exercise Program:  For increased strengthening  For improved balance  Independently  10:09 AM, 10/12/21 Mearl Latin PT, DPT Physical Therapist at [facility]

## 2021-10-12 NOTE — Care Management Important Message (Signed)
Important Message  Patient Details  Name: Benjamin [name] MRN: [id number] Date of Birth: 06-02-1941   Medicare Important Message Given:  Yes     [name] 10/12/2021, 12:04 PM

## 2021-10-12 NOTE — Progress Notes (Signed)
HPI: Benjamin [name] is a 81 y.o. male with medical history significant for diastolic CHF, COPD, CABG. Patient presented to the ED with complaints of generalized weakness over the past 2 to 3 days or significantly worsening today.  Reports chronic difficulty breathing, that is not significantly changed, since he was in the hospital 08/27/2021 for COPD exacerbation in the setting of COVID-19 infection.  Reports his cough is worse. His O2 sats usually range from 88 to 91%. No chest pain, no difficulty breathing.  Not on home O2.  ED Course: O2 sats down to 86% on room air.  Patient did not tolerate nasal cannula, due to congestion, worsening cough with, so he was placed on Ventimask.  Temperature 97.6.  Heart rate 57-81.  Respiratory rate 17-24.  Blood pressure systolic 326-712. WBC 6.4.  BNP elevated 298.  Normal magnesium 1.9.  D-dimer 0.66.  Chest x-ray shows persistent bibasilar infiltrates. Lasix 40 mg x 1 given for CHF, and dexamethasone 10 mg given. Hospitalist to admit.  Subjective Patient participating with physical therapy right now on room air very short winded  Physical Exam: Vitals:   10/11/21 2022 10/11/21 2025 10/11/21 2126 10/12/21 0637  BP:   121/68 (!) 142/72  Pulse:   63 (!) 54  Resp:   17 18  Temp:   98.4 F (36.9 C) 98.2 F (36.8 C)  TempSrc:   Oral   SpO2: (!) 87% 92% 93% 93%    Constitutional: NAD, calm, comfortable Vitals:   10/11/21 2022 10/11/21 2025 10/11/21 2126 10/12/21 0637  BP:   121/68 (!) 142/72  Pulse:   63 (!) 54  Resp:   17 18  Temp:   98.4 F (36.9 C) 98.2 F (36.8 C)  TempSrc:   Oral   SpO2: (!) 87% 92% 93% 93%   Eyes: PERRL, lids and conjunctivae normal ENMT: Mucous membranes are moist.   Neck: normal, supple, no masses, no thyromegaly Respiratory: clear to auscultation bilaterally, no wheezing, no crackles. Normal respiratory effort. No accessory muscle use.  Cardiovascular: Regular rate and rhythm, no murmurs / rubs / gallops. No  extremity edema.  Lower extremities warm.   Abdomen: no tenderness, no masses palpated. No hepatosplenomegaly. Bowel sounds positive.  Musculoskeletal: no clubbing / cyanosis. No joint deformity upper and lower extremities. Good ROM, no contractures. Normal muscle tone.  Skin: no rashes, lesions, ulcers. No induration Neurologic: No apparent cranial abnormality, moving all extremities spontaneously. Psychiatric: Normal judgment and insight. Alert and oriented x 3. Normal mood.   Labs on Admission: I have personally reviewed following labs and imaging studies  CBC: Recent Labs  Lab 10/10/21 1634 10/11/21 0552  WBC 6.4 5.0  NEUTROABS 4.7  --   HGB 15.7 17.0  HCT 47.1 50.3  MCV 95.2 93.1  PLT 220 458   Basic Metabolic Panel: Recent Labs  Lab 10/10/21 1634 10/11/21 0552  NA 136 138  K 3.8 3.9  CL 107 103  CO2 20* 23  GLUCOSE 139* 151*  BUN 19 24*  CREATININE 0.93 1.21  CALCIUM 8.7* 9.4  MG 1.9  --    GFR: CrCl cannot be calculated (Unknown ideal weight.). Liver Function Tests: Recent Labs  Lab 10/10/21 1634  AST 38  ALT 48*  ALKPHOS 48  BILITOT 1.8*  PROT 6.8  ALBUMIN 3.8    Radiological Exams on Admission: DG Chest 2 View  Result Date: 10/10/2021 CLINICAL DATA:  Generalized weakness all over for 2-3 weeks, getting over COVID-19 2 weeks ago, still  positive on home test today EXAM: CHEST - 2 VIEW COMPARISON:  08/25/2021 FINDINGS: Normal heart size post CABG. Mediastinal contours and pulmonary vascularity normal. Atherosclerotic calcification aorta. Persistent bibasilar pulmonary infiltrates. Upper lungs clear. No pleural effusion or pneumothorax. Bones demineralized. IMPRESSION: Persistent bibasilar infiltrates. Aortic Atherosclerosis (ICD10-I70.0). Electronically Signed   By: Lavonia Dana M.D.   On: 10/10/2021 16:21   CT CHEST WO CONTRAST  Result Date: 10/10/2021 CLINICAL DATA:  Acute hypoxic respiratory failure, BNP elevated, chest x-ray suggesting persistent  bilateral basilar infiltrates. EXAM: CT CHEST WITHOUT CONTRAST TECHNIQUE: Multidetector CT imaging of the chest was performed following the standard protocol without IV contrast. RADIATION DOSE REDUCTION: This exam was performed according to the departmental dose-optimization program which includes automated exposure control, adjustment of the mA and/or kV according to patient size and/or use of iterative reconstruction technique. COMPARISON:  Radiograph earlier today.  Chest CT 01/21/2019 FINDINGS: Cardiovascular: The heart is normal in size. Post CABG. Atherosclerosis of the thoracic aorta. Calcification of native coronary arteries. No pericardial effusion. Mediastinum/Nodes: Small mediastinal lymph nodes are not enlarged by size criteria, similar to prior exam and typically reactive. No esophageal wall thickening. Small hiatal hernia. No visualized thyroid nodule. Lungs/Pleura: Moderate emphysema. Streaky and ill-defined dependent opacities within both lower lobes, similar distribution to prior exam, but increased in degree. Mild associated bibasilar bronchiectasis. There is biapical pleuroparenchymal scarring. No acute airspace consolidation. No pulmonary mass or suspicious nodule. No significant pleural effusion. There is no septal thickening or findings of pulmonary edema. The trachea and central bronchi are patent. Upper Abdomen: No acute upper abdominal findings. Low-density left adrenal nodule is stable, consistent with adenoma. Musculoskeletal: Thoracic spondylosis with endplate spurring. There are no acute or suspicious osseous abnormalities. IMPRESSION: 1. Streaky and ill-defined dependent opacities within both lower lobes. This is in a similar distribution to 2021 exam, but increased in degree. Findings favor progressive chronic atelectasis, however superimposed infection or aspiration are also considered. 2. Moderately advanced emphysema. 3. No pulmonary edema or findings of congestive failure. Aortic  Atherosclerosis (ICD10-I70.0) and Emphysema (ICD10-J43.9). Electronically Signed   By: Keith Rake M.D.   On: 10/10/2021 22:59    EKG: Independently reviewed.  Sinus rhythm rate 58.  QTc 455.  No significant change from prior.  Assessment/Plan Principal Problem:   Acute respiratory failure with hypoxia (HCC) Active Problems:   COPD GOLD 2   Chronic diastolic CHF (congestive heart failure) (HCC)   S/P CABG (coronary artery bypass graft)   General weakness   Acute respiratory failure with hypoxia-O2 sats 86% on room air, patient on Ventimask, did not tolerate nasal cannula, keeps removing his Ventimask with subsequent desaturation.  Chronic dyspnea from recent COVID diagnosis 08/2021.  Reports baseline O2 sats 88 to 91%.  Intermittent tachypnea 17-24.  Not tachycardic.  Chest x-ray shows persistent bibasilar infiltrates.  BNP elevated at 298. DDimer- WNL 0.66, adjusted for age. -CT chest Wo contrast (contrast allergy) no PE query early pneumonia -COVID and flu negative  -DuoNebs as needed and scheduled -Check procalcitonin -IV Lasix 40 mg x 1 given in ED, repeat dose today -Azithromycin prescribed as outpatient 1/23.  Continue -Patient not even wearing Ventimask, wean oxygen as likely does not need 15 L of oxygen  CABG-stable.  Denies chest pain.  EKG unchanged. -Resume metoprolol, tamsulosin  COPD-appears stable.  Chronic diastolic CHF-BNP elevated at 298.  At this time no sign of peripheral volume overload, last echo 01/2020, EF 60 to 65% with G1DD. -IV Lasix 40 mg x  1 given in ED.  He is not on home diuretics.   Will likely need short-term rehab.  Awaiting PT evaluation and recommendations.  If weaned off of oxygen today will be ready for discharge in the next 24 to 48 hours.  Karenann Mcgrory A MD [facility]  10/12/2021, 9:55 AM

## 2021-10-12 NOTE — Evaluation (Signed)
Physical Therapy Evaluation Patient Details Name: Benjamin [name] MRN: [id number] DOB: [birth year] Today's Date: 10/12/2021  History of Present Illness  Benjamin [name] is a 81 y.o. male with medical history significant for diastolic CHF, COPD, CABG.  Patient presented to the ED with complaints of generalized weakness over the past 2 to 3 days or significantly worsening today.  Reports chronic difficulty breathing, that is not significantly changed, since he was in the hospital 08/27/2021 for COPD exacerbation in the setting of COVID-19 infection.  Reports his cough is worse. His O2 sats usually range from 88 to 91%.   Clinical Impression  Patient limited for functional mobility as stated below secondary to BLE weakness, fatigue and poor standing balance. O2 sat monitored on room air as follows: 93-94% seated at rest, 86-93% with activity/ambulation, quickly increases to low 90s with rest, minimal SOB. Patient requires assist and RW with transfers and ambulation secondary to impaired LE strength and balance. Patient will benefit from continued physical therapy in hospital and recommended venue below to increase strength, balance, endurance for safe ADLs and gait.        Recommendations for follow up therapy are one component of a multi-disciplinary discharge planning process, led by the attending physician.  Recommendations may be updated based on patient status, additional functional criteria and insurance authorization.  Follow Up Recommendations Skilled nursing-short term rehab (<3 hours/day)    Assistance Recommended at Discharge Frequent or constant Supervision/Assistance  Patient can return home with the following  A lot of help with walking and/or transfers;A lot of help with bathing/dressing/bathroom;Help with stairs or ramp for entrance;Assistance with cooking/housework    Equipment Recommendations None recommended by PT  Recommendations for Other Services       Functional Status Assessment  Patient has had a recent decline in their functional status and demonstrates the ability to make significant improvements in function in a reasonable and predictable amount of time.     Precautions / Restrictions Precautions Precautions: Fall Restrictions Weight Bearing Restrictions: No      Mobility  Bed Mobility Overal bed mobility: Needs Assistance Bed Mobility: Supine to Sit, Sit to Supine     Supine to sit: Supervision, HOB elevated Sit to supine: Supervision, HOB elevated        Transfers Overall transfer level: Needs assistance Equipment used: Rolling walker (2 wheels) Transfers: Sit to/from Stand Sit to Stand: Mod assist           General transfer comment: slow, labored, assist for weakness along with RW    Ambulation/Gait Ambulation/Gait assistance: Mod assist, Min assist Gait Distance (Feet): 13 Feet Assistive device: Rolling walker (2 wheels) Gait Pattern/deviations: Step-through pattern, Trunk flexed, Knees buckling Gait velocity: decreased     General Gait Details: labored, unsteady, knees buckling with fatigue  Stairs            Wheelchair Mobility    Modified Rankin (Stroke Patients Only)       Balance Overall balance assessment: Needs assistance Sitting-balance support: No upper extremity supported, Feet supported Sitting balance-Leahy Scale: Good Sitting balance - Comments: seated EOB   Standing balance support: Bilateral upper extremity supported, Reliant on assistive device for balance Standing balance-Leahy Scale: Poor Standing balance comment: requires RW                             Pertinent Vitals/Pain Pain Assessment Pain Assessment: No/denies pain    Home Living Family/patient expects to be discharged to::  Private residence Living Arrangements: Spouse/significant other Available Help at Discharge: Family;Available 24 hours/day Type of Home: House Home Access: Stairs to enter Entrance Stairs-Rails:  Right;Left;Can reach both Entrance Stairs-Number of Steps: 4-5   Home Layout: One level Home Equipment: Conservation officer, nature (2 wheels);Cane - single point;Shower seat;BSC/3in1;Grab bars - toilet;Grab bars - tub/shower      Prior Function Prior Level of Function : Independent/Modified Independent             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, drives; states generalized weakness since last d/c a month ago ADLs Comments: Independent     Hand Dominance   Dominant Hand: Right    Extremity/Trunk Assessment   Upper Extremity Assessment Upper Extremity Assessment: Overall WFL for tasks assessed    Lower Extremity Assessment Lower Extremity Assessment: Generalized weakness    Cervical / Trunk Assessment Cervical / Trunk Assessment: Normal  Communication   Communication: HOH  Cognition Arousal/Alertness: Awake/alert Behavior During Therapy: WFL for tasks assessed/performed Overall Cognitive Status: Within Functional Limits for tasks assessed                                          General Comments      Exercises General Exercises - Lower Extremity Hip Flexion/Marching: AROM, Both, 10 reps, Seated, Standing   Assessment/Plan    PT Assessment Patient needs continued PT services  PT Problem List Decreased strength;Decreased mobility;Decreased activity tolerance;Decreased balance;Cardiopulmonary status limiting activity       PT Treatment Interventions DME instruction;Therapeutic exercise;Gait training;Balance training;Stair training;Neuromuscular re-education;Functional mobility training;Therapeutic activities;Patient/family education    PT Goals (Current goals can be found in the Care Plan section)  Acute Rehab PT Goals Patient Stated Goal: Return home PT Goal Formulation: With patient Time For Goal Achievement: 10/26/21 Potential to Achieve Goals: Fair    Frequency Min 3X/week     Co-evaluation               AM-PAC  PT "6 Clicks" Mobility  Outcome Measure Help needed turning from your back to your side while in a flat bed without using bedrails?: None Help needed moving from lying on your back to sitting on the side of a flat bed without using bedrails?: A Little Help needed moving to and from a bed to a chair (including a wheelchair)?: A Lot Help needed standing up from a chair using your arms (e.g., wheelchair or bedside chair)?: A Lot Help needed to walk in hospital room?: A Lot Help needed climbing 3-5 steps with a railing? : A Lot 6 Click Score: 15    End of Session Equipment Utilized During Treatment: Gait belt Activity Tolerance: Patient tolerated treatment well;Patient limited by fatigue Patient left: in bed;with call bell/phone within reach;with bed alarm set Nurse Communication: Mobility status PT Visit Diagnosis: Unsteadiness on feet (R26.81);Other abnormalities of gait and mobility (R26.89);Muscle weakness (generalized) (M62.81)    Time: 3382-5053 PT Time Calculation (min) (ACUTE ONLY): 30 min   Charges:   PT Evaluation $PT Eval Moderate Complexity: 1 Mod PT Treatments $Therapeutic Exercise: 8-22 mins $Therapeutic Activity: 8-22 mins        10:07 AM, 10/12/21 Mearl Latin PT, DPT Physical Therapist at [facility]

## 2021-10-12 NOTE — Discharge Summary (Signed)
Physician Discharge Summary  Patient ID: Benjamin [name]: [id number] DOB/AGE: 80/14/1942 80 y.o.  Admit date: 10/10/2021 Discharge date: 10/12/2021  Admission Diagnoses:  Discharge Diagnoses:  Principal Problem:   Acute respiratory failure with hypoxia ([place]) Active Problems:   COPD GOLD 2   Chronic diastolic CHF (congestive heart failure) (HCC)   S/P CABG (coronary artery bypass graft)   General weakness   Discharged Condition: stable  Hospital Course: Benjamin [name] is a 81 y.o. male with medical history significant for diastolic CHF, COPD, CABG. Patient presented to the ED with complaints of generalized weakness over the past 2 to 3 days or significantly worsening today.  Reports chronic difficulty breathing, that is not significantly changed, since he was in the hospital 08/27/2021 for COPD exacerbation in the setting of COVID-19 infection.  Reports his cough is worse. His O2 sats usually range from 88 to 91%. No chest pain, no difficulty breathing.  Not on home O2.   ED Course: O2 sats down to 86% on room air.  Patient did not tolerate nasal cannula, due to congestion, worsening cough with, so he was placed on Ventimask.  Temperature 97.6.  Heart rate 57-81.  Respiratory rate 17-24.  Blood pressure systolic 854-627. WBC 6.4.  BNP elevated 298.  Normal magnesium 1.9.  D-dimer 0.66.  Chest x-ray shows persistent bibasilar infiltrates. Lasix 40 mg x 1 given for CHF, and dexamethasone 10 mg given. Hospitalist to admit.  Acute on likely chronic respiratory failure with hypoxia-O2 sats 86% on room air, patient on Ventimask, did not tolerate nasal cannula, keeps removing his Ventimask with subsequent desaturation.  Chronic dyspnea from recent COVID diagnosis 08/2021.  Reports baseline O2 sats 88 to 91%.  Intermittent tachypnea 17-24.  Not tachycardic.  Chest x-ray shows persistent bibasilar infiltrates but do not suspect active bacterial infection.  Procalcitonin normal.  White count normal.  No  fever.  Completed azithromycin as an outpatient already.  Suspect much of his respiratory failure is chronic.  BNP elevated at 298. DDimer- WNL 0.66, adjusted for age.  Given a dose of Lasix. -CT chest Wo contrast (contrast allergy) no query edema -COVID and flu negative  -DuoNebs as needed and scheduled -IV Lasix 40 mg x 1 given in ED, repeat dose today -Azithromycin prescribed as outpatient 1/23.  do not continue    CABG-stable.  Denies chest pain.  EKG unchanged. -Resume metoprolol, tamsulosin   COPD-appears stable.   Chronic diastolic CHF-BNP elevated at 298.  At this time no sign of peripheral volume overload, last echo 01/2020, EF 60 to 65% with G1DD.     Discharge Exam: Blood pressure 90/67, pulse 71, temperature 97.8 F (36.6 C), temperature source Oral, resp. rate 20, SpO2 90 %. General appearance: alert and cooperative Neck: no adenopathy, no carotid bruit, no JVD, supple, symmetrical, trachea midline, and thyroid not enlarged, symmetric, no tenderness/mass/nodules Resp: clear to auscultation bilaterally Cardio: regular rate and rhythm, S1, S2 normal, no murmur, click, rub or gallop GI: soft, non-tender; bowel sounds normal; no masses,  no organomegaly Extremities: extremities normal, atraumatic, no cyanosis or edema Pulses: 2+ and symmetric Skin: Skin color, texture, turgor normal. No rashes or lesions  Disposition: Discharge disposition: 03-Skilled Nursing Facility       Discharge Instructions     Diet - low sodium heart healthy   Complete by: As directed    Discharge instructions   Complete by: As directed    Follow-up with primary care physician in 1 week   Increase activity slowly  Complete by: As directed       Allergies as of 10/12/2021       Reactions   Arsenic Swelling   Severe swelling if patient comes in contact    Contrast Media [iodinated Contrast Media] Swelling, Rash   Statins Rash   Joint pain        Medication List     STOP taking  these medications    azithromycin 250 MG tablet Commonly known as: ZITHROMAX   HYDROcodone-acetaminophen 5-325 MG tablet Commonly known as: Norco       TAKE these medications    acetaminophen 500 MG tablet Commonly known as: TYLENOL Take 1,000 mg by mouth every 6 (six) hours as needed. What changed: Another medication with the same name was added. Make sure you understand how and when to take each.   acetaminophen 650 MG suppository Commonly known as: TYLENOL Place 1 suppository (650 mg total) rectally every 6 (six) hours as needed for mild pain (or Fever >/= 101). What changed: You were already taking a medication with the same name, and this prescription was added. Make sure you understand how and when to take each.   colestipol 1 g tablet Commonly known as: COLESTID Take 2 g by mouth 2 (two) times daily.   CRANBERRY EXTRACT PO Take 400 mg by mouth 2 (two) times daily.   Ipratropium-Albuterol 20-100 MCG/ACT Aers respimat Commonly known as: COMBIVENT Inhale 1 puff into the lungs every 4 (four) hours as needed for wheezing or shortness of breath.   ipratropium-albuterol 0.5-2.5 (3) MG/3ML Soln Commonly known as: DUONEB Take 3 mLs by nebulization every 4 (four) hours as needed.   metoprolol tartrate 25 MG tablet Commonly known as: LOPRESSOR Take 0.5 tablets (12.5 mg total) by mouth 2 (two) times daily.   pantoprazole 40 MG tablet Commonly known as: PROTONIX Take 80 mg by mouth 2 (two) times daily.   Repatha SureClick 845 MG/ML Soaj Generic drug: Evolocumab INJECT 140MG  SUBCUTANEOUSLY EVERY 14 DAYS AS DIRECTED BY MD What changed: See the new instructions.   tamsulosin 0.4 MG Caps capsule Commonly known as: FLOMAX TAKE 2 CAPSULES BY MOUTH ONCE DAILY AFTER SUPPER FOR PROSTATE What changed:  how much to take how to take this when to take this         Signed: [place] Jenifer A 10/12/2021, 2:52 PM

## 2021-10-12 NOTE — TOC Transition Note (Signed)
Transition of Care [facility]) - CM/SW Discharge Note   Patient Details  Name: Jrake Rodriquez MRN: [id number] Date of Birth: [birth year]  [facility]) CM/SW Contact:  Iona Beard, [place] Phone Number: 10/12/2021, 3:44 PM   Clinical Narrative:    CSW spoke to [place] at [facility] who states that they can accept pt for SNF admission today. CSW updated RN and MD of this. CSW spoke with pt who states he is agree[facility] to [facility] for SN[facility]. Staff to arrange transport for pt via tunnel. TOC signing off.   Final next level of care: Skilled Nursing Facility Barriers to Discharge: No Barriers Identified   Patient Goals and CMS Choice Patient states their goals for this hospitalization and ongoing recovery are:: Go to SNF CMS Medicare.gov Compare Post Acute Care list provided to:: Patient Choice offered to / list presented to : Patient  Discharge Placement              Patient chooses bed at: [facility] Patient to be transferred to facility by: Staff   Patient and family notified of of transfer: 10/12/21  Discharge Plan and Services In-house Referral: Clinical Social Work Discharge Planning Services: CM Consult Post Acute Care Choice: [place]                               Social Determinants of Health (SDOH) Interventions     Readmission Risk Interventions Readmission Risk Prevention Plan 08/27/2021  Medication Screening Complete  Transportation Screening Complete  Some recent data might be hidden

## 2021-10-12 NOTE — TOC Initial Note (Signed)
Transition of Care [facility]) - Initial/Assessment Note    Patient Details  Name: Benjamin [name] MRN: [id number] Date of Birth: November 10, 1940  Transition of Care [facility]) CM/SW Contact:    Iona Beard, [place] Phone Number: 10/12/2021, 12:41 PM  Clinical Narrative:                 CSW spoke with pt to complete assessment. Pt states that he lives with his wife. Pt is mostly independent in completing his ADLs, though sometimes needs assistance. Pt can drive but mostly his wife will provide transportation. Pt states that he has had HH in the past. Pt states that he has a cane and walker to use when needed. CSW spoke with pt about PT recommending SNF at D/C. Pt is agreeable to this and would like to go to SNF in [place]. CSW completed Fl2 and sent out to [facility] and [facility]. TOC to follow.   Expected Discharge Plan: Skilled Nursing Facility Barriers to Discharge: Continued Medical Work up   Patient Goals and CMS Choice Patient states their goals for this hospitalization and ongoing recovery are:: Go to SNF CMS Medicare.gov Compare Post Acute Care list provided to:: Patient Choice offered to / list presented to : Patient  Expected Discharge Plan and Services Expected Discharge Plan: [place] In-house Referral: Clinical Social Work Discharge Planning Services: CM Consult Post Acute Care Choice: [place] arrangements for the past 2 months: [place]                                      Prior Living Arrangements/Services Living arrangements for the past 2 months: Single Family Home Lives with:: Spouse Patient language and need for interpreter reviewed:: Yes Do you feel safe going back to the place where you live?: Yes      Need for Family Participation in Patient Care: Yes (Comment) Care giver support system in place?: Yes (comment) Current home services: DME Criminal Activity/Legal Involvement Pertinent to Current  Situation/Hospitalization: No - Comment as needed  Activities of Daily Living Home Assistive Devices/Equipment: Cane (specify quad or straight), Grab bars around toilet, Grab bars in shower, Walker (specify type), Wheelchair, Nebulizer (single point) ADL Screening (condition at time of admission) Patient's cognitive ability adequate to safely complete daily activities?: Yes Is the patient deaf or have difficulty hearing?: No Does the patient have difficulty seeing, even when wearing glasses/contacts?: No Does the patient have difficulty concentrating, remembering, or making decisions?: No Patient able to express need for assistance with ADLs?: Yes Does the patient have difficulty dressing or bathing?: Yes Independently performs ADLs?: No Communication: Independent Dressing (OT): Needs assistance Is this a change from baseline?: Change from baseline, expected to last <3days Grooming: Needs assistance Is this a change from baseline?: Change from baseline, expected to last <3 days Feeding: Independent Bathing: Needs assistance Is this a change from baseline?: Change from baseline, expected to last <3 days Toileting: Needs assistance Is this a change from baseline?: Change from baseline, expected to last <3 days In/Out Bed: Needs assistance Is this a change from baseline?: Change from baseline, expected to last <3 days Walks in Home: Needs assistance Is this a change from baseline?: Change from baseline, expected to last <3 days Does the patient have difficulty walking or climbing stairs?: Yes Weakness of Legs: Both Weakness of Arms/Hands: Both  Permission Sought/Granted  Emotional Assessment Appearance:: Appears stated age Attitude/Demeanor/Rapport: Engaged Affect (typically observed): Accepting Orientation: : Oriented to Self, Oriented to Place, Oriented to  Time, Oriented to Situation Alcohol / Substance Use: Not Applicable Psych Involvement: No  (comment)  Admission diagnosis:  Weakness [R53.1] Hypoxia [R09.02] Acute respiratory failure with hypoxia ([place]) [J96.01] Patient Active Problem List   Diagnosis Date Noted   Acute respiratory failure with hypoxia ([place]) 10/10/2021   General weakness 10/10/2021   COPD exacerbation ([place]) 08/25/2021   Right inguinal hernia    Hernia, umbilical 12/75/1700   Scrotal pain 04/13/2021   Exertional dyspnea 04/13/2021   Thrombocytopenia (HCC) 01/23/2020   Chronic allergic rhinitis 12/21/2019   Chronic pain syndrome 12/21/2019   Chronic insomnia 12/20/2019   AK (actinic keratosis) 03/17/2019   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   Coronary artery disease 03/01/2019   CAD (coronary artery disease) 02/05/2019   Aortic atherosclerosis ([place]) 09/22/2018   Prediabetes 09/22/2018   Chronic diastolic CHF (congestive heart failure) (HCC)    Irritable bowel syndrome with diarrhea    Coronary artery calcification seen on CAT scan    Chronic bilateral thoracic back pain 02/12/2017   GERD (gastroesophageal reflux disease) 11/27/2016   Constipation 10/28/2016   Erectile dysfunction 06/25/2016   Chronic diarrhea 06/07/2016   COPD GOLD 2 10/20/2015   DDD (degenerative disc disease), lumbar 01/11/2014   BPH (benign prostatic hyperplasia) 03/09/2012   Cataract 03/09/2012   PCP:  Lindell Spar, MD Pharmacy:   [facility] [place] - [address] [place] [address] Phone: [phone] Fax: [phone]     Social Determinants of Health (SDOH) Interventions    Readmission Risk Interventions Readmission Risk Prevention Plan 08/27/2021  Medication Screening Complete  Transportation Screening Complete  Some recent data might be hidden

## 2021-10-12 NOTE — NC FL2 (Signed)
[place] LEVEL OF CARE SCREENING TOOL     IDENTIFICATION  Patient Name: Benjamin [name] Birthdate: [birth year] Sex: male Admission Date (Current Location): 10/10/2021  [facility] and [place] Number:  [employer] and Address:  [facility] [address] [place]      Provider Number: [id number]  Attending Physician Name and Address:  Phillips Grout, MD  Relative Name and Phone Number:  [name] - spouse [phone]    Current Level of Care: Hospital Recommended Level of Care: [place] Prior Approval Number:    Date Approved/Denied:   PASRR Number: [id number]  Discharge Plan: SNF    Current Diagnoses: Patient Active Problem List   Diagnosis Date Noted   Acute respiratory failure with hypoxia ([place]) 10/10/2021   General weakness 10/10/2021   COPD exacerbation ([place]) 08/25/2021   Right inguinal hernia    Hernia, umbilical 70/96/2836   Scrotal pain 04/13/2021   Exertional dyspnea 04/13/2021   Thrombocytopenia ([place]) 01/23/2020   Chronic allergic rhinitis 12/21/2019   Chronic pain syndrome 12/21/2019   Chronic insomnia 12/20/2019   AK (actinic keratosis) 03/17/2019   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   Coronary artery disease 03/01/2019   CAD (coronary artery disease) 02/05/2019   Aortic atherosclerosis ([place]) 09/22/2018   Prediabetes 09/22/2018   Chronic diastolic CHF (congestive heart failure) (HCC)    Irritable bowel syndrome with diarrhea    Coronary artery calcification seen on CAT scan    Chronic bilateral thoracic back pain 02/12/2017   GERD (gastroesophageal reflux disease) 11/27/2016   Constipation 10/28/2016   Erectile dysfunction 06/25/2016   Chronic diarrhea 06/07/2016   COPD GOLD 2 10/20/2015   DDD (degenerative disc disease), lumbar 01/11/2014   BPH (benign prostatic hyperplasia) 03/09/2012   Cataract 03/09/2012    Orientation RESPIRATION BLADDER Height & Weight      Self, Time, Situation, Place  O2 Continent Weight:   Height:     BEHAVIORAL SYMPTOMS/MOOD NEUROLOGICAL BOWEL NUTRITION STATUS      Continent Diet (See DC summary)  AMBULATORY STATUS COMMUNICATION OF NEEDS Skin     Verbally Bruising (arms)                       Personal Care Assistance Level of Assistance  Bathing, Feeding, Dressing Bathing Assistance: Maximum assistance Feeding assistance: Limited assistance Dressing Assistance: Maximum assistance     Functional Limitations Info  Sight, Hearing, Speech Sight Info: Adequate Hearing Info: Adequate Speech Info: Adequate    SPECIAL CARE FACTORS FREQUENCY  PT (By licensed PT)     PT Frequency: 5 Times a week              Contractures Contractures Info: Not present    Additional Factors Info  Code Status, Allergies Code Status Info: FULL Allergies Info: arsenic, Contrast media, Statins           Current Medications (10/12/2021):  This is the current hospital active medication list Current Facility-Administered Medications  Medication Dose Route Frequency Provider Last Rate Last Admin   acetaminophen (TYLENOL) tablet 650 mg  650 mg Oral Q6H PRN Emokpae, Ejiroghene E, MD       Or   acetaminophen (TYLENOL) suppository 650 mg  650 mg Rectal Q6H PRN Emokpae, Ejiroghene E, MD       azithromycin (ZITHROMAX) tablet 250 mg  250 mg Oral Daily Derrill Kay A, MD   250 mg at 10/12/21 0813   enoxaparin (  LOVENOX) injection 40 mg  40 mg Subcutaneous Q24H Emokpae, Ejiroghene E, MD   40 mg at 10/11/21 2117   ipratropium-albuterol (DUONEB) 0.5-2.5 (3) MG/3ML nebulizer solution 3 mL  3 mL Nebulization Q4H PRN Emokpae, Ejiroghene E, MD       metoprolol tartrate (LOPRESSOR) tablet 12.5 mg  12.5 mg Oral BID Emokpae, Ejiroghene E, MD   12.5 mg at 10/12/21 0812   ondansetron (ZOFRAN) tablet 4 mg  4 mg Oral Q6H PRN Emokpae, Ejiroghene E, MD       Or   ondansetron (ZOFRAN) injection 4 mg  4 mg Intravenous Q6H PRN Emokpae, Ejiroghene E,  MD       pantoprazole (PROTONIX) EC tablet 80 mg  80 mg Oral BID Emokpae, Ejiroghene E, MD   80 mg at 10/12/21 0813   polyethylene glycol (MIRALAX / GLYCOLAX) packet 17 g  17 g Oral Daily PRN Emokpae, Ejiroghene E, MD       tamsulosin (FLOMAX) capsule 0.8 mg  0.8 mg Oral Daily Emokpae, Ejiroghene E, MD   0.8 mg at 10/12/21 0813     Discharge Medications: Please see discharge summary for a list of discharge medications.  Relevant Imaging Results:  Relevant Lab Results:   Additional Information SS# [id number]  Iona Beard, [place]

## 2021-10-12 NOTE — Progress Notes (Signed)
°  Echocardiogram 2D Echocardiogram has been performed.  Benjamin [name] 10/12/2021, 2:29 PM

## 2021-10-14 ENCOUNTER — Inpatient Hospital Stay
Admission: RE | Admit: 2021-10-14 | Discharge: 2021-10-17 | Disposition: A | Discharge status: Other Acute Inpatient Hospital | Payer: Medicare Other | Source: Ambulatory Visit | Attending: Internal Medicine | Admitting: Internal Medicine

## 2021-10-14 ENCOUNTER — Encounter (HOSPITAL_COMMUNITY): Payer: Self-pay | Admitting: *Deleted

## 2021-10-14 ENCOUNTER — Other Ambulatory Visit: Payer: Self-pay

## 2021-10-14 ENCOUNTER — Emergency Department (HOSPITAL_COMMUNITY)
Admission: EM | Admit: 2021-10-14 | Discharge: 2021-10-14 | Disposition: A | Discharge status: Home / Self Care | Payer: Medicare Other | Source: Home / Self Care | Attending: Emergency Medicine | Admitting: Emergency Medicine

## 2021-10-14 ENCOUNTER — Emergency Department (HOSPITAL_COMMUNITY): Payer: Medicare Other

## 2021-10-14 DIAGNOSIS — Z8616 Personal history of COVID-19: Secondary | ICD-10-CM | POA: Diagnosis not present

## 2021-10-14 DIAGNOSIS — Z9049 Acquired absence of other specified parts of digestive tract: Secondary | ICD-10-CM | POA: Diagnosis not present

## 2021-10-14 DIAGNOSIS — Z85828 Personal history of other malignant neoplasm of skin: Secondary | ICD-10-CM | POA: Diagnosis not present

## 2021-10-14 DIAGNOSIS — Z79899 Other long term (current) drug therapy: Secondary | ICD-10-CM | POA: Insufficient documentation

## 2021-10-14 DIAGNOSIS — I2510 Atherosclerotic heart disease of native coronary artery without angina pectoris: Secondary | ICD-10-CM | POA: Insufficient documentation

## 2021-10-14 DIAGNOSIS — J449 Chronic obstructive pulmonary disease, unspecified: Secondary | ICD-10-CM | POA: Diagnosis not present

## 2021-10-14 DIAGNOSIS — J189 Pneumonia, unspecified organism: Secondary | ICD-10-CM | POA: Diagnosis not present

## 2021-10-14 DIAGNOSIS — Z87891 Personal history of nicotine dependence: Secondary | ICD-10-CM | POA: Diagnosis not present

## 2021-10-14 DIAGNOSIS — R112 Nausea with vomiting, unspecified: Secondary | ICD-10-CM | POA: Diagnosis not present

## 2021-10-14 DIAGNOSIS — R000 Tachycardia, unspecified: Secondary | ICD-10-CM | POA: Diagnosis not present

## 2021-10-14 DIAGNOSIS — Z8249 Family history of ischemic heart disease and other diseases of the circulatory system: Secondary | ICD-10-CM | POA: Diagnosis not present

## 2021-10-14 DIAGNOSIS — E785 Hyperlipidemia, unspecified: Secondary | ICD-10-CM | POA: Diagnosis not present

## 2021-10-14 DIAGNOSIS — R0789 Other chest pain: Secondary | ICD-10-CM | POA: Diagnosis not present

## 2021-10-14 DIAGNOSIS — Z833 Family history of diabetes mellitus: Secondary | ICD-10-CM | POA: Diagnosis not present

## 2021-10-14 DIAGNOSIS — R6520 Severe sepsis without septic shock: Secondary | ICD-10-CM | POA: Diagnosis present

## 2021-10-14 DIAGNOSIS — R1111 Vomiting without nausea: Secondary | ICD-10-CM | POA: Diagnosis not present

## 2021-10-14 DIAGNOSIS — J439 Emphysema, unspecified: Secondary | ICD-10-CM | POA: Diagnosis not present

## 2021-10-14 DIAGNOSIS — J939 Pneumothorax, unspecified: Secondary | ICD-10-CM | POA: Diagnosis not present

## 2021-10-14 DIAGNOSIS — Y95 Nosocomial condition: Secondary | ICD-10-CM | POA: Diagnosis present

## 2021-10-14 DIAGNOSIS — R110 Nausea: Secondary | ICD-10-CM | POA: Diagnosis not present

## 2021-10-14 DIAGNOSIS — K219 Gastro-esophageal reflux disease without esophagitis: Secondary | ICD-10-CM | POA: Diagnosis not present

## 2021-10-14 DIAGNOSIS — I700 Atherosclerosis of aorta: Secondary | ICD-10-CM | POA: Diagnosis not present

## 2021-10-14 DIAGNOSIS — I110 Hypertensive heart disease with heart failure: Secondary | ICD-10-CM | POA: Diagnosis present

## 2021-10-14 DIAGNOSIS — Z20822 Contact with and (suspected) exposure to covid-19: Secondary | ICD-10-CM | POA: Insufficient documentation

## 2021-10-14 DIAGNOSIS — Z951 Presence of aortocoronary bypass graft: Secondary | ICD-10-CM | POA: Diagnosis not present

## 2021-10-14 DIAGNOSIS — N400 Enlarged prostate without lower urinary tract symptoms: Secondary | ICD-10-CM | POA: Diagnosis not present

## 2021-10-14 DIAGNOSIS — R109 Unspecified abdominal pain: Secondary | ICD-10-CM | POA: Diagnosis not present

## 2021-10-14 DIAGNOSIS — J441 Chronic obstructive pulmonary disease with (acute) exacerbation: Secondary | ICD-10-CM | POA: Diagnosis not present

## 2021-10-14 DIAGNOSIS — R0689 Other abnormalities of breathing: Secondary | ICD-10-CM | POA: Diagnosis not present

## 2021-10-14 DIAGNOSIS — I509 Heart failure, unspecified: Secondary | ICD-10-CM | POA: Insufficient documentation

## 2021-10-14 DIAGNOSIS — R079 Chest pain, unspecified: Secondary | ICD-10-CM | POA: Diagnosis not present

## 2021-10-14 DIAGNOSIS — J9601 Acute respiratory failure with hypoxia: Secondary | ICD-10-CM | POA: Diagnosis not present

## 2021-10-14 DIAGNOSIS — M6281 Muscle weakness (generalized): Secondary | ICD-10-CM | POA: Diagnosis not present

## 2021-10-14 DIAGNOSIS — E872 Acidosis, unspecified: Secondary | ICD-10-CM | POA: Diagnosis present

## 2021-10-14 DIAGNOSIS — K5900 Constipation, unspecified: Secondary | ICD-10-CM | POA: Diagnosis not present

## 2021-10-14 DIAGNOSIS — A419 Sepsis, unspecified organism: Secondary | ICD-10-CM | POA: Diagnosis not present

## 2021-10-14 DIAGNOSIS — I5032 Chronic diastolic (congestive) heart failure: Secondary | ICD-10-CM | POA: Diagnosis not present

## 2021-10-14 HISTORY — DX: Benign prostatic hyperplasia without lower urinary tract symptoms: N40.0

## 2021-10-14 HISTORY — DX: Hyperlipidemia, unspecified: E78.5

## 2021-10-14 HISTORY — DX: Heart failure, unspecified: I50.9

## 2021-10-14 LAB — COMPREHENSIVE METABOLIC PANEL
ALT: 31 U/L (ref 0–44)
AST: 24 U/L (ref 15–41)
Albumin: 3.8 g/dL (ref 3.5–5.0)
Alkaline Phosphatase: 47 U/L (ref 38–126)
Anion gap: 8 (ref 5–15)
BUN: 28 mg/dL — ABNORMAL HIGH (ref 8–23)
CO2: 23 mmol/L (ref 22–32)
Calcium: 8.8 mg/dL — ABNORMAL LOW (ref 8.9–10.3)
Chloride: 107 mmol/L (ref 98–111)
Creatinine, Ser: 1.3 mg/dL — ABNORMAL HIGH (ref 0.61–1.24)
GFR, Estimated: 56 mL/min — ABNORMAL LOW (ref >60)
Glucose, Bld: 84 mg/dL (ref 70–99)
Potassium: 3.4 mmol/L — ABNORMAL LOW (ref 3.5–5.1)
Sodium: 138 mmol/L (ref 135–145)
Total Bilirubin: 1.7 mg/dL — ABNORMAL HIGH (ref 0.3–1.2)
Total Protein: 6.8 g/dL (ref 6.5–8.1)

## 2021-10-14 LAB — CBC WITH DIFFERENTIAL/PLATELET
Abs Immature Granulocytes: 0.04 10*3/uL (ref 0.00–0.07)
Basophils Absolute: 0.1 10*3/uL (ref 0.0–0.1)
Basophils Relative: 1 %
Eosinophils Absolute: 0.3 10*3/uL (ref 0.0–0.5)
Eosinophils Relative: 3 %
HCT: 50 % (ref 39.0–52.0)
Hemoglobin: 16 g/dL (ref 13.0–17.0)
Immature Granulocytes: 1 %
Lymphocytes Relative: 27 %
Lymphs Abs: 2.1 10*3/uL (ref 0.7–4.0)
MCH: 30.1 pg (ref 26.0–34.0)
MCHC: 32 g/dL (ref 30.0–36.0)
MCV: 94.2 fL (ref 80.0–100.0)
Monocytes Absolute: 0.7 10*3/uL (ref 0.1–1.0)
Monocytes Relative: 9 %
Neutro Abs: 4.7 10*3/uL (ref 1.7–7.7)
Neutrophils Relative %: 59 %
Platelets: 286 10*3/uL (ref 150–400)
RBC: 5.31 MIL/uL (ref 4.22–5.81)
RDW: 13.7 % (ref 11.5–15.5)
WBC: 7.9 10*3/uL (ref 4.0–10.5)
nRBC: 0 % (ref 0.0–0.2)

## 2021-10-14 LAB — RESP PANEL BY RT-PCR (FLU A&B, COVID) ARPGX2
Influenza A by PCR: NEGATIVE
Influenza B by PCR: NEGATIVE
SARS Coronavirus 2 by RT PCR: NEGATIVE

## 2021-10-14 LAB — TROPONIN I (HIGH SENSITIVITY)
Troponin I (High Sensitivity): 3 ng/L (ref <18)
Troponin I (High Sensitivity): 4 ng/L (ref <18)

## 2021-10-14 LAB — BRAIN NATRIURETIC PEPTIDE: B Natriuretic Peptide: 252 pg/mL — ABNORMAL HIGH (ref 0.0–100.0)

## 2021-10-14 LAB — D-DIMER, QUANTITATIVE: D-Dimer, Quant: 0.46 ug/mL-FEU (ref 0.00–0.50)

## 2021-10-14 MED ORDER — MORPHINE SULFATE (PF) 2 MG/ML IV SOLN
2.0000 mg | Freq: Once | INTRAVENOUS | Status: AC
  Administered 2021-10-14: 2 mg via INTRAVENOUS
  Filled 2021-10-14: qty 1

## 2021-10-14 MED ORDER — AZITHROMYCIN 250 MG PO TABS: ORAL_TABLET | ORAL | 0 refills | Status: DC

## 2021-10-14 NOTE — ED Triage Notes (Addendum)
Pt brought in by [facility] from [facility] with c/o chest pain and left forearm tingling that started about 30 minutes ago. Pt given 324 ASA and 1 Nitro by EMS. Pt reports the pain went from a 10 to a 6 with the Nitro.

## 2021-10-14 NOTE — ED Provider Notes (Signed)
[place] Provider Note   CSN: [id number] Arrival date & time: 10/14/21  1143     History  Chief Complaint  Patient presents with   Chest Pain    Benjamin [name] is a 81 y.o. male.  HPI  81 year old male with past medical history of HTN, HLD, CAD, CHF, COPD presents emergency department with midsternal chest heaviness and hypoxia.  Patient was just recently admitted for hypoxia secondary to heart failure exacerbation.  He was discharged 2 days ago.  States he briefly felt well but then his symptoms started after that including fatigue.  About 30 minutes prior to arrival he developed midsternal chest heaviness.  EMS gave him aspirin and nitro.  EMS reported that this improve the chest pain however the patient denies this.  The pain does not radiate to his back or down into his abdomen.  He denies any recent cough, hemoptysis or swelling of his lower extremities.  Home Medications Prior to Admission medications   Medication Sig Start Date End Date Taking? Authorizing Provider  acetaminophen (TYLENOL) 500 MG tablet Take 1,000 mg by mouth every 6 (six) hours as needed.    [provider]  acetaminophen (TYLENOL) 650 MG suppository Place 1 suppository (650 mg total) rectally every 6 (six) hours as needed for mild pain (or Fever >/= 101). 10/12/21   Phillips Grout, MD  colestipol (COLESTID) 1 g tablet Take 2 g by mouth 2 (two) times daily. 06/30/20   [provider]  CRANBERRY EXTRACT PO Take 400 mg by mouth 2 (two) times daily.    [provider]  Ipratropium-Albuterol (COMBIVENT) 20-100 MCG/ACT AERS respimat Inhale 1 puff into the lungs every 4 (four) hours as needed for wheezing or shortness of breath. 08/27/21   Mariel Aloe, MD  ipratropium-albuterol (DUONEB) 0.5-2.5 (3) MG/3ML SOLN Take 3 mLs by nebulization every 4 (four) hours as needed. 08/28/21   Noreene Larsson, NP  metoprolol tartrate (LOPRESSOR) 25 MG tablet Take 0.5 tablets (12.5 mg  total) by mouth 2 (two) times daily. 09/24/21   Satira Sark, MD  pantoprazole (PROTONIX) 40 MG tablet Take 80 mg by mouth 2 (two) times daily. 11/21/19   [provider]  REPATHA SURECLICK 465 MG/ML SOAJ INJECT 140MG  SUBCUTANEOUSLY EVERY 14 DAYS AS DIRECTED BY MD Patient taking differently: Inject 140 mg into the skin every 14 (fourteen) days. Inject on the 1st and 15th of every month 12/15/20   Pixie Casino, MD  tamsulosin (FLOMAX) 0.4 MG CAPS capsule TAKE 2 CAPSULES BY MOUTH ONCE DAILY AFTER SUPPER FOR PROSTATE Patient taking differently: Take 0.8 mg by mouth daily. TAKE 2 CAPSULES BY MOUTH ONCE DAILY AFTER SUPPER FOR PROSTATE 01/25/21   Noreene Larsson, NP      Allergies    Arsenic, Contrast media [iodinated contrast media], and Statins    Review of Systems   Review of Systems  Constitutional:  Positive for fatigue. Negative for fever.  Respiratory:  Positive for chest tightness and shortness of breath.   Cardiovascular:  Positive for chest pain. Negative for palpitations and leg swelling.  Gastrointestinal:  Negative for abdominal pain, diarrhea and vomiting.  Genitourinary:  Negative for flank pain.  Musculoskeletal:  Negative for back pain.  Skin:  Negative for rash.  Neurological:  Negative for headaches.   Physical Exam Updated Vital Signs BP 100/67    Pulse (!) 113    Temp 98.2 F (36.8 C) (Axillary)    Resp 19  Ht 6' (1.829 m)    Wt 82.6 kg    SpO2 91%    BMI 24.68 kg/m  Physical Exam Vitals and nursing note reviewed.  Constitutional:      General: He is not in acute distress.    Appearance: Normal appearance.  HENT:     Head: Normocephalic.     Mouth/Throat:     Mouth: Mucous membranes are moist.  Cardiovascular:     Rate and Rhythm: Tachycardia present.  Pulmonary:     Effort: Pulmonary effort is normal. No respiratory distress.     Breath sounds: Examination of the right-lower field reveals decreased breath sounds and rales. Examination of the  left-lower field reveals decreased breath sounds and rales. Decreased breath sounds and rales present.  Abdominal:     Palpations: Abdomen is soft.     Tenderness: There is no abdominal tenderness.  Musculoskeletal:     Right lower leg: No edema.     Left lower leg: No edema.  Skin:    General: Skin is warm.  Neurological:     Mental Status: He is alert and oriented to person, place, and time. Mental status is at baseline.  Psychiatric:        Mood and Affect: Mood normal.    ED Results / Procedures / Treatments   Labs (all labs ordered are listed, but only abnormal results are displayed) Labs Reviewed  BRAIN NATRIURETIC PEPTIDE - Abnormal; Notable for the following components:      Result Value   B Natriuretic Peptide 252.0 (*)    All other components within normal limits  RESP PANEL BY RT-PCR (FLU A&B, COVID) ARPGX2  CBC WITH DIFFERENTIAL/PLATELET  COMPREHENSIVE METABOLIC PANEL  TROPONIN I (HIGH SENSITIVITY)    EKG EKG Interpretation  Date/Time:  Sunday October 14 2021 11:47:28 EST Ventricular Rate:  118 PR Interval:  159 QRS Duration: 90 QT Interval:  332 QTC Calculation: 466 R Axis:   88 Text Interpretation: Sinus tachycardia Consider right atrial enlargement Low voltage, precordial leads Consider right ventricular hypertrophy Confirmed by Orlandis Sanden ([id number]) on 10/14/2021 12:03:35 PM  Radiology DG Chest Port 1 View  Result Date: 10/14/2021 CLINICAL DATA:  Chest pain EXAM: PORTABLE CHEST 1 VIEW COMPARISON:  10/10/2021 FINDINGS: Heart size within normal limits. Aortic atherosclerosis. Streaky bibasilar opacities, left greater than right, slightly increased from prior. No pleural effusion or pneumothorax. IMPRESSION: Streaky bibasilar opacities, slightly increased from prior. Findings may represent atelectasis or infection. Electronically Signed   By: Nicholas  Plundo D.O.   On: 10/14/2021 12:24   ECHOCARDIOGRAM COMPLETE  Result Date: 10/12/2021    ECHOCARDIOGRAM  REPORT   Patient Name:   Benjamin [name] Date of Exam: 10/12/2021 Medical Rec #:  [id number]  Height:       71.0 in Accession #:    [id number] Weight:       189.6 lb Date of Birth:  05/20/1941   BSA:          2.061 m Patient Age:    80 years   BP:           142/72 mmHg Patient Gender: M          HR:           54  bpm. Exam Location:  [facility] Procedure: 2D Echo, Cardiac Doppler and Color Doppler Indications:    R06.02 SOB  History:        Patient has prior history of Echocardiogram examinations, most  recent 01/21/2020. CHF, CAD, Prior CABG; Signs/Symptoms:Shortness                 of Breath.  Sonographer:    Roseanna Rainbow RDCS Referring Phys: [phone] RACHAL A DAVID  Sonographer Comments: Technically difficult study due to poor echo windows. IMPRESSIONS  1. Left ventricular ejection fraction, by estimation, is 60 to 65%. The left ventricle has normal function. The left ventricle has no regional wall motion abnormalities. Left ventricular diastolic parameters are consistent with Grade I diastolic dysfunction (impaired relaxation).  2. Right ventricular systolic function is normal. The right ventricular size is normal. Tricuspid regurgitation signal is inadequate for assessing PA pressure.  3. The mitral valve is grossly normal. Trivial mitral valve regurgitation.  4. The aortic valve is tricuspid. Aortic valve regurgitation is not visualized. Aortic valve sclerosis/calcification is present, without any evidence of aortic stenosis.  5. Aortic dilatation noted. There is borderline dilatation of the aortic root, measuring 39 mm.  6. Prominent epicardial fat layer. Comparison(s): Prior images reviewed side by side. LVEF remains normal at 60-65% with mild diastolic dysfunction. FINDINGS  Left Ventricle: Left ventricular ejection fraction, by estimation, is 60 to 65%. The left ventricle has normal function. The left ventricle has no regional wall motion abnormalities. The left ventricular internal cavity size was normal in  size. There is  no left ventricular hypertrophy. Left ventricular diastolic parameters are consistent with Grade I diastolic dysfunction (impaired relaxation). Right Ventricle: The right ventricular size is normal. No increase in right ventricular wall thickness. Right ventricular systolic function is normal. Tricuspid regurgitation signal is inadequate for assessing PA pressure. Left Atrium: Left atrial size was normal in size. Right Atrium: Right atrial size was normal in size. Pericardium: There is no evidence of pericardial effusion. Presence of epicardial fat layer. Mitral Valve: The mitral valve is grossly normal. Mild mitral annular calcification. Trivial mitral valve regurgitation. Tricuspid Valve: The tricuspid valve is grossly normal. Tricuspid valve regurgitation is trivial. Aortic Valve: The aortic valve is tricuspid. There is mild aortic valve annular calcification. Aortic valve regurgitation is not visualized. Aortic valve sclerosis/calcification is present, without any evidence of aortic stenosis. Pulmonic Valve: The pulmonic valve was grossly normal. Pulmonic valve regurgitation is trivial. Aorta: Aortic dilatation noted. There is borderline dilatation of the aortic root, measuring 39 mm. IAS/Shunts: The interatrial septum was not well visualized.  LEFT VENTRICLE PLAX 2D LVIDd:         3.80 cm     Diastology LVIDs:         2.20 cm     LV e' medial:    5.00 cm/s LV PW:         1.00 cm     LV E/e' medial:  11.5 LV IVS:        1.00 cm     LV e' lateral:   6.96 cm/s LVOT diam:     2.00 cm     LV E/e' lateral: 8.3 LV SV:         45 LV SV Index:   22 LVOT Area:     3.14 cm  LV Volumes (MOD) LV vol d, MOD A2C: 30.1 ml LV vol d, MOD A4C: 30.2 ml LV vol s, MOD A2C: 9.8 ml LV vol s, MOD A4C: 7.2 ml LV SV MOD A2C:     20.3 ml LV SV MOD A4C:     30.2 ml LV SV MOD BP:      21.5 ml RIGHT VENTRICLE RV S  prime:     4.68 cm/s TAPSE (M-mode): 0.7 cm LEFT ATRIUM             Index        RIGHT ATRIUM          Index LA  diam:        3.30 cm 1.60 cm/m   RA Area:     6.65 cm LA Vol (A2C):   22.5 ml 10.92 ml/m  RA Volume:   11.60 ml 5.63 ml/m LA Vol (A4C):   23.0 ml 11.16 ml/m LA Biplane Vol: 24.3 ml 11.79 ml/m  AORTIC VALVE LVOT Vmax:   87.50 cm/s LVOT Vmean:  54.500 cm/s LVOT VTI:    0.143 m  AORTA Ao Root diam: 3.90 cm Ao Asc diam:  3.40 cm MITRAL VALVE MV Area (PHT): 2.69 cm    SHUNTS MV Decel Time: 282 msec    Systemic VTI:  0.14 m MV E velocity: 57.75 cm/s  Systemic Diam: 2.00 cm MV A velocity: 80.75 cm/s MV E/A ratio:  0.72 Rozann Lesches MD Electronically signed by Rozann Lesches MD Signature Date/Time: 10/12/2021/2:58:49 PM    Final     Procedures Procedures    Medications Ordered in ED Medications  morphine 2 MG/ML injection 2 mg (2 mg Intravenous Given 10/14/21 1219)    ED Course/ Medical Decision Making/ A&P                           Medical Decision Making Amount and/or Complexity of Data Reviewed Labs: ordered. Radiology: ordered.  Risk Prescription drug management.   This patient presents to the ED for concern of chest heaviness/shortness of breath, this involves an extensive number of treatment options, and is a complaint that carries with it a high risk of complications and morbidity.  The differential diagnosis includes pneumonia, CHF, PE, ACS   Additional history obtained: -Additional history obtained from EMS -External records from outside source obtained and reviewed including: Chart review including previous notes, labs, imaging, consultation notes   Lab Tests: -I ordered, reviewed, and interpreted labs.  The pertinent results include: Negative troponins, baseline lab findings, stable unchanged BNP, negative D-dimer   EKG -Sinus rhythm, no acute ischemic changes   Imaging Studies ordered: -I ordered imaging studies including chest x-ray -I independently visualized and interpreted imaging which showed bibasilar streaky opacities, slightly increased from previous  concern for possible infection, no heart failure/edema -I agree with the radiologist interpretation   Medicines ordered and prescription drug management: -I ordered medication including morphine for chest discomfort -Reevaluation of the patient after these medicines showed that the patient resolved -I have reviewed the patients home medicines and have made adjustments as needed   ED Course: 81 year old male presents emergency department chest heaviness and shortness of breath.  Recent admission for CHF and hypoxia, full cardiac workup including ECHO yesterday. Results pending.  Does not wear supplemental oxygen at home.  Patient got aspirin and nitro in route, slight chest pain on arrival, resolved with morphine.  Chest x-ray shows no findings of heart failure but streaky bibasilar opacities concerning for possible infection.  BNP is stable, D-dimer is negative, troponins are negative.  Low suspicion for ACS, D-dimer ruled out dissection/PE.  Flu and COVID swab is pending.  On reevaluation patient is chest pain-free.  Low suspicion for ACS.  He has normal oxygenation on room air.  He is already at a rehab facility, just had an echo yesterday and  follow-up with cardiology.  We will place him on antibiotics for possible respiratory infection and plan for outpatient follow-up through his rehab/cardiology.   Cardiac Monitoring: The patient was maintained on a cardiac monitor.  I personally viewed and interpreted the cardiac monitored which showed an underlying rhythm of: Sinus rhythm   Reevaluation: After the interventions noted above, I reevaluated the patient and found that they have :resolved   Dispostion: Patient at this time appears safe and stable for discharge and close outpatient follow up. Discharge plan and strict return to ED precautions discussed, patient verbalizes understanding and agreement.        Final Clinical Impression(s) / ED Diagnoses Final diagnoses:  None    Rx  / DC Orders ED Discharge Orders     None         Lorelle Gibbs, DO 10/14/21 1524

## 2021-10-14 NOTE — Discharge Instructions (Addendum)
You have been seen and discharged from the emergency department.  Your heart work-up was normal.  Chest x-ray shows findings of possible infection which could be your source of shortness of breath/chest heaviness.  Take antibiotic as directed.  Follow-up with your primary provider for further evaluation and further care. Take home medications as prescribed. If you have any worsening symptoms or further concerns for your health please return to an emergency department for further evaluation.

## 2021-10-15 ENCOUNTER — Encounter: Payer: Self-pay | Admitting: Adult Health

## 2021-10-15 ENCOUNTER — Non-Acute Institutional Stay (SKILLED_NURSING_FACILITY): Payer: Medicare Other | Admitting: Adult Health

## 2021-10-15 DIAGNOSIS — J441 Chronic obstructive pulmonary disease with (acute) exacerbation: Secondary | ICD-10-CM

## 2021-10-15 DIAGNOSIS — N400 Enlarged prostate without lower urinary tract symptoms: Secondary | ICD-10-CM

## 2021-10-15 DIAGNOSIS — E785 Hyperlipidemia, unspecified: Secondary | ICD-10-CM | POA: Diagnosis not present

## 2021-10-15 DIAGNOSIS — J9601 Acute respiratory failure with hypoxia: Secondary | ICD-10-CM

## 2021-10-15 DIAGNOSIS — I2510 Atherosclerotic heart disease of native coronary artery without angina pectoris: Secondary | ICD-10-CM

## 2021-10-15 DIAGNOSIS — K219 Gastro-esophageal reflux disease without esophagitis: Secondary | ICD-10-CM | POA: Diagnosis not present

## 2021-10-15 DIAGNOSIS — I700 Atherosclerosis of aorta: Secondary | ICD-10-CM | POA: Diagnosis not present

## 2021-10-15 DIAGNOSIS — M6281 Muscle weakness (generalized): Secondary | ICD-10-CM

## 2021-10-15 DIAGNOSIS — I5032 Chronic diastolic (congestive) heart failure: Secondary | ICD-10-CM | POA: Diagnosis not present
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
## 2021-10-16 ENCOUNTER — Encounter: Payer: Self-pay | Admitting: Internal Medicine

## 2021-10-16 ENCOUNTER — Non-Acute Institutional Stay (SKILLED_NURSING_FACILITY): Payer: Medicare Other | Admitting: Internal Medicine

## 2021-10-16 DIAGNOSIS — I5032 Chronic diastolic (congestive) heart failure: Secondary | ICD-10-CM

## 2021-10-16 DIAGNOSIS — J9601 Acute respiratory failure with hypoxia: Secondary | ICD-10-CM | POA: Diagnosis not present

## 2021-10-16 DIAGNOSIS — M6281 Muscle weakness (generalized): Secondary | ICD-10-CM | POA: Diagnosis not present
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
## 2021-10-16 NOTE — Assessment & Plan Note (Addendum)
Respiratory status remains compromised; O2 sats 90% on 3L/min. Consider steroid burst if symptoms progressive.  Pulmonary consult indicated because of persistent and progressive ill-defined lower lobe opacities in the context of moderately advanced COPD.  Atelectasis versus aspiration questioned.

## 2021-10-16 NOTE — Assessment & Plan Note (Addendum)
Any CHF clinically compensated; no NVD, basilar rales, or peripheral edema. No change in present cardiac regimen indicated.

## 2021-10-16 NOTE — Patient Instructions (Signed)
See assessment and plan under each diagnosis in the problem list and acutely for this visit 

## 2021-10-16 NOTE — Assessment & Plan Note (Addendum)
Check CK, CRP, sed rate, TSH. Although not anemic , B12 was 227 on 07/06/2020.B12 will be updated also. If the studies are negative or normal; consider evaluation for myasthenia gravis.  Clinically Guillain-Barr associated with COVID seems very unlikely,  as with his compromised state he would expect to have presented with frank respiratory failure and become ventilator dependent.

## 2021-10-17 ENCOUNTER — Emergency Department (HOSPITAL_COMMUNITY): Payer: Medicare Other

## 2021-10-17 ENCOUNTER — Other Ambulatory Visit: Payer: Self-pay

## 2021-10-17 ENCOUNTER — Ambulatory Visit: Payer: Medicare Other | Admitting: Internal Medicine

## 2021-10-17 ENCOUNTER — Encounter: Payer: Self-pay | Admitting: Adult Health

## 2021-10-17 ENCOUNTER — Non-Acute Institutional Stay (SKILLED_NURSING_FACILITY): Payer: Medicare Other | Admitting: Adult Health

## 2021-10-17 ENCOUNTER — Encounter (HOSPITAL_COMMUNITY): Payer: Self-pay

## 2021-10-17 ENCOUNTER — Inpatient Hospital Stay (HOSPITAL_COMMUNITY)
Admission: EM | Admit: 2021-10-17 | Discharge: 2021-10-19 | DRG: 871 | Disposition: A | Discharge status: Home Health | Payer: Medicare Other | Source: Skilled Nursing Facility | Attending: Family Medicine | Admitting: Family Medicine

## 2021-10-17 DIAGNOSIS — Z833 Family history of diabetes mellitus: Secondary | ICD-10-CM | POA: Diagnosis not present

## 2021-10-17 DIAGNOSIS — Z8249 Family history of ischemic heart disease and other diseases of the circulatory system: Secondary | ICD-10-CM

## 2021-10-17 DIAGNOSIS — R6520 Severe sepsis without septic shock: Secondary | ICD-10-CM | POA: Diagnosis present

## 2021-10-17 DIAGNOSIS — J439 Emphysema, unspecified: Secondary | ICD-10-CM | POA: Diagnosis not present

## 2021-10-17 DIAGNOSIS — J189 Pneumonia, unspecified organism: Secondary | ICD-10-CM | POA: Diagnosis present

## 2021-10-17 DIAGNOSIS — R079 Chest pain, unspecified: Secondary | ICD-10-CM | POA: Diagnosis not present

## 2021-10-17 DIAGNOSIS — I700 Atherosclerosis of aorta: Secondary | ICD-10-CM | POA: Diagnosis not present

## 2021-10-17 DIAGNOSIS — Z8701 Personal history of pneumonia (recurrent): Secondary | ICD-10-CM

## 2021-10-17 DIAGNOSIS — J9601 Acute respiratory failure with hypoxia: Secondary | ICD-10-CM | POA: Diagnosis present

## 2021-10-17 DIAGNOSIS — K5900 Constipation, unspecified: Secondary | ICD-10-CM | POA: Diagnosis present

## 2021-10-17 DIAGNOSIS — J449 Chronic obstructive pulmonary disease, unspecified: Secondary | ICD-10-CM | POA: Diagnosis present

## 2021-10-17 DIAGNOSIS — I110 Hypertensive heart disease with heart failure: Secondary | ICD-10-CM | POA: Diagnosis present

## 2021-10-17 DIAGNOSIS — Z8616 Personal history of COVID-19: Secondary | ICD-10-CM | POA: Diagnosis not present

## 2021-10-17 DIAGNOSIS — N400 Enlarged prostate without lower urinary tract symptoms: Secondary | ICD-10-CM | POA: Diagnosis present

## 2021-10-17 DIAGNOSIS — K219 Gastro-esophageal reflux disease without esophagitis: Secondary | ICD-10-CM | POA: Diagnosis present

## 2021-10-17 DIAGNOSIS — R110 Nausea: Secondary | ICD-10-CM | POA: Diagnosis not present

## 2021-10-17 DIAGNOSIS — R1111 Vomiting without nausea: Secondary | ICD-10-CM | POA: Diagnosis not present

## 2021-10-17 DIAGNOSIS — Z9049 Acquired absence of other specified parts of digestive tract: Secondary | ICD-10-CM | POA: Diagnosis not present

## 2021-10-17 DIAGNOSIS — I2510 Atherosclerotic heart disease of native coronary artery without angina pectoris: Secondary | ICD-10-CM | POA: Diagnosis present

## 2021-10-17 DIAGNOSIS — R109 Unspecified abdominal pain: Secondary | ICD-10-CM | POA: Diagnosis not present

## 2021-10-17 DIAGNOSIS — I5032 Chronic diastolic (congestive) heart failure: Secondary | ICD-10-CM

## 2021-10-17 DIAGNOSIS — Z951 Presence of aortocoronary bypass graft: Secondary | ICD-10-CM | POA: Diagnosis not present

## 2021-10-17 DIAGNOSIS — A419 Sepsis, unspecified organism: Principal | ICD-10-CM | POA: Diagnosis present

## 2021-10-17 DIAGNOSIS — Z79899 Other long term (current) drug therapy: Secondary | ICD-10-CM | POA: Diagnosis not present

## 2021-10-17 DIAGNOSIS — Z87891 Personal history of nicotine dependence: Secondary | ICD-10-CM

## 2021-10-17 DIAGNOSIS — J939 Pneumothorax, unspecified: Secondary | ICD-10-CM | POA: Diagnosis not present

## 2021-10-17 DIAGNOSIS — E872 Acidosis, unspecified: Secondary | ICD-10-CM | POA: Diagnosis present

## 2021-10-17 DIAGNOSIS — Y95 Nosocomial condition: Secondary | ICD-10-CM | POA: Diagnosis present

## 2021-10-17 DIAGNOSIS — Z85828 Personal history of other malignant neoplasm of skin: Secondary | ICD-10-CM

## 2021-10-17 DIAGNOSIS — R112 Nausea with vomiting, unspecified: Secondary | ICD-10-CM | POA: Diagnosis not present

## 2021-10-17 DIAGNOSIS — E785 Hyperlipidemia, unspecified: Secondary | ICD-10-CM | POA: Diagnosis present

## 2021-10-17 DIAGNOSIS — J4489 Other specified chronic obstructive pulmonary disease: Secondary | ICD-10-CM | POA: Diagnosis present

## 2021-10-17 DIAGNOSIS — R000 Tachycardia, unspecified: Secondary | ICD-10-CM | POA: Diagnosis not present

## 2021-10-17 HISTORY — DX: Personal history of pneumonia (recurrent): Z87.01

## 2021-10-17 HISTORY — DX: Pneumonia, unspecified organism: J18.9

## 2021-10-17 LAB — COMPREHENSIVE METABOLIC PANEL
ALT: 39 U/L (ref 0–44)
AST: 29 U/L (ref 15–41)
Albumin: 4.1 g/dL (ref 3.5–5.0)
Alkaline Phosphatase: 55 U/L (ref 38–126)
Anion gap: 11 (ref 5–15)
BUN: UNDETERMINED mg/dL (ref 8–23)
CO2: 22 mmol/L (ref 22–32)
Calcium: 9 mg/dL (ref 8.9–10.3)
Chloride: 107 mmol/L (ref 98–111)
Creatinine, Ser: UNDETERMINED mg/dL (ref 0.61–1.24)
Glucose, Bld: 159 mg/dL — ABNORMAL HIGH (ref 70–99)
Potassium: 4.3 mmol/L (ref 3.5–5.1)
Sodium: 140 mmol/L (ref 135–145)
Total Bilirubin: 2.6 mg/dL — ABNORMAL HIGH (ref 0.3–1.2)
Total Protein: 7 g/dL (ref 6.5–8.1)

## 2021-10-17 LAB — CBC WITH DIFFERENTIAL/PLATELET
Abs Immature Granulocytes: 0.05 10*3/uL (ref 0.00–0.07)
Basophils Absolute: 0 10*3/uL (ref 0.0–0.1)
Basophils Relative: 0 %
Eosinophils Absolute: 0.1 10*3/uL (ref 0.0–0.5)
Eosinophils Relative: 1 %
HCT: 54.2 % — ABNORMAL HIGH (ref 39.0–52.0)
Hemoglobin: 17.8 g/dL — ABNORMAL HIGH (ref 13.0–17.0)
Immature Granulocytes: 0 %
Lymphocytes Relative: 2 %
Lymphs Abs: 0.3 10*3/uL — ABNORMAL LOW (ref 0.7–4.0)
MCH: 31.6 pg (ref 26.0–34.0)
MCHC: 32.8 g/dL (ref 30.0–36.0)
MCV: 96.3 fL (ref 80.0–100.0)
Monocytes Absolute: 0.4 10*3/uL (ref 0.1–1.0)
Monocytes Relative: 3 %
Neutro Abs: 12 10*3/uL — ABNORMAL HIGH (ref 1.7–7.7)
Neutrophils Relative %: 94 %
Platelets: 235 10*3/uL (ref 150–400)
RBC: 5.63 MIL/uL (ref 4.22–5.81)
RDW: 13.7 % (ref 11.5–15.5)
WBC: 12.9 10*3/uL — ABNORMAL HIGH (ref 4.0–10.5)
nRBC: 0 % (ref 0.0–0.2)

## 2021-10-17 LAB — TROPONIN I (HIGH SENSITIVITY)
Troponin I (High Sensitivity): 3 ng/L (ref <18)
Troponin I (High Sensitivity): 5 ng/L (ref <18)

## 2021-10-17 LAB — RESP PANEL BY RT-PCR (FLU A&B, COVID) ARPGX2
Influenza A by PCR: NEGATIVE
Influenza B by PCR: NEGATIVE
SARS Coronavirus 2 by RT PCR: NEGATIVE

## 2021-10-17 LAB — BUN: BUN: 25 mg/dL — ABNORMAL HIGH (ref 8–23)

## 2021-10-17 LAB — BASIC METABOLIC PANEL
Anion gap: 8 (ref 5–15)
BUN: 25 mg/dL — ABNORMAL HIGH (ref 8–23)
CO2: 23 mmol/L (ref 22–32)
Calcium: 8.4 mg/dL — ABNORMAL LOW (ref 8.9–10.3)
Chloride: 103 mmol/L (ref 98–111)
Creatinine, Ser: 1.16 mg/dL (ref 0.61–1.24)
GFR, Estimated: >60 mL/min (ref >60)
Glucose, Bld: 137 mg/dL — ABNORMAL HIGH (ref 70–99)
Potassium: 4.2 mmol/L (ref 3.5–5.1)
Sodium: 134 mmol/L — ABNORMAL LOW (ref 135–145)

## 2021-10-17 LAB — LIPASE, BLOOD: Lipase: 35 U/L (ref 11–51)

## 2021-10-17 LAB — URINALYSIS, ROUTINE W REFLEX MICROSCOPIC
Bilirubin Urine: NEGATIVE
Glucose, UA: NEGATIVE mg/dL
Ketones, ur: NEGATIVE mg/dL
Leukocytes,Ua: NEGATIVE
Nitrite: NEGATIVE
Protein, ur: NEGATIVE mg/dL
Specific Gravity, Urine: >1.03 — ABNORMAL HIGH (ref 1.005–1.030)
pH: 5.5 (ref 5.0–8.0)

## 2021-10-17 LAB — CBG MONITORING, ED: Glucose-Capillary: 149 mg/dL — ABNORMAL HIGH (ref 70–99)

## 2021-10-17 LAB — URINALYSIS, MICROSCOPIC (REFLEX)

## 2021-10-17 LAB — D-DIMER, QUANTITATIVE: D-Dimer, Quant: 0.92 ug/mL-FEU — ABNORMAL HIGH (ref 0.00–0.50)

## 2021-10-17 LAB — LACTIC ACID, PLASMA: Lactic Acid, Venous: 2.8 mmol/L (ref 0.5–1.9)

## 2021-10-17 LAB — CREATININE, SERUM
Creatinine, Ser: 1.12 mg/dL (ref 0.61–1.24)
GFR, Estimated: >60 mL/min (ref >60)

## 2021-10-17 LAB — MAGNESIUM: Magnesium: 2 mg/dL (ref 1.7–2.4)

## 2021-10-17 MED ORDER — LACTATED RINGERS IV BOLUS
500.0000 mL | Freq: Once | INTRAVENOUS | Status: AC
  Administered 2021-10-17: 500 mL via INTRAVENOUS

## 2021-10-17 MED ORDER — TAMSULOSIN HCL 0.4 MG PO CAPS
0.4000 mg | ORAL_CAPSULE | Freq: Every day | ORAL | Status: DC
  Administered 2021-10-17 – 2021-10-18 (×2): 0.4 mg via ORAL
  Filled 2021-10-17 (×2): qty 1

## 2021-10-17 MED ORDER — METHYLPREDNISOLONE SODIUM SUCC 125 MG IJ SOLR
120.0000 mg | INTRAMUSCULAR | Status: DC
  Administered 2021-10-17: 120 mg via INTRAVENOUS
  Filled 2021-10-17: qty 2

## 2021-10-17 MED ORDER — VANCOMYCIN HCL 1250 MG/250ML IV SOLN
1250.0000 mg | INTRAVENOUS | Status: DC
  Administered 2021-10-18: 1250 mg via INTRAVENOUS
  Filled 2021-10-17: qty 250

## 2021-10-17 MED ORDER — ONDANSETRON HCL 4 MG/2ML IJ SOLN: 4.0000 mg | Freq: Four times a day (QID) | INTRAMUSCULAR | Status: DC | PRN

## 2021-10-17 MED ORDER — ACETAMINOPHEN 325 MG PO TABS
650.0000 mg | ORAL_TABLET | Freq: Four times a day (QID) | ORAL | Status: DC | PRN
  Administered 2021-10-17 – 2021-10-18 (×2): 650 mg via ORAL
  Filled 2021-10-17 (×2): qty 2

## 2021-10-17 MED ORDER — METOPROLOL TARTRATE 25 MG PO TABS
12.5000 mg | ORAL_TABLET | Freq: Two times a day (BID) | ORAL | Status: DC
  Administered 2021-10-19: 12.5 mg via ORAL
  Filled 2021-10-17 (×3): qty 1

## 2021-10-17 MED ORDER — SODIUM CHLORIDE 0.9 % IV SOLN
2.0000 g | Freq: Two times a day (BID) | INTRAVENOUS | Status: DC
  Administered 2021-10-18: 2 g via INTRAVENOUS
  Filled 2021-10-17: qty 2

## 2021-10-17 MED ORDER — ENOXAPARIN SODIUM 40 MG/0.4ML IJ SOSY
40.0000 mg | PREFILLED_SYRINGE | INTRAMUSCULAR | Status: DC
  Administered 2021-10-18 – 2021-10-19 (×2): 40 mg via SUBCUTANEOUS
  Filled 2021-10-17 (×2): qty 0.4

## 2021-10-17 MED ORDER — POLYETHYLENE GLYCOL 3350 17 G PO PACK
34.0000 g | PACK | Freq: Two times a day (BID) | ORAL | Status: AC
  Administered 2021-10-17: 34 g via ORAL
  Filled 2021-10-17 (×3): qty 2

## 2021-10-17 MED ORDER — IPRATROPIUM-ALBUTEROL 0.5-2.5 (3) MG/3ML IN SOLN: 3.0000 mL | RESPIRATORY_TRACT | Status: DC | PRN

## 2021-10-17 MED ORDER — SENNOSIDES-DOCUSATE SODIUM 8.6-50 MG PO TABS
2.0000 | ORAL_TABLET | Freq: Two times a day (BID) | ORAL | Status: DC
  Administered 2021-10-17 – 2021-10-19 (×4): 2 via ORAL
  Filled 2021-10-17 (×4): qty 2

## 2021-10-17 MED ORDER — VANCOMYCIN HCL 1500 MG/300ML IV SOLN
1500.0000 mg | Freq: Once | INTRAVENOUS | Status: AC
  Administered 2021-10-17: 1500 mg via INTRAVENOUS
  Filled 2021-10-17: qty 300

## 2021-10-17 MED ORDER — SODIUM CHLORIDE 0.9 % IV SOLN
1.0000 g | Freq: Once | INTRAVENOUS | Status: AC
  Administered 2021-10-17: 1 g via INTRAVENOUS
  Filled 2021-10-17 (×2): qty 1

## 2021-10-17 MED ORDER — METOCLOPRAMIDE HCL 5 MG/ML IJ SOLN
10.0000 mg | Freq: Once | INTRAMUSCULAR | Status: AC
  Administered 2021-10-17: 10 mg via INTRAVENOUS
  Filled 2021-10-17: qty 2

## 2021-10-17 MED ORDER — HYDROMORPHONE HCL 1 MG/ML IJ SOLN
0.5000 mg | Freq: Once | INTRAMUSCULAR | Status: AC
  Administered 2021-10-17: 0.5 mg via INTRAVENOUS
  Filled 2021-10-17: qty 1

## 2021-10-17 MED ORDER — PANTOPRAZOLE SODIUM 40 MG PO TBEC
80.0000 mg | DELAYED_RELEASE_TABLET | Freq: Two times a day (BID) | ORAL | Status: DC
  Administered 2021-10-17: 80 mg via ORAL
  Filled 2021-10-17: qty 2

## 2021-10-17 MED ORDER — GUAIFENESIN ER 600 MG PO TB12
600.0000 mg | ORAL_TABLET | Freq: Two times a day (BID) | ORAL | Status: DC
  Administered 2021-10-17 – 2021-10-19 (×4): 600 mg via ORAL
  Filled 2021-10-17 (×5): qty 1

## 2021-10-17 NOTE — ED Notes (Signed)
Patient is eating dinner

## 2021-10-17 NOTE — ED Notes (Signed)
Patient uses the urinal to void. Patient has voided twice since 1900. Patient did not complain of any pain while  I completed the  bladder scan.
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
## 2021-10-17 NOTE — ED Notes (Signed)
Patient refused blood work. Unable to collect lactic at this time.
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
## 2021-10-17 NOTE — ED Notes (Signed)
Heating packs given to pt to help keep warm

## 2021-10-17 NOTE — Progress Notes (Signed)
Pharmacy Antibiotic Note  Benjamin [name] is a 81 y.o. male admitted on 10/17/2021 with pneumonia.  Pharmacy has been consulted for vancomycin dosing.  Plan: Vancomycin 1500mg  x1 followed by vancomycin 1250mg  IV Q24h (eAUC 401, Scr 1.16, goal AUC 400-550)   Height: 6\' 1"  (185.4 cm) Weight: 82 kg (180 lb 12.4 oz) IBW/kg (Calculated) : 79.9  Temp (24hrs), Avg:98.4 F (36.9 C), Min:98 F (36.7 C), Max:98.8 F (37.1 C)  Recent Labs  Lab 10/11/21 0552 10/14/21 1203 10/17/21 1301 10/17/21 1437 10/17/21 1908  WBC 5.0 7.9 12.9*  --   --   CREATININE 1.21 1.30* QUANTITY NOT SUFFICIENT, UNABLE TO PERFORM TEST 1.12 1.16    Estimated Creatinine Clearance: 58.4 mL/min (by C-G formula based on SCr of 1.16 mg/dL).    Allergies  Allergen Reactions   Arsenic Swelling    Severe swelling if patient comes in contact    Contrast Media [Iodinated Contrast Media] Swelling and Rash   Statins Rash    Joint pain   Microbiology results: 2/01 BCx: pending   Thank you for allowing pharmacy to be a part of this patients care.  Ardyth Harps, PharmD Clinical Pharmacist

## 2021-10-17 NOTE — ED Notes (Signed)
Hospitalist in room with patient

## 2021-10-17 NOTE — ED Provider Notes (Signed)
4:15 PM-checkout from Dr. Doren Custard to evaluate patient after CT imaging.  He complains of belly pain and was noted to be tachycardic today.  He is receiving fluids for suspected dehydration.  He is on oxygen.  He was recently hospitalized and went to the nursing care facility, 5 days ago.    EKG Interpretation  Date/Time:  Wednesday October 17 2021 12:12:42 EST Ventricular Rate:  124 PR Interval:  155 QRS Duration: 94 QT Interval:  319 QTC Calculation: 459 R Axis:   104 Text Interpretation: Sinus tachycardia Low voltage, precordial leads Consider RVH w/ secondary repol abnormality Confirmed by Godfrey Pick [phone]) on 10/17/2021 12:41:52 PM        6:40 PM-CT scan indicates right-sided pneumonia.  This likely rests as a healthcare associated pneumonia, with chronic debilitated state.  This probably explains his abdominal discomfort as well with decreased oral intake leading to dehydration.  Will check lactate and blood cultures and start empiric antibiotics.  D-dimer is elevated however initial labs, do not report renal function.  We will also check to bmet, to assess his renal function prior to completing assessment evaluation.  He will likely require hospitalization.   9:15 PM-Bmet returned, potassium and creatinine are normal.  BUN slightly elevated.  Medications have been started to treat for healthcare associated pneumonia.  9:19 PM-Consult complete with hospitalist. Patient case explained and discussed.  He agrees to admit patient for further evaluation and treatment. Call ended at 9:27 PM   Daleen Bo, MD 10/17/21 2127

## 2021-10-17 NOTE — ED Triage Notes (Signed)
Pt bib ems from [facility] for n/v and shivering.  No fever by [facility] or ed.  02 sats by ems 86.  Pt is on 3lpm via [place], but fingers were cold.   Reports abd pain that started this morning.   Resp even and unlabored.  Skin warm and dry.  Nad.

## 2021-10-17 NOTE — ED Notes (Signed)
Report given to [facility]. Patient is ready for transport.

## 2021-10-17 NOTE — ED Provider Notes (Signed)
[facility] EMERGENCY DEPARTMENT Provider Note   CSN: [id number] Arrival date & time: 10/17/21  1208     History  Chief Complaint  Patient presents with   Emesis    Benjamin [name] is a 81 y.o. male.   Emesis Associated symptoms: abdominal pain and chills   Patient presents for emesis.  Medical history is notable for COPD, chronic pain, CAD s/p CABG, prediabetes, CHF, and BPH.  He had a recent hospital admission for respiratory failure with hypoxia.  He was discharged 5 days ago to [facility] for rehab.  He has been on 3 L of supplemental oxygen.  He was seen in the ED 3 days ago for chest heaviness.  Today he reports that he had onset of nausea and mid abdominal pain at 3 AM.  At 7 AM, he had onset of vomiting.  Vomiting has been persistent and for this reason, patient presents to the ED.  Currently he endorses nausea, fatigue, and chills.  EMS did give Zofran prior to arrival.    Home Medications Prior to Admission medications   Medication Sig Start Date End Date Taking? Authorizing Provider  acetaminophen (TYLENOL) 500 MG tablet Take 1,000 mg by mouth every 8 (eight) hours as needed for mild pain.   Yes [provider]  colestipol (COLESTID) 1 g tablet Take 2 g by mouth daily. 06/30/20  Yes [provider]  CRANBERRY EXTRACT PO Take 400 mg by mouth 2 (two) times daily.   Yes [provider]  furosemide (LASIX) 20 MG tablet Take 20 mg by mouth daily.   Yes [provider]  Ipratropium-Albuterol (COMBIVENT) 20-100 MCG/ACT AERS respimat Inhale 1 puff into the lungs every 4 (four) hours as needed for wheezing or shortness of breath. 08/27/21  Yes Mariel Aloe, MD  ipratropium-albuterol (DUONEB) 0.5-2.5 (3) MG/3ML SOLN Take 3 mLs by nebulization every 4 (four) hours as needed. Patient taking differently: Take 3 mLs by nebulization every 4 (four) hours as needed. 08/28/21  Yes Noreene Larsson, NP  metoprolol tartrate (LOPRESSOR) 25 MG tablet Take 0.5  tablets (12.5 mg total) by mouth 2 (two) times daily. 09/24/21  Yes Satira Sark, MD  pantoprazole (PROTONIX) 40 MG tablet Take 80 mg by mouth 2 (two) times daily. 11/21/19  Yes [provider]  REPATHA SURECLICK 102 MG/ML SOAJ INJECT 140MG  SUBCUTANEOUSLY EVERY 14 DAYS AS DIRECTED BY MD 12/15/20  Yes Hilty, Nadean Corwin, MD  tamsulosin (FLOMAX) 0.4 MG CAPS capsule TAKE 2 CAPSULES BY MOUTH ONCE DAILY AFTER SUPPER FOR PROSTATE 01/25/21  Yes Noreene Larsson, NP      Allergies    Arsenic, Contrast media [iodinated contrast media], and Statins    Review of Systems   Review of Systems  Constitutional:  Positive for chills and fatigue.  Gastrointestinal:  Positive for abdominal pain, nausea and vomiting.  All other systems reviewed and are negative.  Physical Exam Updated Vital Signs BP 112/65 (BP Location: Right Arm)    Pulse 64    Temp 98.2 F (36.8 C) (Oral)    Resp 14    Ht 6' (1.829 m)    Wt 82 kg    SpO2 92%    BMI 24.52 kg/m  Physical Exam Constitutional:      General: He is not in acute distress.    Appearance: Normal appearance. He is normal weight. He is not toxic-appearing or diaphoretic.     Interventions: Nasal cannula in place.  HENT:  Head: Normocephalic and atraumatic.     Right Ear: External ear normal.     Left Ear: External ear normal.     Nose: Nose normal. No congestion or rhinorrhea.     Mouth/Throat:     Mouth: Mucous membranes are dry.     Pharynx: Oropharynx is clear.  Eyes:     General: No scleral icterus.    Extraocular Movements: Extraocular movements intact.  Cardiovascular:     Rate and Rhythm: Regular rhythm. Tachycardia present.  Pulmonary:     Effort: Tachypnea present. No respiratory distress or retractions.     Breath sounds: No wheezing or rales.  Abdominal:     Palpations: Abdomen is soft.     Tenderness: There is abdominal tenderness in the periumbilical area.  Musculoskeletal:        General: No swelling or deformity.     Cervical  back: Normal range of motion. No rigidity.  Skin:    General: Skin is warm and dry.  Neurological:     General: No focal deficit present.     Mental Status: He is alert and oriented to person, place, and time.     Cranial Nerves: No cranial nerve deficit.     Sensory: No sensory deficit.     Motor: No weakness.  Psychiatric:        Mood and Affect: Mood normal.        Behavior: Behavior normal.    ED Results / Procedures / Treatments   Labs (all labs ordered are listed, but only abnormal results are displayed) Labs Reviewed  COMPREHENSIVE METABOLIC PANEL - Abnormal; Notable for the following components:      Result Value   Glucose, Bld 159 (*)    Total Bilirubin 2.6 (*)    All other components within normal limits  CBC WITH DIFFERENTIAL/PLATELET - Abnormal; Notable for the following components:   WBC 12.9 (*)    Hemoglobin 17.8 (*)    HCT 54.2 (*)    Neutro Abs 12.0 (*)    Lymphs Abs 0.3 (*)    All other components within normal limits  URINALYSIS, ROUTINE W REFLEX MICROSCOPIC - Abnormal; Notable for the following components:   Specific Gravity, Urine >1.030 (*)    Hgb urine dipstick TRACE (*)    All other components within normal limits  BUN - Abnormal; Notable for the following components:   BUN 25 (*)    All other components within normal limits  D-DIMER, QUANTITATIVE - Abnormal; Notable for the following components:   D-Dimer, Quant 0.92 (*)    All other components within normal limits  URINALYSIS, MICROSCOPIC (REFLEX) - Abnormal; Notable for the following components:   Bacteria, UA RARE (*)    All other components within normal limits  LACTIC ACID, PLASMA - Abnormal; Notable for the following components:   Lactic Acid, Venous 2.8 (*)    All other components within normal limits  BASIC METABOLIC PANEL - Abnormal; Notable for the following components:   Sodium 134 (*)    Glucose, Bld 137 (*)    BUN 25 (*)    Calcium 8.4 (*)    All other components within normal  limits  LACTIC ACID, PLASMA - Abnormal; Notable for the following components:   Lactic Acid, Venous 2.0 (*)    All other components within normal limits  BASIC METABOLIC PANEL - Abnormal; Notable for the following components:   Glucose, Bld 156 (*)    BUN 25 (*)    Calcium  8.3 (*)    All other components within normal limits  CBC - Abnormal; Notable for the following components:   WBC 11.9 (*)    All other components within normal limits  LACTIC ACID, PLASMA - Abnormal; Notable for the following components:   Lactic Acid, Venous 4.4 (*)    All other components within normal limits  LACTIC ACID, PLASMA - Abnormal; Notable for the following components:   Lactic Acid, Venous 3.1 (*)    All other components within normal limits  LACTIC ACID, PLASMA - Abnormal; Notable for the following components:   Lactic Acid, Venous 3.0 (*)    All other components within normal limits  CBG MONITORING, ED - Abnormal; Notable for the following components:   Glucose-Capillary 149 (*)    All other components within normal limits  RESP PANEL BY RT-PCR (FLU A&B, COVID) [id number]  CULTURE, BLOOD (ROUTINE X 2)  CULTURE, BLOOD (ROUTINE X 2)  EXPECTORATED SPUTUM ASSESSMENT W GRAM STAIN, RFLX TO RESP C  MRSA NEXT GEN BY PCR, NASAL  LIPASE, BLOOD  MAGNESIUM  CREATININE, SERUM  PROCALCITONIN  LEGIONELLA PNEUMOPHILA SEROGP 1 UR AG  STREP PNEUMONIAE URINARY ANTIGEN  CBC WITH DIFFERENTIAL/PLATELET  COMPREHENSIVE METABOLIC PANEL  MAGNESIUM  PROCALCITONIN  TROPONIN I (HIGH SENSITIVITY)  TROPONIN I (HIGH SENSITIVITY)    EKG EKG Interpretation  Date/Time:  Wednesday October 17 2021 12:12:42 EST Ventricular Rate:  124 PR Interval:  155 QRS Duration: 94 QT Interval:  319 QTC Calculation: 459 R Axis:   104 Text Interpretation: Sinus tachycardia Low voltage, precordial leads Consider RVH w/ secondary repol abnormality Confirmed by Godfrey Pick [phone]) on 10/17/2021 12:41:52 PM  Radiology CT ABDOMEN PELVIS WO  CONTRAST  Result Date: 10/17/2021 CLINICAL DATA:  Nausea/vomiting Abdominal pain, acute, nonlocalized; Respiratory illness, nondiagnostic xray EXAM: CT CHEST, ABDOMEN AND PELVIS WITHOUT CONTRAST TECHNIQUE: Multidetector CT imaging of the chest, abdomen and pelvis was performed following the standard protocol without IV contrast. RADIATION DOSE REDUCTION: This exam was performed according to the departmental dose-optimization program which includes automated exposure control, adjustment of the mA and/or kV according to patient size and/or use of iterative reconstruction technique. COMPARISON:  CT chest 10/10/2021 FINDINGS: CT CHEST FINDINGS Cardiovascular: Normal heart size. No significant pericardial effusion. The thoracic aorta is normal in caliber. Severe atherosclerotic plaque of the thoracic aorta. At least 3 vessel coronary artery calcifications status post coronary artery bypass graft. Mediastinum/Nodes: No gross hilar adenopathy, noting limited sensitivity for the detection of hilar adenopathy on this noncontrast study. No enlarged mediastinal or axillary lymph nodes. Thyroid gland, trachea, and esophagus demonstrate no significant findings. Tiny hiatial hernia. Lungs/Pleura: Biapical mild paraseptal and mild to moderate centrilobular emphysematous changes. Similar-appearing biapical pleural/pulmonary scarring that is nodular-like (3:17). Interval development of right middle lobe peribronchovascular ground-glass airspace opacities. Slightly worsened bilateral lower lobe patchy opacities with diffuse bronchial wall thickening, right greater than left. No pulmonary nodule. No pulmonary mass. No pleural effusion. No pneumothorax. Musculoskeletal: No chest wall abnormality. No suspicious lytic or blastic osseous lesions. No acute displaced fracture. CT ABDOMEN PELVIS FINDINGS Hepatobiliary: No focal liver abnormality. No gallstones, gallbladder wall thickening, or pericholecystic fluid. No biliary dilatation.  Pancreas: No focal lesion. Normal pancreatic contour. No surrounding inflammatory changes. No main pancreatic ductal dilatation. Spleen: Normal in size without focal abnormality. Adrenals/Urinary Tract: No adrenal nodule bilaterally. Bilateral kidneys enhance symmetrically. No hydronephrosis. No hydroureter. The urinary bladder is unremarkable. Stomach/Bowel: Stomach is within normal limits. No evidence of bowel wall thickening or dilatation. Colonic  diverticulosis. Status post appendectomy. Vascular/Lymphatic: No abdominal aorta or iliac aneurysm. Severe atherosclerotic plaque of the aorta and its branches. No abdominal, pelvic, or inguinal lymphadenopathy. Reproductive: The prostate is enlarged measuring up to 6 cm. Other: No intraperitoneal free fluid. No intraperitoneal free gas. No organized fluid collection. Musculoskeletal: Tiny fat containing left inguinal hernia. No suspicious lytic or blastic osseous lesions. No acute displaced fracture. IMPRESSION: 1. Interval development of right middle lobe peribronchovascular vascular ground-glass airspace opacity suggestive of infection/inflammation. Bilateral lower lobe opacities could represent a combination of atelectasis versus infection/inflammation. 2. Colonic diverticulosis with no acute diverticulitis. 3. Tiny fat containing left inguinal hernia. 4. Aortic Atherosclerosis (ICD10-I70.0) and Emphysema (ICD10-J43.9). Electronically Signed   By: Iven Finn M.D.   On: 10/17/2021 16:34   CT Chest Wo Contrast  Result Date: 10/17/2021 CLINICAL DATA:  Nausea/vomiting Abdominal pain, acute, nonlocalized; Respiratory illness, nondiagnostic xray EXAM: CT CHEST, ABDOMEN AND PELVIS WITHOUT CONTRAST TECHNIQUE: Multidetector CT imaging of the chest, abdomen and pelvis was performed following the standard protocol without IV contrast. RADIATION DOSE REDUCTION: This exam was performed according to the departmental dose-optimization program which includes automated  exposure control, adjustment of the mA and/or kV according to patient size and/or use of iterative reconstruction technique. COMPARISON:  CT chest 10/10/2021 FINDINGS: CT CHEST FINDINGS Cardiovascular: Normal heart size. No significant pericardial effusion. The thoracic aorta is normal in caliber. Severe atherosclerotic plaque of the thoracic aorta. At least 3 vessel coronary artery calcifications status post coronary artery bypass graft. Mediastinum/Nodes: No gross hilar adenopathy, noting limited sensitivity for the detection of hilar adenopathy on this noncontrast study. No enlarged mediastinal or axillary lymph nodes. Thyroid gland, trachea, and esophagus demonstrate no significant findings. Tiny hiatial hernia. Lungs/Pleura: Biapical mild paraseptal and mild to moderate centrilobular emphysematous changes. Similar-appearing biapical pleural/pulmonary scarring that is nodular-like (3:17). Interval development of right middle lobe peribronchovascular ground-glass airspace opacities. Slightly worsened bilateral lower lobe patchy opacities with diffuse bronchial wall thickening, right greater than left. No pulmonary nodule. No pulmonary mass. No pleural effusion. No pneumothorax. Musculoskeletal: No chest wall abnormality. No suspicious lytic or blastic osseous lesions. No acute displaced fracture. CT ABDOMEN PELVIS FINDINGS Hepatobiliary: No focal liver abnormality. No gallstones, gallbladder wall thickening, or pericholecystic fluid. No biliary dilatation. Pancreas: No focal lesion. Normal pancreatic contour. No surrounding inflammatory changes. No main pancreatic ductal dilatation. Spleen: Normal in size without focal abnormality. Adrenals/Urinary Tract: No adrenal nodule bilaterally. Bilateral kidneys enhance symmetrically. No hydronephrosis. No hydroureter. The urinary bladder is unremarkable. Stomach/Bowel: Stomach is within normal limits. No evidence of bowel wall thickening or dilatation. Colonic  diverticulosis. Status post appendectomy. Vascular/Lymphatic: No abdominal aorta or iliac aneurysm. Severe atherosclerotic plaque of the aorta and its branches. No abdominal, pelvic, or inguinal lymphadenopathy. Reproductive: The prostate is enlarged measuring up to 6 cm. Other: No intraperitoneal free fluid. No intraperitoneal free gas. No organized fluid collection. Musculoskeletal: Tiny fat containing left inguinal hernia. No suspicious lytic or blastic osseous lesions. No acute displaced fracture. IMPRESSION: 1. Interval development of right middle lobe peribronchovascular vascular ground-glass airspace opacity suggestive of infection/inflammation. Bilateral lower lobe opacities could represent a combination of atelectasis versus infection/inflammation. 2. Colonic diverticulosis with no acute diverticulitis. 3. Tiny fat containing left inguinal hernia. 4. Aortic Atherosclerosis (ICD10-I70.0) and Emphysema (ICD10-J43.9). Electronically Signed   By: Iven Finn M.D.   On: 10/17/2021 16:34   DG Chest Portable 1 View  Result Date: 10/17/2021 CLINICAL DATA:  Emesis EXAM: PORTABLE CHEST 1 VIEW COMPARISON:  Chest x-ray dated  October 08, 2021 FINDINGS: Cardiac and mediastinal contours are unchanged. Unchanged mild bibasilar opacities. Possible small left pleural effusion. Of pneumothorax. IMPRESSION: 1. Unchanged bibasilar opacities. 2. Possible small left pleural effusion. Electronically Signed   By: Yetta Glassman M.D.   On: 10/17/2021 13:01   [place] EKG SITE RITE  Result Date: 10/18/2021 If [facility] image not attached, placement could not be confirmed due to current cardiac rhythm.   Procedures Procedures    Medications Ordered in ED Medications  vancomycin (VANCOREADY) IVPB 1250 mg/250 mL (has no administration in time range)  acetaminophen (TYLENOL) tablet 650 mg (650 mg Oral Given 10/18/21 1556)  metoprolol tartrate (LOPRESSOR) tablet 12.5 mg (12.5 mg Oral Patient Refused/Not Given 10/18/21 1053)   tamsulosin (FLOMAX) capsule 0.4 mg (0.4 mg Oral Given 10/18/21 1747)  ipratropium-albuterol (DUONEB) 0.5-2.5 (3) MG/3ML nebulizer solution 3 mL (has no administration in time range)  enoxaparin (LOVENOX) injection 40 mg (40 mg Subcutaneous Given 10/18/21 1053)  guaiFENesin (MUCINEX) 12 hr tablet 600 mg (600 mg Oral Given 10/18/21 1059)  ondansetron (ZOFRAN) injection 4 mg (has no administration in time range)  polyethylene glycol (MIRALAX / GLYCOLAX) packet 34 g (34 g Oral Patient Refused/Not Given 10/18/21 1100)  senna-docusate (Senokot-S) tablet 2 tablet (2 tablets Oral Given 10/18/21 1053)  methylPREDNISolone sodium succinate (SOLU-MEDROL) 40 mg/mL injection 40 mg (40 mg Intravenous Not Given 10/18/21 1000)  pantoprazole (PROTONIX) EC tablet 40 mg (40 mg Oral Given 10/18/21 1059)  ceFEPIme (MAXIPIME) 2 g in sodium chloride 0.9 % 100 mL IVPB (0 g Intravenous Stopped 10/18/21 1722)  sodium chloride flush (NS) 0.9 % injection 10-40 mL (has no administration in time range)  sodium chloride flush (NS) 0.9 % injection 10-40 mL (10 mLs Intracatheter Given 10/18/21 1527)  HYDROmorphone (DILAUDID) injection 0.5 mg (0.5 mg Intravenous Given 10/17/21 1304)  metoCLOPramide (REGLAN) injection 10 mg (10 mg Intravenous Given 10/17/21 1304)  lactated ringers bolus 500 mL (0 mLs Intravenous Stopped 10/17/21 1400)  ceFEPIme (MAXIPIME) 1 g in sodium chloride 0.9 % 100 mL IVPB (0 g Intravenous Stopped 10/17/21 2046)  vancomycin (VANCOREADY) IVPB 1500 mg/300 mL (1,500 mg Intravenous New Bag/Given 10/17/21 2055)  lactated ringers bolus 500 mL (500 mLs Intravenous New Bag/Given 10/18/21 0841)  lactated ringers bolus 1,000 mL (1,000 mLs Intravenous New Bag/Given 10/18/21 1528)    And  lactated ringers bolus 1,000 mL (1,000 mLs Intravenous New Bag/Given 10/18/21 1746)    And  lactated ringers bolus 500 mL (500 mLs Intravenous New Bag/Given 10/18/21 1855)    ED Course/ Medical Decision Making/ A&P                           Medical Decision  Making Amount and/or Complexity of Data Reviewed Labs: ordered. Radiology: ordered.  Risk Prescription drug management. Decision regarding hospitalization.   This patient presents to the ED for concern of vomiting, this involves an extensive number of treatment options, and is a complaint that carries with it a high risk of complications and morbidity.  The differential diagnosis includes ACS, PNA, SBO, enteritis, mesenteric ischemia, diverticulitis, gallbladder disease, pancreatitis.   Co morbidities that complicate the patient evaluation  COPD, chronic pain, CAD s/p CABG, prediabetes, CHF, and BPH   Additional history obtained:  Additional history obtained from patient's wife External records from outside source obtained and reviewed including EMR   Lab Tests:  I Ordered, and personally interpreted labs.  The pertinent results include:  Leukocytosis   Imaging  Studies ordered:  I ordered imaging studies including CT CAP  I independently visualized and interpreted imaging which showed (pending at time of signout I agree with the radiologist interpretation   Cardiac Monitoring:  The patient was maintained on a cardiac monitor.  I personally viewed and interpreted the cardiac monitored which showed an underlying rhythm of: sinus tachycardia   Medicines ordered and prescription drug management:  I ordered medication including IVF  for PO intolerance, reglan and dilaudid for symptomatic relief  Reevaluation of the patient after these medicines showed that the patient improved I have reviewed the patients home medicines and have made adjustments as needed   Test Considered:  Contrasted CT scans, deferred due to patient allergy  Problem List / ED Course:  81 year old male with history of COPD, chronic pain, CAD s/p CABG, prediabetes, CHF, and BPH, and a recent hospitalization and rehab facility placement presents for acute onset of nausea, vomiting and abdominal pain.  He is alert and oriented on arrival. Nausea improved following zofran given PTA. He continues to have abdominal pain and tenderness is present on exam. Vital signs are notable for tachycardia and tachypnea. He states that his breathing feels to be at his baseline. He is on his (now) baseline O2 of 3L. He is afebrile but is experiencing chills. IVF, reglan and dilaudid given. Diagnostic workup was initiated. On reassessment, patient endorses improved pain and nausea. HR remains elevated. Early results of workup show a leukocytosis. CT imaging was ordered. Contrast was avoided due to his listed allergy. Care of patient was signed out to oncoming ED provider.    Reevaluation:  After the interventions noted above, I reevaluated the patient and found that they have :improved   Social Determinants of Health:  Recent hospitalization   Dispostion:  After consideration of the diagnostic results and the patients response to treatment, I feel that the patent would benefit from reassessment.          Final Clinical Impression(s) / ED Diagnoses Final diagnoses:  Healthcare-associated pneumonia    Rx / DC Orders ED Discharge Orders     None         Godfrey Pick, MD 10/18/21 1911

## 2021-10-18 ENCOUNTER — Inpatient Hospital Stay: Payer: Self-pay

## 2021-10-18 DIAGNOSIS — I5032 Chronic diastolic (congestive) heart failure: Secondary | ICD-10-CM

## 2021-10-18 DIAGNOSIS — E785 Hyperlipidemia, unspecified: Secondary | ICD-10-CM | POA: Insufficient documentation

## 2021-10-18 DIAGNOSIS — K219 Gastro-esophageal reflux disease without esophagitis: Secondary | ICD-10-CM

## 2021-10-18 DIAGNOSIS — I2510 Atherosclerotic heart disease of native coronary artery without angina pectoris: Secondary | ICD-10-CM

## 2021-10-18 LAB — CBC
HCT: 44.7 % (ref 39.0–52.0)
Hemoglobin: 14.7 g/dL (ref 13.0–17.0)
MCH: 30.6 pg (ref 26.0–34.0)
MCHC: 32.9 g/dL (ref 30.0–36.0)
MCV: 93.1 fL (ref 80.0–100.0)
Platelets: 228 10*3/uL (ref 150–400)
RBC: 4.8 MIL/uL (ref 4.22–5.81)
RDW: 13.6 % (ref 11.5–15.5)
WBC: 11.9 10*3/uL — ABNORMAL HIGH (ref 4.0–10.5)
nRBC: 0 % (ref 0.0–0.2)

## 2021-10-18 LAB — BASIC METABOLIC PANEL
Anion gap: 7 (ref 5–15)
BUN: 25 mg/dL — ABNORMAL HIGH (ref 8–23)
CO2: 22 mmol/L (ref 22–32)
Calcium: 8.3 mg/dL — ABNORMAL LOW (ref 8.9–10.3)
Chloride: 106 mmol/L (ref 98–111)
Creatinine, Ser: 1 mg/dL (ref 0.61–1.24)
GFR, Estimated: >60 mL/min (ref >60)
Glucose, Bld: 156 mg/dL — ABNORMAL HIGH (ref 70–99)
Potassium: 4.3 mmol/L (ref 3.5–5.1)
Sodium: 135 mmol/L (ref 135–145)

## 2021-10-18 LAB — PROCALCITONIN: Procalcitonin: 1.49 ng/mL

## 2021-10-18 LAB — LACTIC ACID, PLASMA
Lactic Acid, Venous: 2 mmol/L (ref 0.5–1.9)
Lactic Acid, Venous: 4.4 mmol/L (ref 0.5–1.9)
Lactic Acid, Venous: 3.1 mmol/L (ref 0.5–1.9)
Lactic Acid, Venous: 3 mmol/L (ref 0.5–1.9)

## 2021-10-18 LAB — STREP PNEUMONIAE URINARY ANTIGEN: Strep Pneumo Urinary Antigen: NEGATIVE

## 2021-10-18 MED ORDER — LACTATED RINGERS IV BOLUS
500.0000 mL | Freq: Once | INTRAVENOUS | Status: AC
  Administered 2021-10-18: 500 mL via INTRAVENOUS

## 2021-10-18 MED ORDER — METHYLPREDNISOLONE SODIUM SUCC 125 MG IJ SOLR
60.0000 mg | Freq: Two times a day (BID) | INTRAMUSCULAR | Status: DC
Start: 2021-10-18 — End: 2021-10-18

## 2021-10-18 MED ORDER — METHYLPREDNISOLONE SODIUM SUCC 40 MG IJ SOLR
40.0000 mg | Freq: Two times a day (BID) | INTRAMUSCULAR | Status: DC
  Administered 2021-10-18 – 2021-10-19 (×2): 40 mg via INTRAVENOUS
  Filled 2021-10-18 (×3): qty 1

## 2021-10-18 MED ORDER — SODIUM CHLORIDE 0.9% FLUSH
10.0000 mL | Freq: Two times a day (BID) | INTRAVENOUS | Status: DC
  Administered 2021-10-18 – 2021-10-19 (×2): 10 mL

## 2021-10-18 MED ORDER — LACTATED RINGERS IV BOLUS (SEPSIS)
500.0000 mL | Freq: Once | INTRAVENOUS | Status: AC
  Administered 2021-10-18: 500 mL via INTRAVENOUS

## 2021-10-18 MED ORDER — SODIUM CHLORIDE 0.9% FLUSH
10.0000 mL | INTRAVENOUS | Status: DC | PRN
  Administered 2021-10-18: 10 mL

## 2021-10-18 MED ORDER — SODIUM CHLORIDE 0.9 % IV SOLN
2.0000 g | Freq: Three times a day (TID) | INTRAVENOUS | Status: DC
  Administered 2021-10-18 – 2021-10-19 (×3): 2 g via INTRAVENOUS
  Filled 2021-10-18 (×3): qty 2

## 2021-10-18 MED ORDER — PANTOPRAZOLE SODIUM 40 MG PO TBEC
40.0000 mg | DELAYED_RELEASE_TABLET | Freq: Every day | ORAL | Status: DC
  Administered 2021-10-18 – 2021-10-19 (×2): 40 mg via ORAL
  Filled 2021-10-18 (×2): qty 1

## 2021-10-18 MED ORDER — LACTATED RINGERS IV BOLUS (SEPSIS)
1000.0000 mL | Freq: Once | INTRAVENOUS | Status: AC
  Administered 2021-10-18: 1000 mL via INTRAVENOUS

## 2021-10-18 MED ORDER — IPRATROPIUM-ALBUTEROL 20-100 MCG/ACT IN AERS: 1.0000 | INHALATION_SPRAY | Freq: Four times a day (QID) | RESPIRATORY_TRACT | Status: DC

## 2021-10-18 NOTE — Plan of Care (Signed)
°  Problem: Acute Rehab OT Goals (only OT should resolve) Goal: Pt. Will Perform Eating Flowsheets (Taken 10/18/2021 0952) Pt Will Perform Eating:  with modified independence  sitting Goal: Pt. Will Perform Grooming Flowsheets (Taken 10/18/2021 0952) Pt Will Perform Grooming:  with modified independence  sitting Goal: Pt. Will Perform Lower Body Dressing Flowsheets (Taken 10/18/2021 0952) Pt Will Perform Lower Body Dressing:  Independently  sitting/lateral leans  sit to/from stand Goal: Pt. Will Transfer To Toilet Flowsheets (Taken 10/18/2021 [phone]) Pt Will Transfer to Toilet:  with min guard assist  stand pivot transfer Goal: Pt/Caregiver Will Perform Home Exercise Program Flowsheets (Taken 10/18/2021 [phone]) Pt/caregiver will Perform Home Exercise Program:  Increased ROM  Increased strength  Both right and left upper extremity  Independently  Benjamin [name] OT, MOT

## 2021-10-18 NOTE — Progress Notes (Signed)
Progress Note   Patient: Benjamin [name] UXL:[id number] DOB: 10-Mar-1941 DOA: 10/17/2021     1 DOS: the patient was seen and examined on 10/18/2021   Brief hospital course: 81 y.o. male, with past medical history of chronic diastolic CHF, COPD, CAD status post CABG, patient with recent hospitalization at [facility] from 10/10/2021 until 10/12/2021 due to acute respiratory failure with questionable pneumonia, patient was discharged to [facility] for subacute rehab. -Patient was sent from [facility] for evaluation for vomiting, patient had multiple episodes of nausea-vomiting, as well complaining of abdominal pain, from 7 to 11 AM which prompted them to send him to ED, patient reports some dyspnea which is around his baseline, he has new oxygen requirement since recent discharge, he reports some cough, he denies any fever, chest pain, diarrhea, but reports constipation last bowel movement was 10/09/2021, he denies any coughing or choking while eating, any aspiration. -In ED his work-up significant for lactic acid of 2.8, sodium of 134, creatinine of 1.1, white blood cell count of 12.9, CT chest significant for bilateral groundglass opacities at the bases of the lung with no significant change from recent CT last week, but he is having new right midlung infiltrate, CT abdomen and pelvis significant for diverticulosis, no diverticulitis, patient was started on broad-spectrum antibiotics and [facility] hospitalist consulted to admit.  Assessment and Plan: * HCAP (healthcare-associated pneumonia)- (present on admission) - Continue broad-spectrum coverage with plan to de-escalate antibiotics on 10/19/2021  Acute respiratory failure with hypoxia ([place])- (present on admission) -Patient was started on oxygen during recent admission and discharged to [facility], otherwise no oxygen requirement at baseline. -Still significant with bilateral basal multifocal opacities, and new right midlung opacity, does appear to be  having worsening for pneumonia, I think his bibasilar groundglass opacities most likely due to to his COVID infection last December, right lung pneumonia most likely due to bacterial pneumonia, healthcare associated pneumonia given recent hospitalization and SNF stay. -Continue low-dose Solu-Medrol to see if it helps with his oxygen requirement. -Continue IV cefepime, add vancomycin for HCAP, will narrow antibiotics when cultures available. Hopefully on 10/19/21.  -He was encouraged to use incentive spirometer and flutter valve. -SLP to evaluate for any silent aspiration given his right-sided pneumonia.  COPD GOLD 2- (present on admission) He has significant COPD and is followed closely by a pulmonologist Dr. Leonides Schanz -Continues to have dyspneic symptoms -Continue bronchodilators as ordered -Continue steroids as ordered (IV)   Chronic diastolic CHF (congestive heart failure) ([place])- (present on admission) Temporarily holding Lasix likely can resume on 10/19/2021   CAD (coronary artery disease)- (present on admission) - Stable no chest pain symptoms at this time resume home cardiac medications  S/P CABG (coronary artery bypass graft) - stable on current home cardiac medications   Constipation- (present on admission) - PRN laxatives ordered   BPH (benign prostatic hyperplasia)- (present on admission) -Continue with Flomax, check bladder scan  GERD (gastroesophageal reflux disease)- (present on admission) - Protonix ordered for GI protection.          Subjective: Pt complains of ongoing shortness of breath symptoms.    Physical Exam: Vitals:   10/18/21 0608 10/18/21 1317 10/18/21 1420 10/18/21 1540  BP: 103/68 111/66 110/64 103/64  Pulse: 92 68 74 60  Resp: 20 20 19 18   Temp: 97.8 F (36.6 C) 98.6 F (37 C) 97.8 F (36.6 C) (!) 97.5 F (36.4 C)  TempSrc: Oral Oral Oral Oral  SpO2: 90% 93% 91% 90%  Weight:  Height:       Physical Exam Vitals and nursing note reviewed.   Constitutional:      Appearance: Normal appearance.  HENT:     Head: Normocephalic and atraumatic.     Nose: Nose normal. No congestion or rhinorrhea.     Mouth/Throat:     Mouth: Mucous membranes are moist.  Eyes:     Pupils: Pupils are equal, round, and reactive to light.  Cardiovascular:     Rate and Rhythm: Normal rate.     Pulses: Normal pulses.     Heart sounds: Normal heart sounds.  Pulmonary:     Effort: No respiratory distress.     Breath sounds: No stridor. Wheezing and rhonchi present. No rales.  Chest:     Chest wall: No tenderness.  Abdominal:     General: Abdomen is flat. Bowel sounds are normal.     Palpations: Abdomen is soft.  Musculoskeletal:        General: Normal range of motion.     Cervical back: Normal range of motion and neck supple.  Skin:    General: Skin is warm.  Neurological:     General: No focal deficit present.     Mental Status: He is alert.  Psychiatric:        Mood and Affect: Mood normal.    Family Communication: none present during rounds   Disposition: Status is: Inpatient  Remains inpatient appropriate because: IV antibiotics ordered        Time spent: 36  minutes  Author: Irwin Brakeman, MD 10/18/2021 4:36 PM  For on call review [email or web address].

## 2021-10-18 NOTE — Assessment & Plan Note (Signed)
-   Protonix ordered for GI protection 

## 2021-10-18 NOTE — Sepsis Progress Note (Signed)
Notified provider to be sure to document reassessment of fluid volume status in their note.

## 2021-10-18 NOTE — Assessment & Plan Note (Signed)
He has significant COPD and is followed closely by a pulmonologist Dr. Leonides Schanz -Continues to have dyspneic symptoms -Continue bronchodilators as ordered -Continue steroids as ordered (IV)

## 2021-10-18 NOTE — Sepsis Progress Note (Addendum)
eLink is following this Code Sepsis.  Noted blood cultures drawn last evening, 10/17/2021 at 1908.

## 2021-10-18 NOTE — Progress Notes (Signed)

## 2021-10-18 NOTE — Assessment & Plan Note (Signed)
-  Continue with Flomax, check bladder scan

## 2021-10-18 NOTE — Assessment & Plan Note (Signed)
-  Patient was started on oxygen during recent admission and discharged to [facility], otherwise no oxygen requirement at baseline. -Still significant with bilateral basal multifocal opacities, and new right midlung opacity, does appear to be having worsening for pneumonia, I think his bibasilar groundglass opacities most likely due to to his COVID infection last December, right lung pneumonia most likely due to bacterial pneumonia, healthcare associated pneumonia given recent hospitalization and SNF stay. -Continue low-dose Solu-Medrol to see if it helps with his oxygen requirement. -Continue IV cefepime, add vancomycin for HCAP, will narrow antibiotics when cultures available. Hopefully on 10/19/21.  -He was encouraged to use incentive spirometer and flutter valve. -SLP to evaluate for any silent aspiration given his right-sided pneumonia.

## 2021-10-18 NOTE — Assessment & Plan Note (Signed)
Temporarily holding Lasix likely can resume on 10/19/2021

## 2021-10-18 NOTE — Assessment & Plan Note (Signed)
-   PRN laxatives ordered
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
## 2021-10-18 NOTE — Assessment & Plan Note (Signed)
-   Continue broad-spectrum coverage with plan to de-escalate antibiotics on 10/19/2021

## 2021-10-18 NOTE — Progress Notes (Signed)
Sepsis - Repeat Assessment  Performed at:    5:14 PM   Vitals     Blood pressure 103/64, pulse 60, temperature (!) 97.5 F (36.4 C), temperature source Oral, resp. rate 18, height 6' (1.829 m), weight 82 kg, SpO2 90 %.  Heart:     Regular rate and rhythm  Lungs:    CTA  Capillary Refill:   <2 sec  Peripheral Pulse:   Radial pulse palpable  Skin:     Normal Color

## 2021-10-18 NOTE — Progress Notes (Signed)
Date and time results received: 10/18/21 1355 Test: lactic acid  Critical Value: 4.4 Name of Provider Notified: Dr. Wynetta Emery Orders Received? Or Actions Taken?: awaiting new orders Deirdre Pippins, RN

## 2021-10-18 NOTE — Evaluation (Signed)
Occupational Therapy Evaluation Patient Details Name: Benjamin [name] MRN: [id number] DOB: 1941/02/17 Today's Date: 10/18/2021   History of Present Illness Benjamin [name]  is a 81 y.o. male, with past medical history of chronic diastolic CHF, COPD, CAD status post CABG, patient with recent hospitalization at [facility] from 10/10/2021 until 10/12/2021 due to acute respiratory failure with questionable pneumonia, patient was discharged to [facility] for subacute rehab.  -Patient was sent from [facility] for evaluation for vomiting, patient had multiple episodes of nausea-vomiting, as well complaining of abdominal pain, from 7 to 11 AM which prompted them to send him to ED, patient reports some dyspnea which is around his baseline, he has new oxygen requirement since recent discharge, he reports some cough, he denies any fever, chest pain, diarrhea, but reports constipation last bowel movement was 10/09/2021, he denies any coughing or choking while eating, any aspiration.  -In ED his work-up significant for lactic acid of 2.8, sodium of 134, creatinine of 1.1, white blood cell count of 12.9, CT chest significant for bilateral groundglass opacities at the bases of the lung with no significant change from recent CT last week, but he is having new right midlung infiltrate, CT abdomen and pelvis significant for diverticulosis, no diverticulitis, patient was started on broad-spectrum antibiotics and [facility] hospitalist consulted to admit.   Clinical Impression   Pt agreeable to OT evaluation. Pt is generally weak needed rest breaks between tasks. Pt demonstrates deficits in sitting and standing balance with very poor standing balance with RW needing mod to max A to remain upright with knees buckling. Squat pivot transfer used to [facility] and back. Pt would like to go home but is still very weak at this time. Pt will benefit from continued OT in the hospital and recommended venue below to increase strength, balance, and  endurance for safe ADL's.        Recommendations for follow up therapy are one component of a multi-disciplinary discharge planning process, led by the attending physician.  Recommendations may be updated based on patient status, additional functional criteria and insurance authorization.   Follow Up Recommendations  Skilled nursing-short term rehab (<3 hours/day)    Assistance Recommended at Discharge Intermittent Supervision/Assistance  Patient can return home with the following A lot of help with walking and/or transfers;A lot of help with bathing/dressing/bathroom;Assist for transportation;Help with stairs or ramp for entrance;Assistance with cooking/housework    Functional Status Assessment  Patient has had a recent decline in their functional status and demonstrates the ability to make significant improvements in function in a reasonable and predictable amount of time.  Equipment Recommendations  None recommended by OT    Recommendations for Other Services       Precautions / Restrictions Precautions Precautions: Fall Restrictions Weight Bearing Restrictions: No      Mobility Bed Mobility Overal bed mobility: Needs Assistance Bed Mobility: Supine to Sit, Sit to Supine     Supine to sit: HOB elevated, Min assist Sit to supine: Min guard, HOB elevated   General bed mobility comments: slow labored movemetn; single hand held assist for supine to sit    Transfers Overall transfer level: Needs assistance Equipment used: Rolling walker (2 wheels) Transfers: Sit to/from Stand, Bed to chair/wheelchair/BSC Sit to Stand: Mod assist, Max assist   Squat pivot transfers: Mod assist       General transfer comment: Pt very unsteady in standing with knees buckling; most safe transfer at this time is squat pivot with min to mod  A with RW.      Balance Overall balance assessment: Needs assistance Sitting-balance support: No upper extremity supported, Feet supported Sitting  balance-Leahy Scale: Fair Sitting balance - Comments: anterior loss of balance noted once; seated EOB   Standing balance support: Bilateral upper extremity supported, Reliant on assistive device for balance Standing balance-Leahy Scale: Poor                             ADL either performed or assessed with clinical judgement   ADL Overall ADL's : Needs assistance/impaired Eating/Feeding: Set up;Sitting   Grooming: Sitting;Min guard           Upper Body Dressing : Set up;Sitting   Lower Body Dressing: Supervision/safety;Min guard;Sitting/lateral leans Lower Body Dressing Details (indicate cue type and reason): Pt able to don socks with min G assist due to instances of slipping forward while seated at [employer] Transfer: Moderate assistance;Squat-pivot;BSC/3in1 Toilet Transfer Details (indicate cue type and reason): EOB to BSC without RW           General ADL Comments: Pt is very weak with deficits in seated and standing balance.     Vision Baseline Vision/History: 0 No visual deficits Ability to See in Adequate Light: 0 Adequate Patient Visual Report: No change from baseline Vision Assessment?: No apparent visual deficits     Perception     Praxis      Pertinent Vitals/Pain Pain Assessment Pain Assessment: No/denies pain     Hand Dominance Right   Extremity/Trunk Assessment Upper Extremity Assessment Upper Extremity Assessment: Generalized weakness   Lower Extremity Assessment Lower Extremity Assessment: Defer to PT evaluation   Cervical / Trunk Assessment Cervical / Trunk Assessment: Normal   Communication Communication Communication: HOH   Cognition Arousal/Alertness: Awake/alert Behavior During Therapy: WFL for tasks assessed/performed Overall Cognitive Status: Within Functional Limits for tasks assessed                                                        Home Living Family/patient expects to be discharged  to:: Skilled nursing facility Living Arrangements: Spouse/significant other Available Help at Discharge: Family;Available 24 hours/day Type of Home: House Home Access: Stairs to enter [employer] of Steps: 4-5 Entrance Stairs-Rails: Right;Left;Can reach both Home Layout: One level     Bathroom Shower/Tub: Occupational psychologist: Standard     Home Equipment: Conservation officer, nature (2 wheels);Cane - single point;Shower seat;BSC/3in1;Grab bars - toilet;Grab bars - tub/shower   Additional Comments: Pt came from [place] center but wants to return home.      Prior Functioning/Environment Prior Level of Function : Needs assist       Physical Assist : Mobility (physical);ADLs (physical) Mobility (physical): Bed mobility;Transfers;Gait ADLs (physical): Grooming;Bathing;Dressing;Toileting;Feeding;IADLs Mobility Comments: [facility] for mobility. ADLs Comments: Pt reports needing much assist for ADL's includnig needing food cut up.        OT Problem List: Decreased strength;Decreased range of motion;Decreased activity tolerance;Impaired balance (sitting and/or standing)      OT Treatment/Interventions: Self-care/ADL training;Therapeutic exercise;Patient/family education;Balance training;Therapeutic activities;DME and/or AE instruction    OT Goals(Current goals can be found in the care plan section) Acute Rehab OT Goals Patient Stated Goal: return home OT Goal Formulation: With patient Time For Goal  Achievement: 11/01/21 Potential to Achieve Goals: Fair  OT Frequency: Min 2X/week                                   End of Session Equipment Utilized During Treatment: Rolling walker (2 wheels);Oxygen  Activity Tolerance: Patient tolerated treatment well Patient left: in bed;with call bell/phone within reach  OT Visit Diagnosis: Unsteadiness on feet (R26.81);Muscle weakness (generalized) (M62.81);Other abnormalities of gait and  mobility (R26.89)                Time: 9150-5697 OT Time Calculation (min): 31 min Charges:  OT General Charges $OT Visit: 1 Visit OT Evaluation $OT Eval Moderate Complexity: 1 Mod  Arnelle Nale OT, MOT  Larey Seat 10/18/2021, 9:51 AM

## 2021-10-18 NOTE — Hospital Course (Signed)
81 y.o. male, with past medical history of chronic diastolic CHF, COPD, CAD status post CABG, patient with recent hospitalization at [facility] from 10/10/2021 until 10/12/2021 due to acute respiratory failure with questionable pneumonia, patient was discharged to [facility] for subacute rehab. -Patient was sent from [facility] for evaluation for vomiting, patient had multiple episodes of nausea-vomiting, as well complaining of abdominal pain, from 7 to 11 AM which prompted them to send him to ED, patient reports some dyspnea which is around his baseline, he has new oxygen requirement since recent discharge, he reports some cough, he denies any fever, chest pain, diarrhea, but reports constipation last bowel movement was 10/09/2021, he denies any coughing or choking while eating, any aspiration. -In ED his work-up significant for lactic acid of 2.8, sodium of 134, creatinine of 1.1, white blood cell count of 12.9, CT chest significant for bilateral groundglass opacities at the bases of the lung with no significant change from recent CT last week, but he is having new right midlung infiltrate, CT abdomen and pelvis significant for diverticulosis, no diverticulitis, patient was started on broad-spectrum antibiotics and [facility] hospitalist consulted to admit.

## 2021-10-18 NOTE — Assessment & Plan Note (Signed)
-   stable on current home cardiac medications

## 2021-10-18 NOTE — Assessment & Plan Note (Signed)
-   Stable no chest pain symptoms at this time resume home cardiac medications

## 2021-10-18 NOTE — TOC Initial Note (Addendum)
Transition of Care [facility]) - Initial/Assessment Note    Patient Details  Name: Benjamin [name] MRN: [id number] Date of Birth: 1941-06-30  [facility] of [facility]) CM/SW Contact:    Shade Flood, LCSW Phone Number: 10/18/2021, 1:15 PM  Clinical Narrative:                  Pt admitted from [facility] rehab where he has been for about a week since the time of his previous hospital dc. Updated Kerri at [facility]. They will accept pt back to rehab when stable for dc.  TOC will follow and assist with dc planning.  1545: Spoke with pt's wife to review dc planning. She states that if pt continues to need rehab at dc, they will return to [facility]. If he can go home, she will take him home with [facility].  Expected Discharge Plan: Skilled Nursing Facility Barriers to Discharge: Continued Medical Work up   Patient Goals and CMS Choice Patient states their goals for this hospitalization and ongoing recovery are:: return to SNF   Choice offered to / list presented to : Patient  Expected Discharge Plan and Services Expected Discharge Plan: [place] In-house Referral: Clinical Social Work   Post Acute Care Choice: Resumption of Svcs/PTA Provider Living arrangements for the past 2 months: Single Family Home, [place]                                      Prior Living Arrangements/Services Living arrangements for the past 2 months: Single Family Home, [place] Lives with:: Spouse Patient language and need for interpreter reviewed:: Yes Do you feel safe going back to the place where you live?: Yes      Need for Family Participation in Patient Care: Yes (Comment) Care giver support system in place?: Yes (comment) Current home services: DME Criminal Activity/Legal Involvement Pertinent to Current Situation/Hospitalization: No - Comment as needed  Activities of Daily Living Home Assistive Devices/Equipment: Wheelchair ADL Screening (condition at time of admission) Patient's  cognitive ability adequate to safely complete daily activities?: Yes Is the patient deaf or have difficulty hearing?: No Does the patient have difficulty seeing, even when wearing glasses/contacts?: No Does the patient have difficulty concentrating, remembering, or making decisions?: No Patient able to express need for assistance with ADLs?: Yes Does the patient have difficulty dressing or bathing?: Yes Independently performs ADLs?: No Communication: Independent Dressing (OT): Needs assistance Is this a change from baseline?: Pre-admission baseline Grooming: Needs assistance Is this a change from baseline?: Pre-admission baseline Feeding: Independent Bathing: Needs assistance Is this a change from baseline?: Pre-admission baseline Toileting: Needs assistance Is this a change from baseline?: Pre-admission baseline In/Out Bed: Needs assistance Is this a change from baseline?: Pre-admission baseline Walks in Home: Needs assistance Is this a change from baseline?: Pre-admission baseline Does the patient have difficulty walking or climbing stairs?: Yes Weakness of Legs: Both Weakness of Arms/Hands: None  Permission Sought/Granted                  Emotional Assessment Appearance:: Appears stated age Attitude/Demeanor/Rapport: Engaged Affect (typically observed): Pleasant Orientation: : Oriented to Self, Oriented to Place, Oriented to  Time, Oriented to Situation Alcohol / Substance Use: Not Applicable Psych Involvement: No (comment)  Admission diagnosis:  Healthcare-associated pneumonia [J18.9] HCAP (healthcare-associated pneumonia) [J18.9] Patient Active Problem List   Diagnosis Date Noted   Hyperlipidemia LDL goal <70 10/18/2021  HCAP (healthcare-associated pneumonia) 10/17/2021   Muscle weakness (generalized) 10/16/2021   Acute respiratory failure with hypoxia ([place]) 10/10/2021   General weakness 10/10/2021   COPD exacerbation ([place]) 08/25/2021   Right inguinal hernia     Hernia, umbilical 57/97/2820   Scrotal pain 04/13/2021   Exertional dyspnea 04/13/2021   Thrombocytopenia (HCC) 01/23/2020   Chronic allergic rhinitis 12/21/2019   Chronic pain syndrome 12/21/2019   Chronic insomnia 12/20/2019   AK (actinic keratosis) 03/17/2019   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   Coronary artery disease 03/01/2019   CAD (coronary artery disease) 02/05/2019   Aortic atherosclerosis ([place]) 09/22/2018   Prediabetes 09/22/2018   Chronic diastolic CHF (congestive heart failure) (HCC)    Irritable bowel syndrome with diarrhea    Coronary artery calcification seen on CAT scan    Chronic bilateral thoracic back pain 02/12/2017   GERD (gastroesophageal reflux disease) 11/27/2016   Constipation 10/28/2016   Erectile dysfunction 06/25/2016   Chronic diarrhea 06/07/2016   COPD GOLD 2 10/20/2015   DDD (degenerative disc disease), lumbar 01/11/2014   BPH (benign prostatic hyperplasia) 03/09/2012   Cataract 03/09/2012   PCP:  Lindell Spar, MD Pharmacy:   [facility] [place] - [address] [place] [address] Phone: [phone] Fax: [phone]     Social Determinants of Health (SDOH) Interventions    Readmission Risk Interventions Readmission Risk Prevention Plan 08/27/2021  Medication Screening Complete  Transportation Screening Complete  Some recent data might be hidden

## 2021-10-19 ENCOUNTER — Inpatient Hospital Stay (HOSPITAL_COMMUNITY): Payer: Medicare Other

## 2021-10-19 DIAGNOSIS — Z951 Presence of aortocoronary bypass graft: Secondary | ICD-10-CM

## 2021-10-19 DIAGNOSIS — J449 Chronic obstructive pulmonary disease, unspecified: Secondary | ICD-10-CM

## 2021-10-19 LAB — CBC WITH DIFFERENTIAL/PLATELET
Abs Immature Granulocytes: 0.07 10*3/uL (ref 0.00–0.07)
Basophils Absolute: 0 10*3/uL (ref 0.0–0.1)
Basophils Relative: 0 %
Eosinophils Absolute: 0 10*3/uL (ref 0.0–0.5)
Eosinophils Relative: 0 %
HCT: 39.9 % (ref 39.0–52.0)
Hemoglobin: 13.5 g/dL (ref 13.0–17.0)
Immature Granulocytes: 1 %
Lymphocytes Relative: 8 %
Lymphs Abs: 0.7 10*3/uL (ref 0.7–4.0)
MCH: 31.8 pg (ref 26.0–34.0)
MCHC: 33.8 g/dL (ref 30.0–36.0)
MCV: 94.1 fL (ref 80.0–100.0)
Monocytes Absolute: 0.3 10*3/uL (ref 0.1–1.0)
Monocytes Relative: 3 %
Neutro Abs: 7.9 10*3/uL — ABNORMAL HIGH (ref 1.7–7.7)
Neutrophils Relative %: 88 %
Platelets: 217 10*3/uL (ref 150–400)
RBC: 4.24 MIL/uL (ref 4.22–5.81)
RDW: 14 % (ref 11.5–15.5)
WBC: 9 10*3/uL (ref 4.0–10.5)
nRBC: 0 % (ref 0.0–0.2)

## 2021-10-19 LAB — COMPREHENSIVE METABOLIC PANEL
ALT: 32 U/L (ref 0–44)
AST: 23 U/L (ref 15–41)
Albumin: 3.2 g/dL — ABNORMAL LOW (ref 3.5–5.0)
Alkaline Phosphatase: 35 U/L — ABNORMAL LOW (ref 38–126)
Anion gap: 7 (ref 5–15)
BUN: 21 mg/dL (ref 8–23)
CO2: 24 mmol/L (ref 22–32)
Calcium: 8.4 mg/dL — ABNORMAL LOW (ref 8.9–10.3)
Chloride: 107 mmol/L (ref 98–111)
Creatinine, Ser: 0.96 mg/dL (ref 0.61–1.24)
GFR, Estimated: >60 mL/min (ref >60)
Glucose, Bld: 144 mg/dL — ABNORMAL HIGH (ref 70–99)
Potassium: 4.4 mmol/L (ref 3.5–5.1)
Sodium: 138 mmol/L (ref 135–145)
Total Bilirubin: 1.2 mg/dL (ref 0.3–1.2)
Total Protein: 5.8 g/dL — ABNORMAL LOW (ref 6.5–8.1)

## 2021-10-19 LAB — LEGIONELLA PNEUMOPHILA SEROGP 1 UR AG: L. pneumophila Serogp 1 Ur Ag: NEGATIVE

## 2021-10-19 LAB — LACTIC ACID, PLASMA: Lactic Acid, Venous: 1.4 mmol/L (ref 0.5–1.9)

## 2021-10-19 LAB — MAGNESIUM: Magnesium: 2 mg/dL (ref 1.7–2.4)

## 2021-10-19 LAB — PROCALCITONIN: Procalcitonin: 1.1 ng/mL

## 2021-10-19 MED ORDER — GUAIFENESIN ER 600 MG PO TB12: 600.0000 mg | ORAL_TABLET | Freq: Two times a day (BID) | ORAL | 0 refills | Status: AC

## 2021-10-19 MED ORDER — DOXYCYCLINE HYCLATE 100 MG PO CAPS
100.0000 mg | ORAL_CAPSULE | Freq: Two times a day (BID) | ORAL | 0 refills | Status: AC
Start: 2021-10-19 — End: 2021-10-24

## 2021-10-19 MED ORDER — POLYETHYLENE GLYCOL 3350 17 G PO PACK: 17.0000 g | PACK | Freq: Every day | ORAL | Status: DC

## 2021-10-19 MED ORDER — CRANBERRY EXTRACT 200 MG PO CAPS: 400.0000 mg | ORAL_CAPSULE | Freq: Two times a day (BID) | ORAL | Status: DC

## 2021-10-19 MED ORDER — TAMSULOSIN HCL 0.4 MG PO CAPS: 0.8000 mg | ORAL_CAPSULE | Freq: Every day | ORAL | Status: DC

## 2021-10-19 MED ORDER — COLESTIPOL HCL 1 G PO TABS
2.0000 g | ORAL_TABLET | Freq: Every day | ORAL | Status: DC
  Filled 2021-10-19 (×4): qty 2

## 2021-10-19 MED ORDER — FUROSEMIDE 20 MG PO TABS: 20.0000 mg | ORAL_TABLET | ORAL | Status: DC

## 2021-10-19 MED ORDER — PREDNISONE 20 MG PO TABS: ORAL_TABLET | ORAL | 0 refills | Status: DC

## 2021-10-19 NOTE — Progress Notes (Signed)
Pharmacy Antibiotic Note  Benjamin [name] is a 81 y.o. male admitted on 10/17/2021 with pneumonia.  Pharmacy has been consulted for Vancomycin dosing. Scr improved  Plan: Vancomycin 750 mg IV Q 12 hrs. Goal AUC 400-550. Expected AUC: 437 SCr used: 0.96 Also on Cefepime 2gm IV q8h F/U cxs and clinical progress Monitor V/S, labs and levels as indicated   Height: 6' (182.9 cm) Weight: 82 kg (180 lb 12.4 oz) IBW/kg (Calculated) : 77.6  Temp (24hrs), Avg:98.1 F (36.7 C), Min:97.5 F (36.4 C), Max:98.8 F (37.1 C)  Recent Labs  Lab 10/14/21 1203 10/17/21 1301 10/17/21 1437 10/17/21 1908 10/17/21 1911 10/18/21 0602 10/18/21 1219 10/18/21 1620 10/18/21 1836 10/19/21 0532 10/19/21 0755  WBC 7.9 12.9*  --   --   --  11.9*  --   --   --  9.0  --   CREATININE 1.30* QUANTITY NOT SUFFICIENT, UNABLE TO PERFORM TEST 1.12 1.16  --  1.00  --   --   --  0.96  --   LATICACIDVEN  --   --   --   --    < > 2.0* 4.4* 3.1* 3.0*  --  1.4   < > = values in this interval not displayed.    Estimated Creatinine Clearance: 68.5 mL/min (by C-G formula based on SCr of 0.96 mg/dL).    Allergies  Allergen Reactions   Arsenic Swelling    Severe swelling if patient comes in contact    Contrast Media [Iodinated Contrast Media] Swelling and Rash   Statins Rash    Joint pain    Antimicrobials this admission: Cefepime 2/1 >>  Vancomycin  2/1 >>   Microbiology results: 2/1 Bcx: ngtd 2/1 MRSA PCR: pending  Thank you for allowing pharmacy to be a part of this patients care.  [name], BS Pharm D, [place] Clinical Pharmacist Pager [phone] 10/19/2021 9:07 AM

## 2021-10-19 NOTE — Discharge Instructions (Signed)
IMPORTANT INFORMATION: PAY CLOSE ATTENTION   PHYSICIAN DISCHARGE INSTRUCTIONS  Follow with Primary care provider  Patel, Rutwik K, MD  and other consultants as instructed by your Hospitalist Physician  SEEK MEDICAL CARE OR RETURN TO EMERGENCY ROOM IF SYMPTOMS COME BACK, WORSEN OR NEW PROBLEM DEVELOPS   Please note: You were cared for by a hospitalist during your hospital stay. Every effort will be made to forward records to your primary care provider.  You can request that your primary care provider send for your hospital records if they have not received them.  Once you are discharged, your primary care physician will handle any further medical issues. Please note that NO REFILLS for any discharge medications will be authorized once you are discharged, as it is imperative that you return to your primary care physician (or establish a relationship with a primary care physician if you do not have one) for your post hospital discharge needs so that they can reassess your need for medications and monitor your lab values.  Please get a complete blood count and chemistry panel checked by your Primary MD at your next visit, and again as instructed by your Primary MD.  Get Medicines reviewed and adjusted: Please take all your medications with you for your next visit with your Primary MD  Laboratory/radiological data: Please request your Primary MD to go over all hospital tests and procedure/radiological results at the follow up, please ask your primary care provider to get all Hospital records sent to his/her office.  In some cases, they will be blood work, cultures and biopsy results pending at the time of your discharge. Please request that your primary care provider follow up on these results.  If you are diabetic, please bring your blood sugar readings with you to your follow up appointment with primary care.    Please call and make your follow up appointments as soon as possible.    Also Note  the following: If you experience worsening of your admission symptoms, develop shortness of breath, life threatening emergency, suicidal or homicidal thoughts you must seek medical attention immediately by calling 911 or calling your MD immediately  if symptoms less severe.  You must read complete instructions/literature along with all the possible adverse reactions/side effects for all the Medicines you take and that have been prescribed to you. Take any new Medicines after you have completely understood and accpet all the possible adverse reactions/side effects.   Do not drive when taking Pain medications or sleeping medications (Benzodiazepines)  Do not take more than prescribed Pain, Sleep and Anxiety Medications. It is not advisable to combine anxiety,sleep and pain medications without talking with your primary care practitioner  Special Instructions: If you have smoked or chewed Tobacco  in the last 2 yrs please stop smoking, stop any regular Alcohol  and or any Recreational drug use.  Wear Seat belts while driving.  Do not drive if taking any narcotic, mind altering or controlled substances or recreational drugs or alcohol.       

## 2021-10-19 NOTE — TOC Transition Note (Signed)
Transition of Care [facility]) - CM/SW Discharge Note   Patient Details  Name: Benjamin [name] MRN: [id number] Date of Birth: 1940-12-16  [facility]) CM/SW Contact:  Shade Flood, LCSW Phone Number: 10/19/2021, 10:56 AM   Clinical Narrative:     Pt stable for dc today per MD. Pt expressing desire to return home with outpatient rehab. PT has worked with pt this AM and feel pt would be safe with this dc plan. Spoke with pt's wife by phone to update. She is agreeable to having pt return home and she would like to pick him up at the main entrance at noon. Pt and his wife request referral to [facility] outpatient rehab on [address]. Referral sent this AM.  Updated MD and RN on dc plan. Updated Kerri at [facility] that pt would not return to rehab. No other TOC needs identified.  Final next level of care: OP Rehab Barriers to Discharge: Barriers Resolved   Patient Goals and CMS Choice Patient states their goals for this hospitalization and ongoing recovery are:: go home   Choice offered to / list presented to : Patient  Discharge Placement                       Discharge Plan and Services In-house Referral: Clinical Social Work   Post Acute Care Choice: Resumption of Svcs/PTA Provider                               Social Determinants of Health (SDOH) Interventions     Readmission Risk Interventions Readmission Risk Prevention Plan 08/27/2021  Medication Screening Complete  Transportation Screening Complete  Some recent data might be hidden

## 2021-10-19 NOTE — Evaluation (Addendum)
Physical Therapy Evaluation Patient Details Name: Benjamin [name] MRN: [id number] DOB: 01/28/1941 Today's Date: 10/19/2021  History of Present Illness  Benjamin [name]  is a 81 y.o. male, with past medical history of chronic diastolic CHF, COPD, CAD status post CABG, patient with recent hospitalization at [facility] from 10/10/2021 until 10/12/2021 due to acute respiratory failure with questionable pneumonia, patient was discharged to [facility] for subacute rehab.  -Patient was sent from [facility] for evaluation for vomiting, patient had multiple episodes of nausea-vomiting, as well complaining of abdominal pain, from 7 to 11 AM which prompted them to send him to ED, patient reports some dyspnea which is around his baseline, he has new oxygen requirement since recent discharge, he reports some cough, he denies any fever, chest pain, diarrhea, but reports constipation last bowel movement was 10/09/2021, he denies any coughing or choking while eating, any aspiration.  -In ED his work-up significant for lactic acid of 2.8, sodium of 134, creatinine of 1.1, white blood cell count of 12.9, CT chest significant for bilateral groundglass opacities at the bases of the lung with no significant change from recent CT last week, but he is having new right midlung infiltrate, CT abdomen and pelvis significant for diverticulosis, no diverticulitis, patient was started on broad-spectrum antibiotics and [facility] hospitalist consulted to admit.   Clinical Impression  Patient presents seated at EOB and demonstrates good return for completing sit to supine, supine to sitting propping up on elbows without using bed rail with verbal cues.  Patient demonstrates good return for ambulation in room/hallways without loss of balance and good return for going up/down stairs in stairwell using bilateral side rails with step-to pattern without loss of balance.  Patient demonstrates good return for transferring to/from commode in bathroom [place]  side rail and tolerated sitting up at bedside after therapy.  Plan:  patient to be discharged from hospital today and discharged from physical therapy to care of nursing for ambulation daily as tolerated for length of stay.      Recommendations for follow up therapy are one component of a multi-disciplinary discharge planning process, led by the attending physician.  Recommendations may be updated based on patient status, additional functional criteria and insurance authorization.  Follow Up Recommendations Home health PT    Assistance Recommended at Discharge Set up Supervision/Assistance  Patient can return home with the following  A little help with walking and/or transfers;A little help with bathing/dressing/bathroom;Help with stairs or ramp for entrance;Assistance with cooking/housework    Equipment Recommendations None recommended by PT  Recommendations for Other Services       Functional Status Assessment Patient has had a recent decline in their functional status and demonstrates the ability to make significant improvements in function in a reasonable and predictable amount of time.     Precautions / Restrictions Precautions Precautions: Fall Restrictions Weight Bearing Restrictions: No      Mobility  Bed Mobility Overal bed mobility: Modified Independent             General bed mobility comments: slightly labored movement with good return for propping up on elbows during supine to sitting after verbal cues    Transfers Overall transfer level: Needs assistance Equipment used: Rolling walker (2 wheels) Transfers: Sit to/from Stand, Bed to chair/wheelchair/BSC Sit to Stand: Supervision     Squat pivot transfers: Supervision     General transfer comment: good return for sit to stands and transferring back to bedside without loss of balance  Ambulation/Gait Ambulation/Gait assistance: Supervision Gait Distance (Feet): 100 Feet Assistive device: Rolling  walker (2 wheels) Gait Pattern/deviations: Decreased step length - right, Decreased step length - left, Decreased stride length Gait velocity: decreased     General Gait Details: slightly labored cadence without loss of balance requiring sitting rest break before returning back to room  Stairs Stairs: Yes Stairs assistance: Supervision Stair Management: Two rails, Step to pattern Number of Stairs: 5 General stair comments: demonstrates good return for going up/down stairs using bilateral side rails without loss of balance and understanding acknowledged  Wheelchair Mobility    Modified Rankin (Stroke Patients Only)       Balance Overall balance assessment: Needs assistance Sitting-balance support: Feet supported, No upper extremity supported Sitting balance-Leahy Scale: Good Sitting balance - Comments: seated at EOB   Standing balance support: During functional activity, Bilateral upper extremity supported Standing balance-Leahy Scale: Fair Standing balance comment: fair/good using RW                             Pertinent Vitals/Pain Pain Assessment Pain Assessment: No/denies pain    Home Living Family/patient expects to be discharged to:: Private residence Living Arrangements: Spouse/significant other Available Help at Discharge: Family;Available 24 hours/day Type of Home: House Home Access: Stairs to enter Entrance Stairs-Rails: Right;Left;Can reach both Entrance Stairs-Number of Steps: 4-5   Home Layout: One level Home Equipment: Conservation officer, nature (2 wheels);Cane - single point;Shower seat;BSC/3in1;Grab bars - toilet;Grab bars - tub/shower Additional Comments: Pt came from [place] [facility] but wants to return home.    Prior Function Prior Level of Function : Needs assist       Physical Assist : Mobility (physical);ADLs (physical) Mobility (physical): Bed mobility;Transfers;Gait;Stairs   Mobility Comments: Assisted household ambulator at rehab ADLs  Comments: assisted by SNF staff     Hand Dominance   Dominant Hand: Right    Extremity/Trunk Assessment   Upper Extremity Assessment Upper Extremity Assessment: Overall WFL for tasks assessed    Lower Extremity Assessment Lower Extremity Assessment: Overall WFL for tasks assessed    Cervical / Trunk Assessment Cervical / Trunk Assessment: Normal  Communication   Communication: HOH  Cognition Arousal/Alertness: Awake/alert Behavior During Therapy: WFL for tasks assessed/performed Overall Cognitive Status: Within Functional Limits for tasks assessed                                          General Comments      Exercises     Assessment/Plan    PT Assessment Patient needs continued PT services  PT Problem List Decreased strength;Decreased activity tolerance;Decreased balance;Decreased mobility       PT Treatment Interventions DME instruction;Gait training;Stair training;Functional mobility training;Therapeutic activities;Therapeutic exercise;Patient/family education;Balance training    PT Goals (Current goals can be found in the Care Plan section)  Acute Rehab PT Goals Patient Stated Goal: return home with family to assist PT Goal Formulation: With patient Time For Goal Achievement: 10/22/21 Potential to Achieve Goals: Good    Frequency       Co-evaluation               AM-PAC PT "6 Clicks" Mobility  Outcome Measure Help needed turning from your back to your side while in a flat bed without using bedrails?: None Help needed moving from lying on your back to sitting on the side of  a flat bed without using bedrails?: None Help needed moving to and from a bed to a chair (including a wheelchair)?: None Help needed standing up from a chair using your arms (e.g., wheelchair or bedside chair)?: None Help needed to walk in hospital room?: A Little Help needed climbing 3-5 steps with a railing? : A Little 6 Click Score: 22    End of Session    Activity Tolerance: Patient tolerated treatment well;Patient limited by fatigue Patient left: in bed;with call bell/phone within reach (left seated at EOB) Nurse Communication: Mobility status PT Visit Diagnosis: Unsteadiness on feet (R26.81);Other abnormalities of gait and mobility (R26.89);Muscle weakness (generalized) (M62.81)    Time: 1011-1040 PT Time Calculation (min) (ACUTE ONLY): 29 min   Charges:   PT Evaluation $PT Eval Moderate Complexity: 1 Mod PT Treatments $Therapeutic Activity: 23-37 mins        12:16 PM, 10/19/21 Lonell Grandchild, MPT Physical Therapist with [facility] [phone] office [phone] mobile phone

## 2021-10-19 NOTE — Progress Notes (Signed)
Patient ambulated with 2-wheel walker in hallway with standby assistance. He was able to walk around the nurses station and back to his room. Patient maintained oxygen saturation on room air in low 90s. Patient desires to go home with his wife and outpatient PT with the [facility] site on [address]. He has 4 steps with railing on both sides to get into his home. Notified PT of this & Jeneen Rinks will work with him on the steps so that he will be able to go home with wife.

## 2021-10-19 NOTE — Discharge Summary (Signed)
Physician Discharge Summary  Benjamin [name] BMW:[id number] DOB: 1940-11-03 DOA: 10/17/2021  PCP: Lindell Spar, MD  Admit date: 10/17/2021 Discharge date: 10/19/2021  Admitted From:  SNF  Disposition:  Home (refused return to SNF)  Recommendations for Outpatient Follow-up:  Follow up with PCP in 1 weeks Outpatient PT/OT has been ordered  Discharge Condition: Medically Stable   CODE STATUS: Full DIET: resume prior home diet    Brief Hospitalization Summary: Please see all hospital notes, images, labs for full details of the hospitalization. 81 y.o. male, with past medical history of chronic diastolic CHF, COPD, CAD status post CABG, patient with recent hospitalization at [facility] from 10/10/2021 until 10/12/2021 due to acute respiratory failure with questionable pneumonia, patient was discharged to [facility] for subacute rehab. -Patient was sent from [facility] for evaluation for vomiting, patient had multiple episodes of nausea-vomiting, as well complaining of abdominal pain, from 7 to 11 AM which prompted them to send him to ED, patient reports some dyspnea which is around his baseline, he has new oxygen requirement since recent discharge, he reports some cough, he denies any fever, chest pain, diarrhea, but reports constipation last bowel movement was 10/09/2021, he denies any coughing or choking while eating, any aspiration. -In ED his work-up significant for lactic acid of 2.8, sodium of 134, creatinine of 1.1, white blood cell count of 12.9, CT chest significant for bilateral groundglass opacities at the bases of the lung with no significant change from recent CT last week, but he is having new right midlung infiltrate, CT abdomen and pelvis significant for diverticulosis, no diverticulitis, patient was started on broad-spectrum antibiotics and [facility] hospitalist consulted to admit.   Assessment and Plan: * HCAP (healthcare-associated pneumonia)- (present on admission) - Treated with  broad spectrum antibiotics and de-escalated doxycycline 100 mg BID x 5 more days    Severe Sepsis secondary to pneumonia  - Treated with sepsis protocol - clinically much improved - lactic acidosis corrected  Acute respiratory failure with hypoxia ([place])- (present on admission) -Patient was started on oxygen during recent admission and discharged to [facility], otherwise no oxygen requirement at baseline. -Still significant with bilateral basal multifocal opacities, and new right midlung opacity, does appear to be having worsening for pneumonia, I think his bibasilar groundglass opacities most likely due to to his COVID infection last December, right lung pneumonia most likely due to bacterial pneumonia, healthcare associated pneumonia given recent hospitalization and SNF stay. -He was  Solu-Medrol to see if it helps with his oxygen requirement. -Continue IV cefepime, add vancomycin for HCAP, will narrow antibiotics when cultures available. Hopefully on 10/19/21.  -He was encouraged to use incentive spirometer and flutter valve. -SLP evaluated and placed on regular diet, thin liquids. - He is on longer requiring any supplemental oxygen and ambulating well with PT without oxygen.  - He is stable to discharge home.  Outpatient follow up with his regular providers.  - Pt declined to return to SNF and will go home with wife.     COPD GOLD 2- (present on admission) He has significant COPD and is followed closely by a pulmonologist Dr. Melvyn Novas  -Continues to have dyspneic symptoms -Continue bronchodilators as ordered -Continue steroids tapered   Chronic diastolic CHF (congestive heart failure) ([place])- (present on admission) Temporarily holding Lasix likely can resume on 10/22/2021    CAD (coronary artery disease)- (present on admission) - Stable no chest pain symptoms at this time resume home cardiac medications   S/P CABG (  coronary artery bypass graft) - stable on current home cardiac medications     Constipation- (present on admission) - PRN laxatives ordered    BPH (benign prostatic hyperplasia)- (present on admission) -Continue with Flomax, check bladder scan   GERD (gastroesophageal reflux disease)- (present on admission) - Protonix ordered for GI protection.   Discharge Diagnoses:  Principal Problem:   HCAP (healthcare-associated pneumonia) Active Problems:   Acute respiratory failure with hypoxia (HCC)   COPD GOLD 2   Chronic diastolic CHF (congestive heart failure) (HCC)   CAD (coronary artery disease)   S/P CABG (coronary artery bypass graft)   Constipation   BPH (benign prostatic hyperplasia)   GERD (gastroesophageal reflux disease)   Discharge Instructions: Discharge Instructions     Ambulatory referral to Occupational Therapy   Complete by: As directed    Ambulatory referral to Physical Therapy   Complete by: As directed       Allergies as of 10/19/2021       Reactions   Arsenic Swelling   Severe swelling if patient comes in contact    Contrast Media [iodinated Contrast Media] Swelling, Rash   Statins Rash   Joint pain        Medication List     TAKE these medications    acetaminophen 500 MG tablet Commonly known as: TYLENOL Take 1,000 mg by mouth every 8 (eight) hours as needed for mild pain.   colestipol 1 g tablet Commonly known as: COLESTID Take 2 g by mouth daily.   CRANBERRY EXTRACT PO Take 400 mg by mouth 2 (two) times daily.   doxycycline 100 MG capsule Commonly known as: VIBRAMYCIN Take 1 capsule (100 mg total) by mouth 2 (two) times daily for 5 days.   furosemide 20 MG tablet Commonly known as: LASIX Take 1 tablet (20 mg total) by mouth every other day. Start taking on: October 22, 2021 What changed:  when to take this These instructions start on October 22, 2021. If you are unsure what to do until then, ask your doctor or other care provider.   guaiFENesin 600 MG 12 hr tablet Commonly known as: MUCINEX Take 1  tablet (600 mg total) by mouth 2 (two) times daily for 5 days.   Ipratropium-Albuterol 20-100 MCG/ACT Aers respimat Commonly known as: COMBIVENT Inhale 1 puff into the lungs every 4 (four) hours as needed for wheezing or shortness of breath.   ipratropium-albuterol 0.5-2.5 (3) MG/3ML Soln Commonly known as: DUONEB Take 3 mLs by nebulization every 4 (four) hours as needed.   metoprolol tartrate 25 MG tablet Commonly known as: LOPRESSOR Take 0.5 tablets (12.5 mg total) by mouth 2 (two) times daily.   pantoprazole 40 MG tablet Commonly known as: PROTONIX Take 80 mg by mouth 2 (two) times daily.   predniSONE 20 MG tablet Commonly known as: DELTASONE Take 3 PO QAM x3days, 2 PO QAM x3days, 1 PO QAM x3days Start taking on: October 21, 9379   Repatha SureClick 829 MG/ML Soaj Generic drug: Evolocumab INJECT 140MG  SUBCUTANEOUSLY EVERY 14 DAYS AS DIRECTED BY MD   tamsulosin 0.4 MG Caps capsule Commonly known as: FLOMAX TAKE 2 CAPSULES BY MOUTH ONCE DAILY AFTER SUPPER FOR PROSTATE        Follow-up Information     Lindell Spar, MD. Schedule an appointment as soon as possible for a visit in 1 week(s).   Specialty: Internal Medicine Why: Hospital Follow Up Contact information: [address] [place] [address] [phone]  Satira Sark, MD .   Specialty: Cardiology Contact information: [place] [address] [phone]                Allergies  Allergen Reactions   Arsenic Swelling    Severe swelling if patient comes in contact    Contrast Media [Iodinated Contrast Media] Swelling and Rash   Statins Rash    Joint pain   Allergies as of 10/19/2021       Reactions   Arsenic Swelling   Severe swelling if patient comes in contact    Contrast Media [iodinated Contrast Media] Swelling, Rash   Statins Rash   Joint pain        Medication List     TAKE these medications    acetaminophen 500 MG tablet Commonly known  as: TYLENOL Take 1,000 mg by mouth every 8 (eight) hours as needed for mild pain.   colestipol 1 g tablet Commonly known as: COLESTID Take 2 g by mouth daily.   CRANBERRY EXTRACT PO Take 400 mg by mouth 2 (two) times daily.   doxycycline 100 MG capsule Commonly known as: VIBRAMYCIN Take 1 capsule (100 mg total) by mouth 2 (two) times daily for 5 days.   furosemide 20 MG tablet Commonly known as: LASIX Take 1 tablet (20 mg total) by mouth every other day. Start taking on: October 22, 2021 What changed:  when to take this These instructions start on October 22, 2021. If you are unsure what to do until then, ask your doctor or other care provider.   guaiFENesin 600 MG 12 hr tablet Commonly known as: MUCINEX Take 1 tablet (600 mg total) by mouth 2 (two) times daily for 5 days.   Ipratropium-Albuterol 20-100 MCG/ACT Aers respimat Commonly known as: COMBIVENT Inhale 1 puff into the lungs every 4 (four) hours as needed for wheezing or shortness of breath.   ipratropium-albuterol 0.5-2.5 (3) MG/3ML Soln Commonly known as: DUONEB Take 3 mLs by nebulization every 4 (four) hours as needed.   metoprolol tartrate 25 MG tablet Commonly known as: LOPRESSOR Take 0.5 tablets (12.5 mg total) by mouth 2 (two) times daily.   pantoprazole 40 MG tablet Commonly known as: PROTONIX Take 80 mg by mouth 2 (two) times daily.   predniSONE 20 MG tablet Commonly known as: DELTASONE Take 3 PO QAM x3days, 2 PO QAM x3days, 1 PO QAM x3days Start taking on: October 21, 6759   Repatha SureClick 950 MG/ML Soaj Generic drug: Evolocumab INJECT 140MG  SUBCUTANEOUSLY EVERY 14 DAYS AS DIRECTED BY MD   tamsulosin 0.4 MG Caps capsule Commonly known as: FLOMAX TAKE 2 CAPSULES BY MOUTH ONCE DAILY AFTER SUPPER FOR PROSTATE        Procedures/Studies: CT ABDOMEN PELVIS WO CONTRAST  Result Date: 10/17/2021 CLINICAL DATA:  Nausea/vomiting Abdominal pain, acute, nonlocalized; Respiratory illness,  nondiagnostic xray EXAM: CT CHEST, ABDOMEN AND PELVIS WITHOUT CONTRAST TECHNIQUE: Multidetector CT imaging of the chest, abdomen and pelvis was performed following the standard protocol without IV contrast. RADIATION DOSE REDUCTION: This exam was performed according to the departmental dose-optimization program which includes automated exposure control, adjustment of the mA and/or kV according to patient size and/or use of iterative reconstruction technique. COMPARISON:  CT chest 10/10/2021 FINDINGS: CT CHEST FINDINGS Cardiovascular: Normal heart size. No significant pericardial effusion. The thoracic aorta is normal in caliber. Severe atherosclerotic plaque of the thoracic aorta. At least 3 vessel coronary artery calcifications status post coronary artery bypass graft. Mediastinum/Nodes: No gross hilar  adenopathy, noting limited sensitivity for the detection of hilar adenopathy on this noncontrast study. No enlarged mediastinal or axillary lymph nodes. Thyroid gland, trachea, and esophagus demonstrate no significant findings. Tiny hiatial hernia. Lungs/Pleura: Biapical mild paraseptal and mild to moderate centrilobular emphysematous changes. Similar-appearing biapical pleural/pulmonary scarring that is nodular-like (3:17). Interval development of right middle lobe peribronchovascular ground-glass airspace opacities. Slightly worsened bilateral lower lobe patchy opacities with diffuse bronchial wall thickening, right greater than left. No pulmonary nodule. No pulmonary mass. No pleural effusion. No pneumothorax. Musculoskeletal: No chest wall abnormality. No suspicious lytic or blastic osseous lesions. No acute displaced fracture. CT ABDOMEN PELVIS FINDINGS Hepatobiliary: No focal liver abnormality. No gallstones, gallbladder wall thickening, or pericholecystic fluid. No biliary dilatation. Pancreas: No focal lesion. Normal pancreatic contour. No surrounding inflammatory changes. No main pancreatic ductal  dilatation. Spleen: Normal in size without focal abnormality. Adrenals/Urinary Tract: No adrenal nodule bilaterally. Bilateral kidneys enhance symmetrically. No hydronephrosis. No hydroureter. The urinary bladder is unremarkable. Stomach/Bowel: Stomach is within normal limits. No evidence of bowel wall thickening or dilatation. Colonic diverticulosis. Status post appendectomy. Vascular/Lymphatic: No abdominal aorta or iliac aneurysm. Severe atherosclerotic plaque of the aorta and its branches. No abdominal, pelvic, or inguinal lymphadenopathy. Reproductive: The prostate is enlarged measuring up to 6 cm. Other: No intraperitoneal free fluid. No intraperitoneal free gas. No organized fluid collection. Musculoskeletal: Tiny fat containing left inguinal hernia. No suspicious lytic or blastic osseous lesions. No acute displaced fracture. IMPRESSION: 1. Interval development of right middle lobe peribronchovascular vascular ground-glass airspace opacity suggestive of infection/inflammation. Bilateral lower lobe opacities could represent a combination of atelectasis versus infection/inflammation. 2. Colonic diverticulosis with no acute diverticulitis. 3. Tiny fat containing left inguinal hernia. 4. Aortic Atherosclerosis (ICD10-I70.0) and Emphysema (ICD10-J43.9). Electronically Signed   By: Iven Finn M.D.   On: 10/17/2021 16:34   DG Chest 2 View  Result Date: 10/10/2021 CLINICAL DATA:  Generalized weakness all over for 2-3 weeks, getting over COVID-19 2 weeks ago, still positive on home test today EXAM: CHEST - 2 VIEW COMPARISON:  08/25/2021 FINDINGS: Normal heart size post CABG. Mediastinal contours and pulmonary vascularity normal. Atherosclerotic calcification aorta. Persistent bibasilar pulmonary infiltrates. Upper lungs clear. No pleural effusion or pneumothorax. Bones demineralized. IMPRESSION: Persistent bibasilar infiltrates. Aortic Atherosclerosis (ICD10-I70.0). Electronically Signed   By: Lavonia Dana  M.D.   On: 10/10/2021 16:21   CT Chest Wo Contrast  Result Date: 10/17/2021 CLINICAL DATA:  Nausea/vomiting Abdominal pain, acute, nonlocalized; Respiratory illness, nondiagnostic xray EXAM: CT CHEST, ABDOMEN AND PELVIS WITHOUT CONTRAST TECHNIQUE: Multidetector CT imaging of the chest, abdomen and pelvis was performed following the standard protocol without IV contrast. RADIATION DOSE REDUCTION: This exam was performed according to the departmental dose-optimization program which includes automated exposure control, adjustment of the mA and/or kV according to patient size and/or use of iterative reconstruction technique. COMPARISON:  CT chest 10/10/2021 FINDINGS: CT CHEST FINDINGS Cardiovascular: Normal heart size. No significant pericardial effusion. The thoracic aorta is normal in caliber. Severe atherosclerotic plaque of the thoracic aorta. At least 3 vessel coronary artery calcifications status post coronary artery bypass graft. Mediastinum/Nodes: No gross hilar adenopathy, noting limited sensitivity for the detection of hilar adenopathy on this noncontrast study. No enlarged mediastinal or axillary lymph nodes. Thyroid gland, trachea, and esophagus demonstrate no significant findings. Tiny hiatial hernia. Lungs/Pleura: Biapical mild paraseptal and mild to moderate centrilobular emphysematous changes. Similar-appearing biapical pleural/pulmonary scarring that is nodular-like (3:17). Interval development of right middle lobe peribronchovascular ground-glass airspace opacities. Slightly worsened bilateral  lower lobe patchy opacities with diffuse bronchial wall thickening, right greater than left. No pulmonary nodule. No pulmonary mass. No pleural effusion. No pneumothorax. Musculoskeletal: No chest wall abnormality. No suspicious lytic or blastic osseous lesions. No acute displaced fracture. CT ABDOMEN PELVIS FINDINGS Hepatobiliary: No focal liver abnormality. No gallstones, gallbladder wall thickening, or  pericholecystic fluid. No biliary dilatation. Pancreas: No focal lesion. Normal pancreatic contour. No surrounding inflammatory changes. No main pancreatic ductal dilatation. Spleen: Normal in size without focal abnormality. Adrenals/Urinary Tract: No adrenal nodule bilaterally. Bilateral kidneys enhance symmetrically. No hydronephrosis. No hydroureter. The urinary bladder is unremarkable. Stomach/Bowel: Stomach is within normal limits. No evidence of bowel wall thickening or dilatation. Colonic diverticulosis. Status post appendectomy. Vascular/Lymphatic: No abdominal aorta or iliac aneurysm. Severe atherosclerotic plaque of the aorta and its branches. No abdominal, pelvic, or inguinal lymphadenopathy. Reproductive: The prostate is enlarged measuring up to 6 cm. Other: No intraperitoneal free fluid. No intraperitoneal free gas. No organized fluid collection. Musculoskeletal: Tiny fat containing left inguinal hernia. No suspicious lytic or blastic osseous lesions. No acute displaced fracture. IMPRESSION: 1. Interval development of right middle lobe peribronchovascular vascular ground-glass airspace opacity suggestive of infection/inflammation. Bilateral lower lobe opacities could represent a combination of atelectasis versus infection/inflammation. 2. Colonic diverticulosis with no acute diverticulitis. 3. Tiny fat containing left inguinal hernia. 4. Aortic Atherosclerosis (ICD10-I70.0) and Emphysema (ICD10-J43.9). Electronically Signed   By: Iven Finn M.D.   On: 10/17/2021 16:34   CT CHEST WO CONTRAST  Result Date: 10/10/2021 CLINICAL DATA:  Acute hypoxic respiratory failure, BNP elevated, chest x-ray suggesting persistent bilateral basilar infiltrates. EXAM: CT CHEST WITHOUT CONTRAST TECHNIQUE: Multidetector CT imaging of the chest was performed following the standard protocol without IV contrast. RADIATION DOSE REDUCTION: This exam was performed according to the departmental dose-optimization program  which includes automated exposure control, adjustment of the mA and/or kV according to patient size and/or use of iterative reconstruction technique. COMPARISON:  Radiograph earlier today.  Chest CT 01/21/2019 FINDINGS: Cardiovascular: The heart is normal in size. Post CABG. Atherosclerosis of the thoracic aorta. Calcification of native coronary arteries. No pericardial effusion. Mediastinum/Nodes: Small mediastinal lymph nodes are not enlarged by size criteria, similar to prior exam and typically reactive. No esophageal wall thickening. Small hiatal hernia. No visualized thyroid nodule. Lungs/Pleura: Moderate emphysema. Streaky and ill-defined dependent opacities within both lower lobes, similar distribution to prior exam, but increased in degree. Mild associated bibasilar bronchiectasis. There is biapical pleuroparenchymal scarring. No acute airspace consolidation. No pulmonary mass or suspicious nodule. No significant pleural effusion. There is no septal thickening or findings of pulmonary edema. The trachea and central bronchi are patent. Upper Abdomen: No acute upper abdominal findings. Low-density left adrenal nodule is stable, consistent with adenoma. Musculoskeletal: Thoracic spondylosis with endplate spurring. There are no acute or suspicious osseous abnormalities. IMPRESSION: 1. Streaky and ill-defined dependent opacities within both lower lobes. This is in a similar distribution to 2021 exam, but increased in degree. Findings favor progressive chronic atelectasis, however superimposed infection or aspiration are also considered. 2. Moderately advanced emphysema. 3. No pulmonary edema or findings of congestive failure. Aortic Atherosclerosis (ICD10-I70.0) and Emphysema (ICD10-J43.9). Electronically Signed   By: Keith Rake M.D.   On: 10/10/2021 22:59   DG CHEST PORT 1 VIEW  Result Date: 10/19/2021 CLINICAL DATA:  Sepsis EXAM: PORTABLE CHEST 1 VIEW COMPARISON:  10/17/2021 FINDINGS: Improved lung  volumes. Some patchy opacities are present at the lung bases. Background emphysema. No significant pleural effusion. Similar cardiomediastinal contours. IMPRESSION: Mild  patchy atelectasis/consolidation at the lung bases. Electronically Signed   By: Macy Mis M.D.   On: 10/19/2021 09:45   DG Chest Portable 1 View  Result Date: 10/17/2021 CLINICAL DATA:  Emesis EXAM: PORTABLE CHEST 1 VIEW COMPARISON:  Chest x-ray dated October 08, 2021 FINDINGS: Cardiac and mediastinal contours are unchanged. Unchanged mild bibasilar opacities. Possible small left pleural effusion. Of pneumothorax. IMPRESSION: 1. Unchanged bibasilar opacities. 2. Possible small left pleural effusion. Electronically Signed   By: Yetta Glassman M.D.   On: 10/17/2021 13:01   DG Chest Port 1 View  Result Date: 10/14/2021 CLINICAL DATA:  Chest pain EXAM: PORTABLE CHEST 1 VIEW COMPARISON:  10/10/2021 FINDINGS: Heart size within normal limits. Aortic atherosclerosis. Streaky bibasilar opacities, left greater than right, slightly increased from prior. No pleural effusion or pneumothorax. IMPRESSION: Streaky bibasilar opacities, slightly increased from prior. Findings may represent atelectasis or infection. Electronically Signed   By: Davina Poke D.O.   On: 10/14/2021 12:24   ECHOCARDIOGRAM COMPLETE  Result Date: 10/12/2021    ECHOCARDIOGRAM REPORT   Patient Name:   TRINTEN BOUDOIN Date of Exam: 10/12/2021 Medical Rec #:  [id number]  Height:       71.0 in Accession #:    [id number] Weight:       189.6 lb Date of Birth:  [birth year]   BSA:          2.061 m Patient Age:    81 years   BP:           142/72 mmHg Patient Gender: M          HR:           54 bpm. Exam Location:  [facility] Procedure: 2D Echo, Cardiac Doppler and Color Doppler Indications:    R06.02 SOB  History:        Patient has prior history of Echocardiogram examinations, most                 recent 01/21/2020. CHF, CAD, Prior CABG; Signs/Symptoms:Shortness                 of Breath.   Sonographer:    Roseanna Rainbow RDCS Referring Phys: [id number] RACHAL A DAVID  Sonographer Comments: Technically difficult study due to poor echo windows. IMPRESSIONS  1. Left ventricular ejection fraction, by estimation, is 60 to 65%. The left ventricle has normal function. The left ventricle has no regional wall motion abnormalities. Left ventricular diastolic parameters are consistent with Grade I diastolic dysfunction (impaired relaxation).  2. Right ventricular systolic function is normal. The right ventricular size is normal. Tricuspid regurgitation signal is inadequate for assessing PA pressure.  3. The mitral valve is grossly normal. Trivial mitral valve regurgitation.  4. The aortic valve is tricuspid. Aortic valve regurgitation is not visualized. Aortic valve sclerosis/calcification is present, without any evidence of aortic stenosis.  5. Aortic dilatation noted. There is borderline dilatation of the aortic root, measuring 39 mm.  6. Prominent epicardial fat layer. Comparison(s): Prior images reviewed side by side. LVEF remains normal at 60-65% with mild diastolic dysfunction. FINDINGS  Left Ventricle: Left ventricular ejection fraction, by estimation, is 60 to 65%. The left ventricle has normal function. The left ventricle has no regional wall motion abnormalities. The left ventricular internal cavity size was normal in size. There is  no left ventricular hypertrophy. Left ventricular diastolic parameters are consistent with Grade I diastolic dysfunction (impaired relaxation). Right Ventricle: The right ventricular size is normal. No increase in right  ventricular wall thickness. Right ventricular systolic function is normal. Tricuspid regurgitation signal is inadequate for assessing PA pressure. Left Atrium: Left atrial size was normal in size. Right Atrium: Right atrial size was normal in size. Pericardium: There is no evidence of pericardial effusion. Presence of epicardial fat layer. Mitral Valve: The mitral  valve is grossly normal. Mild mitral annular calcification. Trivial mitral valve regurgitation. Tricuspid Valve: The tricuspid valve is grossly normal. Tricuspid valve regurgitation is trivial. Aortic Valve: The aortic valve is tricuspid. There is mild aortic valve annular calcification. Aortic valve regurgitation is not visualized. Aortic valve sclerosis/calcification is present, without any evidence of aortic stenosis. Pulmonic Valve: The pulmonic valve was grossly normal. Pulmonic valve regurgitation is trivial. Aorta: Aortic dilatation noted. There is borderline dilatation of the aortic root, measuring 39 mm. IAS/Shunts: The interatrial septum was not well visualized.  LEFT VENTRICLE PLAX 2D LVIDd:         3.80 cm     Diastology LVIDs:         2.20 cm     LV e' medial:    5.00 cm/s LV PW:         1.00 cm     LV E/e' medial:  11.5 LV IVS:        1.00 cm     LV e' lateral:   6.96 cm/s LVOT diam:     2.00 cm     LV E/e' lateral: 8.3 LV SV:         45 LV SV Index:   22 LVOT Area:     3.14 cm  LV Volumes (MOD) LV vol d, MOD A2C: 30.1 ml LV vol d, MOD A4C: 30.2 ml LV vol s, MOD A2C: 9.8 ml LV vol s, MOD A4C: 7.2 ml LV SV MOD A2C:     20.3 ml LV SV MOD A4C:     30.2 ml LV SV MOD BP:      21.5 ml RIGHT VENTRICLE RV S prime:     4.68 cm/s TAPSE (M-mode): 0.7 cm LEFT ATRIUM             Index        RIGHT ATRIUM          Index LA diam:        3.30 cm 1.60 cm/m   RA Area:     6.65 cm LA Vol (A2C):   22.5 ml 10.92 ml/m  RA Volume:   11.60 ml 5.63 ml/m LA Vol (A4C):   23.0 ml 11.16 ml/m LA Biplane Vol: 24.3 ml 11.79 ml/m  AORTIC VALVE LVOT Vmax:   87.50 cm/s LVOT Vmean:  54.500 cm/s LVOT VTI:    0.143 m  AORTA Ao Root diam: 3.90 cm Ao Asc diam:  3.40 cm MITRAL VALVE MV Area (PHT): 2.69 cm    SHUNTS MV Decel Time: 282 msec    Systemic VTI:  0.14 m MV E velocity: 57.75 cm/s  Systemic Diam: 2.00 cm MV A velocity: 80.75 cm/s MV E/A ratio:  0.72 Rozann Lesches MD Electronically signed by Rozann Lesches MD Signature  Date/Time: 10/12/2021/2:58:49 PM    Final    [place] EKG SITE RITE  Result Date: 10/18/2021 If Site Rite image not attached, placement could not be confirmed due to current cardiac rhythm.    Subjective: Pt removed his oxygen and says that it did not help.  Cough and SOB is better, no chest pain.   Discharge Exam: Vitals:   10/19/21 0450 10/19/21  0803  BP: (!) 113/51 (!) 111/53  Pulse: (!) 58 60  Resp: 18 18  Temp: 98.8 F (37.1 C) 98.7 F (37.1 C)  SpO2: 92% 92%   Vitals:   10/18/21 2300 10/19/21 0100 10/19/21 0450 10/19/21 0803  BP: 124/65 116/72 (!) 113/51 (!) 111/53  Pulse: 70 65 (!) 58 60  Resp: 16 16 18 18   Temp: 98 F (36.7 C) 97.6 F (36.4 C) 98.8 F (37.1 C) 98.7 F (37.1 C)  TempSrc: Oral Oral  Oral  SpO2: 90% 90% 92% 92%  Weight:      Height:       General: Pt is alert, awake, not in acute distress Cardiovascular: RRR, S1/S2 +, no rubs, no gallops Respiratory: rare expiratory wheezing, no rhonchi, no increased work of breathing, on room air. Abdominal: Soft, NT, ND, bowel sounds + Extremities: no edema, no cyanosis   The results of significant diagnostics from this hospitalization (including imaging, microbiology, ancillary and laboratory) are listed below for reference.     Microbiology: Recent Results (from the past 240 hour(s))  Resp Panel by RT-PCR (Flu A&B, Covid) Nasopharyngeal Swab     Status: None   Collection Time: 10/10/21  7:33 PM   Specimen: Nasopharyngeal Swab; Nasopharyngeal(NP) swabs in vial transport medium  Result Value Ref Range Status   SARS Coronavirus 2 by RT PCR NEGATIVE NEGATIVE Final    Comment: (NOTE) SARS-CoV-2 target nucleic acids are NOT DETECTED.  The SARS-CoV-2 RNA is generally detectable in upper respiratory specimens during the acute phase of infection. The lowest concentration of SARS-CoV-2 viral copies this assay can detect is 138 copies/mL. A negative result does not preclude SARS-Cov-2 infection and should not be used  as the sole basis for treatment or other patient management decisions. A negative result may occur with  improper specimen collection/handling, submission of specimen other than nasopharyngeal swab, presence of viral mutation(s) within the areas targeted by this assay, and inadequate number of viral copies(<138 copies/mL). A negative result must be combined with clinical observations, patient history, and epidemiological information. The expected result is Negative.  Fact Sheet for Patients:  EntrepreneurPulse.com.au  Fact Sheet for Healthcare Providers:  IncredibleEmployment.be  This test is no t yet approved or cleared by the [place] FDA and  has been authorized for detection and/or diagnosis of SARS-CoV-2 by FDA under an Emergency Use Authorization (EUA). This EUA will remain  in effect (meaning this test can be used) for the duration of the COVID-19 declaration under Section 564(b)(1) of the Act, 21 U.S.C.section 360bbb-3(b)(1), unless the authorization is terminated  or revoked sooner.       Influenza A by PCR NEGATIVE NEGATIVE Final   Influenza B by PCR NEGATIVE NEGATIVE Final    Comment: (NOTE) The Xpert Xpress SARS-CoV-2/FLU/RSV plus assay is intended as an aid in the diagnosis of influenza from Nasopharyngeal swab specimens and should not be used as a sole basis for treatment. Nasal washings and aspirates are unacceptable for Xpert Xpress SARS-CoV-2/FLU/RSV testing.  Fact Sheet for Patients: EntrepreneurPulse.com.au  Fact Sheet for Healthcare Providers: IncredibleEmployment.be  This test is not yet approved or cleared by the [place] FDA and has been authorized for detection and/or diagnosis of SARS-CoV-2 by FDA under an Emergency Use Authorization (EUA). This EUA will remain in effect (meaning this test can be used) for the duration of the COVID-19 declaration under Section 564(b)(1)  of the Act, 21 U.S.C. section 360bbb-3(b)(1), unless the authorization is terminated or revoked.  Performed at [facility], [address], [place], [place] [address]   Resp Panel by RT-PCR (Flu A&B, Covid) Nasopharyngeal Swab     Status: None   Collection Time: 10/14/21  1:25 PM   Specimen: Nasopharyngeal Swab; Nasopharyngeal(NP) swabs in vial transport medium  Result Value Ref Range Status   SARS Coronavirus 2 by RT PCR NEGATIVE NEGATIVE Final    Comment: (NOTE) SARS-CoV-2 target nucleic acids are NOT DETECTED.  The SARS-CoV-2 RNA is generally detectable in upper respiratory specimens during the acute phase of infection. The lowest concentration of SARS-CoV-2 viral copies this assay can detect is 138 copies/mL. A negative result does not preclude SARS-Cov-2 infection and should not be used as the sole basis for treatment or other patient management decisions. A negative result may occur with  improper specimen collection/handling, submission of specimen other than nasopharyngeal swab, presence of viral mutation(s) within the areas targeted by this assay, and inadequate number of viral copies(<138 copies/mL). A negative result must be combined with clinical observations, patient history, and epidemiological information. The expected result is Negative.  Fact Sheet for Patients:  EntrepreneurPulse.com.au  Fact Sheet for Healthcare Providers:  IncredibleEmployment.be  This test is no t yet approved or cleared by the [place] FDA and  has been authorized for detection and/or diagnosis of SARS-CoV-2 by FDA under an Emergency Use Authorization (EUA). This EUA will remain  in effect (meaning this test can be used) for the duration of the COVID-19 declaration under Section 564(b)(1) of the Act, 21 U.S.C.section 360bbb-3(b)(1), unless the authorization is terminated  or revoked sooner.       Influenza A by PCR NEGATIVE NEGATIVE Final    Influenza B by PCR NEGATIVE NEGATIVE Final    Comment: (NOTE) The Xpert Xpress SARS-CoV-2/FLU/RSV plus assay is intended as an aid in the diagnosis of influenza from Nasopharyngeal swab specimens and should not be used as a sole basis for treatment. Nasal washings and aspirates are unacceptable for Xpert Xpress SARS-CoV-2/FLU/RSV testing.  Fact Sheet for Patients: EntrepreneurPulse.com.au  Fact Sheet for Healthcare Providers: IncredibleEmployment.be  This test is not yet approved or cleared by the [place] FDA and has been authorized for detection and/or diagnosis of SARS-CoV-2 by FDA under an Emergency Use Authorization (EUA). This EUA will remain in effect (meaning this test can be used) for the duration of the COVID-19 declaration under Section 564(b)(1) of the Act, 21 U.S.C. section 360bbb-3(b)(1), unless the authorization is terminated or revoked.  Performed at [facility], [address], [place], [place] [address]   Resp Panel by RT-PCR (Flu A&B, Covid) Nasopharyngeal Swab     Status: None   Collection Time: 10/17/21  3:40 PM   Specimen: Nasopharyngeal Swab; Nasopharyngeal(NP) swabs in vial transport medium  Result Value Ref Range Status   SARS Coronavirus 2 by RT PCR NEGATIVE NEGATIVE Final    Comment: (NOTE) SARS-CoV-2 target nucleic acids are NOT DETECTED.  The SARS-CoV-2 RNA is generally detectable in upper respiratory specimens during the acute phase of infection. The lowest concentration of SARS-CoV-2 viral copies this assay can detect is 138 copies/mL. A negative result does not preclude SARS-Cov-2 infection and should not be used as the sole basis for treatment or other patient management decisions. A negative result may occur with  improper specimen collection/handling, submission of specimen other than nasopharyngeal swab, presence of viral mutation(s) within the areas targeted by this assay, and inadequate number of  viral copies(<138 copies/mL). A negative result must be combined with clinical observations, patient history, and  epidemiological information. The expected result is Negative.  Fact Sheet for Patients:  EntrepreneurPulse.com.au  Fact Sheet for Healthcare Providers:  IncredibleEmployment.be  This test is no t yet approved or cleared by the [place] FDA and  has been authorized for detection and/or diagnosis of SARS-CoV-2 by FDA under an Emergency Use Authorization (EUA). This EUA will remain  in effect (meaning this test can be used) for the duration of the COVID-19 declaration under Section 564(b)(1) of the Act, 21 U.S.C.section 360bbb-3(b)(1), unless the authorization is terminated  or revoked sooner.       Influenza A by PCR NEGATIVE NEGATIVE Final   Influenza B by PCR NEGATIVE NEGATIVE Final    Comment: (NOTE) The Xpert Xpress SARS-CoV-2/FLU/RSV plus assay is intended as an aid in the diagnosis of influenza from Nasopharyngeal swab specimens and should not be used as a sole basis for treatment. Nasal washings and aspirates are unacceptable for Xpert Xpress SARS-CoV-2/FLU/RSV testing.  Fact Sheet for Patients: EntrepreneurPulse.com.au  Fact Sheet for Healthcare Providers: IncredibleEmployment.be  This test is not yet approved or cleared by the [place] FDA and has been authorized for detection and/or diagnosis of SARS-CoV-2 by FDA under an Emergency Use Authorization (EUA). This EUA will remain in effect (meaning this test can be used) for the duration of the COVID-19 declaration under Section 564(b)(1) of the Act, 21 U.S.C. section 360bbb-3(b)(1), unless the authorization is terminated or revoked.  Performed at [facility], [address], [place], [place] [address]   Culture, blood (routine x 2)     Status: None (Preliminary result)   Collection Time: 10/17/21  7:08 PM   Specimen: BLOOD  RIGHT HAND  Result Value Ref Range Status   Specimen Description BLOOD RIGHT HAND  Final   Special Requests   Final    BOTTLES DRAWN AEROBIC AND ANAEROBIC Blood Culture adequate volume   Culture   Final    NO GROWTH 2 DAYS Performed at [facility], [address] [place], [place] [address]    Report Status PENDING  Incomplete  Culture, blood (routine x 2)     Status: None (Preliminary result)   Collection Time: 10/17/21  7:11 PM   Specimen: BLOOD LEFT HAND  Result Value Ref Range Status   Specimen Description BLOOD LEFT HAND  Final   Special Requests   Final    BOTTLES DRAWN AEROBIC AND ANAEROBIC Blood Culture adequate volume   Culture   Final    NO GROWTH 2 DAYS Performed at [facility], [address] [place], [place] [address]    Report Status PENDING  Incomplete     Labs: BNP (last 3 results) Recent Labs    08/25/21 0931 10/10/21 1634 10/14/21 1203  BNP 83.0 298.0* 213.0*   Basic Metabolic Panel: Recent Labs  Lab 10/14/21 1203 10/17/21 1301 10/17/21 1437 10/17/21 1908 10/18/21 0602 10/19/21 0532  NA 138 140  --  134* 135 138  K 3.4* 4.3  --  4.2 4.3 4.4  CL 107 107  --  103 106 107  CO2 23 22  --  23 22 24   GLUCOSE 84 159*  --  137* 156* 144*  BUN 28* QUANTITY NOT SUFFICIENT, UNABLE TO PERFORM TEST 25* 25* 25* 21  CREATININE 1.30* QUANTITY NOT SUFFICIENT, UNABLE TO PERFORM TEST 1.12 1.16 1.00 0.96  CALCIUM 8.8* 9.0  --  8.4* 8.3* 8.4*  MG  --  2.0  --   --   --  2.0   Liver Function  Tests: Recent Labs  Lab 10/14/21 1203 10/17/21 1301 10/19/21 0532  AST 24 29 23   ALT 31 39 32  ALKPHOS 47 55 35*  BILITOT 1.7* 2.6* 1.2  PROT 6.8 7.0 5.8*  ALBUMIN 3.8 4.1 3.2*   Recent Labs  Lab 10/17/21 1301  LIPASE 35   No results for input(s): AMMONIA in the last 168 hours. CBC: Recent Labs  Lab 10/14/21 1203 10/17/21 1301 10/18/21 0602 10/19/21 0532  WBC 7.9 12.9* 11.9* 9.0  NEUTROABS 4.7 12.0*  --  7.9*  HGB 16.0 17.8* 14.7 13.5  HCT 50.0 54.2*  44.7 39.9  MCV 94.2 96.3 93.1 94.1  PLT 286 235 228 217   Cardiac Enzymes: No results for input(s): CKTOTAL, CKMB, CKMBINDEX, TROPONINI in the last 168 hours. BNP: Invalid input(s): POCBNP CBG: Recent Labs  Lab 10/17/21 1215  GLUCAP 149*   D-Dimer Recent Labs    10/17/21 1301  DDIMER 0.92*   Hgb A1c No results for input(s): HGBA1C in the last 72 hours. Lipid Profile No results for input(s): CHOL, HDL, LDLCALC, TRIG, CHOLHDL, LDLDIRECT in the last 72 hours. Thyroid function studies No results for input(s): TSH, T4TOTAL, T3FREE, THYROIDAB in the last 72 hours.  Invalid input(s): FREET3 Anemia work up No results for input(s): VITAMINB12, FOLATE, FERRITIN, TIBC, IRON, RETICCTPCT in the last 72 hours. Urinalysis    Component Value Date/Time   COLORURINE YELLOW 10/17/2021 1240   APPEARANCEUR CLEAR 10/17/2021 1240   LABSPEC >1.030 (H) 10/17/2021 1240   PHURINE 5.5 10/17/2021 1240   GLUCOSEU NEGATIVE 10/17/2021 1240   HGBUR TRACE (A) 10/17/2021 1240   BILIRUBINUR NEGATIVE 10/17/2021 1240   KETONESUR NEGATIVE 10/17/2021 1240   PROTEINUR NEGATIVE 10/17/2021 1240   NITRITE NEGATIVE 10/17/2021 1240   LEUKOCYTESUR NEGATIVE 10/17/2021 1240   Sepsis Labs Invalid input(s): PROCALCITONIN,  WBC,  LACTICIDVEN Microbiology Recent Results (from the past 240 hour(s))  Resp Panel by RT-PCR (Flu A&B, Covid) Nasopharyngeal Swab     Status: None   Collection Time: 10/10/21  7:33 PM   Specimen: Nasopharyngeal Swab; Nasopharyngeal(NP) swabs in vial transport medium  Result Value Ref Range Status   SARS Coronavirus 2 by RT PCR NEGATIVE NEGATIVE Final    Comment: (NOTE) SARS-CoV-2 target nucleic acids are NOT DETECTED.  The SARS-CoV-2 RNA is generally detectable in upper respiratory specimens during the acute phase of infection. The lowest concentration of SARS-CoV-2 viral copies this assay can detect is 138 copies/mL. A negative result does not preclude SARS-Cov-2 infection and should  not be used as the sole basis for treatment or other patient management decisions. A negative result may occur with  improper specimen collection/handling, submission of specimen other than nasopharyngeal swab, presence of viral mutation(s) within the areas targeted by this assay, and inadequate number of viral copies(<138 copies/mL). A negative result must be combined with clinical observations, patient history, and epidemiological information. The expected result is Negative.  Fact Sheet for Patients:  EntrepreneurPulse.com.au  Fact Sheet for Healthcare Providers:  IncredibleEmployment.be  This test is no t yet approved or cleared by the [place] FDA and  has been authorized for detection and/or diagnosis of SARS-CoV-2 by FDA under an Emergency Use Authorization (EUA). This EUA will remain  in effect (meaning this test can be used) for the duration of the COVID-19 declaration under Section 564(b)(1) of the Act, 21 U.S.C.section 360bbb-3(b)(1), unless the authorization is terminated  or revoked sooner.       Influenza A by PCR NEGATIVE NEGATIVE Final   Influenza  B by PCR NEGATIVE NEGATIVE Final    Comment: (NOTE) The Xpert Xpress SARS-CoV-2/FLU/RSV plus assay is intended as an aid in the diagnosis of influenza from Nasopharyngeal swab specimens and should not be used as a sole basis for treatment. Nasal washings and aspirates are unacceptable for Xpert Xpress SARS-CoV-2/FLU/RSV testing.  Fact Sheet for Patients: EntrepreneurPulse.com.au  Fact Sheet for Healthcare Providers: IncredibleEmployment.be  This test is not yet approved or cleared by the [place] FDA and has been authorized for detection and/or diagnosis of SARS-CoV-2 by FDA under an Emergency Use Authorization (EUA). This EUA will remain in effect (meaning this test can be used) for the duration of the COVID-19 declaration under  Section 564(b)(1) of the Act, 21 U.S.C. section 360bbb-3(b)(1), unless the authorization is terminated or revoked.  Performed at [facility], [address], [place], [place] [address]   Resp Panel by RT-PCR (Flu A&B, Covid) Nasopharyngeal Swab     Status: None   Collection Time: 10/14/21  1:25 PM   Specimen: Nasopharyngeal Swab; Nasopharyngeal(NP) swabs in vial transport medium  Result Value Ref Range Status   SARS Coronavirus 2 by RT PCR NEGATIVE NEGATIVE Final    Comment: (NOTE) SARS-CoV-2 target nucleic acids are NOT DETECTED.  The SARS-CoV-2 RNA is generally detectable in upper respiratory specimens during the acute phase of infection. The lowest concentration of SARS-CoV-2 viral copies this assay can detect is 138 copies/mL. A negative result does not preclude SARS-Cov-2 infection and should not be used as the sole basis for treatment or other patient management decisions. A negative result may occur with  improper specimen collection/handling, submission of specimen other than nasopharyngeal swab, presence of viral mutation(s) within the areas targeted by this assay, and inadequate number of viral copies(<138 copies/mL). A negative result must be combined with clinical observations, patient history, and epidemiological information. The expected result is Negative.  Fact Sheet for Patients:  EntrepreneurPulse.com.au  Fact Sheet for Healthcare Providers:  IncredibleEmployment.be  This test is no t yet approved or cleared by the [place] FDA and  has been authorized for detection and/or diagnosis of SARS-CoV-2 by FDA under an Emergency Use Authorization (EUA). This EUA will remain  in effect (meaning this test can be used) for the duration of the COVID-19 declaration under Section 564(b)(1) of the Act, 21 U.S.C.section 360bbb-3(b)(1), unless the authorization is terminated  or revoked sooner.       Influenza A by PCR NEGATIVE  NEGATIVE Final   Influenza B by PCR NEGATIVE NEGATIVE Final    Comment: (NOTE) The Xpert Xpress SARS-CoV-2/FLU/RSV plus assay is intended as an aid in the diagnosis of influenza from Nasopharyngeal swab specimens and should not be used as a sole basis for treatment. Nasal washings and aspirates are unacceptable for Xpert Xpress SARS-CoV-2/FLU/RSV testing.  Fact Sheet for Patients: EntrepreneurPulse.com.au  Fact Sheet for Healthcare Providers: IncredibleEmployment.be  This test is not yet approved or cleared by the [place] FDA and has been authorized for detection and/or diagnosis of SARS-CoV-2 by FDA under an Emergency Use Authorization (EUA). This EUA will remain in effect (meaning this test can be used) for the duration of the COVID-19 declaration under Section 564(b)(1) of the Act, 21 U.S.C. section 360bbb-3(b)(1), unless the authorization is terminated or revoked.  Performed at [facility], [address], [place], [place] [address]   Resp Panel by RT-PCR (Flu A&B, Covid) Nasopharyngeal Swab     Status: None   Collection Time: 10/17/21  3:40 PM   Specimen: Nasopharyngeal  Swab; Nasopharyngeal(NP) swabs in vial transport medium  Result Value Ref Range Status   SARS Coronavirus 2 by RT PCR NEGATIVE NEGATIVE Final    Comment: (NOTE) SARS-CoV-2 target nucleic acids are NOT DETECTED.  The SARS-CoV-2 RNA is generally detectable in upper respiratory specimens during the acute phase of infection. The lowest concentration of SARS-CoV-2 viral copies this assay can detect is 138 copies/mL. A negative result does not preclude SARS-Cov-2 infection and should not be used as the sole basis for treatment or other patient management decisions. A negative result may occur with  improper specimen collection/handling, submission of specimen other than nasopharyngeal swab, presence of viral mutation(s) within the areas targeted by this assay, and  inadequate number of viral copies(<138 copies/mL). A negative result must be combined with clinical observations, patient history, and epidemiological information. The expected result is Negative.  Fact Sheet for Patients:  EntrepreneurPulse.com.au  Fact Sheet for Healthcare Providers:  IncredibleEmployment.be  This test is no t yet approved or cleared by the [place] FDA and  has been authorized for detection and/or diagnosis of SARS-CoV-2 by FDA under an Emergency Use Authorization (EUA). This EUA will remain  in effect (meaning this test can be used) for the duration of the COVID-19 declaration under Section 564(b)(1) of the Act, 21 U.S.C.section 360bbb-3(b)(1), unless the authorization is terminated  or revoked sooner.       Influenza A by PCR NEGATIVE NEGATIVE Final   Influenza B by PCR NEGATIVE NEGATIVE Final    Comment: (NOTE) The Xpert Xpress SARS-CoV-2/FLU/RSV plus assay is intended as an aid in the diagnosis of influenza from Nasopharyngeal swab specimens and should not be used as a sole basis for treatment. Nasal washings and aspirates are unacceptable for Xpert Xpress SARS-CoV-2/FLU/RSV testing.  Fact Sheet for Patients: EntrepreneurPulse.com.au  Fact Sheet for Healthcare Providers: IncredibleEmployment.be  This test is not yet approved or cleared by the [place] FDA and has been authorized for detection and/or diagnosis of SARS-CoV-2 by FDA under an Emergency Use Authorization (EUA). This EUA will remain in effect (meaning this test can be used) for the duration of the COVID-19 declaration under Section 564(b)(1) of the Act, 21 U.S.C. section 360bbb-3(b)(1), unless the authorization is terminated or revoked.  Performed at [facility], [address] [place], [place] [address]   Culture, blood (routine x 2)     Status: None (Preliminary result)   Collection Time: 10/17/21  7:08  PM   Specimen: BLOOD RIGHT HAND  Result Value Ref Range Status   Specimen Description BLOOD RIGHT HAND  Final   Special Requests   Final    BOTTLES DRAWN AEROBIC AND ANAEROBIC Blood Culture adequate volume   Culture   Final    NO GROWTH 2 DAYS Performed at [facility], [address], [place], [place] [address]    Report Status PENDING  Incomplete  Culture, blood (routine x 2)     Status: None (Preliminary result)   Collection Time: 10/17/21  7:11 PM   Specimen: BLOOD LEFT HAND  Result Value Ref Range Status   Specimen Description BLOOD LEFT HAND  Final   Special Requests   Final    BOTTLES DRAWN AEROBIC AND ANAEROBIC Blood Culture adequate volume   Culture   Final    NO GROWTH 2 DAYS Performed at [facility], [address] [place], [place] [address]    Report Status PENDING  Incomplete   Time coordinating discharge: 38 mins   SIGNED:  Irwin Brakeman, MD  [facility] 10/19/2021, 11:07 AM How to contact the [facility] Attending or Consulting provider [place] or covering provider during after hours [place], for this patient?  Check the care team in [facility] and look for a) attending/consulting [facility] provider listed and b) the [facility] team listed Log into [email or web address] and use [place]'s universal password to access. If you do not have the password, please contact the hospital operator. Locate the [facility] provider you are looking for under [facility] and page to a number that you can be directly reached. If you still have difficulty reaching the provider, please page the [facility] (Director on Call) for the Hospitalists listed on amion for assistance.

## 2021-10-19 NOTE — Progress Notes (Signed)
Nsg Discharge Note  Admit Date:  10/17/2021 Discharge date: 10/19/2021   Benjamin [name] to be D/C'd Home per MD order.  AVS completed.   Patient/caregiver able to verbalize understanding.  Discharge Medication: Allergies as of 10/19/2021       Reactions   Arsenic Swelling   Severe swelling if patient comes in contact    Contrast Media [iodinated Contrast Media] Swelling, Rash   Statins Rash   Joint pain        Medication List     TAKE these medications    acetaminophen 500 MG tablet Commonly known as: TYLENOL Take 1,000 mg by mouth every 8 (eight) hours as needed for mild pain.   colestipol 1 g tablet Commonly known as: COLESTID Take 2 g by mouth daily.   CRANBERRY EXTRACT PO Take 400 mg by mouth 2 (two) times daily.   doxycycline 100 MG capsule Commonly known as: VIBRAMYCIN Take 1 capsule (100 mg total) by mouth 2 (two) times daily for 5 days.   furosemide 20 MG tablet Commonly known as: LASIX Take 1 tablet (20 mg total) by mouth every other day. Start taking on: October 22, 2021 What changed:  when to take this These instructions start on October 22, 2021. If you are unsure what to do until then, ask your doctor or other care provider.   guaiFENesin 600 MG 12 hr tablet Commonly known as: MUCINEX Take 1 tablet (600 mg total) by mouth 2 (two) times daily for 5 days.   Ipratropium-Albuterol 20-100 MCG/ACT Aers respimat Commonly known as: COMBIVENT Inhale 1 puff into the lungs every 4 (four) hours as needed for wheezing or shortness of breath.   ipratropium-albuterol 0.5-2.5 (3) MG/3ML Soln Commonly known as: DUONEB Take 3 mLs by nebulization every 4 (four) hours as needed.   metoprolol tartrate 25 MG tablet Commonly known as: LOPRESSOR Take 0.5 tablets (12.5 mg total) by mouth 2 (two) times daily.   pantoprazole 40 MG tablet Commonly known as: PROTONIX Take 80 mg by mouth 2 (two) times daily.   predniSONE 20 MG tablet Commonly known as: DELTASONE Take 3 PO  QAM x3days, 2 PO QAM x3days, 1 PO QAM x3days Start taking on: October 20, 7508   Repatha SureClick 258 MG/ML Soaj Generic drug: Evolocumab INJECT 140MG  SUBCUTANEOUSLY EVERY 14 DAYS AS DIRECTED BY MD   tamsulosin 0.4 MG Caps capsule Commonly known as: FLOMAX TAKE 2 CAPSULES BY MOUTH ONCE DAILY AFTER SUPPER FOR PROSTATE        Discharge Assessment: Vitals:   10/19/21 0450 10/19/21 0803  BP: (!) 113/51 (!) 111/53  Pulse: (!) 58 60  Resp: 18 18  Temp: 98.8 F (37.1 C) 98.7 F (37.1 C)  SpO2: 92% 92%   Skin clean, dry and intact without evidence of skin break down, no evidence of skin tears noted. IV catheter discontinued intact. Site without signs and symptoms of complications - no redness or edema noted at insertion site, patient denies c/o pain - only slight tenderness at site.  Dressing with slight pressure applied.  D/c Instructions-Education: Discharge instructions given to patient/family with verbalized understanding. D/c education completed with patient/family including follow up instructions, medication list, d/c activities limitations if indicated, with other d/c instructions as indicated by MD - patient able to verbalize understanding, all questions fully answered. Patient instructed to return to ED, call 911, or call MD for any changes in condition.  Patient escorted via [place], and D/C home via private auto.  Kathie Rhodes, RN 10/19/2021  11:13 AM

## 2021-10-19 NOTE — Progress Notes (Signed)
Patient's midline removed. Vaseline gauze dressing applied.

## 2021-10-22 ENCOUNTER — Ambulatory Visit (HOSPITAL_COMMUNITY): Payer: Medicare Other | Attending: Family Medicine | Admitting: Physical Therapy

## 2021-10-22 ENCOUNTER — Other Ambulatory Visit: Payer: Self-pay

## 2021-10-22 ENCOUNTER — Telehealth: Payer: Self-pay

## 2021-10-22 ENCOUNTER — Encounter (HOSPITAL_COMMUNITY): Payer: Self-pay | Admitting: Physical Therapy

## 2021-10-22 DIAGNOSIS — R2689 Other abnormalities of gait and mobility: Secondary | ICD-10-CM | POA: Diagnosis not present

## 2021-10-22 DIAGNOSIS — M6281 Muscle weakness (generalized): Secondary | ICD-10-CM

## 2021-10-22 DIAGNOSIS — R29898 Other symptoms and signs involving the musculoskeletal system: Secondary | ICD-10-CM

## 2021-10-22 DIAGNOSIS — J189 Pneumonia, unspecified organism: Secondary | ICD-10-CM | POA: Insufficient documentation

## 2021-10-22 DIAGNOSIS — K649 Unspecified hemorrhoids: Secondary | ICD-10-CM

## 2021-10-22 NOTE — Telephone Encounter (Signed)
Kept original appt 10/30/21 at 2:40 pm.

## 2021-10-22 NOTE — Patient Instructions (Signed)
Access Code: [email or web address] URL: [email or web address] Date: 10/22/2021 Prepared by: Margie Billet  Exercises Seated March - 3 x daily - 7 x weekly - 1 sets - 10 reps - 5 second hold Seated Long Arc Quad - 3 x daily - 7 x weekly - 10 reps - 5 second hold
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
## 2021-10-22 NOTE — Telephone Encounter (Signed)
D/C from hospital 10/19/21. TOC call done and he was concerned about his hemorrhoids bleeding and not a small amount but everytime he has a BM the blood will flow for a while. Scheduled for TOC 10/30/21. Does he need to come in before then or what does he need to do about the bleeding? Please advise

## 2021-10-22 NOTE — Telephone Encounter (Signed)
Transition Care Management Follow-up Telephone Call Date of discharge and from where: 10-29-21 [facility] How have you been since you were released from the hospital? ok Any questions or concerns? Hemmhoids bleeding pretty badYes  Items Reviewed: Did the pt receive and understand the discharge instructions provided? Yes  Medications obtained and verified? Yes  Other? No  Any new allergies since your discharge? No  Dietary orders reviewed? Yes Do you have support at home? Yes   Home Care and Equipment/Supplies: Were home health services ordered? no If so, what is the name of the agency? Referred to outpt PT/OT  Has the agency set up a time to come to the patient's home? not applicable Were any new equipment or medical supplies ordered?  No What is the name of the medical supply agency? NA Were you able to get the supplies/equipment? not applicable Do you have any questions related to the use of the equipment or supplies? No  Functional Questionnaire: (I = Independent and D = Dependent) ADLs: I  Bathing/Dressing- I  Meal Prep- I  Eating- I  Maintaining continence- I  Transferring/Ambulation- I  Managing Meds- I  Follow up appointments reviewed:  PCP Hospital f/u appt confirmed? Yes  Scheduled to see PATEL on 10/30/21 @ 2:40PM. [facility] f/u appt confirmed? No  Are transportation arrangements needed? No  If their condition worsens, is the pt aware to call PCP or go to the Emergency Dept.? Yes Was the patient provided with contact information for the PCP's office or ED? Yes Was to pt encouraged to call back with questions or concerns? Yes

## 2021-10-22 NOTE — Telephone Encounter (Signed)
Posey Pronto wants to see pt tomorrow- see his response. May double book

## 2021-10-23 ENCOUNTER — Inpatient Hospital Stay: Payer: Medicare Other | Admitting: Internal Medicine

## 2021-10-23 ENCOUNTER — Telehealth: Payer: Self-pay

## 2021-10-23 ENCOUNTER — Other Ambulatory Visit: Payer: Self-pay | Admitting: *Deleted

## 2021-10-23 ENCOUNTER — Encounter: Payer: Self-pay | Admitting: General Surgery

## 2021-10-23 ENCOUNTER — Ambulatory Visit (INDEPENDENT_AMBULATORY_CARE_PROVIDER_SITE_OTHER): Payer: Medicare Other | Admitting: General Surgery

## 2021-10-23 VITALS — BP 108/71 | HR 59 | Temp 98.7°F | Resp 14 | Ht 72.0 in | Wt 184.0 lb

## 2021-10-23 DIAGNOSIS — Z09 Encounter for follow-up examination after completed treatment for conditions other than malignant neoplasm: Secondary | ICD-10-CM

## 2021-10-23 DIAGNOSIS — K625 Hemorrhage of anus and rectum: Secondary | ICD-10-CM

## 2021-10-23 DIAGNOSIS — D3132 Benign neoplasm of left choroid: Secondary | ICD-10-CM | POA: Diagnosis not present

## 2021-10-23 DIAGNOSIS — K641 Second degree hemorrhoids: Secondary | ICD-10-CM

## 2021-10-23 LAB — CULTURE, BLOOD (ROUTINE X 2)
Culture: NO GROWTH
Culture: NO GROWTH
Special Requests: ADEQUATE
Special Requests: ADEQUATE

## 2021-10-23 LAB — HM DIABETES EYE EXAM

## 2021-10-23 NOTE — Patient Instructions (Addendum)
Keep your stool regular and soft. Take benefiber or metamucil to keep your stools regular. Try the over the counter samples to see if they help with the swelling. Expect some bleeding, but this should get better.  Get your blood levels draw to see how much blood you have lost.  Someone will call you with the results.      Hemorrhoids Hemorrhoids are swollen veins in and around the rectum or anus. There are two types of hemorrhoids: Internal hemorrhoids. These occur in the veins that are just inside the rectum. They may poke through to the outside and become irritated and painful. External hemorrhoids. These occur in the veins that are outside the anus and can be felt as a painful swelling or hard lump near the anus. Most hemorrhoids do not cause serious problems, and they can be managed with home treatments such as diet and lifestyle changes. If home treatments do not help the symptoms, procedures can be done to shrink or remove the hemorrhoids. What are the causes? This condition is caused by increased pressure in the anal area. This pressure may result from various things, including: Constipation. Straining to have a bowel movement. Diarrhea. Pregnancy. Obesity. Sitting for long periods of time. Heavy lifting or other activity that causes you to strain. Anal sex. Riding a bike for a long period of time. What are the signs or symptoms? Symptoms of this condition include: Pain. Anal itching or irritation. Rectal bleeding. Leakage of stool (feces). Anal swelling. One or more lumps around the anus. How is this diagnosed? This condition can often be diagnosed through a visual exam. Other exams or tests may also be done, such as: An exam that involves feeling the rectal area with a gloved hand (digital rectal exam). An exam of the anal canal that is done using a small tube (anoscope). A blood test, if you have lost a significant amount of blood. A test to look inside the colon  using a flexible tube with a camera on the end (sigmoidoscopy or colonoscopy). How is this treated? This condition can usually be treated at home. However, various procedures may be done if dietary changes, lifestyle changes, and other home treatments do not help your symptoms. These procedures can help make the hemorrhoids smaller or remove them completely. Some of these procedures involve surgery, and others do not. Common procedures include: Rubber band ligation. Rubber bands are placed at the base of the hemorrhoids to cut off their blood supply. Sclerotherapy. Medicine is injected into the hemorrhoids to shrink them. Infrared coagulation. A type of light energy is used to get rid of the hemorrhoids. Hemorrhoidectomy surgery. The hemorrhoids are surgically removed, and the veins that supply them are tied off. Stapled hemorrhoidopexy surgery. The surgeon staples the base of the hemorrhoid to the rectal wall. Follow these instructions at home: Eating and drinking  Eat foods that have a lot of fiber in them, such as whole grains, beans, nuts, fruits, and vegetables. Ask your health care provider about taking products that have added fiber (fiber supplements). Reduce the amount of fat in your diet. You can do this by eating low-fat dairy products, eating less red meat, and avoiding processed foods. Drink enough fluid to keep your urine pale yellow. Managing pain and swelling  Take warm sitz baths for 20 minutes, 3-4 times a day to ease pain and discomfort. You may do this in a bathtub or using a portable sitz bath that fits over the toilet. If directed, apply ice  to the affected area. Using ice packs between sitz baths may be helpful. Put ice in a plastic bag. Place a towel between your skin and the bag. Leave the ice on for 20 minutes, 2-3 times a day. General instructions Take over-the-counter and prescription medicines only as told by your health care provider. Use medicated creams or  suppositories as told. Get regular exercise. Ask your health care provider how much and what kind of exercise is best for you. In general, you should do moderate exercise for at least 30 minutes on most days of the week (150 minutes each week). This can include activities such as walking, biking, or yoga. Go to the bathroom when you have the urge to have a bowel movement. Do not wait. Avoid straining to have bowel movements. Keep the anal area dry and clean. Use wet toilet paper or moist towelettes after a bowel movement. Do not sit on the toilet for long periods of time. This increases blood pooling and pain. Keep all follow-up visits as told by your health care provider. This is important. Contact a health care provider if you have: Increasing pain and swelling that are not controlled by treatment or medicine. Difficulty having a bowel movement, or you are unable to have a bowel movement. Pain or inflammation outside the area of the hemorrhoids. Get help right away if you have: Uncontrolled bleeding from your rectum. Summary Hemorrhoids are swollen veins in and around the rectum or anus. Most hemorrhoids can be managed with home treatments such as diet and lifestyle changes. Taking warm sitz baths can help ease pain and discomfort. In severe cases, procedures or surgery can be done to shrink or remove the hemorrhoids. This information is not intended to replace advice given to you by your health care provider. Make sure you discuss any questions you have with your health care provider. Document Revised: 03/14/2021 Document Reviewed: 03/14/2021 Elsevier Patient Education  [employer].
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
## 2021-10-23 NOTE — Telephone Encounter (Signed)
Ok to add on to pm 2/14

## 2021-10-23 NOTE — Telephone Encounter (Signed)
Patient scheduled to see Dr. Posey Pronto on 10/30/2020 at 2:40. No openings on [facility] schedule that day.  Dr. Melvyn Novas please advise if its okay to double book patient on that afternoon if patient is willing to see both the same day.

## 2021-10-23 NOTE — Telephone Encounter (Signed)
-----   Message from Tanda Rockers, MD sent at 10/16/2021  4:12 PM EST ----- Sounds good  -  will see if can set up f/u here same day he's here for pcp  thx  ----- Message ----- From: Hendricks Limes, MD Sent: 10/16/2021   4:11 PM EST To: Tanda Rockers, MD, Lindell Spar, MD  Ronalee Belts, the Hospitalists did not focus on his major complaint which was severe generalized weakness.  I am concerned about the persistent lower lobe changes which have actually progressed since 2021. Up To Date does describe rare cases of Guillain-Barr with COVID; but I would have expected him to be, ventilator dependent if he had GB.  He is to see his new PCP Dr. Posey Pronto following [facility] from the [facility].  I like your opinion as to whether he needs expedited Pulmonary follow-up at this time. Thx, Hopp

## 2021-10-24 LAB — BASIC METABOLIC PANEL
BUN/Creatinine Ratio: 21 (ref 10–24)
BUN: 23 mg/dL (ref 8–27)
CO2: 20 mmol/L (ref 20–29)
Calcium: 9.4 mg/dL (ref 8.6–10.2)
Chloride: 104 mmol/L (ref 96–106)
Creatinine, Ser: 1.12 mg/dL (ref 0.76–1.27)
Glucose: 114 mg/dL — ABNORMAL HIGH (ref 70–99)
Potassium: 4.3 mmol/L (ref 3.5–5.2)
Sodium: 142 mmol/L (ref 134–144)
eGFR: 67 mL/min/{1.73_m2} (ref >59)

## 2021-10-24 LAB — CBC
Hematocrit: 47.5 % (ref 37.5–51.0)
Hemoglobin: 16.3 g/dL (ref 13.0–17.7)
MCH: 30.9 pg (ref 26.6–33.0)
MCHC: 34.3 g/dL (ref 31.5–35.7)
MCV: 90 fL (ref 79–97)
Platelets: 258 10*3/uL (ref 150–450)
RBC: 5.27 x10E6/uL (ref 4.14–5.80)
RDW: 13.7 % (ref 11.6–15.4)
WBC: 8.9 10*3/uL (ref 3.4–10.8)

## 2021-10-24 NOTE — Telephone Encounter (Signed)
ATC patient.  LMTCB. 

## 2021-10-25 NOTE — Telephone Encounter (Signed)
Called and spoke to patient and patients wife. Patient was agreeable to come to appt on 2/14 at 2:00 pm in [facility] office. Nothing further needed.

## 2021-10-26 ENCOUNTER — Telehealth (HOSPITAL_COMMUNITY): Payer: Self-pay | Admitting: Occupational Therapy

## 2021-10-26 ENCOUNTER — Ambulatory Visit (HOSPITAL_COMMUNITY): Payer: Medicare Other

## 2021-10-26 ENCOUNTER — Encounter (HOSPITAL_COMMUNITY): Payer: Self-pay

## 2021-10-26 ENCOUNTER — Other Ambulatory Visit: Payer: Self-pay

## 2021-10-26 DIAGNOSIS — R2689 Other abnormalities of gait and mobility: Secondary | ICD-10-CM | POA: Diagnosis not present

## 2021-10-26 DIAGNOSIS — R29898 Other symptoms and signs involving the musculoskeletal system: Secondary | ICD-10-CM

## 2021-10-26 DIAGNOSIS — M6281 Muscle weakness (generalized): Secondary | ICD-10-CM | POA: Diagnosis not present

## 2021-10-26 DIAGNOSIS — J189 Pneumonia, unspecified organism: Secondary | ICD-10-CM | POA: Diagnosis not present

## 2021-10-26 NOTE — Telephone Encounter (Signed)
Patient requested to cx eval and close referral - He doesn't want to take OT.
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
## 2021-10-30 ENCOUNTER — Other Ambulatory Visit: Payer: Self-pay

## 2021-10-30 ENCOUNTER — Encounter: Payer: Self-pay | Admitting: Internal Medicine

## 2021-10-30 ENCOUNTER — Encounter (HOSPITAL_COMMUNITY): Payer: Self-pay

## 2021-10-30 ENCOUNTER — Ambulatory Visit (HOSPITAL_COMMUNITY): Payer: Medicare Other

## 2021-10-30 ENCOUNTER — Ambulatory Visit (INDEPENDENT_AMBULATORY_CARE_PROVIDER_SITE_OTHER): Payer: Medicare Other | Admitting: Internal Medicine

## 2021-10-30 ENCOUNTER — Inpatient Hospital Stay: Payer: Medicare Other | Admitting: Internal Medicine

## 2021-10-30 VITALS — BP 102/58 | HR 87 | Resp 18 | Ht 71.0 in | Wt 193.0 lb

## 2021-10-30 DIAGNOSIS — R2689 Other abnormalities of gait and mobility: Secondary | ICD-10-CM | POA: Diagnosis not present

## 2021-10-30 DIAGNOSIS — M6281 Muscle weakness (generalized): Secondary | ICD-10-CM

## 2021-10-30 DIAGNOSIS — J189 Pneumonia, unspecified organism: Secondary | ICD-10-CM

## 2021-10-30 DIAGNOSIS — I2510 Atherosclerotic heart disease of native coronary artery without angina pectoris: Secondary | ICD-10-CM | POA: Diagnosis not present

## 2021-10-30 DIAGNOSIS — F5104 Psychophysiologic insomnia: Secondary | ICD-10-CM

## 2021-10-30 DIAGNOSIS — Z09 Encounter for follow-up examination after completed treatment for conditions other than malignant neoplasm: Secondary | ICD-10-CM

## 2021-10-30 DIAGNOSIS — K641 Second degree hemorrhoids: Secondary | ICD-10-CM | POA: Diagnosis not present

## 2021-10-30 DIAGNOSIS — N401 Enlarged prostate with lower urinary tract symptoms: Secondary | ICD-10-CM | POA: Diagnosis not present

## 2021-10-30 DIAGNOSIS — Z86010 Personal history of colonic polyps: Secondary | ICD-10-CM | POA: Insufficient documentation

## 2021-10-30 DIAGNOSIS — R29898 Other symptoms and signs involving the musculoskeletal system: Secondary | ICD-10-CM

## 2021-10-30 DIAGNOSIS — R350 Frequency of micturition: Secondary | ICD-10-CM

## 2021-10-30 DIAGNOSIS — H9313 Tinnitus, bilateral: Secondary | ICD-10-CM | POA: Diagnosis not present

## 2021-10-30 DIAGNOSIS — Z8719 Personal history of other diseases of the digestive system: Secondary | ICD-10-CM | POA: Insufficient documentation

## 2021-10-30 DIAGNOSIS — J449 Chronic obstructive pulmonary disease, unspecified: Secondary | ICD-10-CM | POA: Diagnosis not present

## 2021-10-30 MED ORDER — FINASTERIDE 5 MG PO TABS: 5.0000 mg | ORAL_TABLET | Freq: Every day | ORAL | 5 refills | Status: DC

## 2021-10-30 NOTE — Assessment & Plan Note (Signed)
Chronic Referred to ENT specialist

## 2021-10-30 NOTE — Patient Instructions (Signed)
Please start taking Finasteride for BPH in addition to Tamsulosin.  Please continue to take other medications as prescribed.

## 2021-10-30 NOTE — Assessment & Plan Note (Signed)
Now resolved with IV antibiotic followed by oral doxycycline Was given prednisone for acute hypoxic respiratory failure Uses albuterol inhaler and DuoNeb as needed, followed by pulmonology

## 2021-10-30 NOTE — Assessment & Plan Note (Signed)
Uncontrolled with Flomax 0.8 mg QD Added finasteride 5 mg daily

## 2021-10-30 NOTE — Assessment & Plan Note (Signed)
Hospital chart reviewed, including discharge summary Was admitted for acute hypoxic respiratory failure secondary to HCAP Now improved Getting PT for generalized muscle weakness

## 2021-10-30 NOTE — Progress Notes (Signed)
Benjamin [name], male    DOB: 07/21/1941     MRN: [id number]   Brief patient profile:  6 yowm quit smoking 2005 no problem then doe 2020 > cardiac w/u > cabg  Then short rehab needed and back to baseline until Dec 2021 when noted more doe when resumed previous activities he had been avoiding due to covid and found they were more more difficult due to doe eg two aisles at [employer] then sob.     History of Present Illness  12/18/2020  Pulmonary/ 1st office eval/ [facility] / [place] [facility]  Chief Complaint  Patient presents with   Pulmonary Consult    Referred by Dr. Vic Blackbird.  Pt c/o DOE x 4 months. He gets SOB walking approx 500 yards. He is using his albuterol inhaler 3 x per wk on average.   Dyspnea:   100 yards or 3 aisles at [employer]  Cough: every night since forever  Sleep: on side bed is flat  SABA use: albuterol 30 min benefit at most but never rechallenges or prechallenges  02 none Rec Continue the trelegy but use it on even days to see what difference if any it makes in with your activity tolerance.  Work on your inhaler technique       Admit date: 10/17/2021 Discharge date: 10/19/2021   Admitted From:  SNF  Disposition:  Home (refused return to SNF)   Recommendations for Outpatient Follow-up:  Follow up with PCP in 1 weeks Outpatient PT/OT has been ordered   Discharge Condition: Medically Stable   CODE STATUS: Full DIET: resume prior home diet     Brief Hospitalization Summary: Please see all hospital notes, images, labs for full details of the hospitalization. 81 y.o. male, with past medical history of chronic diastolic CHF, COPD, CAD status post CABG, patient with recent hospitalization at [facility] from 10/10/2021 until 10/12/2021 due to acute respiratory failure with questionable pneumonia, patient was discharged to [facility] for subacute rehab. -Patient was sent from [facility] for evaluation for vomiting, patient had multiple episodes of  nausea-vomiting, as well complaining of abdominal pain, from 7 to 11 AM which prompted them to send him to ED, patient reports some dyspnea which is around his baseline, he has new oxygen requirement since recent discharge, he reports some cough, he denies any fever, chest pain, diarrhea, but reports constipation last bowel movement was 10/09/2021, he denies any coughing or choking while eating, any aspiration. -In ED his work-up significant for lactic acid of 2.8, sodium of 134, creatinine of 1.1, white blood cell count of 12.9, CT chest significant for bilateral groundglass opacities at the bases of the lung with no significant change from recent CT last week, but he is having new right midlung infiltrate, CT abdomen and pelvis significant for diverticulosis, no diverticulitis, patient was started on broad-spectrum antibiotics and [facility] hospitalist consulted to admit.   Assessment and Plan: * HCAP (healthcare-associated pneumonia)- (present on admission) - Treated with broad spectrum antibiotics and de-escalated doxycycline 100 mg BID x 5 more days    Severe Sepsis secondary to pneumonia  - Treated with sepsis protocol - clinically much improved - lactic acidosis corrected   Acute respiratory failure with hypoxia ([place])- (present on admission) -Patient was started on oxygen during recent admission and discharged to [facility], otherwise no oxygen requirement at baseline. -Still significant with bilateral basal multifocal opacities, and new right midlung opacity, does appear to be having worsening for pneumonia, I think  his bibasilar groundglass opacities most likely due to to his COVID infection last December, right lung pneumonia most likely due to bacterial pneumonia, healthcare associated pneumonia given recent hospitalization and SNF stay. -He was  Solu-Medrol to see if it helps with his oxygen requirement. -Continue IV cefepime, add vancomycin for HCAP, will narrow antibiotics when cultures  available. Hopefully on 10/19/21.  -He was encouraged to use incentive spirometer and flutter valve. -SLP evaluated and placed on regular diet, thin liquids. - He is on longer requiring any supplemental oxygen and ambulating well with PT without oxygen.  - He is stable to discharge home.  Outpatient follow up with his regular providers.  - Pt declined to return to SNF and will go home with wife.     COPD GOLD 2- (present on admission) He has significant COPD and is followed closely by a pulmonologist Dr. Melvyn Novas  -Continues to have dyspneic symptoms -Continue bronchodilators as ordered -Continue steroids tapered    Chronic diastolic CHF (congestive heart failure) ([place])- (present on admission) Temporarily holding Lasix likely can resume on 10/22/2021    CAD (coronary artery disease)- (present on admission) - Stable no chest pain symptoms at this time resume home cardiac medications   S/P CABG (coronary artery bypass graft) - stable on current home cardiac medications    Constipation- (present on admission) - PRN laxatives ordered    BPH (benign prostatic hyperplasia)- (present on admission) -Continue with Flomax, check bladder scan   GERD (gastroesophageal reflux disease)- (present on admission) - Protonix ordered for GI protection.    10/30/2021  post hosp f/u ov/[place] office/Damari Hiltz re: GOLD 2 copd  maint on no resp rx   Chief Complaint  Patient presents with   Hospitalization Follow-up    F/u hosp for HCAP   Dyspnea:  able to do [employer] shopping again, his baseline  Cough: none  Sleeping: on side/ bed is flat/ one pillow no resp cc SABA use: none  02: none    No obvious day to day or daytime variability or assoc excess/ purulent sputum or mucus plugs or hemoptysis or cp or chest tightness, subjective wheeze or overt sinus or hb symptoms.   Sleeping  without nocturnal  or early am exacerbation  of respiratory  c/o's or need for noct saba. Also denies any obvious fluctuation of  symptoms with weather or environmental changes or other aggravating or alleviating factors except as outlined above   No unusual exposure hx or h/o childhood pna/ asthma or knowledge of premature birth.  Current Allergies, Complete Past Medical History, Past Surgical History, Family History, and Social History were reviewed in [employer] record.  ROS  The following are not active complaints unless bolded Hoarseness, sore throat, dysphagia, dental problems, itching, sneezing,  nasal congestion or discharge of excess mucus or purulent secretions, ear ache,   fever, chills, sweats, unintended wt loss or wt gain, classically pleuritic or exertional cp,  orthopnea pnd or arm/hand swelling  or leg swelling, presyncope, palpitations, abdominal pain, anorexia, nausea, vomiting, diarrhea  or change in bowel habits or change in bladder habits, change in stools or change in urine, dysuria, hematuria,  rash, arthralgias, visual complaints, headache, numbness, weakness or ataxia or problems with walking or coordination,  change in mood or  memory. Tinnitis        Current Meds  Medication Sig   acetaminophen (TYLENOL) 500 MG tablet Take 1,000 mg by mouth every 8 (eight) hours as needed for mild pain.   colestipol (  COLESTID) 1 g tablet Take 2 g by mouth daily.   CRANBERRY EXTRACT PO Take 400 mg by mouth 2 (two) times daily.   furosemide (LASIX) 20 MG tablet Take 1 tablet (20 mg total) by mouth every other day.   Ipratropium-Albuterol (COMBIVENT) 20-100 MCG/ACT AERS respimat Inhale 1 puff into the lungs every 4 (four) hours as needed for wheezing or shortness of breath.   ipratropium-albuterol (DUONEB) 0.5-2.5 (3) MG/3ML SOLN Take 3 mLs by nebulization every 4 (four) hours as needed. (Patient taking differently: Take 3 mLs by nebulization every 4 (four) hours as needed.)   metoprolol tartrate (LOPRESSOR) 25 MG tablet Take 0.5 tablets (12.5 mg total) by mouth 2 (two) times daily.    pantoprazole (PROTONIX) 40 MG tablet Take 80 mg by mouth 2 (two) times daily.   predniSONE (DELTASONE) 20 MG tablet Take 3 PO QAM x3days, 2 PO QAM x3days, 1 PO QAM [id number]   REPATHA SURECLICK 026 MG/ML SOAJ INJECT 140MG  SUBCUTANEOUSLY EVERY 14 DAYS AS DIRECTED BY MD   tamsulosin (FLOMAX) 0.4 MG CAPS capsule TAKE 2 CAPSULES BY MOUTH ONCE DAILY AFTER SUPPER FOR PROSTATE                 Past Medical History:  Diagnosis Date   Arthritis    both shoulders, neck, upper back, and both thumbs   COPD (chronic obstructive pulmonary disease) (HCC)    Coronary artery disease    a. s/p CABG in 02/2019 with LIMA-LAD, SVG-D1, SVG-OM and SVG-RCA   Duodenal ulcer 02/19/2016   Enlarged prostate    GERD (gastroesophageal reflux disease)    Headache(784.0)    Leukopenia 01/21/2020   Skin cancer    Sternal pain    Removal of sternal wires 06/2019   Vision problems    Blind x 20 years, regained site 2000, ? optic nerve injury       Objective:    Wt Readings from Last 3 Encounters:  10/30/21 184 lb 9.6 oz (83.7 kg)  10/23/21 184 lb (83.5 kg)  10/17/21 180 lb 12.4 oz (82 kg)      Vital signs reviewed  10/30/2021  - Note at rest 02 sats  94% on RA   General appearance:    well preserved amb wm bad   HEENT : pt wearing mask not removed for exam due to covid - 19 concerns.   NECK :  without JVD/Nodes/TM/ nl carotid upstrokes bilaterally   LUNGS: no acc muscle use,  Min barrel  contour chest wall with bilateral  slightly decreased bs s audible wheeze and  without cough on insp or exp maneuvers and min  Hyperresonant  to  percussion bilaterally     CV:  RRR  no s3 or murmur or increase in P2, and no edema   ABD:  soft and nontender with pos end  insp Hoover's  in the supine position. No bruits or organomegaly appreciated, bowel sounds nl  MS:   Nl gait/  ext warm without deformities, calf tenderness, cyanosis or clubbing No obvious joint restrictions   SKIN: warm and dry without lesions     NEURO:  alert, approp, nl sensorium with  no motor or cerebellar deficits apparent.          I personally reviewed images / radiology impression as follows:  CXR:   10/19/21 Mild patchy atelectasis/consolidation at the lung bases. My impression: no as dz    Assessment
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
## 2021-10-30 NOTE — Assessment & Plan Note (Signed)
Likely due to sleeping habits Advised to avoid daytime naps Trial of melatonin 5 mg qHS
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
## 2021-10-30 NOTE — Assessment & Plan Note (Signed)
Had profuse rectal bleeding, saw Dr. Constance Haw -conservative management for now Advised to take Colace as needed for constipation Needs to improve fluid intake

## 2021-10-30 NOTE — Patient Instructions (Addendum)
Make sure you check your oxygen saturation at your highest level of activity to be sure it stays over 90% and keep track of it at least once a week, more often if breathing getting worse, and let me know if losing ground.   Pulmonary follow up is as needed

## 2021-10-31 ENCOUNTER — Encounter: Payer: Self-pay | Admitting: Internal Medicine

## 2021-10-31 NOTE — Assessment & Plan Note (Signed)
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  -   12/18/2020   Walked RA  approx   300 ft  @ slow slt unsteady pace  stopped due to  Sob lowest sats 92%  -   PFT's  02/08/21  FEV1 2.22 (75 % ) ratio 0.62  p 0 % improvement from saba p 0 prior to study with DLCO  8.17 (33%) corrects to 1.76 (44%)  for alv volume and FV curve ok   - 10/30/2021   Walked on RA  x  3  lap(s) =  approx 450  ft  @ slow pace, stopped due to end of study / sob on 3rd lap with lowest 02 sats 90%    At this point =  MMRC2 = can't walk a nl pace on a flat grade s sob but does fine slow and flat s any rx at all and has issues with bladder outlet obst so not a good candidate for lama and not even needed sama so rx with duoneb/combivent prn  Also rec  Make sure you check your oxygen saturation at your highest level of activity to be sure it stays over 90% and keep track of it at least once a week, more often if breathing getting worse, and let me know if losing ground.   Pulmonary f/u is prn   Each maintenance medication was reviewed in detail including emphasizing most importantly the difference between maintenance and prns and under what circumstances the prns are to be triggered using an action plan format where appropriate.  Total time for H and P, chart review, counseling, reviewing smi/neb/02  device(s) , directly observing portions of ambulatory 02 saturation study/ and generating customized AVS unique to this post hosp  office visit / same day charting = 33 min

## 2021-11-01 ENCOUNTER — Encounter: Payer: Self-pay | Admitting: *Deleted

## 2021-11-01 ENCOUNTER — Ambulatory Visit (HOSPITAL_COMMUNITY): Payer: Medicare Other | Admitting: Physical Therapy

## 2021-11-01 ENCOUNTER — Other Ambulatory Visit: Payer: Self-pay

## 2021-11-01 DIAGNOSIS — M6281 Muscle weakness (generalized): Secondary | ICD-10-CM | POA: Diagnosis not present

## 2021-11-01 DIAGNOSIS — R29898 Other symptoms and signs involving the musculoskeletal system: Secondary | ICD-10-CM | POA: Diagnosis not present

## 2021-11-01 DIAGNOSIS — R2689 Other abnormalities of gait and mobility: Secondary | ICD-10-CM

## 2021-11-01 DIAGNOSIS — J189 Pneumonia, unspecified organism: Secondary | ICD-10-CM | POA: Diagnosis not present
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
## 2021-11-07 ENCOUNTER — Ambulatory Visit (HOSPITAL_COMMUNITY): Payer: Medicare Other | Admitting: Physical Therapy

## 2021-11-07 ENCOUNTER — Encounter (HOSPITAL_COMMUNITY): Payer: Self-pay | Admitting: Physical Therapy

## 2021-11-07 ENCOUNTER — Other Ambulatory Visit: Payer: Self-pay

## 2021-11-07 DIAGNOSIS — J189 Pneumonia, unspecified organism: Secondary | ICD-10-CM | POA: Diagnosis not present

## 2021-11-07 DIAGNOSIS — M6281 Muscle weakness (generalized): Secondary | ICD-10-CM

## 2021-11-07 DIAGNOSIS — R29898 Other symptoms and signs involving the musculoskeletal system: Secondary | ICD-10-CM | POA: Diagnosis not present

## 2021-11-07 DIAGNOSIS — R2689 Other abnormalities of gait and mobility: Secondary | ICD-10-CM | POA: Diagnosis not present

## 2021-11-07 NOTE — Patient Instructions (Signed)
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
## 2021-11-09 ENCOUNTER — Ambulatory Visit (HOSPITAL_COMMUNITY): Payer: Medicare Other | Admitting: Physical Therapy

## 2021-11-09 ENCOUNTER — Other Ambulatory Visit: Payer: Self-pay

## 2021-11-09 DIAGNOSIS — M6281 Muscle weakness (generalized): Secondary | ICD-10-CM

## 2021-11-09 DIAGNOSIS — R2689 Other abnormalities of gait and mobility: Secondary | ICD-10-CM

## 2021-11-09 DIAGNOSIS — J189 Pneumonia, unspecified organism: Secondary | ICD-10-CM | POA: Diagnosis not present

## 2021-11-09 DIAGNOSIS — R29898 Other symptoms and signs involving the musculoskeletal system: Secondary | ICD-10-CM | POA: Diagnosis not present
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
## 2021-11-12 ENCOUNTER — Ambulatory Visit (HOSPITAL_COMMUNITY): Payer: Medicare Other | Admitting: Physical Therapy

## 2021-11-12 ENCOUNTER — Other Ambulatory Visit: Payer: Self-pay | Admitting: *Deleted

## 2021-11-12 ENCOUNTER — Encounter (HOSPITAL_COMMUNITY): Payer: Self-pay | Admitting: Physical Therapy

## 2021-11-12 ENCOUNTER — Telehealth: Payer: Self-pay

## 2021-11-12 ENCOUNTER — Other Ambulatory Visit: Payer: Self-pay

## 2021-11-12 DIAGNOSIS — M6281 Muscle weakness (generalized): Secondary | ICD-10-CM

## 2021-11-12 DIAGNOSIS — R29898 Other symptoms and signs involving the musculoskeletal system: Secondary | ICD-10-CM

## 2021-11-12 DIAGNOSIS — J189 Pneumonia, unspecified organism: Secondary | ICD-10-CM | POA: Diagnosis not present

## 2021-11-12 DIAGNOSIS — R2689 Other abnormalities of gait and mobility: Secondary | ICD-10-CM | POA: Diagnosis not present

## 2021-11-12 DIAGNOSIS — N400 Enlarged prostate without lower urinary tract symptoms: Secondary | ICD-10-CM

## 2021-11-12 MED ORDER — TAMSULOSIN HCL 0.4 MG PO CAPS: ORAL_CAPSULE | ORAL | 2 refills | Status: DC
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
## 2021-11-12 NOTE — Telephone Encounter (Signed)
Patient came by the office and said [facility] has not received this medicine tamsulosin (FLOMAX) 0.4 MG CAPS capsule  yet.

## 2021-11-12 NOTE — Telephone Encounter (Signed)
Medication sent to pharmacy  

## 2021-11-14 ENCOUNTER — Encounter (HOSPITAL_COMMUNITY): Payer: Self-pay | Admitting: Physical Therapy

## 2021-11-14 ENCOUNTER — Other Ambulatory Visit: Payer: Self-pay

## 2021-11-14 ENCOUNTER — Ambulatory Visit (HOSPITAL_COMMUNITY): Payer: Medicare Other | Attending: Family Medicine | Admitting: Physical Therapy

## 2021-11-14 DIAGNOSIS — M6281 Muscle weakness (generalized): Secondary | ICD-10-CM | POA: Diagnosis not present

## 2021-11-14 DIAGNOSIS — R2689 Other abnormalities of gait and mobility: Secondary | ICD-10-CM | POA: Diagnosis not present

## 2021-11-14 DIAGNOSIS — R29898 Other symptoms and signs involving the musculoskeletal system: Secondary | ICD-10-CM | POA: Insufficient documentation
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
## 2021-11-19 ENCOUNTER — Encounter (HOSPITAL_COMMUNITY): Payer: Self-pay | Admitting: Physical Therapy

## 2021-11-19 ENCOUNTER — Other Ambulatory Visit: Payer: Self-pay

## 2021-11-19 ENCOUNTER — Ambulatory Visit (HOSPITAL_COMMUNITY): Payer: Medicare Other | Admitting: Physical Therapy

## 2021-11-19 DIAGNOSIS — R29898 Other symptoms and signs involving the musculoskeletal system: Secondary | ICD-10-CM

## 2021-11-19 DIAGNOSIS — R2689 Other abnormalities of gait and mobility: Secondary | ICD-10-CM | POA: Diagnosis not present

## 2021-11-19 DIAGNOSIS — M6281 Muscle weakness (generalized): Secondary | ICD-10-CM
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
## 2021-11-21 ENCOUNTER — Ambulatory Visit (HOSPITAL_COMMUNITY): Payer: Medicare Other | Admitting: Physical Therapy

## 2021-11-23 ENCOUNTER — Other Ambulatory Visit: Payer: Self-pay | Admitting: *Deleted

## 2021-11-23 DIAGNOSIS — E785 Hyperlipidemia, unspecified: Secondary | ICD-10-CM

## 2021-11-26 ENCOUNTER — Encounter (HOSPITAL_COMMUNITY): Payer: Medicare Other | Admitting: Physical Therapy

## 2021-11-28 ENCOUNTER — Encounter (HOSPITAL_COMMUNITY): Payer: Medicare Other

## 2021-11-28 ENCOUNTER — Ambulatory Visit: Payer: Medicare Other | Admitting: Internal Medicine

## 2021-11-29 ENCOUNTER — Telehealth: Payer: Self-pay | Admitting: *Deleted

## 2021-11-29 NOTE — Chronic Care Management (AMB) (Signed)
?  Care Management  ? ?Note ? ?11/29/2021 ?Name: [name] MRN: [id number] DOB: 07/04/1941 ? ?Benjamin [name] is a 81 y.o. year old male who is a primary care patient of Lindell Spar, MD and is actively engaged with the care management team. I reached out to [name] by phone today to assist with scheduling an initial visit with the RN Case Manager ? ?Follow up plan: ?Telephone appointment with care management team member scheduled for:12/11/21 ? ?Laverda Sorenson  ?Care Guide, Embedded Care Coordination ?[facility]  Care Management  ?Direct Dial: [phone] ? ?

## 2021-12-11 ENCOUNTER — Ambulatory Visit (INDEPENDENT_AMBULATORY_CARE_PROVIDER_SITE_OTHER): Payer: Medicare Other | Admitting: *Deleted

## 2021-12-11 DIAGNOSIS — I2510 Atherosclerotic heart disease of native coronary artery without angina pectoris: Secondary | ICD-10-CM

## 2021-12-11 DIAGNOSIS — J449 Chronic obstructive pulmonary disease, unspecified: Secondary | ICD-10-CM

## 2021-12-11 NOTE — Chronic Care Management (AMB) (Signed)
?Chronic Care Management  ? ?CCM RN Visit Note ? ?12/11/2021 ?Name: Benjamin [name] MRN: [id number] DOB: 02-02-1941 ? ?Subjective: ?Benjamin [name] is a 81 y.o. year old male who is a primary care patient of Lindell Spar, MD. The care management team was consulted for assistance with disease management and care coordination needs.   ? ?Engaged with patient by telephone for initial visit in response to provider referral for case management and/or care coordination services.  ? ?Consent to Services:  ?The patient was given the following information about Chronic Care Management services today, agreed to services, and gave verbal consent: 1. CCM service includes personalized support from designated clinical staff supervised by the primary care provider, including individualized plan of care and coordination with other care providers 2. 24/7 contact phone numbers for assistance for urgent and routine care needs. 3. Service will only be billed when office clinical staff spend 20 minutes or more in a month to coordinate care. 4. Only one practitioner may furnish and bill the service in a calendar month. 5.The patient may stop CCM services at any time (effective at the end of the month) by phone call to the office staff. 6. The patient will be responsible for cost sharing (co-pay) of up to 20% of the service fee (after annual deductible is met). Patient agreed to services and consent obtained. ? ?Patient agreed to services and verbal consent obtained.  ? ?Assessment: Review of patient past medical history, allergies, medications, health status, including review of consultants reports, laboratory and other test data, was performed as part of comprehensive evaluation and provision of chronic care management services.  ? ?SDOH (Social Determinants of Health) assessments and interventions performed:  ?SDOH Interventions   ? ?Flowsheet Row Most Recent Value  ?SDOH Interventions   ?Food Insecurity Interventions Intervention Not Indicated   ?Transportation Interventions Intervention Not Indicated  ? ?  ?  ? ?Benjamin [name] ? ?Allergies  ?Allergen Reactions  ? Arsenic Swelling  ?  Severe swelling if patient comes in contact   ? Contrast Media [Iodinated Contrast Media] Swelling and Rash  ? Statins Rash  ?  Joint pain  ? ? ?Outpatient Encounter Medications as of 12/11/2021  ?Medication Sig Note  ? acetaminophen (TYLENOL) 500 MG tablet Take 1,000 mg by mouth every 8 (eight) hours as needed for mild pain.   ? albuterol (VENTOLIN HFA) 108 (90 Base) MCG/ACT inhaler Inhale 1 puff into the lungs every 6 (six) hours as needed.   ? colestipol (COLESTID) 1 g tablet Take 2 g by mouth daily.   ? CRANBERRY EXTRACT PO Take 400 mg by mouth 2 (two) times daily.   ? finasteride (PROSCAR) 5 MG tablet Take 1 tablet (5 mg total) by mouth daily.   ? ipratropium-albuterol (DUONEB) 0.5-2.5 (3) MG/3ML SOLN Take 3 mLs by nebulization every 4 (four) hours as needed. (Patient taking differently: Take 3 mLs by nebulization every 4 (four) hours as needed.)   ? metoprolol tartrate (LOPRESSOR) 25 MG tablet Take 0.5 tablets (12.5 mg total) by mouth 2 (two) times daily.   ? pantoprazole (PROTONIX) 40 MG tablet Take 40 mg by mouth 2 (two) times daily.   ? REPATHA SURECLICK 917 MG/ML SOAJ INJECT 140MG SUBCUTANEOUSLY EVERY 14 DAYS AS DIRECTED BY MD 10/17/2021: Due today 10-17-20  ? tamsulosin (FLOMAX) 0.4 MG CAPS capsule TAKE 2 CAPSULES BY MOUTH ONCE DAILY AFTER SUPPER FOR PROSTATE   ? furosemide (LASIX) 20 MG tablet Take 1 tablet (20 mg total) by mouth every  other day. (Patient not taking: Reported on 12/11/2021)   ? ?No facility-administered encounter medications on file as of 12/11/2021.  ? ? ?Patient Active Problem List  ? Diagnosis Date Noted  ? Hospital discharge follow-up 10/30/2021  ? History of adenomatous polyp of colon 10/30/2021  ? History of duodenal ulcer 10/30/2021  ? 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  ? Grade II hemorrhoids 10/23/2021  ? Rectal bleeding 10/23/2021  ?  Hyperlipidemia LDL goal <70 10/18/2021  ? HCAP (healthcare-associated pneumonia) 10/17/2021  ? Muscle weakness (generalized) 10/16/2021  ? Right inguinal hernia   ? Hernia, umbilical 01/77/9390  ? Thrombocytopenia ([place]) 01/23/2020  ? Chronic allergic rhinitis 12/21/2019  ? Chronic pain syndrome 12/21/2019  ? Chronic insomnia 12/20/2019  ? AK (actinic keratosis) 03/17/2019  ? 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 Coronary artery disease 03/01/2019  ? CAD (coronary artery disease) 02/05/2019  ? Aortic atherosclerosis ([place]) 09/22/2018  ? Prediabetes 09/22/2018  ? Chronic diastolic CHF (congestive heart failure) ([place])   ? Irritable bowel syndrome with diarrhea   ? Coronary artery calcification seen on CAT scan   ? Chronic bilateral thoracic back pain 02/12/2017  ? GERD (gastroesophageal reflux disease) 11/27/2016  ? Constipation 10/28/2016  ? Erectile dysfunction 06/25/2016  ? Chronic diarrhea 06/07/2016  ? COPD GOLD 2 10/20/2015  ? DDD (degenerative disc disease), lumbar 01/11/2014  ? BPH (benign prostatic hyperplasia) 03/09/2012  ? ? ?Conditions to be addressed/monitored:CAD and COPD ? ?Care Plan : RN Care Manager Plan of Care  ?Updates made by Kassie Mends, RN since 12/11/2021 12:00 AM  ?  ? ?Problem: No plan of care established for management of chronic disease state  (COPD, CAD)   ?Priority: High  ?  ? ?Long-Range Goal: Development of plan of care for chronic disease management  (COPD, CAD)   ?Start Date: 12/11/2021  ?Expected End Date: 06/09/2022  ?Priority: High  ?Note:   ?Current Barriers:  ?Knowledge Deficits related to plan of care for management of CAD and COPD  ?Patient reports he lives with spouse, is independent in all aspects of his care, continues to drive, attends outpatient physical therapy due to having multiple falls, reports balance is off, has tinnitus and follows up with audiologist, has cane and walker to use as needed.  Patient reports he does not have CHF although he does weigh  daily, does not take lasix, reports has inhalers and uses as prescribed. ?Patient does not have Advanced directives and requests information be mailed. ? ?RNCM Clinical Goal(s):  ?Patient will verbalize understanding of plan for management of CAD and COPD as evidenced by patient report, review of EHR and  through collaboration with RN Care manager, provider, and care team.  ? ?Interventions: ?1:1 collaboration with primary care provider regarding development and update of comprehensive plan of care as evidenced by provider attestation and co-signature ?Inter-disciplinary care team collaboration (see longitudinal plan of care) ?Evaluation of current treatment plan related to  self management and patient's adherence to plan as established by provider ? ? ?CAD Interventions: (Status:  New goal. and Goal on track:  Yes.) Long Term Goal ?Assessed understanding of CAD diagnosis ?Medications reviewed including medications utilized in CAD treatment plan ?Provided education on importance of blood pressure control in management of CAD ?Counseled on the importance of exercise goals with target of 150 minutes per week ?Reviewed Importance of taking all medications as prescribed ?Reviewed Importance of attending all scheduled provider appointments ?Screening for signs and symptoms of depression related to  chronic disease state ?Assessed social determinant of health barriers ?Education mailed- heart healthy diet ?Advanced directives packet mailed ? ? ?COPD Interventions:  (Status:  New goal. and Goal on track:  Yes.) Long Term Goal ?Provided patient with basic written and verbal COPD education on self care/management/and exacerbation prevention ?Provided instruction about proper use of medications used for management of COPD including inhalers ?Advised patient to self assesses COPD action plan zone and make appointment with provider if in the yellow zone for 48 hours without improvement ?Advised patient to engage in light  exercise as tolerated 3-5 days a week to aid in the the management of COPD ?Provided education about and advised patient to utilize infection prevention strategies to reduce risk of respiratory infection  ?Reviewed saf

## 2021-12-11 NOTE — Patient Instructions (Signed)
Visit Information  ? ?Thank you for taking time to visit with me today. Please don't hesitate to contact me if I can be of assistance to you before our next scheduled telephone appointment. ? ?Following are the goals we discussed today:  ?(Copy and paste patient goals from clinical care plan here) ? ?Our next appointment is by telephone on 02/28/22 at 1045 am ? ?Please call the care guide team at [phone] if you need to cancel or reschedule your appointment.  ? ?If you are experiencing a Mental Health or Behavioral Health Crisis or need someone to talk to, please call the Suicide and Crisis Lifeline: 988 ?call the [place] National Suicide Prevention Lifeline: 1-800-273-8255 or TTY: 1-800-799-4 TTY (1-800-799-4889) to talk to a trained counselor ?call 1-800-273-TALK (toll free, 24 hour hotline) ?go to [facility] [address], [place] ([phone]) ?call the [place] Crisis Line: 800-939-9988 ?call 911  ? ?Following is a copy of your full care plan:  ?Care Plan : RN Care Manager Plan of Care  ?Updates made by Farmer, Julie A, RN since 12/11/2021 12:00 AM  ?  ? ?Problem: No plan of care established for management of chronic disease state  (COPD, CAD)   ?Priority: High  ?  ? ?Long-Range Goal: Development of plan of care for chronic disease management  (COPD, CAD)   ?Start Date: 12/11/2021  ?Expected End Date: 06/09/2022  ?Priority: High  ?Note:   ?Current Barriers:  ?Knowledge Deficits related to plan of care for management of CAD and COPD  ?Patient reports he lives with spouse, is independent in all aspects of his care, continues to drive, attends outpatient physical therapy due to having multiple falls, reports balance is off, has tinnitus and follows up with audiologist, has cane and walker to use as needed.  Patient reports he does not have CHF although he does weigh daily, does not take lasix, reports has inhalers and uses as prescribed. ?Patient does not have  Advanced directives and requests information be mailed. ? ?RNCM Clinical Goal(s):  ?Patient will verbalize understanding of plan for management of CAD and COPD as evidenced by patient report, review of EHR and  through collaboration with RN Care manager, provider, and care team.  ? ?Interventions: ?1:1 collaboration with primary care provider regarding development and update of comprehensive plan of care as evidenced by provider attestation and co-signature ?Inter-disciplinary care team collaboration (see longitudinal plan of care) ?Evaluation of current treatment plan related to  self management and patient's adherence to plan as established by provider ? ? ?CAD Interventions: (Status:  New goal. and Goal on track:  Yes.) Long Term Goal ?Assessed understanding of CAD diagnosis ?Medications reviewed including medications utilized in CAD treatment plan ?Provided education on importance of blood pressure control in management of CAD ?Counseled on the importance of exercise goals with target of 150 minutes per week ?Reviewed Importance of taking all medications as prescribed ?Reviewed Importance of attending all scheduled provider appointments ?Screening for signs and symptoms of depression related to chronic disease state ?Assessed social determinant of health barriers ?Education mailed- heart healthy diet ?Advanced directives packet mailed ? ? ?COPD Interventions:  (Status:  New goal. and Goal on track:  Yes.) Long Term Goal ?Provided patient with basic written and verbal COPD education on self care/management/and exacerbation prevention ?Provided instruction about proper use of medications used for management of COPD including inhalers ?Advised patient to self assesses COPD action plan zone and make appointment with provider if in the   yellow zone for 48 hours without improvement ?Advised patient to engage in light exercise as tolerated 3-5 days a week to aid in the the management of COPD ?Provided education about  and advised patient to utilize infection prevention strategies to reduce risk of respiratory infection  ?Reviewed safety precautions ?Education mailed- COPD action plan ? ?Patient Goals/Self-Care Activities: ?Take medications as prescribed   ?Attend all scheduled provider appointments ?Call pharmacy for medication refills 3-7 days in advance of running out of medications ?Perform all self care activities independently  ?Call provider office for new concerns or questions  ?identify and remove indoor air pollutants ?limit outdoor activity during cold weather ?do breathing exercises every day ?develop a rescue plan ?follow rescue plan if symptoms flare-up ?Look over education mailed- heart healthy diet and COPD action plan ?Advanced directives information mailed- please look over and complete ?Call RN care manager for any questions at [phone] ?fall prevention strategies: change position slowly, use assistive device such as walker or cane (per provider recommendations) when walking, keep walkways clear, have good lighting in room. It is important to contact your provider if you have any falls, maintain muscle strength/tone by exercise per provider recommendations. ? ? ?  ? ? ?Consent to CCM Services: ?Mr. Delaughter was given information about Chronic Care Management services including:  ?CCM service includes personalized support from designated clinical staff supervised by his physician, including individualized plan of care and coordination with other care providers ?24/7 contact phone numbers for assistance for urgent and routine care needs. ?Service will only be billed when office clinical staff spend 20 minutes or more in a month to coordinate care. ?Only one practitioner may furnish and bill the service in a calendar month. ?The patient may stop CCM services at any time (effective at the end of the month) by phone call to the office staff. ?The patient will be responsible for cost sharing (co-pay) of up to 20% of  the service fee (after annual deductible is met). ? ?Patient agreed to services and verbal consent obtained.  ? ?The patient verbalized understanding of instructions, educational materials, and care plan provided today and agreed to receive a mailed copy of patient instructions, educational materials, and care plan.  ? ?Telephone follow up appointment with care management team member scheduled for:  02/28/22 at 39 am ? ?COPD Action Plan ?A COPD action plan is a description of what to do when you have a flare (exacerbation) of chronic obstructive pulmonary disease (COPD). Your action plan is a color-coded plan that lists the symptoms that indicate whether your condition is under control and what actions to take. ?If you have symptoms in the green zone, it means you are doing well that day. ?If you have symptoms in the yellow zone, it means you are having a bad day or an exacerbation. ?If you have symptoms in the red zone, you need urgent medical care. ?Follow the plan that you and your health care provider developed. Review your plan with your health care provider at each visit. ?Red zone ?Symptoms in this zone mean that you should get medical help right away. They include: ?Feeling very short of breath, even when you are resting. ?Not being able to do any activities because of poor breathing. ?Not being able to sleep because of poor breathing. ?Fever or shaking chills. ?Feeling confused or very sleepy. ?Chest pain. ?Coughing up blood. ?If you have any of these symptoms, call emergency services (911 in the [place] or go to the nearest emergency room. ?  Yellow zone ?Symptoms in this zone mean that your condition may be getting worse. They include: ?Feeling more short of breath than usual. ?Having less energy for daily activities than usual. ?Phlegm or mucus that is thicker than usual. ?Needing to use your rescue inhaler or nebulizer more often than usual. ?More ankle swelling than usual. ?Coughing more than  usual. ?Feeling like you have a chest cold. ?Trouble sleeping due to COPD symptoms. ?Decreased appetite. ?COPD medicines not helping as much as usual. ?If you experience any "yellow" symptoms: ?Keep taking your daily med

## 2021-12-14 DIAGNOSIS — I2510 Atherosclerotic heart disease of native coronary artery without angina pectoris: Secondary | ICD-10-CM | POA: Diagnosis not present

## 2021-12-14 DIAGNOSIS — J449 Chronic obstructive pulmonary disease, unspecified: Secondary | ICD-10-CM | POA: Diagnosis not present

## 2021-12-16 ENCOUNTER — Other Ambulatory Visit: Payer: Self-pay | Admitting: Internal Medicine

## 2021-12-18 ENCOUNTER — Encounter: Payer: Self-pay | Admitting: Cardiology

## 2021-12-18 ENCOUNTER — Ambulatory Visit (INDEPENDENT_AMBULATORY_CARE_PROVIDER_SITE_OTHER): Payer: Medicare Other | Admitting: Cardiology

## 2021-12-18 VITALS — BP 90/54 | HR 72 | Ht 71.0 in | Wt 187.0 lb

## 2021-12-18 DIAGNOSIS — I25119 Atherosclerotic heart disease of native coronary artery with unspecified angina pectoris: Secondary | ICD-10-CM

## 2021-12-18 DIAGNOSIS — Z789 Other specified health status: Secondary | ICD-10-CM | POA: Diagnosis not present

## 2021-12-18 DIAGNOSIS — E785 Hyperlipidemia, unspecified: Secondary | ICD-10-CM
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
## 2021-12-18 NOTE — Patient Instructions (Addendum)
Medication Instructions:  ?Your physician recommends that you continue on your current medications as directed. Please refer to the Current Medication list given to you today. ? ?Labwork: ?none ? ?Testing/Procedures: ?none ? ?Follow-Up: ?Your physician recommends that you schedule a follow-up appointment in: 6 months ?Your physician recommends that you schedule a follow-up appointment with Dr. Debara Pickett ? ?Any Other Special Instructions Will Be Listed Below (If Applicable). ? ?If you need a refill on your cardiac medications before your next appointment, please call your pharmacy. ?

## 2021-12-19 LAB — LIPID PANEL
Chol/HDL Ratio: 2.3 ratio (ref 0.0–5.0)
Cholesterol, Total: 91 mg/dL — ABNORMAL LOW (ref 100–199)
HDL: 39 mg/dL — ABNORMAL LOW (ref >39)
LDL Chol Calc (NIH): 37 mg/dL (ref 0–99)
Triglycerides: 69 mg/dL (ref 0–149)
VLDL Cholesterol Cal: 15 mg/dL (ref 5–40)

## 2021-12-20 ENCOUNTER — Encounter: Payer: Self-pay | Admitting: Internal Medicine

## 2021-12-31 DIAGNOSIS — Z20822 Contact with and (suspected) exposure to covid-19: Secondary | ICD-10-CM | POA: Diagnosis not present

## 2022-01-11 DIAGNOSIS — M159 Polyosteoarthritis, unspecified: Secondary | ICD-10-CM | POA: Diagnosis not present

## 2022-01-11 DIAGNOSIS — U071 COVID-19: Secondary | ICD-10-CM | POA: Diagnosis not present

## 2022-01-11 DIAGNOSIS — I2510 Atherosclerotic heart disease of native coronary artery without angina pectoris: Secondary | ICD-10-CM | POA: Diagnosis not present

## 2022-01-11 DIAGNOSIS — Z87891 Personal history of nicotine dependence: Secondary | ICD-10-CM

## 2022-01-11 DIAGNOSIS — Z9181 History of falling: Secondary | ICD-10-CM

## 2022-01-11 DIAGNOSIS — Z951 Presence of aortocoronary bypass graft: Secondary | ICD-10-CM

## 2022-01-11 DIAGNOSIS — N400 Enlarged prostate without lower urinary tract symptoms: Secondary | ICD-10-CM

## 2022-01-11 DIAGNOSIS — Z7952 Long term (current) use of systemic steroids: Secondary | ICD-10-CM

## 2022-01-11 DIAGNOSIS — J441 Chronic obstructive pulmonary disease with (acute) exacerbation: Secondary | ICD-10-CM | POA: Diagnosis not present

## 2022-01-11 DIAGNOSIS — K219 Gastro-esophageal reflux disease without esophagitis: Secondary | ICD-10-CM

## 2022-01-14 ENCOUNTER — Other Ambulatory Visit: Payer: Self-pay | Admitting: Internal Medicine

## 2022-01-14 DIAGNOSIS — N400 Enlarged prostate without lower urinary tract symptoms: Secondary | ICD-10-CM

## 2022-01-15 DIAGNOSIS — Z9181 History of falling: Secondary | ICD-10-CM | POA: Diagnosis not present

## 2022-01-15 DIAGNOSIS — Z87891 Personal history of nicotine dependence: Secondary | ICD-10-CM | POA: Diagnosis not present

## 2022-01-15 DIAGNOSIS — K219 Gastro-esophageal reflux disease without esophagitis: Secondary | ICD-10-CM | POA: Diagnosis not present

## 2022-01-15 DIAGNOSIS — Z7952 Long term (current) use of systemic steroids: Secondary | ICD-10-CM | POA: Diagnosis not present

## 2022-01-15 DIAGNOSIS — M159 Polyosteoarthritis, unspecified: Secondary | ICD-10-CM | POA: Diagnosis not present

## 2022-01-15 DIAGNOSIS — J441 Chronic obstructive pulmonary disease with (acute) exacerbation: Secondary | ICD-10-CM | POA: Diagnosis not present

## 2022-01-15 DIAGNOSIS — Z951 Presence of aortocoronary bypass graft: Secondary | ICD-10-CM | POA: Diagnosis not present

## 2022-01-15 DIAGNOSIS — N400 Enlarged prostate without lower urinary tract symptoms: Secondary | ICD-10-CM | POA: Diagnosis not present

## 2022-01-15 DIAGNOSIS — U071 COVID-19: Secondary | ICD-10-CM | POA: Diagnosis not present

## 2022-01-15 DIAGNOSIS — I2510 Atherosclerotic heart disease of native coronary artery without angina pectoris: Secondary | ICD-10-CM | POA: Diagnosis not present

## 2022-01-21 DIAGNOSIS — Z20822 Contact with and (suspected) exposure to covid-19: Secondary | ICD-10-CM | POA: Diagnosis not present

## 2022-01-28 ENCOUNTER — Encounter: Payer: Self-pay | Admitting: Urology

## 2022-01-28 ENCOUNTER — Ambulatory Visit (INDEPENDENT_AMBULATORY_CARE_PROVIDER_SITE_OTHER): Payer: Medicare Other | Admitting: Urology

## 2022-01-28 VITALS — BP 111/65 | HR 79

## 2022-01-28 DIAGNOSIS — R972 Elevated prostate specific antigen [PSA]: Secondary | ICD-10-CM

## 2022-01-28 DIAGNOSIS — R3912 Poor urinary stream: Secondary | ICD-10-CM | POA: Diagnosis not present

## 2022-01-28 DIAGNOSIS — N401 Enlarged prostate with lower urinary tract symptoms: Secondary | ICD-10-CM | POA: Diagnosis not present

## 2022-01-28 DIAGNOSIS — I25119 Atherosclerotic heart disease of native coronary artery with unspecified angina pectoris: Secondary | ICD-10-CM | POA: Diagnosis not present

## 2022-01-28 LAB — BLADDER SCAN AMB NON-IMAGING: Scan Result: 80
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
## 2022-01-28 NOTE — Progress Notes (Signed)
post void residual=80 

## 2022-01-29 LAB — PSA: Prostate Specific Ag, Serum: 2.5 ng/mL (ref 0.0–4.0)

## 2022-02-04 DIAGNOSIS — S39012A Strain of muscle, fascia and tendon of lower back, initial encounter: Secondary | ICD-10-CM | POA: Diagnosis not present

## 2022-02-04 DIAGNOSIS — M9905 Segmental and somatic dysfunction of pelvic region: Secondary | ICD-10-CM | POA: Diagnosis not present

## 2022-02-04 DIAGNOSIS — M5416 Radiculopathy, lumbar region: Secondary | ICD-10-CM | POA: Diagnosis not present

## 2022-02-04 DIAGNOSIS — M9903 Segmental and somatic dysfunction of lumbar region: Secondary | ICD-10-CM | POA: Diagnosis not present

## 2022-02-12 DIAGNOSIS — M5416 Radiculopathy, lumbar region: Secondary | ICD-10-CM | POA: Diagnosis not present

## 2022-02-12 DIAGNOSIS — S39012A Strain of muscle, fascia and tendon of lower back, initial encounter: Secondary | ICD-10-CM | POA: Diagnosis not present

## 2022-02-12 DIAGNOSIS — M9903 Segmental and somatic dysfunction of lumbar region: Secondary | ICD-10-CM | POA: Diagnosis not present

## 2022-02-12 DIAGNOSIS — M9905 Segmental and somatic dysfunction of pelvic region: Secondary | ICD-10-CM | POA: Diagnosis not present

## 2022-02-14 DIAGNOSIS — S39012A Strain of muscle, fascia and tendon of lower back, initial encounter: Secondary | ICD-10-CM | POA: Diagnosis not present

## 2022-02-14 DIAGNOSIS — M9905 Segmental and somatic dysfunction of pelvic region: Secondary | ICD-10-CM | POA: Diagnosis not present

## 2022-02-14 DIAGNOSIS — M5416 Radiculopathy, lumbar region: Secondary | ICD-10-CM | POA: Diagnosis not present

## 2022-02-14 DIAGNOSIS — M9903 Segmental and somatic dysfunction of lumbar region: Secondary | ICD-10-CM | POA: Diagnosis not present

## 2022-02-18 ENCOUNTER — Ambulatory Visit (INDEPENDENT_AMBULATORY_CARE_PROVIDER_SITE_OTHER): Payer: Medicare Other | Admitting: Urology

## 2022-02-18 ENCOUNTER — Encounter: Payer: Self-pay | Admitting: Urology

## 2022-02-18 VITALS — BP 135/83 | HR 70 | Ht 71.0 in | Wt 194.0 lb

## 2022-02-18 DIAGNOSIS — R3912 Poor urinary stream: Secondary | ICD-10-CM

## 2022-02-18 DIAGNOSIS — N401 Enlarged prostate with lower urinary tract symptoms: Secondary | ICD-10-CM | POA: Diagnosis not present

## 2022-02-18 LAB — URINALYSIS, ROUTINE W REFLEX MICROSCOPIC
Bilirubin, UA: NEGATIVE
Glucose, UA: NEGATIVE
Ketones, UA: NEGATIVE
Nitrite, UA: NEGATIVE
Protein,UA: NEGATIVE
RBC, UA: NEGATIVE
Specific Gravity, UA: 1.025 (ref 1.005–1.030)
Urobilinogen, Ur: 0.2 mg/dL (ref 0.2–1.0)
pH, UA: 5.5 (ref 5.0–7.5)

## 2022-02-18 LAB — MICROSCOPIC EXAMINATION
Epithelial Cells (non renal): NONE SEEN /hpf (ref 0–10)
RBC, Urine: NONE SEEN /hpf (ref 0–2)
Renal Epithel, UA: NONE SEEN /hpf

## 2022-02-18 MED ORDER — CIPROFLOXACIN HCL 500 MG PO TABS
500.0000 mg | ORAL_TABLET | Freq: Once | ORAL | Status: AC
  Administered 2022-02-18: 500 mg via ORAL

## 2022-02-18 NOTE — Progress Notes (Unsigned)
   02/18/22  CC: No chief complaint on file.   HPI: F/u -   1) BPH with lower urinary tract symptoms-he complains of many LUTS. AUA symptom score 26. His prostate measured 65 g on CT scan February 2023 with an otherwise benign CT.  Denies gross hematuria. He is on tamsulosin for years. He started finasteride Feb 2023. Not as sexually active.    Post void 80 mL.     2) PSA elevation-his PSA has ranged from 2.4-4.3.  Associated with a 65 g prostate.  Last PSA April 2021 of 2.4. His May 2023 PSA was 2.5.    3) ED - for many years. Tried pde5i without a lot of success.      Today, Benjamin [name] is seen for the above.  He was hesitant to undergo the cystoscopy and we discussed the nature risk benefits and alternatives to cystoscopy.  He will proceed.  No dysuria or gross hematuria today.   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    Blood pressure 135/83, pulse 70, height '5\' 11"'$  (1.803 m), weight 194 lb (88 kg). NED. A&Ox3.   No respiratory distress   Abd soft, NT, ND Normal phallus with bilateral descended testicles  Cystoscopy Procedure Note  Patient identification was confirmed, informed consent was obtained, and patient was prepped using Betadine solution.  Lidocaine jelly was administered per urethral meatus.     Pre-Procedure: - Inspection reveals a normal caliber ureteral meatus.  Procedure: The flexible cystoscope was introduced without difficulty - No urethral strictures/lesions are present. -Moderate BPH, lateral lobe hypertrophy with a small median lobe, visual obstruction   -Normal bladder neck - Bilateral ureteral orifices identified - Bladder mucosa  reveals no ulcers, tumors, or lesions - No bladder stones -Mild trabeculation  Retroflexion shows BPH, normal bladder / bladder neck   Post-Procedure: - Patient tolerated the procedure well  Assessment/ Plan:  Bph with LUTS -had a long discussion with Georgina Snell and his wife.  He is a good candidate  for a number of procedures.  We discussed medical management versus procedures.  He would not want to start additional medicine and would like to consider procedure.  We went over the nature risk and benefits of UroLift, Rezum, laser vaporization or enucleation and compared those to even with TURP.  His prostate is on the borderline to be large enough to really benefit from aqua ablation.  He would like to proceed with Rezum.  We discussed the nature risks and benefits of Rezum and that flow symptoms typically improve, frequency and urgency type symptoms show a more variable success rate.  This can be related to overactive bladder.  We might need to continue to treat the symptoms.  We discussed risk of bleeding, infection, stricture and incontinence among others.  All questions answered.  We will get him scheduled for Rezum down in [place] if that is feasible.  If not we may need to consider UroLift or laser vaporization  No follow-ups on file.  Festus Aloe, MD

## 2022-02-19 DIAGNOSIS — M9905 Segmental and somatic dysfunction of pelvic region: Secondary | ICD-10-CM | POA: Diagnosis not present

## 2022-02-19 DIAGNOSIS — M9903 Segmental and somatic dysfunction of lumbar region: Secondary | ICD-10-CM | POA: Diagnosis not present

## 2022-02-19 DIAGNOSIS — M5416 Radiculopathy, lumbar region: Secondary | ICD-10-CM | POA: Diagnosis not present

## 2022-02-19 DIAGNOSIS — S39012A Strain of muscle, fascia and tendon of lower back, initial encounter: Secondary | ICD-10-CM | POA: Diagnosis not present

## 2022-02-20 DIAGNOSIS — H903 Sensorineural hearing loss, bilateral: Secondary | ICD-10-CM | POA: Diagnosis not present

## 2022-02-20 DIAGNOSIS — H9313 Tinnitus, bilateral: Secondary | ICD-10-CM | POA: Diagnosis not present

## 2022-02-21 DIAGNOSIS — M5416 Radiculopathy, lumbar region: Secondary | ICD-10-CM | POA: Diagnosis not present

## 2022-02-21 DIAGNOSIS — S39012A Strain of muscle, fascia and tendon of lower back, initial encounter: Secondary | ICD-10-CM | POA: Diagnosis not present

## 2022-02-21 DIAGNOSIS — M9903 Segmental and somatic dysfunction of lumbar region: Secondary | ICD-10-CM | POA: Diagnosis not present

## 2022-02-21 DIAGNOSIS — M9905 Segmental and somatic dysfunction of pelvic region: Secondary | ICD-10-CM | POA: Diagnosis not present

## 2022-02-25 DIAGNOSIS — M9903 Segmental and somatic dysfunction of lumbar region: Secondary | ICD-10-CM | POA: Diagnosis not present

## 2022-02-25 DIAGNOSIS — M5416 Radiculopathy, lumbar region: Secondary | ICD-10-CM | POA: Diagnosis not present

## 2022-02-26 ENCOUNTER — Telehealth: Payer: Self-pay

## 2022-02-26 NOTE — Telephone Encounter (Signed)
Patient called with questions of his surgery date in [place], informed him to reach out to [facility] with questions and that they would contact him with further instructions once his date is set.

## 2022-02-28 ENCOUNTER — Encounter: Payer: Self-pay | Admitting: Internal Medicine

## 2022-02-28 ENCOUNTER — Ambulatory Visit (INDEPENDENT_AMBULATORY_CARE_PROVIDER_SITE_OTHER): Payer: Medicare Other | Admitting: Internal Medicine

## 2022-02-28 ENCOUNTER — Telehealth: Payer: Medicare Other

## 2022-02-28 VITALS — BP 104/60 | HR 74 | Ht 71.0 in | Wt 194.0 lb

## 2022-02-28 DIAGNOSIS — I25119 Atherosclerotic heart disease of native coronary artery with unspecified angina pectoris: Secondary | ICD-10-CM

## 2022-02-28 DIAGNOSIS — T466X5D Adverse effect of antihyperlipidemic and antiarteriosclerotic drugs, subsequent encounter: Secondary | ICD-10-CM

## 2022-02-28 DIAGNOSIS — M791 Myalgia, unspecified site: Secondary | ICD-10-CM | POA: Diagnosis not present

## 2022-02-28 DIAGNOSIS — E782 Mixed hyperlipidemia: Secondary | ICD-10-CM

## 2022-02-28 NOTE — Patient Instructions (Signed)
Medication Instructions:  Your physician recommends that you continue on your current medications as directed. Please refer to the Current Medication list given to you today.   Labwork: None  Testing/Procedures: None  Follow-Up: Follow up with Dr. Debara Pickett in 1 year for Lipids.   Any Other Special Instructions Will Be Listed Below (If Applicable).     If you need a refill on your cardiac medications before your next appointment, please call your pharmacy.
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
## 2022-03-06 ENCOUNTER — Telehealth: Payer: Self-pay | Admitting: Cardiology

## 2022-03-06 NOTE — Telephone Encounter (Signed)
   Pre-operative Risk Assessment    Patient Name: Benjamin [name]  DOB: [birth year] MRN: [id number]      Request for Surgical Clearance    Procedure:   rezum   Date of Surgery:  Clearance 04/29/22                                 Surgeon:  Dr. Festus Aloe Surgeon's Group or Practice Name:  [facility]  Phone number:  [phone] Fax number:  [phone]   Type of Clearance Requested:   - Medical    Type of Anesthesia:   Nitrous oxide   Additional requests/questions:    Signed, Milbert Coulter   03/06/2022, 2:39 PM

## 2022-03-06 NOTE — Telephone Encounter (Signed)
Dr. Debara Pickett You saw this patient last week. Can you please give recommendations on surgical clearance for a rezum procedure?  Thank you Angie

## 2022-03-06 NOTE — Telephone Encounter (Signed)
Ok from a cardiac standpoint for rezum procedure.  Dr. Debara Pickett

## 2022-03-06 NOTE — Telephone Encounter (Signed)
   Name: Benjamin [name]  DOB: 1941-04-29  MRN: [id number]   Primary Cardiologist: Rozann Lesches, MD  Chart reviewed as part of pre-operative protocol coverage.   Benjamin [name] was last seen on 02/28/22 by Dr. Ernestene Kiel. Per Dr. Debara Pickett, patient may proceed with rezum procedure.   Therefore, based on ACC/AHA guidelines, the patient would be at acceptable risk for the planned procedure without further cardiovascular testing.   I will route this recommendation to the requesting party via Epic fax function and remove from pre-op pool. Please call with questions.  Ledora Bottcher, PA 03/06/2022, 3:51 PM

## 2022-03-07 ENCOUNTER — Telehealth: Payer: Self-pay | Admitting: Cardiology

## 2022-03-07 NOTE — Telephone Encounter (Signed)
error 

## 2022-03-12 ENCOUNTER — Ambulatory Visit (INDEPENDENT_AMBULATORY_CARE_PROVIDER_SITE_OTHER): Payer: Medicare Other | Admitting: Internal Medicine

## 2022-03-12 ENCOUNTER — Other Ambulatory Visit (HOSPITAL_COMMUNITY): Payer: Self-pay | Admitting: Internal Medicine

## 2022-03-12 ENCOUNTER — Encounter: Payer: Self-pay | Admitting: Internal Medicine

## 2022-03-12 VITALS — BP 118/78 | HR 77 | Resp 18 | Ht 71.0 in | Wt 195.0 lb

## 2022-03-12 DIAGNOSIS — N400 Enlarged prostate without lower urinary tract symptoms: Secondary | ICD-10-CM

## 2022-03-12 DIAGNOSIS — N644 Mastodynia: Secondary | ICD-10-CM | POA: Diagnosis not present

## 2022-03-12 DIAGNOSIS — M5416 Radiculopathy, lumbar region: Secondary | ICD-10-CM | POA: Diagnosis not present

## 2022-03-12 DIAGNOSIS — M9903 Segmental and somatic dysfunction of lumbar region: Secondary | ICD-10-CM | POA: Diagnosis not present

## 2022-03-20 DIAGNOSIS — M9903 Segmental and somatic dysfunction of lumbar region: Secondary | ICD-10-CM | POA: Diagnosis not present

## 2022-03-20 DIAGNOSIS — M5416 Radiculopathy, lumbar region: Secondary | ICD-10-CM | POA: Diagnosis not present

## 2022-04-02 ENCOUNTER — Ambulatory Visit (HOSPITAL_COMMUNITY)
Admission: RE | Admit: 2022-04-02 | Discharge: 2022-04-02 | Disposition: A | Discharge status: Home / Self Care | Payer: Medicare Other | Source: Ambulatory Visit | Attending: Internal Medicine | Admitting: Internal Medicine

## 2022-04-02 ENCOUNTER — Encounter (HOSPITAL_COMMUNITY): Payer: Self-pay

## 2022-04-02 DIAGNOSIS — N62 Hypertrophy of breast: Secondary | ICD-10-CM | POA: Diagnosis not present

## 2022-04-02 DIAGNOSIS — N644 Mastodynia: Secondary | ICD-10-CM

## 2022-04-03 DIAGNOSIS — M5416 Radiculopathy, lumbar region: Secondary | ICD-10-CM | POA: Diagnosis not present

## 2022-04-03 DIAGNOSIS — M9903 Segmental and somatic dysfunction of lumbar region: Secondary | ICD-10-CM | POA: Diagnosis not present

## 2022-04-09 ENCOUNTER — Ambulatory Visit (INDEPENDENT_AMBULATORY_CARE_PROVIDER_SITE_OTHER): Payer: Medicare Other | Admitting: *Deleted

## 2022-04-09 DIAGNOSIS — I2510 Atherosclerotic heart disease of native coronary artery without angina pectoris: Secondary | ICD-10-CM

## 2022-04-09 DIAGNOSIS — J449 Chronic obstructive pulmonary disease, unspecified: Secondary | ICD-10-CM

## 2022-04-09 NOTE — Patient Instructions (Signed)
Visit Information  Thank you for taking time to visit with me today. Please don't hesitate to contact me if I can be of assistance to you before our next scheduled telephone appointment.  Following are the goals we discussed today:  Take medications as prescribed   Attend all scheduled provider appointments Call pharmacy for medication refills 3-7 days in advance of running out of medications Perform all self care activities independently  Call provider office for new concerns or questions  identify and remove indoor air pollutants limit outdoor activity during cold weather do breathing exercises every day develop a rescue plan follow rescue plan if symptoms flare-up Follow heart healthy diet Follow COPD action plan Case closure today fall prevention strategies: change position slowly, use assistive device such as walker or cane (per provider recommendations) when walking, keep walkways clear, have good lighting in room. It is important to contact your provider if you have any falls, maintain muscle strength/tone by exercise per provider recommendations.  Please call the care guide team at [phone] if you need to cancel or reschedule your appointment.   If you are experiencing a Mental Health or [place] or need someone to talk to, please call the Suicide and Crisis Lifeline: 988 call the [place] National Suicide Prevention Lifeline: 5184022920 or TTY: (936) 774-6496 TTY 207-697-9391) to talk to a trained counselor call 1-800-273-TALK (toll free, 24 hour hotline) go to [facility] [address], [place] [phone]) call the [place]: [phone] call 911   The patient verbalized understanding of instructions, educational materials, and care plan provided today and DECLINED offer to receive copy of patient instructions, educational materials, and care plan.   Jacqlyn Larsen [facility], BSN RN Case  Manager [place] [place] [facility] [phone]

## 2022-04-09 NOTE — Chronic Care Management (AMB) (Signed)
Chronic Care Management   CCM RN Visit Note  04/09/2022 Name: Benjamin [name] MRN: [id number] DOB: 1941-08-30  Subjective: Benjamin [name] is a 81 y.o. year old male who is a primary care patient of Lindell Spar, MD. The care management team was consulted for assistance with disease management and care coordination needs.    Engaged with patient by telephone for follow up visit in response to provider referral for case management and/or care coordination services.   Consent to Services:  The patient was given information about Chronic Care Management services, agreed to services, and gave verbal consent prior to initiation of services.  Please see initial visit note for detailed documentation.   Patient agreed to services and verbal consent obtained.   Assessment: Review of patient past medical history, allergies, medications, health status, including review of consultants reports, laboratory and other test data, was performed as part of comprehensive evaluation and provision of chronic care management services.   SDOH (Social Determinants of Health) assessments and interventions performed:    CCM Care Plan  Allergies  Allergen Reactions   Arsenic Swelling    Severe swelling if patient comes in contact    Contrast Media [Iodinated Contrast Media] Swelling and Rash   Statins Rash    Joint pain    Outpatient Encounter Medications as of 04/09/2022  Medication Sig   acetaminophen (TYLENOL) 500 MG tablet Take 1,000 mg by mouth every 8 (eight) hours as needed for mild pain.   colestipol (COLESTID) 1 g tablet Take 2 g by mouth daily.   CRANBERRY EXTRACT PO Take 400 mg by mouth 2 (two) times daily.   Evolocumab (REPATHA SURECLICK [place]) Inject 160 mg into the skin  twice a month on the 1st and the 15th   metoprolol tartrate (LOPRESSOR) 25 MG tablet Take 0.5 tablets (12.5 mg total) by mouth 2 (two) times daily.   pantoprazole (PROTONIX) 40 MG tablet Take 40 mg by mouth 2 (two) times daily.    tamsulosin (FLOMAX) 0.4 MG CAPS capsule TAKE 2 CAPSULES BY MOUTH ONCE DAILY AFTER SUPPER FOR PROSTATE   finasteride (PROSCAR) 5 MG tablet Take 1 tablet (5 mg total) by mouth daily. (Patient not taking: Reported on 04/09/2022)   No facility-administered encounter medications on file as of 04/09/2022.    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   Grade II hemorrhoids 10/23/2021   Rectal bleeding 10/23/2021   Hyperlipidemia LDL goal <70 10/18/2021   HCAP (healthcare-associated pneumonia) 10/17/2021   Muscle weakness (generalized) 10/16/2021   Right inguinal hernia    Hernia, umbilical 10/93/2355   Thrombocytopenia (HCC) 01/23/2020   Chronic allergic rhinitis 12/21/2019   Chronic pain syndrome 12/21/2019   Chronic insomnia 12/20/2019   AK (actinic keratosis) 03/17/2019   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   Coronary artery disease 03/01/2019   CAD (coronary artery disease) 02/05/2019   Aortic atherosclerosis ([place]) 09/22/2018   Prediabetes 09/22/2018   Chronic diastolic CHF (congestive heart failure) (HCC)    Irritable bowel syndrome with diarrhea    Coronary artery calcification seen on CAT scan    Chronic bilateral thoracic back pain 02/12/2017   GERD (gastroesophageal reflux disease) 11/27/2016   Constipation 10/28/2016   Erectile dysfunction 06/25/2016   Chronic diarrhea 06/07/2016   COPD GOLD 2 10/20/2015  DDD (degenerative disc disease), lumbar 01/11/2014   BPH (benign prostatic hyperplasia) 03/09/2012    Conditions to be addressed/monitored:CAD and COPD  Care Plan : RN Care Manager Plan of Care  Updates made by Kassie Mends, RN since 04/09/2022 12:00 AM  Completed 04/09/2022   Problem: No plan of care established for management of chronic disease state   (COPD, CAD) Resolved 04/09/2022  Priority: High     Long-Range Goal: Development of plan of care for chronic disease management  (COPD, CAD) Completed 04/09/2022  Start Date: 12/11/2021  Expected End Date: 06/09/2022  Priority: High  Note:   Current Barriers:  Knowledge Deficits related to plan of care for management of CAD and COPD  Patient reports he lives with spouse, is independent in all aspects of his care, continues to drive, attends outpatient physical therapy due to having multiple falls, reports balance is off, has tinnitus and follows up with audiologist, has cane and walker to use as needed.  Patient reports he does not have CHF although he does weigh daily, does not take lasix, reports has inhalers and uses as prescribed. Patient reports he does monitor blood pressure on occasion, states finasteride has been discontinued per pt " due to affecting my hormone levels and have made my nipples sore"   No other concerns reported today.  RNCM Clinical Goal(s):  Patient will verbalize understanding of plan for management of CAD and COPD as evidenced by patient report, review of EHR and  through collaboration with RN Care manager, provider, and care team.   Interventions: 1:1 collaboration with primary care provider regarding development and update of comprehensive plan of care as evidenced by provider attestation and co-signature Inter-disciplinary care team collaboration (see longitudinal plan of care) Evaluation of current treatment plan related to  self management and patient's adherence to plan as established by provider   CAD Interventions: (Status:  New goal. and Goal on track:  Yes.) Long Term Goal Assessed understanding of CAD diagnosis Medications reviewed including medications utilized in CAD treatment plan Counseled on the importance of exercise goals with target of 150 minutes per week Reviewed Importance of taking all medications as prescribed Reviewed Importance of  attending all scheduled provider appointments Reviewed heart healthy diet   COPD Interventions:  (Status:  New goal. and Goal on track:  Yes.) Long Term Goal Provided instruction about proper use of medications used for management of COPD including inhalers Advised patient to self assesses COPD action plan zone and make appointment with provider if in the yellow zone for 48 hours without improvement Advised patient to engage in light exercise as tolerated 3-5 days a week to aid in the the management of COPD  Reinforced safety precautions Reviewed plan of care with patient including case closure today  Patient Goals/Self-Care Activities: Take medications as prescribed   Attend all scheduled provider appointments Call pharmacy for medication refills 3-7 days in advance of running out of medications Perform all self care activities independently  Call provider office for new concerns or questions  identify and remove indoor air pollutants limit outdoor activity during cold weather do breathing exercises every day develop a rescue plan follow rescue plan if symptoms flare-up Follow heart healthy diet Follow COPD action plan Case closure today fall prevention strategies: change position slowly, use assistive device such as walker or cane (per provider recommendations) when walking, keep walkways clear, have good lighting in room. It is important to contact your provider if you have any falls, maintain muscle strength/tone  by exercise per provider recommendations.       Plan:No further follow up required: case closure  Jacqlyn Larsen [facility], BSN RN Case Manager [facility] [phone]

## 2022-04-15 DIAGNOSIS — I2510 Atherosclerotic heart disease of native coronary artery without angina pectoris: Secondary | ICD-10-CM | POA: Diagnosis not present

## 2022-04-15 DIAGNOSIS — J449 Chronic obstructive pulmonary disease, unspecified: Secondary | ICD-10-CM | POA: Diagnosis not present

## 2022-04-29 ENCOUNTER — Ambulatory Visit: Payer: Medicare Other | Admitting: Internal Medicine

## 2022-05-06 ENCOUNTER — Ambulatory Visit: Payer: Medicare Other | Admitting: Physician Assistant

## 2022-05-14 ENCOUNTER — Other Ambulatory Visit: Payer: Self-pay | Admitting: Cardiology

## 2022-05-22 ENCOUNTER — Ambulatory Visit (INDEPENDENT_AMBULATORY_CARE_PROVIDER_SITE_OTHER): Payer: Medicare PPO | Admitting: Internal Medicine

## 2022-05-22 ENCOUNTER — Encounter: Payer: Self-pay | Admitting: Internal Medicine

## 2022-05-22 VITALS — BP 118/62 | HR 62 | Resp 18 | Ht 71.0 in | Wt 192.4 lb

## 2022-05-22 DIAGNOSIS — E559 Vitamin D deficiency, unspecified: Secondary | ICD-10-CM

## 2022-05-22 DIAGNOSIS — N401 Enlarged prostate with lower urinary tract symptoms: Secondary | ICD-10-CM

## 2022-05-22 DIAGNOSIS — H6123 Impacted cerumen, bilateral: Secondary | ICD-10-CM

## 2022-05-22 DIAGNOSIS — K219 Gastro-esophageal reflux disease without esophagitis: Secondary | ICD-10-CM | POA: Diagnosis not present

## 2022-05-22 DIAGNOSIS — R7303 Prediabetes: Secondary | ICD-10-CM | POA: Diagnosis not present

## 2022-05-22 DIAGNOSIS — K580 Irritable bowel syndrome with diarrhea: Secondary | ICD-10-CM

## 2022-05-22 DIAGNOSIS — E785 Hyperlipidemia, unspecified: Secondary | ICD-10-CM

## 2022-05-22 DIAGNOSIS — Z23 Encounter for immunization: Secondary | ICD-10-CM | POA: Diagnosis not present

## 2022-05-22 DIAGNOSIS — I2510 Atherosclerotic heart disease of native coronary artery without angina pectoris: Secondary | ICD-10-CM

## 2022-05-22 DIAGNOSIS — R350 Frequency of micturition: Secondary | ICD-10-CM

## 2022-05-22 DIAGNOSIS — J449 Chronic obstructive pulmonary disease, unspecified: Secondary | ICD-10-CM

## 2022-05-22 DIAGNOSIS — R351 Nocturia: Secondary | ICD-10-CM

## 2022-05-22 DIAGNOSIS — N62 Hypertrophy of breast: Secondary | ICD-10-CM | POA: Insufficient documentation

## 2022-05-22 LAB — POCT GLYCOSYLATED HEMOGLOBIN (HGB A1C)
HbA1c, POC (controlled diabetic range): 5.6 % (ref 0.0–7.0)
Hemoglobin A1C: 5.6 % (ref 4.0–5.6)

## 2022-05-22 MED ORDER — PANTOPRAZOLE SODIUM 40 MG PO TBEC: 40.0000 mg | DELAYED_RELEASE_TABLET | Freq: Two times a day (BID) | ORAL | 1 refills | Status: DC

## 2022-05-22 MED ORDER — FINASTERIDE 5 MG PO TABS: 5.0000 mg | ORAL_TABLET | Freq: Every day | ORAL | 5 refills | Status: DC

## 2022-05-22 NOTE — Assessment & Plan Note (Addendum)
Well-controlled with Flomax 0.8 mg QD Held finasteride 5 mg daily as he had nipple pain, but has nipple pain even without Finasteride - restarted Finasteride F/u with Urology - referred to [facility] as he requested different Urology referral

## 2022-05-22 NOTE — Assessment & Plan Note (Signed)
On pantoprazole 40 mg twice daily Followed by GI

## 2022-05-22 NOTE — Patient Instructions (Signed)
Please continue taking medications as prescribed.  Please continue to follow low salt diet and ambulate as tolerated. 

## 2022-05-22 NOTE — Assessment & Plan Note (Signed)
Lab Results  Component Value Date   HGBA1C 5.6 05/22/2022   HGBA1C 5.6 05/22/2022   Advised to follow low carb diet for now

## 2022-05-22 NOTE — Assessment & Plan Note (Signed)
S/p CABG Followed by cardiology On metoprolol Denies chest pain or dyspnea currently On Repatha -has statin intolerance
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
## 2022-05-22 NOTE — Assessment & Plan Note (Addendum)
Takes Colestipol Followed by GI

## 2022-05-22 NOTE — Assessment & Plan Note (Signed)
Currently asymptomatic 

## 2022-05-22 NOTE — Assessment & Plan Note (Signed)
B/l ear irrigation done today, he tolerated procedure well Has hearing aids, needs to use brush to clean wax from it

## 2022-05-22 NOTE — Assessment & Plan Note (Signed)
On Repatha -has statin intolerance

## 2022-05-22 NOTE — Assessment & Plan Note (Signed)
Right sided, with mild gynecomastia noted on mammography Advised to hold Finasteride for now as it can also cause gynecomastia, but it did not improve his symptoms

## 2022-06-03 ENCOUNTER — Encounter: Payer: Self-pay | Admitting: Urology

## 2022-06-03 ENCOUNTER — Ambulatory Visit (INDEPENDENT_AMBULATORY_CARE_PROVIDER_SITE_OTHER): Payer: Medicare PPO | Admitting: Urology

## 2022-06-03 VITALS — BP 107/69 | HR 92

## 2022-06-03 DIAGNOSIS — N401 Enlarged prostate with lower urinary tract symptoms: Secondary | ICD-10-CM | POA: Diagnosis not present

## 2022-06-03 DIAGNOSIS — R3912 Poor urinary stream: Secondary | ICD-10-CM | POA: Diagnosis not present

## 2022-06-03 MED ORDER — SILDENAFIL CITRATE 20 MG PO TABS: 20.0000 mg | ORAL_TABLET | Freq: Every day | ORAL | 11 refills | Status: DC
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
## 2022-06-05 ENCOUNTER — Other Ambulatory Visit: Payer: Self-pay | Admitting: Urology

## 2022-06-14 DIAGNOSIS — M25511 Pain in right shoulder: Secondary | ICD-10-CM | POA: Diagnosis not present

## 2022-06-19 ENCOUNTER — Encounter: Payer: Self-pay | Admitting: *Deleted

## 2022-06-19 ENCOUNTER — Encounter: Payer: Self-pay | Admitting: Cardiology

## 2022-06-19 ENCOUNTER — Ambulatory Visit: Payer: Medicare PPO | Attending: Cardiology | Admitting: Cardiology

## 2022-06-19 VITALS — BP 118/74 | HR 85 | Ht 71.5 in | Wt 192.6 lb

## 2022-06-19 DIAGNOSIS — Z01810 Encounter for preprocedural cardiovascular examination: Secondary | ICD-10-CM | POA: Diagnosis not present

## 2022-06-19 DIAGNOSIS — T466X5D Adverse effect of antihyperlipidemic and antiarteriosclerotic drugs, subsequent encounter: Secondary | ICD-10-CM

## 2022-06-19 DIAGNOSIS — T466X5A Adverse effect of antihyperlipidemic and antiarteriosclerotic drugs, initial encounter: Secondary | ICD-10-CM

## 2022-06-19 DIAGNOSIS — E782 Mixed hyperlipidemia: Secondary | ICD-10-CM | POA: Diagnosis not present

## 2022-06-19 DIAGNOSIS — I25119 Atherosclerotic heart disease of native coronary artery with unspecified angina pectoris: Secondary | ICD-10-CM | POA: Diagnosis not present

## 2022-06-19 DIAGNOSIS — M791 Myalgia, unspecified site: Secondary | ICD-10-CM | POA: Diagnosis not present

## 2022-06-19 DIAGNOSIS — R079 Chest pain, unspecified: Secondary | ICD-10-CM

## 2022-06-19 NOTE — Patient Instructions (Addendum)
Medication Instructions:  Your physician recommends that you continue on your current medications as directed. Please refer to the Current Medication list given to you today.  Labwork: none  Testing/Procedures: Your physician has requested that you have a lexiscan myoview. For further information please visit [email or web address]. Please follow instruction sheet, as given.  Follow-Up: Your physician recommends that you schedule a follow-up appointment in: 6 months  Any Other Special Instructions Will Be Listed Below (If Applicable).  If you need a refill on your cardiac medications before your next appointment, please call your pharmacy. 
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
## 2022-06-24 ENCOUNTER — Ambulatory Visit (HOSPITAL_COMMUNITY)
Admission: RE | Admit: 2022-06-24 | Discharge: 2022-06-24 | Disposition: A | Discharge status: Home / Self Care | Payer: Medicare PPO | Source: Ambulatory Visit | Attending: Cardiology | Admitting: Cardiology

## 2022-06-24 ENCOUNTER — Ambulatory Visit (HOSPITAL_BASED_OUTPATIENT_CLINIC_OR_DEPARTMENT_OTHER)
Admission: RE | Admit: 2022-06-24 | Discharge: 2022-06-24 | Disposition: A | Discharge status: Home / Self Care | Payer: Medicare PPO | Source: Ambulatory Visit | Attending: Cardiology | Admitting: Cardiology

## 2022-06-24 DIAGNOSIS — I25119 Atherosclerotic heart disease of native coronary artery with unspecified angina pectoris: Secondary | ICD-10-CM | POA: Diagnosis not present

## 2022-06-24 DIAGNOSIS — R079 Chest pain, unspecified: Secondary | ICD-10-CM | POA: Insufficient documentation

## 2022-06-24 LAB — NM MYOCAR MULTI W/SPECT W/WALL MOTION / EF
LV dias vol: 49 mL (ref 62–150)
LV sys vol: 14 mL
Nuc Stress EF: 71 %
Peak HR: 127 {beats}/min
RATE: 0.6
Rest HR: 60 {beats}/min
Rest Nuclear Isotope Dose: 10.5 mCi
SDS: 0
SRS: 2
SSS: 2
ST Depression (mm): 0 mm
Stress Nuclear Isotope Dose: 30 mCi
TID: 1.51

## 2022-06-24 MED ORDER — SODIUM CHLORIDE FLUSH 0.9 % IV SOLN
INTRAVENOUS | Status: AC
  Administered 2022-06-24: 10 mL
  Filled 2022-06-24: qty 10

## 2022-06-24 MED ORDER — REGADENOSON 0.4 MG/5ML IV SOLN
INTRAVENOUS | Status: AC
  Administered 2022-06-24: 0.4 mg
  Filled 2022-06-24: qty 5

## 2022-06-24 MED ORDER — TECHNETIUM TC 99M TETROFOSMIN IV KIT
30.0000 | PACK | Freq: Once | INTRAVENOUS | Status: AC | PRN
  Administered 2022-06-24: 30 via INTRAVENOUS

## 2022-06-24 MED ORDER — TECHNETIUM TC 99M TETROFOSMIN IV KIT
10.0000 | PACK | Freq: Once | INTRAVENOUS | Status: AC | PRN
  Administered 2022-06-24: 10.5 via INTRAVENOUS

## 2022-06-25 ENCOUNTER — Ambulatory Visit (INDEPENDENT_AMBULATORY_CARE_PROVIDER_SITE_OTHER): Payer: Medicare PPO | Admitting: Internal Medicine

## 2022-06-25 ENCOUNTER — Encounter: Payer: Self-pay | Admitting: Internal Medicine

## 2022-06-25 DIAGNOSIS — J441 Chronic obstructive pulmonary disease with (acute) exacerbation: Secondary | ICD-10-CM

## 2022-06-25 MED ORDER — ALBUTEROL SULFATE HFA 108 (90 BASE) MCG/ACT IN AERS: 1.0000 | INHALATION_SPRAY | Freq: Four times a day (QID) | RESPIRATORY_TRACT | 2 refills | Status: DC | PRN

## 2022-06-25 MED ORDER — METHYLPREDNISOLONE 4 MG PO TBPK: ORAL_TABLET | ORAL | 0 refills | Status: DC

## 2022-06-25 MED ORDER — AZITHROMYCIN 250 MG PO TABS: ORAL_TABLET | ORAL | 0 refills | Status: AC
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
## 2022-06-25 NOTE — Assessment & Plan Note (Addendum)
Recent worsening of cough and dyspnea likely due to COPD exacerbation/acute bronchitis Started azithromycin Medrol Dosepak Refilled albuterol as needed for dyspnea or wheezing

## 2022-06-27 ENCOUNTER — Encounter (HOSPITAL_BASED_OUTPATIENT_CLINIC_OR_DEPARTMENT_OTHER): Payer: Self-pay | Admitting: Urology

## 2022-06-28 NOTE — Progress Notes (Signed)
Reviewed pt chart for pre-op interview for surgery '@[facility]  on 07-05-2022 by Dr Junious Silk.  Pt did get cardiac clearance after his nuclear stress test on 06-24-2022 by Dr Domenic Polite.  However, had pcp visit the next day 06-25-2022 note in epic , pt has cough, congestion, worsening sob , and chest tightness. Note stated pt said he had negative home covid test.  Pcp dx pt w/ acute exacerbation of copd and bronchitis was given antibiotic and prednisone.  Due to respiratory status change reviewed  chart w/ anesthesia, Dr Jenita Seashore [facility].  Dr Glennon Mac recommends pt wait another week to have surgery however if pt were to be completely symptoms free by scheduled surgery on 07-05-2022 he could get done but Dr Glennon Mac it still would be better if pt waiting another week due to his copd. Called and left message for Pam, OR scheduler for Dr Junious Silk, informed her pf status and anesthesia recommendation.j

## 2022-07-01 DIAGNOSIS — M19011 Primary osteoarthritis, right shoulder: Secondary | ICD-10-CM | POA: Diagnosis not present

## 2022-07-01 DIAGNOSIS — M25511 Pain in right shoulder: Secondary | ICD-10-CM | POA: Diagnosis not present

## 2022-07-01 DIAGNOSIS — M6788 Other specified disorders of synovium and tendon, other site: Secondary | ICD-10-CM | POA: Diagnosis not present

## 2022-07-01 DIAGNOSIS — M75111 Incomplete rotator cuff tear or rupture of right shoulder, not specified as traumatic: Secondary | ICD-10-CM | POA: Diagnosis not present

## 2022-07-05 HISTORY — DX: Unspecified rotator cuff tear or rupture of right shoulder, not specified as traumatic: M75.101

## 2022-07-09 DIAGNOSIS — M25511 Pain in right shoulder: Secondary | ICD-10-CM | POA: Diagnosis not present

## 2022-07-11 ENCOUNTER — Encounter: Payer: Medicare PPO | Admitting: Physician Assistant

## 2022-07-16 ENCOUNTER — Encounter (HOSPITAL_BASED_OUTPATIENT_CLINIC_OR_DEPARTMENT_OTHER): Payer: Self-pay | Admitting: Urology

## 2022-07-17 ENCOUNTER — Encounter (HOSPITAL_BASED_OUTPATIENT_CLINIC_OR_DEPARTMENT_OTHER): Payer: Self-pay | Admitting: Urology
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
## 2022-07-18 NOTE — Anesthesia Preprocedure Evaluation (Addendum)
Anesthesia Evaluation  Patient identified by MRN, date of birth, ID band Patient awake    Reviewed: Allergy & Precautions, NPO status , Patient's Chart, lab work & pertinent test results  Airway Mallampati: I  TM Distance: >3 FB Neck ROM: Full    Dental no notable dental hx. (+) Edentulous Upper, Edentulous Lower   Pulmonary COPD, former smoker   Pulmonary exam normal breath sounds clear to auscultation       Cardiovascular + CAD and +CHF  Normal cardiovascular exam Rhythm:Regular Rate:Normal  09/2021 TTE  1. Left ventricular ejection fraction, by estimation, is 60 to 65%. The  left ventricle has normal function. The left ventricle has no regional  wall motion abnormalities. Left ventricular diastolic parameters are  consistent with Grade I diastolic  dysfunction (impaired relaxation).   2. Right ventricular systolic function is normal. The right ventricular  size is normal. Tricuspid regurgitation signal is inadequate for assessing  PA pressure.   3. The mitral valve is grossly normal. Trivial mitral valve  regurgitation.   4. The aortic valve is tricuspid. Aortic valve regurgitation is not  visualized. Aortic valve sclerosis/calcification is present, without any  evidence of aortic stenosis.   5. Aortic dilatation noted. There is borderline dilatation of the aortic  root, measuring 39 mm.   6. Prominent epicardial fat layer.     Neuro/Psych  Headaches    GI/Hepatic hiatal hernia,GERD  ,,  Endo/Other    Renal/GU      Musculoskeletal  (+) Arthritis ,    Abdominal   Peds  Hematology Lab Results      Component                Value               Date                           HGB                      16.0                07/19/2022                HCT                      47.0                07/19/2022                  Anesthesia Other Findings   Reproductive/Obstetrics                              Anesthesia Physical Anesthesia Plan  ASA: 3  Anesthesia Plan: General   Post-op Pain Management:    Induction: Intravenous  PONV Risk Score and Plan: Treatment may vary due to age or medical condition and Ondansetron  Airway Management Planned: LMA  Additional Equipment: None  Intra-op Plan:   Post-operative Plan:   Informed Consent: I have reviewed the patients History and Physical, chart, labs and discussed the procedure including the risks, benefits and alternatives for the proposed anesthesia with the patient or authorized representative who has indicated his/her understanding and acceptance.     Dental advisory given  Plan Discussed with:   Anesthesia Plan Comments:         Anesthesia Quick  Evaluation

## 2022-07-19 ENCOUNTER — Encounter (HOSPITAL_BASED_OUTPATIENT_CLINIC_OR_DEPARTMENT_OTHER): Payer: Self-pay | Admitting: Urology

## 2022-07-19 ENCOUNTER — Encounter (HOSPITAL_BASED_OUTPATIENT_CLINIC_OR_DEPARTMENT_OTHER)
Admission: RE | Disposition: A | Discharge status: Home / Self Care | Payer: Self-pay | Source: Home / Self Care | Attending: Urology

## 2022-07-19 ENCOUNTER — Ambulatory Visit (HOSPITAL_BASED_OUTPATIENT_CLINIC_OR_DEPARTMENT_OTHER): Payer: Medicare PPO | Admitting: Anesthesiology

## 2022-07-19 ENCOUNTER — Ambulatory Visit (HOSPITAL_BASED_OUTPATIENT_CLINIC_OR_DEPARTMENT_OTHER)
Admission: RE | Admit: 2022-07-19 | Discharge: 2022-07-19 | Disposition: A | Discharge status: Home / Self Care | Payer: Medicare PPO | Attending: Urology | Admitting: Urology

## 2022-07-19 ENCOUNTER — Other Ambulatory Visit: Payer: Self-pay

## 2022-07-19 DIAGNOSIS — K219 Gastro-esophageal reflux disease without esophagitis: Secondary | ICD-10-CM | POA: Diagnosis not present

## 2022-07-19 DIAGNOSIS — J449 Chronic obstructive pulmonary disease, unspecified: Secondary | ICD-10-CM | POA: Insufficient documentation

## 2022-07-19 DIAGNOSIS — N400 Enlarged prostate without lower urinary tract symptoms: Secondary | ICD-10-CM | POA: Diagnosis not present

## 2022-07-19 DIAGNOSIS — I358 Other nonrheumatic aortic valve disorders: Secondary | ICD-10-CM | POA: Diagnosis not present

## 2022-07-19 DIAGNOSIS — Z87891 Personal history of nicotine dependence: Secondary | ICD-10-CM

## 2022-07-19 DIAGNOSIS — R3912 Poor urinary stream: Secondary | ICD-10-CM | POA: Insufficient documentation

## 2022-07-19 DIAGNOSIS — M1990 Unspecified osteoarthritis, unspecified site: Secondary | ICD-10-CM | POA: Diagnosis not present

## 2022-07-19 DIAGNOSIS — Z01818 Encounter for other preprocedural examination: Secondary | ICD-10-CM

## 2022-07-19 DIAGNOSIS — I5032 Chronic diastolic (congestive) heart failure: Secondary | ICD-10-CM | POA: Insufficient documentation

## 2022-07-19 DIAGNOSIS — N138 Other obstructive and reflux uropathy: Secondary | ICD-10-CM

## 2022-07-19 DIAGNOSIS — I509 Heart failure, unspecified: Secondary | ICD-10-CM

## 2022-07-19 DIAGNOSIS — K449 Diaphragmatic hernia without obstruction or gangrene: Secondary | ICD-10-CM | POA: Insufficient documentation

## 2022-07-19 DIAGNOSIS — R519 Headache, unspecified: Secondary | ICD-10-CM | POA: Insufficient documentation

## 2022-07-19 DIAGNOSIS — N401 Enlarged prostate with lower urinary tract symptoms: Secondary | ICD-10-CM | POA: Diagnosis not present

## 2022-07-19 DIAGNOSIS — I2510 Atherosclerotic heart disease of native coronary artery without angina pectoris: Secondary | ICD-10-CM | POA: Insufficient documentation

## 2022-07-19 HISTORY — DX: Personal history of other (healed) physical injury and trauma: Z87.828

## 2022-07-19 HISTORY — DX: Other intervertebral disc degeneration, lumbosacral region without mention of lumbar back pain or lower extremity pain: M51.379

## 2022-07-19 HISTORY — DX: Diaphragmatic hernia without obstruction or gangrene: K44.9

## 2022-07-19 HISTORY — DX: Headache, unspecified: R51.9

## 2022-07-19 HISTORY — DX: Personal history of other specified conditions: Z87.898

## 2022-07-19 HISTORY — DX: Presence of external hearing-aid: Z97.4

## 2022-07-19 HISTORY — PX: THULIUM LASER TURP (TRANSURETHRAL RESECTION OF PROSTATE): SHX6744

## 2022-07-19 HISTORY — DX: Personal history of adenomatous and serrated colon polyps: Z86.0101

## 2022-07-19 HISTORY — DX: Irritable bowel syndrome with diarrhea: K58.0

## 2022-07-19 HISTORY — DX: Chronic obstructive pulmonary disease, unspecified: J44.9

## 2022-07-19 HISTORY — DX: Prediabetes: R73.03

## 2022-07-19 HISTORY — DX: Benign prostatic hyperplasia with lower urinary tract symptoms: N40.1

## 2022-07-19 HISTORY — DX: Personal history of other diseases of the nervous system and sense organs: Z86.69

## 2022-07-19 HISTORY — DX: Unspecified osteoarthritis, unspecified site: M19.90

## 2022-07-19 HISTORY — DX: Poor urinary stream: R39.12

## 2022-07-19 HISTORY — DX: Personal history of colonic polyps: Z86.010

## 2022-07-19 HISTORY — DX: Male erectile dysfunction, unspecified: N52.9

## 2022-07-19 HISTORY — DX: Mixed hyperlipidemia: E78.2

## 2022-07-19 HISTORY — DX: Personal history of other malignant neoplasm of skin: Z85.828

## 2022-07-19 HISTORY — DX: Chronic diastolic (congestive) heart failure: I50.32

## 2022-07-19 HISTORY — DX: Other intervertebral disc degeneration, lumbosacral region: M51.37

## 2022-07-19 HISTORY — DX: Other forms of dyspnea: R06.09

## 2022-07-19 HISTORY — DX: Other chronic pain: G89.29

## 2022-07-19 LAB — POCT I-STAT, CHEM 8
BUN: 19 mg/dL (ref 8–23)
Calcium, Ion: 1.24 mmol/L (ref 1.15–1.40)
Chloride: 107 mmol/L (ref 98–111)
Creatinine, Ser: 1 mg/dL (ref 0.61–1.24)
Glucose, Bld: 111 mg/dL — ABNORMAL HIGH (ref 70–99)
HCT: 47 % (ref 39.0–52.0)
Hemoglobin: 16 g/dL (ref 13.0–17.0)
Potassium: 4.1 mmol/L (ref 3.5–5.1)
Sodium: 144 mmol/L (ref 135–145)
TCO2: 23 mmol/L (ref 22–32)

## 2022-07-19 SURGERY — THULIUM LASER TURP (TRANSURETHRAL RESECTION OF PROSTATE)
Anesthesia: General | Site: Prostate

## 2022-07-19 MED ORDER — LIDOCAINE HCL (PF) 2 % IJ SOLN
INTRAMUSCULAR | Status: AC
  Filled 2022-07-19: qty 20

## 2022-07-19 MED ORDER — CEFAZOLIN SODIUM-DEXTROSE 2-4 GM/100ML-% IV SOLN
INTRAVENOUS | Status: AC
  Filled 2022-07-19: qty 100

## 2022-07-19 MED ORDER — ONDANSETRON HCL 4 MG/2ML IJ SOLN
INTRAMUSCULAR | Status: DC | PRN
  Administered 2022-07-19: 4 mg via INTRAVENOUS

## 2022-07-19 MED ORDER — CEPHALEXIN 500 MG PO CAPS: 500.0000 mg | ORAL_CAPSULE | Freq: Every day | ORAL | 0 refills | Status: DC

## 2022-07-19 MED ORDER — DEXAMETHASONE SODIUM PHOSPHATE 10 MG/ML IJ SOLN
INTRAMUSCULAR | Status: AC
  Filled 2022-07-19: qty 3

## 2022-07-19 MED ORDER — EPHEDRINE SULFATE (PRESSORS) 50 MG/ML IJ SOLN
INTRAMUSCULAR | Status: DC | PRN
  Administered 2022-07-19 (×2): 5 mg via INTRAVENOUS

## 2022-07-19 MED ORDER — ACETAMINOPHEN 10 MG/ML IV SOLN: 1000.0000 mg | Freq: Once | INTRAVENOUS | Status: DC | PRN

## 2022-07-19 MED ORDER — ONDANSETRON HCL 4 MG/2ML IJ SOLN: 4.0000 mg | Freq: Once | INTRAMUSCULAR | Status: DC | PRN

## 2022-07-19 MED ORDER — GLYCOPYRROLATE PF 0.2 MG/ML IJ SOSY
PREFILLED_SYRINGE | INTRAMUSCULAR | Status: AC
  Filled 2022-07-19: qty 1

## 2022-07-19 MED ORDER — PHENYLEPHRINE 80 MCG/ML (10ML) SYRINGE FOR IV PUSH (FOR BLOOD PRESSURE SUPPORT)
PREFILLED_SYRINGE | INTRAVENOUS | Status: AC
  Filled 2022-07-19: qty 30

## 2022-07-19 MED ORDER — FENTANYL CITRATE (PF) 100 MCG/2ML IJ SOLN
INTRAMUSCULAR | Status: AC
  Filled 2022-07-19: qty 2

## 2022-07-19 MED ORDER — LIDOCAINE 2% (20 MG/ML) 5 ML SYRINGE
INTRAMUSCULAR | Status: DC | PRN
  Administered 2022-07-19: 60 mg via INTRAVENOUS

## 2022-07-19 MED ORDER — PROPOFOL 10 MG/ML IV BOLUS
INTRAVENOUS | Status: DC | PRN
  Administered 2022-07-19: 20 mg via INTRAVENOUS
  Administered 2022-07-19: 130 mg via INTRAVENOUS

## 2022-07-19 MED ORDER — CEFAZOLIN SODIUM-DEXTROSE 2-4 GM/100ML-% IV SOLN
2.0000 g | INTRAVENOUS | Status: AC
  Administered 2022-07-19: 2 g via INTRAVENOUS

## 2022-07-19 MED ORDER — 0.9 % SODIUM CHLORIDE (POUR BTL) OPTIME
TOPICAL | Status: DC | PRN
  Administered 2022-07-19: 500 mL

## 2022-07-19 MED ORDER — ONDANSETRON HCL 4 MG/2ML IJ SOLN
INTRAMUSCULAR | Status: AC
  Filled 2022-07-19: qty 6

## 2022-07-19 MED ORDER — PHENYLEPHRINE 80 MCG/ML (10ML) SYRINGE FOR IV PUSH (FOR BLOOD PRESSURE SUPPORT)
PREFILLED_SYRINGE | INTRAVENOUS | Status: DC | PRN
  Administered 2022-07-19 (×5): 80 ug via INTRAVENOUS

## 2022-07-19 MED ORDER — FENTANYL CITRATE (PF) 100 MCG/2ML IJ SOLN: 25.0000 ug | INTRAMUSCULAR | Status: DC | PRN

## 2022-07-19 MED ORDER — STERILE WATER FOR IRRIGATION IR SOLN
Status: DC | PRN
  Administered 2022-07-19: 500 mL

## 2022-07-19 MED ORDER — FENTANYL CITRATE (PF) 250 MCG/5ML IJ SOLN
INTRAMUSCULAR | Status: DC | PRN
  Administered 2022-07-19 (×4): 25 ug via INTRAVENOUS

## 2022-07-19 MED ORDER — LACTATED RINGERS IV SOLN: INTRAVENOUS | Status: DC

## 2022-07-19 MED ORDER — PROPOFOL 10 MG/ML IV BOLUS
INTRAVENOUS | Status: AC
  Filled 2022-07-19: qty 20

## 2022-07-19 MED ORDER — EPHEDRINE 5 MG/ML INJ
INTRAVENOUS | Status: AC
  Filled 2022-07-19: qty 20

## 2022-07-19 MED ORDER — SODIUM CHLORIDE 0.9 % IR SOLN
Status: DC | PRN
  Administered 2022-07-19: 3000 mL
  Administered 2022-07-19: 6000 mL

## 2022-07-19 SURGICAL SUPPLY — 34 items
BAG DRAIN URO-CYSTO SKYTR STRL (DRAIN) ×1 IMPLANT
BAG DRN RND TRDRP ANRFLXCHMBR (UROLOGICAL SUPPLIES) ×1
BAG DRN UROCATH (DRAIN) ×1
BAG URINE DRAIN 2000ML AR STRL (UROLOGICAL SUPPLIES) ×1 IMPLANT
CATH COUDE FOLEY 2W 5CC 18FR (CATHETERS) IMPLANT
CATH FOLEY 2WAY SLVR  5CC 18FR (CATHETERS)
CATH FOLEY 2WAY SLVR  5CC 20FR (CATHETERS) ×1
CATH FOLEY 2WAY SLVR 5CC 18FR (CATHETERS) IMPLANT
CATH FOLEY 2WAY SLVR 5CC 20FR (CATHETERS) IMPLANT
CATH FOLEY 3WAY 30CC 22FR (CATHETERS) IMPLANT
CLOTH BEACON ORANGE TIMEOUT ST (SAFETY) ×1 IMPLANT
ELECT BIVAP BIPO 22/24 DONUT (ELECTROSURGICAL)
ELECTRD BIVAP BIPO 22/24 DONUT (ELECTROSURGICAL) IMPLANT
GLOVE BIO SURGEON STRL SZ7.5 (GLOVE) ×1 IMPLANT
GLOVE BIO SURGEON STRL SZ8 (GLOVE) IMPLANT
GOWN STRL REUS W/TWL LRG LVL3 (GOWN DISPOSABLE) ×1 IMPLANT
HOLDER FOLEY CATH W/STRAP (MISCELLANEOUS) IMPLANT
IV NS 1000ML (IV SOLUTION) ×1
IV NS 1000ML BAXH (IV SOLUTION) ×1 IMPLANT
IV NS IRRIG 3000ML ARTHROMATIC (IV SOLUTION) ×1 IMPLANT
IV SET EXTENSION GRAVITY 40 LF (IV SETS) ×1 IMPLANT
KIT TURNOVER CYSTO (KITS) ×1 IMPLANT
LASER REVOLIX HI ENERGY 1000 (Laser) ×1 IMPLANT
LASER REVOLIX PROCEDURE (MISCELLANEOUS) ×1 IMPLANT
LOOP CUT BIPOLAR 24F LRG (ELECTROSURGICAL) IMPLANT
MANIFOLD NEPTUNE II (INSTRUMENTS) IMPLANT
PACK CYSTO (CUSTOM PROCEDURE TRAY) ×1 IMPLANT
SOL PREP POV-IOD 4OZ 10% (MISCELLANEOUS) IMPLANT
SYR 30ML LL (SYRINGE) IMPLANT
SYR TOOMEY IRRIG 70ML (MISCELLANEOUS)
SYRINGE TOOMEY IRRIG 70ML (MISCELLANEOUS) IMPLANT
TUBE CONNECTING 12X1/4 (SUCTIONS) IMPLANT
TUBING UROLOGY SET (TUBING) IMPLANT
WATER STERILE IRR 500ML POUR (IV SOLUTION) IMPLANT

## 2022-07-19 NOTE — Anesthesia Procedure Notes (Signed)
Procedure Name: LMA Insertion Date/Time: 07/19/2022 2:34 PM  Performed by: Clearnce Sorrel, CRNAPre-anesthesia Checklist: Patient identified, Emergency Drugs available, Suction available and Patient being monitored Patient Re-evaluated:Patient Re-evaluated prior to induction Oxygen Delivery Method: Circle System Utilized Preoxygenation: Pre-oxygenation with 100% oxygen Induction Type: IV induction Ventilation: Mask ventilation without difficulty LMA: LMA inserted LMA Size: 4.0 Number of attempts: 1 Airway Equipment and Method: Bite block Placement Confirmation: positive ETCO2 Tube secured with: Tape Dental Injury: Teeth and Oropharynx as per pre-operative assessment
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
## 2022-07-19 NOTE — Discharge Instructions (Signed)

## 2022-07-19 NOTE — Op Note (Signed)
Preoperative diagnosis: BPH with lower urinary tract symptoms, weak stream Postoperative diagnosis: Same  Procedure: Thulium laser vaporization of prostate  Surgeon: Junious Silk  Anesthesia: General  Indication for procedure: Zubair is an 81 year old male with a history of BPH and lower urinary tract symptoms we considered a variety of treatment options both medical and surgical and he elected for laser vaporization.  Findings: Cystoscopy was revealed trilobar bladder outlet obstruction from the prostate.  There was a high bladder neck.  Trigone and ureteral orifice ease were normal.  No stone or foreign body in the bladder.  No mucosal lesion.  The bladder was heavily trabeculated with cellules and small diverticula up near the dome.  Description of procedure: After consent was obtained patient brought to the operating room.  After adequate anesthesia he was placed in lithotomy position and prepped and draped in the usual sterile fashion.  Timeout was performed to confirm the patient and procedure.  Cystoscope was passed per urethra and the bladder inspected.  I was using the continuous-flow laser sheath.  The 1000 m and fiber was passed in the ureteral orifice ease were marked.  I then made my typical incisions at the 5 and 7:00 through the prostate and bladder neck and brought that down to the verumontanum.  Then the median lobe was vaporized.  Then from anterior to posterior bladder neck to verumontanum the lateral on the left and the lateral right lobe was vaporized.  There was some bleeding vessel at the right apical just superior and lateral to the verumontanum.  I revaporized this area lightly once or twice but could not achieve hemostasis.  Therefore I passed the continuous-flow sheath with the visual obturator and then swapped that out for the loop and handle.  I used the loop to fulgurate some apical capsular vessels on the right.  This provided excellent hemostasis at low pressure.  I lightly  fulgurated some bleeding around the prostatic urethra and bladder neck.  Again ureteral orifice ease were noted to be normal and excellent hemostasis at low pressure.  A photo was taken.  Bladder was filled with some fluid and the scope removed.  An 38 [place] two-way catheter was placed with 17 cc in the balloon.  It was left to gravity drainage once seated at the bladder neck.  Irrigation was clear.  He was then awakened taken to the cover room in stable condition.  Complications: None  Blood loss: Minimal  Specimens: None  Drains: 20 French Foley catheter  Disposition: Patient stable to PACU

## 2022-07-19 NOTE — Transfer of Care (Signed)
Immediate Anesthesia Transfer of Care Note  Patient: Benjamin [name]  Procedure(s) Performed: Marcelino Duster LASER TURP (TRANSURETHRAL RESECTION OF PROSTATE) (Prostate)  Patient Location: PACU  Anesthesia Type:General  Level of Consciousness: drowsy  Airway & Oxygen Therapy: Patient Spontanous Breathing and Patient connected to face mask oxygen  Post-op Assessment: Report given to RN and Post -op Vital signs reviewed and stable  Post vital signs: Reviewed and stable  Last Vitals:  Vitals Value Taken Time  BP 115/66 07/19/22 1538  Temp    Pulse 67 07/19/22 1541  Resp 15 07/19/22 1541  SpO2 94 % 07/19/22 1541  Vitals shown include unvalidated device data.  Last Pain:  Vitals:   07/19/22 1159  TempSrc: Temporal  PainSc: 8          Complications: No notable events documented.

## 2022-07-20 ENCOUNTER — Other Ambulatory Visit: Payer: Self-pay

## 2022-07-20 ENCOUNTER — Encounter (HOSPITAL_COMMUNITY): Payer: Self-pay | Admitting: *Deleted

## 2022-07-20 ENCOUNTER — Emergency Department (HOSPITAL_COMMUNITY)
Admission: EM | Admit: 2022-07-20 | Discharge: 2022-07-20 | Disposition: A | Discharge status: Home / Self Care | Payer: Medicare PPO | Attending: Emergency Medicine | Admitting: Emergency Medicine

## 2022-07-20 DIAGNOSIS — Z85828 Personal history of other malignant neoplasm of skin: Secondary | ICD-10-CM | POA: Diagnosis not present

## 2022-07-20 DIAGNOSIS — J449 Chronic obstructive pulmonary disease, unspecified: Secondary | ICD-10-CM | POA: Diagnosis not present

## 2022-07-20 DIAGNOSIS — Z951 Presence of aortocoronary bypass graft: Secondary | ICD-10-CM | POA: Diagnosis not present

## 2022-07-20 DIAGNOSIS — T839XXA Unspecified complication of genitourinary prosthetic device, implant and graft, initial encounter: Secondary | ICD-10-CM

## 2022-07-20 DIAGNOSIS — N3001 Acute cystitis with hematuria: Secondary | ICD-10-CM | POA: Diagnosis not present

## 2022-07-20 DIAGNOSIS — I2510 Atherosclerotic heart disease of native coronary artery without angina pectoris: Secondary | ICD-10-CM | POA: Diagnosis not present

## 2022-07-20 DIAGNOSIS — T83091A Other mechanical complication of indwelling urethral catheter, initial encounter: Secondary | ICD-10-CM | POA: Diagnosis not present

## 2022-07-20 DIAGNOSIS — I509 Heart failure, unspecified: Secondary | ICD-10-CM | POA: Insufficient documentation

## 2022-07-20 DIAGNOSIS — T83098A Other mechanical complication of other indwelling urethral catheter, initial encounter: Secondary | ICD-10-CM | POA: Diagnosis not present

## 2022-07-20 LAB — URINALYSIS, ROUTINE W REFLEX MICROSCOPIC
Bilirubin Urine: NEGATIVE
Glucose, UA: NEGATIVE mg/dL
Ketones, ur: NEGATIVE mg/dL
Nitrite: NEGATIVE
Protein, ur: 100 mg/dL — AB
RBC / HPF: >50 RBC/hpf — ABNORMAL HIGH (ref 0–5)
Specific Gravity, Urine: 1.017 (ref 1.005–1.030)
pH: 5 (ref 5.0–8.0)

## 2022-07-20 LAB — CBC WITH DIFFERENTIAL/PLATELET
Abs Immature Granulocytes: 0.02 10*3/uL (ref 0.00–0.07)
Basophils Absolute: 0.1 10*3/uL (ref 0.0–0.1)
Basophils Relative: 1 %
Eosinophils Absolute: 0.3 10*3/uL (ref 0.0–0.5)
Eosinophils Relative: 5 %
HCT: 45.1 % (ref 39.0–52.0)
Hemoglobin: 14.9 g/dL (ref 13.0–17.0)
Immature Granulocytes: 0 %
Lymphocytes Relative: 16 %
Lymphs Abs: 1.1 10*3/uL (ref 0.7–4.0)
MCH: 31 pg (ref 26.0–34.0)
MCHC: 33 g/dL (ref 30.0–36.0)
MCV: 94 fL (ref 80.0–100.0)
Monocytes Absolute: 0.6 10*3/uL (ref 0.1–1.0)
Monocytes Relative: 8 %
Neutro Abs: 4.9 10*3/uL (ref 1.7–7.7)
Neutrophils Relative %: 70 %
Platelets: 227 10*3/uL (ref 150–400)
RBC: 4.8 MIL/uL (ref 4.22–5.81)
RDW: 14.4 % (ref 11.5–15.5)
WBC: 7 10*3/uL (ref 4.0–10.5)
nRBC: 0 % (ref 0.0–0.2)

## 2022-07-20 LAB — BASIC METABOLIC PANEL
Anion gap: 7 (ref 5–15)
BUN: 16 mg/dL (ref 8–23)
CO2: 26 mmol/L (ref 22–32)
Calcium: 8.7 mg/dL — ABNORMAL LOW (ref 8.9–10.3)
Chloride: 110 mmol/L (ref 98–111)
Creatinine, Ser: 1.16 mg/dL (ref 0.61–1.24)
GFR, Estimated: >60 mL/min (ref >60)
Glucose, Bld: 111 mg/dL — ABNORMAL HIGH (ref 70–99)
Potassium: 4.4 mmol/L (ref 3.5–5.1)
Sodium: 143 mmol/L (ref 135–145)

## 2022-07-20 MED ORDER — HYDROCODONE-ACETAMINOPHEN 5-325 MG PO TABS: 1.0000 | ORAL_TABLET | ORAL | 0 refills | Status: DC | PRN

## 2022-07-20 MED ORDER — ONDANSETRON HCL 4 MG/2ML IJ SOLN
4.0000 mg | Freq: Once | INTRAMUSCULAR | Status: AC
  Administered 2022-07-20: 4 mg via INTRAVENOUS
  Filled 2022-07-20: qty 2

## 2022-07-20 MED ORDER — PHENAZOPYRIDINE HCL 200 MG PO TABS: 200.0000 mg | ORAL_TABLET | Freq: Three times a day (TID) | ORAL | 0 refills | Status: DC

## 2022-07-20 MED ORDER — PHENAZOPYRIDINE HCL 100 MG PO TABS
200.0000 mg | ORAL_TABLET | Freq: Once | ORAL | Status: AC
  Administered 2022-07-20: 200 mg via ORAL
  Filled 2022-07-20: qty 2

## 2022-07-20 MED ORDER — CEPHALEXIN 500 MG PO CAPS
500.0000 mg | ORAL_CAPSULE | Freq: Once | ORAL | Status: AC
  Administered 2022-07-20: 500 mg via ORAL
  Filled 2022-07-20: qty 1

## 2022-07-20 MED ORDER — MORPHINE SULFATE (PF) 4 MG/ML IV SOLN
4.0000 mg | Freq: Once | INTRAVENOUS | Status: AC
  Administered 2022-07-20: 4 mg via INTRAVENOUS
  Filled 2022-07-20: qty 1

## 2022-07-20 MED ORDER — LIDOCAINE HCL URETHRAL/MUCOSAL 2 % EX GEL: 1.0000 | Freq: Once | CUTANEOUS | Status: DC

## 2022-07-20 MED ORDER — FENTANYL CITRATE PF 50 MCG/ML IJ SOSY
50.0000 ug | PREFILLED_SYRINGE | Freq: Once | INTRAMUSCULAR | Status: AC
  Administered 2022-07-20: 50 ug via INTRAVENOUS
  Filled 2022-07-20: qty 1

## 2022-07-20 NOTE — ED Provider Notes (Signed)
[facility] EMERGENCY DEPARTMENT Provider Note   CSN: [id number] Arrival date & time: 07/20/22  6213     History  Chief Complaint  Patient presents with   Urinary Catheter Problem    Benjamin [name] is a 81 y.o. male.  Pt is a 81 yo male with a pmhx significant for BPH, COPD, GERD, CAD s/p CABG, skin cancer, OA, CHF, and HLD.  Pt had surgery (thulium laser vaporization of prostate) yesterday by Dr. Junious Silk.  A 20 F foley placed during the procedure.  Pt said he's had a lot of bleeding and pain since the surgery.  He is urinating around the catheter as well. No fever.  Pt did get a rx for Keflex, but has not started it yet b/c the wife has not had a chance to go get it.       Home Medications Prior to Admission medications   Medication Sig Start Date End Date Taking? Authorizing Provider  HYDROcodone-acetaminophen (NORCO/VICODIN) 5-325 MG tablet Take 1 tablet by mouth every 4 (four) hours as needed. 07/20/22  Yes Isla Pence, MD  phenazopyridine (PYRIDIUM) 200 MG tablet Take 1 tablet (200 mg total) by mouth 3 (three) times daily. 07/20/22  Yes Isla Pence, MD  acetaminophen (TYLENOL) 500 MG tablet Take 1,000 mg by mouth every 8 (eight) hours as needed for mild pain.    [provider]  albuterol (VENTOLIN HFA) 108 (90 Base) MCG/ACT inhaler Inhale 1 puff into the lungs every 6 (six) hours as needed for wheezing or shortness of breath. Patient taking differently: Inhale 1 puff into the lungs every 6 (six) hours as needed for wheezing or shortness of breath. 06/25/22   Lindell Spar, MD  cephALEXin (KEFLEX) 500 MG capsule Take 1 capsule (500 mg total) by mouth at bedtime. 07/19/22   Festus Aloe, MD  colestipol (COLESTID) 1 g tablet Take 2 g by mouth 2 (two) times daily. 06/30/20   [provider]  Cranberry 400 MG TABS Take 4 tablets by mouth 2 (two) times daily.    [provider]  Evolocumab (REPATHA SURECLICK [place]) every 14 (fourteen) days. Inject  125 mg into the skin  twice a month on the 1st and the 15th    [provider]  Fexofenadine-Pseudoephedrine (ALLEGRA-D 24 HOUR PO) Take by mouth as needed.    [provider]  finasteride (PROSCAR) 5 MG tablet Take 1 tablet (5 mg total) by mouth daily. Patient taking differently: Take 5 mg by mouth daily. 05/22/22   Lindell Spar, MD  metoprolol tartrate (LOPRESSOR) 25 MG tablet Take 1/2 (one-half) tablet by mouth twice daily Patient taking differently: Take 12.5 mg by mouth 2 (two) times daily. 05/14/22   Satira Sark, MD  pantoprazole (PROTONIX) 40 MG tablet Take 1 tablet (40 mg total) by mouth 2 (two) times daily. Patient taking differently: Take 40 mg by mouth 2 (two) times daily. 05/22/22   Lindell Spar, MD  sildenafil (REVATIO) 20 MG tablet Take 1 tablet (20 mg total) by mouth daily. Take 1-5 tablets as needed prior to sexual activity Patient not taking: Reported on 07/19/2022 06/03/22   Festus Aloe, MD  tamsulosin (FLOMAX) 0.4 MG CAPS capsule TAKE 2 CAPSULES BY MOUTH ONCE DAILY AFTER SUPPER FOR PROSTATE Patient taking differently: Take 0.8 mg by mouth daily. TAKE 2 CAPSULES BY MOUTH ONCE DAILY 11/12/21   Lindell Spar, MD      Allergies    Arsenic, Contrast media [iodinated contrast media], and Statins  Review of Systems   Review of Systems  Genitourinary:  Positive for dysuria.  All other systems reviewed and are negative.   Physical Exam Updated Vital Signs BP 123/69   Pulse 69   Temp 98.1 F (36.7 C) (Axillary)   Resp 18   Ht 5' 11.5" (1.816 m)   Wt 89.2 kg   SpO2 99%   BMI 27.05 kg/m  Physical Exam Vitals and nursing note reviewed.  Constitutional:      Appearance: Normal appearance.  HENT:     Head: Normocephalic and atraumatic.     Right Ear: External ear normal.     Left Ear: External ear normal.     Nose: Nose normal.     Mouth/Throat:     Mouth: Mucous membranes are moist.     Pharynx: Oropharynx is clear.  Eyes:      Extraocular Movements: Extraocular movements intact.     Conjunctiva/sclera: Conjunctivae normal.     Pupils: Pupils are equal, round, and reactive to light.  Cardiovascular:     Rate and Rhythm: Normal rate and regular rhythm.     Pulses: Normal pulses.     Heart sounds: Normal heart sounds.  Pulmonary:     Effort: Pulmonary effort is normal.     Breath sounds: Normal breath sounds.  Abdominal:     General: Abdomen is flat. Bowel sounds are normal.     Palpations: Abdomen is soft.  Musculoskeletal:        General: Normal range of motion.     Cervical back: Normal range of motion and neck supple.  Skin:    General: Skin is warm.     Capillary Refill: Capillary refill takes less than 2 seconds.  Neurological:     General: No focal deficit present.     Mental Status: He is alert and oriented to person, place, and time.  Psychiatric:        Mood and Affect: Mood normal.        Behavior: Behavior normal.     ED Results / Procedures / Treatments   Labs (all labs ordered are listed, but only abnormal results are displayed) Labs Reviewed  BASIC METABOLIC PANEL - Abnormal; Notable for the following components:      Result Value   Glucose, Bld 111 (*)    Calcium 8.7 (*)    All other components within normal limits  URINALYSIS, ROUTINE W REFLEX MICROSCOPIC - Abnormal; Notable for the following components:   Color, Urine AMBER (*)    APPearance HAZY (*)    Hgb urine dipstick LARGE (*)    Protein, ur 100 (*)    Leukocytes,Ua MODERATE (*)    RBC / HPF >50 (*)    Bacteria, UA FEW (*)    All other components within normal limits  URINE CULTURE  CBC WITH DIFFERENTIAL/PLATELET    EKG None  Radiology No results found.  Procedures Procedures    Medications Ordered in ED Medications  lidocaine (XYLOCAINE) 2 % jelly 1 Application (1 Application Urethral Not Given 07/20/22 1310)  cephALEXin (KEFLEX) capsule 500 mg (has no administration in time range)  morphine (PF) 4 MG/ML  injection 4 mg (4 mg Intravenous Given 07/20/22 1136)  ondansetron (ZOFRAN) injection 4 mg (4 mg Intravenous Given 07/20/22 1133)  phenazopyridine (PYRIDIUM) tablet 200 mg (200 mg Oral Given 07/20/22 1137)  fentaNYL (SUBLIMAZE) injection 50 mcg (50 mcg Intravenous Given 07/20/22 1351)    ED Course/ Medical Decision Making/ A&P  Medical Decision Making Amount and/or Complexity of Data Reviewed Labs: ordered.  Risk Prescription drug management.   This patient presents to the ED for concern of catheter dysfunction, this involves an extensive number of treatment options, and is a complaint that carries with it a high risk of complications and morbidity.  The differential diagnosis includes uti, blood clot   Co morbidities that complicate the patient evaluation  BPH, COPD, GERD, CAD s/p CABG, skin cancer, OA, CHF, and HLD   Additional history obtained:  Additional history obtained from epic chart review External records from outside source obtained and reviewed including wife   Lab Tests:  I Ordered, and personally interpreted labs.  The pertinent results include:  cbc nl, bmp nl, ua + hgb, mod LE, >50 rbcs and 21-50 wbcs   Cardiac Monitoring:  The patient was maintained on a cardiac monitor.  I personally viewed and interpreted the cardiac monitored which showed an underlying rhythm of: nsr   Medicines ordered and prescription drug management:  I ordered medication including morphine, fentanyl  for pain  Reevaluation of the patient after these medicines showed that the patient improved I have reviewed the patients home medicines and have made adjustments as needed    Critical Interventions:  foley   Consultations Obtained:  I requested consultation with the urology (Dr. Louis Meckel),  and discussed lab and imaging findings as well as pertinent plan - he said it's ok to change the catheter   Problem List / ED Course:  Urinary retention:  is  suspect pt has a blood clot in his catheter.  Pt's nurse tried to irrigate, but was unsuccessful.  Pt d/w Dr. Louis Meckel regarding changing the catheter.  He said it was ok to change.  We placed a 3 way in case we needed to do bladder irrigation.  However, after the change, urine is draining well and is not bloody.  Pt feels much better.  Pt is to take the keflex already prescribed.  I have added on pyridium and lortab.  Pt is stable for d/c.  Return if worse.  F/u with urology.   Reevaluation:  After the interventions noted above, I reevaluated the patient and found that they have :improved   Social Determinants of Health:  Lives at home with wife   Dispostion:  After consideration of the diagnostic results and the patients response to treatment, I feel that the patent would benefit from discharge with outpatient f/u.          Final Clinical Impression(s) / ED Diagnoses Final diagnoses:  Urinary catheter complication, initial encounter ([place])  Acute cystitis with hematuria    Rx / DC Orders ED Discharge Orders          Ordered    phenazopyridine (PYRIDIUM) 200 MG tablet  3 times daily        07/20/22 1433    HYDROcodone-acetaminophen (NORCO/VICODIN) 5-325 MG tablet  Every 4 hours PRN        07/20/22 1433              Isla Pence, MD 07/20/22 1438

## 2022-07-20 NOTE — ED Notes (Signed)
Patient noted to have O2 sats of 86-88% following fentanyl administration. Patient placed on 2L [place]. Patients O2 sats now 92%. Per EDP O2 sats will need to be over 90% on RA in order to discharge.

## 2022-07-20 NOTE — ED Triage Notes (Addendum)
Pt comes in from home with c/o urine leaking from around the catheter site and pain from catheter. Pt reports he had prostate surgery done yesterday. Urine in catheter bag is dark red in color.

## 2022-07-21 DIAGNOSIS — Z951 Presence of aortocoronary bypass graft: Secondary | ICD-10-CM | POA: Diagnosis not present

## 2022-07-21 DIAGNOSIS — I509 Heart failure, unspecified: Secondary | ICD-10-CM | POA: Diagnosis not present

## 2022-07-21 DIAGNOSIS — T83091A Other mechanical complication of indwelling urethral catheter, initial encounter: Secondary | ICD-10-CM | POA: Diagnosis not present

## 2022-07-21 DIAGNOSIS — T83098D Other mechanical complication of other indwelling urethral catheter, subsequent encounter: Secondary | ICD-10-CM | POA: Diagnosis not present

## 2022-07-21 DIAGNOSIS — I2510 Atherosclerotic heart disease of native coronary artery without angina pectoris: Secondary | ICD-10-CM | POA: Insufficient documentation

## 2022-07-21 DIAGNOSIS — J449 Chronic obstructive pulmonary disease, unspecified: Secondary | ICD-10-CM | POA: Insufficient documentation

## 2022-07-21 DIAGNOSIS — Y828 Other medical devices associated with adverse incidents: Secondary | ICD-10-CM | POA: Insufficient documentation

## 2022-07-21 DIAGNOSIS — Z7951 Long term (current) use of inhaled steroids: Secondary | ICD-10-CM | POA: Insufficient documentation

## 2022-07-22 ENCOUNTER — Other Ambulatory Visit: Payer: Self-pay

## 2022-07-22 ENCOUNTER — Emergency Department (HOSPITAL_COMMUNITY)
Admission: EM | Admit: 2022-07-22 | Discharge: 2022-07-22 | Disposition: A | Discharge status: Home / Self Care | Payer: Medicare PPO | Attending: Emergency Medicine | Admitting: Emergency Medicine

## 2022-07-22 ENCOUNTER — Telehealth: Payer: Self-pay

## 2022-07-22 ENCOUNTER — Encounter (HOSPITAL_COMMUNITY): Payer: Self-pay | Admitting: Emergency Medicine

## 2022-07-22 DIAGNOSIS — T839XXD Unspecified complication of genitourinary prosthetic device, implant and graft, subsequent encounter: Secondary | ICD-10-CM

## 2022-07-22 LAB — URINE CULTURE: Culture: NO GROWTH

## 2022-07-22 MED ORDER — PHENAZOPYRIDINE HCL 200 MG PO TABS: 200.0000 mg | ORAL_TABLET | Freq: Three times a day (TID) | ORAL | 0 refills | Status: DC | PRN

## 2022-07-22 NOTE — Telephone Encounter (Signed)
Patient has been to the ER twice since his surgery.  Patient catheter was not draining due to blood clots.  Hospital increased cath from a 5F to 79F cath.  Catheter is draining well now, he was given supplies to irrigate at home.  He wanted MD to be aware.

## 2022-07-22 NOTE — ED Notes (Signed)
Per verbal request by EDP, this RN irrigated Foley Cath with 1L sterile water. Minor clots noted, but Foley Cath functioning as desired. Urine return is clear. Pt in NAD.

## 2022-07-22 NOTE — Discharge Instructions (Signed)
Continue Pyridium as prescribed as needed.  Follow-up with your urologist as scheduled, and return to the ER if you develop any new and/or concerning symptoms.

## 2022-07-22 NOTE — ED Provider Notes (Signed)
[facility] EMERGENCY DEPARTMENT Provider Note   CSN: [id number] Arrival date & time: 07/21/22  2254     History  Chief Complaint  Patient presents with   Foley Catheter Problem    Benjamin [name] is a 81 y.o. male.  Patient is an 81 year old male with past medical history of COPD, BPH, CHF, coronary artery disease status post CABG, hyperlipidemia.  Patient is 3 days status post TURP for BPH.  Patient has an indwelling Foley catheter post operatively.  He was seen yesterday for urinary retention.  The existing catheter was removed and a larger catheter inserted.  He returns this evening stating that he is having burning in his urethra and continued discomfort.  He has had urine in the catheter bag that has been emptied several times throughout the day, however has not emptied the bag since 9:00 this evening.  He denies any fevers or chills.  He denies passing clots.  The history is provided by the patient.       Home Medications Prior to Admission medications   Medication Sig Start Date End Date Taking? Authorizing Provider  acetaminophen (TYLENOL) 500 MG tablet Take 1,000 mg by mouth every 8 (eight) hours as needed for mild pain.    [provider]  albuterol (VENTOLIN HFA) 108 (90 Base) MCG/ACT inhaler Inhale 1 puff into the lungs every 6 (six) hours as needed for wheezing or shortness of breath. Patient taking differently: Inhale 1 puff into the lungs every 6 (six) hours as needed for wheezing or shortness of breath. 06/25/22   Lindell Spar, MD  cephALEXin (KEFLEX) 500 MG capsule Take 1 capsule (500 mg total) by mouth at bedtime. 07/19/22   Festus Aloe, MD  colestipol (COLESTID) 1 g tablet Take 2 g by mouth 2 (two) times daily. 06/30/20   [provider]  Cranberry 400 MG TABS Take 4 tablets by mouth 2 (two) times daily.    [provider]  Evolocumab (REPATHA SURECLICK [place]) every 14 (fourteen) days. Inject 125 mg into the skin  twice a month on the  1st and the 15th    [provider]  Fexofenadine-Pseudoephedrine (ALLEGRA-D 24 HOUR PO) Take by mouth as needed.    [provider]  finasteride (PROSCAR) 5 MG tablet Take 1 tablet (5 mg total) by mouth daily. Patient taking differently: Take 5 mg by mouth daily. 05/22/22   Lindell Spar, MD  HYDROcodone-acetaminophen (NORCO/VICODIN) 5-325 MG tablet Take 1 tablet by mouth every 4 (four) hours as needed. 07/20/22   Isla Pence, MD  metoprolol tartrate (LOPRESSOR) 25 MG tablet Take 1/2 (one-half) tablet by mouth twice daily Patient taking differently: Take 12.5 mg by mouth 2 (two) times daily. 05/14/22   Satira Sark, MD  pantoprazole (PROTONIX) 40 MG tablet Take 1 tablet (40 mg total) by mouth 2 (two) times daily. Patient taking differently: Take 40 mg by mouth 2 (two) times daily. 05/22/22   Lindell Spar, MD  phenazopyridine (PYRIDIUM) 200 MG tablet Take 1 tablet (200 mg total) by mouth 3 (three) times daily. 07/20/22   Isla Pence, MD  sildenafil (REVATIO) 20 MG tablet Take 1 tablet (20 mg total) by mouth daily. Take 1-5 tablets as needed prior to sexual activity Patient not taking: Reported on 07/19/2022 06/03/22   Festus Aloe, MD  tamsulosin (FLOMAX) 0.4 MG CAPS capsule TAKE 2 CAPSULES BY MOUTH ONCE DAILY AFTER SUPPER FOR PROSTATE Patient taking differently: Take 0.8 mg by mouth daily. TAKE 2 CAPSULES BY  MOUTH ONCE DAILY 11/12/21   Lindell Spar, MD      Allergies    Arsenic, Contrast media [iodinated contrast media], and Statins    Review of Systems   Review of Systems  All other systems reviewed and are negative.   Physical Exam Updated Vital Signs BP 108/71 (BP Location: Left Arm)   Pulse 85   Temp 98.8 F (37.1 C) (Axillary)   Resp 20   Ht '5\' 11"'$  (1.803 m)   Wt 89 kg   SpO2 (!) 85%   BMI 27.37 kg/m  Physical Exam Vitals and nursing note reviewed.  Constitutional:      General: He is not in acute distress.    Appearance: Normal  appearance. He is not ill-appearing.  HENT:     Head: Normocephalic and atraumatic.  Pulmonary:     Effort: Pulmonary effort is normal.  Abdominal:     General: There is no distension.     Tenderness: There is no abdominal tenderness.  Genitourinary:    Penis: Normal.   Skin:    General: Skin is warm and dry.  Neurological:     Mental Status: He is alert.     ED Results / Procedures / Treatments   Labs (all labs ordered are listed, but only abnormal results are displayed) Labs Reviewed - No data to display  EKG None  Radiology No results found.  Procedures Procedures    Medications Ordered in ED Medications - No data to display  ED Course/ Medical Decision Making/ A&P  Patient with indwelling Foley catheter status post TURP presenting with complaints of burning in his urethra.  He is concerned the Foley may not be draining.  On bladder scan, there is no urinary retention.  The catheter was irrigated and several tiny clots were expressed, but no frank blood and the catheter appears to be flowing properly.  Patient to be discharged with follow-up with his urologist.  He is requesting a refill of his Pyridium which will be given.  Final Clinical Impression(s) / ED Diagnoses Final diagnoses:  None    Rx / DC Orders ED Discharge Orders     None         Veryl Speak, MD 07/22/22 [phone]

## 2022-07-22 NOTE — Anesthesia Postprocedure Evaluation (Signed)
Anesthesia Post Note  Patient: Benjamin [name]  Procedure(s) Performed: THULIUM LASER TURP (TRANSURETHRAL RESECTION OF PROSTATE) (Prostate)     Patient location during evaluation: PACU Anesthesia Type: General Level of consciousness: awake and alert Pain management: pain level controlled Vital Signs Assessment: post-procedure vital signs reviewed and stable Respiratory status: spontaneous breathing, nonlabored ventilation, respiratory function stable and patient connected to nasal cannula oxygen Cardiovascular status: blood pressure returned to baseline and stable Postop Assessment: no apparent nausea or vomiting Anesthetic complications: no   No notable events documented.  Last Vitals:  Vitals:   07/19/22 1630 07/19/22 1726  BP: 117/71 115/65  Pulse: (!) 58 63  Resp: 12 16  Temp:  (!) 36.1 C  SpO2: 90% 96%    Last Pain:  Vitals:   07/19/22 1630  TempSrc:   PainSc: 0-No pain   Pain Goal: Patients Stated Pain Goal: 4 (07/19/22 1630)                 Audry Pili

## 2022-07-22 NOTE — ED Triage Notes (Signed)
Pt states his foley catheter is not draining. States he was seen here yesterday for same.

## 2022-07-24 ENCOUNTER — Ambulatory Visit (INDEPENDENT_AMBULATORY_CARE_PROVIDER_SITE_OTHER): Payer: Medicare PPO | Admitting: Physician Assistant

## 2022-07-24 VITALS — BP 102/70 | HR 84

## 2022-07-24 DIAGNOSIS — N401 Enlarged prostate with lower urinary tract symptoms: Secondary | ICD-10-CM | POA: Diagnosis not present

## 2022-07-24 DIAGNOSIS — Z978 Presence of other specified devices: Secondary | ICD-10-CM

## 2022-07-24 DIAGNOSIS — N3289 Other specified disorders of bladder: Secondary | ICD-10-CM

## 2022-07-24 MED ORDER — MIRABEGRON ER 25 MG PO TB24: 25.0000 mg | ORAL_TABLET | Freq: Every day | ORAL | 0 refills | Status: DC
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
## 2022-07-24 NOTE — Progress Notes (Signed)
Fill and Pull Catheter Removal  Patient is present today for a catheter removal.  Patient was cleaned and prepped in a sterile fashion 134m of sterile water/ saline was instilled into the bladder when the patient felt the urge to urinate. 112mof water was then drained from the balloon.  A 22 FR foley 3 way cath was removed from the bladder no complications were noted .  Patient as then given some time to void on their own.  Patient cannot void  44m15mn their own after some time.  Patient tolerated well.  Performed by: ShaMarisue BrooklynMA  Follow up/ Additional notes: Return @ 2pm or 2:30pm for PVR   post void residual =75

## 2022-07-24 NOTE — Patient Instructions (Signed)
Continue finasteride and tamsulosin until folllow up with Dr. Junious Silk

## 2022-07-29 ENCOUNTER — Ambulatory Visit (INDEPENDENT_AMBULATORY_CARE_PROVIDER_SITE_OTHER): Payer: Medicare PPO | Admitting: Physician Assistant

## 2022-07-29 VITALS — BP 97/66 | HR 77 | Wt 192.2 lb

## 2022-07-29 DIAGNOSIS — R300 Dysuria: Secondary | ICD-10-CM | POA: Diagnosis not present

## 2022-07-29 DIAGNOSIS — N4889 Other specified disorders of penis: Secondary | ICD-10-CM

## 2022-07-29 DIAGNOSIS — N401 Enlarged prostate with lower urinary tract symptoms: Secondary | ICD-10-CM

## 2022-07-29 DIAGNOSIS — R8281 Pyuria: Secondary | ICD-10-CM

## 2022-07-29 LAB — BLADDER SCAN AMB NON-IMAGING: Scan Result: 57

## 2022-07-29 MED ORDER — URIBEL 118 MG PO CAPS: 118.0000 mg | ORAL_CAPSULE | Freq: Four times a day (QID) | ORAL | 0 refills | Status: AC | PRN

## 2022-07-29 MED ORDER — SULFAMETHOXAZOLE-TRIMETHOPRIM 800-160 MG PO TABS: 1.0000 | ORAL_TABLET | Freq: Two times a day (BID) | ORAL | 0 refills | Status: DC

## 2022-07-29 NOTE — Progress Notes (Signed)
post void residual =57
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
## 2022-07-30 LAB — MICROSCOPIC EXAMINATION: RBC, Urine: >30 /hpf — AB (ref 0–2)

## 2022-07-30 LAB — URINALYSIS, ROUTINE W REFLEX MICROSCOPIC
Bilirubin, UA: NEGATIVE
Glucose, UA: NEGATIVE
Ketones, UA: NEGATIVE
Nitrite, UA: NEGATIVE
Specific Gravity, UA: 1.025 (ref 1.005–1.030)
Urobilinogen, Ur: 0.2 mg/dL (ref 0.2–1.0)
pH, UA: 5 (ref 5.0–7.5)

## 2022-08-02 ENCOUNTER — Other Ambulatory Visit: Payer: Self-pay | Admitting: Physician Assistant

## 2022-08-02 ENCOUNTER — Telehealth: Payer: Self-pay

## 2022-08-02 LAB — URINE CULTURE

## 2022-08-02 MED ORDER — NITROFURANTOIN MONOHYD MACRO 100 MG PO CAPS: 100.0000 mg | ORAL_CAPSULE | Freq: Two times a day (BID) | ORAL | 0 refills | Status: AC

## 2022-08-02 NOTE — Telephone Encounter (Signed)
-----   Message from Reynaldo Minium, [place] sent at 08/02/2022  9:51 AM EST ----- Please let the patient know that his recent urine culture came back positive and indicates resistance to the Bactrim which he was prescribed.  I will send in a new prescription for him.  Keep follow-up with Dr. Junious Silk as scheduled. ----- Message ----- From: Lavone Neri Lab Results In Sent: 07/30/2022   5:37 AM EST To: Reynaldo Minium, PA-C

## 2022-08-02 NOTE — Telephone Encounter (Signed)
Made patient aware that his recent culture came back positive and indicates resistance to the Bactrim which he was prescribed.  I will send in a new prescription for him. Keep follow-up with Dr. Junious Silk as scheduled. Patient voiced understanding.

## 2022-08-06 ENCOUNTER — Other Ambulatory Visit: Payer: Self-pay

## 2022-08-06 ENCOUNTER — Encounter (HOSPITAL_COMMUNITY): Payer: Self-pay

## 2022-08-06 ENCOUNTER — Emergency Department (HOSPITAL_COMMUNITY): Payer: Medicare PPO

## 2022-08-06 ENCOUNTER — Emergency Department (HOSPITAL_COMMUNITY)
Admission: EM | Admit: 2022-08-06 | Discharge: 2022-08-07 | Disposition: A | Discharge status: Home / Self Care | Payer: Medicare PPO | Attending: Emergency Medicine | Admitting: Emergency Medicine

## 2022-08-06 DIAGNOSIS — R079 Chest pain, unspecified: Secondary | ICD-10-CM | POA: Diagnosis not present

## 2022-08-06 DIAGNOSIS — J449 Chronic obstructive pulmonary disease, unspecified: Secondary | ICD-10-CM | POA: Insufficient documentation

## 2022-08-06 DIAGNOSIS — Z951 Presence of aortocoronary bypass graft: Secondary | ICD-10-CM | POA: Insufficient documentation

## 2022-08-06 DIAGNOSIS — R0789 Other chest pain: Secondary | ICD-10-CM | POA: Diagnosis not present

## 2022-08-06 DIAGNOSIS — I2510 Atherosclerotic heart disease of native coronary artery without angina pectoris: Secondary | ICD-10-CM | POA: Insufficient documentation

## 2022-08-06 DIAGNOSIS — Z85828 Personal history of other malignant neoplasm of skin: Secondary | ICD-10-CM | POA: Insufficient documentation

## 2022-08-06 DIAGNOSIS — I5032 Chronic diastolic (congestive) heart failure: Secondary | ICD-10-CM | POA: Insufficient documentation

## 2022-08-06 DIAGNOSIS — Z87891 Personal history of nicotine dependence: Secondary | ICD-10-CM | POA: Diagnosis not present

## 2022-08-06 DIAGNOSIS — R0602 Shortness of breath: Secondary | ICD-10-CM | POA: Diagnosis not present

## 2022-08-06 LAB — TROPONIN I (HIGH SENSITIVITY)
Troponin I (High Sensitivity): <2 ng/L (ref <18)
Troponin I (High Sensitivity): 2 ng/L (ref <18)

## 2022-08-06 LAB — CBC
HCT: 49.1 % (ref 39.0–52.0)
Hemoglobin: 16.1 g/dL (ref 13.0–17.0)
MCH: 30.8 pg (ref 26.0–34.0)
MCHC: 32.8 g/dL (ref 30.0–36.0)
MCV: 94.1 fL (ref 80.0–100.0)
Platelets: 330 10*3/uL (ref 150–400)
RBC: 5.22 MIL/uL (ref 4.22–5.81)
RDW: 14.2 % (ref 11.5–15.5)
WBC: 11.4 10*3/uL — ABNORMAL HIGH (ref 4.0–10.5)
nRBC: 0 % (ref 0.0–0.2)

## 2022-08-06 LAB — BASIC METABOLIC PANEL
Anion gap: 12 (ref 5–15)
BUN: 24 mg/dL — ABNORMAL HIGH (ref 8–23)
CO2: 25 mmol/L (ref 22–32)
Calcium: 9.1 mg/dL (ref 8.9–10.3)
Chloride: 102 mmol/L (ref 98–111)
Creatinine, Ser: 1.1 mg/dL (ref 0.61–1.24)
GFR, Estimated: >60 mL/min (ref >60)
Glucose, Bld: 151 mg/dL — ABNORMAL HIGH (ref 70–99)
Potassium: 4.7 mmol/L (ref 3.5–5.1)
Sodium: 139 mmol/L (ref 135–145)

## 2022-08-06 NOTE — ED Triage Notes (Signed)
POV from home . Cc of center chest pain / pressure 1 hour pta. Denies radiation.  +SOB. -N/V.

## 2022-08-07 DIAGNOSIS — R079 Chest pain, unspecified: Secondary | ICD-10-CM | POA: Diagnosis not present

## 2022-08-07 MED ORDER — ALUM & MAG HYDROXIDE-SIMETH 200-200-20 MG/5ML PO SUSP
30.0000 mL | Freq: Once | ORAL | Status: AC
  Administered 2022-08-07: 30 mL via ORAL
  Filled 2022-08-07: qty 30

## 2022-08-07 MED ORDER — LORAZEPAM 1 MG PO TABS
1.0000 mg | ORAL_TABLET | Freq: Once | ORAL | Status: AC
  Administered 2022-08-07: 1 mg via ORAL
  Filled 2022-08-07: qty 1

## 2022-08-07 NOTE — ED Notes (Signed)
Dr. Sedonia Small at bedside. Benjamin Corona Edd Fabian

## 2022-08-07 NOTE — Discharge Instructions (Signed)
You were evaluated in the Emergency Department and after careful evaluation, we did not find any emergent condition requiring admission or further testing in the hospital.  Your exam/testing today is overall reassuring.  Recommend close follow-up with your cardiologist to discuss your symptoms.  Please return to the Emergency Department if you experience any worsening of your condition.   Thank you for allowing [place] to be a part of your care.
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
## 2022-08-12 ENCOUNTER — Telehealth: Payer: Self-pay

## 2022-08-12 ENCOUNTER — Ambulatory Visit (INDEPENDENT_AMBULATORY_CARE_PROVIDER_SITE_OTHER): Payer: Medicare PPO | Admitting: Physician Assistant

## 2022-08-12 VITALS — BP 111/74 | HR 105

## 2022-08-12 DIAGNOSIS — R8281 Pyuria: Secondary | ICD-10-CM

## 2022-08-12 DIAGNOSIS — R300 Dysuria: Secondary | ICD-10-CM | POA: Diagnosis not present

## 2022-08-12 DIAGNOSIS — N401 Enlarged prostate with lower urinary tract symptoms: Secondary | ICD-10-CM

## 2022-08-12 DIAGNOSIS — K5900 Constipation, unspecified: Secondary | ICD-10-CM

## 2022-08-12 LAB — BLADDER SCAN AMB NON-IMAGING: Scan Result: 3

## 2022-08-12 MED ORDER — POLYETHYLENE GLYCOL 3350 17 GM/SCOOP PO POWD: 17.0000 g | Freq: Every day | ORAL | 0 refills | Status: DC

## 2022-08-12 MED ORDER — CEFDINIR 300 MG PO CAPS: 300.0000 mg | ORAL_CAPSULE | Freq: Two times a day (BID) | ORAL | 0 refills | Status: DC

## 2022-08-12 MED ORDER — CIPROFLOXACIN HCL 500 MG PO TABS: 500.0000 mg | ORAL_TABLET | Freq: Two times a day (BID) | ORAL | 0 refills | Status: DC

## 2022-08-12 NOTE — Telephone Encounter (Signed)
Made steve pharmacist aware that rx Omnicef need to be cancel per [facility]. Pharmacist steve voiced understanding.
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
## 2022-08-12 NOTE — Progress Notes (Signed)
Pt here today for bladder scan. Bladder was scanned and 3 was visualized.   Performed by Marisue Brooklyn, CMA  Additional follow up Follow up as scheduled  MDX# [id number]

## 2022-08-13 LAB — URINALYSIS, ROUTINE W REFLEX MICROSCOPIC
Bilirubin, UA: NEGATIVE
Glucose, UA: NEGATIVE
Ketones, UA: NEGATIVE
Nitrite, UA: NEGATIVE
Specific Gravity, UA: 1.03 (ref 1.005–1.030)
Urobilinogen, Ur: 0.2 mg/dL (ref 0.2–1.0)
pH, UA: 5 (ref 5.0–7.5)

## 2022-08-13 LAB — MICROSCOPIC EXAMINATION: Bacteria, UA: NONE SEEN

## 2022-08-19 ENCOUNTER — Ambulatory Visit (INDEPENDENT_AMBULATORY_CARE_PROVIDER_SITE_OTHER): Payer: Medicare PPO | Admitting: Urology

## 2022-08-19 ENCOUNTER — Encounter: Payer: Self-pay | Admitting: Urology

## 2022-08-19 VITALS — BP 111/77 | HR 82

## 2022-08-19 DIAGNOSIS — N401 Enlarged prostate with lower urinary tract symptoms: Secondary | ICD-10-CM | POA: Diagnosis not present

## 2022-08-19 DIAGNOSIS — R300 Dysuria: Secondary | ICD-10-CM

## 2022-08-19 LAB — URINALYSIS, ROUTINE W REFLEX MICROSCOPIC
Bilirubin, UA: NEGATIVE
Glucose, UA: NEGATIVE
Ketones, UA: NEGATIVE
Nitrite, UA: NEGATIVE
Specific Gravity, UA: 1.03 (ref 1.005–1.030)
Urobilinogen, Ur: 0.2 mg/dL (ref 0.2–1.0)
pH, UA: 5.5 (ref 5.0–7.5)

## 2022-08-19 LAB — BLADDER SCAN AMB NON-IMAGING: Scan Result: 8

## 2022-08-19 LAB — MICROSCOPIC EXAMINATION: WBC, UA: >30 /hpf — AB (ref 0–5)

## 2022-08-19 MED ORDER — CIPROFLOXACIN HCL 250 MG PO TABS: 250.0000 mg | ORAL_TABLET | Freq: Every day | ORAL | 0 refills | Status: DC

## 2022-08-19 MED ORDER — MELOXICAM 7.5 MG PO TABS: 7.5000 mg | ORAL_TABLET | Freq: Every day | ORAL | 0 refills | Status: DC

## 2022-08-19 NOTE — Progress Notes (Unsigned)
Pt here today for bladder scan. Bladder was scanned and 8 was visualized.    Performed by Marisue Brooklyn, CMA
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
## 2022-09-02 ENCOUNTER — Other Ambulatory Visit: Payer: Self-pay | Admitting: Cardiology

## 2022-09-02 ENCOUNTER — Ambulatory Visit (INDEPENDENT_AMBULATORY_CARE_PROVIDER_SITE_OTHER): Payer: Medicare PPO | Admitting: Urology

## 2022-09-02 ENCOUNTER — Encounter: Payer: Self-pay | Admitting: Urology

## 2022-09-02 VITALS — BP 109/65 | HR 96 | Ht 71.5 in | Wt 191.0 lb

## 2022-09-02 DIAGNOSIS — N401 Enlarged prostate with lower urinary tract symptoms: Secondary | ICD-10-CM

## 2022-09-02 DIAGNOSIS — R300 Dysuria: Secondary | ICD-10-CM

## 2022-09-02 MED ORDER — MELOXICAM 7.5 MG PO TABS: 7.5000 mg | ORAL_TABLET | Freq: Every day | ORAL | 0 refills | Status: AC
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
## 2022-09-03 ENCOUNTER — Telehealth: Payer: Self-pay | Admitting: Internal Medicine

## 2022-09-03 NOTE — Telephone Encounter (Signed)
PA for repatha submitted via CMM (Key: [id number])

## 2022-09-05 ENCOUNTER — Other Ambulatory Visit: Payer: Self-pay | Admitting: Internal Medicine

## 2022-09-05 DIAGNOSIS — N400 Enlarged prostate without lower urinary tract symptoms: Secondary | ICD-10-CM

## 2022-09-05 NOTE — Telephone Encounter (Signed)
Approved per [facility] message The authorization is valid from 08/04/2022 through 09/03/2023. A letter of explanation will also be mailed to the patient.

## 2022-09-23 ENCOUNTER — Encounter: Payer: Medicare Other | Admitting: Family Medicine

## 2022-09-23 ENCOUNTER — Ambulatory Visit (INDEPENDENT_AMBULATORY_CARE_PROVIDER_SITE_OTHER): Payer: Medicare PPO

## 2022-09-23 VITALS — BP 116/78 | HR 93 | Wt 191.0 lb

## 2022-09-23 DIAGNOSIS — Z0000 Encounter for general adult medical examination without abnormal findings: Secondary | ICD-10-CM
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
## 2022-09-23 NOTE — Patient Instructions (Signed)
  [name] , Thank you for taking time to come for your Medicare Wellness Visit. I appreciate your ongoing commitment to your health goals. Please review the following plan we discussed and let me know if I can assist you in the future.   These are the goals we discussed:  Goals       Patient Stated (pt-stated)      None        This is a list of the screening recommended for you and due dates:  Health Maintenance  Topic Date Due   Zoster (Shingles) Vaccine (1 of 2) Never done   COVID-19 Vaccine (3 - Pfizer risk series) 01/09/2020   Yearly kidney health urinalysis for diabetes  09/05/2021   Complete foot exam   09/06/2021   Screening for Lung Cancer  10/17/2022   Eye exam for diabetics  10/23/2022   Hemoglobin A1C  11/20/2022   Yearly kidney function blood test for diabetes  08/07/2023   Medicare Annual Wellness Visit  09/24/2023   DTaP/Tdap/Td vaccine (4 - Td or Tdap) 03/31/2027   Pneumonia Vaccine  Completed   Flu Shot  Completed   HPV Vaccine  Aged Out

## 2022-09-26 DIAGNOSIS — H6123 Impacted cerumen, bilateral: Secondary | ICD-10-CM | POA: Diagnosis not present

## 2022-10-29 LAB — HM DIABETES EYE EXAM

## 2022-11-01 ENCOUNTER — Ambulatory Visit (INDEPENDENT_AMBULATORY_CARE_PROVIDER_SITE_OTHER): Payer: Medicare PPO | Admitting: Urology

## 2022-11-01 DIAGNOSIS — R339 Retention of urine, unspecified: Secondary | ICD-10-CM | POA: Diagnosis not present

## 2022-11-01 LAB — URINALYSIS, ROUTINE W REFLEX MICROSCOPIC
Bilirubin, UA: NEGATIVE
Glucose, UA: NEGATIVE
Ketones, UA: NEGATIVE
Nitrite, UA: NEGATIVE
RBC, UA: NEGATIVE
Specific Gravity, UA: 1.025 (ref 1.005–1.030)
Urobilinogen, Ur: 0.2 mg/dL (ref 0.2–1.0)
pH, UA: 5 (ref 5.0–7.5)

## 2022-11-01 LAB — BLADDER SCAN AMB NON-IMAGING: Scan Result: 67

## 2022-11-01 NOTE — Progress Notes (Signed)
Patient came in to today and voiced that he started experiencing not emptying when voiding. Patient states he stop taking his Flomax and Myrbetriq. Patient states that he notice not emptying when voiding when he stop his Flomax and Myrebetriq. Patient stated he taking allergy meds. Patient is aware that he can continued taking his allergy meds as long it do not have a decongestant in in the allergy meds.  Pt here today for bladder scan. Bladder was scanned and 67 was visualized.   Performed by Marisue Brooklyn, CMA  Additional follow up Start Flomax 11/01/22 and start Myrbetriq in a week and half around 11/13/22.

## 2022-11-15 ENCOUNTER — Other Ambulatory Visit: Payer: Self-pay

## 2022-11-15 DIAGNOSIS — C61 Malignant neoplasm of prostate: Secondary | ICD-10-CM

## 2022-11-18 ENCOUNTER — Other Ambulatory Visit (HOSPITAL_COMMUNITY)
Admission: RE | Admit: 2022-11-18 | Discharge: 2022-11-18 | Disposition: A | Discharge status: Home / Self Care | Payer: Medicare PPO | Source: Ambulatory Visit | Attending: Internal Medicine | Admitting: Internal Medicine

## 2022-11-18 DIAGNOSIS — R7303 Prediabetes: Secondary | ICD-10-CM | POA: Diagnosis not present

## 2022-11-18 DIAGNOSIS — I2510 Atherosclerotic heart disease of native coronary artery without angina pectoris: Secondary | ICD-10-CM | POA: Insufficient documentation

## 2022-11-18 DIAGNOSIS — N400 Enlarged prostate without lower urinary tract symptoms: Secondary | ICD-10-CM | POA: Diagnosis not present

## 2022-11-18 DIAGNOSIS — E559 Vitamin D deficiency, unspecified: Secondary | ICD-10-CM | POA: Insufficient documentation

## 2022-11-18 DIAGNOSIS — C61 Malignant neoplasm of prostate: Secondary | ICD-10-CM | POA: Diagnosis not present

## 2022-11-18 DIAGNOSIS — K580 Irritable bowel syndrome with diarrhea: Secondary | ICD-10-CM | POA: Diagnosis not present

## 2022-11-18 LAB — LIPID PANEL
Cholesterol: 80 mg/dL (ref 0–200)
HDL: 36 mg/dL — ABNORMAL LOW (ref >40)
LDL Cholesterol: 26 mg/dL (ref 0–99)
Total CHOL/HDL Ratio: 2.2 RATIO
Triglycerides: 88 mg/dL (ref <150)
VLDL: 18 mg/dL (ref 0–40)

## 2022-11-18 LAB — CBC WITH DIFFERENTIAL/PLATELET
Abs Immature Granulocytes: 0.01 10*3/uL (ref 0.00–0.07)
Basophils Absolute: 0.1 10*3/uL (ref 0.0–0.1)
Basophils Relative: 1 %
Eosinophils Absolute: 0.3 10*3/uL (ref 0.0–0.5)
Eosinophils Relative: 6 %
HCT: 46.7 % (ref 39.0–52.0)
Hemoglobin: 15.4 g/dL (ref 13.0–17.0)
Immature Granulocytes: 0 %
Lymphocytes Relative: 28 %
Lymphs Abs: 1.5 10*3/uL (ref 0.7–4.0)
MCH: 31 pg (ref 26.0–34.0)
MCHC: 33 g/dL (ref 30.0–36.0)
MCV: 94.2 fL (ref 80.0–100.0)
Monocytes Absolute: 0.5 10*3/uL (ref 0.1–1.0)
Monocytes Relative: 9 %
Neutro Abs: 2.9 10*3/uL (ref 1.7–7.7)
Neutrophils Relative %: 56 %
Platelets: 249 10*3/uL (ref 150–400)
RBC: 4.96 MIL/uL (ref 4.22–5.81)
RDW: 13.1 % (ref 11.5–15.5)
WBC: 5.3 10*3/uL (ref 4.0–10.5)
nRBC: 0 % (ref 0.0–0.2)

## 2022-11-18 LAB — COMPREHENSIVE METABOLIC PANEL
ALT: 17 U/L (ref 0–44)
AST: 23 U/L (ref 15–41)
Albumin: 4 g/dL (ref 3.5–5.0)
Alkaline Phosphatase: 49 U/L (ref 38–126)
Anion gap: 8 (ref 5–15)
BUN: 13 mg/dL (ref 8–23)
CO2: 24 mmol/L (ref 22–32)
Calcium: 8.9 mg/dL (ref 8.9–10.3)
Chloride: 108 mmol/L (ref 98–111)
Creatinine, Ser: 1.19 mg/dL (ref 0.61–1.24)
GFR, Estimated: >60 mL/min (ref >60)
Glucose, Bld: 141 mg/dL — ABNORMAL HIGH (ref 70–99)
Potassium: 4.1 mmol/L (ref 3.5–5.1)
Sodium: 140 mmol/L (ref 135–145)
Total Bilirubin: 1.7 mg/dL — ABNORMAL HIGH (ref 0.3–1.2)
Total Protein: 7 g/dL (ref 6.5–8.1)

## 2022-11-18 LAB — TSH: TSH: 2.884 u[IU]/mL (ref 0.350–4.500)

## 2022-11-18 LAB — PSA: Prostatic Specific Antigen: 2.22 ng/mL (ref 0.00–4.00)

## 2022-11-18 LAB — VITAMIN D 25 HYDROXY (VIT D DEFICIENCY, FRACTURES): Vit D, 25-Hydroxy: 26.99 ng/mL — ABNORMAL LOW (ref 30–100)

## 2022-11-19 LAB — HEMOGLOBIN A1C
Hgb A1c MFr Bld: 6.3 % — ABNORMAL HIGH (ref 4.8–5.6)
Mean Plasma Glucose: 134 mg/dL

## 2022-11-22 ENCOUNTER — Ambulatory Visit (INDEPENDENT_AMBULATORY_CARE_PROVIDER_SITE_OTHER): Payer: Medicare PPO | Admitting: Internal Medicine

## 2022-11-22 ENCOUNTER — Encounter: Payer: Self-pay | Admitting: Internal Medicine

## 2022-11-22 VITALS — BP 111/76 | HR 99 | Ht 71.5 in | Wt 191.0 lb

## 2022-11-22 DIAGNOSIS — J441 Chronic obstructive pulmonary disease with (acute) exacerbation: Secondary | ICD-10-CM

## 2022-11-22 DIAGNOSIS — Z0001 Encounter for general adult medical examination with abnormal findings: Secondary | ICD-10-CM | POA: Diagnosis not present

## 2022-11-22 DIAGNOSIS — R351 Nocturia: Secondary | ICD-10-CM | POA: Diagnosis not present

## 2022-11-22 DIAGNOSIS — R7303 Prediabetes: Secondary | ICD-10-CM

## 2022-11-22 DIAGNOSIS — I2510 Atherosclerotic heart disease of native coronary artery without angina pectoris: Secondary | ICD-10-CM

## 2022-11-22 DIAGNOSIS — N401 Enlarged prostate with lower urinary tract symptoms: Secondary | ICD-10-CM

## 2022-11-22 DIAGNOSIS — Z23 Encounter for immunization: Secondary | ICD-10-CM

## 2022-11-22 DIAGNOSIS — J0110 Acute frontal sinusitis, unspecified: Secondary | ICD-10-CM | POA: Diagnosis not present

## 2022-11-22 MED ORDER — AMOXICILLIN-POT CLAVULANATE 875-125 MG PO TABS: 1.0000 | ORAL_TABLET | Freq: Two times a day (BID) | ORAL | 0 refills | Status: DC

## 2022-11-22 MED ORDER — TIOTROPIUM BROMIDE-OLODATEROL 2.5-2.5 MCG/ACT IN AERS: 2.0000 | INHALATION_SPRAY | Freq: Every day | RESPIRATORY_TRACT | 3 refills | Status: DC

## 2022-11-22 NOTE — Assessment & Plan Note (Signed)
S/p CABG Followed by cardiology On metoprolol Denies chest pain or dyspnea currently On Repatha -has statin intolerance 
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
## 2022-11-22 NOTE — Assessment & Plan Note (Signed)
S/p TURP Followed by Urology [facility] with Flomax 0.8 mg QD

## 2022-11-22 NOTE — Assessment & Plan Note (Signed)
Uncontrolled Added Stiolto as he has had recurrent dyspnea and wheezing, leading to albuterol use Refilled albuterol as needed for dyspnea or wheezing

## 2022-11-22 NOTE — Assessment & Plan Note (Signed)
Lab Results  Component Value Date   HGBA1C 6.3 (H) 11/18/2022   Advised to follow low carb diet for now

## 2022-11-22 NOTE — Assessment & Plan Note (Addendum)
Physical exam as documented. Fasting blood tests reviewed today. PCV20 vaccine today. Advised to get Shingrix vaccines at local pharmacy.

## 2022-11-22 NOTE — Patient Instructions (Addendum)
Please start taking Stiolto as prescribed.  Please continue to use Albuterol as needed for shortness of breath.  Please start taking Augmentin as prescribed for acute sinusitis. Please use humidifier and/or vaporizer for nasal congestion.  Please continue to take medications as prescribed.  Please continue to follow low carb diet and perform moderate exercise/walking at least 150 mins/week.

## 2022-11-22 NOTE — Assessment & Plan Note (Signed)
Started empiric Augmentin as he has persistent symptoms despite symptomatic treatment Xyzal for allergies Nasal saline spray PRN Advised to use humidifier or vaporizer at home

## 2022-11-25 LAB — MICROALBUMIN / CREATININE URINE RATIO
Creatinine, Urine: 120.6 mg/dL
Microalb/Creat Ratio: 12 mg/g creat (ref 0–29)
Microalbumin, Urine: 14.8 ug/mL

## 2022-12-25 ENCOUNTER — Ambulatory Visit (HOSPITAL_COMMUNITY)
Admission: RE | Admit: 2022-12-25 | Discharge: 2022-12-25 | Disposition: A | Discharge status: Home / Self Care | Payer: Medicare PPO | Source: Ambulatory Visit | Attending: Cardiology | Admitting: Cardiology

## 2022-12-25 ENCOUNTER — Ambulatory Visit: Payer: Medicare PPO | Attending: Cardiology | Admitting: Cardiology

## 2022-12-25 ENCOUNTER — Encounter: Payer: Self-pay | Admitting: Cardiology

## 2022-12-25 VITALS — BP 118/78 | HR 68 | Wt 202.4 lb

## 2022-12-25 DIAGNOSIS — R0602 Shortness of breath: Secondary | ICD-10-CM | POA: Diagnosis not present

## 2022-12-25 DIAGNOSIS — R059 Cough, unspecified: Secondary | ICD-10-CM

## 2022-12-25 DIAGNOSIS — I25119 Atherosclerotic heart disease of native coronary artery with unspecified angina pectoris: Secondary | ICD-10-CM

## 2022-12-25 NOTE — Progress Notes (Signed)
Cardiology Office Note  Date: 12/25/2022   ID: Benjamin [name], DOB July 25, 1941, MRN [id number]  History of Present Illness: Benjamin [name] is an 82 y.o. male last seen October 2023.  He is here today with his wife for a follow-up visit.  Reports no angina or palpitations on current regimen.  He states that he has had trouble with intermittent cough, chest congestion, wheezing, and rhinorrhea over the last month.  Felt that he may have had "pneumonia."  He did see his PCP and has been using MDIs as well as antihistamines, symptoms persist.  No fevers or chills.  Chest x-ray was normal in November 2023.  He did undergo thulium laser vaporization of prostate with Dr. Mena Goes back in November 2023, no obvious perioperative cardiac events.  I reviewed his cardiac regimen and also Myoview from October 2023.  We discussed getting a chest x-ray and having him go back to see PCP.  Might benefit from a course of oral steroids or even referral to Pulmonary.  Physical Exam: VS:  BP 118/78 (BP Location: Left Arm, Patient Position: Sitting, Cuff Size: Normal)   Pulse 68   Wt 202 lb 6.4 oz (91.8 kg)   SpO2 92%   BMI 27.84 kg/m , BMI Body mass index is 27.84 kg/m.  Wt Readings from Last 3 Encounters:  12/25/22 202 lb 6.4 oz (91.8 kg)  11/22/22 191 lb (86.6 kg)  09/23/22 191 lb (86.6 kg)    General: Patient appears comfortable at rest. HEENT: Conjunctiva and lids normal. Neck: Supple, no elevated JVP or carotid bruits. Lungs: Decreased breath sounds without active wheezing. Cardiac: Regular rate and rhythm, no S3, 1/6 systolic murmur, no pericardial rub. Extremities: No pitting edema.  ECG:  An ECG dated 08/06/2022 was personally reviewed today and demonstrated:  Sinus rhythm with borderline low voltage, incomplete right bundle branch block.  Labwork: 11/18/2022: ALT 17; AST 23; BUN 13; Creatinine, Ser 1.19; Hemoglobin 15.4; Platelets 249; Potassium 4.1; Sodium 140; TSH 2.884     Component Value  Date/Time   CHOL 80 11/18/2022 1018   CHOL 91 (L) 12/18/2021 0833   TRIG 88 11/18/2022 1018   HDL 36 (L) 11/18/2022 1018   HDL 39 (L) 12/18/2021 0833   CHOLHDL 2.2 11/18/2022 1018   VLDL 18 11/18/2022 1018   LDLCALC 26 11/18/2022 1018   LDLCALC 37 12/18/2021 0833   LDLCALC 20 09/05/2020 5883   Other Studies Reviewed Today:  No interval cardiac testing for review today.  Assessment and Plan:  1.  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  Echocardiogram in January 2023 revealed LVEF 60 to 65%.  Follow-up Lexiscan Myoview in October 2023 showed mild ischemia in the basal inferior wall per my review of images with LVEF 71%.  He does not describe any definitive angina at this time.  He is currently on Lopressor and Repatha.  Continue medical therapy and observation.  Can always consider invasive cardiac testing if dyspnea on exertion worsens without obvious pulmonary etiology.  2.  Dyspnea on exertion.  Overall chronic since COVID-19 last year, but more recently with URI sounding symptoms.  Plan to repeat PA lateral chest x-ray, I recommended that he go back to see his PCP, might benefit from course of oral steroids or referral to Pulmonary.  States that MDIs and antihistamines have not been effective.  2.  Mixed hyperlipidemia with statin myalgias.  He remains  on Repatha, recent LDL 26.  Disposition:  Follow up  3 months.  Signed, Jonelle Sidle, M.D., F.A.C.C. [facility]

## 2022-12-25 NOTE — Patient Instructions (Addendum)
Medication Instructions:  Your physician recommends that you continue on your current medications as directed. Please refer to the Current Medication list given to you today.  Labwork: none  Testing/Procedures: A chest x-ray takes a picture of the organs and structures inside the chest, including the heart, lungs, and blood vessels. This test can show several things, including, whether the heart is enlarges; whether fluid is building up in the lungs; and whether pacemaker / defibrillator leads are still in place.  Follow-Up: Your physician recommends that you schedule a follow-up appointment in: 3 months  Any Other Special Instructions Will Be Listed Below (If Applicable).  If you need a refill on your cardiac medications before your next appointment, please call your pharmacy.

## 2022-12-31 ENCOUNTER — Encounter: Payer: Self-pay | Admitting: Internal Medicine

## 2022-12-31 ENCOUNTER — Ambulatory Visit (INDEPENDENT_AMBULATORY_CARE_PROVIDER_SITE_OTHER): Payer: Medicare PPO | Admitting: Internal Medicine

## 2022-12-31 VITALS — BP 107/71 | HR 109 | Ht 71.5 in | Wt 194.0 lb

## 2022-12-31 DIAGNOSIS — J9611 Chronic respiratory failure with hypoxia: Secondary | ICD-10-CM | POA: Diagnosis not present

## 2022-12-31 DIAGNOSIS — J441 Chronic obstructive pulmonary disease with (acute) exacerbation: Secondary | ICD-10-CM | POA: Diagnosis not present

## 2022-12-31 DIAGNOSIS — I5032 Chronic diastolic (congestive) heart failure: Secondary | ICD-10-CM

## 2022-12-31 DIAGNOSIS — J0110 Acute frontal sinusitis, unspecified: Secondary | ICD-10-CM | POA: Diagnosis not present

## 2022-12-31 MED ORDER — PREDNISONE 10 MG (21) PO TBPK
ORAL_TABLET | ORAL | 0 refills | Status: DC
Start: 2022-12-31 — End: 2023-01-16

## 2022-12-31 MED ORDER — AZITHROMYCIN 250 MG PO TABS
ORAL_TABLET | ORAL | 0 refills | Status: AC
Start: 2022-12-31 — End: 2023-01-05

## 2022-12-31 NOTE — Patient Instructions (Addendum)
Please start taking Prednisone and Azithromycin as prescribed.  Please use Stiolto and as needed albuterol as prescribed.

## 2022-12-31 NOTE — Assessment & Plan Note (Signed)
Has a history of HFpEF Has mild leg swelling He had recent cardiology evaluation If persistent dyspnea, may add Lasix
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
## 2022-12-31 NOTE — Assessment & Plan Note (Signed)
Uncontrolled Started Sterapred taper and azithromycin for acute exacerbation of COPD Added Stiolto as he has had recurrent dyspnea and wheezing, leading to albuterol use Refilled albuterol as needed for dyspnea or wheezing Has oxygen desaturation upon ambulation, needs home O2 for as needed use/upon ambulation

## 2022-12-31 NOTE — Assessment & Plan Note (Signed)
O2 sat at rest: 90% O2 sat upon ambulation: 86% O2 sat with O2 upon ambulation: 92%   Likely due to COPD Needs home O2 for ambulation

## 2022-12-31 NOTE — Assessment & Plan Note (Signed)
Started empiric azithromycin as he has persistent symptoms despite symptomatic treatment Xyzal for allergies Nasal saline spray PRN Advised to use humidifier or vaporizer at home

## 2023-01-01 DIAGNOSIS — J449 Chronic obstructive pulmonary disease, unspecified: Secondary | ICD-10-CM | POA: Diagnosis not present

## 2023-01-01 DIAGNOSIS — J479 Bronchiectasis, uncomplicated: Secondary | ICD-10-CM | POA: Diagnosis not present

## 2023-01-06 ENCOUNTER — Other Ambulatory Visit: Payer: Self-pay | Admitting: Internal Medicine

## 2023-01-16 ENCOUNTER — Encounter: Payer: Self-pay | Admitting: Internal Medicine

## 2023-01-16 ENCOUNTER — Ambulatory Visit (INDEPENDENT_AMBULATORY_CARE_PROVIDER_SITE_OTHER): Payer: Medicare PPO | Admitting: Internal Medicine

## 2023-01-16 VITALS — BP 113/77 | HR 96 | Ht 71.0 in | Wt 197.4 lb

## 2023-01-16 DIAGNOSIS — R7303 Prediabetes: Secondary | ICD-10-CM

## 2023-01-16 DIAGNOSIS — J441 Chronic obstructive pulmonary disease with (acute) exacerbation: Secondary | ICD-10-CM | POA: Diagnosis not present

## 2023-01-16 DIAGNOSIS — J0111 Acute recurrent frontal sinusitis: Secondary | ICD-10-CM

## 2023-01-16 DIAGNOSIS — J9611 Chronic respiratory failure with hypoxia: Secondary | ICD-10-CM | POA: Diagnosis not present

## 2023-01-16 MED ORDER — MOXIFLOXACIN HCL 400 MG PO TABS
400.0000 mg | ORAL_TABLET | Freq: Every day | ORAL | 0 refills | Status: AC
Start: 2023-01-16 — End: 2023-01-23

## 2023-01-16 MED ORDER — PREDNISONE 10 MG PO TABS
ORAL_TABLET | ORAL | 0 refills | Status: AC
Start: 2023-01-16 — End: 2023-01-31
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
## 2023-01-16 NOTE — Assessment & Plan Note (Signed)
Started Moxifloxacin as he has persistent symptoms despite symptomatic treatment and Augmentin, Azithromycin - although symptoms had improved initially Started Prednisone taper X 2 weeks Xyzal for allergies, avoid daily decongestant use Nasal saline spray PRN Advised to use humidifier or vaporizer at home

## 2023-01-16 NOTE — Assessment & Plan Note (Signed)
Lab Results  Component Value Date   HGBA1C 6.3 (H) 11/18/2022   Advised to follow low carb diet for now 

## 2023-01-16 NOTE — Assessment & Plan Note (Signed)
O2 sat at rest: 90% O2 sat upon ambulation: 86% O2 sat with O2 upon ambulation: 92%   Likely due to COPD Has home O2 for ambulation

## 2023-01-16 NOTE — Patient Instructions (Addendum)
Please start taking Moxifloxacin once daily.  Please start taking Prednisone as prescribed.  Please use Claritin or Allegra for allergies.

## 2023-01-16 NOTE — Assessment & Plan Note (Signed)
Uncontrolled Started Prednisone taper and Moxifloxacin for sinusitis as well Added Stiolto as he has had recurrent dyspnea and wheezing, leading to albuterol use Refilled albuterol as needed for dyspnea or wheezing Has oxygen desaturation upon ambulation, needs home O2 for as needed use/upon ambulation

## 2023-01-22 ENCOUNTER — Other Ambulatory Visit: Payer: Self-pay | Admitting: Internal Medicine

## 2023-01-22 ENCOUNTER — Telehealth: Payer: Self-pay | Admitting: Internal Medicine

## 2023-01-22 DIAGNOSIS — J441 Chronic obstructive pulmonary disease with (acute) exacerbation: Secondary | ICD-10-CM

## 2023-01-22 MED ORDER — IPRATROPIUM-ALBUTEROL 0.5-2.5 (3) MG/3ML IN SOLN
RESPIRATORY_TRACT | 0 refills | Status: DC
Start: 2023-01-22 — End: 2023-08-01

## 2023-01-22 NOTE — Telephone Encounter (Signed)
Patient spouse came by the office and needs nebulized version of meds equal to meds Spiriva respirate to [facility]   Patient has the nebulizer already needs the medicine Spiriva respirate   [facility]

## 2023-01-23 ENCOUNTER — Other Ambulatory Visit: Payer: Self-pay | Admitting: Internal Medicine

## 2023-01-23 DIAGNOSIS — N400 Enlarged prostate without lower urinary tract symptoms: Secondary | ICD-10-CM

## 2023-01-27 ENCOUNTER — Ambulatory Visit (INDEPENDENT_AMBULATORY_CARE_PROVIDER_SITE_OTHER): Payer: Medicare PPO | Admitting: Urology

## 2023-01-27 ENCOUNTER — Encounter: Payer: Self-pay | Admitting: Urology

## 2023-01-27 VITALS — BP 134/77 | HR 65

## 2023-01-27 DIAGNOSIS — R3912 Poor urinary stream: Secondary | ICD-10-CM | POA: Diagnosis not present

## 2023-01-27 DIAGNOSIS — N138 Other obstructive and reflux uropathy: Secondary | ICD-10-CM

## 2023-01-27 DIAGNOSIS — N401 Enlarged prostate with lower urinary tract symptoms: Secondary | ICD-10-CM

## 2023-01-27 MED ORDER — CIPROFLOXACIN HCL 500 MG PO TABS: 500.0000 mg | ORAL_TABLET | Freq: Two times a day (BID) | ORAL | 0 refills | Status: AC
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
## 2023-01-30 DIAGNOSIS — J449 Chronic obstructive pulmonary disease, unspecified: Secondary | ICD-10-CM | POA: Diagnosis not present

## 2023-01-31 ENCOUNTER — Ambulatory Visit (INDEPENDENT_AMBULATORY_CARE_PROVIDER_SITE_OTHER): Payer: Medicare PPO | Admitting: Internal Medicine

## 2023-01-31 ENCOUNTER — Encounter: Payer: Self-pay | Admitting: Internal Medicine

## 2023-01-31 VITALS — BP 103/70 | HR 96 | Ht 71.0 in | Wt 191.0 lb

## 2023-01-31 DIAGNOSIS — J441 Chronic obstructive pulmonary disease with (acute) exacerbation: Secondary | ICD-10-CM | POA: Diagnosis not present

## 2023-01-31 DIAGNOSIS — J329 Chronic sinusitis, unspecified: Secondary | ICD-10-CM

## 2023-01-31 DIAGNOSIS — J479 Bronchiectasis, uncomplicated: Secondary | ICD-10-CM | POA: Diagnosis not present

## 2023-01-31 DIAGNOSIS — J471 Bronchiectasis with (acute) exacerbation: Secondary | ICD-10-CM | POA: Insufficient documentation

## 2023-01-31 DIAGNOSIS — J449 Chronic obstructive pulmonary disease, unspecified: Secondary | ICD-10-CM | POA: Diagnosis not present

## 2023-01-31 DIAGNOSIS — J9611 Chronic respiratory failure with hypoxia: Secondary | ICD-10-CM

## 2023-01-31 NOTE — Assessment & Plan Note (Signed)
Has had recurrent pneumonia and COPD exacerbation Last CT chest showed peribronchovascular groundglass opacities Has chronic cough and mucoid secretion Has tried manual fasciculating devices such as flutter and deep breathing without much relief Would benefit from aflovest

## 2023-01-31 NOTE — Assessment & Plan Note (Signed)
He has persistent symptoms despite symptomatic treatment and moxifloxacin, Augmentin, Azithromycin - although symptoms had improved initially Completed prednisone taper X 2 weeks Xyzal for allergies, avoid daily decongestant use Nasal saline spray PRN Advised to use humidifier or vaporizer at home Referred to allergy and immunology

## 2023-01-31 NOTE — Patient Instructions (Signed)
Please continue using Duoneb twice daily and as needed for shortness of breath or wheezing.  Please use Aflovest for chest vibrations to help with cough.

## 2023-01-31 NOTE — Assessment & Plan Note (Addendum)
O2 sat at rest: 90% O2 sat upon ambulation: 86% O2 sat with O2 upon ambulation: 92%   Likely due to COPD Has home O2 for ambulation 
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
## 2023-01-31 NOTE — Assessment & Plan Note (Addendum)
Uncontrolled Check sputum culture Completed prednisone taper and Moxifloxacin for sinusitis as well, has recurrent symptoms Added Stiolto as he has had recurrent dyspnea and wheezing, leading to albuterol use, was expensive Started DuoNeb QID and PRN Refilled albuterol as needed for dyspnea or wheezing Has oxygen desaturation upon ambulation, needs home O2 for as needed use/upon ambulation

## 2023-02-03 ENCOUNTER — Ambulatory Visit: Payer: Medicare PPO | Admitting: Internal Medicine

## 2023-02-14 DIAGNOSIS — J479 Bronchiectasis, uncomplicated: Secondary | ICD-10-CM | POA: Diagnosis not present

## 2023-03-03 ENCOUNTER — Ambulatory Visit (INDEPENDENT_AMBULATORY_CARE_PROVIDER_SITE_OTHER): Payer: Medicare PPO | Admitting: Urology

## 2023-03-03 ENCOUNTER — Encounter: Payer: Self-pay | Admitting: Urology

## 2023-03-03 VITALS — BP 110/70 | HR 63

## 2023-03-03 DIAGNOSIS — N138 Other obstructive and reflux uropathy: Secondary | ICD-10-CM

## 2023-03-03 DIAGNOSIS — J479 Bronchiectasis, uncomplicated: Secondary | ICD-10-CM | POA: Diagnosis not present

## 2023-03-03 DIAGNOSIS — N401 Enlarged prostate with lower urinary tract symptoms: Secondary | ICD-10-CM | POA: Diagnosis not present

## 2023-03-03 DIAGNOSIS — R3912 Poor urinary stream: Secondary | ICD-10-CM

## 2023-03-03 DIAGNOSIS — J449 Chronic obstructive pulmonary disease, unspecified: Secondary | ICD-10-CM | POA: Diagnosis not present

## 2023-03-03 MED ORDER — CIPROFLOXACIN HCL 500 MG PO TABS
500.0000 mg | ORAL_TABLET | Freq: Once | ORAL | Status: AC
Start: 2023-03-03 — End: 2023-03-04
  Administered 2023-03-04: 500 mg via ORAL

## 2023-03-03 NOTE — Progress Notes (Unsigned)
   03/03/23  CC: No chief complaint on file.   HPI:  F/u -    1) BPH with lower urinary tract symptoms-he underwent TLVP Nov 2023. He had a UTI and dysuria post-op. He complained of many LUTS - AUASS = 26. His prostate measured 65 g on CT scan February 2023 with an otherwise benign CT.  Denies gross hematuria. He is on tamsulosin for years. He started finasteride Feb 2023. Not as sexually active.    Post void 80 mL.  June 2023 cystoscopy with Moderate BPH, lateral lobe hypertrophy with a small median lobe, visual obstruction.    He has noted a slow and weak stream. He restarted tamsulosin by May 2024. With tamsulosin he has a good stream.   2) PSA elevation-his PSA has ranged from 2.4-4.3.  Associated with a 65 g prostate.  Last PSA April 2021 of 2.4. His May 2023 PSA was 2.5. His PSA was 2.22 in Mar 2024.    3) ED - for many years. Tried pde5i without a lot of success. Rx for sildenafil in Sep 2023.     Today, Claudio is seen for the above for cystoscopy. No dysuria or gross hematuria. Cystoscopy with mild BNC and some residual lateral lobe hypertrophy.  He started Cipro yesterday in prep for today. On tamsulosin 0.8.   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 No blood thinners.  Blood pressure 110/70, pulse 63. NED. A&Ox3.   No respiratory distress   Abd soft, NT, ND Normal phallus with bilateral descended testicles  Cystoscopy Procedure Note  Patient identification was confirmed, informed consent was obtained, and patient was prepped using Betadine solution.  Lidocaine jelly was administered per urethral meatus.     Pre-Procedure: - Inspection reveals a normal caliber ureteral meatus.  Procedure: The flexible cystoscope was introduced without difficulty - No urethral strictures/lesions are present. - mild lateral lobe hypertrophy prostate borderline BOO - tight bladder neck - mild to moderate contracture  - Bilateral ureteral orifices identified - Bladder mucosa  reveals  no ulcers, tumors, or lesions - No bladder stones - No trabeculation  Retroflexion shows normal bladder neck    Post-Procedure: - Patient tolerated the procedure well  Assessment/ Plan:  Bph - weak st - disc nature r/b/a to TURP. He will consider.  No follow-ups on file.  Jerilee Field, MD

## 2023-03-04 NOTE — Progress Notes (Unsigned)
Patient did not take Cipro 500 mg, left without taking medicine.

## 2023-03-05 ENCOUNTER — Other Ambulatory Visit: Payer: Self-pay

## 2023-03-05 ENCOUNTER — Ambulatory Visit: Payer: Medicare PPO | Admitting: Allergy & Immunology

## 2023-03-05 ENCOUNTER — Encounter: Payer: Self-pay | Admitting: Allergy & Immunology

## 2023-03-05 VITALS — BP 112/72 | HR 96 | Temp 98.7°F | Resp 18 | Ht 69.69 in | Wt 192.0 lb

## 2023-03-05 DIAGNOSIS — J449 Chronic obstructive pulmonary disease, unspecified: Secondary | ICD-10-CM | POA: Diagnosis not present

## 2023-03-05 DIAGNOSIS — J302 Other seasonal allergic rhinitis: Secondary | ICD-10-CM

## 2023-03-05 DIAGNOSIS — J3089 Other allergic rhinitis: Secondary | ICD-10-CM

## 2023-03-05 MED ORDER — CARBINOXAMINE MALEATE 4 MG PO TABS: 4.0000 mg | ORAL_TABLET | Freq: Three times a day (TID) | ORAL | 5 refills | Status: DC

## 2023-03-05 MED ORDER — LEVALBUTEROL TARTRATE 45 MCG/ACT IN AERO: 2.0000 | INHALATION_SPRAY | Freq: Four times a day (QID) | RESPIRATORY_TRACT | 1 refills | Status: DC | PRN

## 2023-03-05 MED ORDER — MONTELUKAST SODIUM 10 MG PO TABS
10.0000 mg | ORAL_TABLET | Freq: Every day | ORAL | 5 refills | Status: DC
Start: 2023-03-05 — End: 2023-04-04

## 2023-03-05 MED ORDER — LEVALBUTEROL HCL 1.25 MG/3ML IN NEBU: 1.2500 mg | INHALATION_SOLUTION | RESPIRATORY_TRACT | 5 refills | Status: DC | PRN

## 2023-03-05 NOTE — Progress Notes (Unsigned)
NEW PATIENT  Date of Service/Encounter:  03/05/23  Consult requested by: Anabel Halon, MD   Assessment:   Seasonal and perennial allergic rhinitis - Plan: Allergy Test, Intradermal Allergy Test  Chronic obstructive pulmonary disease, unspecified COPD type (HCC) - Plan: Spirometry with Graph  Plan/Recommendations:    Patient Instructions  1. Seasonal and perennial allergic rhinitis - Testing today showed: grasses, indoor molds, outdoor molds, and dust mites (dust mites was the biggest one) - Copy of test results provided.  - Avoidance measures provided. - Stop taking: your current medications - Start taking: Singulair (montelukast) 10mg  daily and carbinoxamine 4 mg three times daily (can be over drying, so decrease the dose if this is happening) - You can change from carbinoxamine to some other antihistamine every 2-3 months to maintain efficacy.  - You can use an extra dose of the antihistamine, if needed, for breakthrough symptoms.  - Consider nasal saline rinses 1-2 times daily to remove allergens from the nasal cavities as well as help with mucous clearance (this is especially helpful to do before the nasal sprays are given) - Consider allergy shots as a means of long-term control. - Allergy shots "re-train" and "reset" the immune system to ignore environmental allergens and decrease the resulting immune response to those allergens (sneezing, itchy watery eyes, runny nose, nasal congestion, etc).    - Allergy shots improve symptoms in 75-85% of patients.  - We can discuss more at the next appointment if the medications are not working for you.  2. Chronic obstructive pulmonary disease, unspecified COPD type (HCC) - Lung testing as low and slightly improved with the Xopenex treatment. - I think seeing Pulmonology could be help to more effectively manage your breathing. - We could consider adding on Dupixent for better control of your breathing and your sinus disease. -  Information on Dupixent provided.  3. Return in about 4 weeks (around 04/02/2023). You can have the follow up appointment with Dr. Dellis Anes or a Nurse Practicioner (our Nurse Practitioners are excellent and always have Physician oversight!).    Please inform us of any Emergency Department visits, hospitalizations, or changes in symptoms. Call us before going to the ED for breathing or allergy symptoms since we might be able to fit you in for a sick visit. Feel free to contact us anytime with any questions, problems, or concerns.  It was a pleasure to meet you and your bride today! Thank you for your service!   Websites that have reliable patient information: 1. American Academy of Asthma, Allergy, and Immunology: www.aaaai.org 2. Food Allergy Research and Education (FARE): foodallergy.org 3. Mothers of Asthmatics: http://www.asthmacommunitynetwork.org 4. American College of Allergy, Asthma, and Immunology: www.acaai.org   COVID-19 Vaccine Information can be found at: [email or web address] For questions related to vaccine distribution or appointments, please email [email or web address] or call [phone].   We realize that you might be concerned about having an allergic reaction to the COVID19 vaccines. To help with that concern, WE ARE OFFERING THE COVID19 VACCINES IN OUR OFFICE! Ask the front desk for dates!     "Like" [place] on Facebook and Instagram for our latest updates!      A healthy democracy works best when [employer] participate! Make sure you are registered to vote! If you have moved or changed any of your contact information, you will need to get this updated before voting!  In some cases, you MAY be able to register to vote online: [email or web address]       Airborne  Adult Perc - 03/05/23 1443     Time Antigen Placed 1443    Allergen Manufacturer Waynette Buttery    Location Back    Number of  Test 55    1. Control-Buffer 50% Glycerol Negative    2. Control-Histamine 2+    3. Bahia Negative    4. French Southern Territories Negative    5. Johnson Negative    6. Kentucky Blue Negative    7. Meadow Fescue Negative    8. Perennial Rye Negative    9. Timothy Negative    10. Ragweed Mix Negative    11. Cocklebur Negative    12. Plantain,  English Negative    13. Baccharis Negative    14. Dog Fennel Negative    15. Russian Thistle Negative    16. Lamb's Quarters Negative    17. Sheep Sorrell Negative    18. Rough Pigweed Negative    19. Marsh Elder, Rough Negative    20. Mugwort, Common Negative    21. Box, Elder Negative    22. Cedar, red Negative    23. Sweet Gum Negative    24. Pecan Pollen Negative    25. Pine Mix Negative    26. Walnut, Black Pollen Negative    27. Red Mulberry Negative    28. Ash Mix Negative    29. Birch Mix Negative    30. Beech American Negative    31. Cottonwood, Guinea-[place]issau Negative    32. Hickory, White Negative    33. Maple Mix Negative    34. Oak, [place] Mix Negative    35. Sycamore Eastern Negative    36. Alternaria Alternata Negative    37. Cladosporium Herbarum Negative    38. Aspergillus Mix Negative    39. Penicillium Mix Negative    40. Bipolaris Sorokiniana (Helminthosporium) Negative    41. Drechslera Spicifera (Curvularia) Negative    42. Mucor Plumbeus Negative    43. Fusarium Moniliforme Negative    44. Aureobasidium Pullulans (pullulara) Negative    45. Rhizopus Oryzae Negative    46. Botrytis Cinera Negative    47. Epicoccum Nigrum Negative    48. Phoma Betae Negative    49. Dust Mite Mix Negative    50. Cat Hair 10,000 BAU/ml Negative    51.  Dog Epithelia Negative    52. Mixed Feathers Negative    53. Horse Epithelia Negative    54. Cockroach, German Negative    55. Tobacco Leaf Negative             Intradermal - 03/05/23 1501     Time Antigen Placed 1500    Allergen Manufacturer Waynette Buttery    Location Arm    Number of Test 16     Intradermal Select    Control Negative    Bahia 2+    French [place] Territ[place] 2+    Johnson Negative    7 Grass Negative    Ragweed Mix Negative    Weed Mix Negative    Tree Mix Negative    Mold 1 Negative    Mold 2 Negative    Mold 3 3+    Mold 4 3+    Mite Mix 4+    Cat Negative    Dog Negative    Cockroach Negative             Reducing Pollen Exposure  The American Academy of Allergy, Asthma and Immunology suggests the following steps to reduce your exposure to pollen during allergy seasons.    Do  not hang sheets or clothing out to dry; pollen may collect on these items. Do not mow lawns or spend time around freshly cut grass; mowing stirs up pollen. Keep windows closed at night.  Keep car windows closed while driving. Minimize morning activities outdoors, a time when pollen counts are usually at their highest. Stay indoors as much as possible when pollen counts or humidity is high and on windy days when pollen tends to remain in the air longer. Use air conditioning when possible.  Many air conditioners have filters that trap the pollen spores. Use a HEPA room air filter to remove pollen form the indoor air you breathe.  Control of Mold Allergen   Mold and fungi can grow on a variety of surfaces provided certain temperature and moisture conditions exist.  Outdoor molds grow on plants, decaying vegetation and soil.  The major outdoor mold, Alternaria and Cladosporium, are found in very high numbers during hot and dry conditions.  Generally, a late Summer - Fall peak is seen for common outdoor fungal spores.  Rain will temporarily lower outdoor mold spore count, but counts rise rapidly when the rainy period ends.  The most important indoor molds are Aspergillus and Penicillium.  Dark, humid and poorly ventilated basements are ideal sites for mold growth.  The next most common sites of mold growth are the bathroom and the kitchen.  Outdoor (Seasonal) Mold Control  Positive outdoor molds  via skin testing: Bipolaris (Helminthsporium), Drechslera (Curvalaria), and Mucor  Use air conditioning and keep windows closed Avoid exposure to decaying vegetation. Avoid leaf raking. Avoid grain handling. Consider wearing a face mask if working in moldy areas.    Indoor (Perennial) Mold Control   Positive indoor molds via skin testing: Fusarium, Aureobasidium (Pullulara), and Rhizopus  Maintain humidity below 50%. Clean washable surfaces with 5% bleach solution. Remove sources e.g. contaminated carpets.    Control of Dust Mite Allergen    Dust mites play a major role in allergic asthma and rhinitis.  They occur in environments with high humidity wherever human skin is found.  Dust mites absorb humidity from the atmosphere (ie, they do not drink) and feed on organic matter (including shed human and animal skin).  Dust mites are a microscopic type of insect that you cannot see with the naked eye.  High levels of dust mites have been detected from mattresses, pillows, carpets, upholstered furniture, bed covers, clothes, soft toys and any woven material.  The principal allergen of the dust mite is found in its feces.  A gram of dust may contain 1,000 mites and 250,000 fecal particles.  Mite antigen is easily measured in the air during house cleaning activities.  Dust mites do not bite and do not cause harm to humans, other than by triggering allergies/asthma.    Ways to decrease your exposure to dust mites in your home:  Encase mattresses, box springs and pillows with a mite-impermeable barrier or cover   Wash sheets, blankets and drapes weekly in hot water (130 F) with detergent and dry them in a dryer on the hot setting.  Have the room cleaned frequently with a vacuum cleaner and a damp dust-mop.  For carpeting or rugs, vacuuming with a vacuum cleaner equipped with a high-efficiency particulate air (HEPA) filter.  The dust mite allergic individual should not be in a room which is being  cleaned and should wait 1 hour after cleaning before going into the room. Do not sleep on upholstered furniture (eg, couches).  If possible removing carpeting, upholstered furniture and drapery from the home is ideal.  Horizontal blinds should be eliminated in the rooms where the person spends the most time (bedroom, study, television room).  Washable vinyl, roller-type shades are optimal. Remove all non-washable stuffed toys from the bedroom.  Wash stuffed toys weekly like sheets and blankets above.   Reduce indoor humidity to less than 50%.  Inexpensive humidity monitors can be purchased at most hardware stores.  Do not use a humidifier as can make the problem worse and are not recommended.         {Blank single:19197::"This note in its entirety was forwarded to the Provider who requested this consultation."}  Subjective:   Benjamin [name] is a 82 y.o. male presenting today for evaluation of  Chief Complaint  Patient presents with   Allergic Rhinitis     Sinus issues, cough, post nasal drip, has digestive issues     Benjamin [name] has a history of the following: Patient Active Problem List   Diagnosis Date Noted   Chronic sinusitis 01/31/2023   Bronchiectasis 01/31/2023   Chronic respiratory failure with hypoxia (HCC) 12/31/2022   Encounter for general adult medical examination with abnormal findings 11/22/2022   Gynecomastia 05/22/2022   Bilateral impacted cerumen 05/22/2022   Nipple pain 03/12/2022   Hospital discharge follow-up 10/30/2021   History of adenomatous polyp of colon 10/30/2021   History of duodenal ulcer 10/30/2021   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   Grade II hemorrhoids 10/23/2021   Rectal bleeding 10/23/2021   Hyperlipidemia LDL goal <70 10/18/2021   Muscle weakness (generalized) 10/16/2021   Right inguinal hernia    Hernia, umbilical 04/13/2021   Thrombocytopenia (HCC) 01/23/2020   Chronic allergic rhinitis 12/21/2019   Chronic pain syndrome 12/21/2019    Chronic insomnia 12/20/2019   AK (actinic keratosis) 03/17/2019   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   CAD (coronary artery disease) 02/05/2019   Aortic atherosclerosis (HCC) 09/22/2018   Prediabetes 09/22/2018   Chronic diastolic CHF (congestive heart failure) (HCC)    Irritable bowel syndrome with diarrhea    Chronic bilateral thoracic back pain 02/12/2017   GERD (gastroesophageal reflux disease) 11/27/2016   Constipation 10/28/2016   Erectile dysfunction 06/25/2016   COPD (chronic obstructive pulmonary disease) (HCC) 10/20/2015   DDD (degenerative disc disease), lumbar 01/11/2014   Acute recurrent frontal sinusitis 06/16/2013   BPH (benign prostatic hyperplasia) 03/09/2012    History obtained from: chart review and patient and his wife .  Takumi Sowerby was referred by Anabel Halon, MD.     Benjamin [name] is a 82 y.o. male presenting for an evaluation of congestion . Everything seems to have gotten worse since they got COVID in 2023. He was admitted to the hospital on Junyuary 25th, 2023. He was then discharged to the nursing home and he was sent to the hospital multiple times during that time. He thinks that he has had COVID in the past.  t   Asthma/Respiratory Symptom History: He has COPD per the patient. HE did have a quadruple bypass and he was breathing fine after that. His stamina is very low.  He was on a Spiriva but he switched to the nebulizer. He does not use it regularly enough.   He did see Dr. Sherene Sires.   He is looking into getting an air purifier that releases a probiotic aerosolized.   Allergic Rhinitis Symptom History: He has congestion year round. It has not been helped with nose sprays. He  is not a huge fan of this. He has Flonase but he does not use it routinely. He has saline but it does not help. He was on Allegra-D at some point. He was told to stop the D so he changed to Allegra alone. He is on Xyzal 5mg  daily which he alternates with the Allegra.  The COVID  infection seems to have triggered his symptoms. He was allergy tested when he was a kid.   {Blank single:19197::"Food Allergy Symptom History: ***"," "}  {Blank single:19197::"Skin Symptom History: ***"," "}  GERD Symptom History: He is on Protonix   ***Otherwise, there is no history of other atopic diseases, including {Blank multiple:19196:o:"asthma","food allergies","drug allergies","environmental allergies","stinging insect allergies","eczema","urticaria","contact dermatitis"}. There is no significant infectious history. ***Vaccinations are up to date.    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   Grade II hemorrhoids 10/23/2021   Rectal bleeding 10/23/2021   Hyperlipidemia LDL goal <70 10/18/2021   Muscle weakness (generalized) 10/16/2021   Right inguinal hernia    Hernia, umbilical 04/13/2021   Thrombocytopenia (HCC) 01/23/2020   Chronic allergic rhinitis 12/21/2019   Chronic pain syndrome 12/21/2019   Chronic insomnia 12/20/2019   AK (actinic keratosis) 03/17/2019   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   CAD (coronary artery disease) 02/05/2019   Aortic atherosclerosis (HCC) 09/22/2018   Prediabetes 09/22/2018   Chronic diastolic CHF (congestive heart failure) (HCC)    Irritable bowel syndrome with diarrhea    Chronic bilateral thoracic back pain 02/12/2017   GERD (gastroesophageal reflux disease) 11/27/2016   Constipation 10/28/2016   Erectile dysfunction 06/25/2016    COPD (chronic obstructive pulmonary disease) (HCC) 10/20/2015   DDD (degenerative disc disease), lumbar 01/11/2014   Acute recurrent frontal sinusitis 06/16/2013   BPH (benign prostatic hyperplasia) 03/09/2012    Medication List:  Allergies as of 03/05/2023       Reactions   Arsenic Swelling   Severe swelling if patient comes in contact    Contrast Media [iodinated Contrast Media] Swelling, Rash   Nitrofurantoin Monohyd Macro Itching   Completed the full course while taking Benadryl for    Statins Rash   Joint pain        Medication List        Accurate as of March 05, 2023  4:32 PM. If you have any questions, ask your nurse or doctor.          STOP taking these medications    HYDROcodone-acetaminophen 5-325 MG tablet Commonly known as: NORCO/VICODIN Stopped by: Alfonse Spruce, MD   Tiotropium Bromide-Olodaterol 2.5-2.5 MCG/ACT Aers Stopped by: Alfonse Spruce, MD       TAKE these medications    acetaminophen 500 MG tablet Commonly known as: TYLENOL Take 1,000 mg by mouth every 8 (eight) hours as needed for mild pain.   albuterol 108 (90 Base) MCG/ACT inhaler Commonly known as: VENTOLIN HFA Inhale 1 puff into the lungs every 6 (six) hours as needed for wheezing or shortness of breath.   ALLEGRA-D 24 HOUR PO Take by  mouth as needed.   Carbinoxamine Maleate 4 MG Tabs Take 1 tablet (4 mg total) by mouth 3 (three) times daily. (can be over drying, so decrease the dose if this is happening) Started by: Alfonse Spruce, MD   colestipol 1 g tablet Commonly known as: COLESTID Take 2 g by mouth 2 (two) times daily.   Cranberry 400 MG Tabs Take 4 tablets by mouth 2 (two) times daily.   finasteride 5 MG tablet Commonly known as: Proscar Take 1 tablet (5 mg total) by mouth daily.   ipratropium-albuterol 0.5-2.5 (3) MG/3ML Soln Commonly known as: DUONEB Take 3 mLs by nebulization 4 times daily and as needed for shortness of breath or  wheezing.   metoprolol tartrate 25 MG tablet Commonly known as: LOPRESSOR Take 1/2 (one-half) tablet by mouth twice daily   montelukast 10 MG tablet Commonly known as: Singulair Take 1 tablet (10 mg total) by mouth at bedtime. Started by: Alfonse Spruce, MD   pantoprazole 40 MG tablet Commonly known as: PROTONIX Take 1 tablet (40 mg total) by mouth 2 (two) times daily.   Repatha SureClick 140 MG/ML Soaj Generic drug: Evolocumab INJECT 140MG  SUBCUTANEOUSLY EVERY 14 DAYS AS DIRECTED BY MD   sildenafil 20 MG tablet Commonly known as: REVATIO Take 1 tablet (20 mg total) by mouth daily. Take 1-5 tablets as needed prior to sexual activity   tamsulosin 0.4 MG Caps capsule Commonly known as: FLOMAX TAKE 2 CAPSULES BY MOUTH ONCE DAILY AFTER SUPPER FOR PROSTATE        Birth History: {Blank single:19197::"non-contributory","born premature and spent time in the NICU","born at term without complications"}  Developmental History: Renso has met all milestones on time. He has required no {Blank multiple:19196:a:"speech therapy","occupational therapy","physical therapy"}. ***non-contributory  Past Surgical History: Past Surgical History:  Procedure Laterality Date   APPENDECTOMY     age 16   BIOPSY  01/19/2016   Procedure: BIOPSY;  Surgeon: West Bali, MD;  [facility]: [facility];  Service: Endoscopy;;   Gastric biopsies   CATARACT EXTRACTION W/PHACO  07/27/2012   Procedure: CATARACT EXTRACTION PHACO AND INTRAOCULAR LENS PLACEMENT (IOC);  Surgeon: Gemma Payor, MD;  Location: [facility];  Service: Ophthalmology;  Laterality: Right;  CDE: 12.55   CATARACT EXTRACTION W/PHACO Left 01/08/2016   Procedure: CATARACT EXTRACTION PHACO AND INTRAOCULAR LENS PLACEMENT (IOC);  Surgeon: Gemma Payor, MD;  Location: [facility];  Service: Ophthalmology;  Laterality: Left;  CDE: 13.51   COLONOSCOPY N/A 01/19/2016   Dr. Darrick Penna: 10 mm tubular adenoma transverse colon, hyperplastic 6 mm polyp, 3 year  surveillance   CORONARY ARTERY BYPASS GRAFT N/A 03/01/2019   Procedure: CORONARY ARTERY BYPASS GRAFTING (CABG) x 4, ON PUMP, USING LEFT INTERNAL MAMMARY ARTERY AND RIGHT GREATER SAPHENOUS VEIN HARVESTED ENDOSCOPICALLY;  Surgeon: Alleen Borne, MD;  Location: [facility];  Service: Open Heart Surgery;  Laterality: N/A;   ELBOW SURGERY Left    1990s;  per pt repair crush injury ,  no hardware   ESOPHAGOGASTRODUODENOSCOPY N/A 01/19/2016   Dr. Darrick Penna: Grade B esophagitis, esophageal stenosis/esophagitis, gastritis, duodenitis, multiple non-bleeding duodenal ulcers, recommended gastrin level. Negative H.pylori    EYE SURGERY Bilateral 11/17/2020   in [place];   bilatearl upper eyelid repair and right lower eyelid repair   FINGER TENDON REPAIR Left 2012   left thumb   INGUINAL HERNIA REPAIR Right 05/07/2021   Procedure: HERNIA REPAIR INGUINAL ADULT;  Surgeon: Franky Macho, MD;  Location: [facility];  Service: General;  Laterality: Right;  KNEE ARTHROSCOPY Left    2004  and 2012   LEFT HEART CATH AND CORONARY ANGIOGRAPHY N/A 02/19/2019   Procedure: LEFT HEART CATH AND CORONARY ANGIOGRAPHY;  Surgeon: Lyn Records, MD;  Location: [facility];  Service: Cardiovascular;  Laterality: N/A;   POLYPECTOMY  01/19/2016   Procedure: POLYPECTOMY;  Surgeon: West Bali, MD;  [facility];  Service: Endoscopy;;  Distal transverse colon polyp and Recto-sigmoid colonpolyp  removed via hot snare   SHOULDER ARTHROSCOPY WITH DISTAL CLAVICLE RESECTION Left 2003   w/ acromioplasty   STERNAL WIRES REMOVAL N/A 06/24/2019   Procedure: STERNAL WIRES REMOVAL;  Surgeon: Alleen Borne, MD;  Location: [facility];  Service: Thoracic;  Laterality: N/A;   TEE WITHOUT CARDIOVERSION N/A 03/01/2019   Procedure: TRANSESOPHAGEAL ECHOCARDIOGRAM (TEE);  Surgeon: Alleen Borne, MD;  Location[facility] OR;  Service: Open Heart Surgery;  Laterality: N/A;   THULIUM LASER TURP (TRANSURETHRAL RESECTION OF PROSTATE) N/A 07/19/2022    Procedure: THULIUM LASER TURP (TRANSURETHRAL RESECTION OF PROSTATE);  Surgeon: Jerilee Field, MD;  [facility];  Service: Urology;  Laterality: N/A;   UMBILICAL HERNIA REPAIR N/A 05/07/2021   Procedure: HERNIA REPAIR UMBILICAL ADULT;  Surgeon: Franky Macho, MD;  Location: [facility];  Service: General;  Laterality: N/A;     Family History: Family History  Problem Relation Age of Onset   Diabetes Mother    COPD Mother    Heart disease Mother    Colon cancer Neg Hx    Allergic rhinitis Neg Hx    Angioedema Neg Hx    Asthma Neg Hx    Eczema Neg Hx    Urticaria Neg Hx      Social History: Naod lives at home with ***.  Lives in a house that is 81 years old.  There is 1 in the main living areas of the month in the bedroom.  He has a combination of dust electric heating and central cooling.  There is a dog inside and outside of the home.  There are no dust mite covers on the bedding.  There is no tobacco exposure.  They do use a HEPA filter.  There is no fume, chemical, or dust exposure.  They do not live near any food or industrial area.  There is no   He was in [employer] for a "long time". 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    ROS     Objective:   Blood pressure 112/72, pulse 96, temperature 98.7 F (37.1 C), resp. rate 18, height 5' 9.69" (1.77 m), weight 192 lb (87.1 kg), SpO2 91 %. Body mass index is 27.8 kg/m.     Physical Exam   Diagnostic studies:    Spirometry: results abnormal (FEV1: 1.58/57%, FVC: 2.13/57%, FEV1/FVC: 74%).    Spirometry consistent with possible restrictive disease. Xopenex four puffs via MDI treatment given in clinic with no improvement.  Allergy Studies: {Blank single:19197::"none","labs sent instead"," "}   Airborne Adult Perc - 03/05/23 1443     Time Antigen Placed 1443    Allergen Manufacturer Waynette Buttery    Location Back    Number of Test 55    1. Control-Buffer 50% Glycerol Negative    2.  Control-Histamine 2+    3. Bahia Negative    4. [place] Territ[place] Negative    5. Johnson Negative    6. Kentucky Blue Negative    7. Meadow Fescue Negative    8. Perennial Rye Negative  9. Timothy Negative    10. Ragweed Mix Negative    11. Cocklebur Negative    12. Plantain,  English Negative    13. Baccharis Negative    14. Dog Fennel Negative    15. Russian Thistle Negative    16. Lamb's Quarters Negative    17. Sheep Sorrell Negative    18. Rough Pigweed Negative    19. Marsh Elder, Rough Negative    20. Mugwort, Common Negative    21. Box, Elder Negative    22. Cedar, red Negative    23. Sweet Gum Negative    24. Pecan Pollen Negative    25. Pine Mix Negative    26. Walnut, Black Pollen Negative    27. Red Mulberry Negative    28. Ash Mix Negative    29. Birch Mix Negative    30. Beech American Negative    31. Cottonwood, [place] Negative    32. Hickory, White Negative    33. Maple Mix Negative    34. Oak, [place] Mix Negative    35. Sycamore Eastern Negative    36. Alternaria Alternata Negative    37. Cladosporium Herbarum Negative    38. Aspergillus Mix Negative    39. Penicillium Mix Negative    40. Bipolaris Sorokiniana (Helminthosporium) Negative    41. Drechslera Spicifera (Curvularia) Negative    42. Mucor Plumbeus Negative    43. Fusarium Moniliforme Negative    44. Aureobasidium Pullulans (pullulara) Negative    45. Rhizopus Oryzae Negative    46. Botrytis Cinera Negative    47. Epicoccum Nigrum Negative    48. Phoma Betae Negative    49. Dust Mite Mix Negative    50. Cat Hair 10,000 BAU/ml Negative    51.  Dog Epithelia Negative    52. Mixed Feathers Negative    53. Horse Epithelia Negative    54. Cockroach, German Negative    55. Tobacco Leaf Negative             Intradermal - 03/05/23 1501     Time Antigen Placed 1500    Allergen Manufacturer Waynette Buttery    Location Arm    Number of Test 16    Intradermal Select    Control Negative    Bahia 2+     [place] 2+    Johnson Negative    7 Grass Negative    Ragweed Mix Negative    Weed Mix Negative    Tree Mix Negative    Mold 1 Negative    Mold 2 Negative    Mold 3 3+    Mold 4 3+    Mite Mix 4+    Cat Negative    Dog Negative    Cockroach Negative             {Blank single:19197::"Allergy testing results were read and interpreted by myself, documented by clinical staff."," "}         Malachi Bonds, MD [facility]

## 2023-03-05 NOTE — Patient Instructions (Addendum)
1. Seasonal and perennial allergic rhinitis - Testing today showed: grasses, indoor molds, outdoor molds, and dust mites (dust mites was the biggest one) - Copy of test results provided.  - Avoidance measures provided. - Stop taking: your current medications - Start taking: Singulair (montelukast) 10mg  daily and carbinoxamine 4 mg three times daily (can be over drying, so decrease the dose if this is happening) - You can change from carbinoxamine to some other antihistamine every 2-3 months to maintain efficacy.  - You can use an extra dose of the antihistamine, if needed, for breakthrough symptoms.  - Consider nasal saline rinses 1-2 times daily to remove allergens from the nasal cavities as well as help with mucous clearance (this is especially helpful to do before the nasal sprays are given) - Consider allergy shots as a means of long-term control. - Allergy shots "re-train" and "reset" the immune system to ignore environmental allergens and decrease the resulting immune response to those allergens (sneezing, itchy watery eyes, runny nose, nasal congestion, etc).    - Allergy shots improve symptoms in 75-85% of patients.  - We can discuss more at the next appointment if the medications are not working for you.  2. Chronic obstructive pulmonary disease, unspecified COPD type (HCC) - Lung testing as low and slightly improved with the Xopenex treatment. - I think seeing Pulmonology could be help to more effectively manage your breathing. - We could consider adding on Dupixent for better control of your breathing and your sinus disease. - Information on Dupixent provided.  3. Return in about 4 weeks (around 04/02/2023). You can have the follow up appointment with Dr. Dellis Anes or a Nurse Practicioner (our Nurse Practitioners are excellent and always have Physician oversight!).    Please inform us of any Emergency Department visits, hospitalizations, or changes in symptoms. Call us before going  to the ED for breathing or allergy symptoms since we might be able to fit you in for a sick visit. Feel free to contact us anytime with any questions, problems, or concerns.  It was a pleasure to meet you and your bride today! Thank you for your service!   Websites that have reliable patient information: 1. American Academy of Asthma, Allergy, and Immunology: www.aaaai.org 2. Food Allergy Research and Education (FARE): foodallergy.org 3. Mothers of Asthmatics: http://www.asthmacommunitynetwork.org 4. American College of Allergy, Asthma, and Immunology: www.acaai.org   COVID-19 Vaccine Information can be found at: [email or web address] For questions related to vaccine distribution or appointments, please email [email or web address] or call [phone].   We realize that you might be concerned about having an allergic reaction to the COVID19 vaccines. To help with that concern, WE ARE OFFERING THE COVID19 VACCINES IN OUR OFFICE! Ask the front desk for dates!     "Like" [place] on Facebook and Instagram for our latest updates!      A healthy democracy works best when [employer] participate! Make sure you are registered to vote! If you have moved or changed any of your contact information, you will need to get this updated before voting!  In some cases, you MAY be able to register to vote online: [email or web address]       Airborne Adult Perc - 03/05/23 1443     Time Antigen Placed 1443    Allergen Manufacturer Waynette Buttery    Location Back    Number of Test 55    1. Control-Buffer 50% Glycerol Negative    2. Control-Histamine 2+    3. Bahia Negative    4. [place]  Negative    5. Johnson Negative    6. Kentucky Blue Negative    7. Meadow Fescue Negative    8. Perennial Rye Negative    9. Timothy Negative    10. Ragweed Mix Negative    11. Cocklebur Negative    12. Plantain,  English Negative    13.  Baccharis Negative    14. Dog Fennel Negative    15. Russian Thistle Negative    16. Lamb's Quarters Negative    17. Sheep Sorrell Negative    18. Rough Pigweed Negative    19. Marsh Elder, Rough Negative    20. Mugwort, Common Negative    21. Box, Elder Negative    22. Cedar, red Negative    23. Sweet Gum Negative    24. Pecan Pollen Negative    25. Pine Mix Negative    26. Walnut, Black Pollen Negative    27. Red Mulberry Negative    28. Ash Mix Negative    29. Birch Mix Negative    30. Beech American Negative    31. Cottonwood, Guinea[place]issau Negative    32. Hickory, White Negative    33. Maple Mix Negative    34. Oak, [place] Mix Negative    35. Sycamore Eastern Negative    36. Alternaria Alternata Negative    37. Cladosporium Herbarum Negative    38. Aspergillus Mix Negative    39. Penicillium Mix Negative    40. Bipolaris Sorokiniana (Helminthosporium) Negative    41. Drechslera Spicifera (Curvularia) Negative    42. Mucor Plumbeus Negative    43. Fusarium Moniliforme Negative    44. Aureobasidium Pullulans (pullulara) Negative    45. Rhizopus Oryzae Negative    46. Botrytis Cinera Negative    47. Epicoccum Nigrum Negative    48. Phoma Betae Negative    49. Dust Mite Mix Negative    50. Cat Hair 10,000 BAU/ml Negative    51.  Dog Epithelia Negative    52. Mixed Feathers Negative    53. Horse Epithelia Negative    54. Cockroach, German Negative    55. Tobacco Leaf Negative             Intradermal - 03/05/23 1501     Time Antigen Placed 1500    Allergen Manufacturer Waynette Buttery    Location Arm    Number of Test 16    Intradermal Select    Control Negative    Bahia 2+    [place] 2+    Johnson Negative    7 Grass Negative    Ragweed Mix Negative    Weed Mix Negative    Tree Mix Negative    Mold 1 Negative    Mold 2 Negative    Mold 3 3+    Mold 4 3+    Mite Mix 4+    Cat Negative    Dog Negative    Cockroach Negative             Reducing Pollen  Exposure  The American Academy of Allergy, Asthma and Immunology suggests the following steps to reduce your exposure to pollen during allergy seasons.    Do not hang sheets or clothing out to dry; pollen may collect on these items. Do not mow lawns or spend time around freshly cut grass; mowing stirs up pollen. Keep windows closed at night.  Keep car windows closed while driving. Minimize morning activities outdoors, a time when pollen counts are usually at their highest. Stay  indoors as much as possible when pollen counts or humidity is high and on windy days when pollen tends to remain in the air longer. Use air conditioning when possible.  Many air conditioners have filters that trap the pollen spores. Use a HEPA room air filter to remove pollen form the indoor air you breathe.  Control of Mold Allergen   Mold and fungi can grow on a variety of surfaces provided certain temperature and moisture conditions exist.  Outdoor molds grow on plants, decaying vegetation and soil.  The major outdoor mold, Alternaria and Cladosporium, are found in very high numbers during hot and dry conditions.  Generally, a late Summer - Fall peak is seen for common outdoor fungal spores.  Rain will temporarily lower outdoor mold spore count, but counts rise rapidly when the rainy period ends.  The most important indoor molds are Aspergillus and Penicillium.  Dark, humid and poorly ventilated basements are ideal sites for mold growth.  The next most common sites of mold growth are the bathroom and the kitchen.  Outdoor (Seasonal) Mold Control  Positive outdoor molds via skin testing: Bipolaris (Helminthsporium), Drechslera (Curvalaria), and Mucor  Use air conditioning and keep windows closed Avoid exposure to decaying vegetation. Avoid leaf raking. Avoid grain handling. Consider wearing a face mask if working in moldy areas.    Indoor (Perennial) Mold Control   Positive indoor molds via skin testing:  Fusarium, Aureobasidium (Pullulara), and Rhizopus  Maintain humidity below 50%. Clean washable surfaces with 5% bleach solution. Remove sources e.g. contaminated carpets.    Control of Dust Mite Allergen    Dust mites play a major role in allergic asthma and rhinitis.  They occur in environments with high humidity wherever human skin is found.  Dust mites absorb humidity from the atmosphere (ie, they do not drink) and feed on organic matter (including shed human and animal skin).  Dust mites are a microscopic type of insect that you cannot see with the naked eye.  High levels of dust mites have been detected from mattresses, pillows, carpets, upholstered furniture, bed covers, clothes, soft toys and any woven material.  The principal allergen of the dust mite is found in its feces.  A gram of dust may contain 1,000 mites and 250,000 fecal particles.  Mite antigen is easily measured in the air during house cleaning activities.  Dust mites do not bite and do not cause harm to humans, other than by triggering allergies/asthma.    Ways to decrease your exposure to dust mites in your home:  Encase mattresses, box springs and pillows with a mite-impermeable barrier or cover   Wash sheets, blankets and drapes weekly in hot water (130 F) with detergent and dry them in a dryer on the hot setting.  Have the room cleaned frequently with a vacuum cleaner and a damp dust-mop.  For carpeting or rugs, vacuuming with a vacuum cleaner equipped with a high-efficiency particulate air (HEPA) filter.  The dust mite allergic individual should not be in a room which is being cleaned and should wait 1 hour after cleaning before going into the room. Do not sleep on upholstered furniture (eg, couches).   If possible removing carpeting, upholstered furniture and drapery from the home is ideal.  Horizontal blinds should be eliminated in the rooms where the person spends the most time (bedroom, study, television room).   Washable vinyl, roller-type shades are optimal. Remove all non-washable stuffed toys from the bedroom.  Wash stuffed toys weekly like sheets  and blankets above.   Reduce indoor humidity to less than 50%.  Inexpensive humidity monitors can be purchased at most hardware stores.  Do not use a humidifier as can make the problem worse and are not recommended.

## 2023-03-07 ENCOUNTER — Encounter: Payer: Self-pay | Admitting: Internal Medicine

## 2023-03-07 ENCOUNTER — Ambulatory Visit: Payer: Medicare PPO | Attending: Internal Medicine | Admitting: Internal Medicine

## 2023-03-07 VITALS — BP 96/52 | HR 77 | Ht 70.5 in | Wt 193.0 lb

## 2023-03-07 DIAGNOSIS — T466X5D Adverse effect of antihyperlipidemic and antiarteriosclerotic drugs, subsequent encounter: Secondary | ICD-10-CM | POA: Diagnosis not present

## 2023-03-07 DIAGNOSIS — R0602 Shortness of breath: Secondary | ICD-10-CM

## 2023-03-07 DIAGNOSIS — E785 Hyperlipidemia, unspecified: Secondary | ICD-10-CM

## 2023-03-07 DIAGNOSIS — I25119 Atherosclerotic heart disease of native coronary artery with unspecified angina pectoris: Secondary | ICD-10-CM | POA: Diagnosis not present

## 2023-03-07 DIAGNOSIS — M791 Myalgia, unspecified site: Secondary | ICD-10-CM

## 2023-03-07 DIAGNOSIS — T466X5A Adverse effect of antihyperlipidemic and antiarteriosclerotic drugs, initial encounter: Secondary | ICD-10-CM
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
## 2023-03-07 NOTE — Patient Instructions (Signed)
Medication Instructions:  NO CHANGES  *If you need a refill on your cardiac medications before your next appointment, please call your pharmacy*   Lab Work: FASTING lab work in 1 year  If you have labs (blood work) drawn today and your tests are completely normal, you will receive your results only by: MyChart Message (if you have MyChart) OR A paper copy in the mail If you have any lab test that is abnormal or we need to change your treatment, we will call you to review the results.    Follow-Up: At [facility], you and your health needs are our priority.  As part of our continuing mission to provide you with exceptional heart care, we have created designated Provider Care Teams.  These Care Teams include your primary Cardiologist (physician) and Advanced Practice Providers (APPs -  Physician Assistants and Nurse Practitioners) who all work together to provide you with the care you need, when you need it.  We recommend signing up for the patient portal called "MyChart".  Sign up information is provided on this After Visit Summary.  MyChart is used to connect with patients for Virtual Visits (Telemedicine).  Patients are able to view lab/test results, encounter notes, upcoming appointments, etc.  Non-urgent messages can be sent to your provider as well.   To learn more about what you can do with MyChart, go to [email or web address].    Your next appointment:    12 months with Dr. Hilty  

## 2023-03-16 DIAGNOSIS — J479 Bronchiectasis, uncomplicated: Secondary | ICD-10-CM | POA: Diagnosis not present

## 2023-03-19 ENCOUNTER — Ambulatory Visit (INDEPENDENT_AMBULATORY_CARE_PROVIDER_SITE_OTHER): Payer: Medicare PPO | Admitting: Allergy & Immunology

## 2023-03-19 ENCOUNTER — Encounter: Payer: Self-pay | Admitting: Allergy & Immunology

## 2023-03-19 ENCOUNTER — Other Ambulatory Visit: Payer: Self-pay

## 2023-03-19 VITALS — BP 104/66 | HR 86 | Temp 98.8°F | Resp 16

## 2023-03-19 DIAGNOSIS — J302 Other seasonal allergic rhinitis: Secondary | ICD-10-CM | POA: Diagnosis not present

## 2023-03-19 DIAGNOSIS — J3089 Other allergic rhinitis: Secondary | ICD-10-CM

## 2023-03-19 DIAGNOSIS — J4489 Other specified chronic obstructive pulmonary disease: Secondary | ICD-10-CM | POA: Diagnosis not present

## 2023-03-19 MED ORDER — METHYLPREDNISOLONE ACETATE 80 MG/ML IJ SUSP
80.0000 mg | Freq: Once | INTRAMUSCULAR | Status: AC
  Administered 2023-03-19: 80 mg via INTRAMUSCULAR

## 2023-03-19 MED ORDER — PREDNISONE 10 MG PO TABS: ORAL_TABLET | ORAL | 0 refills | Status: DC

## 2023-03-19 NOTE — Patient Instructions (Addendum)
1. Seasonal and perennial allergic rhinitis - Previous testing showed: grasses, indoor molds, outdoor molds, and dust mites (dust mites was the biggest one) - Copy of test results provided.  - Avoidance measures provided. - STOP  taking: Singulair (montelukast) 10mg  daily and carbinoxamine 4 mg since you did not think that they helped at all.  - Start taking: Atrovent (ipratropium) one spray per nostril twice daily (can be over drying, so decrease the dose if this is happening) - You can change from carbinoxamine to some other antihistamine every 2-3 months to maintain efficacy.  - You can use an extra dose of the antihistamine, if needed, for breakthrough symptoms.  - Consider nasal saline rinses 1-2 times daily to remove allergens from the nasal cavities as well as help with mucous clearance (this is especially helpful to do before the nasal sprays are given) - Consider allergy shots as a means of long-term control. - Allergy shots "re-train" and "reset" the immune system to ignore environmental allergens and decrease the resulting immune response to those allergens (sneezing, itchy watery eyes, runny nose, nasal congestion, etc).    - Allergy shots improve symptoms in 75-85% of patients.  - We need your breathing under better control before we do anything with allergy shots.   2. Chronic obstructive pulmonary disease - Lung testing was much lower but it did improve with the nebulizer treatment. - We need to get you on a daily inhaler medication. - Start Trelegy one puff once daily. - This contains THREE medicines to help with inflammation as well as keeping your airways relaxed and open. - Sample of Trelegy provided today.  - DepoMedrol injection given today to [facility] your healing process and get you feeling better before your trip.  - Start low dose prednisone taper: Take two tablets (20mg ) twice daily for three days, then one tablet (10mg ) twice daily for three days, then STOP. -  Consider Dupixent as an add on treatment to control your breathing.  - Daily controller medication(s): Trelegy 200/62.5/25 one puff once daily - Rescue medications: Xopenex nebulizer one vial every 4-6 hours as needed - Asthma control goals:  * Full participation in all desired activities (may need albuterol before activity) * Albuterol use two time or less a week on average (not counting use with activity) * Cough interfering with sleep two time or less a month * Oral steroids no more than once a year * No hospitalizations  3. Follow with Thurston Hole on July 19th at 11:20am.  Please inform us of any Emergency Department visits, hospitalizations, or changes in symptoms. Call us before going to the ED for breathing or allergy symptoms since we might be able to fit you in for a sick visit. Feel free to contact us anytime with any questions, problems, or concerns.  It was a pleasure to meet you and your bride today! Thank you for your service!   Websites that have reliable patient information: 1. American Academy of Asthma, Allergy, and Immunology: www.aaaai.org 2. Food Allergy Research and Education (FARE): foodallergy.org 3. Mothers of Asthmatics: http://www.asthmacommunitynetwork.org 4. American College of Allergy, Asthma, and Immunology: www.acaai.org   COVID-19 Vaccine Information can be found at: [email or web address] For questions related to vaccine distribution or appointments, please email [email or web address] or call [phone].   We realize that you might be concerned about having an allergic reaction to the COVID19 vaccines. To help with that concern, WE ARE OFFERING THE COVID19 VACCINES IN OUR OFFICE! Ask the front desk for dates!     "  Like" [place] on Facebook and Instagram for our latest updates!      A healthy democracy works best when [employer] participate! Make sure you are registered to vote! If you have moved or  changed any of your contact information, you will need to get this updated before voting!  In some cases, you MAY be able to register to vote online: [email or web address]

## 2023-03-19 NOTE — Progress Notes (Signed)
FOLLOW UP  Date of Service/Encounter:  03/19/23   Assessment:   Seasonal and perennial allergic rhinitis (grasses, indoor molds, outdoor molds, and dust mites)   Chronic obstructive pulmonary disease - considering addition of Dupixent due to high risk with multiple prednisone tapers   AEC 300 in March 2024    Plan/Recommendations:   1. Seasonal and perennial allergic rhinitis - Previous testing showed: grasses, indoor molds, outdoor molds, and dust mites (dust mites was the biggest one) - Copy of test results provided.  - Avoidance measures provided. - STOP  taking: Singulair (montelukast) 10mg  daily and carbinoxamine 4 mg since you did not think that they helped at all.  - Start taking: Atrovent (ipratropium) one spray per nostril twice daily (can be over drying, so decrease the dose if this is happening) - You can change from carbinoxamine to some other antihistamine every 2-3 months to maintain efficacy.  - You can use an extra dose of the antihistamine, if needed, for breakthrough symptoms.  - Consider nasal saline rinses 1-2 times daily to remove allergens from the nasal cavities as well as help with mucous clearance (this is especially helpful to do before the nasal sprays are given) - Consider allergy shots as a means of long-term control. - Allergy shots "re-train" and "reset" the immune system to ignore environmental allergens and decrease the resulting immune response to those allergens (sneezing, itchy watery eyes, runny nose, nasal congestion, etc).    - Allergy shots improve symptoms in 75-85% of patients.  - We need your breathing under better control before we do anything with allergy shots.   2. Chronic obstructive pulmonary disease - Lung testing was much lower but it did improve with the nebulizer treatment. - We need to get you on a daily inhaler medication. - Start Trelegy one puff once daily. - This contains THREE medicines to help with inflammation as well  as keeping your airways relaxed and open. - Sample of Trelegy provided today.  - DepoMedrol injection given today to [facility] your healing process and get you feeling better before your trip.  - Start low dose prednisone taper: Take two tablets (20mg ) twice daily for three days, then one tablet (10mg ) twice daily for three days, then STOP. - Consider Dupixent as an add on treatment to control your breathing.  - Daily controller medication(s): Trelegy 200/62.5/25 one puff once daily - Rescue medications: Xopenex nebulizer one vial every 4-6 hours as needed - Asthma control goals:  * Full participation in all desired activities (may need albuterol before activity) * Albuterol use two time or less a week on average (not counting use with activity) * Cough interfering with sleep two time or less a month * Oral steroids no more than once a year * No hospitalizations  3. Follow with Thurston Hole on July 19th at 11:20am.  Subjective:   Benjamin [name] is a 82 y.o. male presenting today for follow up of  Chief Complaint  Patient presents with   Follow-up    Having issues with his breathing and coughing. Has been taking carbinoxamine 2x a day. Is still using the xopenex nebulizer. Is achy and is mostly in bed.     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   Grade II hemorrhoids 10/23/2021   Rectal bleeding 10/23/2021   Hyperlipidemia LDL goal <70 10/18/2021   Muscle weakness (generalized)  10/16/2021   Right inguinal hernia    Hernia, umbilical 04/13/2021   Thrombocytopenia (HCC) 01/23/2020   Chronic allergic rhinitis 12/21/2019   Chronic pain syndrome 12/21/2019   Chronic insomnia 12/20/2019   AK (actinic keratosis) 03/17/2019   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   CAD (coronary artery disease) 02/05/2019   Aortic atherosclerosis (HCC) 09/22/2018   Prediabetes 09/22/2018   Chronic diastolic CHF (congestive heart failure) (HCC)    Irritable bowel syndrome with diarrhea    Chronic bilateral thoracic back pain 02/12/2017   GERD (gastroesophageal reflux disease) 11/27/2016   Constipation 10/28/2016   Erectile dysfunction 06/25/2016   COPD (chronic obstructive pulmonary disease) (HCC) 10/20/2015   DDD (degenerative disc disease), lumbar 01/11/2014   Acute recurrent frontal sinusitis 06/16/2013   BPH (benign prostatic hyperplasia) 03/09/2012    History obtained from: chart review and patient and his wife.   Benjamin [name] is a 82 y.o. male presenting for a follow up visit.  He was last seen in June 2024 as a new patient.  At that time, testing was positive to grasses as well as molds and dust mites.  We stopped his medications and started Singulair and carbinoxamine.  For his COPD, he continued to follow with Dr. Sherene Sires.  His lung testing was low and improved only slightly with Xopenex.  We did talk about adding Dupixent for better control of his breathing and sinus disease.  Since last visit, he has not done well.   Asthma/Respiratory Symptom History: He has continued to have a chronic cough. He has been using Robitussin.  He last saw Dr. Sherene Sires. He was a smoker ten years ago. He was on prednisone "not that long ago" by Dr. Allena Katz his PCP. When he is on it, it works. He thinks that his symptoms have overall gotten a lot worse since he saw Dr. Sherene Sires in February 2023. He denies ever having been on any daily controller medication. I list several of the them and he does not  recognize any of them. He is not really particularly interested in seeing Dr. Sherene Sires again. He felt that he shrugged of his and his wife's concerns.    Allergic Rhinitis Symptom History: Rhinitis symptoms have not really changed with the medications that we have provided. He did not notice any change with the carbinoxamine. The only nose spray that he has been on is Flonase. He is not sure whether this worked much at all. He is open to allergy shots, but he would like ot try the medications first to see if this helps at all.   They are going to be traveling in the next few days and he would like to feel better before getting on the road to travel. His wife does most of the driving. 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 Evidently there is a tenant in their home on the land and they need to do some plumbing work.   Otherwise, there have been no changes to his past medical history, surgical history, family history, or social history.  Review of Systems  Constitutional: Negative.  Negative for chills, fever, malaise/fatigue and weight loss.  HENT:  Negative for congestion, ear discharge and ear pain.        Positive for rhinorrhea. Positive for throat clearing.   Eyes:  Negative for pain, discharge and redness.  Respiratory:  Positive for cough, sputum production and shortness of breath. Negative for wheezing.   Cardiovascular: Negative.  Negative for chest pain and palpitations.  Gastrointestinal:  Negative for abdominal pain, heartburn, nausea and vomiting.  Skin: Negative.  Negative for itching and rash.  Neurological:  Negative for dizziness and headaches.  Endo/Heme/Allergies:  Positive for environmental allergies. Does not bruise/bleed easily.       Objective:   Blood pressure 104/66, pulse 86, temperature 98.8 F (37.1 C), resp. rate 16, SpO2 98 %. There is no height or weight on file to calculate  BMI.    Physical Exam Vitals reviewed.  Constitutional:      Appearance: He is well-developed.     Comments: Talkative.  Hard of hearing.  HENT:     Head: Normocephalic and atraumatic.     Right Ear: Tympanic membrane, ear canal and external ear normal. No drainage, swelling or tenderness. Tympanic membrane is not injected, scarred, erythematous, retracted or bulging.     Left Ear: Tympanic membrane, ear canal and external ear normal. No drainage, swelling or tenderness. Tympanic membrane is not injected, scarred, erythematous, retracted or bulging.     Nose: No nasal deformity, septal deviation, mucosal edema or rhinorrhea.     Right Turbinates: Enlarged, swollen and pale.     Left Turbinates: Enlarged, swollen and pale.     Right Sinus: No maxillary sinus tenderness or frontal sinus tenderness.     Left Sinus: No maxillary sinus tenderness or frontal sinus tenderness.     Comments: No nasal polyps. Rhinorrhea still present.     Mouth/Throat:     Mouth: Mucous membranes are not pale and not dry.     Pharynx: Uvula midline.  Eyes:     General:        Right eye: No discharge.        Left eye: No discharge.     Conjunctiva/sclera: Conjunctivae normal.     Right eye: Right conjunctiva is not injected. No chemosis.    Left eye: Left conjunctiva is not injected. No chemosis.    Pupils: Pupils are equal, round, and reactive to light.  Cardiovascular:     Rate and Rhythm: Normal rate and regular rhythm.     Heart sounds: Normal heart sounds.  Pulmonary:     Effort: Pulmonary effort is normal. No tachypnea, accessory muscle usage or respiratory distress.     Breath sounds: Normal breath sounds. No wheezing, rhonchi or rales.  Chest:     Chest wall: No tenderness.  Abdominal:     Tenderness: There is no abdominal tenderness. There is no guarding or rebound.  Lymphadenopathy:     Head:     Right side of head: No submandibular, tonsillar or occipital adenopathy.     Left side of head:  No submandibular, tonsillar or occipital adenopathy.     Cervical: No cervical adenopathy.  Skin:    Coloration: Skin is not pale.     Findings: No abrasion, erythema, petechiae or rash. Rash is not papular, urticarial or vesicular.  Neurological:     Mental Status: He is alert.  Psychiatric:        Behavior: Behavior is cooperative.  Diagnostic studies:    Spirometry: results abnormal (FEV1: 0.48/17%, FVC: 2.13/57%, FEV1/FVC: 23%).    Spirometry consistent with mixed obstructive and restrictive disease. Xopenex four puffs via MDI treatment given in clinic with improvement in FEV1, but not significant per ATS criteria. The FEV increased to 0.65 mL (24%), which was a 170 mL improvement.   Allergy Studies: none        Malachi Bonds, MD  [facility]

## 2023-03-24 ENCOUNTER — Encounter: Payer: Self-pay | Admitting: Allergy & Immunology

## 2023-03-27 ENCOUNTER — Ambulatory Visit: Payer: Medicare PPO | Admitting: Nurse Practitioner

## 2023-03-28 ENCOUNTER — Encounter: Payer: Self-pay | Admitting: Nurse Practitioner

## 2023-03-28 ENCOUNTER — Ambulatory Visit: Payer: Medicare PPO | Attending: Nurse Practitioner | Admitting: Nurse Practitioner

## 2023-03-28 VITALS — BP 122/76 | HR 68 | Ht 70.0 in | Wt 196.2 lb

## 2023-03-28 DIAGNOSIS — I2510 Atherosclerotic heart disease of native coronary artery without angina pectoris: Secondary | ICD-10-CM | POA: Diagnosis not present

## 2023-03-28 DIAGNOSIS — R0609 Other forms of dyspnea: Secondary | ICD-10-CM

## 2023-03-28 DIAGNOSIS — E785 Hyperlipidemia, unspecified: Secondary | ICD-10-CM | POA: Diagnosis not present

## 2023-03-28 MED ORDER — NITROGLYCERIN 0.4 MG SL SUBL: 0.4000 mg | SUBLINGUAL_TABLET | SUBLINGUAL | 3 refills | Status: AC | PRN

## 2023-03-28 NOTE — Patient Instructions (Addendum)
Medication Instructions:  Your physician has recommended you make the following change in your medication:  Start taking Nitroglycerin as needed  Continue all other medications as perscribed  Labwork: None  Testing/Procedures: none  Follow-Up: Your physician recommends that you schedule a follow-up appointment in: 3 Months with Philis Nettle   Any Other Special Instructions Will Be Listed Below (If Applicable). Referral to Pulmonary with Dr. Craige Cotta or Dr. Vassie Loll   If you need a refill on your cardiac medications before your next appointment, please call your pharmacy.

## 2023-03-28 NOTE — Progress Notes (Unsigned)
Cardiology Office Note:  .   Date:  03/28/2023 ID:  Benjamin [name], DOB 1941/03/25, MRN [id number] PCP: Benjamin Halon, MD  [facility] Providers Cardiologist:  Benjamin Dell, MD    History of Present Illness: .   Benjamin [name] is a 82 y.o. 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   Last seen by Dr. Diona [name] on December 25, 2022. Admitted to URI symptoms over the past month, saw his PCP. 2 view CXR was obtained and was negative, was encouraged to f/u with PCP.   Today he presents for follow-up with his wife. He states he continues to note stable dyspnea on exertion, continues to note URI symptoms, has cough and congestion, particularly points to left upper chest after he coughs during interview. Has recently seen his allergist. Wanting to know if he should undergo cardiac catheterization. Denies any chest pain, palpitations, syncope, presyncope, dizziness, orthopnea, PND, swelling or significant weight changes, acute bleeding, or claudication.  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   No ST deviation was noted from baseline EKG. The ECG was not diagnostic due to pharmacologic protocol.   The patient reported dyspnea during the stress test. Normal blood pressure and normal heart rate response noted during stress. Heart rate recovery was normal.   LV perfusion is abnormal. There is no evidence of ischemia. There is no evidence of infarction. Defect 1: There is a medium defect with mild reduction in uptake present in the basal inferior and inferolateral location(s) that is fixed. Consistent with artifact caused by diaphragmatic attenuation.   Evidence of transient ischemic dilation (TID) noted. TID was appreciated visually and quantitatively.  This may indicate balanced ischemia due to multivessel CAD.   Left ventricular  function is normal.   Consider coronary CTA if clinically indicated.  Echo 09/2021:  1. Left ventricular ejection fraction, by estimation, is 60 to 65%. The  left ventricle has normal function. The left ventricle has no regional  wall motion abnormalities. Left ventricular diastolic parameters are  consistent with Grade I diastolic  dysfunction (impaired relaxation).   2. Right ventricular systolic function is normal. The right ventricular  size is normal. Tricuspid regurgitation signal is inadequate for assessing  PA pressure.   3. The mitral valve is grossly normal. Trivial mitral valve  regurgitation.   4. The aortic valve is tricuspid. Aortic valve regurgitation is not  visualized. Aortic valve sclerosis/calcification is present, without any  evidence of aortic stenosis.   5. Aortic dilatation noted. There is borderline dilatation of the aortic  root, measuring 39 mm.   6. Prominent epicardial fat layer.   Comparison(s): Prior images reviewed side by side. LVEF remains normal at  60-65% with mild diastolic dysfunction.     Physical Exam:   VS:  BP 122/76   Pulse 68   Ht 5\' 10"  (1.778 m)   Wt 196 lb 3.2 oz (89 kg)   BMI 28.15 kg/m    Wt Readings from Last 3 Encounters:  03/28/23 196 lb 3.2 oz (89 kg)  03/07/23 193 lb (87.5 kg)  03/05/23 192 lb (87.1 kg)    GEN: Well nourished, well developed in no acute distress NECK: No JVD; No carotid bruits CARDIAC: S1/S2, RRR, no murmurs, rubs, gallops RESPIRATORY:  Clear to auscultation without rales, wheezing or rhonchi, strong bronchitic  cough on exam ABDOMEN: Soft, non-tender, non-distended EXTREMITIES:  No edema; No deformity   ASSESSMENT AND PLAN: .    CAD, s/p CABG NST 06/2022 revealed mild ischemia along basal inferior wall. Denies any chest pain. Has not has official pulmonary evaluation which his symptoms appear to be in etiology, see below. Not on aspirin d/t previous hx of GI bleeding on low dose Aspirin. Continue  Repatha, Lopressor, and will initiate NTG PRN for chest pain. Heart healthy diet encouraged. ED precautions discussed.   DOE Chronic since 2023, hx of Covid-19 and continues to note URI symptoms. Cough on exam sounds like bronchitis. Past 2 view CXR negative for anything acute. Agree with Dr. Diona [name] that he would benefit from referral to Pulmonary. Will make referral for him. If no improvement in symptoms by next follow-up, plan to discuss/consider invasive cardiac testing.   HLD Tolerating Repatha well. LDL 11/2022 26. Continue to follow-up with Dr. Rennis [name].   Dispo: Follow-up with me or APP in 3 months or sooner if anything changes.   Signed, Benjamin Dory, NP

## 2023-04-02 DIAGNOSIS — J479 Bronchiectasis, uncomplicated: Secondary | ICD-10-CM | POA: Diagnosis not present

## 2023-04-02 DIAGNOSIS — J449 Chronic obstructive pulmonary disease, unspecified: Secondary | ICD-10-CM | POA: Diagnosis not present

## 2023-04-04 ENCOUNTER — Ambulatory Visit: Payer: Medicare PPO | Admitting: Family Medicine

## 2023-04-04 VITALS — BP 130/84 | HR 78 | Temp 98.5°F | Resp 16 | Ht 68.5 in | Wt 195.2 lb

## 2023-04-04 DIAGNOSIS — K219 Gastro-esophageal reflux disease without esophagitis: Secondary | ICD-10-CM

## 2023-04-04 DIAGNOSIS — J302 Other seasonal allergic rhinitis: Secondary | ICD-10-CM | POA: Diagnosis not present

## 2023-04-04 DIAGNOSIS — J4489 Other specified chronic obstructive pulmonary disease: Secondary | ICD-10-CM | POA: Diagnosis not present

## 2023-04-04 DIAGNOSIS — J3089 Other allergic rhinitis: Secondary | ICD-10-CM | POA: Diagnosis not present

## 2023-04-04 DIAGNOSIS — J441 Chronic obstructive pulmonary disease with (acute) exacerbation: Secondary | ICD-10-CM

## 2023-04-04 MED ORDER — LEVALBUTEROL TARTRATE 45 MCG/ACT IN AERO: 2.0000 | INHALATION_SPRAY | Freq: Four times a day (QID) | RESPIRATORY_TRACT | 1 refills | Status: DC | PRN

## 2023-04-04 MED ORDER — IPRATROPIUM BROMIDE 0.03 % NA SOLN: 1.0000 | Freq: Two times a day (BID) | NASAL | 12 refills | Status: DC | PRN

## 2023-04-04 MED ORDER — TRELEGY ELLIPTA 200-62.5-25 MCG/ACT IN AEPB: 1.0000 | INHALATION_SPRAY | Freq: Every morning | RESPIRATORY_TRACT | 5 refills | Status: DC

## 2023-04-04 MED ORDER — ALBUTEROL SULFATE HFA 108 (90 BASE) MCG/ACT IN AERS
1.0000 | INHALATION_SPRAY | Freq: Four times a day (QID) | RESPIRATORY_TRACT | 1 refills | Status: DC | PRN
Start: 2023-04-04 — End: 2023-08-01

## 2023-04-04 MED ORDER — MONTELUKAST SODIUM 10 MG PO TABS: 10.0000 mg | ORAL_TABLET | Freq: Every day | ORAL | 1 refills | Status: DC

## 2023-04-04 MED ORDER — LEVALBUTEROL HCL 1.25 MG/3ML IN NEBU: 1.2500 mg | INHALATION_SOLUTION | RESPIRATORY_TRACT | 1 refills | Status: DC | PRN

## 2023-04-04 NOTE — Patient Instructions (Signed)
Asthma/COPD Continue Trelegy 200-1 puff once a day to prevent cough or wheeze Continue Xopenex via nebulizer or Xopenex HFA once every 6 hours as needed for cough or wheeze You may use Xopenex 5-15 minutes before activity to decrease cough or wheeze Consider Dupixent injections to control asthma symptoms  Allergic rhinitis Continue allergen avoidance measures directed toward grass pollen, mold, and dust mites as listed below Continue an antihistamine once a day as needed for a runny nose or itch. Remember to rotate to a different antihistamine about every 3 months. Some examples of over the counter antihistamines include Zyrtec (cetirizine), Xyzal (levocetirizine), Allegra (fexofenadine), and Claritin (loratidine).  Continue Flonase 2 sprays in each nostril once a day as needed for a stuffy nose.  In the right nostril, point the applicator out toward the right ear. In the left nostril, point the applicator out toward the left ear Continue ipratropium 1 spray in each nostril once or twice a day as needed for a runny nose Consider saline nasal rinses as needed for nasal symptoms. Use this before any medicated nasal sprays for best result  Reflux Continue dietary and lifestyle modifications as listed below  Call the clinic if this treatment plan is not working well for you  Follow up in 3 months or sooner if needed.   Lifestyle Changes for Controlling GERD When you have GERD, stomach acid feels as if it's backing up toward your mouth. Whether or not you take medication to control your GERD, your symptoms can often be improved with lifestyle changes.   Raise Your Head Reflux is more likely to strike when you're lying down flat, because stomach fluid can flow backward more easily. Raising the head of your bed 4-6 inches can help. To do this: Slide blocks or books under the legs at the head of your bed. Or, place a wedge under the mattress. Many foam stores can make a suitable wedge for you.  The wedge should run from your waist to the top of your head. Don't just prop your head on several pillows. This increases pressure on your stomach. It can make GERD worse.  Watch Your Eating Habits Certain foods may increase the acid in your stomach or relax the lower esophageal sphincter, making GERD more likely. It's best to avoid the following: Coffee, tea, and carbonated drinks (with and without caffeine) Fatty, fried, or spicy food Mint, chocolate, onions, and tomatoes Any other foods that seem to irritate your stomach or cause you pain  Relieve the Pressure Eat smaller meals, even if you have to eat more often. Don't lie down right after you eat. Wait a few hours for your stomach to empty. Avoid tight belts and tight-fitting clothes. Lose excess weight.  Tobacco and Alcohol Avoid smoking tobacco and drinking alcohol. They can make GERD symptoms worse.  Reducing Pollen Exposure The American Academy of Allergy, Asthma and Immunology suggests the following steps to reduce your exposure to pollen during allergy seasons. Do not hang sheets or clothing out to dry; pollen may collect on these items. Do not mow lawns or spend time around freshly cut grass; mowing stirs up pollen. Keep windows closed at night.  Keep car windows closed while driving. Minimize morning activities outdoors, a time when pollen counts are usually at their highest. Stay indoors as much as possible when pollen counts or humidity is high and on windy days when pollen tends to remain in the air longer. Use air conditioning when possible.  Many air conditioners have filters  that trap the pollen spores. Use a HEPA room air filter to remove pollen form the indoor air you breathe. Control of Mold Allergen Mold and fungi can grow on a variety of surfaces provided certain temperature and moisture conditions exist.  Outdoor molds grow on plants, decaying vegetation and soil.  The major outdoor mold, Alternaria and  Cladosporium, are found in very high numbers during hot and dry conditions.  Generally, a late Summer - Fall peak is seen for common outdoor fungal spores.  Rain will temporarily lower outdoor mold spore count, but counts rise rapidly when the rainy period ends.  The most important indoor molds are Aspergillus and Penicillium.  Dark, humid and poorly ventilated basements are ideal sites for mold growth.  The next most common sites of mold growth are the bathroom and the kitchen.  Outdoor [employer] Use air conditioning and keep windows closed Avoid exposure to decaying vegetation. Avoid leaf raking. Avoid grain handling. Consider wearing a face mask if working in moldy areas.  Indoor Mold Control Maintain humidity below 50%. Clean washable surfaces with 5% bleach solution. Remove sources e.g. Contaminated carpets.    Control of Dust Mite Allergen Dust mites play a major role in allergic asthma and rhinitis. They occur in environments with high humidity wherever human skin is found. Dust mites absorb humidity from the atmosphere (ie, they do not drink) and feed on organic matter (including shed human and animal skin). Dust mites are a microscopic type of insect that you cannot see with the naked eye. High levels of dust mites have been detected from mattresses, pillows, carpets, upholstered furniture, bed covers, clothes, soft toys and any woven material. The principal allergen of the dust mite is found in its feces. A gram of dust may contain 1,000 mites and 250,000 fecal particles. Mite antigen is easily measured in the air during house cleaning activities. Dust mites do not bite and do not cause harm to humans, other than by triggering allergies/asthma.  Ways to decrease your exposure to dust mites in your home:  1. Encase mattresses, box springs and pillows with a mite-impermeable barrier or cover  2. Wash sheets, blankets and drapes weekly in hot water (130 F) with detergent and dry  them in a dryer on the hot setting.  3. Have the room cleaned frequently with a vacuum cleaner and a damp dust-mop. For carpeting or rugs, vacuuming with a vacuum cleaner equipped with a high-efficiency particulate air (HEPA) filter. The dust mite allergic individual should not be in a room which is being cleaned and should wait 1 hour after cleaning before going into the room.  4. Do not sleep on upholstered furniture (eg, couches).  5. If possible removing carpeting, upholstered furniture and drapery from the home is ideal. Horizontal blinds should be eliminated in the rooms where the person spends the most time (bedroom, study, television room). Washable vinyl, roller-type shades are optimal.  6. Remove all non-washable stuffed toys from the bedroom. Wash stuffed toys weekly like sheets and blankets above.  7. Reduce indoor humidity to less than 50%. Inexpensive humidity monitors can be purchased at most hardware stores. Do not use a humidifier as can make the problem worse and are not recommended.

## 2023-04-04 NOTE — Progress Notes (Unsigned)
   [address] [place] [address] Dept: [phone]  FOLLOW UP NOTE  Patient ID: Benjamin [name], male    DOB: 1940/09/24  Age: 82 y.o. MRN: [id number] Date of Office Visit: 04/04/2023  Assessment  Chief Complaint: No chief complaint on file.  HPI Benjamin [name]    Drug Allergies:  Allergies  Allergen Reactions   Arsenic Swelling    Severe swelling if patient comes in contact    Contrast Media [Iodinated Contrast Media] Swelling and Rash   Nitrofurantoin Monohyd Macro Itching    Completed the full course while taking Benadryl for    Statins Rash    Joint pain    Physical Exam: There were no vitals taken for this visit.   Physical Exam  Diagnostics: FVC 2.66 which is 73% of predicted value, FEV1 0.86 which is 32% of predicted value. Spirometry indicates severe obstruction.   Assessment and Plan: No diagnosis found.  No orders of the defined types were placed in this encounter.   There are no Patient Instructions on file for this visit.  No follow-ups on file.    Thank you for the opportunity to care for this patient.  Please do not hesitate to contact me with questions.  Thermon Leyland, FNP [facility]

## 2023-04-06 ENCOUNTER — Encounter: Payer: Self-pay | Admitting: Family Medicine

## 2023-04-06 DIAGNOSIS — J302 Other seasonal allergic rhinitis: Secondary | ICD-10-CM | POA: Insufficient documentation

## 2023-04-08 ENCOUNTER — Telehealth: Payer: Self-pay | Admitting: Family Medicine

## 2023-04-08 DIAGNOSIS — J4489 Other specified chronic obstructive pulmonary disease: Secondary | ICD-10-CM

## 2023-04-08 MED ORDER — LEVALBUTEROL HCL 1.25 MG/3ML IN NEBU: 1.2500 mg | INHALATION_SOLUTION | RESPIRATORY_TRACT | 1 refills | Status: DC | PRN

## 2023-04-08 MED ORDER — LEVALBUTEROL TARTRATE 45 MCG/ACT IN AERO: 2.0000 | INHALATION_SPRAY | Freq: Four times a day (QID) | RESPIRATORY_TRACT | 1 refills | Status: DC | PRN

## 2023-04-08 NOTE — Telephone Encounter (Signed)
Resent medications to [facility].

## 2023-04-08 NOTE — Telephone Encounter (Signed)
Kristine called from [facility] called and stated the medications albuterol albuterol (VENTOLIN HFA) 108 (90 Base) MCG/ACT inhaler, levalbuterol levalbuterol (XOPENEX HFA) 45 MCG/ACT inhaler, levalbuterol levalbuterol (XOPENEX) 1.25 MG/3ML nebulizer solution stated they can't fill it because they don't have those medications and would have to go to his local pharmacy. Call back number [phone] ext [phone].

## 2023-04-18 ENCOUNTER — Telehealth: Payer: Self-pay | Admitting: *Deleted

## 2023-04-18 NOTE — Telephone Encounter (Signed)
-----   Message from Thermon Leyland sent at 04/06/2023  1:43 PM EDT ----- Hi , Can you please submit this patient for Dupixent for asthma control. His last AEC was 300 on 11/18/2022. Thank you

## 2023-04-18 NOTE — Telephone Encounter (Signed)
Called patient and he advised FEP is his drug coverage he thinks os will try to get approval from same and reach out to patient if we need to go thru PAP

## 2023-04-21 ENCOUNTER — Telehealth: Payer: Self-pay | Admitting: Allergy & Immunology

## 2023-04-21 MED ORDER — TRELEGY ELLIPTA 200-62.5-25 MCG/ACT IN AEPB: 1.0000 | INHALATION_SPRAY | Freq: Every morning | RESPIRATORY_TRACT | 5 refills | Status: DC

## 2023-04-21 NOTE — Telephone Encounter (Signed)
Patient called in needing his Trelegy medication filled and sent to the pharmacy. Patient pharmacy is [facility] through [place]. A good call back number is [phone].

## 2023-04-21 NOTE — Telephone Encounter (Signed)
Spoke to patient in regards to the mediation refill. [facility] does not carry Trelegy. Medication was resent to the requested pharmacy.

## 2023-04-22 ENCOUNTER — Telehealth: Payer: Self-pay | Admitting: Allergy & Immunology

## 2023-04-22 NOTE — Telephone Encounter (Signed)
Have you sent ventilator for pt script to [facility]

## 2023-04-22 NOTE — Telephone Encounter (Signed)
[facility] called stating that Dr. Dellis Anes is sending in a ventilator for the patient. [facility] has not received the order for the ventilator.

## 2023-04-27 ENCOUNTER — Other Ambulatory Visit: Payer: Self-pay | Admitting: Internal Medicine

## 2023-04-27 DIAGNOSIS — N400 Enlarged prostate without lower urinary tract symptoms: Secondary | ICD-10-CM

## 2023-04-28 NOTE — Telephone Encounter (Signed)
Does he mean a nebulizer? Please send in one of those. I do not prescribe ventilators.Marland KitchenMarland Kitchen

## 2023-05-01 MED ORDER — NEBULIZER MISC: 1.0000 | 1 refills | Status: DC | PRN

## 2023-05-01 NOTE — Telephone Encounter (Signed)
Called and left a message to inform patient that we will sent in a nebulizer to [facility] today so that he can pick one up.

## 2023-05-02 NOTE — Telephone Encounter (Signed)
Insurance has denied Dupixent for patient asthma due to not having at least 50% adherence to corticosteroid inhaler in last six months. I did look through all his notes and there has been steroid inhaler prescribed until July of this year. Please advise

## 2023-05-02 NOTE — Telephone Encounter (Signed)
He has been doing the Trelegy as ordered with 100% compliance since March 19, 2023. Do we just wait and reapply in a few months? Looks like he had 4 pred tapers over the last year if that helps at all. Thank you

## 2023-05-03 DIAGNOSIS — J449 Chronic obstructive pulmonary disease, unspecified: Secondary | ICD-10-CM | POA: Diagnosis not present

## 2023-05-03 DIAGNOSIS — J479 Bronchiectasis, uncomplicated: Secondary | ICD-10-CM | POA: Diagnosis not present

## 2023-05-05 NOTE — Telephone Encounter (Signed)
Ok. Thank you. I appreciate all your help

## 2023-05-20 ENCOUNTER — Other Ambulatory Visit: Payer: Self-pay | Admitting: Internal Medicine

## 2023-05-20 ENCOUNTER — Telehealth: Payer: Self-pay | Admitting: Internal Medicine

## 2023-05-20 DIAGNOSIS — J441 Chronic obstructive pulmonary disease with (acute) exacerbation: Secondary | ICD-10-CM

## 2023-05-20 DIAGNOSIS — I5032 Chronic diastolic (congestive) heart failure: Secondary | ICD-10-CM

## 2023-05-20 DIAGNOSIS — E785 Hyperlipidemia, unspecified: Secondary | ICD-10-CM

## 2023-05-20 DIAGNOSIS — R7303 Prediabetes: Secondary | ICD-10-CM

## 2023-05-20 NOTE — Telephone Encounter (Signed)
Pt here to get labs before next appt 9/11 but no labs were in. Says they will come back by Fri morning. Please advise Thank you

## 2023-05-20 NOTE — Telephone Encounter (Signed)
Patient advised, they will come Friday

## 2023-05-23 DIAGNOSIS — E785 Hyperlipidemia, unspecified: Secondary | ICD-10-CM | POA: Diagnosis not present

## 2023-05-23 DIAGNOSIS — R7303 Prediabetes: Secondary | ICD-10-CM | POA: Diagnosis not present

## 2023-05-24 LAB — CMP14+EGFR
ALT: 25 IU/L (ref 0–44)
AST: 19 IU/L (ref 0–40)
Albumin: 4.2 g/dL (ref 3.7–4.7)
Alkaline Phosphatase: 57 IU/L (ref 44–121)
BUN/Creatinine Ratio: 14 (ref 10–24)
BUN: 17 mg/dL (ref 8–27)
Bilirubin Total: 0.8 mg/dL (ref 0.0–1.2)
CO2: 23 mmol/L (ref 20–29)
Calcium: 9.1 mg/dL (ref 8.6–10.2)
Chloride: 107 mmol/L — ABNORMAL HIGH (ref 96–106)
Creatinine, Ser: 1.21 mg/dL (ref 0.76–1.27)
Globulin, Total: 2.2 g/dL (ref 1.5–4.5)
Glucose: 107 mg/dL — ABNORMAL HIGH (ref 70–99)
Potassium: 4.7 mmol/L (ref 3.5–5.2)
Sodium: 144 mmol/L (ref 134–144)
Total Protein: 6.4 g/dL (ref 6.0–8.5)
eGFR: 60 mL/min/{1.73_m2} (ref >59)

## 2023-05-24 LAB — HEMOGLOBIN A1C
Est. average glucose Bld gHb Est-mCnc: 131 mg/dL
Hgb A1c MFr Bld: 6.2 % — ABNORMAL HIGH (ref 4.8–5.6)

## 2023-05-24 LAB — LIPID PANEL
Chol/HDL Ratio: 1.7 ratio (ref 0.0–5.0)
Cholesterol, Total: 80 mg/dL — ABNORMAL LOW (ref 100–199)
HDL: 46 mg/dL (ref >39)
LDL Chol Calc (NIH): 16 mg/dL (ref 0–99)
Triglycerides: 93 mg/dL (ref 0–149)
VLDL Cholesterol Cal: 18 mg/dL (ref 5–40)

## 2023-05-27 ENCOUNTER — Institutional Professional Consult (permissible substitution) (HOSPITAL_BASED_OUTPATIENT_CLINIC_OR_DEPARTMENT_OTHER): Payer: Medicare PPO | Admitting: Pulmonary Disease

## 2023-05-28 ENCOUNTER — Encounter: Payer: Self-pay | Admitting: Internal Medicine

## 2023-05-28 ENCOUNTER — Institutional Professional Consult (permissible substitution) (HOSPITAL_BASED_OUTPATIENT_CLINIC_OR_DEPARTMENT_OTHER): Payer: Medicare PPO | Admitting: Pulmonary Disease

## 2023-05-28 ENCOUNTER — Ambulatory Visit (INDEPENDENT_AMBULATORY_CARE_PROVIDER_SITE_OTHER): Payer: Medicare PPO | Admitting: Internal Medicine

## 2023-05-28 VITALS — BP 108/79 | HR 90 | Resp 16 | Ht 68.0 in | Wt 198.2 lb

## 2023-05-28 DIAGNOSIS — Z23 Encounter for immunization: Secondary | ICD-10-CM

## 2023-05-28 DIAGNOSIS — R252 Cramp and spasm: Secondary | ICD-10-CM | POA: Insufficient documentation

## 2023-05-28 DIAGNOSIS — R350 Frequency of micturition: Secondary | ICD-10-CM

## 2023-05-28 DIAGNOSIS — R351 Nocturia: Secondary | ICD-10-CM

## 2023-05-28 DIAGNOSIS — J329 Chronic sinusitis, unspecified: Secondary | ICD-10-CM | POA: Diagnosis not present

## 2023-05-28 DIAGNOSIS — K219 Gastro-esophageal reflux disease without esophagitis: Secondary | ICD-10-CM | POA: Diagnosis not present

## 2023-05-28 DIAGNOSIS — E785 Hyperlipidemia, unspecified: Secondary | ICD-10-CM | POA: Diagnosis not present

## 2023-05-28 DIAGNOSIS — N401 Enlarged prostate with lower urinary tract symptoms: Secondary | ICD-10-CM

## 2023-05-28 DIAGNOSIS — J441 Chronic obstructive pulmonary disease with (acute) exacerbation: Secondary | ICD-10-CM

## 2023-05-28 MED ORDER — FINASTERIDE 5 MG PO TABS
5.0000 mg | ORAL_TABLET | Freq: Every day | ORAL | 1 refills | Status: DC
Start: 2023-05-28 — End: 2023-07-10

## 2023-05-28 MED ORDER — TAMSULOSIN HCL 0.4 MG PO CAPS
ORAL_CAPSULE | ORAL | 1 refills | Status: AC
Start: 2023-05-28 — End: ?

## 2023-05-28 NOTE — Assessment & Plan Note (Signed)
On Repatha -has statin intolerance

## 2023-05-28 NOTE — Assessment & Plan Note (Signed)
Chronic leg cramps, could be due to electrolyte imbalance Advised to continue magnesium supplement Can take tonic water or mustard as needed If persistent, can try muscle relaxant as needed

## 2023-05-28 NOTE — Assessment & Plan Note (Signed)
He has persistent symptoms despite symptomatic treatment Singulair for allergies, avoid daily decongestant use Atrovent nasal spray PRN Advised to use humidifier or vaporizer at home Followed by allergy and immunology

## 2023-05-28 NOTE — Assessment & Plan Note (Addendum)
S/p TURP Followed by [facility] with Flomax 0.8 mg QD and finasteride 5 mg QD
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
## 2023-05-28 NOTE — Assessment & Plan Note (Addendum)
Overall well-controlled with Trelegy now Xopenex inhaler and nebulizer as needed for dyspnea or wheezing Has oxygen desaturation upon ambulation, has home O2 for as needed use/upon ambulation Referred to pulmonology - Dr. Vassie Loll

## 2023-05-28 NOTE — Patient Instructions (Addendum)
Please continue to take medications as prescribed.  Please continue to follow low carb diet and ambulate as tolerated.  Please try tonic water for leg cramps.

## 2023-05-28 NOTE — Assessment & Plan Note (Signed)
On pantoprazole 40 mg twice daily Followed by GI

## 2023-05-30 ENCOUNTER — Other Ambulatory Visit: Payer: Self-pay

## 2023-05-30 MED ORDER — TRELEGY ELLIPTA 200-62.5-25 MCG/ACT IN AEPB: 1.0000 | INHALATION_SPRAY | Freq: Every morning | RESPIRATORY_TRACT | 1 refills | Status: DC

## 2023-05-30 NOTE — Telephone Encounter (Signed)
Patient came in requesting 90 day supply. 90 supply has been sent into [facility].

## 2023-06-02 MED ORDER — TRELEGY ELLIPTA 200-62.5-25 MCG/ACT IN AEPB: 1.0000 | INHALATION_SPRAY | Freq: Every morning | RESPIRATORY_TRACT | 1 refills | Status: DC

## 2023-06-02 NOTE — Addendum Note (Signed)
Addended by: Elsworth Soho on: 06/02/2023 09:35 AM   Modules accepted: Orders

## 2023-06-03 DIAGNOSIS — J449 Chronic obstructive pulmonary disease, unspecified: Secondary | ICD-10-CM | POA: Diagnosis not present

## 2023-06-03 DIAGNOSIS — J479 Bronchiectasis, uncomplicated: Secondary | ICD-10-CM | POA: Diagnosis not present

## 2023-06-10 DIAGNOSIS — M1712 Unilateral primary osteoarthritis, left knee: Secondary | ICD-10-CM | POA: Diagnosis not present

## 2023-06-17 ENCOUNTER — Other Ambulatory Visit: Payer: Self-pay | Admitting: Internal Medicine

## 2023-06-17 DIAGNOSIS — K219 Gastro-esophageal reflux disease without esophagitis: Secondary | ICD-10-CM

## 2023-06-23 DIAGNOSIS — M4306 Spondylolysis, lumbar region: Secondary | ICD-10-CM | POA: Diagnosis not present

## 2023-06-23 DIAGNOSIS — M1712 Unilateral primary osteoarthritis, left knee: Secondary | ICD-10-CM | POA: Diagnosis not present

## 2023-06-23 DIAGNOSIS — S83242D Other tear of medial meniscus, current injury, left knee, subsequent encounter: Secondary | ICD-10-CM | POA: Diagnosis not present

## 2023-06-30 ENCOUNTER — Ambulatory Visit: Payer: Medicare PPO | Attending: Nurse Practitioner | Admitting: Nurse Practitioner

## 2023-06-30 NOTE — Progress Notes (Deleted)
Cardiology Office Note:  .   Date:  03/28/2023 ID:  Benjamin [name], DOB Aug 27, 1941, MRN [id number] PCP: Anabel Halon, MD  [facility] Providers Cardiologist:  Nona Dell, MD    History of Present Illness: .   Benjamin [name] is a 82 y.o. 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   Last seen by Dr. Diona Browner on December 25, 2022. Admitted to URI symptoms over the past month, saw his PCP. 2 view CXR was obtained and was negative, was encouraged to f/u with PCP.   Today he presents for follow-up with his wife. He states he continues to note stable dyspnea on exertion, continues to note URI symptoms, has cough and congestion, particularly points to left upper chest after he coughs during interview. Has recently seen his allergist. Wanting to know if he should undergo cardiac catheterization. Denies any chest pain, palpitations, syncope, presyncope, dizziness, orthopnea, PND, swelling or significant weight changes, acute bleeding, or claudication.  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   No ST deviation was noted from baseline EKG. The ECG was not diagnostic due to pharmacologic protocol.   The patient reported dyspnea during the stress test. Normal blood pressure and normal heart rate response noted during stress. Heart rate recovery was normal.   LV perfusion is abnormal. There is no evidence of ischemia. There is no evidence of infarction. Defect 1: There is a medium defect with mild reduction in uptake present in the basal inferior and inferolateral location(s) that is fixed. Consistent with artifact caused by diaphragmatic attenuation.   Evidence of transient ischemic dilation (TID) noted. TID was appreciated visually and quantitatively.  This may indicate balanced ischemia due to multivessel CAD.   Left ventricular  function is normal.   Consider coronary CTA if clinically indicated.  Echo 09/2021:  1. Left ventricular ejection fraction, by estimation, is 60 to 65%. The  left ventricle has normal function. The left ventricle has no regional  wall motion abnormalities. Left ventricular diastolic parameters are  consistent with Grade I diastolic  dysfunction (impaired relaxation).   2. Right ventricular systolic function is normal. The right ventricular  size is normal. Tricuspid regurgitation signal is inadequate for assessing  PA pressure.   3. The mitral valve is grossly normal. Trivial mitral valve  regurgitation.   4. The aortic valve is tricuspid. Aortic valve regurgitation is not  visualized. Aortic valve sclerosis/calcification is present, without any  evidence of aortic stenosis.   5. Aortic dilatation noted. There is borderline dilatation of the aortic  root, measuring 39 mm.   6. Prominent epicardial fat layer.   Comparison(s): Prior images reviewed side by side. LVEF remains normal at  60-65% with mild diastolic dysfunction.     Physical Exam:   VS:  There were no vitals taken for this visit.   Wt Readings from Last 3 Encounters:  05/28/23 198 lb 3.2 oz (89.9 kg)  04/04/23 195 lb 3.2 oz (88.5 kg)  03/28/23 196 lb 3.2 oz (89 kg)    GEN: Well nourished, well developed in no acute distress NECK: No JVD; No carotid bruits CARDIAC: S1/S2, RRR, no murmurs, rubs, gallops RESPIRATORY:  Clear to auscultation without rales, wheezing or rhonchi, strong bronchitic cough on exam ABDOMEN: Soft, non-tender, non-distended EXTREMITIES:  No edema; No deformity   ASSESSMENT  AND PLAN: .    CAD, s/p CABG NST 06/2022 revealed mild ischemia along basal inferior wall. Denies any chest pain. Has not has official pulmonary evaluation which his symptoms appear to be in etiology, see below. Not on aspirin d/t previous hx of GI bleeding on low dose Aspirin. Continue Repatha, Lopressor, and will initiate NTG  PRN for chest pain. Heart healthy diet encouraged. ED precautions discussed.   DOE Chronic since 2023, hx of Covid-19 and continues to note URI symptoms. Cough on exam sounds like bronchitis. Past 2 view CXR negative for anything acute. Agree with Dr. Diona Browner that he would benefit from referral to Pulmonary. Will make referral for him. If no improvement in symptoms by next follow-up, plan to discuss/consider invasive cardiac testing.   HLD Tolerating Repatha well. LDL 11/2022 26. Continue to follow-up with Dr. Rennis Golden.   Dispo: Follow-up with me or [facility] in 3 months or sooner if anything changes.   Signed, Sharlene Dory, NP

## 2023-07-01 DIAGNOSIS — M25562 Pain in left knee: Secondary | ICD-10-CM | POA: Diagnosis not present

## 2023-07-01 DIAGNOSIS — M25462 Effusion, left knee: Secondary | ICD-10-CM | POA: Diagnosis not present

## 2023-07-01 DIAGNOSIS — M7122 Synovial cyst of popliteal space [Baker], left knee: Secondary | ICD-10-CM | POA: Diagnosis not present

## 2023-07-01 DIAGNOSIS — M1712 Unilateral primary osteoarthritis, left knee: Secondary | ICD-10-CM | POA: Diagnosis not present

## 2023-07-03 DIAGNOSIS — J449 Chronic obstructive pulmonary disease, unspecified: Secondary | ICD-10-CM | POA: Diagnosis not present

## 2023-07-03 DIAGNOSIS — J479 Bronchiectasis, uncomplicated: Secondary | ICD-10-CM | POA: Diagnosis not present

## 2023-07-04 ENCOUNTER — Encounter: Payer: Self-pay | Admitting: Family Medicine

## 2023-07-04 ENCOUNTER — Other Ambulatory Visit: Payer: Self-pay

## 2023-07-04 ENCOUNTER — Ambulatory Visit (INDEPENDENT_AMBULATORY_CARE_PROVIDER_SITE_OTHER): Payer: Medicare PPO | Admitting: Family Medicine

## 2023-07-04 VITALS — BP 118/60 | HR 88 | Temp 98.7°F | Resp 24 | Ht 68.0 in | Wt 204.2 lb

## 2023-07-04 DIAGNOSIS — J4489 Other specified chronic obstructive pulmonary disease: Secondary | ICD-10-CM | POA: Diagnosis not present

## 2023-07-04 DIAGNOSIS — K219 Gastro-esophageal reflux disease without esophagitis: Secondary | ICD-10-CM | POA: Diagnosis not present

## 2023-07-04 DIAGNOSIS — J302 Other seasonal allergic rhinitis: Secondary | ICD-10-CM

## 2023-07-04 DIAGNOSIS — J3089 Other allergic rhinitis: Secondary | ICD-10-CM | POA: Diagnosis not present

## 2023-07-04 NOTE — Addendum Note (Signed)
Addended by: Philipp Deputy on: 07/04/2023 03:23 PM   Modules accepted: Orders

## 2023-07-04 NOTE — Progress Notes (Signed)
[address] [place] [address] Dept: [phone]  FOLLOW UP NOTE  Patient ID: Benjamin [name], male    DOB: [birth year]  Age: 82 y.o. MRN: [id number] Date of Office Visit: 07/04/2023  Assessment  Chief Complaint: Follow-up (States  that both meds are work but still gets short of breath recovers quicker)  HPI Jamale Petrak is an 82 year old male who presents to the clinic for follow-up visit.  He was last seen in this clinic on 04/04/2023 by Thermon Leyland, FNP, for evaluation of asthma COPD overlap syndrome, allergic rhinitis, and reflux.  He is accompanied by his wife who assists with history.  At today's visit, he reports his asthma has been much more well-controlled with shortness of breath with activity as the main symptom.  He denies shortness of breath at rest, wheeze or cough with activity or rest.  He continues Trelegy 200-1 puff once a day and rarely needs to use Xopenex.  He reports relief of shortness of breath within 10 minutes of using Xopenex or 20 to 25 minutes without using Xopenex.  He remains moderately interested in Dupixent and will make any further decisions after his next follow-up appointment.  Allergic rhinitis is reported as moderately well-controlled with clear rhinorrhea as the main symptom.  He denies nasal congestion, sneezing, and postnasal drainage.  He continues ipratropium 2 sprays in each nostril twice a day which has really cleared up postnasal drainage for him resulting in fewer episodes of coughing. His last environmental allergy testing was on 03/05/2023 and was positive to grass pollen, mold, and dust mites.  He reports that he does not have dust mite covers on his bedding or pillow and that he has a sleep number bed which will not fit a dust mite cover.  Reflux is reported as well-controlled with no symptoms including heartburn or vomiting.  He does report occasionally hearing a gurgling in his throat.  He continues pantoprazole 40 mg twice a day.  He  reports that he frequently does not take pantoprazole 30 minutes before a meal as his mealtimes remain very unpredictable.  His current medications are listed in the chart.  Drug Allergies:  Allergies  Allergen Reactions   Arsenic Swelling    Severe swelling if patient comes in contact    Contrast Media [Iodinated Contrast Media] Swelling and Rash   Nitrofurantoin Monohyd Macro Itching    Completed the full course while taking Benadryl for    Statins Rash    Joint pain    Physical Exam: BP 118/60   Pulse 88   Temp 98.7 F (37.1 C)   Resp (!) 24   Ht 5\' 8"  (1.727 m)   Wt 204 lb 3.2 oz (92.6 kg)   SpO2 98%   BMI 31.05 kg/m    Physical Exam Vitals reviewed.  Constitutional:      Appearance: Normal appearance.  HENT:     Head: Normocephalic and atraumatic.     Right Ear: Tympanic membrane normal.     Left Ear: Tympanic membrane normal.     Nose:     Comments: Bilateral naris slightly erythematous with thin clear nasal drainage noted.  Pharynx normal.  Ears normal.  Bilateral hearing aids in place.  Eyes normal.    Mouth/Throat:     Pharynx: Oropharynx is clear.  Eyes:     Conjunctiva/sclera: Conjunctivae normal.  Cardiovascular:     Rate and Rhythm: Normal rate and regular rhythm.     Heart sounds: Normal heart sounds. No  murmur heard. Pulmonary:     Effort: Pulmonary effort is normal.     Breath sounds: Normal breath sounds.     Comments: Lungs clear to auscultation Musculoskeletal:        General: Normal range of motion.     Cervical back: Normal range of motion and neck supple.  Skin:    General: Skin is warm and dry.  Neurological:     Mental Status: He is alert and oriented to person, place, and time.  Psychiatric:        Mood and Affect: Mood normal.        Behavior: Behavior normal.        Thought Content: Thought content normal.        Judgment: Judgment normal.     Diagnostics: FVC 2.36 which is 65% of predicted value, FEV1 1.64 which is 61% of  predicted value.  Spirometry indicates possible restriction.  Assessment and Plan: 1. Asthma-COPD overlap syndrome (HCC)   2. Seasonal and perennial allergic rhinitis   3. Gastroesophageal reflux disease, unspecified whether esophagitis present     No orders of the defined types were placed in this encounter.   Patient Instructions  Asthma/COPD Continue Trelegy 200-1 puff once a day to prevent cough or wheeze Continue Xopenex via nebulizer or Xopenex HFA once every 6 hours as needed for cough or wheeze You may use Xopenex 5-15 minutes before activity to decrease cough or wheeze Consider Dupixent injections to control asthma symptoms in the future  Allergic rhinitis Continue allergen avoidance measures directed toward grass pollen, mold, and dust mites as listed below Continue an antihistamine once a day as needed for a runny nose or itch. Remember to rotate to a different antihistamine about every 3 months. Some examples of over the counter antihistamines include Zyrtec (cetirizine), Xyzal (levocetirizine), Allegra (fexofenadine), and Claritin (loratidine).  Continue Flonase 2 sprays in each nostril once a day as needed for a stuffy nose.  In the right nostril, point the applicator out toward the right ear. In the left nostril, point the applicator out toward the left ear Continue ipratropium 1-2 sprays in each nostril once or twice a day as needed for a runny nose Consider saline nasal rinses as needed for nasal symptoms. Use this before any medicated nasal sprays for best result  Reflux Continue dietary and lifestyle modifications as listed below  Call the clinic if this treatment plan is not working well for you  Follow up in 3 months or sooner if needed.   Return in about 3 months (around 10/04/2023), or if symptoms worsen or fail to improve.    Thank you for the opportunity to care for this patient.  Please do not hesitate to contact me with questions.  Thermon Leyland,  FNP [facility]

## 2023-07-04 NOTE — Patient Instructions (Addendum)
Asthma/COPD Continue Trelegy 200-1 puff once a day to prevent cough or wheeze Continue Xopenex via nebulizer or Xopenex HFA once every 6 hours as needed for cough or wheeze You may use Xopenex 5-15 minutes before activity to decrease cough or wheeze Consider Dupixent injections to control asthma symptoms in the future  Allergic rhinitis Continue allergen avoidance measures directed toward grass pollen, mold, and dust mites as listed below Continue an antihistamine once a day as needed for a runny nose or itch. Remember to rotate to a different antihistamine about every 3 months. Some examples of over the counter antihistamines include Zyrtec (cetirizine), Xyzal (levocetirizine), Allegra (fexofenadine), and Claritin (loratidine).  Continue Flonase 2 sprays in each nostril once a day as needed for a stuffy nose.  In the right nostril, point the applicator out toward the right ear. In the left nostril, point the applicator out toward the left ear Continue ipratropium 1-2 sprays in each nostril once or twice a day as needed for a runny nose Consider saline nasal rinses as needed for nasal symptoms. Use this before any medicated nasal sprays for best result  Reflux Continue dietary and lifestyle modifications as listed below  Call the clinic if this treatment plan is not working well for you  Follow up in 3 months or sooner if needed.   Lifestyle Changes for Controlling GERD When you have GERD, stomach acid feels as if it's backing up toward your mouth. Whether or not you take medication to control your GERD, your symptoms can often be improved with lifestyle changes.   Raise Your Head Reflux is more likely to strike when you're lying down flat, because stomach fluid can flow backward more easily. Raising the head of your bed 4-6 inches can help. To do this: Slide blocks or books under the legs at the head of your bed. Or, place a wedge under the mattress. Many foam stores can make a  suitable wedge for you. The wedge should run from your waist to the top of your head. Don't just prop your head on several pillows. This increases pressure on your stomach. It can make GERD worse.  Watch Your Eating Habits Certain foods may increase the acid in your stomach or relax the lower esophageal sphincter, making GERD more likely. It's best to avoid the following: Coffee, tea, and carbonated drinks (with and without caffeine) Fatty, fried, or spicy food Mint, chocolate, onions, and tomatoes Any other foods that seem to irritate your stomach or cause you pain  Relieve the Pressure Eat smaller meals, even if you have to eat more often. Don't lie down right after you eat. Wait a few hours for your stomach to empty. Avoid tight belts and tight-fitting clothes. Lose excess weight.  Tobacco and Alcohol Avoid smoking tobacco and drinking alcohol. They can make GERD symptoms worse.  Reducing Pollen Exposure The American Academy of Allergy, Asthma and Immunology suggests the following steps to reduce your exposure to pollen during allergy seasons. Do not hang sheets or clothing out to dry; pollen may collect on these items. Do not mow lawns or spend time around freshly cut grass; mowing stirs up pollen. Keep windows closed at night.  Keep car windows closed while driving. Minimize morning activities outdoors, a time when pollen counts are usually at their highest. Stay indoors as much as possible when pollen counts or humidity is high and on windy days when pollen tends to remain in the air longer. Use air conditioning when possible.  Many air  conditioners have filters that trap the pollen spores. Use a HEPA room air filter to remove pollen form the indoor air you breathe. Control of Mold Allergen Mold and fungi can grow on a variety of surfaces provided certain temperature and moisture conditions exist.  Outdoor molds grow on plants, decaying vegetation and soil.  The major outdoor  mold, Alternaria and Cladosporium, are found in very high numbers during hot and dry conditions.  Generally, a late Summer - Fall peak is seen for common outdoor fungal spores.  Rain will temporarily lower outdoor mold spore count, but counts rise rapidly when the rainy period ends.  The most important indoor molds are Aspergillus and Penicillium.  Dark, humid and poorly ventilated basements are ideal sites for mold growth.  The next most common sites of mold growth are the bathroom and the kitchen.  Outdoor [employer] Use air conditioning and keep windows closed Avoid exposure to decaying vegetation. Avoid leaf raking. Avoid grain handling. Consider wearing a face mask if working in moldy areas.  Indoor Mold Control Maintain humidity below 50%. Clean washable surfaces with 5% bleach solution. Remove sources e.g. Contaminated carpets.    Control of Dust Mite Allergen Dust mites play a major role in allergic asthma and rhinitis. They occur in environments with high humidity wherever human skin is found. Dust mites absorb humidity from the atmosphere (ie, they do not drink) and feed on organic matter (including shed human and animal skin). Dust mites are a microscopic type of insect that you cannot see with the naked eye. High levels of dust mites have been detected from mattresses, pillows, carpets, upholstered furniture, bed covers, clothes, soft toys and any woven material. The principal allergen of the dust mite is found in its feces. A gram of dust may contain 1,000 mites and 250,000 fecal particles. Mite antigen is easily measured in the air during house cleaning activities. Dust mites do not bite and do not cause harm to humans, other than by triggering allergies/asthma.  Ways to decrease your exposure to dust mites in your home:  1. Encase mattresses, box springs and pillows with a mite-impermeable barrier or cover  2. Wash sheets, blankets and drapes weekly in hot water (130 F) with  detergent and dry them in a dryer on the hot setting.  3. Have the room cleaned frequently with a vacuum cleaner and a damp dust-mop. For carpeting or rugs, vacuuming with a vacuum cleaner equipped with a high-efficiency particulate air (HEPA) filter. The dust mite allergic individual should not be in a room which is being cleaned and should wait 1 hour after cleaning before going into the room.  4. Do not sleep on upholstered furniture (eg, couches).  5. If possible removing carpeting, upholstered furniture and drapery from the home is ideal. Horizontal blinds should be eliminated in the rooms where the person spends the most time (bedroom, study, television room). Washable vinyl, roller-type shades are optimal.  6. Remove all non-washable stuffed toys from the bedroom. Wash stuffed toys weekly like sheets and blankets above.  7. Reduce indoor humidity to less than 50%. Inexpensive humidity monitors can be purchased at most hardware stores. Do not use a humidifier as can make the problem worse and are not recommended.

## 2023-07-10 ENCOUNTER — Other Ambulatory Visit: Payer: Self-pay

## 2023-07-10 ENCOUNTER — Telehealth: Payer: Self-pay | Admitting: Internal Medicine

## 2023-07-10 DIAGNOSIS — N401 Enlarged prostate with lower urinary tract symptoms: Secondary | ICD-10-CM

## 2023-07-10 MED ORDER — FINASTERIDE 5 MG PO TABS: 5.0000 mg | ORAL_TABLET | Freq: Every day | ORAL | 1 refills | Status: DC

## 2023-07-10 NOTE — Telephone Encounter (Signed)
Refills sent to pharmacy. 

## 2023-07-10 NOTE — Telephone Encounter (Signed)
Prescription Request  07/10/2023  LOV: 05/28/2023  What is the name of the medication or equipment? finasteride (PROSCAR) 5 MG tablet [[id number]]  90 DAY SUPPLY   PT HAS 3 LEFT   Have you contacted your pharmacy to request a refill? Yes   Which pharmacy would you like this sent to?  [facility] [address] [place] - [address] [place] [address] Phone: [phone] Fax: [phone]     Patient notified that their request is being sent to the clinical staff for review and that they should receive a response within 2 business days.   Please advise at Mobile [phone] (mobile)

## 2023-07-23 DIAGNOSIS — Z8719 Personal history of other diseases of the digestive system: Secondary | ICD-10-CM | POA: Diagnosis not present

## 2023-07-23 DIAGNOSIS — K219 Gastro-esophageal reflux disease without esophagitis: Secondary | ICD-10-CM | POA: Diagnosis not present

## 2023-07-23 DIAGNOSIS — K580 Irritable bowel syndrome with diarrhea: Secondary | ICD-10-CM | POA: Diagnosis not present

## 2023-07-25 DIAGNOSIS — M1712 Unilateral primary osteoarthritis, left knee: Secondary | ICD-10-CM | POA: Diagnosis not present

## 2023-07-30 DIAGNOSIS — M1712 Unilateral primary osteoarthritis, left knee: Secondary | ICD-10-CM | POA: Diagnosis not present

## 2023-08-01 ENCOUNTER — Ambulatory Visit: Payer: Medicare PPO | Attending: Nurse Practitioner | Admitting: Nurse Practitioner

## 2023-08-01 ENCOUNTER — Encounter: Payer: Self-pay | Admitting: Nurse Practitioner

## 2023-08-01 VITALS — BP 118/70 | HR 71 | Ht 71.0 in | Wt 202.4 lb

## 2023-08-01 DIAGNOSIS — R0989 Other specified symptoms and signs involving the circulatory and respiratory systems: Secondary | ICD-10-CM

## 2023-08-01 DIAGNOSIS — I25119 Atherosclerotic heart disease of native coronary artery with unspecified angina pectoris: Secondary | ICD-10-CM

## 2023-08-01 DIAGNOSIS — R2689 Other abnormalities of gait and mobility: Secondary | ICD-10-CM

## 2023-08-01 DIAGNOSIS — I5032 Chronic diastolic (congestive) heart failure: Secondary | ICD-10-CM

## 2023-08-01 DIAGNOSIS — E785 Hyperlipidemia, unspecified: Secondary | ICD-10-CM

## 2023-08-01 DIAGNOSIS — R0609 Other forms of dyspnea: Secondary | ICD-10-CM

## 2023-08-01 DIAGNOSIS — R0789 Other chest pain: Secondary | ICD-10-CM | POA: Diagnosis not present

## 2023-08-01 DIAGNOSIS — R600 Localized edema: Secondary | ICD-10-CM | POA: Diagnosis not present

## 2023-08-01 DIAGNOSIS — Z79899 Other long term (current) drug therapy: Secondary | ICD-10-CM

## 2023-08-01 MED ORDER — FUROSEMIDE 20 MG PO TABS: 20.0000 mg | ORAL_TABLET | Freq: Every day | ORAL | 1 refills | Status: DC | PRN

## 2023-08-01 NOTE — Patient Instructions (Addendum)
Medication Instructions:  Your physician has recommended you make the following change in your medication:  Please start Lasix 20 Mg daily as needed for swelling   Labwork: In 1 week at [facility]   Testing/Procedures: A chest x-ray takes a picture of the organs and structures inside the chest, including the heart, lungs, and blood vessels. This test can show several things, including, whether the heart is enlarges; whether fluid is building up in the lungs; and whether pacemaker / defibrillator leads are still in place. Your physician has requested that you have an echocardiogram. Echocardiography is a painless test that uses sound waves to create images of your heart. It provides your doctor with information about the size and shape of your heart and how well your heart's chambers and valves are working. This procedure takes approximately one hour. There are no restrictions for this procedure. Please do NOT wear cologne, perfume, aftershave, or lotions (deodorant is allowed). Please arrive 15 minutes prior to your appointment time.  Please note: We ask at that you not bring children with you during ultrasound (echo/ vascular) testing. Due to room size and safety concerns, children are not allowed in the ultrasound rooms during exams. Our front office staff cannot provide observation of children in our lobby area while testing is being conducted. An adult accompanying a patient to their appointment will only be allowed in the ultrasound room at the discretion of the ultrasound technician under special circumstances. We apologize for any inconvenience.  Follow-Up: Your physician recommends that you schedule a follow-up appointment in: 8 weeks   Any Other Special Instructions Will Be Listed Below (If Applicable).  If you need a refill on your cardiac medications before your next appointment, please call your pharmacy.

## 2023-08-01 NOTE — Progress Notes (Unsigned)
Cardiology Office Note:  .   Date:  03/28/2023 ID:  [name], DOB [birth year], MRN [id number] PCP: Anabel Halon, MD  [facility] Providers Cardiologist:  Nona Dell, MD    History of Present Illness: .   Lydell Grimstead is a 82 y.o. 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   Last seen by Dr. Diona Browner on December 25, 2022. Admitted to URI symptoms over the past month, saw his PCP. 2 view CXR was obtained and was negative, was encouraged to f/u with PCP.   Today he presents for follow-up with his wife. He states he continues to note stable dyspnea on exertion, continues to note URI symptoms, has cough and congestion, particularly points to left upper chest after he coughs during interview. Has recently seen his allergist. Wanting to know if he should undergo cardiac catheterization. Denies any chest pain, palpitations, syncope, presyncope, dizziness, orthopnea, PND, swelling or significant weight changes, acute bleeding, or claudication.  Equilibrium is off.... worse now... but has been going on for a while  Almost fell 4 times today. Hands and legs cramp a lot.   Left upper abdomen, and shoots up left upper chest, doesn't last long, feels like someone is ripping me... been ongoing for 1 month.   Breathing is better... new inhaler and nose spray.   No stamina... five steps in and he is short of breath.    Update Echocardiogram.  Jeani Hawking. No to Pulm referral.    Every night leg swelling.    Lasix 20 mg daily PRN for leg swelling. 7-10 days  - CMET, Mag. Jeani Hawking.  Update Echo CXR - fine crackles left lower lobe.  Follow-up in 8 weeks.    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   No ST deviation was noted from baseline EKG. The ECG was not diagnostic due to pharmacologic protocol.   The patient  reported dyspnea during the stress test. Normal blood pressure and normal heart rate response noted during stress. Heart rate recovery was normal.   LV perfusion is abnormal. There is no evidence of ischemia. There is no evidence of infarction. Defect 1: There is a medium defect with mild reduction in uptake present in the basal inferior and inferolateral location(s) that is fixed. Consistent with artifact caused by diaphragmatic attenuation.   Evidence of transient ischemic dilation (TID) noted. TID was appreciated visually and quantitatively.  This may indicate balanced ischemia due to multivessel CAD.   Left ventricular function is normal.   Consider coronary CTA if clinically indicated.  Echo 09/2021:  1. Left ventricular ejection fraction, by estimation, is 60 to 65%. The  left ventricle has normal function. The left ventricle has no regional  wall motion abnormalities. Left ventricular diastolic parameters are  consistent with Grade I diastolic  dysfunction (impaired relaxation).   2. Right ventricular systolic function is normal. The right ventricular  size is normal. Tricuspid regurgitation signal is inadequate for assessing  PA pressure.   3. The mitral valve is grossly normal. Trivial mitral valve  regurgitation.   4. The aortic valve is tricuspid. Aortic valve regurgitation is not  visualized. Aortic valve sclerosis/calcification is present, without any  evidence of aortic stenosis.   5. Aortic dilatation noted. There is borderline dilatation of the aortic  root, measuring 39 mm.  6. Prominent epicardial fat layer.   Comparison(s): Prior images reviewed side by side. LVEF remains normal at  60-65% with mild diastolic dysfunction.     Physical Exam:   VS:  There were no vitals taken for this visit.   Wt Readings from Last 3 Encounters:  07/04/23 204 lb 3.2 oz (92.6 kg)  05/28/23 198 lb 3.2 oz (89.9 kg)  04/04/23 195 lb 3.2 oz (88.5 kg)    GEN: Well nourished, well  developed in no acute distress NECK: No JVD; No carotid bruits CARDIAC: S1/S2, RRR, no murmurs, rubs, gallops RESPIRATORY:  Clear to auscultation without rales, wheezing or rhonchi, strong bronchitic cough on exam ABDOMEN: Soft, non-tender, non-distended EXTREMITIES:  No edema; No deformity   ASSESSMENT AND PLAN: .    CAD, s/p CABG NST 06/2022 revealed mild ischemia along basal inferior wall. Denies any chest pain. Has not has official pulmonary evaluation which his symptoms appear to be in etiology, see below. Not on aspirin d/t previous hx of GI bleeding on low dose Aspirin. Continue Repatha, Lopressor, and will initiate NTG PRN for chest pain. Heart healthy diet encouraged. ED precautions discussed.   DOE Chronic since 2023, hx of Covid-19 and continues to note URI symptoms. Cough on exam sounds like bronchitis. Past 2 view CXR negative for anything acute. Agree with Dr. Diona Browner that he would benefit from referral to Pulmonary. Will make referral for him. If no improvement in symptoms by next follow-up, plan to discuss/consider invasive cardiac testing.   HLD Tolerating Repatha well. LDL 11/2022 26. Continue to follow-up with Dr. Rennis Golden.   Dispo: Follow-up with me or APP in 3 months or sooner if anything changes.   Signed, Sharlene Dory, NP

## 2023-08-03 DIAGNOSIS — J479 Bronchiectasis, uncomplicated: Secondary | ICD-10-CM | POA: Diagnosis not present

## 2023-08-03 DIAGNOSIS — J449 Chronic obstructive pulmonary disease, unspecified: Secondary | ICD-10-CM | POA: Diagnosis not present

## 2023-08-18 ENCOUNTER — Other Ambulatory Visit (HOSPITAL_COMMUNITY): Payer: Self-pay

## 2023-08-18 ENCOUNTER — Telehealth: Payer: Self-pay | Admitting: Pharmacy Technician

## 2023-08-18 NOTE — Telephone Encounter (Signed)
Pharmacy Patient Advocate Encounter   Received notification from Fax that prior authorization for repatha is required/requested.   Insurance verification completed.   The patient is insured through [employer] .   Per test claim: PA required; PA submitted to above mentioned insurance via CoverMyMeds Key/confirmation #/EOC [id number] Status is pending

## 2023-08-19 ENCOUNTER — Other Ambulatory Visit (HOSPITAL_COMMUNITY): Payer: Self-pay

## 2023-08-22 ENCOUNTER — Other Ambulatory Visit (HOSPITAL_COMMUNITY): Payer: Self-pay

## 2023-08-23 ENCOUNTER — Other Ambulatory Visit (HOSPITAL_COMMUNITY): Payer: Self-pay

## 2023-08-25 ENCOUNTER — Ambulatory Visit (HOSPITAL_COMMUNITY)
Admission: RE | Admit: 2023-08-25 | Discharge: 2023-08-25 | Disposition: A | Discharge status: Home / Self Care | Payer: Medicare PPO | Source: Ambulatory Visit | Attending: Nurse Practitioner | Admitting: Nurse Practitioner

## 2023-08-25 ENCOUNTER — Other Ambulatory Visit (HOSPITAL_COMMUNITY)
Admission: RE | Admit: 2023-08-25 | Discharge: 2023-08-25 | Disposition: A | Discharge status: Home / Self Care | Payer: Medicare PPO | Source: Ambulatory Visit | Attending: Nurse Practitioner | Admitting: Nurse Practitioner

## 2023-08-25 DIAGNOSIS — I25119 Atherosclerotic heart disease of native coronary artery with unspecified angina pectoris: Secondary | ICD-10-CM

## 2023-08-25 DIAGNOSIS — R0989 Other specified symptoms and signs involving the circulatory and respiratory systems: Secondary | ICD-10-CM | POA: Insufficient documentation

## 2023-08-25 DIAGNOSIS — R0609 Other forms of dyspnea: Secondary | ICD-10-CM | POA: Diagnosis not present

## 2023-08-25 DIAGNOSIS — Z79899 Other long term (current) drug therapy: Secondary | ICD-10-CM

## 2023-08-25 LAB — BASIC METABOLIC PANEL
Anion gap: 9 (ref 5–15)
BUN: 17 mg/dL (ref 8–23)
CO2: 25 mmol/L (ref 22–32)
Calcium: 9.3 mg/dL (ref 8.9–10.3)
Chloride: 107 mmol/L (ref 98–111)
Creatinine, Ser: 1.1 mg/dL (ref 0.61–1.24)
GFR, Estimated: >60 mL/min (ref >60)
Glucose, Bld: 115 mg/dL — ABNORMAL HIGH (ref 70–99)
Potassium: 3.6 mmol/L (ref 3.5–5.1)
Sodium: 141 mmol/L (ref 135–145)

## 2023-08-25 LAB — ECHOCARDIOGRAM COMPLETE
Area-P 1/2: 3.12 cm2
S' Lateral: 2.3 cm

## 2023-08-25 LAB — MAGNESIUM: Magnesium: 2 mg/dL (ref 1.7–2.4)

## 2023-08-25 NOTE — Progress Notes (Signed)
*  PRELIMINARY RESULTS* Echocardiogram 2D Echocardiogram has been performed.  Benjamin [name] 08/25/2023, 3:54 PM

## 2023-08-27 ENCOUNTER — Other Ambulatory Visit (HOSPITAL_COMMUNITY): Payer: Self-pay

## 2023-08-27 DIAGNOSIS — M1712 Unilateral primary osteoarthritis, left knee: Secondary | ICD-10-CM | POA: Diagnosis not present

## 2023-08-28 ENCOUNTER — Other Ambulatory Visit (HOSPITAL_COMMUNITY): Payer: Self-pay

## 2023-08-29 NOTE — Telephone Encounter (Signed)
Pharmacy Patient Advocate Encounter  Received notification from [facility] that Prior Authorization for repatha has been APPROVED from 07/29/23 to 08/27/24   PA #/Case ID/Reference #: [id number]

## 2023-09-01 ENCOUNTER — Encounter: Payer: Self-pay | Admitting: Urology

## 2023-09-01 ENCOUNTER — Ambulatory Visit: Payer: Medicare PPO | Admitting: Urology

## 2023-09-01 VITALS — BP 109/72 | HR 75

## 2023-09-01 DIAGNOSIS — R3912 Poor urinary stream: Secondary | ICD-10-CM

## 2023-09-01 DIAGNOSIS — N401 Enlarged prostate with lower urinary tract symptoms: Secondary | ICD-10-CM

## 2023-09-01 DIAGNOSIS — N138 Other obstructive and reflux uropathy: Secondary | ICD-10-CM

## 2023-09-01 NOTE — Progress Notes (Signed)
PVR- 41
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
## 2023-09-02 DIAGNOSIS — J449 Chronic obstructive pulmonary disease, unspecified: Secondary | ICD-10-CM | POA: Diagnosis not present

## 2023-09-02 DIAGNOSIS — J479 Bronchiectasis, uncomplicated: Secondary | ICD-10-CM | POA: Diagnosis not present

## 2023-09-04 ENCOUNTER — Telehealth: Payer: Self-pay | Admitting: Internal Medicine

## 2023-09-04 MED ORDER — REPATHA SURECLICK 140 MG/ML ~~LOC~~ SOAJ: 140.0000 mg | SUBCUTANEOUS | 11 refills | Status: DC

## 2023-09-04 NOTE — Telephone Encounter (Signed)
*  STAT* If patient is at the pharmacy, call can be transferred to refill team.   1. Which medications need to be refilled? (please list name of each medication and dose if known) Repatha   2. Would you like to learn more about the convenience, safety, & potential cost savings by using the [facility]?     3. Are you open to using the [facility] (Type [facility].    4. Which pharmacy/location (including street and city if local pharmacy) is medication to be sent to? [facility]    5. Do they need a 30 day or 90 day supply?

## 2023-09-19 ENCOUNTER — Ambulatory Visit: Payer: Medicare PPO | Attending: Nurse Practitioner | Admitting: Nurse Practitioner

## 2023-09-19 ENCOUNTER — Telehealth: Payer: Self-pay | Admitting: Nurse Practitioner

## 2023-09-19 ENCOUNTER — Encounter: Payer: Self-pay | Admitting: Nurse Practitioner

## 2023-09-19 VITALS — BP 108/62 | HR 72 | Ht 70.0 in | Wt 205.2 lb

## 2023-09-19 DIAGNOSIS — R0609 Other forms of dyspnea: Secondary | ICD-10-CM

## 2023-09-19 DIAGNOSIS — E785 Hyperlipidemia, unspecified: Secondary | ICD-10-CM

## 2023-09-19 DIAGNOSIS — I2510 Atherosclerotic heart disease of native coronary artery without angina pectoris: Secondary | ICD-10-CM

## 2023-09-19 DIAGNOSIS — J449 Chronic obstructive pulmonary disease, unspecified: Secondary | ICD-10-CM

## 2023-09-19 NOTE — Patient Instructions (Addendum)
Medication Instructions:  Your physician recommends that you continue on your current medications as directed. Please refer to the Current Medication list given to you today.   Labwork: None  Testing/Procedures: Your physician has recommended that you have a pulmonary function test. Pulmonary Function Tests are a group of tests that measure how well air moves in and out of your lungs.   Follow-Up: Your physician recommends that you schedule a follow-up appointment in: 6 months  Any Other Special Instructions Will Be Listed Below (If Applicable).  If you need a refill on your cardiac medications before your next appointment, please call your pharmacy.

## 2023-09-19 NOTE — Telephone Encounter (Signed)
 Checking percert on the following patient for testing scheduled at [facility].    PFT'S COMPLETE   11/04/2023

## 2023-09-19 NOTE — Progress Notes (Signed)
 Cardiology Office Note:  .   Date:  09/19/2023  ID:  Benjamin [name], DOB July 11, 1941, MRN [id number] PCP: Tobie Suzzane POUR, MD  [facility] Providers Cardiologist:  Jayson Sierras, MD    History of Present Illness: .   Benjamin [name] is a 83 y.o. 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   Last seen by Dr. Sierras on December 25, 2022. Admitted to URI symptoms over the past month, saw his PCP. 2 view CXR was obtained and was negative, was encouraged to f/u with PCP.   08/01/2023 - Today he presents for follow-up. Says equilibrium is off, wife says this has been ongoing for a while, but symptoms have been worse recently. Pt says he almost fell 4 times today, and says hands and legs cramp a lot. Describes atypical chest pain, as shooting pain from left upper abdomen to left upper chest, brief in duration, has been ongoing for 1 month. Breathing has improved after getting a new inhaler and nose spray. However, wife says pt has no stamina. Takes five steps in, and he is short of breath. Denies any palpitations, syncope, presyncope, dizziness, orthopnea, PND, or significant weight changes, acute bleeding, or claudication. Has leg swelling nightly. Pt did not get evaluated by Pulmonology, pt refuses to be seen by Pulmonology. CXR previously arranged was WNL.   09/19/2023 - Presents for follow-up with his wife. Continues to note stable DOE, particularly noticed when going upstairs. Denies any chest pain, palpitations, syncope, presyncope, dizziness, orthopnea, PND, swelling or significant weight changes, acute bleeding, or claudication.  ROS: Negative. See HPI.   Studies Reviewed: SABRA    EKG: EKG is not ordered today.   Echo 08/2023:  1. Left ventricular ejection fraction, by estimation, is 60 to 65%. The  left ventricle has normal function. The left ventricle has no regional  wall motion  abnormalities. Left ventricular diastolic parameters are  consistent with Grade I diastolic  dysfunction (impaired relaxation).   2. Right ventricular systolic function is normal. The right ventricular  size is normal. Tricuspid regurgitation signal is inadequate for assessing PA pressure.   3. The mitral valve is grossly normal. Trivial mitral valve  regurgitation.   4. The aortic valve is tricuspid. Aortic valve regurgitation is not  visualized.   5. Aortic dilatation noted. There is borderline dilatation of the aortic  root, measuring 39 mm.   6. The inferior vena cava is normal in size with greater than 50%  respiratory variability, suggesting right atrial pressure of 3 mmHg.   Comparison(s): No significant change from prior study. Prior images  reviewed side by side.  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   No ST deviation was noted from baseline EKG. The ECG was not diagnostic due to pharmacologic protocol.   The patient reported dyspnea during the stress test. Normal blood pressure and normal heart rate response noted during stress. Heart rate recovery was normal.   LV perfusion is abnormal. There is no evidence of ischemia. There is no evidence of infarction. Defect 1: There is a medium defect with mild reduction in uptake present in the basal inferior and inferolateral location(s) that is fixed. Consistent with artifact caused by diaphragmatic attenuation.   Evidence of transient ischemic dilation (TID) noted. TID was appreciated visually and quantitatively.  This may indicate balanced ischemia due to multivessel CAD.  Left ventricular function is normal.   Consider coronary CTA if clinically indicated.  Echo 09/2021:  1. Left ventricular ejection fraction, by estimation, is 60 to 65%. The  left ventricle has normal function. The left ventricle has no regional  wall motion abnormalities. Left ventricular diastolic parameters are  consistent  with Grade I diastolic  dysfunction (impaired relaxation).   2. Right ventricular systolic function is normal. The right ventricular  size is normal. Tricuspid regurgitation signal is inadequate for assessing  PA pressure.   3. The mitral valve is grossly normal. Trivial mitral valve  regurgitation.   4. The aortic valve is tricuspid. Aortic valve regurgitation is not  visualized. Aortic valve sclerosis/calcification is present, without any  evidence of aortic stenosis.   5. Aortic dilatation noted. There is borderline dilatation of the aortic  root, measuring 39 mm.   6. Prominent epicardial fat layer.   Comparison(s): Prior images reviewed side by side. LVEF remains normal at  60-65% with mild diastolic dysfunction.     Physical Exam:   VS:  BP 108/62   Pulse 72   Ht 5' 10 (1.778 m)   Wt 205 lb 3.2 oz (93.1 kg)   SpO2 92%   BMI 29.44 kg/m    Wt Readings from Last 3 Encounters:  09/19/23 205 lb 3.2 oz (93.1 kg)  08/01/23 202 lb 6.4 oz (91.8 kg)  07/04/23 204 lb 3.2 oz (92.6 kg)    GEN: Well nourished, well developed in no acute distress NECK: No JVD; No carotid bruits CARDIAC: S1/S2, RRR, no murmurs, rubs, gallops RESPIRATORY:  Clear and diminished to auscultation without rales, wheezing or rhonchi EXTREMITIES:  No edema; No deformity   ASSESSMENT AND PLAN: .    CAD, s/p CABG NST 06/2022 revealed mild ischemia along basal inferior wall. Denies any chest pain. No indication for ischemic evaluation at this time. Has not had official pulmonary evaluation which his symptoms appear to be in etiology, see below. Not on aspirin  d/t previous hx of GI bleeding on low dose Aspirin . Continue Repatha , Lopressor , and NTG PRN. Heart healthy diet encouraged. ED precautions discussed.   DOE,  Chronic since 2023, hx of Covid-19. Recent CXR WNL. Will arrange PFT's as this appears to be related to his hx asthma/COPD. If testing reveals obstructive lung disease will provide referral to  Pulmonology. Continue to follow with PCP. Care and ED precautions discussed.   HLD Tolerating Repatha  well. LDL 11/2022 26. Continue to follow-up with Dr. Mona.    Dispo:  Follow-up with Dr. Debera or [facility] in 6 months or sooner if anything changes.   Signed, Almarie Crate, NP

## 2023-09-26 ENCOUNTER — Ambulatory Visit: Payer: Medicare PPO | Admitting: Nurse Practitioner

## 2023-10-01 DIAGNOSIS — H903 Sensorineural hearing loss, bilateral: Secondary | ICD-10-CM | POA: Diagnosis not present

## 2023-10-03 DIAGNOSIS — J479 Bronchiectasis, uncomplicated: Secondary | ICD-10-CM | POA: Diagnosis not present

## 2023-10-03 DIAGNOSIS — J449 Chronic obstructive pulmonary disease, unspecified: Secondary | ICD-10-CM | POA: Diagnosis not present

## 2023-10-04 ENCOUNTER — Other Ambulatory Visit: Payer: Self-pay | Admitting: Cardiology

## 2023-10-04 ENCOUNTER — Other Ambulatory Visit: Payer: Self-pay | Admitting: Internal Medicine

## 2023-10-04 DIAGNOSIS — K219 Gastro-esophageal reflux disease without esophagitis: Secondary | ICD-10-CM

## 2023-10-07 ENCOUNTER — Ambulatory Visit (INDEPENDENT_AMBULATORY_CARE_PROVIDER_SITE_OTHER): Payer: Medicare PPO

## 2023-10-07 VITALS — Ht 70.0 in | Wt 182.8 lb

## 2023-10-07 DIAGNOSIS — Z0000 Encounter for general adult medical examination without abnormal findings: Secondary | ICD-10-CM | POA: Diagnosis not present

## 2023-10-07 DIAGNOSIS — Z2821 Immunization not carried out because of patient refusal: Secondary | ICD-10-CM

## 2023-10-07 NOTE — Patient Instructions (Signed)
[name] , Thank you for taking time to come for your Medicare Wellness Visit. I appreciate your ongoing commitment to your health goals. Please review the following plan we discussed and let me know if I can assist you in the future.   Referrals/Orders/Follow-Ups/Clinician Recommendations:   Next Medicare Annual Wellness Visit: October 11, 2024 at 3:10 virtual visit  This is a list of the screening recommended for you and due dates:  Health Maintenance  Topic Date Due   Zoster (Shingles) Vaccine (1 of 2) 02/22/1960   COVID-19 Vaccine (3 - Pfizer risk series) 01/09/2020   Eye exam for diabetics  10/30/2023   Hemoglobin A1C  11/20/2023   Yearly kidney health urinalysis for diabetes  11/22/2023   Complete foot exam   11/22/2023   Yearly kidney function blood test for diabetes  08/24/2024   Medicare Annual Wellness Visit  10/06/2024   DTaP/Tdap/Td vaccine (4 - Td or Tdap) 03/31/2027   Pneumonia Vaccine  Completed   Flu Shot  Completed   HPV Vaccine  Aged Out   Screening for Lung Cancer  Discontinued   Hepatitis C Screening  Discontinued    Advanced directives: (Declined) Advance directive discussed with you today. Even though you declined this today, please call our office should you change your mind, and we can give you the proper paperwork for you to fill out.  Next Medicare Annual Wellness Visit scheduled for next year: yes  Preventive Care 70 Years and Older, Male Preventive care refers to lifestyle choices and visits with your health care provider that can promote health and wellness. Preventive care visits are also called wellness exams. What can I expect for my preventive care visit? Counseling During your preventive care visit, your health care provider may ask about your: Medical history, including: Past medical problems. Family medical history. History of falls. Current health, including: Emotional well-being. Home life and relationship well-being. Sexual  activity. Memory and ability to understand (cognition). Lifestyle, including: Alcohol, nicotine or tobacco, and drug use. Access to firearms. Diet, exercise, and sleep habits. Work and work Astronomer. Sunscreen use. Safety issues such as seatbelt and bike helmet use. Physical exam Your health care provider will check your: Height and weight. These may be used to calculate your BMI (body mass index). BMI is a measurement that tells if you are at a healthy weight. Waist circumference. This measures the distance around your waistline. This measurement also tells if you are at a healthy weight and may help predict your risk of certain diseases, such as type 2 diabetes and high blood pressure. Heart rate and blood pressure. Body temperature. Skin for abnormal spots. What immunizations do I need?  Vaccines are usually given at various ages, according to a schedule. Your health care provider will recommend vaccines for you based on your age, medical history, and lifestyle or other factors, such as travel or where you work. What tests do I need? Screening Your health care provider may recommend screening tests for certain conditions. This may include: Lipid and cholesterol levels. Diabetes screening. This is done by checking your blood sugar (glucose) after you have not eaten for a while (fasting). Hepatitis C test. Hepatitis B test. HIV (human immunodeficiency virus) test. STI (sexually transmitted infection) testing, if you are at risk. Lung cancer screening. Colorectal cancer screening. Prostate cancer screening. Abdominal aortic aneurysm (AAA) screening. You may need this if you are a current or former smoker. Talk with your health care provider about your test results, treatment options,  and if necessary, the need for more tests. Follow these instructions at home: Eating and drinking  Eat a diet that includes fresh fruits and vegetables, whole grains, lean protein, and low-fat  dairy products. Limit your intake of foods with high amounts of sugar, saturated fats, and salt. Take vitamin and mineral supplements as recommended by your health care provider. Do not drink alcohol if your health care provider tells you not to drink. If you drink alcohol: Limit how much you have to 0-2 drinks a day. Know how much alcohol is in your drink. In the [place] one drink equals one 12 oz bottle of beer (355 mL), one 5 oz glass of wine (148 mL), or one 1 oz glass of hard liquor (44 mL). Lifestyle Brush your teeth every morning and night with fluoride toothpaste. Floss one time each day. Exercise for at least 30 minutes 5 or more days each week. Do not use any products that contain nicotine or tobacco. These products include cigarettes, chewing tobacco, and vaping devices, such as e-cigarettes. If you need help quitting, ask your health care provider. Do not use drugs. If you are sexually active, practice safe sex. Use a condom or other form of protection to prevent STIs. Take aspirin only as told by your health care provider. Make sure that you understand how much to take and what form to take. Work with your health care provider to find out whether it is safe and beneficial for you to take aspirin daily. Ask your health care provider if you need to take a cholesterol-lowering medicine (statin). Find healthy ways to manage stress, such as: Meditation, yoga, or listening to music. Journaling. Talking to a trusted person. Spending time with friends and family. Safety Always wear your seat belt while driving or riding in a vehicle. Do not drive: If you have been drinking alcohol. Do not ride with someone who has been drinking. When you are tired or distracted. While texting. If you have been using any mind-altering substances or drugs. Wear a helmet and other protective equipment during sports activities. If you have firearms in your house, make sure you follow all gun safety  procedures. Minimize exposure to UV radiation to reduce your risk of skin cancer. What's next? Visit your health care provider once a year for an annual wellness visit. Ask your health care provider how often you should have your eyes and teeth checked. Stay up to date on all vaccines. This information is not intended to replace advice given to you by your health care provider. Make sure you discuss any questions you have with your health care provider. Document Revised: 02/28/2021 Document Reviewed: 02/28/2021 Elsevier Patient Education  2024 [employer]. Understanding Your Risk for Falls Millions of people have serious injuries from falls each year. It is important to understand your risk of falling. Talk with your health care provider about your risk and what you can do to lower it. If you do have a serious fall, make sure to tell your provider. Falling once raises your risk of falling again. How can falls affect me? Serious injuries from falls are common. These include: Broken bones, such as hip fractures. Head injuries, such as traumatic brain injuries (TBI) or concussions. A fear of falling can cause you to avoid activities and stay at home. This can make your muscles weaker and raise your risk for a fall. What can increase my risk? There are a number of risk factors that increase your risk for falling. The  more risk factors you have, the higher your risk of falling. Serious injuries from a fall happen most often to people who are older than 82 years old. Teenagers and young adults ages 23-29 are also at higher risk. Common risk factors include: Weakness in the lower body. Being generally weak or confused due to long-term (chronic) illness. Dizziness or balance problems. Poor vision. Medicines that cause dizziness or drowsiness. These may include: Medicines for your blood pressure, heart, anxiety, insomnia, or swelling (edema). Pain medicines. Muscle relaxants. Other risk factors  include: Drinking alcohol. Having had a fall in the past. Having foot pain or wearing improper footwear. Working at a dangerous job. Having any of the following in your home: Tripping hazards, such as floor clutter or loose rugs. Poor lighting. Pets. Having dementia or memory loss. What actions can I take to lower my risk of falling?     Physical activity Stay physically fit. Do strength and balance exercises. Consider taking a regular class to build strength and balance. Yoga and tai chi are good options. Vision Have your eyes checked every year and your prescription for glasses or contacts updated as needed. Shoes and walking aids Wear non-skid shoes. Wear shoes that have rubber soles and low heels. Do not wear high heels. Do not walk around the house in socks or slippers. Use a cane or walker as told by your provider. Home safety Attach secure railings on both sides of your stairs. Install grab bars for your bathtub, shower, and toilet. Use a non-skid mat in your bathtub or shower. Attach bath mats securely with double-sided, non-slip rug tape. Use good lighting in all rooms. Keep a flashlight near your bed. Make sure there is a clear path from your bed to the bathroom. Use night-lights. Do not use throw rugs. Make sure all carpeting is taped or tacked down securely. Remove all clutter from walkways and stairways, including extension cords. Repair uneven or broken steps and floors. Avoid walking on icy or slippery surfaces. Walk on the grass instead of on icy or slick sidewalks. Use ice melter to get rid of ice on walkways in the winter. Use a cordless phone. Questions to ask your health care provider Can you help me check my risk for a fall? Do any of my medicines make me more likely to fall? Should I take a vitamin D supplement? What exercises can I do to improve my strength and balance? Should I make an appointment to have my vision checked? Do I need a bone density test  to check for weak bones (osteoporosis)? Would it help to use a cane or a walker? Where to find more information Centers for Disease Control and Prevention, STEAD[employer]: TonerPromos.no Community-Based Fall Prevention Programs: TonerPromos.no [employer] on Aging: BaseRingTones.pl Contact a health care provider if: You fall at home. You are afraid of falling at home. You feel weak, drowsy, or dizzy. This information is not intended to replace advice given to you by your health care provider. Make sure you discuss any questions you have with your health care provider. Document Revised: 05/06/2022 Document Reviewed: 05/06/2022 Elsevier Patient Education  2024 [employer] Inc[employer]   Managing Pain Without Opioids Opioids are strong medicines used to treat moderate to severe pain. For some people, especially those who have long-term (chronic) pain, opioids may not be the best choice for pain management due to: Side effects like nausea, constipation, and sleepiness. The risk of addiction (opioid use disorder). The longer you take opioids, the greater your  risk of addiction. Pain that lasts for more than 3 months is called chronic pain. Managing chronic pain usually requires more than one approach and is often provided by a team of health care providers working together (multidisciplinary approach). Pain management may be done at a pain management center or pain clinic. How to manage pain without the use of opioids Use non-opioid medicines Non-opioid medicines for pain may include: Over-the-counter or prescription non-steroidal anti-inflammatory drugs (NSAIDs). These may be the first medicines used for pain. They work well for muscle and bone pain, and they reduce swelling. Acetaminophen. This over-the-counter medicine may work well for milder pain but not swelling. Antidepressants. These may be used to treat chronic pain. A certain type of antidepressant (tricyclics) is often used. These medicines are given in lower doses  for pain than when used for depression. Anticonvulsants. These are usually used to treat seizures but may also reduce nerve (neuropathic) pain. Muscle relaxants. These relieve pain caused by sudden muscle tightening (spasms). You may also use a pain medicine that is applied to the skin as a patch, cream, or gel (topical analgesic), such as a numbing medicine. These may cause fewer side effects than medicines taken by mouth. Do certain therapies as directed Some therapies can help with pain management. They include: Physical therapy. You will do exercises to gain strength and flexibility. A physical therapist may teach you exercises to move and stretch parts of your body that are weak, stiff, or painful. You can learn these exercises at physical therapy visits and practice them at home. Physical therapy may also involve: Massage. Heat wraps or applying heat or cold to affected areas. Electrical signals that interrupt pain signals (transcutaneous electrical nerve stimulation, TENS). Weak lasers that reduce pain and swelling (low-level laser therapy). Signals from your body that help you learn to regulate pain (biofeedback). Occupational therapy. This helps you to learn ways to function at home and work with less pain. Recreational therapy. This involves trying new activities or hobbies, such as a physical activity or drawing. Mental health therapy, including: Cognitive behavioral therapy (CBT). This helps you learn coping skills for dealing with pain. Acceptance and commitment therapy (ACT) to change the way you think and react to pain. Relaxation therapies, including muscle relaxation exercises and mindfulness-based stress reduction. Pain management counseling. This may be individual, family, or group counseling.  Receive medical treatments Medical treatments for pain management include: Nerve block injections. These may include a pain blocker and anti-inflammatory medicines. You may have  injections: Near the spine to relieve chronic back or neck pain. Into joints to relieve back or joint pain. Into nerve areas that supply a painful area to relieve body pain. Into muscles (trigger point injections) to relieve some painful muscle conditions. A medical device placed near your spine to help block pain signals and relieve nerve pain or chronic back pain (spinal cord stimulation device). Acupuncture. Follow these instructions at home Medicines Take over-the-counter and prescription medicines only as told by your health care provider. If you are taking pain medicine, ask your health care providers about possible side effects to watch out for. Do not drive or use heavy machinery while taking prescription opioid pain medicine. Lifestyle  Do not use drugs or alcohol to reduce pain. If you drink alcohol, limit how much you have to: 0-1 drink a day for women who are not pregnant. 0-2 drinks a day for men. Know how much alcohol is in a drink. In the [place] one drink equals one 12  oz bottle of beer (355 mL), one 5 oz glass of wine (148 mL), or one 1 oz glass of hard liquor (44 mL). Do not use any products that contain nicotine or tobacco. These products include cigarettes, chewing tobacco, and vaping devices, such as e-cigarettes. If you need help quitting, ask your health care provider. Eat a healthy diet and maintain a healthy weight. Poor diet and excess weight may make pain worse. Eat foods that are high in fiber. These include fresh fruits and vegetables, whole grains, and beans. Limit foods that are high in fat and processed sugars, such as fried and sweet foods. Exercise regularly. Exercise lowers stress and may help relieve pain. Ask your health care provider what activities and exercises are safe for you. If your health care provider approves, join an exercise class that combines movement and stress reduction. Examples include yoga and tai chi. Get enough sleep. Lack of sleep may  make pain worse. Lower stress as much as possible. Practice stress reduction techniques as told by your therapist. General instructions Work with all your pain management providers to find the treatments that work best for you. You are an important member of your pain management team. There are many things you can do to reduce pain on your own. Consider joining an online or in-person support group for people who have chronic pain. Keep all follow-up visits. This is important. Where to find more information You can find more information about managing pain without opioids from: American Academy of Pain Medicine: painmed.org Institute for Chronic Pain: instituteforchronicpain.org American Chronic Pain Association: theacpa.org Contact a health care provider if: You have side effects from pain medicine. Your pain gets worse or does not get better with treatments or home therapy. You are struggling with anxiety or depression. Summary Many types of pain can be managed without opioids. Chronic pain may respond better to pain management without opioids. Pain is best managed when you and a team of health care providers work together. Pain management without opioids may include non-opioid medicines, medical treatments, physical therapy, mental health therapy, and lifestyle changes. Tell your health care providers if your pain gets worse or is not being managed well enough. This information is not intended to replace advice given to you by your health care provider. Make sure you discuss any questions you have with your health care provider. Document Revised: 12/13/2020 Document Reviewed: 12/13/2020 Elsevier Patient Education  2024 [employer].
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
## 2023-10-14 DIAGNOSIS — H903 Sensorineural hearing loss, bilateral: Secondary | ICD-10-CM | POA: Diagnosis not present

## 2023-10-14 DIAGNOSIS — H6123 Impacted cerumen, bilateral: Secondary | ICD-10-CM | POA: Diagnosis not present

## 2023-10-15 ENCOUNTER — Ambulatory Visit: Payer: Medicare PPO | Admitting: Allergy & Immunology

## 2023-10-15 ENCOUNTER — Other Ambulatory Visit: Payer: Self-pay

## 2023-10-15 DIAGNOSIS — J302 Other seasonal allergic rhinitis: Secondary | ICD-10-CM

## 2023-10-15 DIAGNOSIS — J3089 Other allergic rhinitis: Secondary | ICD-10-CM

## 2023-10-15 DIAGNOSIS — J4489 Other specified chronic obstructive pulmonary disease: Secondary | ICD-10-CM | POA: Diagnosis not present

## 2023-10-15 DIAGNOSIS — K219 Gastro-esophageal reflux disease without esophagitis: Secondary | ICD-10-CM

## 2023-10-15 MED ORDER — PREDNISONE 10 MG PO TABS: ORAL_TABLET | ORAL | 0 refills | Status: DC

## 2023-10-15 MED ORDER — IPRATROPIUM BROMIDE 0.06 % NA SOLN: 2.0000 | Freq: Three times a day (TID) | NASAL | 5 refills | Status: DC

## 2023-10-15 NOTE — Patient Instructions (Addendum)
1. Seasonal and perennial allergic rhinitis - Previous testing showed: grasses, indoor molds, outdoor molds, and dust mites (dust mites was the biggest one) - We are going to INCREASE the strength of the Atrovent to see if that helps.  - Continue taking: Atrovent (ipratropium) one spray per nostril twice daily (can be over drying, so decrease the dose if this is happening) - You can change from carbinoxamine to some other antihistamine every 2-3 months to maintain efficacy.  - You can use an extra dose of the antihistamine, if needed, for breakthrough symptoms.  - Consider nasal saline rinses 1-2 times daily to remove allergens from the nasal cavities as well as help with mucous clearance (this is especially helpful to do before the nasal sprays are given) - Consider allergy shots as a means of long-term control. - Allergy shots "re-train" and "reset" the immune system to ignore environmental allergens and decrease the resulting immune response to those allergens (sneezing, itchy watery eyes, runny nose, nasal congestion, etc).    - Allergy shots improve symptoms in 75-85% of patients.  - We can start allergy shots if you are interested (your breathing is better controlled now).  - This will help with the postnasal drip and snot production.   2. Chronic obstructive pulmonary disease - Lung testing was in the 38% range and improved to 74% afterwards. - Continue to use the albuterol neb every 6 hours for the next few days.  - We are otherwise not going to make any changes at all.  - Consider Dupixent as an add on treatment to control your breathing (I am going to talk to Tammy about what to do to get this to happen!).  - Daily controller medication(s): Trelegy 200/62.5/25 one puff once daily - Rescue medications: Xopenex nebulizer one vial every 4-6 hours as needed - Asthma control goals:  * Full participation in all desired activities (may need albuterol before activity) * Albuterol use two time  or less a week on average (not counting use with activity) * Cough interfering with sleep two time or less a month * Oral steroids no more than once a year * No hospitalizations  3. Return in about 3 months (around 01/13/2024). You can have the follow up appointment with Dr. Dellis Anes or a Nurse Practicioner (our Nurse Practitioners are excellent and always have Physician oversight!).    Please inform us of any Emergency Department visits, hospitalizations, or changes in symptoms. Call us before going to the ED for breathing or allergy symptoms since we might be able to fit you in for a sick visit. Feel free to contact us anytime with any questions, problems, or concerns.  It was a pleasure to see you and your family again today!  Websites that have reliable patient information: 1. American Academy of Asthma, Allergy, and Immunology: www.aaaai.org 2. Food Allergy Research and Education (FARE): foodallergy.org 3. Mothers of Asthmatics: http://www.asthmacommunitynetwork.org 4. American College of Allergy, Asthma, and Immunology: www.acaai.org      "Like" [place] on Facebook and Instagram for our latest updates!      A healthy democracy works best when [employer] participate! Make sure you are registered to vote! If you have moved or changed any of your contact information, you will need to get this updated before voting! Scan the QR codes below to learn more!

## 2023-10-15 NOTE — Progress Notes (Signed)
FOLLOW UP  Date of Service/Encounter:  10/17/23   Assessment:   Seasonal and perennial allergic rhinitis (grasses, indoor molds, outdoor molds, and dust mites)   Chronic obstructive pulmonary disease - considering addition of Dupixent due to high risk with multiple prednisone tapers   AEC 300 in March 2024  Plan/Recommendations:   1. Seasonal and perennial allergic rhinitis - Previous testing showed: grasses, indoor molds, outdoor molds, and dust mites (dust mites was the biggest one) - We are going to INCREASE the strength of the Atrovent to see if that helps.  - Continue taking: Atrovent (ipratropium) one spray per nostril twice daily (can be over drying, so decrease the dose if this is happening) - You can change from carbinoxamine to some other antihistamine every 2-3 months to maintain efficacy.  - You can use an extra dose of the antihistamine, if needed, for breakthrough symptoms.  - Consider nasal saline rinses 1-2 times daily to remove allergens from the nasal cavities as well as help with mucous clearance (this is especially helpful to do before the nasal sprays are given) - Consider allergy shots as a means of long-term control. - Allergy shots "re-train" and "reset" the immune system to ignore environmental allergens and decrease the resulting immune response to those allergens (sneezing, itchy watery eyes, runny nose, nasal congestion, etc).    - Allergy shots improve symptoms in 75-85% of patients.  - We can start allergy shots if you are interested (your breathing is better controlled now).  - This will help with the postnasal drip and snot production.   2. Chronic obstructive pulmonary disease - Lung testing was in the 38% range and improved to 74% afterwards. - Continue to use the albuterol neb every 6 hours for the next few days.  - We are otherwise not going to make any changes at all.  - Consider Dupixent as an add on treatment to control your breathing (I am  going to talk to Tammy about what to do to get this to happen!).  - Daily controller medication(s): Trelegy 200/62.5/25 one puff once daily - Rescue medications: Xopenex nebulizer one vial every 4-6 hours as needed - Asthma control goals:  * Full participation in all desired activities (may need albuterol before activity) * Albuterol use two time or less a week on average (not counting use with activity) * Cough interfering with sleep two time or less a month * Oral steroids no more than once a year * No hospitalizations  3. Return in about 3 months (around 01/13/2024). You can have the follow up appointment with Dr. Dellis Anes or a Nurse Practicioner (our Nurse Practitioners are excellent and always have Physician oversight!).    Subjective:   Benjamin [name] is a 83 y.o. male presenting today for follow up of  Chief Complaint  Patient presents with   Asthma    States that his breathing is worst.    Wheezing   Breathing Problem   Other    C/o nasal drip    Benjamin [name] has a history of the following: Patient Active Problem List   Diagnosis Date Noted   Seasonal and perennial allergic rhinitis 07/04/2023   Leg cramps 05/28/2023   Chronic sinusitis 01/31/2023   Bronchiectasis 01/31/2023   Chronic respiratory failure with hypoxia (HCC) 12/31/2022   Encounter for general adult medical examination with abnormal findings 11/22/2022   Bilateral impacted cerumen 05/22/2022   Hospital discharge follow-up 10/30/2021   History of adenomatous polyp of colon 10/30/2021  History of duodenal ulcer 10/30/2021   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   Grade II hemorrhoids 10/23/2021   Rectal bleeding 10/23/2021   Hyperlipidemia LDL goal <70 10/18/2021   Muscle weakness (generalized) 10/16/2021   Right inguinal hernia    Hernia, umbilical 04/13/2021   Thrombocytopenia (HCC) 01/23/2020   Chronic allergic rhinitis 12/21/2019   Chronic pain syndrome 12/21/2019   Chronic insomnia 12/20/2019   AK  (actinic keratosis) 03/17/2019   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   Discussed the use of AI scribe software for clinical note transcription with the patient and/or guardian, who gave verbal consent to proceed.  Benjamin [name] is a 83 y.o. male presenting for a follow up visit.  He was last seen in October 2024.  At that time, he was continued on Trelegy 20 mcg 1 puff once daily as well as Xopenex as needed.  He was also strongly encouraged to start Dupixent.  For his allergic rhinitis, he continue with an antihistamine alternating every 3 months.  He also continue with Flonase as well as ipratropium.  Since last visit, he has been off and on.  He continues to report nasal congestion and breathing difficulties.  Asthma/Respiratory Symptom History: Breathing difficulties are generally better with the use of Trelegy. He has been using this regularly without a problem. An episode last Saturday involved almost passing out due to a lack of oxygen and difficulty reaching a hotel room. Albuterol has been used a few times recently, providing quick relief. The nebulizer has not been used since starting Trelegy, which is found effective. Of concerns is expressed about the cost of Trelegy, which is $25 per month. He and his wife are worried about the possibility of cholesterol medication affecting oxygen absorption, but it  has been used for a couple of years without new breathing episodes.  He has not experienced anything, rather his wife heard about potential side effects were heard about from an infomercial, but no severe reactions have been experienced. He does have a full pulmonary function test scheduled.  Evidently, this was ordered by his cardiologist as they do not want to deal with his pulmonologist anymore.   Review of the phone notes show that in August, he was denied the Dupixent because he was not using his Trelegy routinely.  It was required that he show compliance for 6 months before was approved.  Allergic Rhinitis Symptom History: Nasal congestion has persisted for a couple of hours. Nasal spray, which previously provided relief for several hours, now only lasts about two hours. Various antihistamines including Zyrtec, carbinoxamine, Claritin, and Clarinex have been tried without significant relief.  He has not been on allergy shots.  We initially did not start them because his breathing status was so tenuous.  However, now it is under better control this is an option.    Otherwise, there have been no changes to his past medical history, surgical history, family history, or social history.  Review of systems otherwise negative other than that mentioned in the HPI.    Objective:   There were no vitals taken for this visit. There is no height or weight on file to calculate BMI.    Physical Exam Vitals reviewed.  Constitutional:      Appearance: He is well-developed.     Comments: Talkative.  Hard of hearing. Pleasant, but gruff.   HENT:     Head: Normocephalic and atraumatic.     Right Ear: Tympanic membrane, ear canal and external ear normal. No drainage, swelling or tenderness. Tympanic membrane is not injected, scarred, erythematous, retracted or bulging.     Left Ear: Tympanic membrane, ear canal and external ear normal. No drainage, swelling or tenderness. Tympanic membrane is not  injected, scarred, erythematous, retracted or bulging.     Nose: No nasal deformity, septal deviation, mucosal edema or rhinorrhea.     Right Turbinates: Enlarged, swollen and pale.     Left Turbinates: Enlarged, swollen and pale.     Right Sinus: No maxillary sinus tenderness or frontal sinus tenderness.     Left Sinus: No maxillary sinus tenderness or frontal sinus tenderness.     Comments: No nasal polyps. Rhinorrhea still present.     Mouth/Throat:     Mouth: Mucous membranes are not pale and not dry.     Pharynx: Uvula midline.  Eyes:     General:        Right eye: No discharge.        Left eye: No discharge.     Conjunctiva/sclera: Conjunctivae normal.     Right eye: Right conjunctiva is not injected. No chemosis.    Left eye: Left conjunctiva is not injected. No chemosis.    Pupils: Pupils are equal, round, and reactive to light.  Cardiovascular:     Rate and Rhythm: Normal rate and regular rhythm.     Heart sounds: Normal heart sounds.  Pulmonary:     Effort: Pulmonary effort is normal. No tachypnea, accessory muscle usage or respiratory distress.     Breath sounds: Normal breath sounds. No wheezing, rhonchi or rales.     Comments: Decreased air movement at the bases.  He did improve following the nebulizer treatment. Chest:     Chest wall: No tenderness.  Abdominal:     Tenderness: There is no abdominal tenderness. There is no guarding or rebound.  Lymphadenopathy:     Head:     Right side of head: No submandibular, tonsillar or occipital adenopathy.     Left side of head: No submandibular, tonsillar or occipital adenopathy.     Cervical: No cervical adenopathy.  Skin:    Coloration: Skin is not pale.     Findings: No abrasion, erythema, petechiae or rash. Rash is not papular, urticarial or vesicular.  Neurological:     Mental Status: He is alert.  Psychiatric:        Behavior: Behavior is cooperative.      Diagnostic studies:    Spirometry: results abnormal  (FEV1: 1.01/38%, FVC: 1.28/36%, FEV1/FVC: 79%).    Spirometry consistent with possible restrictive disease. Albuterol/Atrovent nebulizer treatment given in clinic with significant improvement in FEV1 and FVC per ATS criteria. The FEV1 increased 750 mL and the FEV1 increased 1490 mL.   Allergy Studies: none       Malachi Bonds, MD  [facility]

## 2023-10-17 ENCOUNTER — Encounter: Payer: Self-pay | Admitting: Allergy & Immunology

## 2023-10-24 ENCOUNTER — Other Ambulatory Visit: Payer: Self-pay | Admitting: *Deleted

## 2023-10-24 ENCOUNTER — Telehealth: Payer: Self-pay | Admitting: Allergy & Immunology

## 2023-10-24 MED ORDER — TRELEGY ELLIPTA 200-62.5-25 MCG/ACT IN AEPB: 1.0000 | INHALATION_SPRAY | Freq: Every morning | RESPIRATORY_TRACT | 1 refills | Status: DC

## 2023-10-24 NOTE — Telephone Encounter (Signed)
 Refills have been sent in to requested pharmacy. Called and left a detailed voicemail advising per [facility] permission.

## 2023-10-24 NOTE — Telephone Encounter (Signed)
 Patient called stating he needs a 90 day supply of Trelegy sent to [facility] pharmacy.

## 2023-10-27 ENCOUNTER — Telehealth: Payer: Self-pay | Admitting: *Deleted

## 2023-10-27 NOTE — Telephone Encounter (Signed)
 L/m with patient wife to contact me to discuss Dupixent  for COPD

## 2023-10-27 NOTE — Telephone Encounter (Signed)
-----   Message from [place] Chuck sent at 10/17/2023  5:34 AM EST ----- DUPIXENT ! He has been compliant with the Trelegy.

## 2023-10-28 NOTE — Telephone Encounter (Signed)
Spoke to patient and advised submit to [facility] with dosing, delivery and storage instructions

## 2023-10-29 NOTE — Telephone Encounter (Signed)
Great!

## 2023-11-03 DIAGNOSIS — J479 Bronchiectasis, uncomplicated: Secondary | ICD-10-CM | POA: Diagnosis not present

## 2023-11-03 DIAGNOSIS — J449 Chronic obstructive pulmonary disease, unspecified: Secondary | ICD-10-CM | POA: Diagnosis not present

## 2023-11-03 DIAGNOSIS — E119 Type 2 diabetes mellitus without complications: Secondary | ICD-10-CM | POA: Diagnosis not present

## 2023-11-03 LAB — HM DIABETES EYE EXAM

## 2023-11-04 ENCOUNTER — Ambulatory Visit (HOSPITAL_COMMUNITY)
Admission: RE | Admit: 2023-11-04 | Discharge: 2023-11-04 | Disposition: A | Discharge status: Home / Self Care | Payer: Medicare PPO | Source: Ambulatory Visit | Attending: Nurse Practitioner | Admitting: Nurse Practitioner

## 2023-11-04 DIAGNOSIS — R0609 Other forms of dyspnea: Secondary | ICD-10-CM | POA: Insufficient documentation

## 2023-11-04 LAB — PULMONARY FUNCTION TEST
DL/VA % pred: 50 %
DL/VA: 1.94 ml/min/mmHg/L
DLCO unc % pred: 32 %
DLCO unc: 7.8 ml/min/mmHg
FEF 25-75 Post: 0.47 L/s
FEF 25-75 Pre: 0.6 L/s
FEF2575-%Change-Post: -22 %
FEF2575-%Pred-Post: 24 %
FEF2575-%Pred-Pre: 31 %
FEV1-%Change-Post: -22 %
FEV1-%Pred-Post: 34 %
FEV1-%Pred-Pre: 45 %
FEV1-Post: 0.99 L
FEV1-Pre: 1.28 L
FEV1FVC-%Change-Post: -22 %
FEV1FVC-%Pred-Pre: 56 %
FEV6-%Change-Post: -10 %
FEV6-%Pred-Post: 73 %
FEV6-%Pred-Pre: 82 %
FEV6-Post: 2.74 L
FEV6-Pre: 3.06 L
FEV6FVC-%Change-Post: -10 %
FEV6FVC-%Pred-Post: 93 %
FEV6FVC-%Pred-Pre: 103 %
FVC-%Change-Post: 0 %
FVC-%Pred-Post: 79 %
FVC-%Pred-Pre: 79 %
FVC-Post: 3.16 L
FVC-Pre: 3.17 L
Post FEV1/FVC ratio: 31 %
Post FEV6/FVC ratio: 87 %
Pre FEV1/FVC ratio: 40 %
Pre FEV6/FVC Ratio: 96 %
RV % pred: 52 %
RV: 1.4 L
TLC % pred: 67 %
TLC: 4.76 L

## 2023-11-04 MED ORDER — ALBUTEROL SULFATE (2.5 MG/3ML) 0.083% IN NEBU
2.5000 mg | INHALATION_SOLUTION | Freq: Once | RESPIRATORY_TRACT | Status: AC
  Administered 2023-11-04: 2.5 mg via RESPIRATORY_TRACT

## 2023-11-10 ENCOUNTER — Encounter: Payer: Self-pay | Admitting: Internal Medicine

## 2023-11-17 ENCOUNTER — Ambulatory Visit: Payer: Medicare PPO

## 2023-11-17 DIAGNOSIS — J449 Chronic obstructive pulmonary disease, unspecified: Secondary | ICD-10-CM | POA: Diagnosis not present

## 2023-11-17 MED ORDER — DUPILUMAB 300 MG/2ML ~~LOC~~ SOSY
300.0000 mg | PREFILLED_SYRINGE | SUBCUTANEOUS | Status: AC
  Administered 2023-11-17: 300 mg via SUBCUTANEOUS

## 2023-11-17 MED ORDER — IPRATROPIUM BROMIDE 0.06 % NA SOLN: 2.0000 | Freq: Three times a day (TID) | NASAL | 1 refills | Status: DC

## 2023-11-17 NOTE — Addendum Note (Signed)
 Addended by: Orson Aloe on: 11/17/2023 04:24 PM   Modules accepted: Orders

## 2023-11-17 NOTE — Progress Notes (Signed)
 Immunotherapy   Patient Details  Name: Benjamin [name] MRN: [id number] Date of Birth: 01/18/1941  11/17/2023  Benjamin [name] started injections for  Dupixent. Following schedule: Every fourteen days.  Frequency:Every two weeks. Epi-Pen:Not needed. Consent signed in office and patient instructions given on how to administer injection to himself in the arm and thigh. Patient expressed that he currently self administers injections to himself already. Patient was taught on how to administer Dupixent into the thigh and the arm. Patient successfully administered his Dupixent into his left thigh and sat in the lobby for fifteen minutes with his wife without an issue.   Ralene Muskrat 11/17/2023, 2:26 PM

## 2023-11-25 ENCOUNTER — Encounter: Payer: Self-pay | Admitting: Internal Medicine

## 2023-11-25 ENCOUNTER — Ambulatory Visit (INDEPENDENT_AMBULATORY_CARE_PROVIDER_SITE_OTHER): Payer: Medicare PPO | Admitting: Internal Medicine

## 2023-11-25 VITALS — BP 116/68 | HR 86 | Ht 70.0 in | Wt 194.8 lb

## 2023-11-25 DIAGNOSIS — J9611 Chronic respiratory failure with hypoxia: Secondary | ICD-10-CM

## 2023-11-25 DIAGNOSIS — J4489 Other specified chronic obstructive pulmonary disease: Secondary | ICD-10-CM

## 2023-11-25 DIAGNOSIS — R7303 Prediabetes: Secondary | ICD-10-CM

## 2023-11-25 DIAGNOSIS — I5032 Chronic diastolic (congestive) heart failure: Secondary | ICD-10-CM

## 2023-11-25 DIAGNOSIS — I2510 Atherosclerotic heart disease of native coronary artery without angina pectoris: Secondary | ICD-10-CM | POA: Diagnosis not present

## 2023-11-25 DIAGNOSIS — Z0001 Encounter for general adult medical examination with abnormal findings: Secondary | ICD-10-CM

## 2023-11-25 DIAGNOSIS — E785 Hyperlipidemia, unspecified: Secondary | ICD-10-CM | POA: Diagnosis not present

## 2023-11-25 DIAGNOSIS — E559 Vitamin D deficiency, unspecified: Secondary | ICD-10-CM

## 2023-11-25 DIAGNOSIS — N401 Enlarged prostate with lower urinary tract symptoms: Secondary | ICD-10-CM

## 2023-11-25 DIAGNOSIS — R351 Nocturia: Secondary | ICD-10-CM

## 2023-11-25 NOTE — Assessment & Plan Note (Signed)
 From previous visit: O2 sat at rest: 90% O2 sat upon ambulation: 86% O2 sat with O2 upon ambulation: 92%   Likely due to COPD Has home O2 for ambulation

## 2023-11-25 NOTE — Assessment & Plan Note (Addendum)
 Overall well-controlled with Trelegy now Xopenex inhaler and nebulizer as needed for dyspnea or wheezing Has oxygen desaturation upon ambulation, has home O2 for as needed use/upon ambulation Followed by pulmonology and Allergy specialist
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
## 2023-11-25 NOTE — Patient Instructions (Signed)
Please continue to take medications as prescribed.  Please continue to follow low carb diet and ambulate as tolerated. 

## 2023-11-25 NOTE — Assessment & Plan Note (Signed)
 Uncontrolled with Flomax 0.8 mg QD and finasteride 5 mg once daily, still has nocturia S/p TURP Followed by Urology

## 2023-11-26 LAB — LIPID PANEL

## 2023-11-26 NOTE — Assessment & Plan Note (Addendum)
 Has a history of HFpEF Has mild leg swelling He had recent cardiology evaluation Has Lasix as needed for leg swelling or dyspnea

## 2023-11-26 NOTE — Assessment & Plan Note (Signed)
Physical exam as documented. Fasting blood tests today. Advised to get Shingrix vaccines at local pharmacy.

## 2023-11-26 NOTE — Assessment & Plan Note (Signed)
S/p CABG Followed by cardiology On metoprolol Denies chest pain or dyspnea currently On Repatha -has statin intolerance 

## 2023-11-26 NOTE — Assessment & Plan Note (Signed)
 Lab Results  Component Value Date   HGBA1C 6.1 (H) 11/25/2023   Advised to follow low carb diet for now

## 2023-11-26 NOTE — Assessment & Plan Note (Signed)
On Repatha -has statin intolerance

## 2023-11-27 LAB — CBC WITH DIFFERENTIAL/PLATELET
Basophils Absolute: 0.1 10*3/uL (ref 0.0–0.2)
Basos: 1 %
EOS (ABSOLUTE): 0.2 10*3/uL (ref 0.0–0.4)
Eos: 3 %
Hematocrit: 50.3 % (ref 37.5–51.0)
Hemoglobin: 17.1 g/dL (ref 13.0–17.7)
Immature Grans (Abs): 0.1 10*3/uL (ref 0.0–0.1)
Immature Granulocytes: 1 %
Lymphocytes Absolute: 1.7 10*3/uL (ref 0.7–3.1)
Lymphs: 21 %
MCH: 32.3 pg (ref 26.6–33.0)
MCHC: 34 g/dL (ref 31.5–35.7)
MCV: 95 fL (ref 79–97)
Monocytes Absolute: 0.6 10*3/uL (ref 0.1–0.9)
Monocytes: 7 %
Neutrophils Absolute: 5.5 10*3/uL (ref 1.4–7.0)
Neutrophils: 67 %
Platelets: 287 10*3/uL (ref 150–450)
RBC: 5.29 x10E6/uL (ref 4.14–5.80)
RDW: 13 % (ref 11.6–15.4)
WBC: 8.2 10*3/uL (ref 3.4–10.8)

## 2023-11-27 LAB — LIPID PANEL
Cholesterol, Total: 91 mg/dL — ABNORMAL LOW (ref 100–199)
HDL: 39 mg/dL — ABNORMAL LOW (ref >39)
LDL CALC COMMENT:: 2.3 ratio (ref 0.0–5.0)
LDL Chol Calc (NIH): 26 mg/dL (ref 0–99)
Triglycerides: 159 mg/dL — ABNORMAL HIGH (ref 0–149)
VLDL Cholesterol Cal: 26 mg/dL (ref 5–40)

## 2023-11-27 LAB — CMP14+EGFR
ALT: 23 IU/L (ref 0–44)
AST: 20 IU/L (ref 0–40)
Albumin: 4.3 g/dL (ref 3.7–4.7)
Alkaline Phosphatase: 57 IU/L (ref 44–121)
BUN/Creatinine Ratio: 19 (ref 10–24)
BUN: 20 mg/dL (ref 8–27)
Bilirubin Total: 1.4 mg/dL — ABNORMAL HIGH (ref 0.0–1.2)
CO2: 19 mmol/L — ABNORMAL LOW (ref 20–29)
Calcium: 9.4 mg/dL (ref 8.6–10.2)
Chloride: 105 mmol/L (ref 96–106)
Creatinine, Ser: 1.08 mg/dL (ref 0.76–1.27)
Globulin, Total: 2.5 g/dL (ref 1.5–4.5)
Glucose: 92 mg/dL (ref 70–99)
Potassium: 4.1 mmol/L (ref 3.5–5.2)
Sodium: 142 mmol/L (ref 134–144)
Total Protein: 6.8 g/dL (ref 6.0–8.5)
eGFR: 69 mL/min/{1.73_m2} (ref >59)

## 2023-11-27 LAB — HEMOGLOBIN A1C
Est. average glucose Bld gHb Est-mCnc: 128 mg/dL
Hgb A1c MFr Bld: 6.1 % — ABNORMAL HIGH (ref 4.8–5.6)

## 2023-11-27 LAB — VITAMIN D 25 HYDROXY (VIT D DEFICIENCY, FRACTURES): Vit D, 25-Hydroxy: 39.6 ng/mL (ref 30.0–100.0)

## 2023-11-27 LAB — TSH: TSH: 3.25 u[IU]/mL (ref 0.450–4.500)

## 2023-12-01 DIAGNOSIS — J479 Bronchiectasis, uncomplicated: Secondary | ICD-10-CM | POA: Diagnosis not present

## 2023-12-01 DIAGNOSIS — J449 Chronic obstructive pulmonary disease, unspecified: Secondary | ICD-10-CM | POA: Diagnosis not present

## 2023-12-04 ENCOUNTER — Other Ambulatory Visit: Payer: Self-pay | Admitting: Cardiology

## 2023-12-12 DIAGNOSIS — M25511 Pain in right shoulder: Secondary | ICD-10-CM | POA: Diagnosis not present

## 2023-12-12 DIAGNOSIS — M542 Cervicalgia: Secondary | ICD-10-CM | POA: Diagnosis not present

## 2023-12-19 DIAGNOSIS — M542 Cervicalgia: Secondary | ICD-10-CM | POA: Diagnosis not present

## 2023-12-20 DIAGNOSIS — M542 Cervicalgia: Secondary | ICD-10-CM | POA: Diagnosis not present

## 2024-01-01 DIAGNOSIS — J479 Bronchiectasis, uncomplicated: Secondary | ICD-10-CM | POA: Diagnosis not present

## 2024-01-01 DIAGNOSIS — J449 Chronic obstructive pulmonary disease, unspecified: Secondary | ICD-10-CM | POA: Diagnosis not present

## 2024-01-03 ENCOUNTER — Other Ambulatory Visit: Payer: Self-pay | Admitting: Internal Medicine

## 2024-01-12 DIAGNOSIS — M542 Cervicalgia: Secondary | ICD-10-CM | POA: Diagnosis not present

## 2024-01-13 NOTE — Patient Instructions (Incomplete)
 Asthma/COPD Continue Trelegy 200-1 puff once a day to prevent cough or wheeze Continue Xopenex  via nebulizer or Xopenex  HFA once every 6 hours as needed for cough or wheeze You may use Xopenex  5-15 minutes before activity to decrease cough or wheeze Stop Dupixent  at this time as your breathing is not improving with this medication Begin ensifentrine  once a day via nebulizer to help control your breathing Consider follow up with a pulmonary specialist  Allergic rhinitis Continue allergen avoidance measures directed toward grass pollen, mold, and dust mites as listed below Continue an antihistamine once a day as needed for a runny nose or itch. Remember to rotate to a different antihistamine about every 3 months. Some examples of over the counter antihistamines include Zyrtec (cetirizine), Xyzal (levocetirizine), Allegra (fexofenadine), and Claritin  (loratidine).  Continue Flonase 2 sprays in each nostril once a day as needed for a stuffy nose.  In the right nostril, point the applicator out toward the right ear. In the left nostril, point the applicator out toward the left ear Continue ipratropium 1-2 sprays in each nostril once or twice a day as needed for a runny nose Consider saline nasal rinses as needed for nasal symptoms. Use this before any medicated nasal sprays for best result  Reflux Continue dietary and lifestyle modifications as listed below  Call the clinic if this treatment plan is not working well for you  Follow up in 2 months or sooner if needed.   Lifestyle Changes for Controlling GERD When you have GERD, stomach acid feels as if it's backing up toward your mouth. Whether or not you take medication to control your GERD, your symptoms can often be improved with lifestyle changes.   Raise Your Head Reflux is more likely to strike when you're lying down flat, because stomach fluid can flow backward more easily. Raising the head of your bed 4-6 inches can help. To do  this: Slide blocks or books under the legs at the head of your bed. Or, place a wedge under the mattress. Many foam stores can make a suitable wedge for you. The wedge should run from your waist to the top of your head. Don't just prop your head on several pillows. This increases pressure on your stomach. It can make GERD worse.  Watch Your Eating Habits Certain foods may increase the acid in your stomach or relax the lower esophageal sphincter, making GERD more likely. It's best to avoid the following: Coffee, tea, and carbonated drinks (with and without caffeine) Fatty, fried, or spicy food Mint, chocolate, onions, and tomatoes Any other foods that seem to irritate your stomach or cause you pain  Relieve the Pressure Eat smaller meals, even if you have to eat more often. Don't lie down right after you eat. Wait a few hours for your stomach to empty. Avoid tight belts and tight-fitting clothes. Lose excess weight.  Tobacco and Alcohol Avoid smoking tobacco and drinking alcohol. They can make GERD symptoms worse.  Reducing Pollen Exposure The American Academy of Allergy , Asthma and Immunology suggests the following steps to reduce your exposure to pollen during allergy  seasons. Do not hang sheets or clothing out to dry; pollen may collect on these items. Do not mow lawns or spend time around freshly cut grass; mowing stirs up pollen. Keep windows closed at night.  Keep car windows closed while driving. Minimize morning activities outdoors, a time when pollen counts are usually at their highest. Stay indoors as much as possible when pollen counts or humidity  is high and on windy days when pollen tends to remain in the air longer. Use air conditioning when possible.  Many air conditioners have filters that trap the pollen spores. Use a HEPA room air filter to remove pollen form the indoor air you breathe. Control of Mold Allergen Mold and fungi can grow on a variety of surfaces  provided certain temperature and moisture conditions exist.  Outdoor molds grow on plants, decaying vegetation and soil.  The major outdoor mold, Alternaria and Cladosporium, are found in very high numbers during hot and dry conditions.  Generally, a late Summer - Fall peak is seen for common outdoor fungal spores.  Rain will temporarily lower outdoor mold spore count, but counts rise rapidly when the rainy period ends.  The most important indoor molds are Aspergillus and Penicillium.  Dark, humid and poorly ventilated basements are ideal sites for mold growth.  The next most common sites of mold growth are the bathroom and the kitchen.  Outdoor [employer] Use air conditioning and keep windows closed Avoid exposure to decaying vegetation. Avoid leaf raking. Avoid grain handling. Consider wearing a face mask if working in moldy areas.  Indoor Mold Control Maintain humidity below 50%. Clean washable surfaces with 5% bleach solution. Remove sources e.g. Contaminated carpets.    Control of Dust Mite Allergen Dust mites play a major role in allergic asthma and rhinitis. They occur in environments with high humidity wherever human skin is found. Dust mites absorb humidity from the atmosphere (ie, they do not drink) and feed on organic matter (including shed human and animal skin). Dust mites are a microscopic type of insect that you cannot see with the naked eye. High levels of dust mites have been detected from mattresses, pillows, carpets, upholstered furniture, bed covers, clothes, soft toys and any woven material. The principal allergen of the dust mite is found in its feces. A gram of dust may contain 1,000 mites and 250,000 fecal particles. Mite antigen is easily measured in the air during house cleaning activities. Dust mites do not bite and do not cause harm to humans, other than by triggering allergies/asthma.  Ways to decrease your exposure to dust mites in your home:  1. Encase  mattresses, box springs and pillows with a mite-impermeable barrier or cover  2. Wash sheets, blankets and drapes weekly in hot water  (130 F) with detergent and dry them in a dryer on the hot setting.  3. Have the room cleaned frequently with a vacuum cleaner and a damp dust-mop. For carpeting or rugs, vacuuming with a vacuum cleaner equipped with a high-efficiency particulate air (HEPA) filter. The dust mite allergic individual should not be in a room which is being cleaned and should wait 1 hour after cleaning before going into the room.  4. Do not sleep on upholstered furniture (eg, couches).  5. If possible removing carpeting, upholstered furniture and drapery from the home is ideal. Horizontal blinds should be eliminated in the rooms where the person spends the most time (bedroom, study, television room). Washable vinyl, roller-type shades are optimal.  6. Remove all non-washable stuffed toys from the bedroom. Wash stuffed toys weekly like sheets and blankets above.  7. Reduce indoor humidity to less than 50%. Inexpensive humidity monitors can be purchased at most hardware stores. Do not use a humidifier as can make the problem worse and are not recommended.

## 2024-01-13 NOTE — Progress Notes (Signed)
 [address] [place] [address] Dept: [phone]  FOLLOW UP NOTE  Patient ID: Benjamin [name], male    DOB: 04/24/1941  Age: 83 y.o. MRN: [id number] Date of Office Visit: 01/14/2024  Assessment  Chief Complaint: Follow-up (Has noticed no difference. )  HPI Benjamin [name] is an 83 year old male who presents to the clinic for a follow up visit. He was last seen in this clinic on 10/15/2023 by Dr. Idolina [name] for evaluation of COPD and allergic rhinitis. He is accompanied by his wife who assists with history.  At today's visit, he reports that his asthma/COPD has not been well controlled with shortness of breath with activity as the main symptom. He continues Trelegy Ellipta  200-1 puff once a day and uses levalbuterol  if needed for asthma symptoms. He reports his symptoms have improved since beginning Trelegy, however, he continues to experience shortness of breath with activity.  He occasionally uses Xopenex  before activity, however, he reports that it is difficult to gauge when he is going to be active.  He has received a total of 4 Dupixent  injections and reports no relief of symptoms after receiving Dupixent .  He is not interested in receiving any more injections of Dupixent .  He reports that about once a month he uses oxygen  via nasal cannula.  He reports that he has had the oxygen  tank for well over a year.   Allergic rhinitis is reported as moderately well-controlled with occasional clear rhinorrhea as the main symptom.  He continues Atrovent  as needed with relief of symptoms.  He is not currently taking an antihistamine or using a nasal saline rinse. His last environmental allergy  testing was on 03/05/2023 and was positive to grass pollen, mold, and dust mites.   Reflux is reported as well-controlled with no medical intervention at this time.  His current medications are listed in the chart.  Drug Allergies:  Allergies  Allergen Reactions   Arsenic Swelling    Severe swelling  if patient comes in contact    Contrast Media [Iodinated Contrast Media] Swelling and Rash   Nitrofurantoin  Monohyd Macro Itching    Completed the full course while taking Benadryl  for    Statins Rash    Joint pain    Physical Exam: BP 108/82   Pulse 91   Temp 98.2 F (36.8 C)   Resp 14   Wt 192 lb 1 oz (87.1 kg) Comment: per pt.  SpO2 92%   BMI 27.56 kg/m    Physical Exam Vitals reviewed.  Constitutional:      Appearance: Normal appearance.  HENT:     Head: Normocephalic and atraumatic.     Right Ear: Tympanic membrane normal.     Left Ear: Tympanic membrane normal.     Nose:     Comments: Bilateral nares slightly erythematous with thin clear nasal drainage noted.  Pharynx slightly erythematous with no exudate.  Ears normal.  Eyes normal.    Mouth/Throat:     Pharynx: Oropharynx is clear.  Eyes:     Conjunctiva/sclera: Conjunctivae normal.  Cardiovascular:     Rate and Rhythm: Normal rate and regular rhythm.     Heart sounds: Normal heart sounds. No murmur heard. Pulmonary:     Effort: Pulmonary effort is normal.     Breath sounds: Normal breath sounds.     Comments: Lungs clear to auscultation Musculoskeletal:        General: Normal range of motion.     Cervical back: Normal range of  motion and neck supple.  Skin:    General: Skin is warm and dry.  Neurological:     Mental Status: He is alert and oriented to person, place, and time.  Psychiatric:        Mood and Affect: Mood normal.        Behavior: Behavior normal.        Thought Content: Thought content normal.        Judgment: Judgment normal.     Diagnostics: FVC 2.04 which is 57% of predicted value. FEV1 1.83 which is 69% of predicted value. Spirometry indicates normal ventilatory function with reduced FVC  Assessment and Plan: 1. Asthma-COPD overlap syndrome (HCC)   2. Seasonal and perennial allergic rhinitis   3. Gastroesophageal reflux disease, unspecified whether esophagitis present     No  orders of the defined types were placed in this encounter.   Patient Instructions  Asthma/COPD Continue Trelegy 200-1 puff once a day to prevent cough or wheeze Continue Xopenex  via nebulizer or Xopenex  HFA once every 6 hours as needed for cough or wheeze You may use Xopenex  5-15 minutes before activity to decrease cough or wheeze Begin ensifentrine  once a day via nebulizer to help control your breathing Consider follow up with a pulmonary specialist  Allergic rhinitis Continue allergen avoidance measures directed toward grass pollen, mold, and dust mites as listed below Continue an antihistamine once a day as needed for a runny nose or itch. Remember to rotate to a different antihistamine about every 3 months. Some examples of over the counter antihistamines include Zyrtec (cetirizine), Xyzal (levocetirizine), Allegra (fexofenadine), and Claritin  (loratidine).  Continue Flonase 2 sprays in each nostril once a day as needed for a stuffy nose.  In the right nostril, point the applicator out toward the right ear. In the left nostril, point the applicator out toward the left ear Continue ipratropium 1-2 sprays in each nostril once or twice a day as needed for a runny nose Consider saline nasal rinses as needed for nasal symptoms. Use this before any medicated nasal sprays for best result  Reflux Continue dietary and lifestyle modifications as listed below  Call the clinic if this treatment plan is not working well for you  Follow up in 2 months or sooner if needed.   Return in about 2 months (around 03/15/2024), or if symptoms worsen or fail to improve.    Thank you for the opportunity to care for this patient.  Please do not hesitate to contact me with questions.  Benjamin Sic, FNP [facility] 

## 2024-01-14 ENCOUNTER — Ambulatory Visit (INDEPENDENT_AMBULATORY_CARE_PROVIDER_SITE_OTHER): Payer: Medicare PPO | Admitting: Family Medicine

## 2024-01-14 ENCOUNTER — Encounter: Payer: Self-pay | Admitting: Family Medicine

## 2024-01-14 VITALS — BP 108/82 | HR 91 | Temp 98.2°F | Resp 14 | Wt 192.1 lb

## 2024-01-14 DIAGNOSIS — J302 Other seasonal allergic rhinitis: Secondary | ICD-10-CM

## 2024-01-14 DIAGNOSIS — J3089 Other allergic rhinitis: Secondary | ICD-10-CM | POA: Diagnosis not present

## 2024-01-14 DIAGNOSIS — J4489 Other specified chronic obstructive pulmonary disease: Secondary | ICD-10-CM

## 2024-01-14 DIAGNOSIS — K219 Gastro-esophageal reflux disease without esophagitis: Secondary | ICD-10-CM

## 2024-01-16 MED ORDER — ENSIFENTRINE 3 MG/2.5ML IN SUSP: 3.0000 mg | Freq: Two times a day (BID) | RESPIRATORY_TRACT | 5 refills | Status: DC

## 2024-01-16 NOTE — Addendum Note (Signed)
 Addended by: Mahdi Frye M on: 01/16/2024 08:35 AM   Modules accepted: Orders

## 2024-01-21 ENCOUNTER — Telehealth: Payer: Self-pay

## 2024-01-21 NOTE — Telephone Encounter (Signed)
 I called and spoke to the patient's wife and discontinuation form has been placed to be mailed out. The wife said she will bring it back to the office.

## 2024-01-21 NOTE — Telephone Encounter (Signed)
-----   Message from Marinus Sic sent at 01/14/2024  9:45 PM EDT ----- Patient wants to stop Dupixent . Can you please send our paperwork to stop injections or have him come out to sign? Thank you

## 2024-01-26 DIAGNOSIS — M5412 Radiculopathy, cervical region: Secondary | ICD-10-CM | POA: Diagnosis not present

## 2024-01-29 ENCOUNTER — Telehealth: Payer: Self-pay | Admitting: Family Medicine

## 2024-01-29 NOTE — Telephone Encounter (Signed)
 Patient called stating he received a letter from [facility] stating that they are having an issue with his Ensifentrine . The letter just states that they have been trying to contact us  regarding this. Patient would like to know if we can look into this for him.

## 2024-01-30 ENCOUNTER — Other Ambulatory Visit: Payer: Self-pay | Admitting: Internal Medicine

## 2024-01-30 DIAGNOSIS — K219 Gastro-esophageal reflux disease without esophagitis: Secondary | ICD-10-CM

## 2024-01-31 DIAGNOSIS — J479 Bronchiectasis, uncomplicated: Secondary | ICD-10-CM | POA: Diagnosis not present

## 2024-01-31 DIAGNOSIS — J449 Chronic obstructive pulmonary disease, unspecified: Secondary | ICD-10-CM | POA: Diagnosis not present

## 2024-02-02 MED ORDER — ENSIFENTRINE 3 MG/2.5ML IN SUSP: 3.0000 mg | Freq: Two times a day (BID) | RESPIRATORY_TRACT | 1 refills | Status: DC

## 2024-02-02 NOTE — Telephone Encounter (Signed)
 Patient came in and expressed that he was having trouble with getting Ensifentrine . I sent it to [facility] to see if they could get it there per their request as [facility] was having issues getting it.

## 2024-02-17 DIAGNOSIS — M25511 Pain in right shoulder: Secondary | ICD-10-CM | POA: Diagnosis not present

## 2024-02-17 DIAGNOSIS — M5412 Radiculopathy, cervical region: Secondary | ICD-10-CM | POA: Diagnosis not present

## 2024-02-17 DIAGNOSIS — M19011 Primary osteoarthritis, right shoulder: Secondary | ICD-10-CM | POA: Diagnosis not present

## 2024-02-28 ENCOUNTER — Other Ambulatory Visit: Payer: Self-pay | Admitting: Internal Medicine

## 2024-02-28 DIAGNOSIS — N401 Enlarged prostate with lower urinary tract symptoms: Secondary | ICD-10-CM

## 2024-03-02 DIAGNOSIS — J479 Bronchiectasis, uncomplicated: Secondary | ICD-10-CM | POA: Diagnosis not present

## 2024-03-02 DIAGNOSIS — J449 Chronic obstructive pulmonary disease, unspecified: Secondary | ICD-10-CM | POA: Diagnosis not present

## 2024-03-17 ENCOUNTER — Ambulatory Visit: Admitting: Family Medicine

## 2024-03-26 ENCOUNTER — Encounter: Payer: Self-pay | Admitting: Cardiology

## 2024-03-26 ENCOUNTER — Ambulatory Visit: Attending: Cardiology | Admitting: Cardiology

## 2024-03-26 VITALS — BP 122/72 | HR 68 | Ht 70.0 in | Wt 195.0 lb

## 2024-03-26 DIAGNOSIS — I25119 Atherosclerotic heart disease of native coronary artery with unspecified angina pectoris: Secondary | ICD-10-CM

## 2024-03-26 DIAGNOSIS — T466X5A Adverse effect of antihyperlipidemic and antiarteriosclerotic drugs, initial encounter: Secondary | ICD-10-CM

## 2024-03-26 DIAGNOSIS — M791 Myalgia, unspecified site: Secondary | ICD-10-CM | POA: Diagnosis not present

## 2024-03-26 DIAGNOSIS — T466X5D Adverse effect of antihyperlipidemic and antiarteriosclerotic drugs, subsequent encounter: Secondary | ICD-10-CM | POA: Diagnosis not present

## 2024-03-26 DIAGNOSIS — E782 Mixed hyperlipidemia: Secondary | ICD-10-CM

## 2024-03-26 NOTE — Progress Notes (Signed)
 Cardiology Office Note  Date: 03/26/2024   ID: Benjamin [name], DOB 1941-09-05, MRN [id number]  History of Present Illness: Benjamin [name] is an 83 y.o. male last seen in January by Ms. Miriam NP, I reviewed her note.  He is here with his wife for a follow-up visit.  Reports no major change in status.  No recent cough or wheezing, no exertional chest pain.  Still lacks stamina as before.  No palpitations or syncope.  I did review the chart, he is now following with an allergy  specialist, had interval PFTs.  I went over his echocardiogram from December 2024 as well.  We went over his medications.  From a cardiac perspective he remains on Repatha , Lopressor , and as needed nitroglycerin .  I reviewed his lab work.  Physical Exam: VS:  BP 122/72 (BP Location: Right Arm, Cuff Size: Normal)   Pulse 68   Ht 5' 10 (1.778 m)   Wt 195 lb (88.5 kg)   SpO2 94%   BMI 27.98 kg/m , BMI Body mass index is 27.98 kg/m.  Wt Readings from Last 3 Encounters:  03/26/24 195 lb (88.5 kg)  01/14/24 192 lb 1 oz (87.1 kg)  11/25/23 194 lb 12.8 oz (88.4 kg)    General: Patient appears comfortable at rest. HEENT: Conjunctiva and lids normal. Neck: Supple, no elevated JVP or carotid bruits. Lungs: No wheezing, nonlabored breathing at rest. Cardiac: Regular rate and rhythm, no S3, 1/6 systolic murmur. Extremities: No pitting edema.  ECG:  An ECG dated 08/01/2023 was personally reviewed today and demonstrated:  Sinus rhythm with incomplete right bundle branch block, rule out old inferior infarct pattern.  Labwork: 08/25/2023: Magnesium  2.0 11/25/2023: ALT 23; AST 20; BUN 20; Creatinine, Ser 1.08; Hemoglobin 17.1; Platelets 287; Potassium 4.1; Sodium 142; TSH 3.250     Component Value Date/Time   CHOL 91 (L) 11/25/2023 1607   TRIG 159 (H) 11/25/2023 1607   HDL 39 (L) 11/25/2023 1607   CHOLHDL 2.3 11/25/2023 1607   CHOLHDL 2.2 11/18/2022 1018   VLDL 18 11/18/2022 1018   LDLCALC 26 11/25/2023 1607   LDLCALC 20  09/05/2020 0929   Other Studies Reviewed Today:  Echocardiogram 08/25/2023:  1. Left ventricular ejection fraction, by estimation, is 60 to 65%. The  left ventricle has normal function. The left ventricle has no regional  wall motion abnormalities. Left ventricular diastolic parameters are  consistent with Grade I diastolic  dysfunction (impaired relaxation).   2. Right ventricular systolic function is normal. The right ventricular  size is normal. Tricuspid regurgitation signal is inadequate for assessing  PA pressure.   3. The mitral valve is grossly normal. Trivial mitral valve  regurgitation.   4. The aortic valve is tricuspid. Aortic valve regurgitation is not  visualized.   5. Aortic dilatation noted. There is borderline dilatation of the aortic  root, measuring 39 mm.   6. The inferior vena cava is normal in size with greater than 50%  respiratory variability, suggesting right atrial pressure of 3 mmHg.   Assessment and Plan:  1.  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  Lexiscan  Myoview  in October 2023 showed mild ischemia in the basal inferior wall per my review of images with LVEF 71%.  Echocardiogram in December 2024 revealed LVEF 60 to 65% with mild diastolic dysfunction and normal RV contraction.  He reports no angina or interval nitroglycerin  use.  Continue  Lopressor  25 mg twice daily, Repatha  140 mg every 14 days, and as needed nitroglycerin .   2.  Mixed hyperlipidemia with statin myalgias.  He remains on Repatha , LDL 26 in March.  3.  COPD and allergic rhinitis, following with allergy  specialist at this time.  Disposition:  Follow up 6 months.  Signed, Jayson JUDITHANN Sierras, M.D., F.A.C.C. [facility]

## 2024-03-26 NOTE — Patient Instructions (Signed)
 Medication Instructions:  Your physician recommends that you continue on your current medications as directed. Please refer to the Current Medication list given to you today.   Labwork: None today  Testing/Procedures: None today  Follow-Up: 6 months  Any Other Special Instructions Will Be Listed Below (If Applicable).  If you need a refill on your cardiac medications before your next appointment, please call your pharmacy.

## 2024-03-31 ENCOUNTER — Ambulatory Visit: Admitting: Family Medicine

## 2024-04-01 DIAGNOSIS — J449 Chronic obstructive pulmonary disease, unspecified: Secondary | ICD-10-CM | POA: Diagnosis not present

## 2024-04-01 DIAGNOSIS — J479 Bronchiectasis, uncomplicated: Secondary | ICD-10-CM | POA: Diagnosis not present

## 2024-04-01 NOTE — Patient Instructions (Incomplete)
 Asthma/COPD Continue Trelegy 200-1 puff once a day to prevent cough or wheeze Continue Xopenex  via nebulizer or Xopenex  HFA once every 6 hours as needed for cough or wheeze You may use Xopenex  5-15 minutes before activity to decrease cough or wheeze Consider follow up with a pulmonary specialist  Allergic rhinitis Continue allergen avoidance measures directed toward grass pollen, mold, and dust mites as listed below Continue an antihistamine once a day as needed for a runny nose or itch. Remember to rotate to a different antihistamine about every 3 months. Some examples of over the counter antihistamines include Zyrtec (cetirizine), Xyzal (levocetirizine), Allegra (fexofenadine), and Claritin  (loratidine).  Continue Flonase 2 sprays in each nostril once a day as needed for a stuffy nose.  In the right nostril, point the applicator out toward the right ear. In the left nostril, point the applicator out toward the left ear Continue ipratropium 1-2 sprays in each nostril once or twice a day as needed for a runny nose Consider saline nasal rinses as needed for nasal symptoms. Use this before any medicated nasal sprays for best result  Reflux Continue dietary and lifestyle modifications as listed below  Call the clinic if this treatment plan is not working well for you  Follow up in 2 months or sooner if needed.   Lifestyle Changes for Controlling GERD When you have GERD, stomach acid feels as if it's backing up toward your mouth. Whether or not you take medication to control your GERD, your symptoms can often be improved with lifestyle changes.   Raise Your Head Reflux is more likely to strike when you're lying down flat, because stomach fluid can flow backward more easily. Raising the head of your bed 4-6 inches can help. To do this: Slide blocks or books under the legs at the head of your bed. Or, place a wedge under the mattress. Many foam stores can make a suitable wedge for you. The  wedge should run from your waist to the top of your head. Don't just prop your head on several pillows. This increases pressure on your stomach. It can make GERD worse.  Watch Your Eating Habits Certain foods may increase the acid in your stomach or relax the lower esophageal sphincter, making GERD more likely. It's best to avoid the following: Coffee, tea, and carbonated drinks (with and without caffeine) Fatty, fried, or spicy food Mint, chocolate, onions, and tomatoes Any other foods that seem to irritate your stomach or cause you pain  Relieve the Pressure Eat smaller meals, even if you have to eat more often. Don't lie down right after you eat. Wait a few hours for your stomach to empty. Avoid tight belts and tight-fitting clothes. Lose excess weight.  Tobacco and Alcohol Avoid smoking tobacco and drinking alcohol. They can make GERD symptoms worse.  Reducing Pollen Exposure The American Academy of Allergy , Asthma and Immunology suggests the following steps to reduce your exposure to pollen during allergy  seasons. Do not hang sheets or clothing out to dry; pollen may collect on these items. Do not mow lawns or spend time around freshly cut grass; mowing stirs up pollen. Keep windows closed at night.  Keep car windows closed while driving. Minimize morning activities outdoors, a time when pollen counts are usually at their highest. Stay indoors as much as possible when pollen counts or humidity is high and on windy days when pollen tends to remain in the air longer. Use air conditioning when possible.  Many air conditioners have filters  that trap the pollen spores. Use a HEPA room air filter to remove pollen form the indoor air you breathe. Control of Mold Allergen Mold and fungi can grow on a variety of surfaces provided certain temperature and moisture conditions exist.  Outdoor molds grow on plants, decaying vegetation and soil.  The major outdoor mold, Alternaria and  Cladosporium, are found in very high numbers during hot and dry conditions.  Generally, a late Summer - Fall peak is seen for common outdoor fungal spores.  Rain will temporarily lower outdoor mold spore count, but counts rise rapidly when the rainy period ends.  The most important indoor molds are Aspergillus and Penicillium.  Dark, humid and poorly ventilated basements are ideal sites for mold growth.  The next most common sites of mold growth are the bathroom and the kitchen.  Outdoor [employer] Use air conditioning and keep windows closed Avoid exposure to decaying vegetation. Avoid leaf raking. Avoid grain handling. Consider wearing a face mask if working in moldy areas.  Indoor Mold Control Maintain humidity below 50%. Clean washable surfaces with 5% bleach solution. Remove sources e.g. Contaminated carpets.    Control of Dust Mite Allergen Dust mites play a major role in allergic asthma and rhinitis. They occur in environments with high humidity wherever human skin is found. Dust mites absorb humidity from the atmosphere (ie, they do not drink) and feed on organic matter (including shed human and animal skin). Dust mites are a microscopic type of insect that you cannot see with the naked eye. High levels of dust mites have been detected from mattresses, pillows, carpets, upholstered furniture, bed covers, clothes, soft toys and any woven material. The principal allergen of the dust mite is found in its feces. A gram of dust may contain 1,000 mites and 250,000 fecal particles. Mite antigen is easily measured in the air during house cleaning activities. Dust mites do not bite and do not cause harm to humans, other than by triggering allergies/asthma.  Ways to decrease your exposure to dust mites in your home:  1. Encase mattresses, box springs and pillows with a mite-impermeable barrier or cover  2. Wash sheets, blankets and drapes weekly in hot water  (130 F) with detergent and dry  them in a dryer on the hot setting.  3. Have the room cleaned frequently with a vacuum cleaner and a damp dust-mop. For carpeting or rugs, vacuuming with a vacuum cleaner equipped with a high-efficiency particulate air (HEPA) filter. The dust mite allergic individual should not be in a room which is being cleaned and should wait 1 hour after cleaning before going into the room.  4. Do not sleep on upholstered furniture (eg, couches).  5. If possible removing carpeting, upholstered furniture and drapery from the home is ideal. Horizontal blinds should be eliminated in the rooms where the person spends the most time (bedroom, study, television room). Washable vinyl, roller-type shades are optimal.  6. Remove all non-washable stuffed toys from the bedroom. Wash stuffed toys weekly like sheets and blankets above.  7. Reduce indoor humidity to less than 50%. Inexpensive humidity monitors can be purchased at most hardware stores. Do not use a humidifier as can make the problem worse and are not recommended.

## 2024-04-01 NOTE — Progress Notes (Signed)
 [address] [place] [address] Dept: [phone]  FOLLOW UP NOTE  Patient ID: Benjamin [name], male    DOB: November 11, 1940  Age: 83 y.o. MRN: [id number] Date of Office Visit: 04/02/2024  Assessment  Chief Complaint: Follow-up and Breathing Problem  HPI Benjamin [name] is an 83 year old male who presents to the clinic for a follow-up visit.  He was last seen in this clinic on 01/14/2024 by Arlean Mutter, FNP, for evaluation of asthma, allergic rhinitis, and reflux.  He is accompanied by his wife who assists with history.  Visit, he reports his asthma has been moderately well-controlled with improvement in day-to-day breathing.  He does report shortness of breath with any activity for which he uses albuterol  with only moderate relief of symptoms.  Otherwise, he uses Trelegy 200-1 puff once a day and rarely uses Xopenex .  He sleeping 2 pulmonology for evaluation and is mildly interested in returning for reevaluation.  He did not try ensifentrine  by nebulizer that was ordered at his last visit due to difficulty getting the medication and expense.  Allergic rhinitis is reported as moderately well-controlled with occasional nasal symptoms for which she continues Flonase, ipratropium, and an antihistamine as needed. His last environmental allergy  testing was on 03/05/2023 and was positive to grass pollen, mold, and dust mites.    Reflux is reported as well-controlled with no symptoms including vomiting or heartburn.  He is not currently taking medication to control reflux.  His current medications are listed in the chart.  Drug Allergies:  Allergies  Allergen Reactions   Arsenic Swelling    Severe swelling if patient comes in contact    Contrast Media [Iodinated Contrast Media] Swelling and Rash   Nitrofurantoin  Monohyd Macro Itching    Completed the full course while taking Benadryl  for    Statins Rash    Joint pain    Physical Exam: BP 120/60    Physical Exam Vitals reviewed.   Constitutional:      Appearance: Normal appearance.  HENT:     Head: Normocephalic and atraumatic.     Right Ear: Tympanic membrane normal.     Left Ear: Tympanic membrane normal.     Nose:     Comments: Bilateral nares slightly erythematous with thin clear nasal drainage noted.  Pharynx normal.  Ears normal.  Eyes normal.    Mouth/Throat:     Pharynx: Oropharynx is clear.  Eyes:     Conjunctiva/sclera: Conjunctivae normal.  Cardiovascular:     Rate and Rhythm: Normal rate and regular rhythm.     Heart sounds: Normal heart sounds. No murmur heard. Pulmonary:     Effort: Pulmonary effort is normal.     Breath sounds: Normal breath sounds.     Comments: Lungs clear to auscultation Musculoskeletal:        General: Normal range of motion.     Cervical back: Normal range of motion and neck supple.  Skin:    General: Skin is warm and dry.  Neurological:     Mental Status: He is alert and oriented to person, place, and time.  Psychiatric:        Mood and Affect: Mood normal.        Behavior: Behavior normal.        Thought Content: Thought content normal.        Judgment: Judgment normal.     Diagnostics: FVC 1.87 which is 52% of predicted value, FEV1 1.75 which is 66% of predicted value.  Spirometry  indicates possible restriction.  Assessment and Plan: 1. Asthma-COPD overlap syndrome (HCC)   2. Seasonal and perennial allergic rhinitis   3. Gastroesophageal reflux disease, unspecified whether esophagitis present     No orders of the defined types were placed in this encounter.   Patient Instructions  Asthma/COPD Continue Trelegy 200-1 puff once a day to prevent cough or wheeze Begin AirSupra 2 puffs if needed. Do not use more than 12 puffs in a 24 hour time span Refer to pulmonology for further evaluation  Allergic rhinitis Continue allergen avoidance measures directed toward grass pollen, mold, and dust mites as listed below Continue an antihistamine once a day as  needed for a runny nose or itch. Remember to rotate to a different antihistamine about every 3 months. Some examples of over the counter antihistamines include Zyrtec (cetirizine), Xyzal (levocetirizine), Allegra (fexofenadine), and Claritin  (loratidine).  Continue Flonase 2 sprays in each nostril once a day as needed for a stuffy nose.  In the right nostril, point the applicator out toward the right ear. In the left nostril, point the applicator out toward the left ear Continue ipratropium 1-2 sprays in each nostril once or twice a day as needed for a runny nose Consider saline nasal rinses as needed for nasal symptoms. Use this before any medicated nasal sprays for best result  Reflux Continue dietary and lifestyle modifications as listed below  Call the clinic if this treatment plan is not working well for you  Follow up in 6 months or sooner if needed.   Return in about 6 months (around 10/03/2024), or if symptoms worsen or fail to improve.    Thank you for the opportunity to care for this patient.  Please do not hesitate to contact me with questions.  Arlean Mutter, FNP [facility] 

## 2024-04-02 ENCOUNTER — Ambulatory Visit: Admitting: Family Medicine

## 2024-04-02 ENCOUNTER — Encounter: Payer: Self-pay | Admitting: Family Medicine

## 2024-04-02 ENCOUNTER — Other Ambulatory Visit: Payer: Self-pay

## 2024-04-02 VITALS — BP 120/60 | HR 110 | Temp 97.4°F | Resp 16

## 2024-04-02 DIAGNOSIS — J4489 Other specified chronic obstructive pulmonary disease: Secondary | ICD-10-CM

## 2024-04-02 DIAGNOSIS — K219 Gastro-esophageal reflux disease without esophagitis: Secondary | ICD-10-CM

## 2024-04-02 DIAGNOSIS — J302 Other seasonal allergic rhinitis: Secondary | ICD-10-CM | POA: Diagnosis not present

## 2024-04-02 DIAGNOSIS — J3089 Other allergic rhinitis: Secondary | ICD-10-CM

## 2024-04-02 NOTE — Addendum Note (Signed)
 Addended by: Ludmila Ebarb E on: 04/02/2024 04:20 PM   Modules accepted: Orders

## 2024-04-06 ENCOUNTER — Other Ambulatory Visit: Payer: Self-pay

## 2024-04-06 ENCOUNTER — Ambulatory Visit
Admission: EM | Admit: 2024-04-06 | Discharge: 2024-04-06 | Disposition: A | Discharge status: Home / Self Care | Attending: Family Medicine | Admitting: Family Medicine

## 2024-04-06 ENCOUNTER — Encounter: Payer: Self-pay | Admitting: Emergency Medicine

## 2024-04-06 DIAGNOSIS — L03119 Cellulitis of unspecified part of limb: Secondary | ICD-10-CM | POA: Diagnosis not present

## 2024-04-06 DIAGNOSIS — R0902 Hypoxemia: Secondary | ICD-10-CM

## 2024-04-06 DIAGNOSIS — Z23 Encounter for immunization: Secondary | ICD-10-CM | POA: Diagnosis not present

## 2024-04-06 DIAGNOSIS — S61431A Puncture wound without foreign body of right hand, initial encounter: Secondary | ICD-10-CM

## 2024-04-06 MED ORDER — CEFTRIAXONE SODIUM 500 MG IJ SOLR
500.0000 mg | INTRAMUSCULAR | Status: DC
  Administered 2024-04-06: 500 mg via INTRAMUSCULAR

## 2024-04-06 MED ORDER — TETANUS-DIPHTH-ACELL PERTUSSIS 5-2.5-18.5 LF-MCG/0.5 IM SUSY
0.5000 mL | PREFILLED_SYRINGE | Freq: Once | INTRAMUSCULAR | Status: AC
  Administered 2024-04-06: 0.5 mL via INTRAMUSCULAR

## 2024-04-06 MED ORDER — DOXYCYCLINE HYCLATE 100 MG PO CAPS: 100.0000 mg | ORAL_CAPSULE | Freq: Two times a day (BID) | ORAL | 0 refills | Status: DC

## 2024-04-06 NOTE — ED Triage Notes (Signed)
 Pt reports was trying to push door closed on Friday and reports nail went in my hand about 1/2 inch. Pt reports pain and swelling ever since. Last tetanus 2018.

## 2024-04-06 NOTE — Discharge Instructions (Signed)
 Take the full course of antibiotics, keep the area clean, covered until fully healed.  You may do warm Epsom salt soaks, elevate the hand at rest to help with swelling.  We have updated your tetanus shot today.
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
## 2024-04-15 ENCOUNTER — Telehealth: Payer: Self-pay | Admitting: Internal Medicine

## 2024-04-15 NOTE — Telephone Encounter (Signed)
 Rc'd a referral for this PT who saw Dr. Darlean 2 years ago. He said he did not want to see Dr. Darlean again in the [facility] office. Would like to come to [facility] with a new Pulm. Please ask Dr. Darlean if this is OK?

## 2024-04-15 NOTE — Telephone Encounter (Signed)
 Please schedule patient with Dr.Desai per referral.Dr.Wert gave the okay

## 2024-04-15 NOTE — Telephone Encounter (Signed)
 Referral states he is meant to see Dr.Desai,will send message to dr.wert as an okay but patient needs to be scheduled with Dr.Desai per the referral   Dr.Wert Is it okay to switch to see Dr.Desai here in [place] location?Saw you back in 2023

## 2024-04-26 NOTE — Telephone Encounter (Signed)
 PT wanted soonest appt avail. [facility] appt for this month. NFN

## 2024-04-28 ENCOUNTER — Other Ambulatory Visit: Payer: Self-pay | Admitting: Allergy & Immunology

## 2024-05-02 DIAGNOSIS — J449 Chronic obstructive pulmonary disease, unspecified: Secondary | ICD-10-CM | POA: Diagnosis not present

## 2024-05-02 DIAGNOSIS — J479 Bronchiectasis, uncomplicated: Secondary | ICD-10-CM | POA: Diagnosis not present

## 2024-05-04 ENCOUNTER — Telehealth: Payer: Self-pay | Admitting: Family Medicine

## 2024-05-04 NOTE — Telephone Encounter (Signed)
 Ridge is scheduled with Dr. Theodoro on 8/27 at 3:00 pm-Pulmonology

## 2024-05-12 ENCOUNTER — Ambulatory Visit

## 2024-05-12 ENCOUNTER — Ambulatory Visit (INDEPENDENT_AMBULATORY_CARE_PROVIDER_SITE_OTHER)

## 2024-05-12 VITALS — BP 108/80 | HR 93 | Ht 71.0 in | Wt 190.4 lb

## 2024-05-12 DIAGNOSIS — R0609 Other forms of dyspnea: Secondary | ICD-10-CM | POA: Diagnosis not present

## 2024-05-12 DIAGNOSIS — J301 Allergic rhinitis due to pollen: Secondary | ICD-10-CM | POA: Diagnosis not present

## 2024-05-12 DIAGNOSIS — J4489 Other specified chronic obstructive pulmonary disease: Secondary | ICD-10-CM | POA: Diagnosis not present

## 2024-05-12 DIAGNOSIS — R0602 Shortness of breath: Secondary | ICD-10-CM | POA: Diagnosis not present

## 2024-05-12 DIAGNOSIS — I5032 Chronic diastolic (congestive) heart failure: Secondary | ICD-10-CM | POA: Diagnosis not present
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
## 2024-05-12 NOTE — Patient Instructions (Signed)
 DUPIXENT  injection: We would like to begin biologic therapy injections for your COPD. The medication we would like to start is Dupixent .  Dupixent  has been proven to prevent severe COPD exacerbations, reduce oral steroid use and improve your lung function in as little as 2 weeks. Dupixent  is a special injection administered every 2 weeks that targets the inflammatory pathway in your lungs. It is not a steroid or an inhaler and will be used in addition to your current asthma therapies.  Dupixent  is very well tolerated in most patients. The most common side effects include injection site reactions, sore throat and high counts of a certain white blood cell called eosinophils. More rarely, Dupixent  can cause serious side effects including allergic reactions, inflammation of your blood vessels or joint aches and pains.  Following the first injection, you or your caregiver will administer Dupixent  at home. The pre-filled syringe should be warmed to room temperature for 45 minutes prior to use. It should be injected into your thigh, stomach (except for the 2 inches surrounding your belly button) or the outer area of your upper arm if a caregiver is administering it. A different site should be used each time you inject Dupixent . Dupixent  should not be heated in microwave, hot water  or direct sunlight. Needle cap should not be removed while allowing product to reach room temperature. Dupixent  should not be shaken. Do not use if the solution is discolored or contains particulate matter. Do not administer if the window on the pre-filled syringe is yellow, which indicates that the pen has been used. Dupixent  may be left out of the refrigerator at room temperature for up to 14 days in the original carton. Administer Dupixent  within 14 days of removing from the refrigerator or discard into a sharps container.    A minimum of 4 months of treatment is suggested to determine efficacy. We will initiate 600 mg once (given as  two 300mg  injections), followed by 300mg  every other week. Have your EpiPen  nearby for every injection in case of severe allergic reaction.  Please inform your pulmonologist if you have a parasitic infection, are scheduled to receive any vaccinations, are pregnant or plan to become pregnant or are breastfeeding or plan to breastfeed. Please note that you should not receive a live vaccine right before and during treatment with Dupixent .  For more information please visit: [email or web address] For further assistance please visit: [email or web address]   ALSO WE WILL GET CXR, AND LAB TEST TO EVALUATE YOUR HEART CONDITION.

## 2024-05-13 ENCOUNTER — Telehealth: Payer: Self-pay

## 2024-05-13 ENCOUNTER — Ambulatory Visit: Payer: Self-pay

## 2024-05-13 DIAGNOSIS — J4489 Other specified chronic obstructive pulmonary disease: Secondary | ICD-10-CM

## 2024-05-13 DIAGNOSIS — J301 Allergic rhinitis due to pollen: Secondary | ICD-10-CM

## 2024-05-13 LAB — BRAIN NATRIURETIC PEPTIDE: Pro B Natriuretic peptide (BNP): 38 pg/mL (ref 0.0–100.0)

## 2024-05-13 NOTE — Telephone Encounter (Signed)
 Submitted a Prior Authorization request to [facility] for DUPIXENT  via CoverMyMeds. Will update once we receive a response.  Key: [id number]

## 2024-05-18 NOTE — Telephone Encounter (Signed)
 Received a fax regarding Prior Authorization from [facility] for DUPIXENT . Authorization has been DENIED because the use of this medication without an eosinophil count greater than or equal to 150 cells per microliter in the past 90 days or an eosinophil count greater than or equal to 300 cells per microliter in the past 12 months does not establish medical necessity for this drug.  Phone# [phone] Fax# [phone]  Denial letter has been sent to scan center

## 2024-05-19 NOTE — Telephone Encounter (Signed)
 We would need updated labs for his eosinophil count because he met the criteria of having it be at least 150 within 3 months back in March. That was his most recent lab work but it is not recent enough to fit the criteria of it having to be within 3 months.

## 2024-05-19 NOTE — Telephone Encounter (Addendum)
 Per [facility] Dupixent  has been denied because patient does not fit the criteria of needing to have an eosinophil count greater than or equal to 150 within the past 3 months, or an eosinophil count of greater than or equal to 300 within the past 12 months.  Patient's last CBC with diff was done on 11/25/23 and eosinophil count was 200. Based off of this there is the option to appeal or to consider if Nucala may be a better fit.  Please advise

## 2024-05-20 ENCOUNTER — Encounter: Payer: Self-pay | Admitting: Emergency Medicine

## 2024-05-20 ENCOUNTER — Ambulatory Visit
Admission: EM | Admit: 2024-05-20 | Discharge: 2024-05-20 | Disposition: A | Discharge status: Home / Self Care | Attending: Nurse Practitioner | Admitting: Nurse Practitioner

## 2024-05-20 DIAGNOSIS — L299 Pruritus, unspecified: Secondary | ICD-10-CM

## 2024-05-20 MED ORDER — TRIAMCINOLONE ACETONIDE 0.1 % EX CREA: 1.0000 | TOPICAL_CREAM | Freq: Two times a day (BID) | CUTANEOUS | 0 refills | Status: DC

## 2024-05-20 MED ORDER — PREDNISONE 20 MG PO TABS: 40.0000 mg | ORAL_TABLET | Freq: Every day | ORAL | 0 refills | Status: AC

## 2024-05-20 MED ORDER — DEXAMETHASONE SODIUM PHOSPHATE 10 MG/ML IJ SOLN
10.0000 mg | INTRAMUSCULAR | Status: AC
  Administered 2024-05-20: 10 mg via INTRAMUSCULAR
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
## 2024-05-20 NOTE — Discharge Instructions (Signed)
 You have been given an injection of Decadron  10 mg.  Start the prednisone  tomorrow. Take medication as prescribed. You may take over-the-counter Zyrtec during the daytime and Benadryl  at bedtime as needed for itching. Avoid hot baths or showers while symptoms persist.  Recommend lukewarm baths or showers. Avoid scratching or manipulating the areas while symptoms persist. If symptoms fail to improve with this treatment, recommend follow-up with your primary care physician for further evaluation. Follow-up as needed.

## 2024-05-20 NOTE — ED Triage Notes (Signed)
 Rash from right ear down to knee and across back since this morning.  States rash itches.

## 2024-05-20 NOTE — Telephone Encounter (Signed)
 Pt notified on AM (DPR)

## 2024-05-20 NOTE — Telephone Encounter (Signed)
 From what I can see his last absolute eosinophil count was 200 so if we could have him come in to do another CBC w/diff that would be great.

## 2024-05-21 ENCOUNTER — Telehealth: Payer: Self-pay

## 2024-05-21 NOTE — Telephone Encounter (Signed)
 Copied from CRM [id number]. Topic: Clinical - Request for Lab/Test Order >> May 21, 2024  1:01 PM Santiya F wrote: Reason for CRM: Patient is calling in because he has a physical scheduled but wanted to know if he could have his lab orders sent over to [facility] so they can have the labs done on Monday 05/24/24 morning since he will already be there. Please advise.

## 2024-05-21 NOTE — Telephone Encounter (Signed)
 Copied from CRM [id number]. Topic: General - Other >> May 20, 2024  5:54 PM [place] S wrote: Reason for CRM: patient calling for update on dupixent , saw in chart review the person on his dpr was contacted about him coming in to get cbc labs but patient was unaware, please specifically call patient to let him know when it is okay to come in for cbc labs.    Called and spoke with the patient. Pt is going to have labs re-drawn at [facility] as this is closer for him. Order has been placed. Nfn

## 2024-05-24 DIAGNOSIS — R7303 Prediabetes: Secondary | ICD-10-CM | POA: Diagnosis not present

## 2024-05-24 DIAGNOSIS — E785 Hyperlipidemia, unspecified: Secondary | ICD-10-CM | POA: Diagnosis not present

## 2024-05-24 DIAGNOSIS — E559 Vitamin D deficiency, unspecified: Secondary | ICD-10-CM | POA: Diagnosis not present

## 2024-05-24 DIAGNOSIS — J4489 Other specified chronic obstructive pulmonary disease: Secondary | ICD-10-CM | POA: Diagnosis not present

## 2024-05-24 DIAGNOSIS — I5032 Chronic diastolic (congestive) heart failure: Secondary | ICD-10-CM | POA: Diagnosis not present

## 2024-05-24 NOTE — Progress Notes (Signed)
 Called and spoke with patient, he verbalized understanding.  He stated he has had his lab work done for [employer].  He said the insurance company wanted more information to cover the medication that was prescribed for him.  Advised him we would be back in contact once we have a decision from his insurance company.  Nothing further needed.

## 2024-05-25 LAB — CMP14+EGFR
ALT: 24 IU/L (ref 0–44)
AST: 15 IU/L (ref 0–40)
Albumin: 4.1 g/dL (ref 3.7–4.7)
Alkaline Phosphatase: 52 IU/L (ref 44–121)
BUN/Creatinine Ratio: 22 (ref 10–24)
BUN: 26 mg/dL (ref 8–27)
Bilirubin Total: 0.9 mg/dL (ref 0.0–1.2)
CO2: 22 mmol/L (ref 20–29)
Calcium: 9.9 mg/dL (ref 8.6–10.2)
Chloride: 102 mmol/L (ref 96–106)
Creatinine, Ser: 1.16 mg/dL (ref 0.76–1.27)
Globulin, Total: 2 g/dL (ref 1.5–4.5)
Glucose: 107 mg/dL — ABNORMAL HIGH (ref 70–99)
Potassium: 3.6 mmol/L (ref 3.5–5.2)
Sodium: 140 mmol/L (ref 134–144)
Total Protein: 6.1 g/dL (ref 6.0–8.5)
eGFR: 63 mL/min/1.73 (ref >59)

## 2024-05-25 LAB — CBC WITH DIFFERENTIAL/PLATELET
Basophils Absolute: 0 x10E3/uL (ref 0.0–0.2)
Basos: 0 %
EOS (ABSOLUTE): 0.1 x10E3/uL (ref 0.0–0.4)
Eos: 0 %
Hematocrit: 49 % (ref 37.5–51.0)
Hemoglobin: 16.2 g/dL (ref 13.0–17.7)
Immature Grans (Abs): 0.1 x10E3/uL (ref 0.0–0.1)
Immature Granulocytes: 1 %
Lymphocytes Absolute: 2.2 x10E3/uL (ref 0.7–3.1)
Lymphs: 18 %
MCH: 32.1 pg (ref 26.6–33.0)
MCHC: 33.1 g/dL (ref 31.5–35.7)
MCV: 97 fL (ref 79–97)
Monocytes Absolute: 1 x10E3/uL — ABNORMAL HIGH (ref 0.1–0.9)
Monocytes: 9 %
Neutrophils Absolute: 8.7 x10E3/uL — ABNORMAL HIGH (ref 1.4–7.0)
Neutrophils: 72 %
Platelets: 315 x10E3/uL (ref 150–450)
RBC: 5.05 x10E6/uL (ref 4.14–5.80)
RDW: 13.5 % (ref 11.6–15.4)
WBC: 12.1 x10E3/uL — ABNORMAL HIGH (ref 3.4–10.8)

## 2024-05-25 LAB — HEMOGLOBIN A1C
Est. average glucose Bld gHb Est-mCnc: 126 mg/dL
Hgb A1c MFr Bld: 6 % — ABNORMAL HIGH (ref 4.8–5.6)

## 2024-05-25 LAB — LIPID PANEL
Chol/HDL Ratio: 2 ratio (ref 0.0–5.0)
Cholesterol, Total: 99 mg/dL — ABNORMAL LOW (ref 100–199)
HDL: 49 mg/dL (ref >39)
LDL Chol Calc (NIH): 28 mg/dL (ref 0–99)
Triglycerides: 126 mg/dL (ref 0–149)
VLDL Cholesterol Cal: 22 mg/dL (ref 5–40)

## 2024-05-25 LAB — VITAMIN D 25 HYDROXY (VIT D DEFICIENCY, FRACTURES): Vit D, 25-Hydroxy: 75.5 ng/mL (ref 30.0–100.0)

## 2024-05-25 LAB — TSH: TSH: 2.82 u[IU]/mL (ref 0.450–4.500)

## 2024-05-28 ENCOUNTER — Telehealth: Payer: Self-pay

## 2024-05-28 NOTE — Telephone Encounter (Signed)
 Will initiate Nucala Biv in a new encounter

## 2024-05-28 NOTE — Telephone Encounter (Signed)
 Abs eos on 05/24/2024 was 100. Can try for Nucala for COPD (as primary dx) and asthma (as secondary dx)

## 2024-05-28 NOTE — Telephone Encounter (Signed)
 Submitted a Prior Authorization request to [facility] for NUCALA via CoverMyMeds. Will update once we receive a response.  Key: [id number]

## 2024-05-31 NOTE — Telephone Encounter (Signed)
 Received notification that previous pa was closed out and we were faxed a form to complete the pa. Filled out form and sent with chart notes, faxed to [employer] [phone].

## 2024-06-01 ENCOUNTER — Ambulatory Visit: Admitting: Internal Medicine

## 2024-06-01 ENCOUNTER — Encounter: Payer: Self-pay | Admitting: Internal Medicine

## 2024-06-01 VITALS — BP 94/64 | HR 85 | Ht 71.0 in | Wt 184.6 lb

## 2024-06-01 DIAGNOSIS — R7303 Prediabetes: Secondary | ICD-10-CM

## 2024-06-01 DIAGNOSIS — I2510 Atherosclerotic heart disease of native coronary artery without angina pectoris: Secondary | ICD-10-CM | POA: Diagnosis not present

## 2024-06-01 DIAGNOSIS — I5032 Chronic diastolic (congestive) heart failure: Secondary | ICD-10-CM

## 2024-06-01 DIAGNOSIS — J329 Chronic sinusitis, unspecified: Secondary | ICD-10-CM

## 2024-06-01 DIAGNOSIS — R269 Unspecified abnormalities of gait and mobility: Secondary | ICD-10-CM

## 2024-06-01 DIAGNOSIS — M51362 Other intervertebral disc degeneration, lumbar region with discogenic back pain and lower extremity pain: Secondary | ICD-10-CM

## 2024-06-01 DIAGNOSIS — E785 Hyperlipidemia, unspecified: Secondary | ICD-10-CM

## 2024-06-01 DIAGNOSIS — Z23 Encounter for immunization: Secondary | ICD-10-CM | POA: Diagnosis not present

## 2024-06-01 DIAGNOSIS — E876 Hypokalemia: Secondary | ICD-10-CM

## 2024-06-01 DIAGNOSIS — N401 Enlarged prostate with lower urinary tract symptoms: Secondary | ICD-10-CM

## 2024-06-01 DIAGNOSIS — R351 Nocturia: Secondary | ICD-10-CM

## 2024-06-01 DIAGNOSIS — J4489 Other specified chronic obstructive pulmonary disease: Secondary | ICD-10-CM

## 2024-06-01 MED ORDER — TRAMADOL HCL 50 MG PO TABS: 50.0000 mg | ORAL_TABLET | Freq: Two times a day (BID) | ORAL | 0 refills | Status: DC | PRN

## 2024-06-01 MED ORDER — POTASSIUM CHLORIDE CRYS ER 20 MEQ PO TBCR: 20.0000 meq | EXTENDED_RELEASE_TABLET | Freq: Every day | ORAL | 3 refills | Status: DC

## 2024-06-01 NOTE — Assessment & Plan Note (Signed)
 Has a history of HFpEF Has mild leg swelling He had recent cardiology evaluation Has Lasix as needed for leg swelling or dyspnea

## 2024-06-01 NOTE — Assessment & Plan Note (Signed)
S/p CABG Followed by cardiology On metoprolol Denies chest pain or dyspnea currently On Repatha -has statin intolerance 

## 2024-06-01 NOTE — Assessment & Plan Note (Signed)
 Overall well-controlled with Trelegy now Xopenex  inhaler and nebulizer as needed for dyspnea or wheezing Has oxygen  desaturation upon ambulation, has home O2 for as needed use/upon ambulation Followed by pulmonology and Allergy  specialist - was on Dupixent , but planned to start a different agent

## 2024-06-01 NOTE — Assessment & Plan Note (Signed)
 Lab Results  Component Value Date   HGBA1C 6.0 (H) 05/24/2024   Advised to follow low carb diet for now

## 2024-06-01 NOTE — Patient Instructions (Signed)
 Please take Potassium supplement with Lasix .  Please perform leg elevation as tolerated for leg swelling.  Please continue to take medications as prescribed.  Please continue to follow low carb diet and ambulate as tolerated.  Please get fasting blood tests done before the next visit.

## 2024-06-01 NOTE — Assessment & Plan Note (Signed)
 Uncontrolled with Flomax 0.8 mg QD and finasteride 5 mg once daily, still has nocturia S/p TURP Followed by Urology

## 2024-06-01 NOTE — Assessment & Plan Note (Signed)
On Repatha -has statin intolerance
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
## 2024-06-01 NOTE — Assessment & Plan Note (Signed)
He has persistent symptoms despite symptomatic treatment Singulair for allergies, avoid daily decongestant use Atrovent nasal spray PRN Advised to use humidifier or vaporizer at home Followed by allergy and immunology

## 2024-06-02 DIAGNOSIS — J479 Bronchiectasis, uncomplicated: Secondary | ICD-10-CM | POA: Diagnosis not present

## 2024-06-02 DIAGNOSIS — J449 Chronic obstructive pulmonary disease, unspecified: Secondary | ICD-10-CM | POA: Diagnosis not present

## 2024-06-02 LAB — MICROALBUMIN / CREATININE URINE RATIO
Creatinine, Urine: 151.7 mg/dL
Microalb/Creat Ratio: 3 mg/g{creat} (ref 0–29)
Microalbumin, Urine: 4.5 ug/mL

## 2024-06-02 NOTE — Telephone Encounter (Signed)
 Received a fax regarding Prior Authorization from [employer] for [employer]. Authorization has been DENIED because:     Phone# [phone]  We are unable to make a justification for the patient having oral corticosteroid dependent COPD based on the chart notes and he does not meet the eosinophil requirement. Chart notes also state he has not had any recent exacerbations.   Please advise on next steps or alternative medications to pursue. Pt has also been denied for Dupixent .

## 2024-06-03 ENCOUNTER — Emergency Department (HOSPITAL_COMMUNITY)

## 2024-06-03 ENCOUNTER — Encounter (HOSPITAL_COMMUNITY): Payer: Self-pay | Admitting: Emergency Medicine

## 2024-06-03 ENCOUNTER — Other Ambulatory Visit: Payer: Self-pay

## 2024-06-03 ENCOUNTER — Emergency Department (HOSPITAL_COMMUNITY)
Admission: EM | Admit: 2024-06-03 | Discharge: 2024-06-04 | Disposition: A | Discharge status: Home / Self Care | Attending: Emergency Medicine | Admitting: Emergency Medicine

## 2024-06-03 DIAGNOSIS — J441 Chronic obstructive pulmonary disease with (acute) exacerbation: Secondary | ICD-10-CM | POA: Insufficient documentation

## 2024-06-03 DIAGNOSIS — J439 Emphysema, unspecified: Secondary | ICD-10-CM | POA: Diagnosis not present

## 2024-06-03 DIAGNOSIS — R079 Chest pain, unspecified: Secondary | ICD-10-CM | POA: Diagnosis not present

## 2024-06-03 DIAGNOSIS — D72829 Elevated white blood cell count, unspecified: Secondary | ICD-10-CM | POA: Insufficient documentation

## 2024-06-03 DIAGNOSIS — Z951 Presence of aortocoronary bypass graft: Secondary | ICD-10-CM | POA: Diagnosis not present

## 2024-06-03 DIAGNOSIS — Z85828 Personal history of other malignant neoplasm of skin: Secondary | ICD-10-CM | POA: Insufficient documentation

## 2024-06-03 DIAGNOSIS — I700 Atherosclerosis of aorta: Secondary | ICD-10-CM | POA: Diagnosis not present

## 2024-06-03 DIAGNOSIS — I2510 Atherosclerotic heart disease of native coronary artery without angina pectoris: Secondary | ICD-10-CM | POA: Diagnosis not present

## 2024-06-03 DIAGNOSIS — F17210 Nicotine dependence, cigarettes, uncomplicated: Secondary | ICD-10-CM | POA: Diagnosis not present

## 2024-06-03 DIAGNOSIS — I5032 Chronic diastolic (congestive) heart failure: Secondary | ICD-10-CM | POA: Insufficient documentation

## 2024-06-03 DIAGNOSIS — J9811 Atelectasis: Secondary | ICD-10-CM | POA: Diagnosis not present

## 2024-06-03 DIAGNOSIS — R0789 Other chest pain: Secondary | ICD-10-CM | POA: Diagnosis not present

## 2024-06-03 DIAGNOSIS — R0600 Dyspnea, unspecified: Secondary | ICD-10-CM

## 2024-06-03 LAB — CBC
HCT: 48.6 % (ref 39.0–52.0)
Hemoglobin: 16.5 g/dL (ref 13.0–17.0)
MCH: 32.4 pg (ref 26.0–34.0)
MCHC: 34 g/dL (ref 30.0–36.0)
MCV: 95.5 fL (ref 80.0–100.0)
Platelets: 226 K/uL (ref 150–400)
RBC: 5.09 MIL/uL (ref 4.22–5.81)
RDW: 13.4 % (ref 11.5–15.5)
WBC: 11.1 K/uL — ABNORMAL HIGH (ref 4.0–10.5)
nRBC: 0 % (ref 0.0–0.2)

## 2024-06-03 LAB — BASIC METABOLIC PANEL WITH GFR
Anion gap: 13 (ref 5–15)
BUN: 18 mg/dL (ref 8–23)
CO2: 21 mmol/L — ABNORMAL LOW (ref 22–32)
Calcium: 8.8 mg/dL — ABNORMAL LOW (ref 8.9–10.3)
Chloride: 109 mmol/L (ref 98–111)
Creatinine, Ser: 1.14 mg/dL (ref 0.61–1.24)
GFR, Estimated: >60 mL/min (ref >60)
Glucose, Bld: 105 mg/dL — ABNORMAL HIGH (ref 70–99)
Potassium: 3.8 mmol/L (ref 3.5–5.1)
Sodium: 143 mmol/L (ref 135–145)

## 2024-06-03 LAB — HEPATIC FUNCTION PANEL
ALT: 25 U/L (ref 0–44)
AST: 18 U/L (ref 15–41)
Albumin: 3.3 g/dL — ABNORMAL LOW (ref 3.5–5.0)
Alkaline Phosphatase: 40 U/L (ref 38–126)
Bilirubin, Direct: 0.2 mg/dL (ref 0.0–0.2)
Indirect Bilirubin: 1 mg/dL — ABNORMAL HIGH (ref 0.3–0.9)
Total Bilirubin: 1.2 mg/dL (ref 0.0–1.2)
Total Protein: 5.9 g/dL — ABNORMAL LOW (ref 6.5–8.1)

## 2024-06-03 LAB — LIPASE, BLOOD: Lipase: 34 U/L (ref 11–51)

## 2024-06-03 LAB — RESP PANEL BY RT-PCR (RSV, FLU A&B, COVID)  RVPGX2
Influenza A by PCR: NEGATIVE
Influenza B by PCR: NEGATIVE
Resp Syncytial Virus by PCR: NEGATIVE
SARS Coronavirus 2 by RT PCR: NEGATIVE

## 2024-06-03 LAB — TROPONIN I (HIGH SENSITIVITY)
Troponin I (High Sensitivity): 5 ng/L (ref <18)
Troponin I (High Sensitivity): 4 ng/L (ref <18)

## 2024-06-03 MED ORDER — METHYLPREDNISOLONE SODIUM SUCC 125 MG IJ SOLR
62.5000 mg | Freq: Once | INTRAMUSCULAR | Status: AC
  Administered 2024-06-03: 62.5 mg via INTRAVENOUS
  Filled 2024-06-03: qty 2

## 2024-06-03 MED ORDER — SODIUM CHLORIDE 0.9 % IV BOLUS: 1000.0000 mL | Freq: Once | INTRAVENOUS | Status: DC

## 2024-06-03 MED ORDER — DIPHENHYDRAMINE HCL 25 MG PO CAPS: 50.0000 mg | ORAL_CAPSULE | Freq: Once | ORAL | Status: AC

## 2024-06-03 MED ORDER — IOHEXOL 350 MG/ML SOLN
75.0000 mL | Freq: Once | INTRAVENOUS | Status: AC | PRN
  Administered 2024-06-03: 75 mL via INTRAVENOUS

## 2024-06-03 MED ORDER — ALUM & MAG HYDROXIDE-SIMETH 200-200-20 MG/5ML PO SUSP
30.0000 mL | Freq: Once | ORAL | Status: AC
  Administered 2024-06-03: 30 mL via ORAL
  Filled 2024-06-03: qty 30

## 2024-06-03 MED ORDER — ONDANSETRON HCL 4 MG/2ML IJ SOLN
2.0000 mg | Freq: Once | INTRAMUSCULAR | Status: AC
  Administered 2024-06-03: 2 mg via INTRAVENOUS
  Filled 2024-06-03: qty 2

## 2024-06-03 MED ORDER — IPRATROPIUM-ALBUTEROL 0.5-2.5 (3) MG/3ML IN SOLN
3.0000 mL | Freq: Once | RESPIRATORY_TRACT | Status: AC
  Administered 2024-06-03: 3 mL via RESPIRATORY_TRACT
  Filled 2024-06-03: qty 3

## 2024-06-03 MED ORDER — METHYLPREDNISOLONE SODIUM SUCC 40 MG IJ SOLR
40.0000 mg | Freq: Once | INTRAMUSCULAR | Status: AC
  Administered 2024-06-03: 40 mg via INTRAVENOUS
  Filled 2024-06-03: qty 1

## 2024-06-03 MED ORDER — MORPHINE SULFATE (PF) 4 MG/ML IV SOLN
4.0000 mg | Freq: Once | INTRAVENOUS | Status: AC
  Administered 2024-06-03: 4 mg via INTRAVENOUS
  Filled 2024-06-03: qty 1

## 2024-06-03 MED ORDER — SODIUM CHLORIDE 0.9 % IV BOLUS
500.0000 mL | Freq: Once | INTRAVENOUS | Status: AC
  Administered 2024-06-03: 500 mL via INTRAVENOUS

## 2024-06-03 MED ORDER — DIPHENHYDRAMINE HCL 50 MG/ML IJ SOLN
50.0000 mg | Freq: Once | INTRAMUSCULAR | Status: AC
  Administered 2024-06-03: 50 mg via INTRAVENOUS
  Filled 2024-06-03: qty 1

## 2024-06-03 NOTE — ED Notes (Signed)
 ED Provider at bedside.

## 2024-06-03 NOTE — ED Provider Notes (Signed)
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    Final Clinical Impression(s) / ED Diagnoses Final diagnoses:  Chest pain, unspecified type  Dyspnea, unspecified type  COPD exacerbation (HCC)        Elnor Jayson LABOR, DO 06/08/24 1537

## 2024-06-03 NOTE — ED Triage Notes (Signed)
 Patient arrives by POV c/o central chest pain onset of 3:45 pm today. Took one nitroglycerin  PTA w/o relief. Patient describes it as Garrel Champ hitting me in the chest. Pain is non radiating. Family states patient has been c/o aches and pains all over for months.  Patient adds that he used his inhaler PTA.

## 2024-06-03 NOTE — ED Notes (Signed)
Contrast prep

## 2024-06-03 NOTE — ED Notes (Signed)
 Patient transported to CT

## 2024-06-04 DIAGNOSIS — R269 Unspecified abnormalities of gait and mobility: Secondary | ICD-10-CM | POA: Insufficient documentation

## 2024-06-04 MED ORDER — AZITHROMYCIN 250 MG PO TABS: 250.0000 mg | ORAL_TABLET | Freq: Every day | ORAL | 0 refills | Status: DC

## 2024-06-04 MED ORDER — PREDNISONE 10 MG (21) PO TBPK: ORAL_TABLET | Freq: Every day | ORAL | 0 refills | Status: DC

## 2024-06-04 MED ORDER — SUCRALFATE 1 G PO TABS: 1.0000 g | ORAL_TABLET | Freq: Three times a day (TID) | ORAL | 0 refills | Status: DC

## 2024-06-04 MED ORDER — TAMSULOSIN HCL 0.4 MG PO CAPS
ORAL_CAPSULE | ORAL | 3 refills | Status: AC
Start: 2024-06-04 — End: ?

## 2024-06-04 NOTE — Assessment & Plan Note (Signed)
 He has leg cramps, likely due to Lasix  induced hypokalemia Potassium supplement prescribed

## 2024-06-04 NOTE — Assessment & Plan Note (Addendum)
 Likely multifactorial, due to DDD of lumbar spine, OA of knee and muscular weakness related to age Advised to use walking support to prevent a fall - he has a cane Offered physical therapy, but he prefers to avoid for now Advised to perform simple back exercises at home

## 2024-06-04 NOTE — Discharge Instructions (Addendum)
 It was a pleasure caring for you today in the emergency department.  Please follow up with your cardiologist and your PCP  Please continue to use your home oxygen  as prescribed  Return to the Emergency Department if you have unusual chest pain, pressure, or discomfort, shortness of breath, nausea, vomiting, burping, heartburn, tingling upper body parts, sweating, cold, clammy skin, or racing heartbeat. Call 911 if you think you are having a heart attack. Take all cardiac medications as prescribed - notify your doctor if you have any side effects. Follow cardiac diet - avoid fatty & fried foods, don't eat too much red meat, eat lots of fruits & vegetables, and dairy products should be low fat. Please lose weight if you are overweight. Become more active with walking, gardening, or any other activity that gets you to moving.   Please return to the emergency department immediately for any new or concerning symptoms, or if you get worse.

## 2024-06-04 NOTE — Assessment & Plan Note (Signed)
 Has chronic low back pain and radiating bilateral LE pain Has tried Tylenol  and oral NSAIDs without much relief Tramadol  as needed for severe pain

## 2024-06-21 DIAGNOSIS — M75101 Unspecified rotator cuff tear or rupture of right shoulder, not specified as traumatic: Secondary | ICD-10-CM | POA: Diagnosis not present

## 2024-06-21 DIAGNOSIS — M25561 Pain in right knee: Secondary | ICD-10-CM | POA: Diagnosis not present

## 2024-06-21 DIAGNOSIS — M25511 Pain in right shoulder: Secondary | ICD-10-CM | POA: Diagnosis not present

## 2024-06-21 DIAGNOSIS — M1711 Unilateral primary osteoarthritis, right knee: Secondary | ICD-10-CM | POA: Diagnosis not present

## 2024-06-21 DIAGNOSIS — M19011 Primary osteoarthritis, right shoulder: Secondary | ICD-10-CM | POA: Diagnosis not present

## 2024-06-22 ENCOUNTER — Telehealth: Payer: Self-pay | Admitting: Pharmacy Technician

## 2024-06-22 NOTE — Telephone Encounter (Signed)
   Pharmacy Patient Advocate Encounter   Received notification from CoverMyMeds that prior authorization for repatha  is required/requested.   Insurance verification completed.   The patient is insured through [facility].   Per test claim: PA required; PA submitted to above mentioned insurance via Latent Key/confirmation #/EOC [id number] Status is pending

## 2024-06-23 NOTE — Telephone Encounter (Signed)
 Pharmacy Patient Advocate Encounter  Received notification from [facility] that Prior Authorization for repatha  has been APPROVED from 06/22/24 to 06/22/25   PA #/Case ID/Reference #: [id number]

## 2024-06-30 ENCOUNTER — Ambulatory Visit

## 2024-07-02 ENCOUNTER — Ambulatory Visit

## 2024-07-02 VITALS — BP 115/80 | HR 58 | Temp 97.8°F | Ht 71.0 in | Wt 189.6 lb

## 2024-07-02 DIAGNOSIS — J479 Bronchiectasis, uncomplicated: Secondary | ICD-10-CM | POA: Diagnosis not present

## 2024-07-02 DIAGNOSIS — I5032 Chronic diastolic (congestive) heart failure: Secondary | ICD-10-CM | POA: Diagnosis not present

## 2024-07-02 DIAGNOSIS — J4489 Other specified chronic obstructive pulmonary disease: Secondary | ICD-10-CM | POA: Diagnosis not present

## 2024-07-02 DIAGNOSIS — J449 Chronic obstructive pulmonary disease, unspecified: Secondary | ICD-10-CM | POA: Diagnosis not present

## 2024-07-02 DIAGNOSIS — J301 Allergic rhinitis due to pollen: Secondary | ICD-10-CM | POA: Diagnosis not present

## 2024-07-02 NOTE — Patient Instructions (Signed)
  VISIT SUMMARY: You visited today to manage your chronic obstructive pulmonary disease (COPD). We discussed your history of COPD, recent emergency room visit, and current symptoms. Your oxygen  levels have been stable around 90-91%, and you are currently using Trelegy and an albuterol  inhaler.  YOUR PLAN: -CHRONIC OBSTRUCTIVE PULMONARY DISEASE (COPD): COPD is a chronic lung condition that makes it hard to breathe, often caused by smoking. Your recent CT scans show significant lung damage. We will start you on an Ensifentrin nebulizer,twice daily, pending insurance approval. Continue using Trelegy as scheduled and use your albuterol  or levalbuterol  inhaler as needed. You will be referred to pulmonary rehab to help improve your exercise tolerance and overall health. Please monitor for any side effects from the ensifentrin, especially increased shortness of breath. If it is not approved, we may consider Daliresp.   INSTRUCTIONS: Follow up with cardiology next week as planned. Monitor for any side effects from the new medication, especially increased shortness of breath. Attend pulmonary rehab as referred.
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
## 2024-07-06 ENCOUNTER — Telehealth: Payer: Self-pay

## 2024-07-06 NOTE — Telephone Encounter (Signed)
 Received [employer]  new start paperwork. Completed form and faxed with clinicals and insurance card copy to [facility]   Phone#: [phone] Fax#: [phone]

## 2024-07-07 NOTE — Progress Notes (Unsigned)
 Cardiology Office Note    Date:  07/08/2024  ID:  Maximiano Lott, DOB 21-Sep-1940, MRN [id number] Cardiologist: Jayson Sierras, MD { :  History of Present Illness:    Benjamin [name] is a 83 y.o. male with past medical history of CAD (s/p CABG in 02/2019 with LIMA to LAD, SVG to D1, SVG to OM and SVG to RCA, NST in 2023 showing mild ischemia and medical management pursued), HTN, HLD and COPD who presents to the office today for follow-up from a recent Emergency Department visit.  He was last examined by Dr. Sierras in 03/2024 and denied any exertional chest pain but did report fatigue and dyspnea on exertion. He was continued on his current cardiac medications with Repatha , Lopressor  12.5 mg twice daily. Was no longer on ASA given prior GI bleeding.  In the interim, he was evaluated at [facility] ED on 06/03/2024 for evaluation of chest pain which started earlier in the day.  Reported having worsening dyspnea on exertion and was supposed to be using supplemental oxygen  but not wearing at the time arrival as he reported not liking to use it. Hs Troponin values were negative and EKG was without acute ST changes. CTA showed no evidence of a PE and no acute intrathoracic abnormalities. Presentation was felt to most consistent with a COPD exacerbation and he was prescribed antibiotics and a steroid taper at discharge.  In talking with the patient and his wife today, he reports his biggest issues over the past few months have been worsening balance issues and cramping along his legs. Reports having balance issues for many years but symptoms have acutely worsened over the past few months and have led to several falls. He does not check his vitals when these episodes occur. No dizziness or presyncope. He is supposed to be wearing supplemental oxygen  but says he does not wear this due to it being a hassle. Has baseline dyspnea on exertion. No specific orthopnea or PND. Occasional lower extremity edema and only takes  Lasix  as needed but has not utilized this in several weeks. He denies any chest pain or palpitations. In reviewing his fluid intake, he mostly consumes coffee and lime aid. Does not consume water  at this time.  Studies Reviewed:   EKG: EKG is not ordered today. EKG from 06/03/2024 is reviewed and demonstrates significant artifact but appears most consistent with NSR, HR 89 with incomplete RBBB.    NST: 06/2022   ECG demonstrates incomplete right bundle branch block.   No ST deviation was noted from baseline EKG. The ECG was not diagnostic due to pharmacologic protocol.   The patient reported dyspnea during the stress test. Normal blood pressure and normal heart rate response noted during stress. Heart rate recovery was normal.   LV perfusion is abnormal. There is no evidence of ischemia. There is no evidence of infarction. Defect 1: There is a medium defect with mild reduction in uptake present in the basal inferior and inferolateral location(s) that is fixed. Consistent with artifact caused by diaphragmatic attenuation.   Evidence of transient ischemic dilation (TID) noted. TID was appreciated visually and quantitatively.  This may indicate balanced ischemia due to multivessel CAD.   Left ventricular function is normal.   Consider coronary CTA if clinically indicated.   Echocardiogram: 08/2023 IMPRESSIONS     1. Left ventricular ejection fraction, by estimation, is 60 to 65%. The  left ventricle has normal function. The left ventricle has no regional  wall motion abnormalities. Left ventricular diastolic  parameters are  consistent with Grade I diastolic  dysfunction (impaired relaxation).   2. Right ventricular systolic function is normal. The right ventricular  size is normal. Tricuspid regurgitation signal is inadequate for assessing  PA pressure.   3. The mitral valve is grossly normal. Trivial mitral valve  regurgitation.   4. The aortic valve is tricuspid. Aortic valve  regurgitation is not  visualized.   5. Aortic dilatation noted. There is borderline dilatation of the aortic  root, measuring 39 mm.   6. The inferior vena cava is normal in size with greater than 50%  respiratory variability, suggesting right atrial pressure of 3 mmHg.   Comparison(s): No significant change from prior study. Prior images  reviewed side by side.    Physical Exam:   VS:  BP 110/76 (BP Location: Right Arm, Cuff Size: Normal)   Pulse 74   Ht 5' 11 (1.803 m)   Wt 186 lb (84.4 kg)   SpO2 93%   BMI 25.94 kg/m    Wt Readings from Last 3 Encounters:  07/08/24 186 lb (84.4 kg)  07/02/24 189 lb 9.6 oz (86 kg)  06/03/24 184 lb (83.5 kg)     GEN: Pleasant, elderly male appearing in no acute distress NECK: No JVD; No carotid bruits CARDIAC: RRR, no murmurs, rubs, gallops RESPIRATORY:  Clear to auscultation without rales, wheezing or rhonchi  ABDOMEN: Appears non-distended. No obvious abdominal masses. EXTREMITIES: No clubbing or cyanosis. No pitting edema.  Distal pedal pulses are 2+ bilaterally.   Assessment and Plan:   1. Coronary artery disease involving native heart without angina pectoris, unspecified vessel or lesion type - He previously underwent CABG in 02/2019 with details as outlined above and most recent ischemic evaluation was an NST in 06/2022 which showed mild ischemia and medical management was pursued at that time.  - He was previously experiencing chest pain at the time of ED evaluation last month but he feels like symptoms were due to COPD as he experienced improvement with antibiotics and steroids and denies any recurrent episodes of chest pain since. - No indication for further testing at this time. Continue current medical therapy with Repatha  and PRN SL NTG. Has not been on ASA due to prior GIB. Holding Lopressor  temporarily as discussed below.   2. Hyperlipidemia LDL goal <70 - FLP in 05/2024 showed total cholesterol 99, triglycerides 126, HDL 49  LDL 28. He has been intolerant to multiple statins and remains on Repatha .  3. HTN - HTN listed in past medical history but he reports BP has always been well-controlled. BP is at 110/76 during today's visit. As discussed above, he does report having balance issues and while I suspect this is due to dehydration, we reviewed that he could hold Lopressor  for 2 weeks to see if symptoms improve. If no change in symptoms, I encouraged him to resume Lopressor  12.5 mg twice daily.  4. Difficulty with Balance - Orthostatics were checked today during clinic and negative. Will have him take a drug holiday from Lopressor  for 2 weeks to see if balance improves. If no change, was encouraged to resume. -  I suspect his balance issues are due to dehydration as he does not consume water  and was encouraged to increase water  consumption. He also does not use his supplemental oxygen  and hypoxia could be contributing as well. Compliance with O2 supplementation was encouraged.    Signed, Laymon CHRISTELLA Qua, PA-C

## 2024-07-08 ENCOUNTER — Encounter (HOSPITAL_COMMUNITY)

## 2024-07-08 ENCOUNTER — Encounter: Payer: Self-pay | Admitting: Student

## 2024-07-08 ENCOUNTER — Ambulatory Visit: Attending: Student | Admitting: Student

## 2024-07-08 VITALS — BP 110/76 | HR 74 | Ht 71.0 in | Wt 186.0 lb

## 2024-07-08 DIAGNOSIS — R2689 Other abnormalities of gait and mobility: Secondary | ICD-10-CM | POA: Diagnosis not present

## 2024-07-08 DIAGNOSIS — I10 Essential (primary) hypertension: Secondary | ICD-10-CM | POA: Diagnosis not present

## 2024-07-08 DIAGNOSIS — E785 Hyperlipidemia, unspecified: Secondary | ICD-10-CM

## 2024-07-08 DIAGNOSIS — I2510 Atherosclerotic heart disease of native coronary artery without angina pectoris: Secondary | ICD-10-CM

## 2024-07-08 NOTE — Patient Instructions (Addendum)
 Hold Metoprolol  (Lopressor ) for 2 weeks to see if balance improves. If no change in symptoms, can resume.   Increase fluid intake to 60 ounces a day with a majority of that being water !!  Medication Instructions:  Your physician recommends that you continue on your current medications as directed. Please refer to the Current Medication list given to you today.  Hold Metoprolol  ( Lopressor  ) for 2 Weeks to see if balance improves.   *If you need a refill on your cardiac medications before your next appointment, please call your pharmacy*  Lab Work: NONE   If you have labs (blood work) drawn today and your tests are completely normal, you will receive your results only by: MyChart Message (if you have MyChart) OR A paper copy in the mail If you have any lab test that is abnormal or we need to change your treatment, we will call you to review the results.  Testing/Procedures: NONE   Follow-Up: At [facility], you and your health needs are our priority.  As part of our continuing mission to provide you with exceptional heart care, our providers are all part of one team.  This team includes your primary Cardiologist (physician) and Advanced Practice Providers or APPs (Physician Assistants and Nurse Practitioners) who all work together to provide you with the care you need, when you need it.  Your next appointment:   3 month(s)  Provider:   Jayson Sierras, MD    We recommend signing up for the patient portal called MyChart.  Sign up information is provided on this After Visit Summary.  MyChart is used to connect with patients for Virtual Visits (Telemedicine).  Patients are able to view lab/test results, encounter notes, upcoming appointments, etc.  Non-urgent messages can be sent to your provider as well.   To learn more about what you can do with MyChart, go to [email or web address].   Other Instructions Thank you for choosing [facility]!

## 2024-07-08 NOTE — Telephone Encounter (Signed)
 Received fax from [employer] stating they could not verify the provider's NPI. Per NPI registry Dr. Lavena NPI is [id number]. Corrected form and faxed back to [employer].

## 2024-07-09 ENCOUNTER — Encounter (HOSPITAL_COMMUNITY)
Admission: RE | Admit: 2024-07-09 | Discharge: 2024-07-09 | Disposition: A | Discharge status: Home / Self Care | Source: Ambulatory Visit

## 2024-07-09 DIAGNOSIS — J449 Chronic obstructive pulmonary disease, unspecified: Secondary | ICD-10-CM | POA: Insufficient documentation

## 2024-07-09 NOTE — Telephone Encounter (Signed)
 Received fax from [employer] confirming receipt of enrollment form.   Patient ID: [id number]

## 2024-07-09 NOTE — Progress Notes (Signed)
 Virtual orientation visit completed for pulmonary rehab with COPD. On-site orientation visit scheduled for 07/12/24 at 2:15.

## 2024-07-12 ENCOUNTER — Encounter (HOSPITAL_COMMUNITY)
Admission: RE | Admit: 2024-07-12 | Discharge: 2024-07-12 | Disposition: A | Discharge status: Home / Self Care | Source: Ambulatory Visit

## 2024-07-12 VITALS — Ht 70.0 in | Wt 187.6 lb

## 2024-07-12 DIAGNOSIS — J449 Chronic obstructive pulmonary disease, unspecified: Secondary | ICD-10-CM

## 2024-07-12 NOTE — Telephone Encounter (Signed)
 Received fax from [employer] with summary of benefits. Referral form for [place]  received. Rx will be triaged to [facility].. Once benefits investigation completed, pharmacy will reach out the patient to schedule shipment. If medication is unaffordable, patient will need to express financial hardship to be referred back to [facility] for patient assistance program pre-screening.   Patient ID: [id number] Pharmacy phone: [phone] [facility] Phone#: [phone]

## 2024-07-12 NOTE — Progress Notes (Addendum)
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 the diseases.   Stress and Energy Conservation Group instruction provided by PowerPoint, verbal discussion, and written material to support subject matter. Instructor gives an overview of stress and the impact it can have on the body. Instructor also reviews ways to reduce stress. There is also a discussion on energy conservation and ways to conserve energy throughout the day.   Warning Signs and Symptoms Group instruction provided by PowerPoint, verbal discussion, and written material to support subject matter. Instructor reviews warning signs and symptoms of stroke, heart attack, cold and flu. Instructor also reviews ways to prevent the spread of infection.   Other Education Group or individual verbal, written, or video instructions that support the educational goals of the pulmonary  rehab program.    Knowledge Questionnaire Score:  Knowledge Questionnaire Score - 07/12/24 1452       Knowledge Questionnaire Score   Pre Score 9/18          Core Components/Risk Factors/Patient Goals at Admission:  Personal Goals and Risk Factors at Admission - 07/09/24  1510       Core Components/Risk Factors/Patient Goals on Admission    Weight Management Weight Maintenance    Improve shortness of breath with ADL's Yes    Intervention Provide education, individualized exercise plan and daily activity instruction to help decrease symptoms of SOB with activities of daily living.    Expected Outcomes Short Term: Improve cardiorespiratory fitness to achieve a reduction of symptoms when performing ADLs;Long Term: Be able to perform more ADLs without symptoms or delay the onset of symptoms    Increase knowledge of respiratory medications and ability to use respiratory devices properly  Yes    Intervention Provide education and demonstration as needed of appropriate use of medications, inhalers, and oxygen  therapy.    Expected Outcomes Short Term: Achieves understanding of medications use. Understands that oxygen  is a medication prescribed by physician. Demonstrates appropriate use of inhaler and oxygen  therapy.;Long Term: Maintain appropriate use of medications, inhalers, and oxygen  therapy.    Heart Failure Yes    Intervention Provide a combined exercise and nutrition program that is supplemented with education, support and counseling about heart failure. Directed toward relieving symptoms such as shortness of breath, decreased exercise tolerance, and extremity edema.    Expected Outcomes Improve functional capacity of life;Short term: Attendance in program 2-3 days a week with increased exercise capacity. Reported lower sodium intake. Reported increased fruit and vegetable intake. Reports medication compliance.;Short term: Daily weights obtained and reported for increase. Utilizing diuretic  protocols set by physician.;Long term: Adoption of self-care skills and reduction of barriers for early signs and symptoms recognition and intervention leading to self-care maintenance.    Hypertension Yes    Intervention Provide education on lifestyle modifcations including regular physical activity/exercise, weight management, moderate sodium restriction and increased consumption of fresh fruit, vegetables, and low fat dairy, alcohol moderation, and smoking cessation.;Monitor prescription use compliance.    Expected Outcomes Short Term: Continued assessment and intervention until BP is < 140/45mm HG in hypertensive participants. < 130/67mm HG in hypertensive participants with diabetes, heart failure or chronic kidney disease.;Long Term: Maintenance of blood pressure at goal levels.    Lipids Yes    Intervention Provide education and support for participant on nutrition & aerobic/resistive exercise along with prescribed medications to achieve LDL 70mg , HDL >40mg .    Expected Outcomes Short Term: Participant states understanding of desired cholesterol values and is compliant with medications prescribed. Participant is following exercise prescription and nutrition guidelines.;Long Term: Cholesterol controlled with medications as prescribed, with individualized exercise RX and with personalized nutrition plan. Value goals: LDL < 70mg , HDL > 40 mg.          Core Components/Risk Factors/Patient Goals Review:    Core Components/Risk Factors/Patient Goals at Discharge (Final Review):    ITP Comments:  ITP Comments     Row Name 07/09/24 1514 07/12/24 1456         ITP Comments Virtual orientation visit completed for pulmonary rehab with COPD. On-site orientation visit scheduled for 07/12/24 at 2:15. Patient arrived for 1st visit/orientation/education at 1400. Patient was referred to PR by Dr. Sammi Fredericks due to COPD. During orientation advised patient on arrival and appointment times what to wear,  what to do before, during and after exercise. Reviewed attendance and class policy.  Pt is scheduled to return Pulmonary Rehab on 07/19/24 at 1430. Pt was advised to come to class 15 minutes before class starts.  Discussed RPE/Dpysnea scales. Patient participated in warm up stretches. Patient was able to complete 6 minute  walk test. Patient was measured for the equipment. Discussed equipment safety with patient. Took patient pre-anthropometric measurements. Patient finished visit at 1310.         Comments: Patient arrived for 1st visit/orientation/education at 1400. Patient was referred to PR by Dr. Sammi Fredericks due to COPD. During orientation advised patient on arrival and appointment times what to wear, what to do before, during and after exercise. Reviewed attendance and class policy.  Pt is scheduled to return Pulmonary Rehab on 07/19/24 at 1430. Pt was advised to come to class 15 minutes before class starts.  Discussed RPE/Dpysnea scales. Patient participated in warm up stretches. Patient was able to complete 6 minute walk test. Patient was measured for the equipment. Discussed equipment safety with patient. Took patient pre-anthropometric measurements. Patient finished visit at 1310.

## 2024-07-12 NOTE — Patient Instructions (Addendum)
 Patient Instructions  Patient Details  Name: Benjamin [name] MRN: [id number] Date of Birth: [birth year] Referring Provider:  Theodoro Lakes, MD  Below are your personal goals for exercise, nutrition, and risk factors. Our goal is to help you stay on track towards obtaining and maintaining these goals. We will be discussing your progress on these goals with you throughout the program.  Initial Exercise Prescription:  Initial Exercise Prescription - 07/12/24 1500       Date of Initial Exercise RX and Referring Provider   Date 07/12/24      Treadmill   MPH 1.2    Grade 0    Minutes 15    METs 1.92      NuStep   Level 1    SPM 50    Minutes 15    METs 1.8      Prescription Details   Frequency (times per week) 3    Duration Progress to 30 minutes of continuous aerobic without signs/symptoms of physical distress      Intensity   THRR 40-80% of Max Heartrate 110-129    Ratings of Perceived Exertion 11-13    Perceived Dyspnea 0-4      Resistance Training   Training Prescription Yes    Weight 3    Reps 10-15          Exercise Goals: Frequency: Be able to perform aerobic exercise two to three times per week in program working toward 2-5 days per week of home exercise.  Intensity: Work with a perceived exertion of 11 (fairly light) - 15 (hard) while following your exercise prescription.  We will make changes to your prescription with you as you progress through the program.   Duration: Be able to do 30 to 45 minutes of continuous aerobic exercise in addition to a 5 minute warm-up and a 5 minute cool-down routine.   Nutrition Goals: Your personal nutrition goals will be established when you do your nutrition analysis with the dietician.  The following are general nutrition guidelines to follow: Cholesterol < 200mg /day Sodium < 1500mg /day Fiber: Men over 50 yrs - 30 grams per day  Personal Goals:  Personal Goals and Risk Factors at Admission - 07/09/24 1510       Core  Components/Risk Factors/Patient Goals on Admission    Weight Management Weight Maintenance    Improve shortness of breath with ADL's Yes    Intervention Provide education, individualized exercise plan and daily activity instruction to help decrease symptoms of SOB with activities of daily living.    Expected Outcomes Short Term: Improve cardiorespiratory fitness to achieve a reduction of symptoms when performing ADLs;Long Term: Be able to perform more ADLs without symptoms or delay the onset of symptoms    Increase knowledge of respiratory medications and ability to use respiratory devices properly  Yes    Intervention Provide education and demonstration as needed of appropriate use of medications, inhalers, and oxygen  therapy.    Expected Outcomes Short Term: Achieves understanding of medications use. Understands that oxygen  is a medication prescribed by physician. Demonstrates appropriate use of inhaler and oxygen  therapy.;Long Term: Maintain appropriate use of medications, inhalers, and oxygen  therapy.    Heart Failure Yes    Intervention Provide a combined exercise and nutrition program that is supplemented with education, support and counseling about heart failure. Directed toward relieving symptoms such as shortness of breath, decreased exercise tolerance, and extremity edema.    Expected Outcomes Improve functional capacity of life;Short term: Attendance in program 2-3 days  a week with increased exercise capacity. Reported lower sodium intake. Reported increased fruit and vegetable intake. Reports medication compliance.;Short term: Daily weights obtained and reported for increase. Utilizing diuretic protocols set by physician.;Long term: Adoption of self-care skills and reduction of barriers for early signs and symptoms recognition and intervention leading to self-care maintenance.    Hypertension Yes    Intervention Provide education on lifestyle modifcations including regular physical  activity/exercise, weight management, moderate sodium restriction and increased consumption of fresh fruit, vegetables, and low fat dairy, alcohol moderation, and smoking cessation.;Monitor prescription use compliance.    Expected Outcomes Short Term: Continued assessment and intervention until BP is < 140/52mm HG in hypertensive participants. < 130/70mm HG in hypertensive participants with diabetes, heart failure or chronic kidney disease.;Long Term: Maintenance of blood pressure at goal levels.    Lipids Yes    Intervention Provide education and support for participant on nutrition & aerobic/resistive exercise along with prescribed medications to achieve LDL 70mg , HDL >40mg .    Expected Outcomes Short Term: Participant states understanding of desired cholesterol values and is compliant with medications prescribed. Participant is following exercise prescription and nutrition guidelines.;Long Term: Cholesterol controlled with medications as prescribed, with individualized exercise RX and with personalized nutrition plan. Value goals: LDL < 70mg , HDL > 40 mg.          Tobacco Use Initial Evaluation: Social History   Tobacco Use  Smoking Status Former   Current packs/day: 0.00   Average packs/day: 0.5 packs/day for 50.0 years (25.0 ttl pk-yrs)   Types: Cigarettes   Start date: 7   Quit date: 2005   Years since quitting: 20.8   Passive exposure: Never  Smokeless Tobacco Never    Exercise Goals and Review:  Exercise Goals     Row Name 07/12/24 1516             Exercise Goals   Increase Physical Activity Yes       Intervention Provide advice, education, support and counseling about physical activity/exercise needs.;Develop an individualized exercise prescription for aerobic and resistive training based on initial evaluation findings, risk stratification, comorbidities and participant's personal goals.       Expected Outcomes Long Term: Exercising regularly at least 3-5 days a  week.;Short Term: Attend rehab on a regular basis to increase amount of physical activity.;Long Term: Add in home exercise to make exercise part of routine and to increase amount of physical activity.       Increase Strength and Stamina Yes       Intervention Provide advice, education, support and counseling about physical activity/exercise needs.;Develop an individualized exercise prescription for aerobic and resistive training based on initial evaluation findings, risk stratification, comorbidities and participant's personal goals.       Expected Outcomes Short Term: Increase workloads from initial exercise prescription for resistance, speed, and METs.;Short Term: Perform resistance training exercises routinely during rehab and add in resistance training at home;Long Term: Improve cardiorespiratory fitness, muscular endurance and strength as measured by increased METs and functional capacity ( )       Able to understand and use rate of perceived exertion (RPE) scale Yes       Intervention Provide education and explanation on how to use RPE scale       Expected Outcomes Short Term: Able to use RPE daily in rehab to express subjective intensity level;Long Term:  Able to use RPE to guide intensity level when exercising independently       Able to understand and use  Dyspnea scale Yes       Intervention Provide education and explanation on how to use Dyspnea scale       Expected Outcomes Short Term: Able to use Dyspnea scale daily in rehab to express subjective sense of shortness of breath during exertion;Long Term: Able to use Dyspnea scale to guide intensity level when exercising independently       Knowledge and understanding of Target Heart Rate Range (THRR) Yes       Intervention Provide education and explanation of THRR including how the numbers were predicted and where they are located for reference       Expected Outcomes Short Term: Able to use daily as guideline for intensity in rehab;Long  Term: Able to use THRR to govern intensity when exercising independently;Short Term: Able to state/look up THRR       Able to check pulse independently Yes       Intervention Provide education and demonstration on how to check pulse in carotid and radial arteries.;Review the importance of being able to check your own pulse for safety during independent exercise       Expected Outcomes Short Term: Able to explain why pulse checking is important during independent exercise;Long Term: Able to check pulse independently and accurately       Understanding of Exercise Prescription Yes       Intervention Provide education, explanation, and written materials on patient's individual exercise prescription       Expected Outcomes Short Term: Able to explain program exercise prescription;Long Term: Able to explain home exercise prescription to exercise independently          Copy of goals given to participant.

## 2024-07-13 NOTE — Telephone Encounter (Signed)
 Received fax from [facility] confirming receipt of rx.

## 2024-07-14 DIAGNOSIS — J4489 Other specified chronic obstructive pulmonary disease: Secondary | ICD-10-CM | POA: Diagnosis not present

## 2024-07-19 ENCOUNTER — Encounter (HOSPITAL_COMMUNITY)
Admission: RE | Admit: 2024-07-19 | Discharge: 2024-07-19 | Disposition: A | Discharge status: Home / Self Care | Source: Ambulatory Visit

## 2024-07-19 DIAGNOSIS — J449 Chronic obstructive pulmonary disease, unspecified: Secondary | ICD-10-CM | POA: Diagnosis not present

## 2024-07-19 NOTE — Progress Notes (Signed)
 Daily Session Note  Patient Details  Name: Benjamin [name] MRN: [id number] Date of Birth: 09/04/1941 Referring Provider:   Flowsheet Row CARDIAC REHAB PHASE II ORIENTATION from 08/11/2019 in [facility] CARDIAC REH[facility]IT[facility]  Referring Provider Charls    Encounter Date: 07/19/2024  Check In:  Session Check In - 07/19/24 1500       Check-In   Supervising physician immediately available to respond to emergencies See telemetry face sheet for immediately available MD    Location [facility]    Staff Present Powell Benders, BS, Exercise Physiologist;Brittany Jackquline, BSN, RN, WTA-C;Victoria Zina, RN;Debra Vicci, RN, BSN    Virtual Visit No    Medication changes reported     No    Fall or balance concerns reported    No    Tobacco Cessation No Change    Warm-up and Cool-down Performed on first and last piece of equipment    Resistance Training Performed Yes    VAD Patient? No    PAD/SET Patient? No      Pain Assessment   Currently in Pain? No/denies    Pain Score 0-No pain    Multiple Pain Sites No          Capillary Blood Glucose: No results found for this or any previous visit (from the past 24 hours).    Social History   Tobacco Use  Smoking Status Former   Current packs/day: 0.00   Average packs/day: 0.5 packs/day for 50.0 years (25.0 ttl pk-yrs)   Types: Cigarettes   Start date: 16   Quit date: 2005   Years since quitting: 20.8   Passive exposure: Never  Smokeless Tobacco Never    Goals Met:  Independence with exercise equipment Using PLB without cueing & demonstrates good technique Exercise tolerated well No report of concerns or symptoms today Strength training completed today  Goals Unmet:  Not Applicable  Comments: First full day of exercise!  Patient was oriented to gym and equipment including functions, settings, policies, and procedures.  Patient's individual exercise prescription and treatment plan were reviewed.  All starting  workloads were established based on the results of the 6 minute walk test done at initial orientation visit.  The plan for exercise progression was also introduced and progression will be customized based on patient's performance and goals.

## 2024-07-20 NOTE — Telephone Encounter (Signed)
 Copied from CRM [id number]. Topic: Clinical - Prescription Issue >> Jul 20, 2024 10:47 AM Benton KIDD wrote: Reason for RMF:ejupzwu wife valentin is calling with patient in the back  dr pawar prescribed a brand new medication . for the first dose we want to be able to come in for either the doctor or pa to show him exactly how to use it  Please give them a call back concerning this information  [phone] Going to schedule a appointment for this . Patient wife wants to come in with patient Tried to schedule a appointment but nothing was available till November 20th please reach put to them to see how to go about getting help with doing the medication   Pharmacy can you help the pt with Ohtuvayre . Thank you!

## 2024-07-20 NOTE — Telephone Encounter (Signed)
 Patient scheduled with pharmacy team on Monday, 11/10 at 10:30 AM. Spoke to patient's wife, Clara. She plans to bring Ohtuvayre  and all supplies with them to appointment.

## 2024-07-21 ENCOUNTER — Encounter (HOSPITAL_COMMUNITY)
Admission: RE | Admit: 2024-07-21 | Discharge: 2024-07-21 | Disposition: A | Discharge status: Home / Self Care | Source: Ambulatory Visit

## 2024-07-21 ENCOUNTER — Encounter (HOSPITAL_COMMUNITY): Payer: Self-pay

## 2024-07-21 DIAGNOSIS — J449 Chronic obstructive pulmonary disease, unspecified: Secondary | ICD-10-CM | POA: Diagnosis not present

## 2024-07-21 LAB — GLUCOSE, CAPILLARY: Glucose-Capillary: 98 mg/dL (ref 70–99)

## 2024-07-21 NOTE — Progress Notes (Signed)
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 exercise, core components of exercise, frequency, duration, and intensity of an exercise routine, importance of utilizing pulse oximetry during exercise, safety while exercising, and options of places to exercise outside of rehab.    MET Level  Group instruction provided by PowerPoint, verbal discussion, and written material to support subject matter. Instructor reviews what METs are and how to increase METs.    Pulmonary Medications Verbally interactive group education provided by instructor with focus on inhaled medications and proper administration.   Anatomy and Physiology of the Respiratory System  Group instruction provided by PowerPoint, verbal discussion, and written material to support subject matter. Instructor reviews respiratory cycle and  anatomical components of the respiratory system and their functions. Instructor also reviews differences in obstructive and restrictive respiratory diseases with examples of each.    Oxygen  Safety Group instruction provided by PowerPoint, verbal discussion, and written material to support subject matter. There is an overview of "What is Oxygen " and "Why do we need it".  Instructor also reviews how to create a safe environment for oxygen  use, the importance of using oxygen  as prescribed, and the risks of noncompliance. There is a brief discussion on traveling with oxygen  and resources the patient may utilize.   Oxygen  Use Group instruction provided by PowerPoint, verbal discussion, and written material to discuss how supplemental oxygen  is prescribed and different types of oxygen  supply systems. Resources for more information are provided.    Breathing Techniques Group instruction that is supported by demonstration and informational handouts. Instructor discusses the benefits of pursed lip and diaphragmatic breathing and detailed demonstration on how to perform both.     Risk Factor Reduction Group instruction that is supported by a PowerPoint presentation. Instructor discusses the definition of a risk factor, different risk factors for pulmonary disease, and how the heart and lungs work together.   Pulmonary Diseases Group instruction provided by PowerPoint, verbal discussion, and written material to support subject matter. Instructor gives an overview of the different type of pulmonary diseases. There is also a discussion on risk factors and symptoms as well as ways to manage the diseases.   Stress and Energy Conservation Group instruction provided by PowerPoint, verbal discussion, and written material to support subject matter. Instructor gives an overview of stress and the impact it can have on the body. Instructor also reviews ways to reduce stress. There is also a discussion on energy  conservation and ways to conserve energy throughout the day.   Warning Signs and Symptoms Group instruction provided by PowerPoint, verbal discussion, and written material to support subject matter. Instructor reviews warning signs and symptoms of stroke, heart attack, cold and flu. Instructor also reviews ways to prevent the spread of infection.   Other Education Group or individual verbal, written, or video instructions that support the educational goals of the pulmonary rehab program.    Knowledge Questionnaire Score:  Knowledge Questionnaire Score - 07/12/24 1452       Knowledge Questionnaire Score   Pre Score 9/18          Core Components/Risk Factors/Patient Goals at Admission:  Personal Goals and Risk Factors at Admission - 07/09/24 1510       Core Components/Risk Factors/Patient Goals on Admission    Weight Management Weight Maintenance    Improve shortness of breath with ADL's Yes    Intervention Provide education, individualized exercise plan and daily activity instruction to help decrease symptoms of SOB with activities of daily living.    Expected Outcomes Short Term: Improve cardiorespiratory fitness to achieve a reduction of symptoms when performing ADLs;Long Term: Be able to perform more ADLs without symptoms or delay the onset of symptoms    Increase knowledge of respiratory medications and ability to use respiratory devices properly  Yes    Intervention Provide education and demonstration as needed of appropriate use of medications, inhalers, and oxygen  therapy.    Expected Outcomes Short Term: Achieves understanding of medications use. Understands that oxygen  is a medication prescribed by physician. Demonstrates appropriate use of inhaler and oxygen  therapy.;Long Term: Maintain appropriate  use of medications, inhalers, and oxygen  therapy.    Heart Failure Yes    Intervention Provide a combined exercise and nutrition program that is supplemented with education,  support and counseling about heart failure. Directed toward relieving symptoms such as shortness of breath, decreased exercise tolerance, and extremity edema.    Expected Outcomes Improve functional capacity of life;Short term: Attendance in program 2-3 days a week with increased exercise capacity. Reported lower sodium intake. Reported increased fruit and vegetable intake. Reports medication compliance.;Short term: Daily weights obtained and reported for increase. Utilizing diuretic protocols set by physician.;Long term: Adoption of self-care skills and reduction of barriers for early signs and symptoms recognition and intervention leading to self-care maintenance.    Hypertension Yes    Intervention Provide education on lifestyle modifcations including regular physical activity/exercise, weight management, moderate sodium restriction and increased consumption of fresh fruit, vegetables, and low fat dairy, alcohol moderation, and smoking cessation.;Monitor prescription use compliance.    Expected Outcomes Short Term: Continued assessment and intervention until BP is < 140/51mm HG in hypertensive participants. < 130/39mm HG in hypertensive participants with diabetes, heart failure or chronic kidney disease.;Long Term: Maintenance of blood pressure at goal levels.    Lipids Yes    Intervention Provide education and support for participant on nutrition & aerobic/resistive exercise along with prescribed medications to achieve LDL 70mg , HDL >40mg .    Expected Outcomes Short Term: Participant states understanding of desired cholesterol values and is compliant with medications prescribed. Participant is following exercise prescription and nutrition guidelines.;Long Term: Cholesterol controlled with medications as prescribed, with individualized exercise RX and with personalized nutrition plan. Value goals: LDL < 70mg , HDL > 40 mg.          Core Components/Risk Factors/Patient Goals Review:    Core  Components/Risk Factors/Patient Goals at Discharge (Final Review):    ITP Comments:  ITP Comments     Row Name 07/09/24 1514 07/12/24 1456 07/19/24 1531 07/21/24 0831     ITP Comments Virtual orientation visit completed for pulmonary rehab with COPD. On-site orientation visit scheduled for 07/12/24 at 2:15. Patient arrived for 1st visit/orientation/education at 1400. Patient was referred to PR by Dr. Sammi Fredericks due to COPD. During orientation advised patient on arrival and appointment times what to wear, what to do before, during and after exercise. Reviewed attendance and class policy.  Pt is scheduled to return Pulmonary Rehab on 07/19/24 at 1430. Pt was advised to come to class 15 minutes before class starts.  Discussed RPE/Dpysnea scales. Patient participated in warm up stretches. Patient was able to complete 6 minute walk test. Patient was measured for the equipment. Discussed equipment safety with patient. Took patient pre-anthropometric measurements. Patient finished visit at 1310. First full day of exercise!  Patient was oriented to gym and equipment including functions, settings, policies, and procedures.  Patient's individual exercise prescription and treatment plan were reviewed.  All starting workloads were established based on the results of the 6 minute walk test done at initial orientation visit.  The plan for exercise progression was also introduced and progression will be customized based on patient's performance and goal 30 day review completed. ITP sent to Dr.Jehanzeb Memon, Medical Director of  Pulmonary Rehab. Continue with ITP unless changes are made by physician.  New to the program.       Comments: 30 day review

## 2024-07-21 NOTE — Progress Notes (Signed)
 Daily Session Note  Patient Details  Name: Benjamin [name] MRN: [id number] Date of Birth: 02-Jun-1941 Referring Provider:   Flowsheet Row CARDIAC REHAB PHASE II ORIENTATION from 08/11/2019 in [facility] CARDIAC REHABILITATION  Referring Provider Charls    Encounter Date: 07/21/2024  Check In:  Session Check In - 07/21/24 1419       Check-In   Supervising physician immediately available to respond to emergencies See telemetry face sheet for immediately available MD    Location [facility]    Staff Present Laymon Rattler, BSN, RN, Oddis Louder, RN, BSN;Marian Meneely BSN, RN    Virtual Visit No    Medication changes reported     No    Fall or balance concerns reported    No    Tobacco Cessation No Change    Warm-up and Cool-down Performed on first and last piece of equipment    Resistance Training Performed Yes    VAD Patient? No    PAD/SET Patient? No      Pain Assessment   Currently in Pain? No/denies    Pain Score 0-No pain    Multiple Pain Sites No          Capillary Blood Glucose: No results found for this or any previous visit (from the past 24 hours).    Social History   Tobacco Use  Smoking Status Former   Current packs/day: 0.00   Average packs/day: 0.5 packs/day for 50.0 years (25.0 ttl pk-yrs)   Types: Cigarettes   Start date: 72   Quit date: 2005   Years since quitting: 20.8   Passive exposure: Never  Smokeless Tobacco Never    Goals Met:  Proper associated with RPD/PD & O2 Sat Independence with exercise equipment Using PLB without cueing & demonstrates good technique Exercise tolerated well Queuing for purse lip breathing No report of concerns or symptoms today Strength training completed today  Goals Unmet:  Not Applicable  Comments: SABRASABRAPt able to follow exercise prescription today without complaint.  Will continue to monitor for progression.

## 2024-07-23 ENCOUNTER — Encounter (HOSPITAL_COMMUNITY)
Admission: RE | Admit: 2024-07-23 | Discharge: 2024-07-23 | Disposition: A | Discharge status: Home / Self Care | Source: Ambulatory Visit

## 2024-07-23 DIAGNOSIS — J449 Chronic obstructive pulmonary disease, unspecified: Secondary | ICD-10-CM

## 2024-07-23 NOTE — Progress Notes (Signed)
 Daily Session Note  Patient Details  Name: Benjamin [name] MRN: [id number] Date of Birth: 02/14/1941 Referring Provider:   Flowsheet Row CARDIAC REHAB PHASE II ORIENTATION from 08/11/2019 in [facility] CARDIAC REHABILITATION  Referring Provider Charls    Encounter Date: 07/23/2024  Check In:  Session Check In - 07/23/24 1300       Check-In   Supervising physician immediately available to respond to emergencies See telemetry face sheet for immediately available ER MD    Location [facility]    Staff Present Richerd Buddle, RN;Amauria Younts Vicci, RN, BSN;Heather Con, BS, Exercise Physiologist    Virtual Visit No    Medication changes reported     No    Fall or balance concerns reported    No    Warm-up and Cool-down Performed as group-led instruction    Resistance Training Performed Yes    VAD Patient? No    PAD/SET Patient? No      Pain Assessment   Currently in Pain? No/denies    Pain Score 0-No pain    Multiple Pain Sites No          Capillary Blood Glucose: No results found for this or any previous visit (from the past 24 hours).    Social History   Tobacco Use  Smoking Status Former   Current packs/day: 0.00   Average packs/day: 0.5 packs/day for 50.0 years (25.0 ttl pk-yrs)   Types: Cigarettes   Start date: 8   Quit date: 2005   Years since quitting: 20.8   Passive exposure: Never  Smokeless Tobacco Never    Goals Met:  Proper associated with RPD/PD & O2 Sat Independence with exercise equipment Using PLB without cueing & demonstrates good technique Exercise tolerated well No report of concerns or symptoms today Strength training completed today  Goals Unmet:  Not Applicable  Comments: Pt able to follow exercise prescription today without complaint.  Will continue to monitor for progression.

## 2024-07-26 ENCOUNTER — Ambulatory Visit

## 2024-07-26 ENCOUNTER — Encounter (HOSPITAL_COMMUNITY): Admission: RE | Admit: 2024-07-26 | Discharge: 2024-07-26 | Disposition: A | Source: Ambulatory Visit

## 2024-07-26 DIAGNOSIS — J449 Chronic obstructive pulmonary disease, unspecified: Secondary | ICD-10-CM

## 2024-07-26 DIAGNOSIS — J4489 Other specified chronic obstructive pulmonary disease: Secondary | ICD-10-CM

## 2024-07-26 DIAGNOSIS — Z5181 Encounter for therapeutic drug level monitoring: Secondary | ICD-10-CM

## 2024-07-26 NOTE — Progress Notes (Signed)
 HPI Patient presents today, accompanied by spouse, to [facility] to see pharmacy team for Ohtuvayre  education at patient request. Spouse requested visit with pharmacy team to administer first dose of Ohtuvayre  in office. Past medical history includes COPD/asthma overlap, allergic rhinitis, chronic diastolic CHF, CAD s/p CABG, GERD, COVID in 2022. Last COPD exacerbation was in Sept 2025 (ED visit, treated with steroids and azithromycin ).   Last seen by Dr. Theodoro on 07/02/24, at which time plan was to initiate Ohtuvayre . He was also referred to pulmonary rehab.   Prior fill history shows patient has used nebulized medications in the past: albuterol  (2022) and levalbuterol  2024).  He brings [employer] Pro compressor and associated supplies with Ohtuvayre  3mg /2.70mL solution for administration today.   Respiratory Medications Current regimen:   - Trelegy 200-62.5-25 mcg/act (1 puffs once daily)  Patient reports no known adherence challenges  OBJECTIVE Allergies  Allergen Reactions   Arsenic Swelling    Severe swelling if patient comes in contact    Contrast Media [Iodinated Contrast Media] Swelling and Rash   Nitrofurantoin  Monohyd Macro Itching    Completed the full course while taking Benadryl  for    Statins Rash    Joint pain    Outpatient Encounter Medications as of 07/26/2024  Medication Sig Note   acetaminophen  (TYLENOL ) 500 MG tablet Take 500 mg by mouth every 8 (eight) hours as needed for mild pain (pain score 1-3).    azithromycin  (ZITHROMAX ) 250 MG tablet Take 1 tablet (250 mg total) by mouth daily. Take first 2 tablets together, then 1 every day until finished.    COLESTID  1 g tablet Take 2 g by mouth 2 (two) times daily.    Cranberry 400 MG TABS Take 2 tablets by mouth 2 (two) times daily.    Evolocumab  (REPATHA  SURECLICK) 140 MG/ML SOAJ INJECT 140MG  SUBCUTANEOUSLY EVERY 14 DAYS AS DIRECTED BY MD    finasteride  (PROSCAR ) 5 MG tablet Take 5 mg by mouth daily.     Fluticasone -Umeclidin-Vilant (TRELEGY ELLIPTA ) 200-62.5-25 MCG/ACT AEPB USE 1 INHALATION ORALLY IN THE MORNING    furosemide  (LASIX ) 20 MG tablet Take 1 tablet (20 mg total) by mouth daily as needed for fluid or edema (for swelling).    ipratropium (ATROVENT ) 0.06 % nasal spray Place 2 sprays into both nostrils 3 (three) times daily. (Patient not taking: Reported on 07/09/2024)    levalbuterol  (XOPENEX  HFA) 45 MCG/ACT inhaler Inhale 1-2 puffs into the lungs every 4 (four) hours as needed for wheezing.    metoprolol  tartrate (LOPRESSOR ) 25 MG tablet Take 1/2 (one-half) tablet by mouth twice daily (Patient taking differently: Take 12.5 mg by mouth 2 (two) times daily.)    nitroGLYCERIN  (NITROSTAT ) 0.4 MG SL tablet Place 1 tablet (0.4 mg total) under the tongue every 5 (five) minutes as needed for chest pain. (Patient not taking: Reported on 07/09/2024) 06/03/2024: Rx was likely expired that pt took today   pantoprazole  (PROTONIX ) 40 MG tablet Take 1 tablet by mouth twice daily    potassium chloride  SA (KLOR-CON  M) 20 MEQ tablet Take 1 tablet (20 mEq total) by mouth daily.    predniSONE  (DELTASONE ) 20 MG tablet Take 40 mg by mouth daily with breakfast.    predniSONE  (STERAPRED UNI-PAK 21 TAB) 10 MG (21) TBPK tablet Take by mouth daily. Take 6 tabs by mouth daily  for 2 days, then 5 tabs for 2 days, then 4 tabs for 2 days, then 3 tabs for 2 days, 2 tabs for 2 days, then  1 tab by mouth daily for 2 days    sucralfate  (CARAFATE ) 1 g tablet Take 1 tablet (1 g total) by mouth with breakfast, with lunch, and with evening meal for 7 days.    tamsulosin  (FLOMAX ) 0.4 MG CAPS capsule TAKE 2 CAPSULES BY MOUTH ONCE DAILY AFTER SUPPER FOR PROSTATE    traMADol  (ULTRAM ) 50 MG tablet Take 1 tablet (50 mg total) by mouth every 12 (twelve) hours as needed.    triamcinolone  cream (KENALOG ) 0.1 % Apply 1 Application topically 2 (two) times daily.    Facility-Administered Encounter Medications as of 07/26/2024  Medication    dupilumab  (DUPIXENT ) prefilled syringe 300 mg     Immunization History  Administered Date(s) Administered   Fluad Quad(high Dose 65+) 05/25/2019, 06/16/2020, 06/20/2021, 05/22/2022   Fluad Trivalent(High Dose 65+) 05/28/2023   Hep A / Hep B 10/23/2018, 11/23/2018, 04/21/2019   INFLUENZA, HIGH DOSE SEASONAL PF 06/17/2017, 06/01/2024   Influenza Split 08/10/2004, 07/30/2005, 07/16/2007, 07/01/2012   Influenza,inj,Quad PF,6+ Mos 07/19/2013, 07/17/2015, 06/25/2016, 06/13/2017, 07/09/2018   Influenza-Unspecified 08/27/2000, 05/17/2010, 06/16/2014   PFIZER(Purple Top)SARS-COV-2 Vaccination 11/21/2019, 12/12/2019   PNEUMOCOCCAL CONJUGATE-20 11/22/2022   Pneumococcal Conjugate-13 02/19/2016   Pneumococcal Polysaccharide-23 11/13/2011   Td 07/12/2005   Tdap 10/30/2012, 03/30/2017, 04/06/2024   Zoster, Live 11/13/2011     PFTs    Latest Ref Rng & Units 11/04/2023    1:41 PM 02/08/2021    9:46 AM 02/25/2019    8:29 AM 07/19/2015    9:20 AM  PFT Results  FVC-Pre L 3.17  3.58  3.49  4.05   FVC-Predicted Pre % 79  87  80  90   FVC-Post L 3.16  3.80  3.08  3.97   FVC-Predicted Post % 79  92  71  88   Pre FEV1/FVC % % 40  62  64  60   Post FEV1/FCV % % 31  57  53  67   FEV1-Pre L 1.28  2.22  2.23  2.45   FEV1-Predicted Pre % 45  75  71  75   FEV1-Post L 0.99  2.17  1.62  2.66   DLCO uncorrected ml/min/mmHg 7.80  8.17  8.36  11.25   DLCO UNC% % 32  33  32  33   DLVA Predicted % 50  44  55  40   TLC L 4.76  5.37  5.35  5.91   TLC % Predicted % 67  76  73  81   RV % Predicted % 52  64  73  90      Assessment   Ohtuvayre  (ensifentrine ) training   Goals of therapy: Mechanism: Ensifentrine  is a dual inhibitor of phosphodiesterase 3 (PDE3) and 4 (PDE4) enzymes. Downstream effects include suppressed inflammatory response and results in smooth muscle relaxation. In phase III studies, ensifentrine  improved lung function as measured by improved average post-bronchodilator FEV1 and dyspnea.    Side effects: hypertension (2%), back pain (2%), diarrhea (1%)  Warnings: paradoxical bronchospasm may occur with use of inhaled bronchodilating agents  Dose: 3mg  twice daily by nebulizer  Administration/Storage:  Remove OHTUVAYRE  unit-dose ampule from foil pouch only immediately before use.  Shake OHTUVAYRE  ampule vigorously.  Squeeze and completely empty contents of the ampule into the nebulizer cup for  administration of OHTUVAYRE  by oral inhalation. Discard ampule with any residual content.  Administer OHTUVAYRE  by oral inhalation using a standard jet nebulizer equipped with a mouthpiece, connected to an air compressor  Access: Benefits investigation through [facility] demonstrates Rx was triaged to [facility]. [facility] Patient ID: [id number] Pharmacy phone: [phone] [facility] Phone#: [phone]   Ohtuvayre  self-administered in office today with assistance of PharmD:   Ohtuvayre  3mg /2.65mL ampule  LOT [id number] Exp 2026/06/15  Medication Reconciliation  A drug regimen assessment was performed, including review of allergies, interactions, disease-state management, dosing and immunization history. Medications were reviewed with the patient, including name, instructions, indication, goals of therapy, potential side effects, importance of adherence, and safe use.  PLAN Continue Ohtuvayre  3mg /2.25mL by nebulizer twice daily. Rx was triaged to: [facility].  Patient has pharmacy contact information. Continue maintenance regimen of: Trelegy 200-62.5-25 mcg/act (1 puffs once daily)  Follow-up with Dr. Theodoro as planned on 08/24/24   All questions encouraged and answered.  Instructed patient to reach out with any further questions or concerns.  Thank you for allowing pharmacy to participate in this patient's care.  This appointment required 30 minutes of patient care (this includes precharting, chart review, review of results, face-to-face  care, etc.).  Aleck Puls, PharmD, BCPS, CPP Clinical Pharmacist  [facility]

## 2024-07-26 NOTE — Progress Notes (Signed)
 Daily Session Note  Patient Details  Name: Benjamin [name] MRN: [id number] Date of Birth: 1940-12-10 Referring Provider:   Flowsheet Row CARDIAC REHAB PHASE II ORIENTATION from 08/11/2019 in [facility] CARDIAC REHABILITATION  Referring Provider Charls    Encounter Date: 07/26/2024  Check In:  Session Check In - 07/26/24 1435       Check-In   Supervising physician immediately available to respond to emergencies See telemetry face sheet for immediately available MD    Location [facility]    Staff Present Laymon Rattler, BSN, RN, WTA-C;Heather Con, BS, Exercise Physiologist;Victoria Zina, RN    Virtual Visit No    Medication changes reported     No    Fall or balance concerns reported    No    Tobacco Cessation No Change    Warm-up and Cool-down Performed on first and last piece of equipment    Resistance Training Performed Yes    VAD Patient? No    PAD/SET Patient? No      Pain Assessment   Currently in Pain? No/denies          Capillary Blood Glucose: No results found for this or any previous visit (from the past 24 hours).    Social History   Tobacco Use  Smoking Status Former   Current packs/day: 0.00   Average packs/day: 0.5 packs/day for 50.0 years (25.0 ttl pk-yrs)   Types: Cigarettes   Start date: 34   Quit date: 2005   Years since quitting: 20.8   Passive exposure: Never  Smokeless Tobacco Never    Goals Met:  Proper associated with RPD/PD & O2 Sat Independence with exercise equipment Improved SOB with ADL's Using PLB without cueing & demonstrates good technique Exercise tolerated well No report of concerns or symptoms today Strength training completed today  Goals Unmet:  Not Applicable  Comments: Pt able to follow exercise prescription today without complaint.  Will continue to monitor for progression.

## 2024-07-28 ENCOUNTER — Encounter (HOSPITAL_COMMUNITY)
Admission: RE | Admit: 2024-07-28 | Discharge: 2024-07-28 | Disposition: A | Discharge status: Home / Self Care | Source: Ambulatory Visit

## 2024-07-28 DIAGNOSIS — J449 Chronic obstructive pulmonary disease, unspecified: Secondary | ICD-10-CM | POA: Diagnosis not present

## 2024-07-28 NOTE — Progress Notes (Signed)
 Daily Session Note  Patient Details  Name: Quenton Recendez MRN: [id number] Date of Birth: 04/07/1941 Referring Provider:   Flowsheet Row CARDIAC REHAB PHASE II ORIENTATION from 08/11/2019 in [facility] CARDIAC REHABILITATION  Referring Provider Charls    Encounter Date: 07/28/2024  Check In:  Session Check In - 07/28/24 1418       Check-In   Supervising physician immediately available to respond to emergencies See telemetry face sheet for immediately available MD    Location [facility]    Staff Present Powell Benders, BS, Exercise Physiologist;Nusaiba Guallpa [place] BSN, RN;Debra Vicci, RN, BSN    Virtual Visit No    Medication changes reported     No    Fall or balance concerns reported    No    Tobacco Cessation No Change    Warm-up and Cool-down Performed on first and last piece of equipment    Resistance Training Performed Yes    VAD Patient? No    PAD/SET Patient? No      Pain Assessment   Currently in Pain? No/denies    Pain Score 0-No pain    Multiple Pain Sites No          Capillary Blood Glucose: No results found for this or any previous visit (from the past 24 hours).    Social History   Tobacco Use  Smoking Status Former   Current packs/day: 0.00   Average packs/day: 0.5 packs/day for 50.0 years (25.0 ttl pk-yrs)   Types: Cigarettes   Start date: 34   Quit date: 2005   Years since quitting: 20.8   Passive exposure: Never  Smokeless Tobacco Never    Goals Met:  Proper associated with RPD/PD & O2 Sat Independence with exercise equipment Using PLB without cueing & demonstrates good technique Exercise tolerated well Queuing for purse lip breathing No report of concerns or symptoms today Strength training completed today  Goals Unmet:  Not Applicable  Comments: SABRASABRAPt able to follow exercise prescription today without complaint.  Will continue to monitor for progression.

## 2024-07-30 ENCOUNTER — Encounter (HOSPITAL_COMMUNITY)
Admission: RE | Admit: 2024-07-30 | Discharge: 2024-07-30 | Disposition: A | Discharge status: Home / Self Care | Source: Ambulatory Visit

## 2024-07-30 ENCOUNTER — Telehealth: Payer: Self-pay

## 2024-07-30 DIAGNOSIS — J449 Chronic obstructive pulmonary disease, unspecified: Secondary | ICD-10-CM

## 2024-07-30 DIAGNOSIS — Z8719 Personal history of other diseases of the digestive system: Secondary | ICD-10-CM | POA: Diagnosis not present

## 2024-07-30 DIAGNOSIS — K580 Irritable bowel syndrome with diarrhea: Secondary | ICD-10-CM | POA: Diagnosis not present

## 2024-07-30 DIAGNOSIS — K219 Gastro-esophageal reflux disease without esophagitis: Secondary | ICD-10-CM | POA: Diagnosis not present

## 2024-07-30 MED ORDER — AIRSUPRA 90-80 MCG/ACT IN AERO: 2.0000 | INHALATION_SPRAY | RESPIRATORY_TRACT | 2 refills | Status: AC | PRN

## 2024-07-30 NOTE — Telephone Encounter (Signed)
 Patient came in requesting Airspura rx be sent in.It has now been sent to [facility] in [facility]. I called the patient's wife and informed.

## 2024-07-30 NOTE — Progress Notes (Signed)
 Daily Session Note  Patient Details  Name: Benjamin [name] MRN: [id number] Date of Birth: May 12, 1941 Referring Provider:   Flowsheet Row CARDIAC REHAB PHASE II ORIENTATION from 08/11/2019 in [facility] CARDIAC REHABILITATION  Referring Provider Charls    Encounter Date: 07/30/2024  Check In:  Session Check In - 07/30/24 1300       Check-In   Supervising physician immediately available to respond to emergencies See telemetry face sheet for immediately available MD    Location [facility]    Staff Present Richerd Buddle, RN;Mane Consolo Vicci, RN, BSN;Brittany Jackquline, BSN, RN, WTA-C    Virtual Visit No    Medication changes reported     No    Fall or balance concerns reported    No    Warm-up and Cool-down Performed on first and last piece of equipment    Resistance Training Performed Yes    VAD Patient? No    PAD/SET Patient? No      Pain Assessment   Currently in Pain? No/denies    Pain Score 0-No pain    Multiple Pain Sites No          Capillary Blood Glucose: No results found for this or any previous visit (from the past 24 hours).    Social History   Tobacco Use  Smoking Status Former   Current packs/day: 0.00   Average packs/day: 0.5 packs/day for 50.0 years (25.0 ttl pk-yrs)   Types: Cigarettes   Start date: 53   Quit date: 2005   Years since quitting: 20.8   Passive exposure: Never  Smokeless Tobacco Never    Goals Met:  Proper associated with RPD/PD & O2 Sat Independence with exercise equipment Using PLB without cueing & demonstrates good technique Exercise tolerated well No report of concerns or symptoms today Strength training completed today  Goals Unmet:  Not Applicable  Comments: Pt able to follow exercise prescription today without complaint.  Will continue to monitor for progression.

## 2024-08-02 ENCOUNTER — Encounter (HOSPITAL_COMMUNITY)
Admission: RE | Admit: 2024-08-02 | Discharge: 2024-08-02 | Disposition: A | Discharge status: Home / Self Care | Source: Ambulatory Visit

## 2024-08-02 DIAGNOSIS — J479 Bronchiectasis, uncomplicated: Secondary | ICD-10-CM | POA: Diagnosis not present

## 2024-08-02 DIAGNOSIS — J449 Chronic obstructive pulmonary disease, unspecified: Secondary | ICD-10-CM

## 2024-08-02 NOTE — Progress Notes (Signed)
 Daily Session Note  Patient Details  Name: Kostantinos Tallman MRN: [id number] Date of Birth: [birth year] Referring Provider:   Flowsheet Row CARDIAC REHAB PHASE II ORIENTATION from 08/11/2019 in [facility] CARDIAC REHABILITATION  Referring Provider Charls    Encounter Date: 08/02/2024  Check In:  Session Check In - 08/02/24 1439       Check-In   Supervising physician immediately available to respond to emergencies See telemetry face sheet for immediately available MD    Location [facility]    Staff Present Adrien Louder, RN, BSN;Hale Chalfin Zina, RN    Virtual Visit No    Medication changes reported     No    Fall or balance concerns reported    No    Warm-up and Cool-down Performed on first and last piece of equipment    Resistance Training Performed Yes    VAD Patient? No    PAD/SET Patient? No      Pain Assessment   Currently in Pain? No/denies          Capillary Blood Glucose: No results found for this or any previous visit (from the past 24 hours).    Social History   Tobacco Use  Smoking Status Former   Current packs/day: 0.00   Average packs/day: 0.5 packs/day for 50.0 years (25.0 ttl pk-yrs)   Types: Cigarettes   Start date: 59   Quit date: 2005   Years since quitting: 20.8   Passive exposure: Never  Smokeless Tobacco Never    Goals Met:  Proper associated with RPD/PD & O2 Sat Independence with exercise equipment Using PLB without cueing & demonstrates good technique Exercise tolerated well No report of concerns or symptoms today Strength training completed today  Goals Unmet:  Not Applicable  Comments: Pt able to follow exercise prescription today without complaint.  Will continue to monitor for progression.

## 2024-08-03 DIAGNOSIS — I130 Hypertensive heart and chronic kidney disease with heart failure and stage 1 through stage 4 chronic kidney disease, or unspecified chronic kidney disease: Secondary | ICD-10-CM | POA: Diagnosis not present

## 2024-08-03 DIAGNOSIS — I2510 Atherosclerotic heart disease of native coronary artery without angina pectoris: Secondary | ICD-10-CM | POA: Diagnosis not present

## 2024-08-03 DIAGNOSIS — K219 Gastro-esophageal reflux disease without esophagitis: Secondary | ICD-10-CM | POA: Diagnosis not present

## 2024-08-03 DIAGNOSIS — J9610 Chronic respiratory failure, unspecified whether with hypoxia or hypercapnia: Secondary | ICD-10-CM | POA: Diagnosis not present

## 2024-08-03 DIAGNOSIS — J479 Bronchiectasis, uncomplicated: Secondary | ICD-10-CM | POA: Diagnosis not present

## 2024-08-03 DIAGNOSIS — I509 Heart failure, unspecified: Secondary | ICD-10-CM | POA: Diagnosis not present

## 2024-08-03 DIAGNOSIS — J4489 Other specified chronic obstructive pulmonary disease: Secondary | ICD-10-CM | POA: Diagnosis not present

## 2024-08-03 DIAGNOSIS — E785 Hyperlipidemia, unspecified: Secondary | ICD-10-CM | POA: Diagnosis not present

## 2024-08-03 DIAGNOSIS — I700 Atherosclerosis of aorta: Secondary | ICD-10-CM | POA: Diagnosis not present

## 2024-08-04 ENCOUNTER — Encounter (HOSPITAL_COMMUNITY)
Admission: RE | Admit: 2024-08-04 | Discharge: 2024-08-04 | Disposition: A | Discharge status: Home / Self Care | Source: Ambulatory Visit

## 2024-08-04 DIAGNOSIS — J449 Chronic obstructive pulmonary disease, unspecified: Secondary | ICD-10-CM

## 2024-08-04 NOTE — Progress Notes (Signed)
 Incomplete Session Note  Patient Details  Name: Benjamin [name] MRN: [id number] Date of Birth: 12-Jul-1941 Referring Provider:   Flowsheet Row CARDIAC REHAB PHASE II ORIENTATION from 08/11/2019 in [facility] CARDIAC REHABILITATION  Referring Provider [name] did not complete his rehab session.  He fell yesterday and is having generalized pain today.  He is staying for education but not staying to exercise.  His vitals are 94/56, 86, 92% on RA.

## 2024-08-05 DIAGNOSIS — M25362 Other instability, left knee: Secondary | ICD-10-CM | POA: Diagnosis not present

## 2024-08-05 DIAGNOSIS — M25521 Pain in right elbow: Secondary | ICD-10-CM | POA: Diagnosis not present

## 2024-08-06 ENCOUNTER — Encounter (HOSPITAL_COMMUNITY)

## 2024-08-09 ENCOUNTER — Encounter (HOSPITAL_COMMUNITY)

## 2024-08-11 ENCOUNTER — Encounter (HOSPITAL_COMMUNITY)
Admission: RE | Admit: 2024-08-11 | Discharge: 2024-08-11 | Disposition: A | Discharge status: Home / Self Care | Source: Ambulatory Visit

## 2024-08-11 DIAGNOSIS — J449 Chronic obstructive pulmonary disease, unspecified: Secondary | ICD-10-CM

## 2024-08-11 NOTE — Progress Notes (Signed)
 Daily Session Note  Patient Details  Name: Benjamin [name] MRN: [id number] Date of Birth: [birth year] Referring Provider:   Flowsheet Row CARDIAC REHAB PHASE II ORIENTATION from 08/11/2019 in [facility] CARDIAC REHABILITATION  Referring Provider Charls    Encounter Date: 08/11/2024  Check In:  Session Check In - 08/11/24 1317       Check-In   Supervising physician immediately available to respond to emergencies See telemetry face sheet for immediately available MD    Location [facility]    Staff Present Powell Benders, BS, Exercise Physiologist;Dayanis Bergquist Dean BSN, RN;Victoria Zina, RN    Virtual Visit No    Medication changes reported     No    Fall or balance concerns reported    No    Tobacco Cessation No Change    Warm-up and Cool-down Performed on first and last piece of equipment    Resistance Training Performed Yes    VAD Patient? No    PAD/SET Patient? No      Pain Assessment   Currently in Pain? No/denies    Pain Score 0-No pain    Multiple Pain Sites No          Capillary Blood Glucose: No results found for this or any previous visit (from the past 24 hours).    Social History   Tobacco Use  Smoking Status Former   Current packs/day: 0.00   Average packs/day: 0.5 packs/day for 50.0 years (25.0 ttl pk-yrs)   Types: Cigarettes   Start date: 6   Quit date: 2005   Years since quitting: 20.9   Passive exposure: Never  Smokeless Tobacco Never    Goals Met:  Proper associated with RPD/PD & O2 Sat Independence with exercise equipment Using PLB without cueing & demonstrates good technique Exercise tolerated well Queuing for purse lip breathing No report of concerns or symptoms today Strength training completed today  Goals Unmet:  Not Applicable  Comments: SABRASABRAPt able to follow exercise prescription today without complaint.  Will continue to monitor for progression.

## 2024-08-16 ENCOUNTER — Telehealth: Payer: Self-pay

## 2024-08-16 ENCOUNTER — Other Ambulatory Visit: Payer: Self-pay

## 2024-08-16 ENCOUNTER — Ambulatory Visit: Payer: Self-pay

## 2024-08-16 ENCOUNTER — Emergency Department (HOSPITAL_COMMUNITY)

## 2024-08-16 ENCOUNTER — Inpatient Hospital Stay (HOSPITAL_COMMUNITY)
Admission: EM | Admit: 2024-08-16 | Discharge: 2024-08-19 | DRG: 871 | Disposition: A | Attending: Internal Medicine | Admitting: Internal Medicine

## 2024-08-16 ENCOUNTER — Encounter (HOSPITAL_COMMUNITY): Payer: Self-pay

## 2024-08-16 ENCOUNTER — Encounter (HOSPITAL_COMMUNITY)
Admission: RE | Admit: 2024-08-16 | Discharge: 2024-08-16 | Disposition: A | Discharge status: Home / Self Care | Source: Ambulatory Visit

## 2024-08-16 DIAGNOSIS — Z951 Presence of aortocoronary bypass graft: Secondary | ICD-10-CM

## 2024-08-16 DIAGNOSIS — J42 Unspecified chronic bronchitis: Secondary | ICD-10-CM | POA: Diagnosis not present

## 2024-08-16 DIAGNOSIS — R6520 Severe sepsis without septic shock: Principal | ICD-10-CM

## 2024-08-16 DIAGNOSIS — J189 Pneumonia, unspecified organism: Secondary | ICD-10-CM | POA: Diagnosis present

## 2024-08-16 DIAGNOSIS — R7303 Prediabetes: Secondary | ICD-10-CM

## 2024-08-16 DIAGNOSIS — J9601 Acute respiratory failure with hypoxia: Secondary | ICD-10-CM | POA: Diagnosis not present

## 2024-08-16 DIAGNOSIS — I5032 Chronic diastolic (congestive) heart failure: Secondary | ICD-10-CM | POA: Diagnosis not present

## 2024-08-16 DIAGNOSIS — R918 Other nonspecific abnormal finding of lung field: Secondary | ICD-10-CM | POA: Diagnosis not present

## 2024-08-16 DIAGNOSIS — R0789 Other chest pain: Secondary | ICD-10-CM | POA: Diagnosis not present

## 2024-08-16 DIAGNOSIS — R7989 Other specified abnormal findings of blood chemistry: Secondary | ICD-10-CM | POA: Diagnosis not present

## 2024-08-16 DIAGNOSIS — A419 Sepsis, unspecified organism: Secondary | ICD-10-CM | POA: Diagnosis not present

## 2024-08-16 DIAGNOSIS — R0602 Shortness of breath: Secondary | ICD-10-CM | POA: Diagnosis not present

## 2024-08-16 DIAGNOSIS — J188 Other pneumonia, unspecified organism: Secondary | ICD-10-CM | POA: Diagnosis not present

## 2024-08-16 DIAGNOSIS — J449 Chronic obstructive pulmonary disease, unspecified: Secondary | ICD-10-CM | POA: Insufficient documentation

## 2024-08-16 DIAGNOSIS — I700 Atherosclerosis of aorta: Secondary | ICD-10-CM | POA: Diagnosis not present

## 2024-08-16 LAB — LACTIC ACID, PLASMA
Lactic Acid, Venous: 3.1 mmol/L (ref 0.5–1.9)
Lactic Acid, Venous: 4.7 mmol/L (ref 0.5–1.9)
Lactic Acid, Venous: 7.2 mmol/L (ref 0.5–1.9)

## 2024-08-16 LAB — COMPREHENSIVE METABOLIC PANEL WITH GFR
ALT: 18 U/L (ref 0–44)
AST: 18 U/L (ref 15–41)
Albumin: 4.1 g/dL (ref 3.5–5.0)
Alkaline Phosphatase: 44 U/L (ref 38–126)
Anion gap: 15 (ref 5–15)
BUN: 18 mg/dL (ref 8–23)
CO2: 24 mmol/L (ref 22–32)
Calcium: 9.2 mg/dL (ref 8.9–10.3)
Chloride: 101 mmol/L (ref 98–111)
Creatinine, Ser: 1.07 mg/dL (ref 0.61–1.24)
GFR, Estimated: >60 mL/min (ref >60)
Glucose, Bld: 120 mg/dL — ABNORMAL HIGH (ref 70–99)
Potassium: 4.3 mmol/L (ref 3.5–5.1)
Sodium: 139 mmol/L (ref 135–145)
Total Bilirubin: 1.9 mg/dL — ABNORMAL HIGH (ref 0.0–1.2)
Total Protein: 6.3 g/dL — ABNORMAL LOW (ref 6.5–8.1)

## 2024-08-16 LAB — CBC WITH DIFFERENTIAL/PLATELET
Abs Immature Granulocytes: 0.19 K/uL — ABNORMAL HIGH (ref 0.00–0.07)
Basophils Absolute: 0.1 K/uL (ref 0.0–0.1)
Basophils Relative: 1 %
Eosinophils Absolute: 0.1 K/uL (ref 0.0–0.5)
Eosinophils Relative: 1 %
HCT: 50.1 % (ref 39.0–52.0)
Hemoglobin: 16.8 g/dL (ref 13.0–17.0)
Immature Granulocytes: 1 %
Lymphocytes Relative: 10 %
Lymphs Abs: 1.6 K/uL (ref 0.7–4.0)
MCH: 32.2 pg (ref 26.0–34.0)
MCHC: 33.5 g/dL (ref 30.0–36.0)
MCV: 96 fL (ref 80.0–100.0)
Monocytes Absolute: 0.8 K/uL (ref 0.1–1.0)
Monocytes Relative: 5 %
Neutro Abs: 12.5 K/uL — ABNORMAL HIGH (ref 1.7–7.7)
Neutrophils Relative %: 82 %
Platelets: 271 K/uL (ref 150–400)
RBC: 5.22 MIL/uL (ref 4.22–5.81)
RDW: 14.1 % (ref 11.5–15.5)
WBC: 15.3 K/uL — ABNORMAL HIGH (ref 4.0–10.5)
nRBC: 0 % (ref 0.0–0.2)

## 2024-08-16 LAB — PRO BRAIN NATRIURETIC PEPTIDE: Pro Brain Natriuretic Peptide: 390 pg/mL — ABNORMAL HIGH (ref <300.0)

## 2024-08-16 LAB — TROPONIN T, HIGH SENSITIVITY
Troponin T High Sensitivity: <15 ng/L (ref 0–19)
Troponin T High Sensitivity: <15 ng/L (ref 0–19)

## 2024-08-16 MED ORDER — SODIUM CHLORIDE 0.9 % IV SOLN
500.0000 mg | INTRAVENOUS | Status: AC
  Administered 2024-08-17 – 2024-08-18 (×2): 500 mg via INTRAVENOUS
  Filled 2024-08-16 (×3): qty 5

## 2024-08-16 MED ORDER — DEXAMETHASONE SOD PHOSPHATE PF 10 MG/ML IJ SOLN
10.0000 mg | Freq: Once | INTRAMUSCULAR | Status: AC
  Administered 2024-08-16: 10 mg via INTRAVENOUS

## 2024-08-16 MED ORDER — PANTOPRAZOLE SODIUM 40 MG PO TBEC
40.0000 mg | DELAYED_RELEASE_TABLET | Freq: Every day | ORAL | Status: DC
  Administered 2024-08-17 – 2024-08-19 (×3): 40 mg via ORAL
  Filled 2024-08-16 (×4): qty 1

## 2024-08-16 MED ORDER — TAMSULOSIN HCL 0.4 MG PO CAPS
0.4000 mg | ORAL_CAPSULE | Freq: Every day | ORAL | Status: DC
  Administered 2024-08-17 – 2024-08-19 (×3): 0.4 mg via ORAL
  Filled 2024-08-16 (×3): qty 1

## 2024-08-16 MED ORDER — PANTOPRAZOLE SODIUM 40 MG PO TBEC: 40.0000 mg | DELAYED_RELEASE_TABLET | Freq: Two times a day (BID) | ORAL | Status: DC

## 2024-08-16 MED ORDER — ALBUTEROL SULFATE (2.5 MG/3ML) 0.083% IN NEBU
INHALATION_SOLUTION | RESPIRATORY_TRACT | Status: AC
  Filled 2024-08-16: qty 12

## 2024-08-16 MED ORDER — ALBUTEROL SULFATE (2.5 MG/3ML) 0.083% IN NEBU
10.0000 mg/h | INHALATION_SOLUTION | Freq: Once | RESPIRATORY_TRACT | Status: AC
  Administered 2024-08-16: 10 mg/h via RESPIRATORY_TRACT

## 2024-08-16 MED ORDER — LACTATED RINGERS IV SOLN: INTRAVENOUS | Status: AC

## 2024-08-16 MED ORDER — ALBUTEROL-BUDESONIDE 90-80 MCG/ACT IN AERO: 2.0000 | INHALATION_SPRAY | RESPIRATORY_TRACT | Status: DC | PRN

## 2024-08-16 MED ORDER — ONDANSETRON HCL 4 MG/2ML IJ SOLN: 4.0000 mg | Freq: Four times a day (QID) | INTRAMUSCULAR | Status: DC | PRN

## 2024-08-16 MED ORDER — LACTATED RINGERS IV BOLUS
1000.0000 mL | Freq: Once | INTRAVENOUS | Status: AC
  Administered 2024-08-16: 1000 mL via INTRAVENOUS

## 2024-08-16 MED ORDER — IPRATROPIUM BROMIDE 0.06 % NA SOLN
2.0000 | Freq: Two times a day (BID) | NASAL | Status: DC
  Filled 2024-08-16: qty 15

## 2024-08-16 MED ORDER — TRAMADOL HCL 50 MG PO TABS
50.0000 mg | ORAL_TABLET | Freq: Two times a day (BID) | ORAL | Status: DC | PRN
  Administered 2024-08-17: 50 mg via ORAL
  Filled 2024-08-16: qty 1

## 2024-08-16 MED ORDER — ACETAMINOPHEN 500 MG PO TABS
500.0000 mg | ORAL_TABLET | Freq: Three times a day (TID) | ORAL | Status: DC | PRN
  Administered 2024-08-17 (×2): 500 mg via ORAL
  Filled 2024-08-16 (×2): qty 1

## 2024-08-16 MED ORDER — IPRATROPIUM BROMIDE 0.06 % NA SOLN
2.0000 | Freq: Two times a day (BID) | NASAL | Status: DC
  Administered 2024-08-16 – 2024-08-19 (×6): 2 via NASAL
  Filled 2024-08-16: qty 15

## 2024-08-16 MED ORDER — LACTATED RINGERS IV BOLUS (SEPSIS)
500.0000 mL | Freq: Once | INTRAVENOUS | Status: AC
  Administered 2024-08-16: 500 mL via INTRAVENOUS

## 2024-08-16 MED ORDER — SODIUM CHLORIDE 0.9 % IV SOLN
1.0000 g | INTRAVENOUS | Status: DC
  Administered 2024-08-17 – 2024-08-18 (×2): 1 g via INTRAVENOUS
  Filled 2024-08-16 (×2): qty 10

## 2024-08-16 MED ORDER — BUDESONIDE 0.25 MG/2ML IN SUSP
0.2500 mg | Freq: Two times a day (BID) | RESPIRATORY_TRACT | Status: DC
  Administered 2024-08-16 – 2024-08-18 (×4): 0.25 mg via RESPIRATORY_TRACT
  Filled 2024-08-16 (×4): qty 2

## 2024-08-16 MED ORDER — SODIUM CHLORIDE 0.9 % IV SOLN: 1.0000 g | INTRAVENOUS | Status: DC

## 2024-08-16 MED ORDER — IPRATROPIUM-ALBUTEROL 0.5-2.5 (3) MG/3ML IN SOLN
3.0000 mL | RESPIRATORY_TRACT | Status: DC | PRN
  Administered 2024-08-17 – 2024-08-18 (×2): 3 mL via RESPIRATORY_TRACT
  Filled 2024-08-16 (×2): qty 3

## 2024-08-16 MED ORDER — FINASTERIDE 5 MG PO TABS
5.0000 mg | ORAL_TABLET | Freq: Every day | ORAL | Status: DC
  Administered 2024-08-17 – 2024-08-19 (×3): 5 mg via ORAL
  Filled 2024-08-16 (×3): qty 1

## 2024-08-16 MED ORDER — SODIUM CHLORIDE 0.9 % IV SOLN
2.0000 g | Freq: Once | INTRAVENOUS | Status: AC
  Administered 2024-08-16: 2 g via INTRAVENOUS
  Filled 2024-08-16: qty 20

## 2024-08-16 MED ORDER — ENOXAPARIN SODIUM 40 MG/0.4ML IJ SOSY
40.0000 mg | PREFILLED_SYRINGE | INTRAMUSCULAR | Status: DC
  Administered 2024-08-16 – 2024-08-18 (×3): 40 mg via SUBCUTANEOUS
  Filled 2024-08-16 (×3): qty 0.4

## 2024-08-16 MED ORDER — SODIUM CHLORIDE 0.9 % IV SOLN
500.0000 mg | Freq: Once | INTRAVENOUS | Status: AC
  Administered 2024-08-16: 500 mg via INTRAVENOUS
  Filled 2024-08-16: qty 5

## 2024-08-16 NOTE — Sepsis Progress Note (Signed)
Notified provider of need to order lactic acid. ° °

## 2024-08-16 NOTE — ED Triage Notes (Signed)
 Pt arrived via POV from home c/o SOB that worsened this morning. Pt reports trying his rescue inhaler and neb treatments w/o relief. Pt presents on 2L Nasal Cannula at baseline. Pt reports he was scheduled for Pulmonary Rehab today as well.

## 2024-08-16 NOTE — Telephone Encounter (Signed)
 Copied from CRM #[id number]. Topic: Clinical - Medical Advice >> Aug 16, 2024  1:34 PM Devaughn RAMAN wrote: Reason for CRM: Pt's wife Clara called because pt is very short of breath trouble using rescue inhaler and currently using oxygen . On hold for Nurse Triage for over 12 minutes, she disconnected call. Please f/u with pt.    Tried to reach out to patient VM/LM - patient has went to ED

## 2024-08-16 NOTE — ED Notes (Signed)
 Requested lab to get cultures so we can give ABT.

## 2024-08-16 NOTE — Telephone Encounter (Signed)
 Reason for Disposition . Patient already left for the hospital/clinic.  Answer Assessment - Initial Assessment Questions Patient is currently in the Emergency Department at [facility].  Protocols used: No Contact or Duplicate Contact Call-A-AH

## 2024-08-16 NOTE — Sepsis Progress Note (Signed)
 Sepsis protocol monitored by eLink

## 2024-08-16 NOTE — Telephone Encounter (Signed)
 Per chart review, patient is currently in [facility] Emergency Department. No triage for this encounter.   Copied from CRM #[id number]. Topic: Clinical - Red Word Triage >> Aug 16, 2024  1:20 PM Benjamin [name] wrote: Red Word that prompted transfer to Nurse Triage: very short of breath trouble using rescue inhaler and currently using oxygen .

## 2024-08-16 NOTE — ED Provider Notes (Signed)
 [place] EMERGENCY DEPARTMENT AT [facility] Provider Note   CSN: [id number] Arrival date & time: 08/16/24  1410     Patient presents with: Shortness of Breath   Benjamin [name] is a 83 y.o. male.   HPI Patient presents with his wife who assist with the history.  Patient awoke at 5 AM, 10 hours ago with shortness of breath.  He completed a tapered course of steroids yesterday, otherwise no recent medication change, diet change, activity change.  He is not on chronic home oxygen , does have a history of COPD, does use oxygen  as needed. Today no fevers, chills, syncope, fall.  He has had persistent shortness of breath in spite of using multiple rescue breather attempts at home.     Prior to Admission medications   Medication Sig Start Date End Date Taking? Authorizing Provider  Albuterol -Budesonide  (AIRSUPRA ) 90-80 MCG/ACT AERO Inhale 2 puffs into the lungs every 4 (four) hours as needed. 07/30/24  Yes Ambs, Arlean HERO, FNP  Ensifentrine  3 MG/2.5ML SUSP Inhale 3 mg into the lungs in the morning and at bedtime.   Yes [provider]  acetaminophen  (TYLENOL ) 500 MG tablet Take 500 mg by mouth every 8 (eight) hours as needed for mild pain (pain score 1-3).    [provider]  COLESTID  1 g tablet Take 2 g by mouth 2 (two) times daily.    [provider]  Cranberry 400 MG TABS Take 2 tablets by mouth 2 (two) times daily.    [provider]  Evolocumab  (REPATHA  SURECLICK) 140 MG/ML SOAJ INJECT 140MG  SUBCUTANEOUSLY EVERY 14 DAYS AS DIRECTED BY MD 01/05/24   Mona Vinie BROCKS, MD  finasteride  (PROSCAR ) 5 MG tablet Take 5 mg by mouth daily. 10/17/23   [provider]  Fluticasone -Umeclidin-Vilant (TRELEGY ELLIPTA ) 200-62.5-25 MCG/ACT AEPB USE 1 INHALATION ORALLY IN THE MORNING 04/28/24   Iva Marty Saltness, MD  furosemide  (LASIX ) 20 MG tablet Take 1 tablet (20 mg total) by mouth daily as needed for fluid or edema (for swelling). 08/01/23 07/09/24   Miriam Norris, NP  ipratropium (ATROVENT ) 0.06 % nasal spray Place 2 sprays into both nostrils 3 (three) times daily. Patient not taking: Reported on 07/09/2024 11/17/23   Iva Marty Saltness, MD  levalbuterol  (XOPENEX  HFA) 45 MCG/ACT inhaler Inhale 1-2 puffs into the lungs every 4 (four) hours as needed for wheezing.    [provider]  metoprolol  tartrate (LOPRESSOR ) 25 MG tablet Take 1/2 (one-half) tablet by mouth twice daily Patient taking differently: Take 12.5 mg by mouth 2 (two) times daily. 12/04/23   Debera Jayson MATSU, MD  nitroGLYCERIN  (NITROSTAT ) 0.4 MG SL tablet Place 1 tablet (0.4 mg total) under the tongue every 5 (five) minutes as needed for chest pain. Patient not taking: Reported on 07/09/2024 03/28/23 07/02/24  Miriam Norris, NP  pantoprazole  (PROTONIX ) 40 MG tablet Take 1 tablet by mouth twice daily Patient not taking: Reported on 08/16/2024 01/30/24   Tobie Suzzane POUR, MD  potassium chloride  SA (KLOR-CON  M) 20 MEQ tablet Take 1 tablet (20 mEq total) by mouth daily. Patient not taking: Reported on 08/16/2024 06/01/24   Tobie Suzzane POUR, MD  predniSONE  (DELTASONE ) 20 MG tablet Take 40 mg by mouth daily with breakfast.    [provider]  predniSONE  (STERAPRED UNI-PAK 21 TAB) 10 MG (21) TBPK tablet Take by mouth daily. Take 6 tabs by mouth daily  for 2 days, then 5 tabs for 2 days, then 4 tabs for 2 days, then 3  tabs for 2 days, 2 tabs for 2 days, then 1 tab by mouth daily for 2 days 06/04/24   Elnor Savant A, DO  tamsulosin  (FLOMAX ) 0.4 MG CAPS capsule TAKE 2 CAPSULES BY MOUTH ONCE DAILY AFTER SUPPER FOR PROSTATE 06/04/24   Tobie Suzzane POUR, MD  traMADol  (ULTRAM ) 50 MG tablet Take 1 tablet (50 mg total) by mouth every 12 (twelve) hours as needed. 06/01/24   Tobie Suzzane POUR, MD  triamcinolone  cream (KENALOG ) 0.1 % Apply 1 Application topically 2 (two) times daily. 05/20/24   Leath-Warren, Etta PARAS, NP    Allergies: Arsenic, Contrast media [iodinated contrast media],  Nitrofurantoin  monohyd macro, and Statins    Review of Systems  Updated Vital Signs BP (!) 106/58   Pulse (!) 119   Temp 97.7 F (36.5 C) (Oral)   Resp (!) 21   Ht 1.778 m (5' 10)   Wt 85.1 kg   SpO2 93%   BMI 26.92 kg/m   Physical Exam Vitals and nursing note reviewed.  Constitutional:      General: He is not in acute distress.    Appearance: He is well-developed.  HENT:     Head: Normocephalic and atraumatic.     Comments: Edentulous Eyes:     Conjunctiva/sclera: Conjunctivae normal.  Cardiovascular:     Rate and Rhythm: Regular rhythm. Tachycardia present.  Pulmonary:     Effort: Tachypnea, accessory muscle usage and respiratory distress present.     Breath sounds: No stridor. Decreased breath sounds present.  Abdominal:     General: There is no distension.  Skin:    General: Skin is warm and dry.  Neurological:     Mental Status: He is alert and oriented to person, place, and time.     (all labs ordered are listed, but only abnormal results are displayed) Labs Reviewed  CBC WITH DIFFERENTIAL/PLATELET - Abnormal; Notable for the following components:      Result Value   WBC 15.3 (*)    Neutro Abs 12.5 (*)    Abs Immature Granulocytes 0.19 (*)    All other components within normal limits  CULTURE, BLOOD (SINGLE)  COMPREHENSIVE METABOLIC PANEL WITH GFR  PRO BRAIN NATRIURETIC PEPTIDE  TROPONIN T, HIGH SENSITIVITY  TROPONIN T, HIGH SENSITIVITY    EKG: EKG Interpretation Date/Time:  Monday August 16 2024 14:29:42 EST Ventricular Rate:  113 PR Interval:  136 QRS Duration:  80 QT Interval:  320 QTC Calculation: 438 R Axis:   62  Text Interpretation: Sinus tachycardia Possible Inferior infarct , age undetermined Abnormal ECG Confirmed by Garrick Charleston [phone]) on 08/16/2024 3:28:09 PM  Radiology: DG Chest 2 View Result Date: 08/16/2024 CLINICAL DATA:  Shortness of breath EXAM: CHEST - 2 VIEW COMPARISON:  Chest x-ray 06/03/2024 FINDINGS: There are  patchy airspace opacities in the right mid lung N inferior left lower lobe. No definite pleural effusion or pneumothorax. Patient is status post cardiac surgery. Cardiomediastinal silhouette is stable. No acute fractures are seen. IMPRESSION: Patchy airspace opacities in the right mid lung and inferior left lower lobe, concerning for multifocal pneumonia. Follow-up imaging recommended to confirm complete resolution. Electronically Signed   By: Greig Pique M.D.   On: 08/16/2024 15:39     Procedures   Medications Ordered in the ED  lactated ringers  infusion (has no administration in time range)  lactated ringers  bolus 500 mL (500 mLs Intravenous Bolus 08/16/24 1716)  cefTRIAXone  (ROCEPHIN ) 2 g in sodium chloride  0.9 % 100 mL IVPB (2 g  Intravenous New Bag/Given 08/16/24 1659)  azithromycin  (ZITHROMAX ) 500 mg in sodium chloride  0.9 % 250 mL IVPB (500 mg Intravenous New Bag/Given 08/16/24 1715)  dexamethasone  (DECADRON ) injection 10 mg (10 mg Intravenous Given 08/16/24 1538)  albuterol  (PROVENTIL ) (2.5 MG/3ML) 0.083% nebulizer solution (10 mg/hr Nebulization Given 08/16/24 1534)  albuterol  (PROVENTIL ) (2.5 MG/3ML) 0.083% nebulizer solution (  Given 08/16/24 1534)                                    Medical Decision Making Adult 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  Patient tachycardic, tachypneic, hypotensive though he notes that his blood pressure is typically low.  With respiratory distress, concern for fluid overload status for COPD exacerbation versus pneumonia. Pulse ox 97% 2 L abnormal Cardiac 115 sinus tach abnormal   Amount and/or Complexity of Data Reviewed Independent Historian: spouse External Data Reviewed: notes. Labs: ordered. Decision-making details documented in ED Course. Radiology: ordered and independent interpretation performed. Decision-making details documented in ED Course. ECG/medicine tests: ordered and independent interpretation performed.  Decision-making details documented in ED Course.  Risk Prescription drug management. Decision regarding hospitalization. Diagnosis or treatment significantly limited by social determinants of health.   5:18 PM Patient markedly improved after continuous neb, but remains mildly tachycardic, tachypneic.  Patient on 2 L to saturate 94%. Patient met SIRS criteria on arrival, with x-ray suggest pneumonia was designated as a code sepsis. Initial lactic pending, but the patient has improved markedly clinically.  Concern for severe sepsis with respiratory compromise patient will be admitted for monitoring, management.  CRITICAL CARE Performed by: Lamar Salen Total critical care time: 35 minutes Critical care time was exclusive of separately billable procedures and treating other patients. Critical care was necessary to treat or prevent imminent or life-threatening deterioration. Critical care was time spent personally by me on the following activities: development of treatment plan with patient and/or surrogate as well as nursing, discussions with consultants, evaluation of patient's response to treatment, examination of patient, obtaining history from patient or surrogate, ordering and performing treatments and interventions, ordering and review of laboratory studies, ordering and review of radiographic studies, pulse oximetry and re-evaluation of patient's condition.   Final diagnoses:  Severe sepsis [facility])    ED Discharge Orders     None          Salen Lamar, MD 08/16/24 2250

## 2024-08-16 NOTE — ED Notes (Signed)
 Patient given microwave meal and juice per request.

## 2024-08-16 NOTE — Telephone Encounter (Signed)
 Currently at [facility] ED.  Nothing further needed.
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
## 2024-08-16 NOTE — Progress Notes (Addendum)
 CODE SEPSIS - PHARMACY COMMUNICATION  **Broad Spectrum Antibiotics should be administered within 1 hour of Sepsis diagnosis**  Time Code Sepsis Called/Page Received: 16:30  Antibiotics Ordered: Ceftriaxone  and Azithromycin   Time of 1st antibiotic administration: 16:59  Additional action taken by pharmacy: N/A   If necessary, Name of Provider/Nurse Contacted: N/A    Ransom Blanch PGY-1 Pharmacy Resident  [facility]  08/16/2024 5:02 PM

## 2024-08-17 ENCOUNTER — Encounter (HOSPITAL_COMMUNITY): Payer: Self-pay | Admitting: *Deleted

## 2024-08-17 DIAGNOSIS — A419 Sepsis, unspecified organism: Principal | ICD-10-CM | POA: Diagnosis present

## 2024-08-17 DIAGNOSIS — R7989 Other specified abnormal findings of blood chemistry: Secondary | ICD-10-CM | POA: Diagnosis not present

## 2024-08-17 DIAGNOSIS — J189 Pneumonia, unspecified organism: Secondary | ICD-10-CM

## 2024-08-17 DIAGNOSIS — I5032 Chronic diastolic (congestive) heart failure: Secondary | ICD-10-CM | POA: Diagnosis not present

## 2024-08-17 DIAGNOSIS — J449 Chronic obstructive pulmonary disease, unspecified: Secondary | ICD-10-CM

## 2024-08-17 LAB — CBC
HCT: 45.8 % (ref 39.0–52.0)
Hemoglobin: 15 g/dL (ref 13.0–17.0)
MCH: 32.2 pg (ref 26.0–34.0)
MCHC: 32.8 g/dL (ref 30.0–36.0)
MCV: 98.3 fL (ref 80.0–100.0)
Platelets: 240 K/uL (ref 150–400)
RBC: 4.66 MIL/uL (ref 4.22–5.81)
RDW: 14.1 % (ref 11.5–15.5)
WBC: 9.7 K/uL (ref 4.0–10.5)
nRBC: 0 % (ref 0.0–0.2)

## 2024-08-17 LAB — LACTIC ACID, PLASMA
Lactic Acid, Venous: 6.7 mmol/L (ref 0.5–1.9)
Lactic Acid, Venous: 6.5 mmol/L (ref 0.5–1.9)
Lactic Acid, Venous: 6.7 mmol/L (ref 0.5–1.9)
Lactic Acid, Venous: 5.5 mmol/L (ref 0.5–1.9)

## 2024-08-17 LAB — BASIC METABOLIC PANEL WITH GFR
Anion gap: 18 — ABNORMAL HIGH (ref 5–15)
BUN: 17 mg/dL (ref 8–23)
CO2: 19 mmol/L — ABNORMAL LOW (ref 22–32)
Calcium: 8.9 mg/dL (ref 8.9–10.3)
Chloride: 101 mmol/L (ref 98–111)
Creatinine, Ser: 1.05 mg/dL (ref 0.61–1.24)
GFR, Estimated: >60 mL/min (ref >60)
Glucose, Bld: 210 mg/dL — ABNORMAL HIGH (ref 70–99)
Potassium: 4.2 mmol/L (ref 3.5–5.1)
Sodium: 138 mmol/L (ref 135–145)

## 2024-08-17 LAB — MRSA NEXT GEN BY PCR, NASAL: MRSA by PCR Next Gen: NOT DETECTED

## 2024-08-17 MED ORDER — EVOLOCUMAB 140 MG/ML ~~LOC~~ SOAJ
140.0000 mg | SUBCUTANEOUS | Status: DC
  Administered 2024-08-17: 140 mg via SUBCUTANEOUS

## 2024-08-17 MED ORDER — COLESTIPOL HCL 1 G PO TABS
2.0000 g | ORAL_TABLET | Freq: Two times a day (BID) | ORAL | Status: DC
  Administered 2024-08-17 – 2024-08-19 (×5): 2 g via ORAL
  Filled 2024-08-17 (×7): qty 2

## 2024-08-17 MED ORDER — CHLORHEXIDINE GLUCONATE CLOTH 2 % EX PADS
6.0000 | MEDICATED_PAD | Freq: Every day | CUTANEOUS | Status: DC
  Administered 2024-08-17 – 2024-08-19 (×3): 6 via TOPICAL

## 2024-08-17 MED ORDER — DIPHENOXYLATE-ATROPINE 2.5-0.025 MG PO TABS: 1.0000 | ORAL_TABLET | Freq: Four times a day (QID) | ORAL | Status: DC | PRN

## 2024-08-17 NOTE — ED Notes (Signed)
 Patient called out, c/o sob.  Patient tachypneic upon arrival to room.  Patient's lungs clear and equal bilaterally, Dr. Manfred made aware, advised to monitor closely.

## 2024-08-17 NOTE — Progress Notes (Signed)
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 regular water  to try and help increase his water  intake.    Expected Outcome Short: Try to incorporate healthy eating options. Long: Incorporate more water  into diet.          Psychosocial: Target Goals: Acknowledge presence or absence of significant depression and/or stress, maximize coping skills, provide positive support  system. Participant is able to verbalize types and ability to use techniques and skills needed for reducing stress and depression.  Initial Review & Psychosocial Screening:  Initial Psych Review & Screening - 07/09/24 1511       Initial Review   Current issues with None Identified      Family Dynamics   Good Support System? Yes    Comments Patient's wife supports him      Barriers   Psychosocial barriers to participate in program There are no identifiable barriers or psychosocial needs.      Screening Interventions   Interventions Encouraged to exercise;To provide support and  resources with identified psychosocial needs;Provide feedback about the scores to participant    Expected Outcomes Short Term goal: Identification and review with participant of any Quality of Life or Depression concerns found by scoring the questionnaire.;Long Term goal: The participant improves quality of Life and PHQ9 Scores as seen by post scores and/or verbalization of changes;Long Term Goal: Stressors or current issues are controlled or eliminated.;Short Term goal: Utilizing psychosocial counselor, staff and physician to assist with identification of specific Stressors or current issues interfering with healing process. Setting desired goal for each stressor or current issue identified.          Quality of Life Scores:  Scores of 19 and below usually indicate a poorer quality of life in these areas.  A difference of  2-3 points is a clinically meaningful difference.  A difference of 2-3 points in the total score of the Quality of Life Index has been associated with significant improvement in overall quality of life, self-image, physical symptoms, and general health in studies assessing change in quality of life.   PHQ-9: Review Flowsheet  More data exists      07/12/2024 06/01/2024 11/25/2023 10/07/2023 05/28/2023  Depression screen PHQ 2/9  Decreased Interest 0 0 0 0 0  Down, Depressed, Hopeless 0 0 0 0 0  PHQ - 2 Score 0 0 0 0 0  Altered sleeping 1 0 0 3 -  Tired, decreased energy 1 0 0 1 -  Change in appetite 2 0 0 3 -  Feeling bad or failure about yourself  0 0 0 0 -  Trouble concentrating 0 0 0 0 -  Moving slowly or fidgety/restless 0 0 0 0 -  Suicidal thoughts 0 0 0 0 -  PHQ-9 Score 4  0  0  7  -  Difficult doing work/chores Not difficult at all Not difficult at all Not difficult at all Not difficult at all -    Details       Data saved with a previous flowsheet row definition        Interpretation of Total Score  Total Score Depression Severity:  1-4 = Minimal  depression, 5-9 = Mild depression, 10-14 = Moderate depression, 15-19 = Moderately severe depression, 20-27 = Severe depression   Psychosocial Evaluation and Intervention:  Psychosocial Evaluation - 07/09/24 1511       Psychosocial Evaluation & Interventions   Interventions Encouraged to exercise with the program and follow exercise prescription;Stress management education;Relaxation education    Comments Patient referred to pulmonary rehab with COPD. He denies any depression or anxiety. He says he does not sleep well. He participated in cardiac rehab in 2020 with CABGx4. He lives with his wife.  His goals for the program is to be able to walk longer distances with less SOB. He has no barriers to complete the program.  Expected Outcomes Short Term: Patient will start the program and attend consistently. Long Term: Patient will complete the program meeting personal goals.    Continue Psychosocial Services  Follow up required by staff          Psychosocial Re-Evaluation:  Psychosocial Re-Evaluation     Row Name 08/04/24 1458             Psychosocial Re-Evaluation   Current issues with None Identified       Comments Sidney identifies no current stressors or sleep concerns. 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 He states that his sleep pattern is he goes to bed at 4am every night and wakes up about 11am.       Expected Outcomes Short: Continue to attend rehab. Long: Continue to have stress free outlets.       Interventions Encouraged to attend Pulmonary Rehabilitation for the exercise       Continue Psychosocial Services  Follow up required by staff          Psychosocial Discharge (Final Psychosocial Re-Evaluation):  Psychosocial Re-Evaluation - 08/04/24 1458       Psychosocial Re-Evaluation   Current issues with None Identified    Comments Freeman identifies no current stressors or sleep concerns. 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 He states that his sleep pattern is he goes to bed at 4am every night and wakes up about 11am.    Expected Outcomes Short: Continue to attend rehab. Long: Continue to have stress free outlets.    Interventions Encouraged to attend Pulmonary Rehabilitation for the exercise    Continue Psychosocial Services  Follow up required by staff           Education: Education Goals: Education classes will be provided on a weekly basis, covering required topics. Participant will state understanding/return demonstration of topics presented.  Learning Barriers/Preferences:  Learning Barriers/Preferences - 07/09/24 1508       Learning Barriers/Preferences   Learning Barriers None    Learning Preferences Audio;Computer/Internet;Group Instruction;Individual Instruction;Pictoral;Skilled Demonstration;Verbal Instruction;Written Material          Education Topics: How Lungs Work and Diseases: - Discuss the anatomy of the lungs and diseases that can affect the lungs, such as COPD.   Exercise: -Discuss the importance of exercise, FITT principles of exercise, normal and abnormal responses to exercise, and how to exercise safely.   Environmental Irritants: -Discuss types of environmental irritants and how to limit exposure to environmental irritants.   Meds/Inhalers and oxygen : - Discuss respiratory medications, definition of an inhaler and oxygen , and the proper way to use an inhaler and oxygen .   Energy Saving Techniques: - Discuss methods to conserve energy and decrease shortness of breath when performing activities of daily living.    Bronchial Hygiene / Breathing Techniques: - Discuss breathing mechanics, pursed-lip breathing technique,  proper posture, effective ways to clear airways, and other functional breathing techniques   Cleaning Equipment: - Provides group verbal and written instruction about the health risks of elevated  stress, cause of high stress, and healthy ways to reduce stress.   Nutrition I: Fats: - Discuss the types of cholesterol, what cholesterol does to the body, and how cholesterol levels can be controlled.   Nutrition II: Labels: -Discuss the different components of food labels and how to read food labels. Flowsheet Row CARDIAC REHAB PHASE II EXERCISE from 09/15/2019 in  [place] [facility]  Date 08/18/19  Educator DC  Instruction Review Code 2- Demonstrated Understanding    Respiratory Infections: - Discuss the signs and symptoms of respiratory infections, ways to prevent respiratory infections, and the importance of seeking medical treatment when having a respiratory infection.   Stress I: Signs and Symptoms: - Discuss the causes of stress, how stress may lead to anxiety and depression, and ways to limit stress.   Stress II: Relaxation: -Discuss relaxation techniques to limit stress.   Oxygen  for Home/Travel: - Discuss how to prepare for travel when on oxygen  and proper ways to transport and store oxygen  to ensure safety.   Knowledge Questionnaire Score:  Knowledge Questionnaire Score - 07/12/24 1452       Knowledge Questionnaire Score   Pre Score 9/18          Core Components/Risk Factors/Patient Goals at Admission:  Personal Goals and Risk Factors at Admission - 07/09/24 1510       Core Components/Risk Factors/Patient Goals on Admission    Weight Management Weight Maintenance    Improve shortness of breath with ADL's Yes    Intervention Provide education, individualized exercise plan and daily activity instruction to help decrease symptoms of SOB with activities of daily living.    Expected Outcomes Short Term: Improve cardiorespiratory fitness to achieve a reduction of symptoms when performing ADLs;Long Term: Be able to perform more ADLs without symptoms or delay the onset of symptoms    Increase knowledge of respiratory medications and ability to use  respiratory devices properly  Yes    Intervention Provide education and demonstration as needed of appropriate use of medications, inhalers, and oxygen  therapy.    Expected Outcomes Short Term: Achieves understanding of medications use. Understands that oxygen  is a medication prescribed by physician. Demonstrates appropriate use of inhaler and oxygen  therapy.;Long Term: Maintain appropriate use of medications, inhalers, and oxygen  therapy.    Heart Failure Yes    Intervention Provide a combined exercise and nutrition program that is supplemented with education, support and counseling about heart failure. Directed toward relieving symptoms such as shortness of breath, decreased exercise tolerance, and extremity edema.    Expected Outcomes Improve functional capacity of life;Short term: Attendance in program 2-3 days a week with increased exercise capacity. Reported lower sodium intake. Reported increased fruit and vegetable intake. Reports medication compliance.;Short term: Daily weights obtained and reported for increase. Utilizing diuretic protocols set by physician.;Long term: Adoption of self-care skills and reduction of barriers for early signs and symptoms recognition and intervention leading to self-care maintenance.    Hypertension Yes    Intervention Provide education on lifestyle modifcations including regular physical activity/exercise, weight management, moderate sodium restriction and increased consumption of fresh fruit, vegetables, and low fat dairy, alcohol moderation, and smoking cessation.;Monitor prescription use compliance.    Expected Outcomes Short Term: Continued assessment and intervention until BP is < 140/91mm HG in hypertensive participants. < 130/31mm HG in hypertensive participants with diabetes, heart failure or chronic kidney disease.;Long Term: Maintenance of blood pressure at goal levels.    Lipids Yes    Intervention Provide education and support for participant on  nutrition & aerobic/resistive exercise along with prescribed medications to achieve LDL 70mg , HDL >40mg .    Expected Outcomes Short Term: Participant states understanding of desired cholesterol values and is compliant with medications prescribed. Participant is following exercise prescription and nutrition guidelines.;Long Term: Cholesterol controlled with medications as prescribed, with individualized exercise RX and with personalized nutrition plan. Value goals:  LDL < 70mg , HDL > 40 mg.          Core Components/Risk Factors/Patient Goals Review:   Goals and Risk Factor Review     Row Name 08/04/24 1511             Core Components/Risk Factors/Patient Goals Review   Personal Goals Review Weight Management/Obesity;Improve shortness of breath with ADL's;Heart Failure;Lipids       Review Imran states that he takes all his medicines as prescribed. He checks his BP whenever he feels that something is wrong. He checks his O2 sats whenever his wife tells him to.       Expected Outcomes Short: Continue to attend rehab. Long: Continue checking vitals at home and report any abnormalities.          Core Components/Risk Factors/Patient Goals at Discharge (Final Review):   Goals and Risk Factor Review - 08/04/24 1511       Core Components/Risk Factors/Patient Goals Review   Personal Goals Review Weight Management/Obesity;Improve shortness of breath with ADL's;Heart Failure;Lipids    Review Jermery states that he takes all his medicines as prescribed. He checks his BP whenever he feels that something is wrong. He checks his O2 sats whenever his wife tells him to.    Expected Outcomes Short: Continue to attend rehab. Long: Continue checking vitals at home and report any abnormalities.          ITP Comments:  ITP Comments     Row Name 07/09/24 1514 07/12/24 1456 07/19/24 1531 07/21/24 0831 08/17/24 1021   ITP Comments Virtual orientation visit completed for pulmonary rehab with COPD. On-site  orientation visit scheduled for 07/12/24 at 2:15. Patient arrived for 1st visit/orientation/education at 1400. Patient was referred to PR by Dr. Sammi Fredericks due to COPD. During orientation advised patient on arrival and appointment times what to wear, what to do before, during and after exercise. Reviewed attendance and class policy.  Pt is scheduled to return Pulmonary Rehab on 07/19/24 at 1430. Pt was advised to come to class 15 minutes before class starts.  Discussed RPE/Dpysnea scales. Patient participated in warm up stretches. Patient was able to complete 6 minute walk test. Patient was measured for the equipment. Discussed equipment safety with patient. Took patient pre-anthropometric measurements. Patient finished visit at 1310. First full day of exercise!  Patient was oriented to gym and equipment including functions, settings, policies, and procedures.  Patient's individual exercise prescription and treatment plan were reviewed.  All starting workloads were established based on the results of the 6 minute walk test done at initial orientation visit.  The plan for exercise progression was also introduced and progression will be customized based on patient's performance and goal 30 day review completed. ITP sent to Dr.Jehanzeb Memon, Medical Director of  Pulmonary Rehab. Continue with ITP unless changes are made by physician.  New to the program. 30 day review completed. ITP sent to Dr.Jehanzeb Memon, Medical Director of  Pulmonary Rehab. Continue with ITP unless changes are made by physician.  Currently admitted for sepsis.      Comments: 30 day review

## 2024-08-17 NOTE — TOC CM/SW Note (Signed)
 Transition of Care [facility]) - Inpatient Brief Assessment   Patient Details  Name: Benjamin [name] MRN: [id number] Date of Birth: 07-17-1941  Transition of Care [facility]) CM/SW Contact:    Lucie Lunger, LCSWA Phone Number: 08/17/2024, 8:55 AM   Clinical Narrative: Transition of Care Department [facility]) has reviewed patient and no TOC needs have been identified at this time. We will continue to monitor patient advancement through interdiciplinary progression rounds. If new patient transition needs arise, please place a TOC consult.  Transition of Care Asessment: Insurance and Status: Insurance coverage has been reviewed Patient has primary care physician: Yes Home environment has been reviewed: From home Prior level of function:: Independent Prior/Current Home Services: No current home services Social Drivers of Health Review: SDOH reviewed no interventions necessary Readmission risk has been reviewed: Yes Transition of care needs: no transition of care needs at this time

## 2024-08-17 NOTE — ED Notes (Signed)
 Pt requested dosing time to be changed for nasal spray to stay on home routine. Pharm called and changed time

## 2024-08-17 NOTE — Progress Notes (Signed)
 Progress Note   Patient: Benjamin [name] FMW:[id number] DOB: 07/01/1941 DOA: 08/16/2024     1 DOS: the patient was seen and examined on 08/17/2024   Brief hospital course: Emelio Schneller is a 83 y.o. male with medical history significant of BPH, diastolic congestive heart failure, coronary artery disease status post CABG, COPD, degenerative disc disease, GERD, hyperlipidemia, prediabetes who was otherwise well until last night when he started having worsening shortness of breath.  He has some associated left-sided chest pain that is pleuritic, denies dizziness, nausea vomiting abdominal pain or urinary complaints.  According to patient although he has some oxygen  at home he does not need it every day.   Patient is found to have chest x-ray finding of multifocal infiltrate, requiring 2 L supplemental oxygen , did not meet sepsis criteria with lactic acid greater than 6, admitted to hospitalist service for further management evaluation.  Assessment and Plan: Severe sepsis secondary to community-acquired pneumonia likely bacterial Patient met SIRS criteria in the setting of tachycardia and leukocytosis 15.3 with a lactic acid of >6.  Patient will be transferred to stepdown unit given elevated lactic acid level. Viral panel came back negative. Chest x-ray showing multifocal pneumonia. Continue ceftriaxone  and azithromycin . Monitor oxygen  requirement and wean as tolerated Follow-up on culture results as well as Legionella and mycoplasma.   BPH Continue tamsulosin  and finasteride .   COPD Not in acute exacerbation. Continue as needed nebulization.  Resumed home inhalers   Chronic diastolic congestive heart failure Coronary artery disease status post CABG Monitor daily weight as well as input and output Continue to hold antihypertensives at this time due to low blood pressures.   Degenerative disc disease Continue home tramadol    GERD Continue pantoprazole    Hyperlipidemia He is on Repatha , wife  brought the medication to be administered today.   Prediabetes A1c obtained September 2025 was 6.0 Monitor glucose levels Refused diabetic diet, started regular diet. Encouraged to watch carbohydrate intake.  Diabetes education provided.       Out of bed to chair. Incentive spirometry. Nursing supportive care. Fall, aspiration precautions. Diet:  Diet Orders (From admission, onward)     Start     Ordered   08/17/24 1231  Diet regular Room service appropriate? Yes; Fluid consistency: Thin  Diet effective now       Question Answer Comment  Room service appropriate? Yes   Fluid consistency: Thin      08/17/24 1231           DVT prophylaxis: enoxaparin  (LOVENOX ) injection 40 mg Start: 08/16/24 2200  Level of care: Stepdown   Code Status: Full Code  Subjective: Patient is seen and examined today morning.  He is lying comfortably, wished to go home leaving AMA.  I discussed with him the need for hospital stay, current management.  He agreed to stay.  Asked about home medications.  Physical Exam: Vitals:   08/17/24 1143 08/17/24 1200 08/17/24 1402 08/17/24 1403  BP:  139/75  (!) 150/86  Pulse:  94 83 81  Resp:  (!) 34    Temp: 97.6 F (36.4 C)     TempSrc: Oral     SpO2:  94% 96% 95%  Weight:      Height:        General - Elderly Caucasian male, no apparent distress HEENT - PERRLA, EOMI, atraumatic head, non tender sinuses. Lung - Clear, basal rales, rhonchi, no wheezes. Heart - S1, S2 heard, no murmurs, rubs, trace pedal edema. Abdomen - Soft, non  tender, bowel sounds heard Neuro - Alert, awake and oriented x 3, non focal exam. Skin - Warm and dry.  Data Reviewed:      Latest Ref Rng & Units 08/17/2024    4:03 AM 08/16/2024    3:51 PM 06/03/2024    4:12 PM  CBC  WBC 4.0 - 10.5 K/uL 9.7  15.3  11.1   Hemoglobin 13.0 - 17.0 g/dL 84.9  83.1  83.4   Hematocrit 39.0 - 52.0 % 45.8  50.1  48.6   Platelets 150 - 400 K/uL 240  271  226       Latest Ref Rng &  Units 08/17/2024    4:03 AM 08/16/2024    3:51 PM 06/03/2024    4:12 PM  BMP  Glucose 70 - 99 mg/dL 789  879  894   BUN 8 - 23 mg/dL 17  18  18    Creatinine 0.61 - 1.24 mg/dL 8.94  8.92  8.85   Sodium 135 - 145 mmol/L 138  139  143   Potassium 3.5 - 5.1 mmol/L 4.2  4.3  3.8   Chloride 98 - 111 mmol/L 101  101  109   CO2 22 - 32 mmol/L 19  24  21    Calcium  8.9 - 10.3 mg/dL 8.9  9.2  8.8    DG Chest 2 View Result Date: 08/16/2024 CLINICAL DATA:  Shortness of breath EXAM: CHEST - 2 VIEW COMPARISON:  Chest x-ray 06/03/2024 FINDINGS: There are patchy airspace opacities in the right mid lung N inferior left lower lobe. No definite pleural effusion or pneumothorax. Patient is status post cardiac surgery. Cardiomediastinal silhouette is stable. No acute fractures are seen. IMPRESSION: Patchy airspace opacities in the right mid lung and inferior left lower lobe, concerning for multifocal pneumonia. Follow-up imaging recommended to confirm complete resolution. Electronically Signed   By: Greig Pique M.D.   On: 08/16/2024 15:39    Family Communication: Discussed with patient, understand and agree. All questions answered.  Disposition: Status is: Inpatient Remains inpatient appropriate because: IV antibiotics for pneumonia, monitor lactic acid  Planned Discharge Destination: Home with Home Health     Time spent: 51 minutes  Author: Concepcion Riser, MD 08/17/2024 3:39 PM Secure chat 7am to 7pm For on call review [email or web address].

## 2024-08-18 ENCOUNTER — Encounter (HOSPITAL_COMMUNITY)

## 2024-08-18 DIAGNOSIS — I5032 Chronic diastolic (congestive) heart failure: Secondary | ICD-10-CM | POA: Diagnosis not present

## 2024-08-18 DIAGNOSIS — R7989 Other specified abnormal findings of blood chemistry: Secondary | ICD-10-CM | POA: Diagnosis not present

## 2024-08-18 DIAGNOSIS — A419 Sepsis, unspecified organism: Secondary | ICD-10-CM | POA: Diagnosis not present

## 2024-08-18 DIAGNOSIS — J42 Unspecified chronic bronchitis: Secondary | ICD-10-CM

## 2024-08-18 DIAGNOSIS — J189 Pneumonia, unspecified organism: Secondary | ICD-10-CM | POA: Diagnosis not present

## 2024-08-18 LAB — LACTIC ACID, PLASMA: Lactic Acid, Venous: 2.5 mmol/L (ref 0.5–1.9)

## 2024-08-18 MED ORDER — CRANBERRY 400 MG PO TABS: 2.0000 | ORAL_TABLET | Freq: Two times a day (BID) | ORAL | Status: DC

## 2024-08-18 MED ORDER — FLUTICASONE-UMECLIDIN-VILANT 200-62.5-25 MCG/ACT IN AEPB
1.0000 | INHALATION_SPRAY | Freq: Every day | RESPIRATORY_TRACT | Status: DC
  Administered 2024-08-18 – 2024-08-19 (×2): 1 via RESPIRATORY_TRACT

## 2024-08-18 MED ORDER — IPRATROPIUM-ALBUTEROL 0.5-2.5 (3) MG/3ML IN SOLN: 3.0000 mL | Freq: Two times a day (BID) | RESPIRATORY_TRACT | Status: DC

## 2024-08-18 NOTE — Evaluation (Signed)
 Occupational Therapy Evaluation Patient Details Name: Benjamin [name] MRN: [id number] DOB: 07/05/1941 Today's Date: 08/18/2024   History of Present Illness   Benjamin [name] is a 83 y.o. male with medical history significant of BPH, diastolic congestive heart failure, coronary artery disease status post CABG, COPD, degenerative disc disease, GERD, hyperlipidemia, prediabetes who was otherwise well until last night when he started having worsening shortness of breath.  He has some associated left-sided chest pain that is pleuritic, denies dizziness, nausea vomiting abdominal pain or urinary complaints.  According to patient although he has some oxygen  at home he does not need it every day.     Clinical Impressions Pt agreeable to OT/PT evaluation. Pt reports that prior to hospitalization he was independent with ADLS. He reports that he has frequent falls to do his equilibrium being off. His wife lives at home with him and can provide light assist if needed. During evaluation, pt completed bed mobility and sit to stand transfers with stand by assist. He was able to walk around unit with SBA and use of RW. Pt does not require any further OT services.      If plan is discharge home, recommend the following:   A little help with walking and/or transfers;A little help with bathing/dressing/bathroom     Functional Status Assessment   Patient has had a recent decline in their functional status and demonstrates the ability to make significant improvements in function in a reasonable and predictable amount of time.     Equipment Recommendations   None recommended by OT      Precautions/Restrictions   Precautions Precautions: Fall Recall of Precautions/Restrictions: Intact Restrictions Weight Bearing Restrictions Per Provider Order: No     Mobility Bed Mobility Overal bed mobility: Needs Assistance Bed Mobility: Supine to Sit, Sit to Supine     Supine to sit: Modified independent  (Device/Increase time) Sit to supine: Modified independent (Device/Increase time)   General bed mobility comments: labored w/ increase time    Transfers Overall transfer level: Needs assistance Equipment used: Straight cane Transfers: Sit to/from Stand, Bed to chair/wheelchair/BSC Sit to Stand: Supervision, Modified independent (Device/Increase time)     Step pivot transfers: Modified independent (Device/Increase time), Supervision     General transfer comment: w/ SP cane      Balance Overall balance assessment: Modified Independent                                         ADL either performed or assessed with clinical judgement   ADL Overall ADL's : Modified independent;At baseline                                       General ADL Comments: pt completing ADLS seated due to poor balance. At baseline     Vision Baseline Vision/History: 0 No visual deficits Ability to See in Adequate Light: 0 Adequate Patient Visual Report: No change from baseline Vision Assessment?: No apparent visual deficits     Perception         Praxis         Pertinent Vitals/Pain Pain Assessment Pain Assessment: No/denies pain     Extremity/Trunk Assessment Upper Extremity Assessment Upper Extremity Assessment: Overall WFL for tasks assessed   Lower Extremity Assessment Lower Extremity Assessment: Defer to PT evaluation  Cervical / Trunk Assessment Cervical / Trunk Assessment: Normal   Communication Communication Communication: No apparent difficulties   Cognition Arousal: Alert Behavior During Therapy: WFL for tasks assessed/performed Cognition: No apparent impairments                               Following commands: Intact       Cueing  General Comments   Cueing Techniques: Verbal cues      Exercises     Shoulder Instructions      Home Living Family/patient expects to be discharged to:: Private residence Living  Arrangements: Spouse/significant other Available Help at Discharge: Family Type of Home: House Home Access: Stairs to enter Secretary/administrator of Steps: 5 Entrance Stairs-Rails: Right;Left;Can reach both Home Layout: One level     Bathroom Shower/Tub: Producer, Television/film/video: Standard Bathroom Accessibility: Yes How Accessible: Accessible via walker Home Equipment: Rolling Walker (2 wheels);Cane - single point;Shower seat;BSC/3in1;Grab bars - toilet;Grab bars - tub/shower          Prior Functioning/Environment Prior Level of Function : Independent/Modified Independent             Mobility Comments: household and community ambulator w/ SP cane ADLs Comments: assisted when needed by wife at home    OT Problem List: Impaired balance (sitting and/or standing)   OT Treatment/Interventions:        OT Goals(Current goals can be found in the care plan section)   Acute Rehab OT Goals Patient Stated Goal: to return home OT Goal Formulation: With patient   OT Frequency:       Co-evaluation PT/OT/SLP Co-Evaluation/Treatment: Yes Reason for Co-Treatment: To address functional/ADL transfers   OT goals addressed during session: ADL's and self-care      AM-PAC OT 6 Clicks Daily Activity     Outcome Measure Help from another person eating meals?: None Help from another person taking care of personal grooming?: None Help from another person toileting, which includes using toliet, bedpan, or urinal?: A Little Help from another person bathing (including washing, rinsing, drying)?: A Little Help from another person to put on and taking off regular upper body clothing?: None Help from another person to put on and taking off regular lower body clothing?: None 6 Click Score: 22   End of Session Nurse Communication: Mobility status  Activity Tolerance: Patient tolerated treatment well Patient left: in chair;with family/visitor present  OT Visit Diagnosis:  Repeated falls (R29.6)                Time: 8582-8570 OT Time Calculation (min): 12 min Charges:  OT General Charges $OT Visit: 1 Visit OT Evaluation $OT Eval Low Complexity: 1 Low   Chiquita LOISE Sermon, OTR/L 08/18/2024, 3:20 PM

## 2024-08-18 NOTE — Evaluation (Signed)
 Physical Therapy Evaluation Patient Details Name: Amier Hoyt MRN: [id number] DOB: October 01, 1940 Today's Date: 08/18/2024  History of Present Illness  Kayvan Hoefling is a 83 y.o. male with medical history significant of BPH, diastolic congestive heart failure, coronary artery disease status post CABG, COPD, degenerative disc disease, GERD, hyperlipidemia, prediabetes who was otherwise well until last night when he started having worsening shortness of breath.  He has some associated left-sided chest pain that is pleuritic, denies dizziness, nausea vomiting abdominal pain or urinary complaints.  According to patient although he has some oxygen  at home he does not need it every day.    Clinical Impression  Pt. Presented w/ mild general weakness in LE and L. Knee weakness. Co-treated w/OT. Pt tolerated session well today. Pt was able to perform bed mobility w/ mod Independence/ supervision due to increase to to EOB. Pt was able to perform tranfers from sit to stand w/ SP cane, when standing pt. mentioned feeling unsteady due to L. Knee weakness. Pt. Was bale to ambulate into hallway around nursing station, pt. Mentioned being SOB  towards the end of treatment and SpO2 decreased to 88%, with rest SpO2 remained at >97% on room air. Pt was left in chair w/ call bell and and wife at bedside. Nursing staff was notified on pt status. Patient will benefit from continued skilled physical therapy in hospital and recommended venue below to increase strength, balance, endurance for safe ADLs and gait.       If plan is discharge home, recommend the following: A little help with walking and/or transfers;Assist for transportation;Assistance with cooking/housework;Help with stairs or ramp for entrance   Can travel by private vehicle    Yes    Equipment Recommendations None recommended by PT  Recommendations for Other Services       Functional Status Assessment Patient has had a recent decline in their functional  status and demonstrates the ability to make significant improvements in function in a reasonable and predictable amount of time.     Precautions / Restrictions Precautions Precautions: Fall Recall of Precautions/Restrictions: Intact Restrictions Weight Bearing Restrictions Per Provider Order: No      Mobility  Bed Mobility Overal bed mobility: Needs Assistance Bed Mobility: Supine to Sit, Sit to Supine     Supine to sit: Modified independent (Device/Increase time) Sit to supine: Modified independent (Device/Increase time)   General bed mobility comments: labored w/ increase time    Transfers Overall transfer level: Needs assistance Equipment used: Straight cane Transfers: Sit to/from Stand, Bed to chair/wheelchair/BSC Sit to Stand: Supervision, Modified independent (Device/Increase time)   Step pivot transfers: Modified independent (Device/Increase time), Supervision       General transfer comment: w/ SP cane    Ambulation/Gait Ambulation/Gait assistance: Supervision, Contact guard assist Gait Distance (Feet): 95 Feet Assistive device: Straight cane Gait Pattern/deviations: Step-to pattern, Decreased step length - right, Decreased step length - left, Decreased stance time - right, Decreased stance time - left       General Gait Details: pt. was bale to ambulate w/ SP cane into hallway, pt. mentioned feeling unsteady due to L. knee weakness  Stairs            Wheelchair Mobility     Tilt Bed    Modified Rankin (Stroke Patients Only)       Balance Overall balance assessment: Modified Independent  Pertinent Vitals/Pain Pain Assessment Pain Assessment: No/denies pain    Home Living Family/patient expects to be discharged to:: Private residence Living Arrangements: Spouse/significant other   Type of Home: House Home Access: Stairs to enter Entrance Stairs-Rails: Right;Left;Can reach  both Entrance Stairs-Number of Steps: 5   Home Layout: One level Home Equipment: Agricultural Consultant (2 wheels);Cane - single point;Shower seat;BSC/3in1;Grab bars - toilet;Grab bars - tub/shower      Prior Function Prior Level of Function : Independent/Modified Independent             Mobility Comments: household and community ambulator w/ SP cane ADLs Comments: assisted when needed by wife at home     Extremity/Trunk Assessment   Upper Extremity Assessment Upper Extremity Assessment: Defer to OT evaluation    Lower Extremity Assessment Lower Extremity Assessment: Generalized weakness    Cervical / Trunk Assessment Cervical / Trunk Assessment: Normal  Communication   Communication Communication: No apparent difficulties    Cognition Arousal: Alert Behavior During Therapy: WFL for tasks assessed/performed   PT - Cognitive impairments: No apparent impairments                         Following commands: Intact       Cueing Cueing Techniques: Verbal cues     General Comments      Exercises     Assessment/Plan    PT Assessment Patient needs continued PT services  PT Problem List Decreased strength;Decreased range of motion;Decreased activity tolerance;Decreased safety awareness;Decreased balance;Decreased mobility;Decreased coordination       PT Treatment Interventions DME instruction;Gait training;Stair training;Functional mobility training;Therapeutic activities;Therapeutic exercise;Balance training;Patient/family education    PT Goals (Current goals can be found in the Care Plan section)  Acute Rehab PT Goals Patient Stated Goal: return home PT Goal Formulation: With patient/family Time For Goal Achievement: 08/24/24 Potential to Achieve Goals: Good    Frequency Min 3X/week     Co-evaluation               AM-PAC PT 6 Clicks Mobility  Outcome Measure Help needed turning from your back to your side while in a flat bed without using  bedrails?: None Help needed moving from lying on your back to sitting on the side of a flat bed without using bedrails?: None Help needed moving to and from a bed to a chair (including a wheelchair)?: None Help needed standing up from a chair using your arms (e.g., wheelchair or bedside chair)?: A Little Help needed to walk in hospital room?: A Little Help needed climbing 3-5 steps with a railing? : A Lot 6 Click Score: 20    End of Session   Activity Tolerance: Patient tolerated treatment well;Patient limited by fatigue Patient left: in chair;with family/visitor present;with call bell/phone within reach Nurse Communication: Mobility status PT Visit Diagnosis: Unsteadiness on feet (R26.81);Muscle weakness (generalized) (M62.81);Repeated falls (R29.6)    Time: 8598-8571 PT Time Calculation (min) (ACUTE ONLY): 27 min   Charges:   PT Evaluation $PT Eval Moderate Complexity: 1 Mod PT Treatments $Therapeutic Activity: 23-37 mins PT General Charges $$ ACUTE PT VISIT: 1 Visit        Tibor Lemmons, SPT

## 2024-08-18 NOTE — Progress Notes (Signed)
PHARMACIST - PHYSICIAN ORDER COMMUNICATION  CONCERNING: P&T Medication Policy on Herbal Medications  DESCRIPTION:  This patient's order for:  Cranberry  has been noted.  This product(s) is classified as an "herbal" or natural product. Due to a lack of definitive safety studies or FDA approval, nonstandard manufacturing practices, plus the potential risk of unknown drug-drug interactions while on inpatient medications, the Pharmacy and Therapeutics Committee does not permit the use of "herbal" or natural products of this type within [place].   ACTION TAKEN: The pharmacy department is unable to verify this order at this time and your patient has been informed of this safety policy. Please reevaluate patient's clinical condition at discharge and address if the herbal or natural product(s) should be resumed at that time.   Creed Kail, PharmD 

## 2024-08-18 NOTE — TOC Initial Note (Signed)
 Transition of Care [facility]) - Initial/Assessment Note    Patient Details  Name: Benjamin [name] MRN: [id number] Date of Birth: Jan 17, 1941  [facility] CM/SW Contact:    Lucie Lunger, LCSWA Phone Number: 08/18/2024, 3:06 PM  Clinical Narrative:                 CSW updated that PT is recommending OP PT for pt at D/C. CSW spoke with pt at bedside to complete assessment. Pt states that he lives with his wife. Pt is independent in completing his ADLs and drives when needed. Pt states he has a cane and walker to use when needed. Pt states he is agreeable to OP PT being arranged at the [facility]. Referral sent for OP PT. TOC to follow.   Expected Discharge Plan: OP Rehab Barriers to Discharge: Continued Medical Work up   Patient Goals and CMS Choice Patient states their goals for this hospitalization and ongoing recovery are:: return home CMS Medicare.gov Compare Post Acute Care list provided to:: Patient Choice offered to / list presented to : Patient      Expected Discharge Plan and Services In-house Referral: Clinical Social Work Discharge Planning Services: CM Consult Post Acute Care Choice: Resumption of Svcs/PTA Provider Living arrangements for the past 2 months: Single Family Home                                      Prior Living Arrangements/Services Living arrangements for the past 2 months: Single Family Home Lives with:: Spouse Patient language and need for interpreter reviewed:: Yes Do you feel safe going back to the place where you live?: Yes      Need for Family Participation in Patient Care: Yes (Comment) Care giver support system in place?: Yes (comment) Current home services: DME Criminal Activity/Legal Involvement Pertinent to Current Situation/Hospitalization: No - Comment as needed  Activities of Daily Living   ADL Screening (condition at time of admission) Independently performs ADLs?: Yes (appropriate for developmental age) Is the  patient deaf or have difficulty hearing?: Yes Does the patient have difficulty seeing, even when wearing glasses/contacts?: No Does the patient have difficulty concentrating, remembering, or making decisions?: No  Permission Sought/Granted                  Emotional Assessment Appearance:: Appears stated age Attitude/Demeanor/Rapport: Engaged Affect (typically observed): Accepting Orientation: : Oriented to Self, Oriented to Place, Oriented to  Time, Oriented to Situation Alcohol / Substance Use: Not Applicable Psych Involvement: No (comment)  Admission diagnosis:  Community acquired pneumonia [J18.9] Severe sepsis (HCC) [A41.9, R65.20] Sepsis due to pneumonia (HCC) [J18.9, A41.9] Patient Active Problem List   Diagnosis Date Noted   Sepsis due to pneumonia (HCC) 08/17/2024   Community acquired pneumonia 08/16/2024   Gait disturbance 06/04/2024   Hypokalemia 06/01/2024   Seasonal and perennial allergic rhinitis 07/04/2023   Leg cramps 05/28/2023   Chronic sinusitis 01/31/2023   Bronchiectasis 01/31/2023   Chronic respiratory failure with hypoxia (HCC) 12/31/2022   Encounter for general adult medical examination with abnormal findings 11/22/2022   Bilateral impacted cerumen 05/22/2022   Hospital discharge follow-up 10/30/2021   History of adenomatous polyp of colon 10/30/2021   History of duodenal ulcer 10/30/2021   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   Grade II hemorrhoids 10/23/2021   Rectal bleeding 10/23/2021   Hyperlipidemia LDL goal <70 10/18/2021  Muscle weakness (generalized) 10/16/2021   Right inguinal hernia    Hernia, umbilical 04/13/2021   Thrombocytopenia 01/23/2020   Chronic allergic rhinitis 12/21/2019   Chronic pain syndrome 12/21/2019   Chronic insomnia 12/20/2019   AK (actinic keratosis) 03/17/2019   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   CAD (coronary artery disease) 02/05/2019   Aortic atherosclerosis 09/22/2018   Prediabetes  09/22/2018   Chronic diastolic CHF (congestive heart failure) (HCC)    Irritable bowel syndrome with diarrhea    Chronic bilateral thoracic back pain 02/12/2017   GERD (gastroesophageal reflux disease) 11/27/2016   Constipation 10/28/2016   Erectile dysfunction 06/25/2016   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   DDD (degenerative disc disease), lumbar 01/11/2014   BPH (benign prostatic hyperplasia) 03/09/2012   PCP:  Tobie Suzzane POUR, MD Pharmacy:   [facility] [address], [place] - [address] [place] [address] Phone: [phone] Fax: [phone]  [facility] - [place], [place] - [address] [facility] AT Portal to Reg[facility] [address] [place] [address] Phone: [phone] Fax: [phone]     Social Drivers of Health (SDOH) Social History: SDOH Screenings   Food Insecurity: No Food Insecurity (08/17/2024)  Housing: Low Risk  (08/17/2024)  Transportation Needs: No Transportation Needs (08/17/2024)  Utilities: Not At Risk (08/17/2024)  Alcohol Screen: Low Risk  (10/07/2023)  Depression (PHQ2-9): Low Risk  (07/12/2024)  Financial Resource Strain: Low Risk  (10/07/2023)  Physical Activity: Inactive (10/07/2023)  Social Connections: Socially Integrated (08/17/2024)  Stress: No Stress Concern Present (10/07/2023)  Tobacco Use: Medium Risk (08/16/2024)  Health Literacy: Adequate Health Literacy (10/07/2023)   SDOH Interventions:     Readmission Risk Interventions    08/18/2024    3:05 PM  Readmission Risk Prevention Plan  Medication Screening Complete  Transportation Screening Complete

## 2024-08-18 NOTE — Progress Notes (Signed)
 Patient stated that he was having difficulty breathing and requested his PRN nebulizer. Respirations are regular and unlabored. Patient does not appear to be in distress. Lungs sounds clear bilaterally in all lobes. Respiratory rate 21 and SpO2 98% on 3LNC. RT at bedside.

## 2024-08-18 NOTE — Progress Notes (Signed)
 Progress Note   Patient: Benjamin [name] FMW:[id number] DOB: Oct 18, 1940 DOA: 08/16/2024     2 DOS: the patient was seen and examined on 08/18/2024   Brief hospital course: Benjamin [name] is a 83 y.o. male with medical history significant of BPH, diastolic congestive heart failure, coronary artery disease status post CABG, COPD, degenerative disc disease, GERD, hyperlipidemia, prediabetes who was otherwise well until last night when he started having worsening shortness of breath.  He has some associated left-sided chest pain that is pleuritic, denies dizziness, nausea vomiting abdominal pain or urinary complaints.  According to patient although he has some oxygen  at home he does not need it every day.   Patient is found to have chest x-ray finding of multifocal infiltrate, requiring 2 L supplemental oxygen , did not meet sepsis criteria with lactic acid greater than 6, admitted to hospitalist service for further management evaluation.  Assessment and Plan: Severe sepsis secondary to community-acquired pneumonia likely bacterial Patient met SIRS criteria in the setting of tachycardia and leukocytosis 15.3 with a lactic acid of >6.  Continue to monitor closely in step down. Lactic acid down to 2.5. Viral panel came back negative. Chest x-ray showing multifocal pneumonia. Blood cultures negative, MRSA screen negative. Continue ceftriaxone  and azithromycin . Monitor oxygen  requirement and wean as tolerated   BPH Continue tamsulosin  and finasteride .   COPD Continue as needed nebulization.  Resumed home inhalers, he wants to use his trelegy from home order placed.   Chronic diastolic congestive heart failure Coronary artery disease status post CABG Monitor daily weight as well as input and output Continue to hold antihypertensives at this time due to low blood pressures.   Degenerative disc disease Continue home tramadol    GERD Continue pantoprazole    Hyperlipidemia He is on Repatha , wife brought  the medication, administered 08/16/24.   Prediabetes A1c 6.0 Monitor glucose levels Refused diabetic diet, started regular diet. Encouraged to watch carbohydrate intake.  Diabetes education provided.      PT/ OT eval for dc plan. Out of bed to chair. Incentive spirometry. Nursing supportive care. Fall, aspiration precautions. Diet:  Diet Orders (From admission, onward)     Start     Ordered   08/17/24 1231  Diet regular Room service appropriate? Yes; Fluid consistency: Thin  Diet effective now       Question Answer Comment  Room service appropriate? Yes   Fluid consistency: Thin      08/17/24 1231           DVT prophylaxis: enoxaparin  (LOVENOX ) injection 40 mg Start: 08/16/24 2200  Level of care: Stepdown   Code Status: Full Code  Subjective: Patient is seen and examined today morning.  He is lying comfortably, feels better today. On 2L supplemental o2 which I removed he is saturating 94%. Encouraged incentive spirometry.  Physical Exam: Vitals:   08/18/24 0800 08/18/24 0900 08/18/24 0938 08/18/24 1000  BP: 123/69 120/82  136/72  Pulse: 63 84  92  Resp: 13 (!) 22  (!) 21  Temp:      TempSrc:      SpO2: 97% 95% 93% 93%  Weight:      Height:        General - Elderly Caucasian male, no apparent distress HEENT - PERRLA, EOMI, atraumatic head, non tender sinuses. Lung - Clear, basal rales, rhonchi, no wheezes. Heart - S1, S2 heard, no murmurs, rubs, trace pedal edema. Abdomen - Soft, non tender, bowel sounds heard Neuro - Alert, awake and oriented x 3, non  focal exam. Skin - Warm and dry.  Data Reviewed:      Latest Ref Rng & Units 08/17/2024    4:03 AM 08/16/2024    3:51 PM 06/03/2024    4:12 PM  CBC  WBC 4.0 - 10.5 K/uL 9.7  15.3  11.1   Hemoglobin 13.0 - 17.0 g/dL 84.9  83.1  83.4   Hematocrit 39.0 - 52.0 % 45.8  50.1  48.6   Platelets 150 - 400 K/uL 240  271  226       Latest Ref Rng & Units 08/17/2024    4:03 AM 08/16/2024    3:51 PM 06/03/2024     4:12 PM  BMP  Glucose 70 - 99 mg/dL 789  879  894   BUN 8 - 23 mg/dL 17  18  18    Creatinine 0.61 - 1.24 mg/dL 8.94  8.92  8.85   Sodium 135 - 145 mmol/L 138  139  143   Potassium 3.5 - 5.1 mmol/L 4.2  4.3  3.8   Chloride 98 - 111 mmol/L 101  101  109   CO2 22 - 32 mmol/L 19  24  21    Calcium  8.9 - 10.3 mg/dL 8.9  9.2  8.8    DG Chest 2 View Result Date: 08/16/2024 CLINICAL DATA:  Shortness of breath EXAM: CHEST - 2 VIEW COMPARISON:  Chest x-ray 06/03/2024 FINDINGS: There are patchy airspace opacities in the right mid lung N inferior left lower lobe. No definite pleural effusion or pneumothorax. Patient is status post cardiac surgery. Cardiomediastinal silhouette is stable. No acute fractures are seen. IMPRESSION: Patchy airspace opacities in the right mid lung and inferior left lower lobe, concerning for multifocal pneumonia. Follow-up imaging recommended to confirm complete resolution. Electronically Signed   By: Greig Pique M.D.   On: 08/16/2024 15:39    Family Communication: Discussed with patient, understand and agree. All questions answered.  Disposition: Status is: Inpatient Remains inpatient appropriate because: IV antibiotics for pneumonia, wean O2, monitor lactic acid  Planned Discharge Destination: Home with Home Health     Time spent: 52 minutes  Author: Concepcion Riser, MD 08/18/2024 12:02 PM Secure chat 7am to 7pm For on call review [email or web address].

## 2024-08-18 NOTE — Plan of Care (Signed)
  Problem: Acute Rehab PT Goals(only PT should resolve) Goal: Pt Will Go Supine/Side To Sit Outcome: Progressing Flowsheets (Taken 08/18/2024 1455) Pt will go Supine/Side to Sit: Independently Goal: Patient Will Perform Sitting Balance Outcome: Progressing Flowsheets (Taken 08/18/2024 1455) Patient will perform sitting balance: Independently Goal: Patient Will Transfer Sit To/From Stand Outcome: Progressing Flowsheets (Taken 08/18/2024 1455) Patient will transfer sit to/from stand: Independently Goal: Pt Will Transfer Bed To Chair/Chair To Bed Outcome: Progressing Flowsheets (Taken 08/18/2024 1455) Pt will Transfer Bed to Chair/Chair to Bed: Independently Goal: Pt Will Perform Standing Balance Or Pre-Gait Outcome: Progressing Flowsheets (Taken 08/18/2024 1455) Pt will perform standing balance or pre-gait:  Independently  with Modified Independent Goal: Pt Will Ambulate Outcome: Progressing Flowsheets (Taken 08/18/2024 1455) Pt will Ambulate:  100 feet  > 125 feet  Independently  with modified independence  with cane   Natalye Kott, SPT

## 2024-08-18 NOTE — Care Management Important Message (Signed)
 Important Message  Patient Details  Name: Benjamin [name] MRN: [id number] Date of Birth: [birth year]   Important Message Given:  N/A - LOS <3 / Initial given by admissions     [name] [name] 08/18/2024, 2:08 PM

## 2024-08-18 NOTE — Plan of Care (Signed)

## 2024-08-19 ENCOUNTER — Other Ambulatory Visit: Payer: Self-pay

## 2024-08-19 ENCOUNTER — Inpatient Hospital Stay (HOSPITAL_COMMUNITY)

## 2024-08-19 DIAGNOSIS — R0789 Other chest pain: Secondary | ICD-10-CM | POA: Diagnosis not present

## 2024-08-19 DIAGNOSIS — R918 Other nonspecific abnormal finding of lung field: Secondary | ICD-10-CM | POA: Diagnosis not present

## 2024-08-19 DIAGNOSIS — J188 Other pneumonia, unspecified organism: Secondary | ICD-10-CM

## 2024-08-19 DIAGNOSIS — Z951 Presence of aortocoronary bypass graft: Secondary | ICD-10-CM

## 2024-08-19 DIAGNOSIS — I700 Atherosclerosis of aorta: Secondary | ICD-10-CM | POA: Diagnosis not present

## 2024-08-19 LAB — COMPREHENSIVE METABOLIC PANEL WITH GFR
ALT: 20 U/L (ref 0–44)
AST: 18 U/L (ref 15–41)
Albumin: 3.5 g/dL (ref 3.5–5.0)
Alkaline Phosphatase: 35 U/L — ABNORMAL LOW (ref 38–126)
Anion gap: 12 (ref 5–15)
BUN: 17 mg/dL (ref 8–23)
CO2: 22 mmol/L (ref 22–32)
Calcium: 8.5 mg/dL — ABNORMAL LOW (ref 8.9–10.3)
Chloride: 107 mmol/L (ref 98–111)
Creatinine, Ser: 0.88 mg/dL (ref 0.61–1.24)
GFR, Estimated: >60 mL/min (ref >60)
Glucose, Bld: 103 mg/dL — ABNORMAL HIGH (ref 70–99)
Potassium: 5.3 mmol/L — ABNORMAL HIGH (ref 3.5–5.1)
Sodium: 141 mmol/L (ref 135–145)
Total Bilirubin: 1 mg/dL (ref 0.0–1.2)
Total Protein: 5.5 g/dL — ABNORMAL LOW (ref 6.5–8.1)

## 2024-08-19 LAB — CBC WITH DIFFERENTIAL/PLATELET
Abs Immature Granulocytes: 0.09 K/uL — ABNORMAL HIGH (ref 0.00–0.07)
Basophils Absolute: 0 K/uL (ref 0.0–0.1)
Basophils Relative: 0 %
Eosinophils Absolute: 0.1 K/uL (ref 0.0–0.5)
Eosinophils Relative: 2 %
HCT: 44.4 % (ref 39.0–52.0)
Hemoglobin: 14.9 g/dL (ref 13.0–17.0)
Immature Granulocytes: 1 %
Lymphocytes Relative: 19 %
Lymphs Abs: 1.7 K/uL (ref 0.7–4.0)
MCH: 32.3 pg (ref 26.0–34.0)
MCHC: 33.6 g/dL (ref 30.0–36.0)
MCV: 96.1 fL (ref 80.0–100.0)
Monocytes Absolute: 0.6 K/uL (ref 0.1–1.0)
Monocytes Relative: 7 %
Neutro Abs: 6.5 K/uL (ref 1.7–7.7)
Neutrophils Relative %: 71 %
Platelets: 225 K/uL (ref 150–400)
RBC: 4.62 MIL/uL (ref 4.22–5.81)
RDW: 14.1 % (ref 11.5–15.5)
WBC: 9.1 K/uL (ref 4.0–10.5)
nRBC: 0 % (ref 0.0–0.2)

## 2024-08-19 LAB — TROPONIN T, HIGH SENSITIVITY
Troponin T High Sensitivity: <15 ng/L (ref 0–19)
Troponin T High Sensitivity: <15 ng/L (ref 0–19)

## 2024-08-19 LAB — MAGNESIUM: Magnesium: 2 mg/dL (ref 1.7–2.4)

## 2024-08-19 LAB — LACTIC ACID, PLASMA: Lactic Acid, Venous: 1.4 mmol/L (ref 0.5–1.9)

## 2024-08-19 LAB — MYCOPLASMA PNEUMONIAE ANTIBODY, IGM: Mycoplasma pneumo IgM: <770 U/mL (ref 0–769)

## 2024-08-19 LAB — PHOSPHORUS: Phosphorus: 3 mg/dL (ref 2.5–4.6)

## 2024-08-19 MED ORDER — KETOROLAC TROMETHAMINE 15 MG/ML IJ SOLN
15.0000 mg | Freq: Once | INTRAMUSCULAR | Status: AC
  Administered 2024-08-19: 15 mg via INTRAVENOUS
  Filled 2024-08-19: qty 1

## 2024-08-19 MED ORDER — AMOXICILLIN-POT CLAVULANATE 500-125 MG PO TABS: 1.0000 | ORAL_TABLET | Freq: Two times a day (BID) | ORAL | 0 refills | Status: AC

## 2024-08-19 MED ORDER — SIMETHICONE 80 MG PO CHEW
160.0000 mg | CHEWABLE_TABLET | Freq: Once | ORAL | Status: AC
  Administered 2024-08-19: 160 mg via ORAL
  Filled 2024-08-19: qty 2

## 2024-08-19 MED ORDER — NITROGLYCERIN 0.4 MG SL SUBL
0.4000 mg | SUBLINGUAL_TABLET | SUBLINGUAL | Status: DC | PRN
  Administered 2024-08-19: 0.4 mg via SUBLINGUAL
  Filled 2024-08-19: qty 1

## 2024-08-19 NOTE — Discharge Summary (Signed)
 Physician Discharge Summary   Patient: Benjamin [name] MRN: [id number] DOB: 1941-07-19  Admit date:     08/16/2024  Discharge date: {dischdate:26783}  Discharge Physician: Concepcion Riser   PCP: Tobie Suzzane POUR, MD   Recommendations at discharge:  {Tip this will not be part of the note when signed- Example include specific recommendations for outpatient follow-up, pending tests to follow-up on. (Optional):26781}  PCP follow up in 1 week. Pulmonology follow up suggested.  Discharge Diagnoses: Principal Problem:   Community acquired pneumonia Active Problems:   Sepsis due to pneumonia [facility])  Resolved Problems:   * No resolved hospital problems. *  Hospital Course: Benjamin [name] is a 83 y.o. male with medical history significant of BPH, diastolic congestive heart failure, coronary artery disease status post CABG, COPD, degenerative disc disease, GERD, hyperlipidemia, prediabetes who was otherwise well until last night when he started having worsening shortness of breath.  He has some associated left-sided chest pain that is pleuritic, denies dizziness, nausea vomiting abdominal pain or urinary complaints.  According to patient although he has some oxygen  at home he does not need it every day.    Patient is found to have chest x-ray finding of multifocal infiltrate, requiring 2 L supplemental oxygen , did not meet sepsis criteria with lactic acid greater than 6, admitted to hospitalist service for further management evaluation.   Assessment and Plan: Severe sepsis secondary to community-acquired pneumonia likely bacterial Patient met SIRS criteria in the setting of tachycardia and leukocytosis 15.3 with a lactic acid of >6.  Lactic acid trended down to 1.4 today. Viral panel came back negative. Chest x-ray showing multifocal pneumonia. Blood cultures negative, MRSA screen negative. Ceftriaxone  and azithromycin  transitioned to Augmentin  for 5 more days. Advised PCP follow up in 1 week.     BPH Continue tamsulosin  and finasteride .   COPD Continue as needed nebulization.  Resumed home inhalers, trelegy.   Chronic diastolic congestive heart failure Coronary artery disease status post CABG BP improved, resumed beta blocker home dose.   Degenerative disc disease Continue home tramadol    GERD Continue pantoprazole    Hyperlipidemia Continue Repatha .   Prediabetes A1c 6.0 Encouraged to watch carbohydrate intake.  Diabetes education provided.     {Tip this will not be part of the note when signed Body mass index is 26.98 kg/m. , ,  (Optional):26781}  {(NOTE) Pain control PDMP Statment (Optional):26782} Consultants: none Procedures performed: none  Disposition: Home health Diet recommendation:  Discharge Diet Orders (From admission, onward)     Start     Ordered   08/19/24 0000  Diet - low sodium heart healthy        08/19/24 1044   08/19/24 0000  Diet Carb Modified        08/19/24 1044           Cardiac and Carb modified diet DISCHARGE MEDICATION: Allergies as of 08/19/2024       Reactions   Arsenic Swelling   Severe swelling if patient comes in contact    Contrast Media [iodinated Contrast Media] Swelling, Rash   Nitrofurantoin  Monohyd Macro Itching   Completed the full course while taking Benadryl  for    Statins Rash   Joint pain        Medication List     STOP taking these medications    predniSONE  20 MG tablet Commonly known as: DELTASONE        TAKE these medications    Airsupra  90-80 MCG/ACT Aero Generic drug: Albuterol -Budesonide  Inhale 2 puffs into  the lungs every 4 (four) hours as needed.   amoxicillin -clavulanate 500-125 MG tablet Commonly known as: Augmentin  Take 1 tablet by mouth in the morning and at bedtime for 5 days.   Colestid  1 g tablet Generic drug: colestipol  Take 2 g by mouth 2 (two) times daily.   Cranberry 400 MG Tabs Take 2 tablets by mouth 2 (two) times daily.   Ensifentrine  3 MG/2.5ML  Susp Inhale 3 mg into the lungs in the morning and at bedtime.   finasteride  5 MG tablet Commonly known as: PROSCAR  Take 5 mg by mouth daily.   furosemide  20 MG tablet Commonly known as: LASIX  Take 1 tablet (20 mg total) by mouth daily as needed for fluid or edema (for swelling).   ipratropium 0.06 % nasal spray Commonly known as: ATROVENT  Place 2 sprays into both nostrils every 12 (twelve) hours.   levalbuterol  45 MCG/ACT inhaler Commonly known as: XOPENEX  HFA Inhale 1-2 puffs into the lungs every 4 (four) hours as needed for wheezing.   metoprolol  tartrate 25 MG tablet Commonly known as: LOPRESSOR  Take 1/2 (one-half) tablet by mouth twice daily What changed: See the new instructions.   nitroGLYCERIN  0.4 MG SL tablet Commonly known as: NITROSTAT  Place 1 tablet (0.4 mg total) under the tongue every 5 (five) minutes as needed for chest pain.   OXYGEN  Inhale 2-3 L into the lungs daily as needed (for breathing).   Repatha  SureClick 140 MG/ML Soaj Generic drug: Evolocumab  INJECT 140MG  SUBCUTANEOUSLY EVERY 14 DAYS AS DIRECTED BY MD   tamsulosin  0.4 MG Caps capsule Commonly known as: FLOMAX  TAKE 2 CAPSULES BY MOUTH ONCE DAILY AFTER SUPPER FOR PROSTATE What changed:  how much to take how to take this when to take this   traMADol  50 MG tablet Commonly known as: ULTRAM  Take 1 tablet (50 mg total) by mouth every 12 (twelve) hours as needed.   Trelegy Ellipta  200-62.5-25 MCG/ACT Aepb Generic drug: Fluticasone -Umeclidin-Vilant USE 1 INHALATION ORALLY IN THE MORNING   TYLENOL  500 MG tablet Generic drug: acetaminophen  Take 500 mg by mouth every 8 (eight) hours as needed for mild pain (pain score 1-3).        Discharge Exam: Filed Weights   08/16/24 1427 08/17/24 1110 08/19/24 0500  Weight: 85.1 kg 86.5 kg 85.3 kg      08/19/2024   11:00 AM 08/19/2024   10:00 AM 08/19/2024    9:00 AM  Vitals with BMI  Systolic 153 125 876  Diastolic 84 86 70  Pulse 82 87 85    General - Elderly Caucasian male, no apparent distress HEENT - PERRLA, EOMI, atraumatic head, non tender sinuses. Lung - Clear, basal rales, rhonchi, no wheezes. Heart - S1, S2 heard, no murmurs, rubs, trace pedal edema. Abdomen - Soft, non tender, bowel sounds heard Neuro - Alert, awake and oriented x 3, non focal exam. Skin - Warm and dry.  Condition at discharge: stable  The results of significant diagnostics from this hospitalization (including imaging, microbiology, ancillary and laboratory) are listed below for reference.   Imaging Studies: DG CHEST PORT 1 VIEW Result Date: 08/19/2024 EXAM: 1 VIEW(S) XRAY OF THE CHEST 08/19/2024 01:40:00 AM COMPARISON: 08/16/2024 CLINICAL HISTORY: Chest pressure FINDINGS: LUNGS AND PLEURA: Decreased left lower lung airspace opacity. No pleural effusion. No pneumothorax. HEART AND MEDIASTINUM: CABG markers noted. Aortic atherosclerosis. BONES AND SOFT TISSUES: Sternotomy wires noted. No acute osseous abnormality. IMPRESSION: 1. Decreased left lower lung airspace opacity. Electronically signed by: Franky Stanford MD 08/19/2024 02:23 AM EST  RP Workstation: [id number]   DG Chest 2 View Result Date: 08/16/2024 CLINICAL DATA:  Shortness of breath EXAM: CHEST - 2 VIEW COMPARISON:  Chest x-ray 06/03/2024 FINDINGS: There are patchy airspace opacities in the right mid lung N inferior left lower lobe. No definite pleural effusion or pneumothorax. Patient is status post cardiac surgery. Cardiomediastinal silhouette is stable. No acute fractures are seen. IMPRESSION: Patchy airspace opacities in the right mid lung and inferior left lower lobe, concerning for multifocal pneumonia. Follow-up imaging recommended to confirm complete resolution. Electronically Signed   By: Greig Pique M.D.   On: 08/16/2024 15:39    Microbiology: Results for orders placed or performed during the hospital encounter of 08/16/24  Culture, blood (single)     Status: None (Preliminary result)    Collection Time: 08/16/24  4:59 PM   Specimen: Right Antecubital; Blood  Result Value Ref Range Status   Specimen Description RIGHT ANTECUBITAL AEROBIC BOTTLE ONLY  Final   Special Requests   Final    Blood Culture results may not be optimal due to an inadequate volume of blood received in culture bottles   Culture   Final    NO GROWTH 3 DAYS Performed at [facility], [address], [place], [place] [address]    Report Status PENDING  Incomplete  MRSA Next Gen by PCR, Nasal     Status: None   Collection Time: 08/17/24 10:52 AM   Specimen: Nasal Mucosa; Nasal Swab  Result Value Ref Range Status   MRSA by PCR Next Gen NOT DETECTED NOT DETECTED Final    Comment: (NOTE) The GeneXpert MRSA Assay (FDA approved for NASAL specimens only), is one component of a comprehensive MRSA colonization surveillance program. It is not intended to diagnose MRSA infection nor to guide or monitor treatment for MRSA infections. Test performance is not FDA approved in patients less than 54 years old. Performed at [facility], [address], [place], [place] [address]     Labs: CBC: Recent Labs  Lab 08/16/24 1551 08/17/24 0403 08/19/24 0144  WBC 15.3* 9.7 9.1  NEUTROABS 12.5*  --  6.5  HGB 16.8 15.0 14.9  HCT 50.1 45.8 44.4  MCV 96.0 98.3 96.1  PLT 271 240 225   Basic Metabolic Panel: Recent Labs  Lab 08/16/24 1551 08/17/24 0403 08/19/24 0144  NA 139 138 141  K 4.3 4.2 5.3*  CL 101 101 107  CO2 24 19* 22  GLUCOSE 120* 210* 103*  BUN 18 17 17   CREATININE 1.07 1.05 0.88  CALCIUM  9.2 8.9 8.5*  MG  --   --  2.0  PHOS  --   --  3.0   Liver Function Tests: Recent Labs  Lab 08/16/24 1551 08/19/24 0144  AST 18 18  ALT 18 20  ALKPHOS 44 35*  BILITOT 1.9* 1.0  PROT 6.3* 5.5*  ALBUMIN  4.1 3.5   CBG: No results for input(s): GLUCAP in the last 168 hours.  Discharge time spent: 37 minutes.  Signed: Concepcion Riser, MD [facility] 08/19/2024

## 2024-08-19 NOTE — Discharge Summary (Incomplete)
 Physician Discharge Summary   Patient: Benjamin [name] MRN: [id number] DOB: 01/06/1941  Admit date:     08/16/2024  Discharge date: {dischdate:26783}  Discharge Physician: Concepcion Riser   PCP: Tobie Suzzane POUR, MD   Recommendations at discharge:  {Tip this will not be part of the note when signed- Example include specific recommendations for outpatient follow-up, pending tests to follow-up on. (Optional):26781}  ***  Discharge Diagnoses: Principal Problem:   Community acquired pneumonia Active Problems:   Sepsis due to pneumonia [facility])  Resolved Problems:   * No resolved hospital problems. [facility] Course: No notes on file  Assessment and Plan: No notes have been filed under this hospital service. Service: Hospitalist     {Tip this will not be part of the note when signed Body mass index is 26.98 kg/m. , ,  (Optional):26781}  {(NOTE) Pain control PDMP Statment (Optional):26782} Consultants: *** Procedures performed: ***  Disposition: {Plan; Disposition:26390} Diet recommendation:  Discharge Diet Orders (From admission, onward)     Start     Ordered   08/19/24 0000  Diet - low sodium heart healthy        08/19/24 1044   08/19/24 0000  Diet Carb Modified        08/19/24 1044           {Diet_Plan:26776} DISCHARGE MEDICATION: Allergies as of 08/19/2024       Reactions   Arsenic Swelling   Severe swelling if patient comes in contact    Contrast Media [iodinated Contrast Media] Swelling, Rash   Nitrofurantoin  Monohyd Macro Itching   Completed the full course while taking Benadryl  for    Statins Rash   Joint pain        Medication List     STOP taking these medications    predniSONE  20 MG tablet Commonly known as: DELTASONE        TAKE these medications    Airsupra  90-80 MCG/ACT Aero Generic drug: Albuterol -Budesonide  Inhale 2 puffs into the lungs every 4 (four) hours as needed.   amoxicillin -clavulanate 500-125 MG tablet Commonly known  as: Augmentin  Take 1 tablet by mouth in the morning and at bedtime for 5 days.   Colestid  1 g tablet Generic drug: colestipol  Take 2 g by mouth 2 (two) times daily.   Cranberry 400 MG Tabs Take 2 tablets by mouth 2 (two) times daily.   Ensifentrine  3 MG/2.5ML Susp Inhale 3 mg into the lungs in the morning and at bedtime.   finasteride  5 MG tablet Commonly known as: PROSCAR  Take 5 mg by mouth daily.   furosemide  20 MG tablet Commonly known as: LASIX  Take 1 tablet (20 mg total) by mouth daily as needed for fluid or edema (for swelling).   ipratropium 0.06 % nasal spray Commonly known as: ATROVENT  Place 2 sprays into both nostrils every 12 (twelve) hours.   levalbuterol  45 MCG/ACT inhaler Commonly known as: XOPENEX  HFA Inhale 1-2 puffs into the lungs every 4 (four) hours as needed for wheezing.   metoprolol  tartrate 25 MG tablet Commonly known as: LOPRESSOR  Take 1/2 (one-half) tablet by mouth twice daily What changed: See the new instructions.   nitroGLYCERIN  0.4 MG SL tablet Commonly known as: NITROSTAT  Place 1 tablet (0.4 mg total) under the tongue every 5 (five) minutes as needed for chest pain.   OXYGEN  Inhale 2-3 L into the lungs daily as needed (for breathing).   Repatha  SureClick 140 MG/ML Soaj Generic drug: Evolocumab  INJECT 140MG  SUBCUTANEOUSLY EVERY 14 DAYS AS DIRECTED BY  MD   tamsulosin  0.4 MG Caps capsule Commonly known as: FLOMAX  TAKE 2 CAPSULES BY MOUTH ONCE DAILY AFTER SUPPER FOR PROSTATE What changed:  how much to take how to take this when to take this   traMADol  50 MG tablet Commonly known as: ULTRAM  Take 1 tablet (50 mg total) by mouth every 12 (twelve) hours as needed.   Trelegy Ellipta  200-62.5-25 MCG/ACT Aepb Generic drug: Fluticasone -Umeclidin-Vilant USE 1 INHALATION ORALLY IN THE MORNING   TYLENOL  500 MG tablet Generic drug: acetaminophen  Take 500 mg by mouth every 8 (eight) hours as needed for mild pain (pain score 1-3).         Discharge Exam: Filed Weights   08/16/24 1427 08/17/24 1110 08/19/24 0500  Weight: 85.1 kg 86.5 kg 85.3 kg   ***  Condition at discharge: {DC Condition:26389}  The results of significant diagnostics from this hospitalization (including imaging, microbiology, ancillary and laboratory) are listed below for reference.   Imaging Studies: DG CHEST PORT 1 VIEW Result Date: 08/19/2024 EXAM: 1 VIEW(S) XRAY OF THE CHEST 08/19/2024 01:40:00 AM COMPARISON: 08/16/2024 CLINICAL HISTORY: Chest pressure FINDINGS: LUNGS AND PLEURA: Decreased left lower lung airspace opacity. No pleural effusion. No pneumothorax. HEART AND MEDIASTINUM: CABG markers noted. Aortic atherosclerosis. BONES AND SOFT TISSUES: Sternotomy wires noted. No acute osseous abnormality. IMPRESSION: 1. Decreased left lower lung airspace opacity. Electronically signed by: Franky Stanford MD 08/19/2024 02:23 AM EST RP Workstation: [id number]   DG Chest 2 View Result Date: 08/16/2024 CLINICAL DATA:  Shortness of breath EXAM: CHEST - 2 VIEW COMPARISON:  Chest x-ray 06/03/2024 FINDINGS: There are patchy airspace opacities in the right mid lung N inferior left lower lobe. No definite pleural effusion or pneumothorax. Patient is status post cardiac surgery. Cardiomediastinal silhouette is stable. No acute fractures are seen. IMPRESSION: Patchy airspace opacities in the right mid lung and inferior left lower lobe, concerning for multifocal pneumonia. Follow-up imaging recommended to confirm complete resolution. Electronically Signed   By: Greig Pique M.D.   On: 08/16/2024 15:39    Microbiology: Results for orders placed or performed during the hospital encounter of 08/16/24  Culture, blood (single)     Status: None (Preliminary result)   Collection Time: 08/16/24  4:59 PM   Specimen: Right Antecubital; Blood  Result Value Ref Range Status   Specimen Description RIGHT ANTECUBITAL AEROBIC BOTTLE ONLY  Final   Special Requests   Final    Blood  Culture results may not be optimal due to an inadequate volume of blood received in culture bottles   Culture   Final    NO GROWTH 2 DAYS Performed at [facility], [address], [place], [place] [address]    Report Status PENDING  Incomplete  MRSA Next Gen by PCR, Nasal     Status: None   Collection Time: 08/17/24 10:52 AM   Specimen: Nasal Mucosa; Nasal Swab  Result Value Ref Range Status   MRSA by PCR Next Gen NOT DETECTED NOT DETECTED Final    Comment: (NOTE) The GeneXpert MRSA Assay (FDA approved for NASAL specimens only), is one component of a comprehensive MRSA colonization surveillance program. It is not intended to diagnose MRSA infection nor to guide or monitor treatment for MRSA infections. Test performance is not FDA approved in patients less than 18 years old. Performed at [facility], [address], [place], [place] [address]     Labs: CBC: Recent Labs  Lab 08/16/24 1551 08/17/24 0403 08/19/24 0144  WBC 15.3* 9.7 9.1  NEUTROABS  12.5*  --  6.5  HGB 16.8 15.0 14.9  HCT 50.1 45.8 44.4  MCV 96.0 98.3 96.1  PLT 271 240 225   Basic Metabolic Panel: Recent Labs  Lab 08/16/24 1551 08/17/24 0403 08/19/24 0144  NA 139 138 141  K 4.3 4.2 5.3*  CL 101 101 107  CO2 24 19* 22  GLUCOSE 120* 210* 103*  BUN 18 17 17   CREATININE 1.07 1.05 0.88  CALCIUM  9.2 8.9 8.5*  MG  --   --  2.0  PHOS  --   --  3.0   Liver Function Tests: Recent Labs  Lab 08/16/24 1551 08/19/24 0144  AST 18 18  ALT 18 20  ALKPHOS 44 35*  BILITOT 1.9* 1.0  PROT 6.3* 5.5*  ALBUMIN  4.1 3.5   CBG: No results for input(s): GLUCAP in the last 168 hours.  Discharge time spent: {LESS THAN/GREATER UYJW:73611} 30 minutes.  Signed: Concepcion Riser, MD [facility] 08/19/2024

## 2024-08-19 NOTE — Plan of Care (Signed)

## 2024-08-19 NOTE — Significant Event (Addendum)
       CROSS COVER NOTE  NAME: Etheridge Geil MRN: [id number] DOB : 12-12-1940 ATTENDING PHYSICIAN: Darci Pore, MD    Date of Service   08/19/2024   HPI/Events of Note   [facility] Cross Cover at [facility] reports patient complaint of chest pain statin it feels like an elephant on my chest HPI: patient with significant chronic conditions as BPH. GERD. Diastolic CHF,d prediabetes, CAD,DDD, CABG, COPD and hyperlipidemia. He met SIRS criteria on admission with elevated lactic acidosis, leukocytosis and multifocal pneumonia   as source of infection. He was treated with antibiotics and fluids  Interventions   Assessment/Plan: Blood pressure 116/71, pulse 81, temperature 98.4 F (36.9 C), temperature source Oral, resp. rate (!) 21, height 5' 10 (1.778 m), weight 86.5 kg, SpO2 99%.   Bedside patient reports almost complete relief after he raised the head of his bed up He has no distress now but states his chest discomfort was more than it has  been Repeat lactic wnl 1.4. Troponin T trend < 15 flat eKG reviewed by me SR no STE or other signs of ischemia Chest x ray reviewed by me, both lungs with improved aeration Continue to monitor on tele SL nitroglycerin  prn chest pain        Erminio LITTIE Cone NP [facility] Cross Cover 7pm-7am - check amion for availability Pager [phone]

## 2024-08-19 NOTE — Care Management Important Message (Signed)
 Important Message  Patient Details  Name: Cowen Pesqueira MRN: [id number] Date of Birth: 06/13/1941   Important Message Given:  Yes - Medicare IM     Lynnell Fiumara L Lainee Lehrman 08/19/2024, 11:29 AM

## 2024-08-20 ENCOUNTER — Encounter (HOSPITAL_COMMUNITY)

## 2024-08-20 ENCOUNTER — Telehealth: Payer: Self-pay

## 2024-08-20 LAB — LEGIONELLA PNEUMOPHILA SEROGP 1 UR AG: L. pneumophila Serogp 1 Ur Ag: NEGATIVE

## 2024-08-20 NOTE — Transitions of Care (Post Inpatient/ED Visit) (Signed)
   08/20/2024  Name: Benjamin [name] MRN: [id number] DOB: 1941/09/09  Today's TOC FU Call Status: Today's TOC FU Call Status:: Unsuccessful Call (1st Attempt) Unsuccessful Call (1st Attempt) Date: 08/20/24  Attempted to reach the patient regarding the most recent Inpatient/ED visit. RNCM left confidential voice mail with call back number for patient to call back.   Follow Up Plan: Additional outreach attempts will be made to reach the patient to complete the Transitions of Care (Post Inpatient/ED visit) call.    Bing Edison MSN, RN RN [facility] [employer] [facility]  [facility] Office Hours M-F [phone] Direct Dial: [phone] Main Phone [phone]  Fax: [phone] [email or web address]

## 2024-08-21 ENCOUNTER — Other Ambulatory Visit: Payer: Self-pay | Admitting: Internal Medicine

## 2024-08-21 DIAGNOSIS — N401 Enlarged prostate with lower urinary tract symptoms: Secondary | ICD-10-CM

## 2024-08-21 LAB — CULTURE, BLOOD (SINGLE): Culture: NO GROWTH

## 2024-08-23 ENCOUNTER — Telehealth: Payer: Self-pay

## 2024-08-23 ENCOUNTER — Encounter (HOSPITAL_COMMUNITY)

## 2024-08-23 NOTE — Telephone Encounter (Signed)
 Copied from CRM #[id number]. Topic: Appointments - Scheduling Inquiry for Clinic >> Aug 23, 2024 10:26 AM Gustabo D wrote: Pt needs a hospital fu he was released from he hospital Aug 20, 2024 there aren't any appts available in 14 days or sooner.

## 2024-08-23 NOTE — Transitions of Care (Post Inpatient/ED Visit) (Signed)
   08/23/2024  Name: Elijio Staples MRN: [id number] DOB: December 05, 1940  Today's TOC FU Call Status: Today's TOC FU Call Status:: Successful TOC FU Call Completed TOC FU Call Complete Date: 08/23/24  Patient's Name and Date of Birth confirmed. Name, DOB  Transition Care Management Follow-up Telephone Call How have you been since you were released from the hospital?: Better (breathing much better.) Any questions or concerns?: No  Items Reviewed: Did you receive and understand the discharge instructions provided?: Yes Medications obtained,verified, and reconciled?: Yes (Medications Reviewed)  Medications Reviewed Today: verbally states that he has all his medications. Declined reviewed Medications Reviewed Today   Medications were not reviewed in this encounter     Follow up appointments reviewed: PCP Follow-up appointment confirmed?: Yes Date of PCP follow-up appointment?: 08/26/24   Placed call to patient for TOC.  Explained purpose of call and patient declined review of his medications and instructions. States I can read.   Encouraged patient to call me back if he needs any assistance.  Alan Ee, RN, BSN, CEN [employer]- Transition of Care Team.  [facility] [phone]

## 2024-08-23 NOTE — Telephone Encounter (Signed)
 scheduled

## 2024-08-24 ENCOUNTER — Other Ambulatory Visit: Payer: Self-pay | Admitting: Internal Medicine

## 2024-08-24 ENCOUNTER — Ambulatory Visit

## 2024-08-24 ENCOUNTER — Ambulatory Visit: Admitting: Internal Medicine

## 2024-08-24 ENCOUNTER — Encounter: Payer: Self-pay | Admitting: Internal Medicine

## 2024-08-24 VITALS — BP 102/68 | HR 61 | Ht 71.0 in | Wt 181.0 lb

## 2024-08-24 DIAGNOSIS — J9611 Chronic respiratory failure with hypoxia: Secondary | ICD-10-CM

## 2024-08-24 DIAGNOSIS — I5032 Chronic diastolic (congestive) heart failure: Secondary | ICD-10-CM

## 2024-08-24 DIAGNOSIS — R6520 Severe sepsis without septic shock: Secondary | ICD-10-CM

## 2024-08-24 DIAGNOSIS — Z09 Encounter for follow-up examination after completed treatment for conditions other than malignant neoplasm: Secondary | ICD-10-CM

## 2024-08-24 DIAGNOSIS — J329 Chronic sinusitis, unspecified: Secondary | ICD-10-CM

## 2024-08-24 DIAGNOSIS — J4489 Other specified chronic obstructive pulmonary disease: Secondary | ICD-10-CM

## 2024-08-24 DIAGNOSIS — K219 Gastro-esophageal reflux disease without esophagitis: Secondary | ICD-10-CM

## 2024-08-24 DIAGNOSIS — A419 Sepsis, unspecified organism: Secondary | ICD-10-CM | POA: Diagnosis not present

## 2024-08-24 DIAGNOSIS — J189 Pneumonia, unspecified organism: Secondary | ICD-10-CM | POA: Diagnosis not present

## 2024-08-24 NOTE — Assessment & Plan Note (Addendum)
 CXR showed - Patchy airspace opacities in the right mid lung and inferior left lower lobe, concerning for multifocal pneumonia Was treated with IV antibiotics, followed by Augmentin  Symptoms improved now Obtain CXR after 6 weeks

## 2024-08-24 NOTE — Patient Instructions (Addendum)
 Please get chest x-ray done after 4 weeks.  Please complete course of Augmentin .  Please use Trelegy regularly and use Airsupra  as needed for shortness of breath or wheezing.

## 2024-08-24 NOTE — Assessment & Plan Note (Addendum)
 Overall well-controlled with Trelegy now Has Ohtuvayre  nebulizer as well Airsupra  inhaler as needed for dyspnea or wheezing Has oxygen  desaturation upon ambulation, has home O2 for as needed use/upon ambulation Followed by pulmonology and Allergy  specialist

## 2024-08-24 NOTE — Assessment & Plan Note (Signed)
 Likely due to multifocal pneumonia, now resolved Advised to complete course of Augmentin  Advised to maintain at least 50 ounces of fluid intake in a day

## 2024-08-24 NOTE — Assessment & Plan Note (Signed)
 Hospital chart reviewed, including discharge summary Was admitted for acute hypoxic respiratory failure secondary to CAP Now improved
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
## 2024-08-24 NOTE — Assessment & Plan Note (Signed)
He has persistent symptoms despite symptomatic treatment Singulair for allergies, avoid daily decongestant use Atrovent nasal spray PRN Advised to use humidifier or vaporizer at home Followed by allergy and immunology

## 2024-08-24 NOTE — Assessment & Plan Note (Addendum)
 Has a history of HFpEF Has mild leg swelling Has had cardiology evaluation Has Lasix  as needed for leg swelling or dyspnea, but has not been taking it despite leg swelling - advised to take Lasix  twice in a week for now, potassium supplement with Lasix  to avoid hypokalemia related cramping

## 2024-08-24 NOTE — Assessment & Plan Note (Addendum)
 Likely due to COPD Was recently worse due to multifocal pneumonia/COPD exacerbation - better now, has home O2 for ambulation

## 2024-08-25 ENCOUNTER — Encounter (HOSPITAL_COMMUNITY)

## 2024-08-26 ENCOUNTER — Inpatient Hospital Stay: Admitting: Internal Medicine

## 2024-08-27 ENCOUNTER — Encounter (HOSPITAL_COMMUNITY)

## 2024-08-28 ENCOUNTER — Other Ambulatory Visit: Payer: Self-pay | Admitting: Internal Medicine

## 2024-08-28 DIAGNOSIS — K219 Gastro-esophageal reflux disease without esophagitis: Secondary | ICD-10-CM

## 2024-08-30 ENCOUNTER — Encounter (HOSPITAL_COMMUNITY)
Admission: RE | Admit: 2024-08-30 | Discharge: 2024-08-30 | Disposition: A | Discharge status: Home / Self Care | Source: Ambulatory Visit

## 2024-08-30 ENCOUNTER — Other Ambulatory Visit: Payer: Self-pay | Admitting: Internal Medicine

## 2024-08-30 ENCOUNTER — Telehealth (HOSPITAL_COMMUNITY): Payer: Self-pay

## 2024-08-30 DIAGNOSIS — K219 Gastro-esophageal reflux disease without esophagitis: Secondary | ICD-10-CM

## 2024-08-30 DIAGNOSIS — J449 Chronic obstructive pulmonary disease, unspecified: Secondary | ICD-10-CM | POA: Diagnosis present

## 2024-08-30 MED ORDER — PANTOPRAZOLE SODIUM 40 MG PO TBEC: 40.0000 mg | DELAYED_RELEASE_TABLET | Freq: Two times a day (BID) | ORAL | 1 refills | Status: AC

## 2024-08-30 NOTE — Telephone Encounter (Signed)
 Error in encounter

## 2024-08-30 NOTE — Addendum Note (Signed)
 Addended by: Zada Haser H on: 08/30/2024 04:38 PM   Modules accepted: Orders

## 2024-08-30 NOTE — Progress Notes (Signed)
 Daily Session Note  Patient Details  Name: Benjamin [name] MRN: [id number] Date of Birth: 20-Nov-1940 Referring Provider:   Flowsheet Row CARDIAC REHAB PHASE II ORIENTATION from 08/11/2019 in [facility] CARDIAC REHABILITATION  Referring Provider Charls    Encounter Date: 08/30/2024  Check In:  Session Check In - 08/30/24 1437       Check-In   Supervising physician immediately available to respond to emergencies See telemetry face sheet for immediately available MD    Location [facility]    Staff Present Powell Benders, BS, Exercise Physiologist;Kattie Santoyo Zina, RN    Virtual Visit No    Medication changes reported     No    Fall or balance concerns reported    No    Warm-up and Cool-down Performed on first and last piece of equipment    Resistance Training Performed Yes    VAD Patient? No    PAD/SET Patient? No      Pain Assessment   Currently in Pain? No/denies          Capillary Blood Glucose: No results found for this or any previous visit (from the past 24 hours).    Tobacco Use History[1]  Goals Met:  Proper associated with RPD/PD & O2 Sat Independence with exercise equipment Using PLB without cueing & demonstrates good technique Exercise tolerated well No report of concerns or symptoms today Strength training completed today  Goals Unmet:  Not Applicable  Comments: Pt able to follow exercise prescription today without complaint.  Will continue to monitor for progression.        [1]  Social History Tobacco Use  Smoking Status Former   Current packs/day: 0.00   Average packs/day: 0.5 packs/day for 50.0 years (25.0 ttl pk-yrs)   Types: Cigarettes   Start date: 35   Quit date: 2005   Years since quitting: 20.9   Passive exposure: Never  Smokeless Tobacco Never

## 2024-09-01 ENCOUNTER — Encounter (HOSPITAL_COMMUNITY): Admission: RE | Admit: 2024-09-01 | Discharge: 2024-09-01

## 2024-09-01 DIAGNOSIS — J449 Chronic obstructive pulmonary disease, unspecified: Secondary | ICD-10-CM | POA: Diagnosis not present

## 2024-09-01 NOTE — Progress Notes (Signed)
 Daily Session Note  Patient Details  Name: Benjamin [name] MRN: [id number] Date of Birth: [birth year] Referring Provider:   Flowsheet Row CARDIAC REHAB PHASE II ORIENTATION from 08/11/2019 in [facility] CARDIAC REHABILITATION  Referring Provider Charls    Encounter Date: 09/01/2024  Check In:  Session Check In - 09/01/24 1424       Check-In   Supervising physician immediately available to respond to emergencies See telemetry face sheet for immediately available MD    Location [facility]    Staff Present Laymon Rattler, BSN, RN, Rosalba Gelineau, MA, RCEP, CCRP, CCET;Mateusz Neilan [employer], RN    Virtual Visit No    Medication changes reported     No    Fall or balance concerns reported    No    Tobacco Cessation No Change    Warm-up and Cool-down Performed on first and last piece of equipment    Resistance Training Performed Yes    VAD Patient? No    PAD/SET Patient? No      Pain Assessment   Currently in Pain? No/denies    Pain Score 0-No pain    Multiple Pain Sites No          Capillary Blood Glucose: No results found for this or any previous visit (from the past 24 hours).    Tobacco Use History[1]  Goals Met:  Proper associated with RPD/PD & O2 Sat Independence with exercise equipment Using PLB without cueing & demonstrates good technique Exercise tolerated well Queuing for purse lip breathing No report of concerns or symptoms today Strength training completed today  Goals Unmet:  Not Applicable  Comments: SABRASABRAPt able to follow exercise prescription today without complaint.  Will continue to monitor for progression.        [1]  Social History Tobacco Use  Smoking Status Former   Current packs/day: 0.00   Average packs/day: 0.5 packs/day for 50.0 years (25.0 ttl pk-yrs)   Types: Cigarettes   Start date: 60   Quit date: 2005   Years since quitting: 20.9   Passive exposure: Never  Smokeless Tobacco Never

## 2024-09-03 ENCOUNTER — Encounter (HOSPITAL_COMMUNITY)

## 2024-09-03 DIAGNOSIS — J449 Chronic obstructive pulmonary disease, unspecified: Secondary | ICD-10-CM

## 2024-09-03 NOTE — Progress Notes (Signed)
 Daily Session Note  Patient Details  Name: Benjamin [name] MRN: [id number] Date of Birth: 1941/08/13 Referring Provider:   Flowsheet Row CARDIAC REHAB PHASE II ORIENTATION from 08/11/2019 in [facility] CARDIAC REHABILITATION  Referring Provider Charls    Encounter Date: 09/03/2024  Check In:  Session Check In - 09/03/24 1256       Check-In   Supervising physician immediately available to respond to emergencies See telemetry face sheet for immediately available MD    Location [facility]    Staff Present Laymon Rattler, BSN, RN, WTA-C;Victoria Zina, RN    Virtual Visit No    Medication changes reported     No    Fall or balance concerns reported    No    Tobacco Cessation No Change    Warm-up and Cool-down Performed on first and last piece of equipment    Resistance Training Performed Yes    VAD Patient? No    PAD/SET Patient? No      Pain Assessment   Currently in Pain? No/denies          Capillary Blood Glucose: No results found for this or any previous visit (from the past 24 hours).    Tobacco Use History[1]  Goals Met:  Proper associated with RPD/PD & O2 Sat Independence with exercise equipment Improved SOB with ADL's Using PLB without cueing & demonstrates good technique Exercise tolerated well No report of concerns or symptoms today Strength training completed today  Goals Unmet:  Not Applicable  Comments: Pt able to follow exercise prescription today without complaint.  Will continue to monitor for progression.        [1]  Social History Tobacco Use  Smoking Status Former   Current packs/day: 0.00   Average packs/day: 0.5 packs/day for 50.0 years (25.0 ttl pk-yrs)   Types: Cigarettes   Start date: 85   Quit date: 2005   Years since quitting: 20.9   Passive exposure: Never  Smokeless Tobacco Never

## 2024-09-06 ENCOUNTER — Encounter (HOSPITAL_COMMUNITY)

## 2024-09-07 ENCOUNTER — Encounter (HOSPITAL_COMMUNITY): Payer: Self-pay | Admitting: Emergency Medicine

## 2024-09-07 ENCOUNTER — Other Ambulatory Visit: Payer: Self-pay

## 2024-09-07 ENCOUNTER — Emergency Department (HOSPITAL_COMMUNITY)

## 2024-09-07 ENCOUNTER — Emergency Department (HOSPITAL_COMMUNITY)
Admission: EM | Admit: 2024-09-07 | Discharge: 2024-09-07 | Disposition: A | Discharge status: Home / Self Care | Attending: Emergency Medicine | Admitting: Emergency Medicine

## 2024-09-07 DIAGNOSIS — R0602 Shortness of breath: Secondary | ICD-10-CM | POA: Insufficient documentation

## 2024-09-07 DIAGNOSIS — J449 Chronic obstructive pulmonary disease, unspecified: Secondary | ICD-10-CM | POA: Diagnosis not present

## 2024-09-07 DIAGNOSIS — Z79899 Other long term (current) drug therapy: Secondary | ICD-10-CM | POA: Insufficient documentation

## 2024-09-07 DIAGNOSIS — Z951 Presence of aortocoronary bypass graft: Secondary | ICD-10-CM | POA: Insufficient documentation

## 2024-09-07 DIAGNOSIS — R079 Chest pain, unspecified: Secondary | ICD-10-CM

## 2024-09-07 DIAGNOSIS — R072 Precordial pain: Secondary | ICD-10-CM | POA: Insufficient documentation

## 2024-09-07 LAB — BASIC METABOLIC PANEL WITH GFR
Anion gap: 15 (ref 5–15)
BUN: 20 mg/dL (ref 8–23)
CO2: 23 mmol/L (ref 22–32)
Calcium: 9.6 mg/dL (ref 8.9–10.3)
Chloride: 106 mmol/L (ref 98–111)
Creatinine, Ser: 1.01 mg/dL (ref 0.61–1.24)
GFR, Estimated: >60 mL/min
Glucose, Bld: 129 mg/dL — ABNORMAL HIGH (ref 70–99)
Potassium: 3.8 mmol/L (ref 3.5–5.1)
Sodium: 144 mmol/L (ref 135–145)

## 2024-09-07 LAB — CBC WITH DIFFERENTIAL/PLATELET
Abs Immature Granulocytes: 0.08 K/uL — ABNORMAL HIGH (ref 0.00–0.07)
Basophils Absolute: 0.1 K/uL (ref 0.0–0.1)
Basophils Relative: 1 %
Eosinophils Absolute: 0.2 K/uL (ref 0.0–0.5)
Eosinophils Relative: 3 %
HCT: 49.6 % (ref 39.0–52.0)
Hemoglobin: 16.8 g/dL (ref 13.0–17.0)
Immature Granulocytes: 1 %
Lymphocytes Relative: 15 %
Lymphs Abs: 1.2 K/uL (ref 0.7–4.0)
MCH: 32.3 pg (ref 26.0–34.0)
MCHC: 33.9 g/dL (ref 30.0–36.0)
MCV: 95.4 fL (ref 80.0–100.0)
Monocytes Absolute: 0.6 K/uL (ref 0.1–1.0)
Monocytes Relative: 8 %
Neutro Abs: 5.8 K/uL (ref 1.7–7.7)
Neutrophils Relative %: 72 %
Platelets: 280 K/uL (ref 150–400)
RBC: 5.2 MIL/uL (ref 4.22–5.81)
RDW: 13.4 % (ref 11.5–15.5)
WBC: 8.1 K/uL (ref 4.0–10.5)
nRBC: 0 % (ref 0.0–0.2)

## 2024-09-07 LAB — PRO BRAIN NATRIURETIC PEPTIDE: Pro Brain Natriuretic Peptide: 97.3 pg/mL

## 2024-09-07 LAB — RESP PANEL BY RT-PCR (RSV, FLU A&B, COVID)  RVPGX2
Influenza A by PCR: NEGATIVE
Influenza B by PCR: NEGATIVE
Resp Syncytial Virus by PCR: NEGATIVE
SARS Coronavirus 2 by RT PCR: NEGATIVE

## 2024-09-07 LAB — TROPONIN T, HIGH SENSITIVITY
Troponin T High Sensitivity: <15 ng/L (ref 0–19)
Troponin T High Sensitivity: <15 ng/L (ref 0–19)

## 2024-09-07 MED ORDER — FENTANYL CITRATE (PF) 100 MCG/2ML IJ SOLN
50.0000 ug | Freq: Once | INTRAMUSCULAR | Status: AC
  Administered 2024-09-07: 50 ug via INTRAVENOUS
  Filled 2024-09-07: qty 2

## 2024-09-07 MED ORDER — SODIUM CHLORIDE 0.9 % IV BOLUS
500.0000 mL | Freq: Once | INTRAVENOUS | Status: AC
  Administered 2024-09-07: 500 mL via INTRAVENOUS

## 2024-09-07 MED ORDER — NITROGLYCERIN 0.4 MG SL SUBL
0.4000 mg | SUBLINGUAL_TABLET | SUBLINGUAL | Status: DC | PRN
  Administered 2024-09-07: 0.4 mg via SUBLINGUAL
  Filled 2024-09-07: qty 1

## 2024-09-07 NOTE — ED Provider Notes (Signed)
 "  [place] [facility] AT [facility]  Provider Note  CSN: [id number] Arrival date & time: 09/07/24 0131  History Chief Complaint  Patient presents with   Chest Pain    Benjamin [name] is a 83 y.o. 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 Persistent for several hours and not improved with home oxygen  or nebs so EMS was called. Denies cough or fever, has been feeling at baseline the last few days.    Home Medications Prior to Admission medications  Medication Sig Start Date End Date Taking? Authorizing Provider  acetaminophen  (TYLENOL ) 500 MG tablet Take 500 mg by mouth every 8 (eight) hours as needed for mild pain (pain score 1-3).    [provider]  Albuterol -Budesonide  (AIRSUPRA ) 90-80 MCG/ACT AERO Inhale 2 puffs into the lungs every 4 (four) hours as needed. 07/30/24   Cari Arlean HERO, FNP  COLESTID  1 g tablet Take 2 g by mouth 2 (two) times daily.    [provider]  Cranberry 400 MG TABS Take 2 tablets by mouth 2 (two) times daily.    [provider]  Ensifentrine  3 MG/2.5ML SUSP Inhale 3 mg into the lungs in the morning and at bedtime.    [provider]  Evolocumab  (REPATHA  SURECLICK) 140 MG/ML SOAJ INJECT 140MG  SUBCUTANEOUSLY EVERY 14 DAYS AS DIRECTED BY MD 01/05/24   Mona Vinie BROCKS, MD  finasteride  (PROSCAR ) 5 MG tablet Take 1 tablet by mouth once daily 08/23/24   Patel, Rutwik K, MD  Fluticasone -Umeclidin-Vilant (TRELEGY ELLIPTA ) 200-62.5-25 MCG/ACT AEPB USE 1 INHALATION ORALLY IN THE MORNING 04/28/24   Iva Marty Saltness, MD  furosemide  (LASIX ) 20 MG tablet Take 1 tablet (20 mg total) by mouth daily as needed for fluid or edema (for swelling). Patient not taking: Reported on 08/24/2024 08/01/23 08/24/24  Miriam Norris, NP  ipratropium (ATROVENT ) 0.06 % nasal spray Place 2 sprays into both nostrils every 12  (twelve) hours.    [provider]  levalbuterol  (XOPENEX  HFA) 45 MCG/ACT inhaler Inhale 1-2 puffs into the lungs every 4 (four) hours as needed for wheezing.    [provider]  metoprolol  tartrate (LOPRESSOR ) 25 MG tablet Take 1/2 (one-half) tablet by mouth twice daily Patient taking differently: Take 12.5 mg by mouth 2 (two) times daily. 12/04/23   Debera Jayson MATSU, MD  nitroGLYCERIN  (NITROSTAT ) 0.4 MG SL tablet Place 1 tablet (0.4 mg total) under the tongue every 5 (five) minutes as needed for chest pain. Patient not taking: Reported on 08/24/2024 03/28/23 08/24/24  Miriam Norris, NP  OXYGEN  Inhale 2-3 L into the lungs daily as needed (for breathing).    [provider]  pantoprazole  (PROTONIX ) 40 MG tablet Take 1 tablet (40 mg total) by mouth 2 (two) times daily. 08/30/24   Tobie Suzzane POUR, MD  potassium chloride  SA (KLOR-CON  M) 20 MEQ tablet Take 20 mEq by mouth daily.    [provider]  tamsulosin  (FLOMAX ) 0.4 MG CAPS capsule TAKE 2 CAPSULES BY MOUTH ONCE DAILY AFTER SUPPER FOR PROSTATE Patient taking differently: Take 0.8 mg by mouth daily after supper. TAKE 2 CAPSULES BY MOUTH ONCE DAILY AFTER SUPPER FOR PROSTATE 06/04/24   Tobie Suzzane POUR, MD  traMADol  (ULTRAM ) 50 MG tablet Take 1 tablet (50 mg total) by mouth every 12 (twelve) hours as needed. 06/01/24   Tobie Suzzane POUR, MD     Allergies  Arsenic, Contrast media [iodinated contrast media], Nitrofurantoin  monohyd macro, and Statins   Review of Systems   Review of Systems Please see HPI for pertinent positives and negatives  Physical Exam BP 97/69   Pulse 89   Temp 97.7 F (36.5 C) (Oral)   Resp 13   Ht 5' 11 (1.803 m)   Wt 82.1 kg   SpO2 96%   BMI 25.24 kg/m   Physical Exam Vitals and nursing note reviewed.  Constitutional:      Appearance: Normal appearance.  HENT:     Head: Normocephalic and atraumatic.     Nose: Nose normal.     Mouth/Throat:     Mouth: Mucous membranes are  moist.  Eyes:     Extraocular Movements: Extraocular movements intact.     Conjunctiva/sclera: Conjunctivae normal.  Cardiovascular:     Rate and Rhythm: Normal rate.  Pulmonary:     Effort: Pulmonary effort is normal.     Breath sounds: Normal breath sounds. No decreased breath sounds, wheezing or rales.  Abdominal:     General: Abdomen is flat.     Palpations: Abdomen is soft.     Tenderness: There is no abdominal tenderness.  Musculoskeletal:        General: No swelling. Normal range of motion.     Cervical back: Neck supple.     Right lower leg: No edema.     Left lower leg: No edema.  Skin:    General: Skin is warm and dry.  Neurological:     General: No focal deficit present.     Mental Status: He is alert.  Psychiatric:        Mood and Affect: Mood normal.     ED Results / Procedures / Treatments   EKG EKG Interpretation Date/Time:  Tuesday September 07 2024 01:37:41 EST Ventricular Rate:  109 PR Interval:  152 QRS Duration:  104 QT Interval:  355 QTC Calculation: 478 R Axis:   66  Text Interpretation: Sinus tachycardia Probable left atrial enlargement Low voltage, precordial leads RSR' in V1 or V2, right VCD or RVH Minimal ST elevation, inferior leads Borderline prolonged QT interval Since last tracing Rate faster Confirmed by Roselyn Dunnings [phone]) on 09/07/2024 1:38:31 AM  Procedures Procedures  Medications Ordered in the ED Medications  nitroGLYCERIN  (NITROSTAT ) SL tablet 0.4 mg (0.4 mg Sublingual Given 09/07/24 0207)  sodium chloride  0.9 % bolus 500 mL (0 mLs Intravenous Stopped 09/07/24 0339)  fentaNYL  (SUBLIMAZE ) injection 50 mcg (50 mcg Intravenous Given 09/07/24 0241)    Initial Impression and Plan  Patient here with chest pain and SOB. Not febrile today, mildly tachycardic. Will check labs, CXR. NTG for chest pain.   ED Course   Clinical Course as of 09/07/24 0401  Tue Sep 07, 2024  0214 CBC is normal.  [CS]  0214 I personally viewed the  images from radiology studies and agree with radiologist interpretation: CXR with atelectasis vs pneumonia. Not currently having infectious symptoms and WBC normal.  [CS]  0228 BMP, Trop and BNP are unremarkable. BP is borderline low after first NTG. Will give some IVF, fentanyl  for pain. [CS]  0314 Covid/Flu/RSV swab is neg.  [CS]  0357 Delta trop remains neg. Patient sleeping comfortably on re-assessment, states his symptoms have resolved and he is feeling better. Given his risk factors, recommend admission for further evaluation of his pain but he is adamant he wants to go home. Recommend he follow up with PCP in the next few  days and RTED for any other concerns in the meantime.  [CS]    Clinical Course User Index [CS] Roselyn Carlin NOVAK, MD     MDM Rules/Calculators/A&P Medical Decision Making Problems Addressed: Chest pain, unspecified type: acute illness or injury  Amount and/or Complexity of Data Reviewed Labs: ordered. Decision-making details documented in ED Course. Radiology: ordered and independent interpretation performed. Decision-making details documented in ED Course. ECG/medicine tests: ordered and independent interpretation performed. Decision-making details documented in ED Course.  Risk Prescription drug management. Parenteral controlled substances. Decision regarding hospitalization.     Final Clinical Impression(s) / ED Diagnoses Final diagnoses:  Chest pain, unspecified type    Rx / DC Orders ED Discharge Orders     None        Roselyn Carlin NOVAK, MD 09/07/24 [phone]  "

## 2024-09-07 NOTE — ED Triage Notes (Signed)
 Pt c/o chest pain and sob that started around 1900.

## 2024-09-08 ENCOUNTER — Telehealth: Payer: Self-pay | Admitting: Internal Medicine

## 2024-09-08 ENCOUNTER — Encounter (HOSPITAL_COMMUNITY): Admission: RE | Admit: 2024-09-08 | Discharge: 2024-09-08 | Disposition: A | Source: Ambulatory Visit

## 2024-09-08 DIAGNOSIS — J449 Chronic obstructive pulmonary disease, unspecified: Secondary | ICD-10-CM | POA: Diagnosis not present

## 2024-09-08 NOTE — Progress Notes (Signed)
 Daily Session Note  Patient Details  Name: Benjamin [name] MRN: [id number] Date of Birth: 1941/05/04 Referring Provider:   Flowsheet Row CARDIAC REHAB PHASE II ORIENTATION from 08/11/2019 in [facility] CARDIAC REHABILITATION  Referring Provider Charls    Encounter Date: 09/08/2024  Check In:  Session Check In - 09/08/24 1100       Check-In   Supervising physician immediately available to respond to emergencies See telemetry face sheet for immediately available MD    Location [facility]    Staff Present Adrien Louder, RN, BSN;Jessica Vonzell, MA, RCEP, CCRP, CCET;Heather Con, [place], Exercise Physiologist    Virtual Visit No    Medication changes reported     No    Fall or balance concerns reported    No    Warm-up and Cool-down Performed on first and last piece of equipment    Resistance Training Performed Yes    VAD Patient? No    PAD/SET Patient? No      Pain Assessment   Currently in Pain? No/denies    Pain Score 0-No pain    Multiple Pain Sites No          Capillary Blood Glucose: No results found for this or any previous visit (from the past 24 hours).    Tobacco Use History[1]  Goals Met:  Proper associated with RPD/PD & O2 Sat Independence with exercise equipment Using PLB without cueing & demonstrates good technique Exercise tolerated well No report of concerns or symptoms today Strength training completed today  Goals Unmet:  Not Applicable  Comments: Pt able to follow exercise prescription today without complaint.  Will continue to monitor for progression.        [1]  Social History Tobacco Use  Smoking Status Former   Current packs/day: 0.00   Average packs/day: 0.5 packs/day for 50.0 years (25.0 ttl pk-yrs)   Types: Cigarettes   Start date: 34   Quit date: 2005   Years since quitting: 20.9   Passive exposure: Never  Smokeless Tobacco Never

## 2024-09-08 NOTE — Telephone Encounter (Signed)
 Patient spouse came by office , patient was in the ER 2 days ago she is asking for Dr Tobie to speak to his pulmonary dr before this appointment about his COPD having chest pain was not a heart attack it was COPD>

## 2024-09-10 ENCOUNTER — Encounter (HOSPITAL_COMMUNITY)

## 2024-09-13 ENCOUNTER — Ambulatory Visit: Payer: Medicare PPO | Admitting: Urology

## 2024-09-13 ENCOUNTER — Encounter (HOSPITAL_COMMUNITY)

## 2024-09-14 ENCOUNTER — Other Ambulatory Visit: Payer: Self-pay

## 2024-09-14 ENCOUNTER — Encounter (HOSPITAL_COMMUNITY): Payer: Self-pay | Admitting: *Deleted

## 2024-09-14 ENCOUNTER — Inpatient Hospital Stay: Admission: RE | Admit: 2024-09-14 | Discharge: 2024-09-14

## 2024-09-14 VITALS — BP 111/68 | HR 75 | Temp 98.0°F | Resp 21

## 2024-09-14 DIAGNOSIS — J441 Chronic obstructive pulmonary disease with (acute) exacerbation: Secondary | ICD-10-CM

## 2024-09-14 DIAGNOSIS — J449 Chronic obstructive pulmonary disease, unspecified: Secondary | ICD-10-CM

## 2024-09-14 DIAGNOSIS — Z20822 Contact with and (suspected) exposure to covid-19: Secondary | ICD-10-CM | POA: Diagnosis not present

## 2024-09-14 DIAGNOSIS — R0602 Shortness of breath: Secondary | ICD-10-CM | POA: Diagnosis not present

## 2024-09-14 LAB — POC SOFIA SARS ANTIGEN FIA: SARS Coronavirus 2 Ag: NEGATIVE

## 2024-09-14 MED ORDER — PREDNISONE 20 MG PO TABS: 40.0000 mg | ORAL_TABLET | Freq: Every day | ORAL | 0 refills | Status: AC

## 2024-09-14 NOTE — Discharge Instructions (Signed)
 The COVID test was negative. You may take over-the-counter Tylenol  as needed for pain, fever, or general discomfort. Continue your current medications for your COPD.  Recommend use of your albuterol  inhaler and nebulizer machine as needed. Recommend use of a humidifier in your bedroom at nighttime during sleep and sleeping elevated on pillows while symptoms persist. Make sure you are wearing your mask when you are around your wife and that she also has her mask on. Go to the emergency department if you experience worsening shortness of breath, difficulty breathing, or other concerns. Follow-up with pulmonology as scheduled. Follow-up as needed.
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
## 2024-09-14 NOTE — ED Triage Notes (Signed)
 Pt is here with SOB stating it comes from his COPD but is here to be tested for COVID as his wife tested POSITIVE yesterday. Pt denies any sxs today.

## 2024-09-14 NOTE — Progress Notes (Signed)
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   potassium chloride  SA (KLOR-CON  M) 20 MEQ tablet, Take 20 mEq by mouth daily., Disp: , Rfl:    tamsulosin  (FLOMAX ) 0.4 MG CAPS capsule, TAKE 2 CAPSULES BY MOUTH ONCE DAILY AFTER SUPPER FOR PROSTATE (Patient taking differently: Take 0.8 mg by mouth daily after supper. TAKE 2 CAPSULES BY MOUTH ONCE DAILY AFTER SUPPER FOR PROSTATE), Disp: 180 capsule, Rfl: 3   traMADol  (ULTRAM ) 50 MG tablet, Take 1 tablet (50 mg total) by mouth every 12 (twelve) hours as needed., Disp: 30 tablet, Rfl: 0  Current Facility-Administered Medications:    dupilumab  (DUPIXENT ) prefilled syringe 300 mg, 300 mg, Subcutaneous, Q14 Days, Iva Marty Saltness, MD, 300 mg at 11/17/23 1423 [2]  Social History Tobacco Use  Smoking Status Former   Current packs/day: 0.00   Average packs/day: 0.5 packs/day for 50.0 years  (25.0 ttl pk-yrs)   Types: Cigarettes   Start date: 66   Quit date: 2005   Years since quitting: 21.0   Passive exposure: Never  Smokeless Tobacco Never

## 2024-09-15 ENCOUNTER — Encounter (HOSPITAL_COMMUNITY)

## 2024-09-17 ENCOUNTER — Encounter (HOSPITAL_COMMUNITY)

## 2024-09-20 ENCOUNTER — Encounter (HOSPITAL_COMMUNITY)

## 2024-09-22 ENCOUNTER — Encounter (HOSPITAL_COMMUNITY)

## 2024-09-23 ENCOUNTER — Encounter: Payer: Self-pay | Admitting: Internal Medicine

## 2024-09-23 ENCOUNTER — Ambulatory Visit: Payer: Self-pay | Admitting: Internal Medicine

## 2024-09-23 VITALS — BP 101/69 | HR 92 | Ht 71.0 in | Wt 185.4 lb

## 2024-09-23 DIAGNOSIS — I5032 Chronic diastolic (congestive) heart failure: Secondary | ICD-10-CM

## 2024-09-23 DIAGNOSIS — Z20822 Contact with and (suspected) exposure to covid-19: Secondary | ICD-10-CM | POA: Diagnosis not present

## 2024-09-23 DIAGNOSIS — Z09 Encounter for follow-up examination after completed treatment for conditions other than malignant neoplasm: Secondary | ICD-10-CM | POA: Diagnosis not present

## 2024-09-23 DIAGNOSIS — J4489 Other specified chronic obstructive pulmonary disease: Secondary | ICD-10-CM | POA: Diagnosis not present

## 2024-09-23 DIAGNOSIS — J9611 Chronic respiratory failure with hypoxia: Secondary | ICD-10-CM | POA: Diagnosis not present

## 2024-09-23 NOTE — Assessment & Plan Note (Signed)
 ER chart reviewed, including imaging and blood tests Likely had COPD exacerbation, symptoms improved now

## 2024-09-23 NOTE — Patient Instructions (Signed)
 Please continue to take medications as prescribed.  Please continue to follow low carb diet and perform moderate exercise/walking as tolerated.

## 2024-09-23 NOTE — Assessment & Plan Note (Signed)
 Overall well-controlled with Trelegy now Has stopped using Ohtuvayre  nebulizer now Airsupra  inhaler as needed for dyspnea or wheezing Has oxygen  desaturation upon ambulation, has home O2 for as needed use/upon ambulation Followed by pulmonology and Allergy  specialist Obtain COVID test as his wife recently had COVID - needs negative test to resume pulmonary rehab

## 2024-09-23 NOTE — Assessment & Plan Note (Addendum)
 Likely due to COPD O2 sat at rest: 92% O2 sat upon ambulation: 86% O2 sat with O2 upon ambulation: 94%  Was recently worse due to multifocal pneumonia/COPD exacerbation - better now, has home O2 for ambulation

## 2024-09-23 NOTE — Assessment & Plan Note (Signed)
 Has a history of HFpEF Has mild leg swelling Has had cardiology evaluation Has Lasix  as needed for leg swelling or dyspnea - advised to take Lasix  twice in a week for now, potassium supplement with Lasix  to avoid hypokalemia related cramping

## 2024-09-24 ENCOUNTER — Encounter (HOSPITAL_COMMUNITY)

## 2024-09-25 LAB — COVID-19, FLU A+B AND RSV
Influenza A, NAA: NOT DETECTED
Influenza B, NAA: NOT DETECTED
RSV, NAA: NOT DETECTED
SARS-CoV-2, NAA: NOT DETECTED

## 2024-09-27 ENCOUNTER — Encounter (HOSPITAL_COMMUNITY)

## 2024-09-29 ENCOUNTER — Encounter (HOSPITAL_COMMUNITY)

## 2024-09-29 ENCOUNTER — Encounter: Payer: Self-pay | Admitting: Emergency Medicine

## 2024-09-29 ENCOUNTER — Ambulatory Visit
Admission: EM | Admit: 2024-09-29 | Discharge: 2024-09-29 | Disposition: A | Discharge status: Home / Self Care | Attending: Family Medicine | Admitting: Family Medicine

## 2024-09-29 DIAGNOSIS — J441 Chronic obstructive pulmonary disease with (acute) exacerbation: Secondary | ICD-10-CM

## 2024-09-29 DIAGNOSIS — U071 COVID-19: Secondary | ICD-10-CM | POA: Diagnosis not present

## 2024-09-29 LAB — POC COVID19/FLU A&B COMBO
Covid Antigen, POC: POSITIVE — AB
Influenza A Antigen, POC: NEGATIVE
Influenza B Antigen, POC: NEGATIVE

## 2024-09-29 MED ORDER — AZITHROMYCIN 250 MG PO TABS: ORAL_TABLET | ORAL | 0 refills | Status: DC

## 2024-09-29 MED ORDER — PAXLOVID (300/100) 20 X 150 MG & 10 X 100MG PO TBPK: 3.0000 | ORAL_TABLET | Freq: Two times a day (BID) | ORAL | 0 refills | Status: AC

## 2024-09-29 MED ORDER — BENZONATATE 200 MG PO CAPS: 200.0000 mg | ORAL_CAPSULE | Freq: Three times a day (TID) | ORAL | 0 refills | Status: DC | PRN

## 2024-09-29 NOTE — Discharge Instructions (Addendum)
 In addition to the prescribed medications, you may take Coricidin HBP, plain Mucinex , use saline sinus rinses, humidifiers and continue your inhaler regimen.  Follow-up for worsening or unresolving symptoms.

## 2024-09-29 NOTE — ED Triage Notes (Signed)
 Body aches, cough, vomiting x 1  x 3 days.  Has been taking robitussin.
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
## 2024-10-01 ENCOUNTER — Encounter (HOSPITAL_COMMUNITY)

## 2024-10-04 ENCOUNTER — Encounter (HOSPITAL_COMMUNITY)

## 2024-10-06 ENCOUNTER — Encounter (HOSPITAL_COMMUNITY)

## 2024-10-11 ENCOUNTER — Ambulatory Visit: Payer: Medicare PPO

## 2024-10-12 ENCOUNTER — Encounter (HOSPITAL_COMMUNITY): Payer: Self-pay | Admitting: *Deleted

## 2024-10-12 DIAGNOSIS — J449 Chronic obstructive pulmonary disease, unspecified: Secondary | ICD-10-CM
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
## 2024-10-13 ENCOUNTER — Ambulatory Visit (HOSPITAL_COMMUNITY)

## 2024-10-15 ENCOUNTER — Ambulatory Visit (HOSPITAL_COMMUNITY)

## 2024-10-18 ENCOUNTER — Ambulatory Visit (HOSPITAL_COMMUNITY)

## 2024-10-20 ENCOUNTER — Ambulatory Visit (HOSPITAL_COMMUNITY)

## 2024-10-20 ENCOUNTER — Telehealth: Payer: Self-pay | Admitting: Internal Medicine

## 2024-10-20 ENCOUNTER — Ambulatory Visit: Admitting: Cardiology

## 2024-10-20 ENCOUNTER — Emergency Department (HOSPITAL_COMMUNITY)

## 2024-10-20 ENCOUNTER — Encounter (HOSPITAL_COMMUNITY): Payer: Self-pay

## 2024-10-20 ENCOUNTER — Observation Stay (HOSPITAL_COMMUNITY): Admission: EM | Admit: 2024-10-20 | Source: Home / Self Care

## 2024-10-20 ENCOUNTER — Other Ambulatory Visit: Payer: Self-pay

## 2024-10-20 DIAGNOSIS — M51362 Other intervertebral disc degeneration, lumbar region with discogenic back pain and lower extremity pain: Secondary | ICD-10-CM

## 2024-10-20 DIAGNOSIS — R29898 Other symptoms and signs involving the musculoskeletal system: Secondary | ICD-10-CM | POA: Diagnosis not present

## 2024-10-20 DIAGNOSIS — K219 Gastro-esophageal reflux disease without esophagitis: Secondary | ICD-10-CM | POA: Diagnosis not present

## 2024-10-20 DIAGNOSIS — W19XXXA Unspecified fall, initial encounter: Secondary | ICD-10-CM

## 2024-10-20 DIAGNOSIS — Y92009 Unspecified place in unspecified non-institutional (private) residence as the place of occurrence of the external cause: Secondary | ICD-10-CM | POA: Diagnosis not present

## 2024-10-20 DIAGNOSIS — E782 Mixed hyperlipidemia: Secondary | ICD-10-CM

## 2024-10-20 DIAGNOSIS — G373 Acute transverse myelitis in demyelinating disease of central nervous system: Secondary | ICD-10-CM | POA: Diagnosis present

## 2024-10-20 DIAGNOSIS — N401 Enlarged prostate with lower urinary tract symptoms: Secondary | ICD-10-CM

## 2024-10-20 DIAGNOSIS — R7303 Prediabetes: Secondary | ICD-10-CM | POA: Diagnosis not present

## 2024-10-20 DIAGNOSIS — I5032 Chronic diastolic (congestive) heart failure: Secondary | ICD-10-CM | POA: Diagnosis not present

## 2024-10-20 DIAGNOSIS — J449 Chronic obstructive pulmonary disease, unspecified: Secondary | ICD-10-CM | POA: Diagnosis not present

## 2024-10-20 DIAGNOSIS — N400 Enlarged prostate without lower urinary tract symptoms: Secondary | ICD-10-CM | POA: Diagnosis not present

## 2024-10-20 LAB — CBC WITH DIFFERENTIAL/PLATELET
Abs Immature Granulocytes: 0.11 10*3/uL — ABNORMAL HIGH (ref 0.00–0.07)
Basophils Absolute: 0.1 10*3/uL (ref 0.0–0.1)
Basophils Relative: 1 %
Eosinophils Absolute: 0.1 10*3/uL (ref 0.0–0.5)
Eosinophils Relative: 2 %
HCT: 49.2 % (ref 39.0–52.0)
Hemoglobin: 16.4 g/dL (ref 13.0–17.0)
Immature Granulocytes: 1 %
Lymphocytes Relative: 12 %
Lymphs Abs: 1.1 10*3/uL (ref 0.7–4.0)
MCH: 31.8 pg (ref 26.0–34.0)
MCHC: 33.3 g/dL (ref 30.0–36.0)
MCV: 95.5 fL (ref 80.0–100.0)
Monocytes Absolute: 0.5 10*3/uL (ref 0.1–1.0)
Monocytes Relative: 6 %
Neutro Abs: 6.6 10*3/uL (ref 1.7–7.7)
Neutrophils Relative %: 78 %
Platelets: 213 10*3/uL (ref 150–400)
RBC: 5.15 MIL/uL (ref 4.22–5.81)
RDW: 13.3 % (ref 11.5–15.5)
WBC: 8.5 10*3/uL (ref 4.0–10.5)
nRBC: 0 % (ref 0.0–0.2)

## 2024-10-20 LAB — BASIC METABOLIC PANEL WITH GFR
Anion gap: 18 — ABNORMAL HIGH (ref 5–15)
BUN: 15 mg/dL (ref 8–23)
CO2: 21 mmol/L — ABNORMAL LOW (ref 22–32)
Calcium: 9.9 mg/dL (ref 8.9–10.3)
Chloride: 101 mmol/L (ref 98–111)
Creatinine, Ser: 1.05 mg/dL (ref 0.61–1.24)
GFR, Estimated: >60 mL/min
Glucose, Bld: 113 mg/dL — ABNORMAL HIGH (ref 70–99)
Potassium: 4.2 mmol/L (ref 3.5–5.1)
Sodium: 141 mmol/L (ref 135–145)

## 2024-10-20 LAB — CK: Total CK: 37 U/L — ABNORMAL LOW (ref 49–397)

## 2024-10-20 MED ORDER — LIDOCAINE HCL (PF) 1 % IJ SOLN
5.0000 mL | INTRAMUSCULAR | Status: AC
  Administered 2024-10-20: 5 mL

## 2024-10-20 NOTE — Plan of Care (Signed)
 On call neurology note  I was called by [facility] ER APP regarding this patient who had a fall today while taking out groceries from his car due to his legs giving out and has no sensation below the knees bilaterally.  According to them he has patellar reflexes on the left but not very certain if he is areflexic below that.  MRI L-spine is unremarkable I recommend at this point to proceed with head imaging to ensure there is no contraindication for an LP and then proceed with an LP. Send his CSF for: - Cell count and tube 1 and 4 - CSF glucose and protein - Meningitis/encephalitis panel - Oligoclonal bands - IgG index I would have him admitted at [facility] for a formal neurological evaluation in person.   Eligio Lav, MD Neurology

## 2024-10-20 NOTE — Telephone Encounter (Signed)
In provider folder

## 2024-10-20 NOTE — Addendum Note (Signed)
 Addended byBETHA TOBIE DOWNS on: 10/20/2024 06:17 PM   Modules accepted: Orders

## 2024-10-20 NOTE — Telephone Encounter (Signed)
 Patient would like to continue to keep his oxygen . Letter has been  Copied Noted Original placed in provider box (Dr Tobie) Copy placed front desk folder

## 2024-10-20 NOTE — ED Provider Notes (Signed)
 " [place] EMERGENCY DEPARTMENT AT [facility] Provider Note   CSN: [id number] Arrival date & time: 10/20/24  1637     Patient presents with: Benjamin   Myles [name] is a 84 y.o. male patient who presents to the emergency department today for evaluation of a mechanical slip and fall that occurred just prior to arrival.  Patient states he got the car was try to get something out of the trunk when he slipped and fell backwards.  He laid on the ground for approximately 1 hour until EMS got there to get him up.  He does endorse some bilateral lower extremity weakness and numbness.  Wife called EMS.  No bowel or bladder incontinence.    Fall       Prior to Admission medications  Medication Sig Start Date End Date Taking? Authorizing Provider  acetaminophen  (TYLENOL ) 500 MG tablet Take 500 mg by mouth every 8 (eight) hours as needed for mild pain (pain score 1-3).    [provider]  Albuterol -Budesonide  (AIRSUPRA ) 90-80 MCG/ACT AERO Inhale 2 puffs into the lungs every 4 (four) hours as needed. 07/30/24   Cari Arlean HERO, FNP  azithromycin  (ZITHROMAX ) 250 MG tablet Take first 2 tablets together, then 1 every day until finished. 09/29/24   Stuart Vernell Norris, PA-C  benzonatate  (TESSALON ) 200 MG capsule Take 1 capsule (200 mg total) by mouth 3 (three) times daily as needed for cough. 09/29/24   Stuart Vernell Norris, PA-C  COLESTID  1 g tablet Take 2 g by mouth 2 (two) times daily.    [provider]  Cranberry 400 MG TABS Take 2 tablets by mouth 2 (two) times daily.    [provider]  Ensifentrine  3 MG/2.5ML SUSP Inhale 3 mg into the lungs in the morning and at bedtime.    [provider]  Evolocumab  (REPATHA  SURECLICK) 140 MG/ML SOAJ INJECT 140MG  SUBCUTANEOUSLY EVERY 14 DAYS AS DIRECTED BY MD 01/05/24   Mona Vinie BROCKS, MD  finasteride  (PROSCAR ) 5 MG tablet Take 1 tablet by mouth once daily 08/23/24   Patel, Rutwik K, MD  Fluticasone -Umeclidin-Vilant  (TRELEGY ELLIPTA ) 200-62.5-25 MCG/ACT AEPB USE 1 INHALATION ORALLY IN THE MORNING 04/28/24   Iva Marty Saltness, MD  furosemide  (LASIX ) 20 MG tablet Take 1 tablet (20 mg total) by mouth daily as needed for fluid or edema (for swelling). 08/01/23 09/23/24  Miriam Norris, NP  ipratropium (ATROVENT ) 0.06 % nasal spray Place 2 sprays into both nostrils every 12 (twelve) hours.    [provider]  levalbuterol  (XOPENEX  HFA) 45 MCG/ACT inhaler Inhale 1-2 puffs into the lungs every 4 (four) hours as needed for wheezing.    [provider]  metoprolol  tartrate (LOPRESSOR ) 25 MG tablet Take 1/2 (one-half) tablet by mouth twice daily Patient taking differently: Take 12.5 mg by mouth 2 (two) times daily. 12/04/23   Debera Jayson MATSU, MD  nitroGLYCERIN  (NITROSTAT ) 0.4 MG SL tablet Place 1 tablet (0.4 mg total) under the tongue every 5 (five) minutes as needed for chest pain. 03/28/23 09/23/24  Miriam Norris, NP  OXYGEN  Inhale 2-3 L into the lungs daily as needed (for breathing).    [provider]  pantoprazole  (PROTONIX ) 40 MG tablet Take 1 tablet (40 mg total) by mouth 2 (two) times daily. 08/30/24   Tobie Suzzane POUR, MD  potassium chloride  SA (KLOR-CON  M) 20 MEQ tablet Take 20 mEq by mouth daily.    [provider]  tamsulosin  (FLOMAX ) 0.4 MG CAPS capsule TAKE 2  CAPSULES BY MOUTH ONCE DAILY AFTER SUPPER FOR PROSTATE Patient taking differently: Take 0.8 mg by mouth daily after supper. TAKE 2 CAPSULES BY MOUTH ONCE DAILY AFTER SUPPER FOR PROSTATE 06/04/24   Tobie Suzzane POUR, MD  traMADol  (ULTRAM ) 50 MG tablet Take 1 tablet (50 mg total) by mouth every 12 (twelve) hours as needed. 06/01/24   Tobie Suzzane POUR, MD    Allergies: Arsenic, Contrast media [iodinated contrast media], Nitrofurantoin  monohyd macro, and Statins    Review of Systems  All other systems reviewed and are negative.   Updated Vital Signs BP 122/82 Comment: [place] 4 L/M  Pulse 77   Resp 18   Ht 5' 11 (1.803  m)   Wt 82.6 kg   SpO2 96%   BMI 25.38 kg/m   Physical Exam Vitals and nursing note reviewed.  Constitutional:      General: He is not in acute distress.    Appearance: Normal appearance.  HENT:     Head: Normocephalic and atraumatic.  Eyes:     General:        Right eye: No discharge.        Left eye: No discharge.  Cardiovascular:     Comments: Regular rate and rhythm.  S1/S2 are distinct without any evidence of murmur, rubs, or gallops.  Radial pulses are 2+ bilaterally.  Dorsalis pedis pulses are 2+ bilaterally.  No evidence of pedal edema. Pulmonary:     Comments: Clear to auscultation bilaterally.  Normal effort.  No respiratory distress.  No evidence of wheezes, rales, or rhonchi heard throughout. Abdominal:     General: Abdomen is flat. Bowel sounds are normal. There is no distension.     Tenderness: There is no abdominal tenderness. There is no guarding or rebound.  Musculoskeletal:        General: Normal range of motion.     Cervical back: Neck supple.     Comments: Patient unable to keep his legs up at the knee bilaterally.  Can spontaneously flex at the hip bilaterally briefly.  Decreased subjective sensation to the lower extremities bilaterally.  Skin:    General: Skin is warm and dry.     Findings: No rash.  Neurological:     General: No focal deficit present.     Mental Status: He is alert.  Psychiatric:        Mood and Affect: Mood normal.        Behavior: Behavior normal.     (all labs ordered are listed, but only abnormal results are displayed) Labs Reviewed  CBC WITH DIFFERENTIAL/PLATELET - Abnormal; Notable for the following components:      Result Value   Abs Immature Granulocytes 0.11 (*)    All other components within normal limits  BASIC METABOLIC PANEL WITH GFR - Abnormal; Notable for the following components:   CO2 21 (*)    Glucose, Bld 113 (*)    Anion gap 18 (*)    All other components within normal limits  CK - Abnormal; Notable for the  following components:   Total CK 37 (*)    All other components within normal limits    EKG: None  Radiology: DG HIP UNILAT WITH PELVIS 2-3 VIEWS LEFT Result Date: 10/20/2024 CLINICAL DATA:  Clemens, inability to bear weight EXAM: DG HIP (WITH OR WITHOUT PELVIS) 2-3V LEFT; DG HIP (WITH OR WITHOUT PELVIS) 2-3V RIGHT COMPARISON:  04/18/2017 FINDINGS: Frontal view of the pelvis as well as frontal and frogleg lateral views of  both hips are obtained. On the left, no fracture, subluxation, or dislocation. Mild osteoarthritis. On the right, no fracture, subluxation, or dislocation. Mild osteoarthritis. The remainder of the bony pelvis is unremarkable. Sacroiliac joints are normal. IMPRESSION: 1. Symmetrical bilateral hip osteoarthritis. 2. No acute fracture. Electronically Signed   By: Ozell Daring M.D.   On: 10/20/2024 17:49   DG HIP UNILAT WITH PELVIS 2-3 VIEWS RIGHT Result Date: 10/20/2024 CLINICAL DATA:  Clemens, inability to bear weight EXAM: DG HIP (WITH OR WITHOUT PELVIS) 2-3V LEFT; DG HIP (WITH OR WITHOUT PELVIS) 2-3V RIGHT COMPARISON:  04/18/2017 FINDINGS: Frontal view of the pelvis as well as frontal and frogleg lateral views of both hips are obtained. On the left, no fracture, subluxation, or dislocation. Mild osteoarthritis. On the right, no fracture, subluxation, or dislocation. Mild osteoarthritis. The remainder of the bony pelvis is unremarkable. Sacroiliac joints are normal. IMPRESSION: 1. Symmetrical bilateral hip osteoarthritis. 2. No acute fracture. Electronically Signed   By: Ozell Daring M.D.   On: 10/20/2024 17:49     Procedures   Medications Ordered in the ED - No data to display  Clinical Course as of 10/20/24 1856  Wed Oct 20, 2024  1806 CBC with Differential(!) Normal.  [CF]  1806 Basic metabolic panel(!) Normal.  [CF]  1806 CK(!) Negative.  [CF]    Clinical Course User Index [CF] Theotis Cameron HERO, PA-C    Medical Decision Making Kadarious Dikes is a 84 y.o. male patient  who presents to the emergency department today for further evaluation of a mechanical slip and fall at his car.  Patient's neurological exam does not necessarily fit cauda equina as he does have some sensation above the knee but decreased subjective sensation to the lower extremities below the knee.  Patient able to lift his legs but when flexes at the hip he says it is painful.  It is painful at the hips not in the back.  He has no back pain.  Given the presentation I will get imaging over the hips bilaterally and get an MRI of the lumbar spine to rule out any cauda equina syndrome.  I have a low suspicion for epidural abscess at this time.  Workup is still pending.  MRI was completed but still waiting to get read by radiology.  I do not see any obvious cord compression but still waiting on the read. Due to shift change, the rest of the patient's care of will be transferred to oncoming provider. If MRI is negative, the patient should be walked to assess gait. If this is normal, he can likely go home. Again disposition pending.    Amount and/or Complexity of Data Reviewed Labs: ordered. Decision-making details documented in ED Course. Radiology: ordered.    Final diagnoses:  None    ED Discharge Orders     None          Theotis Cameron HERO, [place] 10/20/24 1856  "

## 2024-10-20 NOTE — Telephone Encounter (Signed)
 error
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
## 2024-10-20 NOTE — ED Provider Notes (Signed)
 " Physical Exam  BP 122/82 Comment: [place] 4 L/M  Pulse 77   Resp 18   Ht 5' 11 (1.803 m)   Wt 82.6 kg   SpO2 96%   BMI 25.38 kg/m   Physical Exam Vitals and nursing note reviewed.  Constitutional:      General: He is not in acute distress.    Appearance: He is well-developed.  HENT:     Head: Normocephalic and atraumatic.  Eyes:     Conjunctiva/sclera: Conjunctivae normal.  Cardiovascular:     Rate and Rhythm: Normal rate and regular rhythm.     Heart sounds: No murmur heard. Pulmonary:     Effort: Pulmonary effort is normal. No respiratory distress.     Breath sounds: Normal breath sounds.  Abdominal:     Palpations: Abdomen is soft.     Tenderness: There is no abdominal tenderness.  Musculoskeletal:        General: No swelling.     Cervical back: Neck supple.  Skin:    General: Skin is warm and dry.     Capillary Refill: Capillary refill takes less than 2 seconds.  Neurological:     Mental Status: He is alert.  Psychiatric:        Mood and Affect: Mood normal.     Procedures  Lumbar Puncture  Date/Time: 10/20/2024 10:34 PM  Performed by: Suellen Sherran LABOR, PA-C Authorized by: Suellen Sherran LABOR, PA-C   Consent:    Consent obtained:  Verbal and written   Consent given by:  Patient   Risks discussed:  Bleeding, infection, pain, nerve damage, headache and repeat procedure Universal protocol:    Procedure explained and questions answered to patient or proxy's satisfaction: yes     Test results available: yes     Imaging studies available: yes     Patient identity confirmed:  Verbally with patient Pre-procedure details:    Procedure purpose:  Diagnostic   Preparation: Patient was prepped and draped in usual sterile fashion   Anesthesia:    Anesthesia method:  Local infiltration   Local anesthetic:  5 mL lidocaine  (PF) 1 % Procedure details:    Lumbar space:  L3-L4 interspace   Patient position:  L lateral decubitus   Needle gauge:  20   Needle type:  Spinal  needle - Quincke tip   Ultrasound guidance: no     Number of attempts:  1   Fluid appearance:  Blood-tinged then clearing   Tubes of fluid:  4   Total volume (ml):  4 Post-procedure details:    Puncture site:  Adhesive bandage applied   Procedure completion:  Tolerated well, no immediate complications   ED Course / MDM   Clinical Course as of 10/20/24 1854  Wed Oct 20, 2024  1806 CBC with Differential(!) Normal.  [CF]  1806 Basic metabolic panel(!) Normal.  [CF]  1806 CK(!) Negative.  [CF]    Clinical Course User Index [CF] Theotis Cameron HERO, PA-C   Medical Decision Making Amount and/or Complexity of Data Reviewed Labs: ordered. Decision-making details documented in ED Course. Radiology: ordered.  Risk Decision regarding hospitalization.   Handoff taken from [employer], PA-C. With bilateral leg weakness and numbness from knees down, patient states this suddenly started today.  He states his legs were feeling somewhat weak already to today, noticed this when he was driving home from the grocery store, when he got out of the car to get to his trunk and get his groceries out  he states suddenly he felt like his legs were not there had no sensation from his knees down and this caused him to fall.  He has no back pain or hip pain, denies head injury or loss of consciousness, labs are reassuring, signed out to me pending MRI L-spine results, bilateral hip x-rays were negative, the L-spine did show right subarticular disc protrusion at L2-L3 resulting in lateral recess narrowing and contact with traversing right L3 nerve root and mild stenosis at L4-L5 and some atrophy of psoas muscles likely due to osteoarthritis given he does have degenerative changes on x-rays.  Patient still has very decreased sensation, he can feel temperature and feels some sharp sensation but otherwise cannot feel light touch and cannot independently lift up his legs.   Given his persistent weakness and  numbness I consulted neurology and spoke with Dr. Voncile.  Discussion of patient's presentation and exam findings, he recommended head CT to rule out mass and to then proceed negative with a lumbar puncture and admit to hospitalist service at [facility] so patient can be seen by in person neurology.  Discussed with Dr. Adefeso for hospitalist admission        Suellen Sherran LABOR, PA-C 10/20/24 2325  "

## 2024-10-20 NOTE — ED Triage Notes (Signed)
 Pt BIB ems after having a fall in the driveway. Wife called EMS because pt is unable to bear weight on legs or move legs. Pt reports unable to feel staff touching his legs bilaterally below knees. Bilateral pedal pulses found with doppler.

## 2024-10-21 LAB — CSF CELL COUNT WITH DIFFERENTIAL
RBC Count, CSF: 13 /mm3 — ABNORMAL HIGH
RBC Count, CSF: 777 /mm3 — ABNORMAL HIGH
Tube #: 1
Tube #: 4
WBC, CSF: 3 /mm3 (ref 0–5)
WBC, CSF: 5 /mm3 (ref 0–5)

## 2024-10-21 LAB — MENINGITIS/ENCEPHALITIS PANEL (CSF)
Cryptococcus neoformans/gattii (CSF): NOT DETECTED
Cytomegalovirus (CSF): NOT DETECTED
Enterovirus (CSF): NOT DETECTED
Escherichia coli K1 (CSF): NOT DETECTED
Haemophilus influenzae (CSF): NOT DETECTED
Herpes simplex virus 1 (CSF): NOT DETECTED
Herpes simplex virus 2 (CSF): NOT DETECTED
Human herpesvirus 6 (CSF): DETECTED — AB
Human parechovirus (CSF): NOT DETECTED
Listeria monocytogenes (CSF): NOT DETECTED
Neisseria meningitis (CSF): NOT DETECTED
Streptococcus agalactiae (CSF): NOT DETECTED
Streptococcus pneumoniae (CSF): NOT DETECTED
Varicella zoster virus (CSF): NOT DETECTED

## 2024-10-21 LAB — COMPREHENSIVE METABOLIC PANEL WITH GFR
ALT: 21 U/L (ref 0–44)
AST: 19 U/L (ref 15–41)
Albumin: 3.6 g/dL (ref 3.5–5.0)
Alkaline Phosphatase: 43 U/L (ref 38–126)
Anion gap: 13 (ref 5–15)
BUN: 14 mg/dL (ref 8–23)
CO2: 22 mmol/L (ref 22–32)
Calcium: 9.2 mg/dL (ref 8.9–10.3)
Chloride: 108 mmol/L (ref 98–111)
Creatinine, Ser: 0.94 mg/dL (ref 0.61–1.24)
GFR, Estimated: >60 mL/min
Glucose, Bld: 122 mg/dL — ABNORMAL HIGH (ref 70–99)
Potassium: 4 mmol/L (ref 3.5–5.1)
Sodium: 143 mmol/L (ref 135–145)
Total Bilirubin: 1 mg/dL (ref 0.0–1.2)
Total Protein: 5.9 g/dL — ABNORMAL LOW (ref 6.5–8.1)

## 2024-10-21 LAB — MAGNESIUM: Magnesium: 2 mg/dL (ref 1.7–2.4)

## 2024-10-21 LAB — PHOSPHORUS: Phosphorus: 3.3 mg/dL (ref 2.5–4.6)

## 2024-10-21 LAB — CBC
HCT: 44.2 % (ref 39.0–52.0)
Hemoglobin: 14.8 g/dL (ref 13.0–17.0)
MCH: 31.6 pg (ref 26.0–34.0)
MCHC: 33.5 g/dL (ref 30.0–36.0)
MCV: 94.2 fL (ref 80.0–100.0)
Platelets: 206 10*3/uL (ref 150–400)
RBC: 4.69 MIL/uL (ref 4.22–5.81)
RDW: 13.4 % (ref 11.5–15.5)
WBC: 8.6 10*3/uL (ref 4.0–10.5)
nRBC: 0 % (ref 0.0–0.2)

## 2024-10-21 LAB — PROTEIN AND GLUCOSE, CSF
Glucose, CSF: 67 mg/dL (ref 40–70)
Total  Protein, CSF: 59 mg/dL — ABNORMAL HIGH (ref 15–45)

## 2024-10-21 MED ORDER — ONDANSETRON HCL 4 MG PO TABS: 4.0000 mg | ORAL_TABLET | Freq: Four times a day (QID) | ORAL | Status: AC | PRN

## 2024-10-21 MED ORDER — ALBUTEROL-BUDESONIDE 90-80 MCG/ACT IN AERO: 2.0000 | INHALATION_SPRAY | RESPIRATORY_TRACT | Status: DC | PRN

## 2024-10-21 MED ORDER — COLESTIPOL HCL 1 G PO TABS
2.0000 g | ORAL_TABLET | Freq: Two times a day (BID) | ORAL | Status: AC | PRN
  Administered 2024-10-21: 1 g via ORAL
  Filled 2024-10-21 (×2): qty 2

## 2024-10-21 MED ORDER — FINASTERIDE 5 MG PO TABS
5.0000 mg | ORAL_TABLET | Freq: Every day | ORAL | Status: AC
  Administered 2024-10-21 – 2024-10-22 (×2): 5 mg via ORAL
  Filled 2024-10-21 (×2): qty 1

## 2024-10-21 MED ORDER — ENOXAPARIN SODIUM 40 MG/0.4ML IJ SOSY
40.0000 mg | PREFILLED_SYRINGE | INTRAMUSCULAR | Status: AC
  Administered 2024-10-21 – 2024-10-22 (×2): 40 mg via SUBCUTANEOUS
  Filled 2024-10-21 (×2): qty 0.4

## 2024-10-21 MED ORDER — PANTOPRAZOLE SODIUM 40 MG PO TBEC
40.0000 mg | DELAYED_RELEASE_TABLET | Freq: Two times a day (BID) | ORAL | Status: AC
  Administered 2024-10-21 – 2024-10-22 (×5): 40 mg via ORAL
  Filled 2024-10-21 (×5): qty 1

## 2024-10-21 MED ORDER — VITAMIN D 25 MCG (1000 UNIT) PO TABS
1000.0000 [IU] | ORAL_TABLET | Freq: Every day | ORAL | Status: AC
  Administered 2024-10-21 – 2024-10-22 (×2): 1000 [IU] via ORAL
  Filled 2024-10-21 (×2): qty 1

## 2024-10-21 MED ORDER — ACETAMINOPHEN 650 MG RE SUPP: 650.0000 mg | Freq: Four times a day (QID) | RECTAL | Status: AC | PRN

## 2024-10-21 MED ORDER — TRAMADOL HCL 50 MG PO TABS
50.0000 mg | ORAL_TABLET | Freq: Two times a day (BID) | ORAL | Status: AC | PRN
  Administered 2024-10-22: 50 mg via ORAL
  Filled 2024-10-21: qty 1

## 2024-10-21 MED ORDER — COLESTIPOL HCL 1 G PO TABS
2.0000 g | ORAL_TABLET | Freq: Two times a day (BID) | ORAL | Status: DC
  Filled 2024-10-21 (×3): qty 2

## 2024-10-21 MED ORDER — ONDANSETRON HCL 4 MG/2ML IJ SOLN: 4.0000 mg | Freq: Four times a day (QID) | INTRAMUSCULAR | Status: AC | PRN

## 2024-10-21 MED ORDER — BUDESON-GLYCOPYRROL-FORMOTEROL 160-9-4.8 MCG/ACT IN AERO
2.0000 | INHALATION_SPRAY | Freq: Two times a day (BID) | RESPIRATORY_TRACT | Status: DC
  Filled 2024-10-21: qty 5.9

## 2024-10-21 MED ORDER — METOPROLOL TARTRATE 12.5 MG HALF TABLET
12.5000 mg | ORAL_TABLET | Freq: Two times a day (BID) | ORAL | Status: AC
  Administered 2024-10-21 – 2024-10-22 (×5): 12.5 mg via ORAL
  Filled 2024-10-21 (×5): qty 1

## 2024-10-21 MED ORDER — ALBUTEROL SULFATE (2.5 MG/3ML) 0.083% IN NEBU: 2.5000 mg | INHALATION_SOLUTION | RESPIRATORY_TRACT | Status: AC | PRN

## 2024-10-21 MED ORDER — IPRATROPIUM BROMIDE 0.06 % NA SOLN
2.0000 | Freq: Two times a day (BID) | NASAL | Status: AC
  Administered 2024-10-21 – 2024-10-22 (×3): 2 via NASAL
  Filled 2024-10-21 (×2): qty 15

## 2024-10-21 MED ORDER — TAMSULOSIN HCL 0.4 MG PO CAPS
0.8000 mg | ORAL_CAPSULE | Freq: Every day | ORAL | Status: AC
  Administered 2024-10-21 – 2024-10-22 (×2): 0.8 mg via ORAL
  Filled 2024-10-21 (×3): qty 2

## 2024-10-21 MED ORDER — ACETAMINOPHEN 325 MG PO TABS
650.0000 mg | ORAL_TABLET | Freq: Four times a day (QID) | ORAL | Status: AC | PRN
  Administered 2024-10-21 – 2024-10-22 (×2): 650 mg via ORAL
  Filled 2024-10-21 (×2): qty 2

## 2024-10-21 MED ORDER — BUDESON-GLYCOPYRROL-FORMOTEROL 160-9-4.8 MCG/ACT IN AERO
2.0000 | INHALATION_SPRAY | Freq: Two times a day (BID) | RESPIRATORY_TRACT | Status: AC
  Administered 2024-10-22: 2 via RESPIRATORY_TRACT
  Filled 2024-10-21 (×2): qty 5.9

## 2024-10-21 NOTE — ED Notes (Signed)
Report given to [facility] 

## 2024-10-21 NOTE — Progress Notes (Signed)
 " PROGRESS NOTE    Benjamin [name]  FMW:[id number] DOB: 04-May-1941 DOA: 10/20/2024 PCP: Tobie Suzzane POUR, MD  Subjective: No acute events overnight. Seen and examined at bedside. Reports ongoing bilateral leg weakness and sensory loss. No other deficits noted. No trauma reported. Denies nausea, vomiting.   Hospital Course:  84 y.o. male with medical history significant of hyperlipidemia, CAD status post CABG, COPD, BPH, diastolic CHF, GERD, degenerative disc disease, chronic respiratory failure with hypoxia (not compliant with home oxygen -4L) who presents to the emergency department via EMS after sustaining a fall while taking out groceries from his car.  Patient states that his legs gave up on him and had loss of sensation bilaterally in both legs from the knee downwards.  Patient was unable to get up from the ground, wife activated EMS and patient was taken to the ED for further evaluation. Admitted for acute bilateral leg weakness and numbness of unclear etiology.   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 As per neurology, this is unlikely to explain patient's symptoms.  Neurologist (Dr. Deedra) was consulted by ED PA and recommended admitting the patient to [facility] for in person evaluation Continue fall precaution Continue PT/OT eval and treat Please notify neurology team when patient arrives to [facility]   BPH Continue Flomax  and Proscar .   COPD Continue Albuterol -Budesonide , Breztri     Chronic diastolic congestive heart failure Coronary artery disease status post CABG Continue beta blocker per home regimen   Degenerative disc disease Continue home tramadol    GERD Continue Protonix    Mixed hyperlipidemia Patient is on Repatha .   Prediabetes A1c 6.0 (05/24/2024) Diet and lifestyle modification  DVT  prophylaxis: enoxaparin  (LOVENOX ) injection 40 mg Start: 10/21/24 0800 SCDs Start: 10/21/24 0043  Lovenox    Code Status: Full Code  Disposition Plan: Transfer to [facility] Reason for continuing need for hospitalization: severity of illness, awaiting transfer to [facility]  Objective: Vitals:   10/21/24 0622 10/21/24 0818 10/21/24 0820 10/21/24 1142  BP: (!) 106/91 123/87  (!) 99/58  Pulse: 87 79 79 88  Resp: (!) 22 20    Temp: 98.4 F (36.9 C)   97.8 F (36.6 C)  TempSrc: Oral   Oral  SpO2: 90% 90% 90% 90%  Weight:      Height:       No intake or output data in the 24 hours ending 10/21/24 1158 Filed Weights   10/20/24 1652  Weight: 82.6 kg    Examination:  Physical Exam Vitals and nursing note reviewed.  Constitutional:      General: He is not in acute distress.    Appearance: He is ill-appearing.     Comments: Weak, frail  HENT:     Head: Normocephalic and atraumatic.  Cardiovascular:     Rate and Rhythm: Normal rate and regular rhythm.     Pulses: Normal pulses.     Heart sounds: Normal heart sounds.  Pulmonary:     Effort: Pulmonary effort is normal.     Breath sounds: Normal breath sounds.  Abdominal:     General: Bowel sounds are normal.     Palpations: Abdomen is soft.  Musculoskeletal:     Right lower leg: No edema.     Left lower leg: No edema.  Neurological:     Mental Status: He is alert and oriented to  person, place, and time.     Sensory: Sensory deficit (bilateral leg) present.     Motor: Weakness (bilateral leg) present.     Data Reviewed: I have personally reviewed following labs and imaging studies  CBC: Recent Labs  Lab 10/20/24 1711 10/21/24 0535  WBC 8.5 8.6  NEUTROABS 6.6  --   HGB 16.4 14.8  HCT 49.2 44.2  MCV 95.5 94.2  PLT 213 206   Basic Metabolic Panel: Recent Labs  Lab 10/20/24 1711 10/21/24 0535  NA 141 143  K 4.2 4.0  CL 101 108  CO2 21* 22  GLUCOSE 113* 122*  BUN 15 14  CREATININE 1.05 0.94  CALCIUM  9.9 9.2   MG  --  2.0  PHOS  --  3.3   GFR: Estimated Creatinine Clearance: 64.5 mL/min (by C-G formula based on SCr of 0.94 mg/dL). Liver Function Tests: Recent Labs  Lab 10/21/24 0535  AST 19  ALT 21  ALKPHOS 43  BILITOT 1.0  PROT 5.9*  ALBUMIN  3.6   No results for input(s): LIPASE, AMYLASE in the last 168 hours. No results for input(s): AMMONIA in the last 168 hours. Coagulation Profile: No results for input(s): INR, PROTIME in the last 168 hours. Cardiac Enzymes: Recent Labs  Lab 10/20/24 1711  CKTOTAL 37*   ProBNP, BNP (last 5 results) Recent Labs    05/12/24 1617 08/16/24 1551 09/07/24 0150  PROBNP 38.0 390.0* 97.3   HbA1C: No results for input(s): HGBA1C in the last 72 hours. CBG: No results for input(s): GLUCAP in the last 168 hours. Lipid Profile: No results for input(s): CHOL, HDL, LDLCALC, TRIG, CHOLHDL, LDLDIRECT in the last 72 hours. Thyroid  Function Tests: No results for input(s): TSH, T4TOTAL, FREET4, T3FREE, THYROIDAB in the last 72 hours. Anemia Panel: No results for input(s): VITAMINB12, FOLATE, FERRITIN, TIBC, IRON, RETICCTPCT in the last 72 hours. Sepsis Labs: No results for input(s): PROCALCITON, LATICACIDVEN in the last 168 hours.  Recent Results (from the past 240 hours)  CSF culture w Gram Stain     Status: None (Preliminary result)   Collection Time: 10/20/24 11:52 PM   Specimen: Lumbar Puncture; Cerebrospinal Fluid  Result Value Ref Range Status   Specimen Description LUMBAR  Final   Special Requests NONE  Final   Gram Stain   Final    NO WBC SEEN NO ORGANISMS SEEN Performed at [facility], [address], [place], [place] [address]    Culture PENDING  Incomplete   Report Status PENDING  Incomplete     Radiology Studies: CT Head Wo Contrast Result Date: 10/20/2024 CLINICAL DATA:  Status post fall.  Leg numbness/weakness. EXAM: CT HEAD WITHOUT CONTRAST TECHNIQUE: Contiguous axial  images were obtained from the base of the skull through the vertex without intravenous contrast. RADIATION DOSE REDUCTION: This exam was performed according to the departmental dose-optimization program which includes automated exposure control, adjustment of the mA and/or kV according to patient size and/or use of iterative reconstruction technique. COMPARISON:  None Available. FINDINGS: Brain: No intracranial hemorrhage, mass effect, or midline shift. Age related atrophy. No hydrocephalus. The basilar cisterns are patent. Mild periventricular and deep white matter hypodensity typical of chronic small vessel ischemia. No evidence of territorial infarct or acute ischemia. No extra-axial or intracranial fluid collection. Vascular: Atherosclerosis of skullbase vasculature without hyperdense vessel or abnormal calcification. Skull: No fracture or focal lesion. Sinuses/Orbits: Mild paranasal sinus mucosal thickening. No sinus fluid levels. No mastoid effusion. Bilateral cataract resection. Other: None. IMPRESSION: 1. No  acute intracranial abnormality. No skull fracture. 2. Age related atrophy and chronic small vessel ischemia. Electronically Signed   By: Andrea Gasman M.D.   On: 10/20/2024 20:33   MR LUMBAR SPINE WO CONTRAST Result Date: 10/20/2024 EXAM: MRI LUMBAR SPINE 10/20/2024 05:59:05 PM TECHNIQUE: Multiplanar multisequence MRI of the lumbar spine was performed without the administration of intravenous contrast. COMPARISON: MRI lumbar spine 16 2014. CLINICAL HISTORY: Low back pain, cauda equina syndrome suspected. FINDINGS: BONES AND ALIGNMENT: Normal alignment. Normal vertebral body heights are maintained. No bone marrow edema or evidence of acute fracture. Small hemangioma at the L2 vertebral body. The conus medullaris extends to the L1 level. SPINAL CORD: The conus medullaris extends to the L1 level. SOFT TISSUES: No paraspinal mass. There is atrophy of the psoas muscles slightly greater on the right which  could be associated with hip osteoarthritis consider correlation with dedicated hip radiographs. Subcentimeter probable cyst in the left kidney. There are diverticuli along the partially visualized sigmoid colon. T12-L1: No significant spinal canal or foraminal stenosis. L1-L2: No significant spinal canal or foraminal stenosis. L2-L3: Mild disc height loss. Small disc bulge. Mild facet arthrosis and thickening of the ligamentum flavum. There is a right subarticular disc protrusion resulting in lateral recess narrowing which appears to contact the traversing right L3 nerve root. Additional small left foraminal disc protrusion. There is mild foraminal stenosis on the left. L3-L4: Small disc bulge. Small left foraminal disc protrusion. Mild to moderate facet arthrosis and thickening of the ligamentum flavum. No significant spinal canal stenosis. No significant foraminal stenosis. L4-L5: Mild disc height loss. Subtle grade 1 anterolisthesis with partial uncovering of the disc. Diffuse disc bulge causing lateral recess narrowing without significant nerve root impingement. Moderate to severe facet arthrosis with bilateral facet effusions. Thickening of the ligamentum flavum. Mild spinal canal stenosis. There is no significant foraminal stenosis. L5-S1: Small disc bulge. Moderate bilateral facet arthrosis with facet effusion noted on the right. No significant spinal canal stenosis. There is no significant foraminal stenosis. IMPRESSION: 1. No high-grade spinal canal stenosis in the lumbar spine. 2. Right subarticular disc protrusion at L2-L3 resulting in lateral recess narrowing and contact with the traversing right L3 nerve root. 3. Mild spinal canal stenosis at L4-L5. 4. Atrophy of the psoas muscles asymmetrically more pronounced on the right. Findings could reflect hip pathology such as osteoarthritis. Consider correlation with dedicated hip radiographs. Electronically signed by: Donnice Mania MD 10/20/2024 07:04 PM EST  RP Workstation: [id number]   DG HIP UNILAT WITH PELVIS 2-3 VIEWS LEFT Result Date: 10/20/2024 CLINICAL DATA:  Clemens, inability to bear weight EXAM: DG HIP (WITH OR WITHOUT PELVIS) 2-3V LEFT; DG HIP (WITH OR WITHOUT PELVIS) 2-3V RIGHT COMPARISON:  04/18/2017 FINDINGS: Frontal view of the pelvis as well as frontal and frogleg lateral views of both hips are obtained. On the left, no fracture, subluxation, or dislocation. Mild osteoarthritis. On the right, no fracture, subluxation, or dislocation. Mild osteoarthritis. The remainder of the bony pelvis is unremarkable. Sacroiliac joints are normal. IMPRESSION: 1. Symmetrical bilateral hip osteoarthritis. 2. No acute fracture. Electronically Signed   By: Ozell Daring M.D.   On: 10/20/2024 17:49   DG HIP UNILAT WITH PELVIS 2-3 VIEWS RIGHT Result Date: 10/20/2024 CLINICAL DATA:  Clemens, inability to bear weight EXAM: DG HIP (WITH OR WITHOUT PELVIS) 2-3V LEFT; DG HIP (WITH OR WITHOUT PELVIS) 2-3V RIGHT COMPARISON:  04/18/2017 FINDINGS: Frontal view of the pelvis as well as frontal and frogleg lateral views of both hips are obtained.  On the left, no fracture, subluxation, or dislocation. Mild osteoarthritis. On the right, no fracture, subluxation, or dislocation. Mild osteoarthritis. The remainder of the bony pelvis is unremarkable. Sacroiliac joints are normal. IMPRESSION: 1. Symmetrical bilateral hip osteoarthritis. 2. No acute fracture. Electronically Signed   By: Ozell Daring M.D.   On: 10/20/2024 17:49    Scheduled Meds:  budesonide -glycopyrrolate -formoterol   2 puff Inhalation BID   dupilumab   300 mg Subcutaneous Q14 Days   enoxaparin  (LOVENOX ) injection  40 mg Subcutaneous Q24H   finasteride   5 mg Oral Daily   lidocaine  (PF)  5 mL     metoprolol  tartrate  12.5 mg Oral BID   pantoprazole   40 mg Oral BID   tamsulosin   0.8 mg Oral QPC supper   Continuous Infusions:   LOS: 1 day   Norval Bar, MD  [facility]  10/21/2024, 11:58 AM   "

## 2024-10-21 NOTE — ED Notes (Signed)
 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

## 2024-10-21 NOTE — ED Notes (Signed)
[facility] called for transport to [place]. 

## 2024-10-21 NOTE — Progress Notes (Signed)
" °   10/21/24 0944  TOC Brief Assessment  Insurance and Status Reviewed  Patient has primary care physician Yes  Home environment has been reviewed From Home  Prior level of function: Independent  Prior/Current Home Services No current home services  Social Drivers of Health Review SDOH reviewed interventions complete  Readmission risk has been reviewed Yes  Transition of care needs transition of care needs identified, TOC will continue to follow   Per MD patient should be transferring [facility]. ICM to continue to follow for needs.  "

## 2024-10-21 NOTE — Care Management Obs Status (Signed)
 MEDICARE OBSERVATION STATUS NOTIFICATION   Patient Details  Name: Benjamin [name] MRN: [id number] Date of Birth: 01-30-1941   Medicare Observation Status Notification Given:  Yes    Sharlyne Stabs, RN 10/21/2024, 2:52 PM

## 2024-10-21 NOTE — ED Notes (Signed)
 Clara (wife) was notified of patient heading to [employer].

## 2024-10-21 NOTE — Care Management CC44 (Signed)
"         Condition Code 44 Documentation Completed  Patient Details  Name: Benjamin [name] MRN: [id number] Date of Birth: 06-30-1941   Condition Code 44 given:  Yes Patient signature on Condition Code 44 notice:  Yes Documentation of 2 MD's agreement:  Yes Code 44 added to claim:  Yes  Signed and copy given.  Sharlyne Stabs, RN 10/21/2024, 2:52 PM  "

## 2024-10-22 ENCOUNTER — Observation Stay (HOSPITAL_COMMUNITY)

## 2024-10-22 ENCOUNTER — Inpatient Hospital Stay (HOSPITAL_COMMUNITY): Admission: RE | Admit: 2024-10-22 | Source: Ambulatory Visit

## 2024-10-22 ENCOUNTER — Other Ambulatory Visit: Payer: Self-pay

## 2024-10-22 DIAGNOSIS — G373 Acute transverse myelitis in demyelinating disease of central nervous system: Secondary | ICD-10-CM | POA: Diagnosis present

## 2024-10-22 LAB — HIV ANTIBODY (ROUTINE TESTING W REFLEX): HIV Screen 4th Generation wRfx: NONREACTIVE

## 2024-10-22 LAB — CSF CULTURE W GRAM STAIN
Culture: NO GROWTH
Gram Stain: NONE SEEN

## 2024-10-22 LAB — TROPONIN T, HIGH SENSITIVITY
Troponin T High Sensitivity: 14 ng/L (ref 0–19)
Troponin T High Sensitivity: 13 ng/L (ref 0–19)

## 2024-10-22 MED ORDER — HYDROXYZINE HCL 25 MG PO TABS
25.0000 mg | ORAL_TABLET | Freq: Once | ORAL | Status: AC
  Administered 2024-10-22: 25 mg via ORAL
  Filled 2024-10-22: qty 1

## 2024-10-22 MED ORDER — SODIUM CHLORIDE 0.9 % IV SOLN
5.0000 mg/kg | Freq: Two times a day (BID) | INTRAVENOUS | Status: AC
  Administered 2024-10-22 (×2): 415 mg via INTRAVENOUS
  Filled 2024-10-22 (×3): qty 8.3

## 2024-10-22 MED ORDER — MORPHINE SULFATE (PF) 2 MG/ML IV SOLN: 0.5000 mg | INTRAVENOUS | Status: AC | PRN

## 2024-10-22 MED ORDER — CHLORHEXIDINE GLUCONATE CLOTH 2 % EX PADS
6.0000 | MEDICATED_PAD | Freq: Every day | CUTANEOUS | Status: AC
  Administered 2024-10-22: 6 via TOPICAL

## 2024-10-22 MED ORDER — ASPIRIN 81 MG PO TBEC
81.0000 mg | DELAYED_RELEASE_TABLET | Freq: Every day | ORAL | Status: AC
  Administered 2024-10-22: 81 mg via ORAL
  Filled 2024-10-22: qty 1

## 2024-10-22 MED ORDER — SODIUM CHLORIDE 0.9% FLUSH: 10.0000 mL | INTRAVENOUS | Status: AC | PRN

## 2024-10-22 NOTE — Progress Notes (Signed)
 Peripherally Inserted Central Catheter Placement  The IV Nurse has discussed with the patient and/or persons authorized to consent for the patient, the purpose of this procedure and the potential benefits and risks involved with this procedure.  The benefits include less needle sticks, lab draws from the catheter, and the patient may be discharged home with the catheter. Risks include, but not limited to, infection, bleeding, blood clot (thrombus formation), and puncture of an artery; nerve damage and irregular heartbeat and possibility to perform a PICC exchange if needed/ordered by physician.  Alternatives to this procedure were also discussed.  Bard Power PICC patient education guide, fact sheet on infection prevention and patient information card has been provided to patient /or left at bedside.    PICC Placement Documentation  PICC Single Lumen 10/22/24 Right Basilic 41 cm 1 cm (Active)  Indication for Insertion or Continuance of Line Limited venous access - need for IV therapy >5 days 10/22/24 1512  Exposed Catheter (cm) 1 cm 10/22/24 1512  Site Assessment Clean, Dry, Intact 10/22/24 1512  Line Status Saline locked;Flushed;Blood return noted 10/22/24 1512  Dressing Type Transparent;Securing device 10/22/24 1512  Dressing Status Antimicrobial disc/dressing in place;Clean, Dry, Intact 10/22/24 1512  Line Care Connections checked and tightened 10/22/24 1512  Line Adjustment (NICU/IV Team Only) No 10/22/24 1512  Dressing Intervention New dressing 10/22/24 1512  Dressing Change Due 10/29/24 10/22/24 1512       [name] 10/22/2024, 3:13 PM

## 2024-10-22 NOTE — Progress Notes (Signed)
 " PROGRESS NOTE    Benjamin [name]  FMW:[id number] DOB: [birth year] DOA: 10/20/2024 PCP: Tobie Suzzane POUR, MD   Brief Narrative: 84 year old with past medical history significant for hyperlipidemia, CAD status post CABG, COPD, BPH, diastolic heart failure, GERD, degenerative disc disease, chronic respiratory failure with hypoxia noncompliant with home oxygen  4 L presents to the ED via EMS after he sustained a fall while taking out groceries from his car.  He reports that his legs gave up and lost sensation bilaterally of both legs.  EMS was activated he was not able to get up from the ground.  Evaluation in the ED, LP was performed, neurologist recommend admission to [facility].   Assessment & Plan:   Principal Problem:   Bilateral leg weakness Active Problems:   Transverse myelitis (HCC)   1-Bilateral leg weakness some numbness Fall; -HHV6 positive - Presented with sudden onset of weakness and fall.  Neurology considering transverse myelitis.  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 - Patient was started on ganciclovir  - Plan to proceed with MRI of brain, cervical spine and thoracic spine Appreciate ID consultation -LP: CSF white blood cell 3, clear in appearance, protein 59, glucose 67, meningitis panel positive for human herpes virus 6 culture no growth today 1, IgG CSF index pending oligoclonal bands pending  BPH: -Continue Flomax  and Proscar    COPD: -Continue budesonide , Beztri, Albuterol .   Chronic diastolic heart failure Coronary artery disease status post CABG -Continue beta-blocker   Degenerative disc disease -Continue tramadol   GERD: - Continue Protonix   Hyperlipidemia -On Repatha  at home  Prediabetes  Chest pain, while on MRI.  He report as chest pressure. Similar to his panic attacks.  EKG sinus. Troponin negative.  Morphine  order.  SBP  110--will hold on nitroglycerine, he wouldn't want nitroglycerin  due to side effects of headaches.      Estimated body mass index is 25.38 kg/m as calculated from the following:   Height as of this encounter: 5' 11 (1.803 m).   Weight as of this encounter: 82.6 kg.   DVT prophylaxis: Lovenox  Code Status: Full code Family Communication: Disposition Plan:  Status is: Observation The patient will require care spanning > 2 midnights and should be moved to inpatient because: management of weakness    Consultants:  Neurology ID  Procedures:  LP  Antimicrobials:    Subjective:\he is alert, report chest pain, presure like. he had similar symptoms in the past thought ot be anxiety. His cardiologist is aware of this episodes.    Objective: Vitals:   10/21/24 1710 10/21/24 1713 10/21/24 1714 10/22/24 0300  BP: 121/81 121/83  115/72  Pulse:  88  88  Resp:  18  18  Temp:  97.8 F (36.6 C)  97.8 F (36.6 C)  TempSrc:  Oral  Oral  SpO2:  (!) 87% 92% 91%  Weight:      Height:        Intake/Output Summary (Last 24 hours) at 10/22/2024 0756 Last data filed at 10/22/2024 0754 Gross per 24 hour  Intake 120 ml  Output 240 ml  Net -120 ml   Filed Weights   10/20/24 1652  Weight: 82.6 kg    Examination:  General exam: Appears calm and comfortable  Respiratory system: Clear to auscultation. Respiratory effort normal. Cardiovascular system: S1 & S2 heard, RRR. No JVD, murmurs, rubs, gallops or clicks. No pedal edema.  Gastrointestinal system: Abdomen is nondistended, soft and nontender. No organomegaly or masses felt. Normal bowel sounds heard.    Data Reviewed: I have personally reviewed following labs and imaging studies  CBC: Recent Labs  Lab 10/20/24 1711 10/21/24 0535  WBC 8.5 8.6  NEUTROABS 6.6  --   HGB 16.4 14.8  HCT 49.2 44.2  MCV 95.5 94.2  PLT 213 206   Basic Metabolic Panel: Recent Labs  Lab 10/20/24 1711 10/21/24 0535  NA 141 143  K 4.2 4.0   CL 101 108  CO2 21* 22  GLUCOSE 113* 122*  BUN 15 14  CREATININE 1.05 0.94  CALCIUM  9.9 9.2  MG  --  2.0  PHOS  --  3.3   GFR: Estimated Creatinine Clearance: 64.5 mL/min (by C-G formula based on SCr of 0.94 mg/dL). Liver Function Tests: Recent Labs  Lab 10/21/24 0535  AST 19  ALT 21  ALKPHOS 43  BILITOT 1.0  PROT 5.9*  ALBUMIN  3.6   No results for input(s): LIPASE, AMYLASE in the last 168 hours. No results for input(s): AMMONIA in the last 168 hours. Coagulation Profile: No results for input(s): INR, PROTIME in the last 168 hours. Cardiac Enzymes: Recent Labs  Lab 10/20/24 1711  CKTOTAL 37*   BNP (last 3 results) Recent Labs    05/12/24 1617 08/16/24 1551 09/07/24 0150  PROBNP 38.0 390.0* 97.3   HbA1C: No results for input(s): HGBA1C in the last 72 hours. CBG: No results for input(s): GLUCAP in the last 168 hours. Lipid Profile: No results for input(s): CHOL, HDL, LDLCALC, TRIG, CHOLHDL, LDLDIRECT in the last 72 hours. Thyroid  Function Tests: No results for input(s): TSH, T4TOTAL, FREET4, T3FREE, THYROIDAB in the last 72 hours. Anemia Panel: No results for input(s): VITAMINB12, FOLATE, FERRITIN, TIBC, IRON, RETICCTPCT in the last 72 hours. Sepsis Labs: No results for input(s): PROCALCITON, LATICACIDVEN in the last 168 hours.  Recent Results (from the past 240 hours)  CSF culture w Gram Stain     Status: None (Preliminary result)   Collection Time: 10/20/24 11:52 PM   Specimen: Lumbar Puncture; Cerebrospinal Fluid  Result Value Ref Range Status   Specimen Description LUMBAR  Final   Special Requests NONE  Final   Gram Stain   Final    NO WBC SEEN NO ORGANISMS SEEN Performed at [facility], [address] [place], [place] [address]    Culture PENDING  Incomplete   Report Status PENDING  Incomplete         Radiology Studies: CT Head Wo Contrast Result Date: 10/20/2024 CLINICAL DATA:  Status  post fall.  Leg numbness/weakness. EXAM: CT HEAD WITHOUT CONTRAST TECHNIQUE: Contiguous axial images were obtained from the base of the skull through the vertex without intravenous contrast. RADIATION DOSE REDUCTION: This exam was performed according to the departmental dose-optimization program which includes automated exposure control, adjustment of the mA and/or kV according to patient size and/or use of iterative reconstruction technique. COMPARISON:  None Available. FINDINGS: Brain: No intracranial hemorrhage, mass effect, or midline shift. Age related atrophy. No hydrocephalus. The basilar cisterns are patent. Mild periventricular and deep white matter hypodensity typical of chronic small vessel ischemia. No evidence of territorial infarct or acute ischemia. No extra-axial or intracranial fluid collection. Vascular: Atherosclerosis of skullbase vasculature without hyperdense vessel or abnormal calcification. Skull: No fracture or focal lesion. Sinuses/Orbits: Mild paranasal sinus mucosal thickening. No sinus fluid levels. No mastoid effusion. Bilateral cataract resection. Other: None. IMPRESSION: 1. No acute intracranial abnormality. No  skull fracture. 2. Age related atrophy and chronic small vessel ischemia. Electronically Signed   By: Andrea Gasman M.D.   On: 10/20/2024 20:33   MR LUMBAR SPINE WO CONTRAST Result Date: 10/20/2024 EXAM: MRI LUMBAR SPINE 10/20/2024 05:59:05 PM TECHNIQUE: Multiplanar multisequence MRI of the lumbar spine was performed without the administration of intravenous contrast. COMPARISON: MRI lumbar spine 16 2014. CLINICAL HISTORY: Low back pain, cauda equina syndrome suspected. FINDINGS: BONES AND ALIGNMENT: Normal alignment. Normal vertebral body heights are maintained. No bone marrow edema or evidence of acute fracture. Small hemangioma at the L2 vertebral body. The conus medullaris extends to the L1 level. SPINAL CORD: The conus medullaris extends to the L1 level. SOFT TISSUES:  No paraspinal mass. There is atrophy of the psoas muscles slightly greater on the right which could be associated with hip osteoarthritis consider correlation with dedicated hip radiographs. Subcentimeter probable cyst in the left kidney. There are diverticuli along the partially visualized sigmoid colon. T12-L1: No significant spinal canal or foraminal stenosis. L1-L2: No significant spinal canal or foraminal stenosis. L2-L3: Mild disc height loss. Small disc bulge. Mild facet arthrosis and thickening of the ligamentum flavum. There is a right subarticular disc protrusion resulting in lateral recess narrowing which appears to contact the traversing right L3 nerve root. Additional small left foraminal disc protrusion. There is mild foraminal stenosis on the left. L3-L4: Small disc bulge. Small left foraminal disc protrusion. Mild to moderate facet arthrosis and thickening of the ligamentum flavum. No significant spinal canal stenosis. No significant foraminal stenosis. L4-L5: Mild disc height loss. Subtle grade 1 anterolisthesis with partial uncovering of the disc. Diffuse disc bulge causing lateral recess narrowing without significant nerve root impingement. Moderate to severe facet arthrosis with bilateral facet effusions. Thickening of the ligamentum flavum. Mild spinal canal stenosis. There is no significant foraminal stenosis. L5-S1: Small disc bulge. Moderate bilateral facet arthrosis with facet effusion noted on the right. No significant spinal canal stenosis. There is no significant foraminal stenosis. IMPRESSION: 1. No high-grade spinal canal stenosis in the lumbar spine. 2. Right subarticular disc protrusion at L2-L3 resulting in lateral recess narrowing and contact with the traversing right L3 nerve root. 3. Mild spinal canal stenosis at L4-L5. 4. Atrophy of the psoas muscles asymmetrically more pronounced on the right. Findings could reflect hip pathology such as osteoarthritis. Consider correlation with  dedicated hip radiographs. Electronically signed by: Donnice Mania MD 10/20/2024 07:04 PM EST RP Workstation: [id number]   DG HIP UNILAT WITH PELVIS 2-3 VIEWS LEFT Result Date: 10/20/2024 CLINICAL DATA:  Clemens, inability to bear weight EXAM: DG HIP (WITH OR WITHOUT PELVIS) 2-3V LEFT; DG HIP (WITH OR WITHOUT PELVIS) 2-3V RIGHT COMPARISON:  04/18/2017 FINDINGS: Frontal view of the pelvis as well as frontal and frogleg lateral views of both hips are obtained. On the left, no fracture, subluxation, or dislocation. Mild osteoarthritis. On the right, no fracture, subluxation, or dislocation. Mild osteoarthritis. The remainder of the bony pelvis is unremarkable. Sacroiliac joints are normal. IMPRESSION: 1. Symmetrical bilateral hip osteoarthritis. 2. No acute fracture. Electronically Signed   By: Ozell Daring M.D.   On: 10/20/2024 17:49   DG HIP UNILAT WITH PELVIS 2-3 VIEWS RIGHT Result Date: 10/20/2024 CLINICAL DATA:  Clemens, inability to bear weight EXAM: DG HIP (WITH OR WITHOUT PELVIS) 2-3V LEFT; DG HIP (WITH OR WITHOUT PELVIS) 2-3V RIGHT COMPARISON:  04/18/2017 FINDINGS: Frontal view of the pelvis as well as frontal and frogleg lateral views of both hips are obtained. On the left, no  fracture, subluxation, or dislocation. Mild osteoarthritis. On the right, no fracture, subluxation, or dislocation. Mild osteoarthritis. The remainder of the bony pelvis is unremarkable. Sacroiliac joints are normal. IMPRESSION: 1. Symmetrical bilateral hip osteoarthritis. 2. No acute fracture. Electronically Signed   By: Ozell Daring M.D.   On: 10/20/2024 17:49        Scheduled Meds:  budesonide -glycopyrrolate -formoterol   2 puff Inhalation BID   cholecalciferol   1,000 Units Oral Daily   enoxaparin  (LOVENOX ) injection  40 mg Subcutaneous Q24H   finasteride   5 mg Oral Daily   ipratropium  2 spray Each Nare Q12H   lidocaine  (PF)  5 mL     metoprolol  tartrate  12.5 mg Oral BID   pantoprazole   40 mg Oral BID   tamsulosin    0.8 mg Oral QPC supper   Continuous Infusions:  ganciclovir        LOS: 1 day    Time spent: 35 Minutes    Shila Kruczek A Krystyna Cleckley, MD [facility]   If 7PM-7AM, please contact night-coverage [email or web address]  10/22/2024, 7:56 AM   "

## 2024-10-22 NOTE — Plan of Care (Signed)
  Problem: Activity: Goal: Risk for activity intolerance will decrease Outcome: Progressing   Problem: Safety: Goal: Ability to remain free from injury will improve Outcome: Progressing   

## 2024-10-22 NOTE — Progress Notes (Signed)
 PT Cancellation Note  Patient Details Name: Perseus Westall MRN: [id number] DOB: [birth year]   Cancelled Treatment:    Reason Eval/Treat Not Completed: Medical issues which prohibited therapy (PT consult appreciated and chart reviewed. Per RN, pt recently returned from MRI and is c/o chest pain. Requested PT hold. Will follow-up for PT evaluation when appropriate and as schedule permits.)  Randall SAUNDERS, PT, DPT Acute Rehabilitation Services Office: [phone] Secure Chat Preferred   Delon CHRISTELLA Callander 10/22/2024, 9:43 AM

## 2024-10-22 NOTE — TOC Progression Note (Signed)
 Transition of Care [facility]) - Progression Note    Patient Details  Name: Benjamin [name] MRN: [id number] Date of Birth: 05/12/1941  [facility] CM/SW Contact  Andrez JULIANNA George, RN Phone Number: 10/22/2024, 2:53 PM  Clinical Narrative:     Current recommendations are for CIR. Awaiting work up.  ICM following.  Expected Discharge Plan: (P)  (TBD) Barriers to Discharge: Continued Medical Work up, Other (must enter comment) (Patient should be transferring to [facility] - MD unsure when)               Expected Discharge Plan and Services                                               Social Drivers of Health (SDOH) Interventions SDOH Screenings   Food Insecurity: No Food Insecurity (10/22/2024)  Housing: Low Risk (10/22/2024)  Transportation Needs: No Transportation Needs (10/22/2024)  Utilities: Not At Risk (10/22/2024)  Alcohol Screen: Low Risk (10/07/2023)  Depression (PHQ2-9): Low Risk (09/23/2024)  Financial Resource Strain: Low Risk (10/07/2023)  Physical Activity: Inactive (10/07/2023)  Social Connections: Socially Integrated (10/22/2024)  Stress: No Stress Concern Present (10/07/2023)  Tobacco Use: Medium Risk (10/20/2024)  Health Literacy: Adequate Health Literacy (10/07/2023)    Readmission Risk Interventions    10/21/2024    9:43 AM 08/18/2024    3:05 PM  Readmission Risk Prevention Plan  Medication Screening  Complete  Transportation Screening Complete Complete  Home Care Screening Complete   Medication Review (RN CM) Complete

## 2024-10-22 NOTE — Progress Notes (Signed)
 OT Cancellation Note  Patient Details Name: Benjamin [name] MRN: [id number] DOB: 08-05-1941   Cancelled Treatment:    Reason Eval/Treat Not Completed: Medical issues which prohibited therapy- pt with chest pain and RN requesting OT to hold at this time.  Will check back as able.   Etta NOVAK, OT Acute Rehabilitation Services Office [phone] Secure Chat Preferred    Etta GORMAN Hope 10/22/2024, 9:42 AM

## 2024-10-22 NOTE — Evaluation (Signed)
 Occupational Therapy Evaluation Patient Details Name: Benjamin [name] MRN: [id number] DOB: [birth year] Today's Date: 10/22/2024   History of Present Illness   Pt is a 84 y.o. M who presented to ED 10/20/24 c/o sudden development of paraplegia and numbness in the legs 3 days ago as well as back pain. He reports a similar episode ~14 years ago associated with a rash on his back which was treated with steroids with resolution of weakness after about 2 weeks. CTH (-) acute intracranial abnormality or skull fracture. Lumbar x-ray demonstrated right subarticular disc protrusion at L2-L3 resulting in lateral recess narrowing and contact with the traversing right L3 nerve root as well as mild spinal canal stenosis at L4-L5. Hip x-ray showed symmetrical bilateral hip osteoarthritis and no acute fracture. 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 PMHx: HLD, CAD s/p CABG, COPD, BPH, diastolic CHF, GERD, DDD, shingles, and chronic respiratory failure with hypoxia (not compliant with home oxygen -4L).     Clinical Impressions PTA patient reports independent with ADLs, mobility using cane vs RW but mostly furniture/wall walking and with daily falls.  Pt admitted for above and presents with problem list below.  He currently requires +2 mod assist for bed mobility and lateral scoots at EOB, setup to total assist +2 for ADLs.  Pt with poor sitting balance, has brief moments of contact guard statically but with attempts to move or engage in Adls pt unable to sustain balance needing up to max assist. Pt impulsive with movement, demonstrates poor safety awareness, and requires cueing throughout session for safety. He does report chest pain, but cleared by MD to work with therapy.   On RA upon entry, and needs 4L for O2 to recover to 91% (is chronically on 4L at home but noncompliant). Based on performance today, believe pt will best benefit from continued OT services acutely and after dc at  an inpatient setting with >3hrs/day to optimize independence, safety and return to PLOF with ADLs and mobility potentially from w/c level.      If plan is discharge home, recommend the following:   Two people to help with walking and/or transfers;Two people to help with bathing/dressing/bathroom;Assistance with cooking/housework;Assist for transportation;Help with stairs or ramp for entrance     Functional Status Assessment   Patient has had a recent decline in their functional status and demonstrates the ability to make significant improvements in function in a reasonable and predictable amount of time.     Equipment Recommendations   Other (comment) (defer)     Recommendations for Other Services   Rehab consult     Precautions/Restrictions   Precautions Precautions: Fall Recall of Precautions/Restrictions: Impaired Precaution/Restrictions Comments: watch O2 Restrictions Weight Bearing Restrictions Per Provider Order: No     Mobility Bed Mobility Overal bed mobility: Needs Assistance Bed Mobility: Rolling, Sidelying to Sit, Sit to Sidelying Rolling: Min assist, Used rails Sidelying to sit: Mod assist, Used rails     Sit to sidelying: Mod assist, +2 for physical assistance, Used rails General bed mobility comments: assist for LEs, sequencing, safety and balance    Transfers Overall transfer level: Needs assistance Equipment used: None Transfers: Bed to chair/wheelchair/BSC            Lateral/Scoot Transfers: Mod assist, +2 physical assistance General transfer comment: lateral scoot at EOB, mod assist +2 with cueing for sequencing and technique      Balance Overall balance assessment: Needs assistance Sitting-balance support: No upper extremity supported, Feet supported,  Bilateral upper extremity supported Sitting balance-Leahy Scale: Poor Sitting balance - Comments: relies on BUE support, brief moments of no UE support statically with contact guard  but overall needing up to max assist dynamically. Postural control:  (all directions)     Standing balance comment: NT                           ADL either performed or assessed with clinical judgement   ADL Overall ADL's : Needs assistance/impaired     Grooming: Sitting;Set up Grooming Details (indicate cue type and reason): sitting supported         Upper Body Dressing : Moderate assistance;Sitting Upper Body Dressing Details (indicate cue type and reason): d/t sitting balance Lower Body Dressing: Total assistance;+2 for physical assistance;Sitting/lateral leans;Bed level   Toilet Transfer: Moderate assistance;+2 for physical assistance Toilet Transfer Details (indicate cue type and reason): simluated lateral scoot on EOB         Functional mobility during ADLs: Moderate assistance;+2 for physical assistance;Cueing for sequencing;Cueing for safety       Vision   Vision Assessment?: No apparent visual deficits     Perception         Praxis         Pertinent Vitals/Pain Pain Assessment Pain Assessment: 0-10 Pain Score: 5  Pain Location: Chest Pain Descriptors / Indicators: Discomfort Pain Intervention(s): Limited activity within patient's tolerance, Monitored during session, Repositioned     Extremity/Trunk Assessment Upper Extremity Assessment Upper Extremity Assessment: Overall WFL for tasks assessed   Lower Extremity Assessment Lower Extremity Assessment: Defer to PT evaluation   Cervical / Trunk Assessment Cervical / Trunk Assessment: Other exceptions Cervical / Trunk Exceptions: increased body habitus   Communication Communication Communication: No apparent difficulties   Cognition Arousal: Alert Behavior During Therapy: Impulsive Cognition: Cognition impaired     Awareness: Online awareness impaired   Attention impairment (select first level of impairment): Selective attention Executive functioning impairment (select all  impairments): Sequencing, Problem solving, Organization, Reasoning OT - Cognition Comments: patient oriented, some impulsivity and decreased safety awareness due to deficits. difficulty sequencing lateral scoot techniques and requires cueing.                 Following commands: Impaired Following commands impaired: Follows one step commands with increased time, Follows multi-step commands inconsistently     Cueing  General Comments   Cueing Techniques: Verbal cues;Tactile cues;Visual cues  pt on RA upon entry, SpO2 85%. Donned 4L (which was on his forehead) and SPo2 91%.   Exercises     Shoulder Instructions      Home Living Family/patient expects to be discharged to:: Private residence Living Arrangements: Spouse/significant other Available Help at Discharge: Family;Available 24 hours/day (Wife 24/7) Type of Home: House Home Access: Stairs to enter [employer] of Steps: 3-5 Entrance Stairs-Rails: Right;Left;Can reach both (no rails on the front porch, but the other two options have rail(s)) Home Layout: One level     Bathroom Shower/Tub: Producer, Television/film/video: Standard Bathroom Accessibility: Yes   Home Equipment: [employer]ant (2 wheels);Cane - single point;BSC/3in1;Grab bars - toilet;Grab bars - tub/shower;Wheelchair - manual;Shower seat - built in          Prior Functioning/Environment Prior Level of Function : Independent/Modified Independent;History of Falls (last six months)             Mobility Comments: Ambulates using SPC. He reports primarily using a wall or  furniture for support within the house. Occasionally uses RW. Reports falling about every day for the past 2 years. ADLs Comments: Indep with ADLs. Manages his meds. Drives. Completes household chores.    OT Problem List: Decreased strength;Decreased activity tolerance;Impaired balance (sitting and/or standing);Obesity;Impaired sensation;Decreased knowledge of  precautions;Decreased knowledge of use of DME or AE;Decreased safety awareness   OT Treatment/Interventions: Self-care/ADL training;Therapeutic exercise;DME and/or AE instruction;Therapeutic activities;Cognitive remediation/compensation;Patient/family education;Balance training;Energy conservation      OT Goals(Current goals can be found in the care plan section)   Acute Rehab OT Goals Patient Stated Goal: walk OT Goal Formulation: With patient Time For Goal Achievement: 11/05/24 Potential to Achieve Goals: Fair   OT Frequency:  Min 2X/week    Co-evaluation PT/OT/SLP Co-Evaluation/Treatment: Yes Reason for Co-Treatment: For patient/therapist safety;To address functional/ADL transfers;Complexity of the patient's impairments (multi-system involvement) PT goals addressed during session: Mobility/safety with mobility;Balance OT goals addressed during session: ADL's and self-care      AM-PAC OT 6 Clicks Daily Activity     Outcome Measure Help from another person eating meals?: A Little Help from another person taking care of personal grooming?: A Little Help from another person toileting, which includes using toliet, bedpan, or urinal?: Total Help from another person bathing (including washing, rinsing, drying)?: A Lot Help from another person to put on and taking off regular upper body clothing?: A Lot Help from another person to put on and taking off regular lower body clothing?: Total 6 Click Score: 12   End of Session Equipment Utilized During Treatment: Oxygen  Nurse Communication: Mobility status;Precautions  Activity Tolerance: Patient tolerated treatment well Patient left: in bed;with call bell/phone within reach;with bed alarm set;Other (comment) (NP present)  OT Visit Diagnosis: Other abnormalities of gait and mobility (R26.89);Muscle weakness (generalized) (M62.81);Other symptoms and signs involving the nervous system (R29.898);History of falling (Z91.81)                 Time: 8894-8864 OT Time Calculation (min): 30 min Charges:  OT General Charges $OT Visit: 1 Visit OT Evaluation $OT Eval Moderate Complexity: 1 Mod  Etta NOVAK, OT Acute Rehabilitation Services Office [phone] Secure Chat Preferred    Etta GORMAN Hope 10/22/2024, 12:57 PM

## 2024-10-22 NOTE — Progress Notes (Signed)
 Same-day note.  Patient had instantaneous onset of lower extremity weakness, it is incomplete but given the cadence of onset, I do think spinal infarct could be a consideration.  He did have an episode similar to this associated with a rash many years ago, and HHV-6 can cause a myelitis with reactivation, though this is usually in the setting of immune suppression.  I would expect IgG index could be elevated whether this is an infectious myelitis or an inflammatory one, and so think that this will be less likely to be helpful.  If it is normal, ischemia could be a higher consideration.  I think he would benefit from repeat MRI in a few days in the case this is spinal cord infarct, and in the meantime I agree with covering with ganciclovir  as this can cause myelitis even in the absence of imaging changes.  Confirmatory testing is being pursued.  Neurology will continue to follow.
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
## 2024-10-22 NOTE — Evaluation (Signed)
 Physical Therapy Evaluation Patient Details Name: Benjamin [name] MRN: [id number] DOB: [birth year] Today's Date: 10/22/2024  History of Present Illness  Pt is a 84 y.o. M who presented to ED 10/20/24 c/o sudden development of paraplegia and numbness in the legs 3 days ago as well as back pain. He reports a similar episode ~14 years ago associated with a rash on his back which was treated with steroids with resolution of weakness after about 2 weeks. CTH (-) acute intracranial abnormality or skull fracture. Lumbar x-ray demonstrated right subarticular disc protrusion at L2-L3 resulting in lateral recess narrowing and contact with the traversing right L3 nerve root as well as mild spinal canal stenosis at L4-L5. Hip x-ray showed symmetrical bilateral hip osteoarthritis and no acute fracture. 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 PMHx: HLD, CAD s/p CABG, COPD, BPH, diastolic CHF, GERD, DDD, shingles, and chronic respiratory failure with hypoxia (not compliant with home oxygen -4L).   Clinical Impression  Pt admitted with above diagnosis. PTA, pt was modI for functional mobility furniture/wall walking within his home and using SPC in the community. He lives with his wife in a one story house with 3-5 STE. Pt currently with functional limitations due to the deficits listed below (see PT Problem List). He required min-modA x1-2 for bed mobility and modA x2 for laterally scooting EOB. Pt is currently limited by BLE deficits including proximal BLE paresis (grossly 2/5 at hip and knee) and distal BLE plegia (grossly 0/5 at ankle), decreased sensation inferior to the knee, impaired proprioception/coordination; decreased safety awareness, impulsivity with movement, and impaired balance. Pt will benefit from acute skilled PT to increase his independence and safety with mobility to allow discharge. Given the significant change from baseline recommend intensive inpatient follow-up  therapy, >3 hours/day to optimize independence, safety, and return towards PLOF.    If plan is discharge home, recommend the following: Two people to help with walking and/or transfers;A lot of help with bathing/dressing/bathroom;Assistance with cooking/housework;Assist for transportation;Help with stairs or ramp for entrance   Can travel by private vehicle        Equipment Recommendations Other (comment) (sliding board)  Recommendations for Other Services  Rehab consult    Functional Status Assessment Patient has had a recent decline in their functional status and demonstrates the ability to make significant improvements in function in a reasonable and predictable amount of time.     Precautions / Restrictions Precautions Precautions: Fall Recall of Precautions/Restrictions: Impaired Precaution/Restrictions Comments: watch O2 Restrictions Weight Bearing Restrictions Per Provider Order: No      Mobility  Bed Mobility Overal bed mobility: Needs Assistance Bed Mobility: Rolling, Sidelying to Sit, Sit to Sidelying Rolling: Min assist, Used rails Sidelying to sit: Mod assist, Used rails     Sit to sidelying: Mod assist, +2 for physical assistance, Used rails General bed mobility comments: Pt sat up on R side of bed with increased time. Assist to manage BLE. Cues for sequencing and increase safety awareness. Support to maintain balance.    Transfers Overall transfer level: Needs assistance Equipment used: None              Lateral/Scoot Transfers: Mod assist, +2 physical assistance General transfer comment: Pt laterally scooted alone EOB in both directions. Educated pt on head-hips relationship. Cued proper sequencing and technique. +2 assist for support, stability, and safety.    Ambulation/Gait               General Gait Details:  NT  Stairs            Wheelchair Mobility     Tilt Bed    Modified Rankin (Stroke Patients Only)       Balance  Overall balance assessment: Needs assistance, History of Falls Sitting-balance support: No upper extremity supported, Feet supported, Bilateral upper extremity supported Sitting balance-Leahy Scale: Poor Sitting balance - Comments: relies on BUE support, brief moments of no UE support statically with CGA but overall needing up to max assist dynamically. Postural control: Other (comment) (all directions)     Standing balance comment: NT                             Pertinent Vitals/Pain Pain Assessment Pain Assessment: 0-10 Pain Score: 5  Pain Location: Chest; R hip/groin and posterior R knee Pain Descriptors / Indicators: Discomfort Pain Intervention(s): Monitored during session, Limited activity within patient's tolerance, Repositioned    Home Living Family/patient expects to be discharged to:: Private residence Living Arrangements: Spouse/significant other Available Help at Discharge: Family;Available 24 hours/day (Wife 24/7, but limited ability to physically assist.) Type of Home: House Home Access: Stairs to enter Entrance Stairs-Rails: Right;Left;Can reach both (no rails on the front porch, but the other two options have rail(s)) Entrance Stairs-Number of Steps: 3-5   Home Layout: One level Home Equipment: [employer] (2 wheels);Cane - single point;BSC/3in1;Grab bars - toilet;Grab bars - tub/shower;Wheelchair - manual;Shower seat - built in      Prior Function Prior Level of Function : Independent/Modified Independent;History of Falls (last six months)             Mobility Comments: Ambulates using SPC. He reports primarily using a wall or furniture for support within the house. Occasionally uses RW. Reports falling about every day for the past 2 years. ADLs Comments: Indep with ADLs. Manages his meds. Drives. Completes household chores.     Extremity/Trunk Assessment   Upper Extremity Assessment Upper Extremity Assessment: Defer to OT evaluation     Lower Extremity Assessment Lower Extremity Assessment: RLE deficits/detail;LLE deficits/detail RLE Deficits / Details: PROM WFL. No active movement observed below the knee. Pt demonstrated limited AROM above the knee including small hip ABD/ADD, rotation, quad set, and some knee flex. He compensated primarily at the hip, heavy reliance on BUE support on railings, and straining noted. Grossly 2/5 at hip and knee. No palpable contraction at/around ankle. RLE Sensation: decreased light touch;decreased proprioception (loss of sensation below his knee, intact above the knee) RLE Coordination: decreased gross motor LLE Deficits / Details: PROM WFL. No active movement observed below the knee. Pt demonstrated limited AROM above the knee including small hip ABD/ADD, rotation, quad set, and some knee flex. He compensated primarily at the hip, heavy reliance on BUE support on railings, and straining noted. Grossly 2/5 at hip and knee. No palpable contraction at/around ankle. LLE Sensation: decreased light touch;decreased proprioception (loss of sensation below his knee, intact above the knee) LLE Coordination: decreased gross motor    Cervical / Trunk Assessment Cervical / Trunk Assessment: Other exceptions Cervical / Trunk Exceptions: increased body habitus  Communication   Communication Communication: No apparent difficulties    Cognition Arousal: Alert Behavior During Therapy: Impulsive   PT - Cognitive impairments: No family/caregiver present to determine baseline, Sequencing, Problem solving, Safety/Judgement                       PT - Cognition Comments:  Pt A,Ox4. He appears to have a joking personality and would only respond to very direct/explicit questions. Some impulsivity with movement d/t decreased awareness of deficits and insight into condition. Limited safety awareness. Difficulty comprehending and following technique/strategies despite demonstration and repeated  cues. Following commands: Impaired Following commands impaired: Follows one step commands with increased time, Follows multi-step commands inconsistently     Cueing Cueing Techniques: Verbal cues, Tactile cues, Visual cues     General Comments General comments (skin integrity, edema, etc.): Pt with [place] on his forehead upon entering room, SpO2 85%. Adjusted [place] properly, which was on 4L and SpO2 increased to 91%.    Exercises     Assessment/Plan    PT Assessment Patient needs continued PT services  PT Problem List Decreased strength;Decreased activity tolerance;Decreased balance;Decreased mobility;Decreased knowledge of use of DME;Decreased safety awareness;Decreased knowledge of precautions;Impaired sensation       PT Treatment Interventions DME instruction;Gait training;Stair training;Functional mobility training;Therapeutic activities;Therapeutic exercise;Balance training;Neuromuscular re-education;Patient/family education;Wheelchair mobility training    PT Goals (Current goals can be found in the Care Plan section)  Acute Rehab PT Goals Patient Stated Goal: Get back to walking PT Goal Formulation: With patient Time For Goal Achievement: 11/05/24 Potential to Achieve Goals: Fair    Frequency Min 3X/week     Co-evaluation   Reason for Co-Treatment: For patient/therapist safety;To address functional/ADL transfers;Complexity of the patient's impairments (multi-system involvement) PT goals addressed during session: Mobility/safety with mobility;Balance OT goals addressed during session: ADL's and self-care       AM-PAC PT 6 Clicks Mobility  Outcome Measure Help needed turning from your back to your side while in a flat bed without using bedrails?: A Little Help needed moving from lying on your back to sitting on the side of a flat bed without using bedrails?: Total Help needed moving to and from a bed to a chair (including a wheelchair)?: Total Help needed standing up from  a chair using your arms (e.g., wheelchair or bedside chair)?: Total Help needed to walk in hospital room?: Total Help needed climbing 3-5 steps with a railing? : Total 6 Click Score: 8    End of Session   Activity Tolerance: Patient tolerated treatment well Patient left: in bed;with call bell/phone within reach;with bed alarm set Nurse Communication: Mobility status;Need for lift equipment PT Visit Diagnosis: Muscle weakness (generalized) (M62.81);Difficulty in walking, not elsewhere classified (R26.2);Other abnormalities of gait and mobility (R26.89);Unsteadiness on feet (R26.81);History of falling (Z91.81)    Time: 8894-8862 PT Time Calculation (min) (ACUTE ONLY): 32 min   Charges:   PT Evaluation $PT Eval Moderate Complexity: 1 Mod   PT General Charges $$ ACUTE PT VISIT: 1 Visit         Randall SAUNDERS, PT, DPT Acute Rehabilitation Services Office: [phone] Secure Chat Preferred  Benjamin [name] 10/22/2024, 1:28 PM

## 2024-10-22 NOTE — Progress Notes (Signed)
  Inpatient Rehab Admissions Coordinator :  Per therapy recommendations, patient was screened for CIR candidacy by Ottie Glazier RN MSN.  At this time patient appears to be a potential candidate for CIR. I will place a rehab consult per protocol for full assessment. Please call me with any questions.  Ottie Glazier RN MSN Admissions Coordinator [phone]

## 2024-10-25 ENCOUNTER — Ambulatory Visit (HOSPITAL_COMMUNITY)

## 2024-10-27 ENCOUNTER — Ambulatory Visit (HOSPITAL_COMMUNITY)

## 2024-10-27 ENCOUNTER — Ambulatory Visit

## 2024-10-29 ENCOUNTER — Ambulatory Visit (HOSPITAL_COMMUNITY)

## 2024-11-01 ENCOUNTER — Ambulatory Visit (HOSPITAL_COMMUNITY)

## 2024-11-03 ENCOUNTER — Ambulatory Visit (HOSPITAL_COMMUNITY)

## 2024-11-05 ENCOUNTER — Ambulatory Visit: Admitting: Allergy & Immunology

## 2024-11-30 ENCOUNTER — Ambulatory Visit: Admitting: Internal Medicine

## 2024-12-31 ENCOUNTER — Ambulatory Visit: Admitting: Diagnostic Neuroimaging
